# Patient Record
Sex: Female | Born: 1935
Health system: Southern US, Community
[De-identification: ages and names within clinical notes are randomized; demographics above are authoritative.]

## PROBLEM LIST (undated history)

## (undated) DIAGNOSIS — I4891 Unspecified atrial fibrillation: Secondary | ICD-10-CM

## (undated) DIAGNOSIS — I10 Essential (primary) hypertension: Secondary | ICD-10-CM

## (undated) DIAGNOSIS — R079 Chest pain, unspecified: Secondary | ICD-10-CM

## (undated) DIAGNOSIS — I071 Rheumatic tricuspid insufficiency: Secondary | ICD-10-CM

## (undated) DIAGNOSIS — M1990 Unspecified osteoarthritis, unspecified site: Secondary | ICD-10-CM

## (undated) DIAGNOSIS — I493 Ventricular premature depolarization: Secondary | ICD-10-CM

## (undated) DIAGNOSIS — G4733 Obstructive sleep apnea (adult) (pediatric): Secondary | ICD-10-CM

## (undated) DIAGNOSIS — E785 Hyperlipidemia, unspecified: Secondary | ICD-10-CM

## (undated) DIAGNOSIS — Z972 Presence of dental prosthetic device (complete) (partial): Secondary | ICD-10-CM

## (undated) DIAGNOSIS — E119 Type 2 diabetes mellitus without complications: Secondary | ICD-10-CM

## (undated) DIAGNOSIS — I5032 Chronic diastolic (congestive) heart failure: Secondary | ICD-10-CM

## (undated) DIAGNOSIS — I480 Paroxysmal atrial fibrillation: Secondary | ICD-10-CM

## (undated) DIAGNOSIS — I495 Sick sinus syndrome: Secondary | ICD-10-CM

## (undated) DIAGNOSIS — Z8719 Personal history of other diseases of the digestive system: Secondary | ICD-10-CM

## (undated) DIAGNOSIS — I272 Pulmonary hypertension, unspecified: Secondary | ICD-10-CM

## (undated) DIAGNOSIS — Z87440 Personal history of urinary (tract) infections: Secondary | ICD-10-CM

## (undated) DIAGNOSIS — F419 Anxiety disorder, unspecified: Secondary | ICD-10-CM

## (undated) DIAGNOSIS — E079 Disorder of thyroid, unspecified: Secondary | ICD-10-CM

## (undated) DIAGNOSIS — R55 Syncope and collapse: Secondary | ICD-10-CM

## (undated) DIAGNOSIS — R42 Dizziness and giddiness: Secondary | ICD-10-CM

## (undated) DIAGNOSIS — K219 Gastro-esophageal reflux disease without esophagitis: Secondary | ICD-10-CM

## (undated) DIAGNOSIS — H409 Unspecified glaucoma: Secondary | ICD-10-CM

## (undated) DIAGNOSIS — M109 Gout, unspecified: Secondary | ICD-10-CM

## (undated) DIAGNOSIS — I340 Nonrheumatic mitral (valve) insufficiency: Secondary | ICD-10-CM

## (undated) DIAGNOSIS — K635 Polyp of colon: Secondary | ICD-10-CM

## (undated) DIAGNOSIS — N184 Chronic kidney disease, stage 4 (severe): Secondary | ICD-10-CM

## (undated) DIAGNOSIS — I440 Atrioventricular block, first degree: Secondary | ICD-10-CM

## (undated) DIAGNOSIS — D649 Anemia, unspecified: Secondary | ICD-10-CM

## (undated) DIAGNOSIS — J302 Other seasonal allergic rhinitis: Secondary | ICD-10-CM

## (undated) DIAGNOSIS — Z950 Presence of cardiac pacemaker: Secondary | ICD-10-CM

## (undated) DIAGNOSIS — N189 Chronic kidney disease, unspecified: Secondary | ICD-10-CM

## (undated) DIAGNOSIS — I509 Heart failure, unspecified: Secondary | ICD-10-CM

## (undated) DIAGNOSIS — Z9989 Dependence on other enabling machines and devices: Secondary | ICD-10-CM

## (undated) HISTORY — PX: INSERT / REPLACE / REMOVE PACEMAKER: SUR710

## (undated) HISTORY — DX: Ventricular premature depolarization: I49.3

## (undated) HISTORY — DX: Sick sinus syndrome: I49.5

## (undated) HISTORY — DX: Polyp of colon: K63.5

## (undated) HISTORY — DX: Morbid (severe) obesity due to excess calories: E66.01

## (undated) HISTORY — DX: Unspecified glaucoma: H40.9

## (undated) HISTORY — DX: Disorder of thyroid, unspecified: E07.9

## (undated) HISTORY — PX: APPENDECTOMY: SHX54

## (undated) HISTORY — DX: Paroxysmal atrial fibrillation: I48.0

## (undated) HISTORY — DX: Gastro-esophageal reflux disease without esophagitis: K21.9

## (undated) HISTORY — DX: Atrioventricular block, first degree: I44.0

## (undated) HISTORY — DX: Chest pain, unspecified: R07.9

## (undated) HISTORY — DX: Heart failure, unspecified: I50.9

## (undated) HISTORY — DX: Syncope and collapse: R55

## (undated) HISTORY — DX: Hyperlipidemia, unspecified: E78.5

## (undated) HISTORY — DX: Chronic diastolic (congestive) heart failure: I50.32

## (undated) HISTORY — DX: Chronic kidney disease, unspecified: N18.9

## (undated) HISTORY — PX: DILATION AND CURETTAGE OF UTERUS: SHX78

## (undated) HISTORY — DX: Personal history of urinary (tract) infections: Z87.440

## (undated) HISTORY — PX: CARDIAC CATHETERIZATION: SHX172

## (undated) HISTORY — DX: Essential (primary) hypertension: I10

---

## 1968-12-04 HISTORY — PX: VAGINAL HYSTERECTOMY: SUR661

## 1998-03-04 HISTORY — PX: INCISION AND DRAINAGE OF WOUND: SHX1803

## 2004-07-24 ENCOUNTER — Other Ambulatory Visit: Payer: Self-pay

## 2005-01-02 ENCOUNTER — Ambulatory Visit: Payer: Self-pay | Admitting: Unknown Physician Specialty

## 2005-04-11 ENCOUNTER — Ambulatory Visit: Payer: Self-pay | Admitting: Family Medicine

## 2005-10-13 ENCOUNTER — Ambulatory Visit: Payer: Self-pay | Admitting: Unknown Physician Specialty

## 2005-10-20 ENCOUNTER — Ambulatory Visit: Payer: Self-pay | Admitting: Unknown Physician Specialty

## 2005-11-03 ENCOUNTER — Ambulatory Visit: Payer: Self-pay | Admitting: Family Medicine

## 2005-11-16 ENCOUNTER — Ambulatory Visit: Payer: Self-pay | Admitting: Internal Medicine

## 2005-12-04 ENCOUNTER — Ambulatory Visit: Payer: Self-pay | Admitting: Internal Medicine

## 2005-12-18 ENCOUNTER — Ambulatory Visit: Payer: Self-pay | Admitting: Family Medicine

## 2005-12-26 ENCOUNTER — Ambulatory Visit: Payer: Self-pay | Admitting: Family Medicine

## 2006-01-08 ENCOUNTER — Ambulatory Visit: Payer: Self-pay | Admitting: Internal Medicine

## 2006-02-01 ENCOUNTER — Ambulatory Visit: Payer: Self-pay | Admitting: Internal Medicine

## 2006-03-04 ENCOUNTER — Ambulatory Visit: Payer: Self-pay | Admitting: Internal Medicine

## 2006-04-09 ENCOUNTER — Ambulatory Visit: Payer: Self-pay | Admitting: Internal Medicine

## 2006-05-03 ENCOUNTER — Ambulatory Visit: Payer: Self-pay | Admitting: Family Medicine

## 2006-05-21 ENCOUNTER — Ambulatory Visit: Payer: Self-pay | Admitting: Specialist

## 2006-06-04 ENCOUNTER — Ambulatory Visit: Payer: Self-pay | Admitting: Internal Medicine

## 2006-06-11 ENCOUNTER — Ambulatory Visit: Payer: Self-pay | Admitting: Internal Medicine

## 2006-06-15 ENCOUNTER — Ambulatory Visit: Payer: Self-pay | Admitting: Internal Medicine

## 2006-06-26 ENCOUNTER — Ambulatory Visit: Payer: Self-pay | Admitting: Internal Medicine

## 2006-07-04 ENCOUNTER — Ambulatory Visit: Payer: Self-pay | Admitting: Internal Medicine

## 2006-07-05 ENCOUNTER — Other Ambulatory Visit: Payer: Self-pay

## 2006-07-05 ENCOUNTER — Emergency Department: Payer: Self-pay | Admitting: Emergency Medicine

## 2006-08-12 ENCOUNTER — Emergency Department: Payer: Self-pay | Admitting: Emergency Medicine

## 2006-08-23 ENCOUNTER — Ambulatory Visit: Payer: Self-pay | Admitting: Specialist

## 2006-08-29 ENCOUNTER — Ambulatory Visit: Payer: Self-pay | Admitting: Internal Medicine

## 2006-09-03 ENCOUNTER — Ambulatory Visit: Payer: Self-pay | Admitting: Internal Medicine

## 2006-11-13 ENCOUNTER — Ambulatory Visit: Payer: Self-pay

## 2006-12-18 ENCOUNTER — Ambulatory Visit: Payer: Self-pay | Admitting: Unknown Physician Specialty

## 2006-12-20 ENCOUNTER — Inpatient Hospital Stay: Payer: Self-pay | Admitting: *Deleted

## 2006-12-20 ENCOUNTER — Other Ambulatory Visit: Payer: Self-pay

## 2006-12-21 ENCOUNTER — Other Ambulatory Visit: Payer: Self-pay

## 2007-02-14 ENCOUNTER — Encounter (HOSPITAL_BASED_OUTPATIENT_CLINIC_OR_DEPARTMENT_OTHER): Admission: RE | Admit: 2007-02-14 | Discharge: 2007-05-15 | Payer: Self-pay | Admitting: Surgery

## 2007-02-25 ENCOUNTER — Ambulatory Visit: Payer: Self-pay | Admitting: Internal Medicine

## 2007-02-26 ENCOUNTER — Ambulatory Visit: Payer: Self-pay | Admitting: Internal Medicine

## 2007-03-05 ENCOUNTER — Ambulatory Visit: Payer: Self-pay | Admitting: Internal Medicine

## 2007-06-27 ENCOUNTER — Ambulatory Visit: Payer: Self-pay | Admitting: Family Medicine

## 2007-08-05 ENCOUNTER — Ambulatory Visit: Payer: Self-pay | Admitting: Internal Medicine

## 2007-08-26 ENCOUNTER — Ambulatory Visit: Payer: Self-pay | Admitting: Internal Medicine

## 2007-09-04 ENCOUNTER — Ambulatory Visit: Payer: Self-pay | Admitting: Internal Medicine

## 2008-01-07 ENCOUNTER — Emergency Department: Payer: Self-pay | Admitting: Emergency Medicine

## 2008-01-07 ENCOUNTER — Other Ambulatory Visit: Payer: Self-pay

## 2008-02-25 ENCOUNTER — Ambulatory Visit: Payer: Self-pay | Admitting: Internal Medicine

## 2008-05-25 ENCOUNTER — Emergency Department: Payer: Self-pay | Admitting: Emergency Medicine

## 2008-05-25 ENCOUNTER — Other Ambulatory Visit: Payer: Self-pay

## 2008-05-27 ENCOUNTER — Ambulatory Visit: Payer: Self-pay | Admitting: Internal Medicine

## 2008-06-01 ENCOUNTER — Ambulatory Visit: Payer: Self-pay | Admitting: Internal Medicine

## 2008-06-03 ENCOUNTER — Ambulatory Visit: Payer: Self-pay | Admitting: Internal Medicine

## 2008-06-09 ENCOUNTER — Ambulatory Visit: Payer: Self-pay | Admitting: Internal Medicine

## 2008-06-17 ENCOUNTER — Ambulatory Visit: Payer: Self-pay | Admitting: Internal Medicine

## 2008-07-04 ENCOUNTER — Ambulatory Visit: Payer: Self-pay | Admitting: Internal Medicine

## 2008-07-16 ENCOUNTER — Ambulatory Visit: Payer: Self-pay | Admitting: Family Medicine

## 2008-08-04 ENCOUNTER — Ambulatory Visit: Payer: Self-pay | Admitting: Internal Medicine

## 2008-08-05 ENCOUNTER — Ambulatory Visit: Payer: Self-pay | Admitting: Unknown Physician Specialty

## 2008-09-03 ENCOUNTER — Ambulatory Visit: Payer: Self-pay | Admitting: Internal Medicine

## 2008-09-07 ENCOUNTER — Ambulatory Visit: Payer: Self-pay | Admitting: Internal Medicine

## 2008-09-09 ENCOUNTER — Ambulatory Visit: Payer: Self-pay | Admitting: Cardiovascular Disease

## 2008-09-21 ENCOUNTER — Ambulatory Visit: Payer: Self-pay | Admitting: Internal Medicine

## 2008-10-04 ENCOUNTER — Ambulatory Visit: Payer: Self-pay | Admitting: Internal Medicine

## 2008-10-05 ENCOUNTER — Ambulatory Visit: Payer: Self-pay | Admitting: Cardiovascular Disease

## 2008-10-19 ENCOUNTER — Ambulatory Visit: Payer: Self-pay | Admitting: Internal Medicine

## 2008-11-03 ENCOUNTER — Ambulatory Visit: Payer: Self-pay | Admitting: Internal Medicine

## 2008-11-13 ENCOUNTER — Emergency Department: Payer: Self-pay | Admitting: Emergency Medicine

## 2008-11-16 ENCOUNTER — Ambulatory Visit: Payer: Self-pay | Admitting: Internal Medicine

## 2008-11-18 ENCOUNTER — Ambulatory Visit: Payer: Self-pay | Admitting: Internal Medicine

## 2008-12-04 HISTORY — PX: LUNG BIOPSY: SHX232

## 2008-12-07 ENCOUNTER — Ambulatory Visit: Payer: Self-pay | Admitting: Internal Medicine

## 2008-12-07 LAB — CONVERTED CEMR LAB
INR: 2.5 — ABNORMAL HIGH (ref 0.0–1.5)
Prothrombin Time: 29 s — ABNORMAL HIGH (ref 11.6–15.2)

## 2009-01-05 ENCOUNTER — Ambulatory Visit: Payer: Self-pay | Admitting: Internal Medicine

## 2009-01-05 LAB — CONVERTED CEMR LAB
INR: 3.7 — ABNORMAL HIGH (ref 0.0–1.5)
Prothrombin Time: 39.5 s — ABNORMAL HIGH (ref 11.6–15.2)

## 2009-01-25 ENCOUNTER — Ambulatory Visit: Payer: Self-pay | Admitting: Internal Medicine

## 2009-01-29 ENCOUNTER — Encounter: Payer: Self-pay | Admitting: Internal Medicine

## 2009-02-01 ENCOUNTER — Ambulatory Visit: Payer: Self-pay | Admitting: Internal Medicine

## 2009-02-01 LAB — CONVERTED CEMR LAB
INR: 1.9 — ABNORMAL HIGH (ref 0.0–1.5)
Prothrombin Time: 22.9 s — ABNORMAL HIGH (ref 11.6–15.2)

## 2009-02-15 ENCOUNTER — Ambulatory Visit: Payer: Self-pay | Admitting: Internal Medicine

## 2009-02-15 LAB — CONVERTED CEMR LAB
INR: 2.7 — ABNORMAL HIGH (ref 0.0–1.5)
Prothrombin Time: 30.7 s — ABNORMAL HIGH (ref 11.6–15.2)

## 2009-03-15 ENCOUNTER — Ambulatory Visit: Payer: Self-pay | Admitting: Internal Medicine

## 2009-03-15 LAB — CONVERTED CEMR LAB
INR: 2.1 — ABNORMAL HIGH (ref 0.0–1.5)
Prothrombin Time: 25 s — ABNORMAL HIGH (ref 11.6–15.2)

## 2009-04-12 ENCOUNTER — Ambulatory Visit: Payer: Self-pay | Admitting: Cardiovascular Disease

## 2009-04-13 ENCOUNTER — Ambulatory Visit: Payer: Self-pay | Admitting: Cardiovascular Disease

## 2009-04-13 DIAGNOSIS — E1129 Type 2 diabetes mellitus with other diabetic kidney complication: Secondary | ICD-10-CM | POA: Insufficient documentation

## 2009-04-13 DIAGNOSIS — E119 Type 2 diabetes mellitus without complications: Secondary | ICD-10-CM | POA: Insufficient documentation

## 2009-04-13 DIAGNOSIS — K219 Gastro-esophageal reflux disease without esophagitis: Secondary | ICD-10-CM | POA: Insufficient documentation

## 2009-04-13 DIAGNOSIS — I10 Essential (primary) hypertension: Secondary | ICD-10-CM | POA: Insufficient documentation

## 2009-04-13 DIAGNOSIS — I480 Paroxysmal atrial fibrillation: Secondary | ICD-10-CM | POA: Insufficient documentation

## 2009-04-13 DIAGNOSIS — H409 Unspecified glaucoma: Secondary | ICD-10-CM | POA: Insufficient documentation

## 2009-04-13 DIAGNOSIS — I495 Sick sinus syndrome: Secondary | ICD-10-CM | POA: Insufficient documentation

## 2009-04-13 DIAGNOSIS — Z950 Presence of cardiac pacemaker: Secondary | ICD-10-CM | POA: Insufficient documentation

## 2009-04-13 LAB — CONVERTED CEMR LAB
INR: 2.2 — ABNORMAL HIGH (ref 0.0–1.5)
Prothrombin Time: 25.4 s — ABNORMAL HIGH (ref 11.6–15.2)

## 2009-04-19 ENCOUNTER — Ambulatory Visit: Payer: Self-pay | Admitting: Cardiovascular Disease

## 2009-04-28 ENCOUNTER — Telehealth: Payer: Self-pay | Admitting: Internal Medicine

## 2009-05-11 ENCOUNTER — Ambulatory Visit: Payer: Self-pay | Admitting: Cardiology

## 2009-05-12 LAB — CONVERTED CEMR LAB
INR: 2.1 — ABNORMAL HIGH (ref 0.0–1.5)
Prothrombin Time: 25.2 s — ABNORMAL HIGH (ref 11.6–15.2)

## 2009-05-21 ENCOUNTER — Telehealth: Payer: Self-pay | Admitting: Internal Medicine

## 2009-05-27 ENCOUNTER — Telehealth: Payer: Self-pay | Admitting: Internal Medicine

## 2009-05-31 ENCOUNTER — Encounter: Payer: Self-pay | Admitting: Internal Medicine

## 2009-05-31 ENCOUNTER — Ambulatory Visit: Payer: Self-pay | Admitting: Internal Medicine

## 2009-06-08 ENCOUNTER — Ambulatory Visit: Payer: Self-pay | Admitting: Cardiovascular Disease

## 2009-06-10 LAB — CONVERTED CEMR LAB
INR: 2.8 — ABNORMAL HIGH (ref 0.0–1.5)
Prothrombin Time: 31.6 s — ABNORMAL HIGH (ref 11.6–15.2)

## 2009-07-05 ENCOUNTER — Telehealth: Payer: Self-pay | Admitting: Internal Medicine

## 2009-07-06 ENCOUNTER — Ambulatory Visit: Payer: Self-pay | Admitting: Cardiology

## 2009-07-07 LAB — CONVERTED CEMR LAB
INR: 2.8 — ABNORMAL HIGH (ref 0.0–1.5)
Prothrombin Time: 31.8 s — ABNORMAL HIGH (ref 11.6–15.2)

## 2009-07-19 ENCOUNTER — Encounter: Payer: Self-pay | Admitting: *Deleted

## 2009-08-03 ENCOUNTER — Ambulatory Visit: Payer: Self-pay | Admitting: Cardiology

## 2009-08-04 ENCOUNTER — Encounter: Payer: Self-pay | Admitting: Internal Medicine

## 2009-08-04 LAB — CONVERTED CEMR LAB
INR: 2.9
Prothrombin Time: 29.9 s

## 2009-08-06 LAB — CONVERTED CEMR LAB
INR: 2.9 — ABNORMAL HIGH (ref 0.0–1.5)
Prothrombin Time: 29.9 s — ABNORMAL HIGH (ref 11.6–15.2)

## 2009-08-10 ENCOUNTER — Ambulatory Visit: Payer: Self-pay | Admitting: Family Medicine

## 2009-08-23 ENCOUNTER — Ambulatory Visit: Payer: Self-pay | Admitting: Internal Medicine

## 2009-08-23 DIAGNOSIS — R609 Edema, unspecified: Secondary | ICD-10-CM | POA: Insufficient documentation

## 2009-08-28 ENCOUNTER — Emergency Department: Payer: Self-pay | Admitting: Internal Medicine

## 2009-08-30 ENCOUNTER — Emergency Department: Payer: Self-pay | Admitting: Emergency Medicine

## 2009-08-31 ENCOUNTER — Ambulatory Visit: Payer: Self-pay | Admitting: Cardiovascular Disease

## 2009-08-31 LAB — CONVERTED CEMR LAB
INR: 3.3
Prothrombin Time: 33.3 s

## 2009-09-01 LAB — CONVERTED CEMR LAB
INR: 3.3 — ABNORMAL HIGH (ref 0.0–1.5)
Prothrombin Time: 33.3 s — ABNORMAL HIGH (ref 11.6–15.2)

## 2009-09-13 ENCOUNTER — Ambulatory Visit: Payer: Self-pay | Admitting: Internal Medicine

## 2009-09-13 DIAGNOSIS — I4891 Unspecified atrial fibrillation: Secondary | ICD-10-CM | POA: Insufficient documentation

## 2009-09-13 DIAGNOSIS — IMO0002 Reserved for concepts with insufficient information to code with codable children: Secondary | ICD-10-CM | POA: Insufficient documentation

## 2009-09-13 DIAGNOSIS — I493 Ventricular premature depolarization: Secondary | ICD-10-CM | POA: Insufficient documentation

## 2009-09-28 ENCOUNTER — Ambulatory Visit: Payer: Self-pay | Admitting: Cardiology

## 2009-09-28 LAB — CONVERTED CEMR LAB: INR: 2.68

## 2009-09-29 LAB — CONVERTED CEMR LAB
INR: 2.68 — ABNORMAL HIGH (ref <1.50)
Prothrombin Time: 28.3 s — ABNORMAL HIGH (ref 11.6–15.2)

## 2009-11-02 ENCOUNTER — Telehealth: Payer: Self-pay | Admitting: Internal Medicine

## 2009-11-11 ENCOUNTER — Ambulatory Visit: Payer: Self-pay

## 2009-11-12 ENCOUNTER — Encounter: Payer: Self-pay | Admitting: Cardiology

## 2009-11-12 LAB — CONVERTED CEMR LAB
INR: 2.72 — ABNORMAL HIGH (ref <1.50)
INR: 2.72
Prothrombin Time: 28.6 s — ABNORMAL HIGH (ref 11.6–15.2)
Prothrombin Time: 28.6 s

## 2009-12-03 ENCOUNTER — Emergency Department: Payer: Self-pay | Admitting: Emergency Medicine

## 2009-12-09 ENCOUNTER — Ambulatory Visit: Payer: Self-pay | Admitting: Cardiology

## 2009-12-10 ENCOUNTER — Encounter: Payer: Self-pay | Admitting: Cardiology

## 2009-12-10 LAB — CONVERTED CEMR LAB
INR: 2.78 — ABNORMAL HIGH (ref <1.50)
INR: 2.78
Prothrombin Time: 29.1 s — ABNORMAL HIGH (ref 11.6–15.2)
Prothrombin Time: 29.1 s

## 2009-12-31 ENCOUNTER — Telehealth: Payer: Self-pay | Admitting: Internal Medicine

## 2010-01-05 ENCOUNTER — Ambulatory Visit: Payer: Self-pay | Admitting: Cardiovascular Disease

## 2010-01-05 LAB — CONVERTED CEMR LAB
INR: 2.42 — ABNORMAL HIGH (ref <1.50)
Prothrombin Time: 26.1 s — ABNORMAL HIGH (ref 11.6–15.2)

## 2010-01-07 ENCOUNTER — Encounter: Payer: Self-pay | Admitting: Cardiovascular Disease

## 2010-01-07 ENCOUNTER — Telehealth: Payer: Self-pay | Admitting: Internal Medicine

## 2010-01-07 LAB — CONVERTED CEMR LAB
INR: 2.42
Prothrombin Time: 26.1 s

## 2010-01-17 ENCOUNTER — Ambulatory Visit: Payer: Self-pay | Admitting: Internal Medicine

## 2010-01-25 ENCOUNTER — Ambulatory Visit: Payer: Self-pay | Admitting: Internal Medicine

## 2010-01-28 ENCOUNTER — Telehealth: Payer: Self-pay | Admitting: Cardiovascular Disease

## 2010-01-28 ENCOUNTER — Ambulatory Visit: Payer: Self-pay | Admitting: Cardiovascular Disease

## 2010-01-28 DIAGNOSIS — R0602 Shortness of breath: Secondary | ICD-10-CM | POA: Insufficient documentation

## 2010-02-01 ENCOUNTER — Telehealth: Payer: Self-pay | Admitting: Cardiovascular Disease

## 2010-02-02 ENCOUNTER — Telehealth: Payer: Self-pay | Admitting: Internal Medicine

## 2010-02-03 ENCOUNTER — Ambulatory Visit: Payer: Self-pay | Admitting: Cardiovascular Disease

## 2010-02-09 ENCOUNTER — Encounter: Payer: Self-pay | Admitting: Cardiovascular Disease

## 2010-02-09 ENCOUNTER — Ambulatory Visit: Payer: Self-pay | Admitting: Cardiovascular Disease

## 2010-02-10 ENCOUNTER — Ambulatory Visit: Payer: Self-pay | Admitting: Cardiovascular Disease

## 2010-02-11 ENCOUNTER — Encounter: Payer: Self-pay | Admitting: Internal Medicine

## 2010-02-11 LAB — CONVERTED CEMR LAB
INR: 2.79
INR: 2.79 — ABNORMAL HIGH (ref <1.50)
Prothrombin Time: 29.2 s
Prothrombin Time: 29.2 s — ABNORMAL HIGH (ref 11.6–15.2)

## 2010-03-10 ENCOUNTER — Ambulatory Visit: Payer: Self-pay | Admitting: Internal Medicine

## 2010-03-14 ENCOUNTER — Ambulatory Visit: Payer: Self-pay | Admitting: Internal Medicine

## 2010-03-14 LAB — CONVERTED CEMR LAB
INR: 2.6
INR: 2.6 — ABNORMAL HIGH (ref <1.50)
Prothrombin Time: 27.6 s
Prothrombin Time: 27.6 s — ABNORMAL HIGH (ref 11.6–15.2)

## 2010-03-15 ENCOUNTER — Ambulatory Visit: Payer: Self-pay

## 2010-03-15 ENCOUNTER — Encounter: Payer: Self-pay | Admitting: Internal Medicine

## 2010-03-23 ENCOUNTER — Telehealth: Payer: Self-pay | Admitting: Internal Medicine

## 2010-03-28 ENCOUNTER — Ambulatory Visit: Payer: Self-pay | Admitting: Internal Medicine

## 2010-03-28 ENCOUNTER — Ambulatory Visit (HOSPITAL_COMMUNITY): Admission: RE | Admit: 2010-03-28 | Discharge: 2010-03-28 | Payer: Self-pay | Admitting: Internal Medicine

## 2010-03-28 ENCOUNTER — Encounter: Payer: Self-pay | Admitting: Internal Medicine

## 2010-04-11 ENCOUNTER — Telehealth: Payer: Self-pay | Admitting: Internal Medicine

## 2010-04-13 ENCOUNTER — Ambulatory Visit: Payer: Self-pay | Admitting: Cardiovascular Disease

## 2010-04-13 LAB — CONVERTED CEMR LAB: POC INR: 2.8

## 2010-04-14 ENCOUNTER — Encounter: Payer: Self-pay | Admitting: Cardiovascular Disease

## 2010-04-25 ENCOUNTER — Ambulatory Visit: Payer: Self-pay

## 2010-04-28 ENCOUNTER — Ambulatory Visit: Payer: Self-pay | Admitting: Internal Medicine

## 2010-04-28 ENCOUNTER — Telehealth: Payer: Self-pay | Admitting: Internal Medicine

## 2010-05-03 ENCOUNTER — Encounter: Payer: Self-pay | Admitting: Cardiovascular Disease

## 2010-05-13 ENCOUNTER — Ambulatory Visit: Payer: Self-pay | Admitting: Cardiovascular Disease

## 2010-05-13 LAB — CONVERTED CEMR LAB
INR: 4.09 — ABNORMAL HIGH (ref <1.50)
POC INR: 4.09
Prothrombin Time: 39.4 s
Prothrombin Time: 39.4 s — ABNORMAL HIGH (ref 11.6–15.2)

## 2010-05-16 ENCOUNTER — Encounter: Payer: Self-pay | Admitting: Cardiovascular Disease

## 2010-05-26 ENCOUNTER — Ambulatory Visit: Payer: Self-pay | Admitting: Cardiovascular Disease

## 2010-05-27 LAB — CONVERTED CEMR LAB
INR: 2.89 — ABNORMAL HIGH (ref <1.50)
POC INR: 2.89
Prothrombin Time: 30 s
Prothrombin Time: 30 s — ABNORMAL HIGH (ref 11.6–15.2)

## 2010-06-02 ENCOUNTER — Encounter: Payer: Self-pay | Admitting: Cardiovascular Disease

## 2010-06-15 ENCOUNTER — Encounter: Payer: Self-pay | Admitting: Cardiovascular Disease

## 2010-06-15 ENCOUNTER — Telehealth: Payer: Self-pay | Admitting: Cardiovascular Disease

## 2010-07-22 ENCOUNTER — Emergency Department: Payer: Self-pay | Admitting: Emergency Medicine

## 2010-08-29 ENCOUNTER — Encounter: Payer: Self-pay | Admitting: Cardiovascular Disease

## 2010-09-18 ENCOUNTER — Emergency Department: Payer: Self-pay | Admitting: Emergency Medicine

## 2010-09-28 ENCOUNTER — Ambulatory Visit: Payer: Self-pay | Admitting: Family Medicine

## 2010-09-30 ENCOUNTER — Encounter (INDEPENDENT_AMBULATORY_CARE_PROVIDER_SITE_OTHER): Payer: Self-pay | Admitting: *Deleted

## 2010-10-03 ENCOUNTER — Ambulatory Visit: Payer: Self-pay | Admitting: Cardiovascular Disease

## 2010-12-20 ENCOUNTER — Telehealth (INDEPENDENT_AMBULATORY_CARE_PROVIDER_SITE_OTHER): Payer: Self-pay | Admitting: *Deleted

## 2011-01-03 NOTE — Medication Information (Signed)
Summary: CCR/AMD  Anticoagulant Therapy  Managed by: Freddrick March, RN, BSN PCP: Maryland Pink, MD Supervising MD: Rockey Situ Indication 1: Atrial Fibrillation (ICD-427.31) Lab Used: [facility] Site: [place] PT 39.4 INR POC 4.09 INR RANGE 2.0-3.0   Health status changes: yes       Details: Pt states she had stomach virus, diarrhea    Any changes in medication regimen? no     Any missed doses?: no       Is patient compliant with meds? yes       Allergies: No Known Drug Allergies  Anticoagulation Management History:      Her anticoagulation is being managed by telephone today.  Positive risk factors for bleeding include an age of 74 years or older and presence of serious comorbidities.  The bleeding index is 'intermediate risk'.  Positive CHADS2 values include History of HTN and History of Diabetes.  Negative CHADS2 values include Age > 96 years old.  The start date was 09/21/2008.  Her last INR was 2.60.  Prothrombin time is 39.4.  Anticoagulation responsible provider: Aleph Nickson.  INR POC: 4.09.  Exp: 07/2011.    Anticoagulation Management Assessment/Plan:      The patient's current anticoagulation dose is Coumadin 4 mg tabs: 1 by mouth once daily as directed by coumadin clinic.  The target INR is 2 - 3.  The next INR is due 05/26/2010.  Anticoagulation instructions were given to patient.  Results were reviewed/authorized by Freddrick March, RN, BSN.  She was notified by Freddrick March RN.         Prior Anticoagulation Instructions: INR 2.8  Continue on same dosage 4mg  daily.  Recheck in 4 weeks.    Current Anticoagulation Instructions: INR 4.09  Called spoke with pt, advised to hold x 1 dosage tonight, take 2mg  tomorrow, then resume same dosage 4mg  daily.   Recheck in 2 weeks.  Appt made.

## 2011-01-03 NOTE — Medication Information (Signed)
Summary: CCR  Anticoagulant Therapy  Managed by: Freddrick March, RN, BSN PCP: Maryland Pink, MD Supervising MD: Rockey Situ Indication 1: Atrial Fibrillation (ICD-427.31) Lab Used: [facility] Site: [place] INR POC 2.8 INR RANGE 2.0-3.0    Bleeding/hemorrhagic complications: no     Any changes in medication regimen? no     Any missed doses?: no         Allergies: No Known Drug Allergies  Anticoagulation Management History:      The patient is taking warfarin and comes in today for a routine follow up visit.  Positive risk factors for bleeding include an age of 76 years or older and presence of serious comorbidities.  The bleeding index is 'intermediate risk'.  Positive CHADS2 values include History of HTN and History of Diabetes.  Negative CHADS2 values include Age > 55 years old.  The start date was 09/21/2008.  Her last INR was 2.60.  Anticoagulation responsible provider: [place].  INR POC: 2.8.  Cuvette Lot#: [id number].  Exp: 07/2011.    Anticoagulation Management Assessment/Plan:      The patient's current anticoagulation dose is Coumadin 4 mg tabs: 1 by mouth once daily as directed by coumadin clinic.  The target INR is 2 - 3.  The next INR is due 05/11/2010.  Anticoagulation instructions were given to patient.  Results were reviewed/authorized by Freddrick March, RN, BSN.  She was notified by Freddrick March RN.         Prior Anticoagulation Instructions: The patient is to continue with the same dose of coumadin.  This dosage includes: coumadin 4mg  daily  Current Anticoagulation Instructions: INR 2.8  Continue on same dosage 4mg  daily.  Recheck in 4 weeks.

## 2011-01-03 NOTE — Assessment & Plan Note (Signed)
Summary: SOB  Medications Added NEXIUM 40 MG CPDR (ESOMEPRAZOLE MAGNESIUM) 1 tab two times a day      Allergies Added: NKDA  Visit Type:  Follow-up Primary Provider:  Maryland Pink, MD  CC:  sob.  History of Present Illness:   [name] [name] is seen in followup for symptomatic palpitations including atrial fibrillation and PVCs. She is status post pacemaker implantation.  At a recent visit we tried to ameliorate symptoms by increasing resting rate to 75 bpm  this was not successful.  She describes episodes of palpitations mostly at night, and last ing  for a while, notwithstanding the evidence from our interrogations being of durationless than 2 min   at her last visit we try to further address her symptoms by referring her heart rate from 80-70 and increasing her rate response from 5-6.  However, she comes in today complaining of significant worsening of her symptoms. This is manifested by both dyspnea on exertion as well as lightheadedness with bending and or standing. She also has a sensation that we are "testing her heartbeat"  She also complains of fatigue, and we note that she is on high dose metoprolol succinate.  Current Medications (verified): 1)  Metoprolol Succinate 100 Mg Xr24h-Tab (Metoprolol Succinate) .... Take One Tablet By Mouth Twice A Day 2)  Metformin Hcl 500 Mg Xr24h-Tab (Metformin Hcl) .Marland Kitchen.. 1 By Mouth Two Times A Day 3)  Nexium 40 Mg Cpdr (Esomeprazole Magnesium) .Marland Kitchen.. 1 Tab Two Times A Day 4)  Lipitor 20 Mg Tabs (Atorvastatin Calcium) .... Take One Tablet By Mouth Daily. 5)  Xalatan 0.005 % Soln (Latanoprost) .... At Bedtime 6)  Hematinic Plus Vit/minerals  Tabs (B Complex-C-Min-Fe-Fa) .... Once Daily 7)  Diltiazem Hcl Er Beads 240 Mg Xr24h-Cap (Diltiazem Hcl Er Beads) .... Take One Capsule By Mouth Daily 8)  Alprazolam 0.5 Mg Tabs (Alprazolam) .... 1/2 -1 As Needed 9)  Coumadin 4 Mg Tabs (Warfarin Sodium) .Marland Kitchen.. 1 By Mouth Once Daily As Directed By Coumadin Clinic 10)   Benicar Hct 40-12.5 Mg Tabs (Olmesartan Medoxomil-Hctz) .Marland Kitchen.. 1 By Mouth Once Daily  Allergies (verified): No Known Drug Allergies  Past History:  Past Medical History: Last updated: 04/13/2009 SICK SINUS/ TACHY-BRADY SYNDROME (ICD-427.81) ATRIAL FIBRILLATION (ICD-427.31) HYPERTENSION, UNSPECIFIED (ICD-401.9) PACEMAKER (ICD-V45.Marland Kitchen01) GERD (ICD-530.81) GLAUCOMA (ICD-365.9) DIABETES MELLITUS (ICD-250.00)    Past Surgical History: Last updated: 04/13/2009 hysterectomy - 1970 cyst on right leg - 03/1998 pacemaker - 2008  Family History: Last updated: 04/13/2009 Father: cardiac arrest  Social History: Last updated: 04/13/2009 Retired  Widowed  Tobacco Use - Former.  Alcohol Use - no Regular Exercise - yes Drug Use - no  Risk Factors: Exercise: yes (04/13/2009)  Risk Factors: Smoking Status: quit (04/13/2009)  Vital Signs:  Patient profile:   76 year old female Height:      65 inches Weight:      245 pounds BMI:     40.92 BP sitting:   140 / 78  (left arm) Cuff size:   large  Vitals Entered By: Lubertha Basque, CNA (January 25, 2010 11:47 AM)  Physical Exam  General:  The patient was alert and oriented in no acute distress. HEENT Normal.  Neck veins were flat, carotids were brisk.  Lungs were clear.  Heart sounds were regular without murmurs or gallops.  Abdomen was soft with active bowel sounds. There is no clubbing cyanosis or edema. Skin Warm and dry    PPM Specifications Following MD:  Virl Axe, MD  PPM Vendor:  [employer]tronic     PPM Model Number:  [id number]     PPM Serial Number:  [id number] PPM DOI:  12/20/2006      Lead 1    Location: [facility]     DOI: 12/20/2006     Model #: [id number]     Serial #: [id number]     Status: active Lead 2    Location: RV     DOI: 12/20/2006     Model #: [id number]     Serial #: [id number]     Status: active  Magnet Response Rate:  BOL 85 ERI  65  Indications:  Sick sinus syndrome +Coumadin   PPM Follow Up Pacer Dependent:   Yes      Episodes Coumadin:  Yes  Parameters Mode:  DDDR     Lower Rate Limit:  75     Upper Rate Limit:  100 Paced AV Delay:  340     Sensed AV Delay:  310  Impression & Recommendations:  Problem # 1:  SICK SINUS/ TACHY-BRADY SYNDROME (ICD-427.81) he presented with complaints as noted above. Reviewing her heart rate excursions it was very flat. I reprogrammed her heart rate response from 6-8. This was associated with a change in her maximal exertional heart rate going from 80-94. We did both of these walking casts in the office today. The difference in her symptoms was striking both to her as well as to me feeling really quite good in the latter situation. She then bent over and stood up and had no recurrence of her dizziness. She has had very little atrial fibrillation. I wonder further whether her rate control may not be excessive and we'll plan to think about stopping her Cardizem at her next visit. Her updated medication list for this problem includes:    Metoprolol Succinate 100 Mg Xr24h-tab (Metoprolol succinate) .Marland Kitchen... Take one tablet by mouth twice a day    Diltiazem Hcl Er Beads 240 Mg Xr24h-cap (Diltiazem hcl er beads) .Marland Kitchen... Take one capsule by mouth daily    Coumadin 4 Mg Tabs (Warfarin sodium) .Marland Kitchen... 1 by mouth once daily as directed by coumadin clinic  Problem # 2:  PACEMAKER (ICD-V45.Marland Kitchen01) Device parameters and data were reviewed and changes were made as noted above  Problem # 3:  PREMATURE VENTRICULAR CONTRACTIONS (ICD-427.69) Interrogation of her PVC burden from her pacemaker showed no significant change Her updated medication list for this problem includes:    Metoprolol Succinate 100 Mg Xr24h-tab (Metoprolol succinate) .Marland Kitchen... Take one tablet by mouth twice a day    Diltiazem Hcl Er Beads 240 Mg Xr24h-cap (Diltiazem hcl er beads) .Marland Kitchen... Take one capsule by mouth daily    Coumadin 4 Mg Tabs (Warfarin sodium) .Marland Kitchen... 1 by mouth once daily as directed by coumadin clinic  Problem #  4:  ATRIAL FIBRILLATION (ICD-427.31) interrogation of her device demonstrated only insignificant amounts of atrial fibrillation comprising a total of 9 seconds in the last week and 5 minutes in the last 4 month Her updated medication list for this problem includes:    Metoprolol Succinate 100 Mg Xr24h-tab (Metoprolol succinate) .Marland Kitchen... Take one tablet by mouth twice a day    Coumadin 4 Mg Tabs (Warfarin sodium) .Marland Kitchen... 1 by mouth once daily as directed by coumadin clinic

## 2011-01-03 NOTE — Miscellaneous (Signed)
Summary: Orders Update  Clinical Lists Changes  Orders: Added new Referral order of Sleep Disorder Referral (Sleep Disorder) - Signed

## 2011-01-03 NOTE — Progress Notes (Signed)
Summary: RESULTS   Phone Note Call from Patient Call back at Home Phone [phone]   Caller: SELF Call For: [name] [name] Summary of Call: WOULD LIKE ECHO RESULTS Initial call taken by: Zenovia Jarred,  March 23, 2010 2:42 PM  Follow-up for Phone Call        spoke with pt about results. Follow-up by: Philemon Kingdom,  March 24, 2010 8:17 AM

## 2011-01-03 NOTE — Assessment & Plan Note (Signed)
Summary: F1M/AMD  Medications Added METOPROLOL SUCCINATE 100 MG XR24H-TAB (METOPROLOL SUCCINATE) Take 1/2 tablet by mouth twice a day FUROSEMIDE 20 MG TABS (FUROSEMIDE) Take one tablet by mouth daily as needed      Allergies Added: NKDA  Visit Type:  Follow-up Referring Provider:  Caryl Comes Primary Provider:  Maryland Pink, MD  CC:  short of breath still.  History of Present Illness: [name] [name] is seen in followup for ahistory of atrial fibrillation, symptomatic palpitations, PVCs, status post pacemaker implantation for sick sinus syndrome/tachybradycardia syndrome with chronic symptoms of palpitations, shortness of breath with exertion which has been under the care of Dr. Rockey Situ   her cardiac workup in the past has included  cardiac catheterization in 2004, stress testing,   echo done recently shows normal systolic function, mild LVH, diastolic relaxation abnormality, mildly elevated right ventricular systolic pressures, mild MR.  She had a treadmill study showing normal blood pressure response to exercise, chronotropic incompetence with peak heart rate of 100 beats per minute, resting heart rate in the 70s.  She states that she continues to have shortness of breath typically with exertion. She has started walking on the track and going to the [facility] and can do exercise though it has to be at a low pace due to shortness of breath. She denies any significant tachycardia palpitations. no significant lower extremity edema.  Previous  PFTs  demonstrated extrinsic compression/restrictive physiology.    Current Problems (verified): 1)  Dyspnea  (ICD-786.05) 2)  Premature Ventricular Contractions  (ICD-427.69) 3)  Atrial Fibrillation  (ICD-427.31) 4)  Sick Sinus/ Tachy-brady Syndrome  (ICD-427.81) 5)  Edema  (ICD-782.3) 6)  Encounter For Long-term Use of Other Medications  (ICD-V58.69) 7)  Hypertension, Unspecified  (ICD-401.9) 8)  Pacemaker  (ICD-V45.Marland Kitchen01) 9)  Gerd  (ICD-530.81) 10)   Glaucoma  (ICD-365.9) 11)  Diabetes Mellitus  (ICD-250.00)  Current Medications (verified): 1)  Metoprolol Succinate 100 Mg Xr24h-Tab (Metoprolol Succinate) .... Take One Tablet By Mouth Twice A Day 2)  Metformin Hcl 500 Mg Xr24h-Tab (Metformin Hcl) .Marland Kitchen.. 1 By Mouth Two Times A Day 3)  Nexium 40 Mg Cpdr (Esomeprazole Magnesium) .Marland Kitchen.. 1 Tab Two Times A Day 4)  Lipitor 20 Mg Tabs (Atorvastatin Calcium) .... Take One Tablet By Mouth Daily. 5)  Travatan Z 0.004 % Soln (Travoprost) .... As Directed 6)  Hematinic Plus Vit/minerals  Tabs (B Complex-C-Min-Fe-Fa) .... Once Daily 7)  Diltiazem Hcl Er Beads 240 Mg Xr24h-Cap (Diltiazem Hcl Er Beads) .... Take One Capsule By Mouth Daily 8)  Alprazolam 0.5 Mg Tabs (Alprazolam) .... 1/2 -1 As Needed 9)  Coumadin 4 Mg Tabs (Warfarin Sodium) .Marland Kitchen.. 1 By Mouth Once Daily As Directed By Coumadin Clinic 10)  Benicar Hct 40-12.5 Mg Tabs (Olmesartan Medoxomil-Hctz) .Marland Kitchen.. 1 By Mouth Once Daily 11)  Fish Oil 1000 Mg Caps (Omega-3 Fatty Acids) .Marland Kitchen.. 1 By Mouth Once Daily 12)  Januvia 100 Mg Tabs (Sitagliptin Phosphate) .... Take 1 By Mouth Once Daily  Allergies (verified): No Known Drug Allergies  Past History:  Past Medical History: Last updated: 03/14/2010 SICK SINUS/ TACHY-BRADY SYNDROME (ICD-427.81) ATRIAL FIBRILLATION (ICD-427.31) HYPERTENSION, UNSPECIFIED (ICD-401.9) PACEMAKER (ICD-V45.Marland Kitchen01) GERD (ICD-530.81) GLAUCOMA (ICD-365.9) DIABETES MELLITUS (ICD-250.00 obstructive sleep apnea obesity Palpitations  Past Surgical History: Last updated: 04/13/2009 hysterectomy - 1970 cyst on right leg - 03/1998 pacemaker - 2008  Family History: Last updated: 04/13/2009 Father: cardiac arrest  Social History: Last updated: 04/13/2009 Retired  Widowed  Tobacco Use - Former.  Alcohol Use - no Regular Exercise -  yes Drug Use - no  Risk Factors: Exercise: yes (04/13/2009)  Risk Factors: Smoking Status: quit (04/13/2009)  Review of Systems       The  patient complains of dyspnea on exertion.  The patient denies fever, weight loss, weight gain, vision loss, decreased hearing, hoarseness, chest pain, syncope, peripheral edema, prolonged cough, abdominal pain, incontinence, muscle weakness, depression, and enlarged lymph nodes.    Vital Signs:  Patient profile:   76 year old female Height:      65 inches Weight:      246.25 pounds BMI:     41.13 Pulse rate:   78 / minute Pulse rhythm:   regular BP sitting:   120 / 70  (left arm) Cuff size:   large  Vitals Entered By: Philemon Kingdom (Apr 13, 2010 10:14 AM)  Physical Exam  General:  well-appearing woman in no apparent distress, HEENT exam is benign, oropharynx is clear, neck is supple with no JVP or carotid bruits, heart sounds are regular with S1-S2 and no murmurs appreciated, lungs are clear to auscultation with no wheezes or rales, abdominal exam is notable for obesity though otherwise benign, no significant lower extremity edema, neurologic exam is nonfocal, skin is warm and dry, pulses are equal and symmetrical in her upper and lower extremities.    PPM Specifications Following MD:  Virl Axe, MD     PPM Vendor:  [employer]     PPM Model Number:  [id number]     PPM Serial Number:  [id number] PPM DOI:  12/20/2006      Lead 1    Location: [facility]     DOI: 12/20/2006     Model #: [id number]     Serial #: [id number]     Status: active Lead 2    Location: [facility]     DOI: 12/20/2006     Model #: [id number]     Serial #: [id number]     Status: active  Magnet Response Rate:  BOL 85 ERI  65  Indications:  Sick sinus syndrome +Coumadin   PPM Follow Up Pacer Dependent:  Yes      Episodes Coumadin:  Yes  Parameters Mode:  DDDR     Lower Rate Limit:  75     Upper Rate Limit:  100 Paced AV Delay:  340     Sensed AV Delay:  310  Impression & Recommendations:  Problem # 1:  DYSPNEA (ICD-786.05) etiology of her dysrhythmia continues to be challenging.  Recent treadmill suggest chronotropic  incompetence with exertion. Rate adjustment of her pacer was suggested on the report. She states that the reason her metoprolol was increased from 50 mg to 100 mg b.i.d. was for blood pressure. We will decrease her metoprolol back to 50 mg b.i.d. in an effort to allow her some rate response. If she has palpitations or fibrillation, she could take an extra 50 mg p.r.n.  history of a possible at her high doses of medications is causing some of her symptoms. Dr. Caryl Comes to see if he has other ideas for possible medication options. Could she benefit from beta blockers with multi-or changing to sotalol. I will leave the medication choice to his discretion.  We will contact advanced wound care to determine if her CPAP can have a titration trial. She is coming of 13 mm of mercury this has not been reevaluated in several years time.  Given her mild pulmonary hypertension on echo with a right ventricular systolic pressure 37 mm of mercury,  have given her some Lasix to be taken several times per week.  I will have her followup with Dr. Caryl Comes to see if there is some changes that can be made to her pacer for rate responsiveness. Her updated medication list for this problem includes:    Metoprolol Succinate 100 Mg Xr24h-tab (Metoprolol succinate) .Marland Kitchen... Take 1/2 tablet by mouth twice a day    Diltiazem Hcl Er Beads 240 Mg Xr24h-cap (Diltiazem hcl er beads) .Marland Kitchen... Take one capsule by mouth daily    Benicar Hct 40-12.5 Mg Tabs (Olmesartan medoxomil-hctz) .Marland Kitchen... 1 by mouth once daily    Furosemide 20 Mg Tabs (Furosemide) .Marland Kitchen... Take one tablet by mouth daily as needed  Patient Instructions: 1)  Your physician recommends that you schedule a follow-up appointment with Dr Caryl Comes ASAP either in [place] or [place]. 2)  Your physician has recommended you make the following change in your medication: Decrease Metoprolol to 50mg  (1/2 tablet) two times a day, and start taking Lasix 20mg  once daily as  needed. Prescriptions: FUROSEMIDE 20 MG TABS (FUROSEMIDE) Take one tablet by mouth daily as needed  #30 x 6   Entered by:   Freddrick March RN   Authorized by:   Esmond Plants MD   Signed by:   Esmond Plants MD on 04/13/2010   Method used:   Electronically to        [facility] [phone]* (retail)       [address] [place], [place]  [address]       Ph: [phone] or [phone]       Fax: [phone]   RxID:   [id number]

## 2011-01-03 NOTE — Progress Notes (Signed)
Summary: Shortness of breath..   Phone Note Call from Patient   Caller: pt Call For: dr Caryl Comes Summary of Call: Pt called very short of breath, having episodes of heart beating irregular.  Called amber to see if she thought if it was her pacemaker. Amber said to send her to the ER to be checked out, may be fluid overload. Spoke with pt and dr Edilia Bo said to bring pt in and we would check her out.  Joelene Millin :) Initial call taken by: Philemon Kingdom,  January 28, 2010 4:35 PM

## 2011-01-03 NOTE — Progress Notes (Signed)
Summary: SHORTNESS OF BREATH  Phone Note Call from Patient   Caller: NIECE Summary of Call: CALLED AND STATED HER AUNT WAS HAVING SOB.  SHE IS TAKING HER TO THE EMERGENCY ROOM UNLESS SHE IS IMPROVED BY THE TIME THE DAUGHTER GETS THERE.  SHE WANTED THE OFFICE TO BE AWARE OF SAME.  NIECE CELL NUMBER [phone] Initial call taken by: Luana Shu,  Apr 28, 2010 2:17 PM  Follow-up for Phone Call        NIECE CALLED BACK AND PT IS GOING TO [place] OFFICE Follow-up by: Luana Shu,  Apr 28, 2010 2:37 PM

## 2011-01-03 NOTE — Progress Notes (Signed)
Summary: Results   Phone Note Call from Patient Call back at Home Phone [phone]   Caller: Patient Call For: Caryl Comes Summary of Call: Patient would like to get results for CPX test.  Also would like to speak with RN about continuing symptoms of shortness of breath Initial call taken by: Shanda Howells,  Apr 11, 2010 9:08 AM  Follow-up for Phone Call        PT [place] SOB-SHE FEELS THAT THIS IS AN EMERGENCY Follow-up by: Zenovia Jarred,  Apr 11, 2010 9:22 AM  Additional Follow-up for Phone Call Additional follow up Details #1::        Pt was scheduled for CPX at [facility] on 03/28/10.  No results in EMR.  Attempted to call back pt.  LMOM TCB. Freddrick March RN  Apr 11, 2010 3:22 PM   Called spoke with pt.  Pt states this episodic SOB has been going on since Febuary and is no worse.  It is relieved by rest and pt does not feel she needs to go to ED.  Advised pt to go to ED if worsens or is not relieved with rest.  Will forward message to Dr Caryl Comes to review stress test results and advise of results.  Pt very anxious about results.   Additional Follow-up by: Freddrick March RN,  Apr 11, 2010 3:43 PM    Additional Follow-up for Phone Call Additional follow up Details #2::    CPST results back.  Pt is seeing Dr Rockey Situ today in office will review results at [place]. Follow-up by: Freddrick March RN,  Apr 13, 2010 9:00 AM

## 2011-01-03 NOTE — Miscellaneous (Signed)
Summary: Device preload  Clinical Lists Changes  Observations: Added new observation of PPM INDICATN: Sick sinus syndrome +Coumadin (01/29/2009 9:07) Added new observation of MAGNET RTE: BOL 85 ERI  65 (01/29/2009 9:07) Added new observation of PPMLEADSTAT2: active (01/29/2009 9:07) Added new observation of PPMLEADSER2: [id number] (01/29/2009 9:07) Added new observation of PPMLEADMOD2: 5076  (01/29/2009 9:07) Added new observation of PPMLEADDOI2: 12/20/2006  (01/29/2009 9:07) Added new observation of PPMLEADLOC2: RV  (01/29/2009 9:07) Added new observation of PPMLEADSTAT1: active  (01/29/2009 9:07) Added new observation of PPMLEADSER1: [id number]  (01/29/2009 9:07) Added new observation of PPMLEADMOD1: 5076  (01/29/2009 9:07) Added new observation of PPMLEADDOI1: 12/20/2006  (01/29/2009 9:07) Added new observation of PPMLEADLOC1: RA  (01/29/2009 9:07) Added new observation of PPM DOI: 12/20/2006  (01/29/2009 9:07) Added new observation of PPM SERL#: [id number]  (01/29/2009 9:07) Added new observation of PPM MODL#: [id number]  (01/29/2009 9:07) Added new observation of PACEMAKERMFG: [employer]  (01/29/2009 9:07) Added new observation of CARDIO MD: Cristopher Peru, MD  (01/29/2009 9:07)      PPM Specifications Following MD:  Cristopher Peru, MD     PPM Vendor:  [employer]     PPM Model Number:  [id number]     PPM Serial Number:  [id number] PPM DOI:  12/20/2006       Lead 1    Location: RA     DOI: 12/20/2006     Model #: [id number]     Serial #: [id number]     Status: active Lead 2    Location: RV     DOI: 12/20/2006     Model #: [id number]     Serial #: [id number]     Status: active  Magnet Response Rate:  BOL 85 ERI  65  Indications:  Sick sinus syndrome +Coumadin  ICD Specifications Following MD: Cristopher Peru, MD

## 2011-01-03 NOTE — Miscellaneous (Signed)
Summary: dx correction   Clinical Lists Changes  Problems: Changed problem from PACEMAKER (ICD-V45.Marland Kitchen01) to PACEMAKER, PERMANENT (ICD-V45.01) changed the incorrect dx code to correct dx code Valeria Batman  September 30, 2010 12:17 PM

## 2011-01-03 NOTE — Progress Notes (Signed)
Summary: MEDICATION   Phone Note Call from Patient Call back at Home Phone [phone]   Caller: SELF Call For: KLEIN Summary of Call: [place] Initial call taken by: Zenovia Jarred,  November 02, 2009 12:12 PM    Prescriptions: METOPROLOL SUCCINATE 100 MG XR24H-TAB (METOPROLOL SUCCINATE) Take one tablet by mouth twice a day  #62 x 6   Entered by:   Philemon Kingdom   Authorized by:   Nikki Dom, MD, [facility]   Signed by:   Philemon Kingdom on 11/03/2009   Method used:   Electronically to        [facility]. [phone] (retail)       [address] [place], [place]  [address]       Ph: [phone] or [phone]       Fax: [phone]   RxID:   [id number]

## 2011-01-03 NOTE — Assessment & Plan Note (Signed)
Summary: rov   Referring Provider:  Caryl Comes Primary Provider:  Maryland Pink, MD  CC:  chronic SOB; Rapid HR; Chronic fatigue.  History of Present Illness: [name] [name] is seen in followup for ahistory of atrial fibrillation, symptomatic palpitations, PVCs, status post pacemaker implantation for sick sinus syndrome/tachybradycardia syndrome with chronic symptoms of palpitations, shortness of breath with exertion which has been under the care of Dr. Rockey Situ   her cardiac workup in the past has included  cardiac catheterization in 2004, stress testing, echocardiography in 2009 showing normal systolic function, diastolic relaxation abnormality, borderline elevated right ventricular systolic pressures.  Her major complaint continues to be palpitations which antedates the implantation of her pacemaker, but mostly shortness of breath with exertion which includes even just talking for prolonged periods. Ulnar function evaluation has demonstrated extrinsic compression/restrictive physiology. She has modest edema.    Problems Prior to Update: 1)  Dyspnea  (ICD-786.05) 2)  Premature Ventricular Contractions  (ICD-427.69) 3)  Atrial Fibrillation  (ICD-427.31) 4)  Sick Sinus/ Tachy-brady Syndrome  (ICD-427.81) 5)  Edema  (ICD-782.3) 6)  Encounter For Long-term Use of Other Medications  (ICD-V58.69) 7)  Hypertension, Unspecified  (ICD-401.9) 8)  Pacemaker  (ICD-V45.Marland Kitchen01) 9)  Gerd  (ICD-530.81) 10)  Glaucoma  (ICD-365.9) 11)  Diabetes Mellitus  (ICD-250.00)  Current Medications (verified): 1)  Metoprolol Succinate 100 Mg Xr24h-Tab (Metoprolol Succinate) .... Take One Tablet By Mouth Twice A Day 2)  Metformin Hcl 500 Mg Xr24h-Tab (Metformin Hcl) .Marland Kitchen.. 1 By Mouth Two Times A Day 3)  Nexium 40 Mg Cpdr (Esomeprazole Magnesium) .Marland Kitchen.. 1 Tab Two Times A Day 4)  Lipitor 20 Mg Tabs (Atorvastatin Calcium) .... Take One Tablet By Mouth Daily. 5)  Travatan Z 0.004 % Soln (Travoprost) .... As Directed 6)  Hematinic  Plus Vit/minerals  Tabs (B Complex-C-Min-Fe-Fa) .... Once Daily 7)  Diltiazem Hcl Er Beads 240 Mg Xr24h-Cap (Diltiazem Hcl Er Beads) .... Take One Capsule By Mouth Daily 8)  Alprazolam 0.5 Mg Tabs (Alprazolam) .... 1/2 -1 As Needed 9)  Coumadin 4 Mg Tabs (Warfarin Sodium) .Marland Kitchen.. 1 By Mouth Once Daily As Directed By Coumadin Clinic 10)  Benicar Hct 40-12.5 Mg Tabs (Olmesartan Medoxomil-Hctz) .Marland Kitchen.. 1 By Mouth Once Daily 11)  Fish Oil 1000 Mg Caps (Omega-3 Fatty Acids) .Marland Kitchen.. 1 By Mouth Once Daily 12)  Januvia 100 Mg Tabs (Sitagliptin Phosphate) .... Take 1 By Mouth Once Daily  Allergies (verified): No Known Drug Allergies  Past History:  Past Medical History: SICK SINUS/ TACHY-BRADY SYNDROME (ICD-427.81) ATRIAL FIBRILLATION (ICD-427.31) HYPERTENSION, UNSPECIFIED (ICD-401.9) PACEMAKER (ICD-V45.Marland Kitchen01) GERD (ICD-530.81) GLAUCOMA (ICD-365.9) DIABETES MELLITUS (ICD-250.00 obstructive sleep apnea obesity Palpitations  Vital Signs:  Patient profile:   76 year old female Height:      65 inches Weight:      245 pounds BMI:     40.92 Pulse rate:   74 / minute BP sitting:   136 / 64  (left arm)  Vitals Entered By: Eliezer Lofts, EMT-P (March 14, 2010 10:57 AM)  Physical Exam  General:  The patient was alert and oriented in no acute distress. HEENT Normal.  Neck veins were flat, carotids were brisk.  Lungs were clear.  Heart sounds were regular without murmurs or gallops.  Abdomen was soft with active bowel sounds. There is no clubbing cyanosis; trace edema Skin Warm and dry    PPM Specifications Following MD:  Virl Axe, MD     PPM Vendor:  [employer]     PPM Model Number:  [id number]  PPM Serial Number:  [id number] PPM DOI:  12/20/2006      Lead 1    Location: RA     DOI: 12/20/2006     Model #: [id number]     Serial #: [id number]     Status: active Lead 2    Location: RV     DOI: 12/20/2006     Model #: [id number]     Serial #: [id number]     Status: active  Magnet Response Rate:  BOL 85 ERI   65  Indications:  Sick sinus syndrome +Coumadin   PPM Follow Up Pacer Dependent:  Yes      Episodes Coumadin:  Yes  Parameters Mode:  DDDR     Lower Rate Limit:  75     Upper Rate Limit:  100 Paced AV Delay:  340     Sensed AV Delay:  310  Impression & Recommendations:  Problem # 1:  DYSPNEA (ICD-786.05) This remains her major issue.pulmonary function testing was not very elucidating, and so I am going to arrange for cardiopulmonary stress testing to see if we can identify the   limitations of her exercise.  She has not seen pulmonary for consultation and this may be valuable also. We will make a decision regarding this following her CPX.  There may also be a role for further titration of her CPAP.  I do not think that her pacemaker is contributing to her dyspnea. She likely has diastolic dysfunction as suggested by her echo. Her heart rates are not so fast that this should be contributing I don't think.  We will also repeat her echo to reassess her right sided pressures. It may well be that she would benefit from a diagnostic right heart catheterization. I will plan to review these results with Dr.Gollan and Dr. Reine Just to make a decision regarding right heart catheter  Her updated medication list for this problem includes:    Metoprolol Succinate 100 Mg Xr24h-tab (Metoprolol succinate) .Marland Kitchen... Take one tablet by mouth twice a day    Diltiazem Hcl Er Beads 240 Mg Xr24h-cap (Diltiazem hcl er beads) .Marland Kitchen... Take one capsule by mouth daily    Benicar Hct 40-12.5 Mg Tabs (Olmesartan medoxomil-hctz) .Marland Kitchen... 1 by mouth once daily  Orders: CPX Test at [facility] (CPX Test)  Problem # 2:  ATRIAL FIBRILLATION (ICD-427.31) It is not clear to me whether the atrial fibrillation or her PVCs are contributing to her symptoms of palpitations Her updated medication list for this problem includes:    Metoprolol Succinate 100 Mg Xr24h-tab (Metoprolol succinate) .Marland Kitchen... Take one tablet by mouth twice  a day    Coumadin 4 Mg Tabs (Warfarin sodium) .Marland Kitchen... 1 by mouth once daily as directed by coumadin clinic  Problem # 3:  PACEMAKER (ICD-V45.Marland Kitchen01) Device parameters and data were reviewed and no changes were made  Other Orders: Echocardiogram (Echo)  Patient Instructions: 1)  Your physician recommends that you schedule a follow-up appointment in: 6 months 2)  Your physician recommends that you continue on your current medications as directed. Please refer to the Current Medication list given to you today. 3)  Your physician has requested that you have an echocardiogram.  Echocardiography is a painless test that uses sound waves to create images of your heart. It provides your doctor with information about the size and shape of your heart and how well your heart's chambers and valves are working.  This procedure takes approximately one hour. There are no restrictions for  this procedure. 4)  Your physician has recommended that you have a cardiopulmonary stress test (CPX).  CPX testing is a non-invasive measurement of heart and lung function. It replaces a traditional treadmill stress test. This type of test provides a tremendous amount of information that relates not only to your present condition but also for future outcomes.  This test combines measurements of your ventilation, respiratory gas exchange in the lungs, electrocardiogram (EKG), blood pressure and physical response before, during, and following an exercise protocol.

## 2011-01-03 NOTE — Assessment & Plan Note (Signed)
Summary: ROV/AMD      Allergies Added: NKDA  Visit Type:  Follow-up Referring Provider:  Caryl Comes Primary Provider:  Maryland Pink, MD  CC:  SOB - chest tightness - dizzy.  History of Present Illness: Very pleasant 76 year old woman known to me from [facility] who is now being followed by Dr. Caryl Comes at [facility] for a history of atrial fibrillation, symptomatic palpitations, PVCs, status post pacemaker implantation for sick sinus syndrome/tachybradycardia syndrome. She has had symptoms of palpitations, shortness of breath with exertion, flushing for several years leading to significant workup including cardiac catheterization in 2004, stress testing, echocardiography in 2009 showing normal systolic function, diastolic relaxation abnormality, borderline elevated right ventricular systolic pressures.  She presents with similar symptoms stating that she's had worsening shortness of breath over the past month. She was recently seen for pacemaker interrogation and modifications were made. The palpitations that she appreciates was not visualized on pacer rhythm strips. Her heart rate has been well-controlled with exertion despite her symptoms of profound tachycardia palpitations. Her tachycardia at nighttime has improved on diltiazem.  Most of her symptoms today are that she has significant shortness of breath with exertion. This seems to wax and wane as sometimes it is very severe and comes on with minimal exertion, later in the day she notes that she is able to walk long distances without any symptoms. There is significant inconsistencies in her presentation/symptoms. She states that she's been frustrated as this has gone on for several years and blames some of her sensation on her pacemaker.  She does have a smoking history starting at age 77 and stopping smoking at age 67. She denies any significant lower extremity edema or sensation that she has extra fluid in her body.  Sometimes when she is short of breath she gets some left arm discomfort and tingling. She denies any significant cough.  Current Problems (verified): 1)  Premature Ventricular Contractions  (ICD-427.69) 2)  Atrial Fibrillation  (ICD-427.31) 3)  Sick Sinus/ Tachy-brady Syndrome  (ICD-427.81) 4)  Edema  (ICD-782.3) 5)  Encounter For Long-term Use of Other Medications  (ICD-V58.69) 6)  Hypertension, Unspecified  (ICD-401.9) 7)  Pacemaker  (ICD-V45.Marland Kitchen01) 8)  Gerd  (ICD-530.81) 9)  Glaucoma  (ICD-365.9) 10)  Diabetes Mellitus  (ICD-250.00)  Current Medications (verified): 1)  Metoprolol Succinate 100 Mg Xr24h-Tab (Metoprolol Succinate) .... Take One Tablet By Mouth Twice A Day 2)  Metformin Hcl 500 Mg Xr24h-Tab (Metformin Hcl) .Marland Kitchen.. 1 By Mouth Two Times A Day 3)  Nexium 40 Mg Cpdr (Esomeprazole Magnesium) .Marland Kitchen.. 1 Tab Two Times A Day 4)  Lipitor 20 Mg Tabs (Atorvastatin Calcium) .... Take One Tablet By Mouth Daily. 5)  Xalatan 0.005 % Soln (Latanoprost) .... At Bedtime 6)  Hematinic Plus Vit/minerals  Tabs (B Complex-C-Min-Fe-Fa) .... Once Daily 7)  Diltiazem Hcl Er Beads 240 Mg Xr24h-Cap (Diltiazem Hcl Er Beads) .... Take One Capsule By Mouth Daily 8)  Alprazolam 0.5 Mg Tabs (Alprazolam) .... 1/2 -1 As Needed 9)  Coumadin 4 Mg Tabs (Warfarin Sodium) .Marland Kitchen.. 1 By Mouth Once Daily As Directed By Coumadin Clinic 10)  Benicar Hct 40-12.5 Mg Tabs (Olmesartan Medoxomil-Hctz) .Marland Kitchen.. 1 By Mouth Once Daily  Allergies (verified): No Known Drug Allergies  Past History:  Past Medical History: Last updated: 04/13/2009 SICK SINUS/ TACHY-BRADY SYNDROME (ICD-427.81) ATRIAL FIBRILLATION (ICD-427.31) HYPERTENSION, UNSPECIFIED (ICD-401.9) PACEMAKER (ICD-V45.Marland Kitchen01) GERD (ICD-530.81) GLAUCOMA (ICD-365.9) DIABETES MELLITUS (ICD-250.00)    Past Surgical History: Last updated: 04/13/2009 hysterectomy - 1970 cyst on right leg -  03/1998 pacemaker - 2008  Family History: Last updated: 04/13/2009 Father:  cardiac arrest  Social History: Last updated: 04/13/2009 Retired  Widowed  Tobacco Use - Former.  Alcohol Use - no Regular Exercise - yes Drug Use - no  Risk Factors: Exercise: yes (04/13/2009)  Risk Factors: Smoking Status: quit (04/13/2009)  Review of Systems       The patient complains of dyspnea on exertion.  The patient denies anorexia, fever, weight loss, weight gain, vision loss, decreased hearing, hoarseness, chest pain, syncope, peripheral edema, prolonged cough, headaches, hemoptysis, abdominal pain, melena, hematochezia, severe indigestion/heartburn, hematuria, incontinence, muscle weakness, suspicious skin lesions, transient blindness, difficulty walking, depression, unusual weight change, abnormal bleeding, and enlarged lymph nodes.         Arm pain and tingling. Palpitations, tachycardia  Vital Signs:  Patient profile:   76 year old female Height:      65 inches Weight:      247 pounds BMI:     41.25 Pulse rate:   70 / minute BP sitting:   140 / 80  (left arm) Cuff size:   large  Vitals Entered By: Mignon Pine, RMA (January 28, 2010 4:56 PM)  Physical Exam  General:  well appearing, comfortable obese African American woman in no apparent distress, alert and oriented x3, she appeared very comfortable throughout the course of the evaluation. She stated having some mild episodes while she was sitting and talking.  Her HEENT exam is benign, oropharynx is clear, neck is supple with no JVP or carotid bruits, heart sounds are regular with S1-S2 no murmurs appreciated, lungs are clear to auscultation with no wheezes Rales, abdominal exam notable for significant obesity otherwise benign, no significant lower extremity edema, neurologic exam is grossly nonfocal, skin is warm and dry.   EKG  Procedure date:  01/28/2010  Findings:      paced rhythm at 70 beats per minute, a paced V. sensed.  PPM Specifications Following MD:  Virl Axe, MD     PPM Vendor:   [employer]     PPM Model Number:  [id number]     PPM Serial Number:  [id number] PPM DOI:  12/20/2006      Lead 1    Location: RA     DOI: 12/20/2006     Model #: [id number]     Serial #: [id number]     Status: active Lead 2    Location: RV     DOI: 12/20/2006     Model #: [id number]     Serial #: [id number]     Status: active  Magnet Response Rate:  BOL 85 ERI  65  Indications:  Sick sinus syndrome +Coumadin   PPM Follow Up Pacer Dependent:  Yes      Episodes Coumadin:  Yes  Parameters Mode:  DDDR     Lower Rate Limit:  75     Upper Rate Limit:  100 Paced AV Delay:  340     Sensed AV Delay:  310  Impression & Recommendations:  Problem # 1:  DYSPNEA (ICD-786.05) Etiology of her shortness of breath is uncertain. Her heart has been closely studied with catheterizations, stress test and careful monitoring of her rhythm with pacemaker interrogation.  She states that she has not felt well since the pacemaker was put in and I wonder whether  her symptoms may in fact be secondary to the medications. She may have excellent rate control for her tachypalpitations though I wonder if she has significant restrictions  on her rate and chronotropic activity with exertion that might be causing her shortness of breath. I suggested that she could hold her Cardizem over the weekend to see if this helps alleviate her shortness of breath with activity. If she has tachypalpitations at nighttime, she could take an extra half dose of her metoprolol.  I also suggested to her that we could have her participate with cardiac rehabilitation in a monitored setting to see how she performs with exertion, where her blood pressure and heart rate will be monitored closely. This would help [place] determine if she is deconditioned, has significant hypertension with exertion or has any underlying arrhythmia.  Her symptoms of shortness of breath and feeling dizzy "like she is going to black out" come on with exertion, sometimes minimal exertion  and in fact seemed to have been in her office with talking. It is clearly noncardiac and I am concerned about her general deconditioned state. She is obese, and I wonder whether this may be contributing to her symptoms.  Am also concerned that she may have some underlying pulmonary dysfunction and talked to her about whether she may benefit from a bronchodilator challenge or pulmonary function test. She is interested in we will see if we can set this up at [facility]. There is no clear signs of bronchospasm on clinical examination but clearly her shortness of breath is her main concern.  Orders: Full Pulmonary Function Test (PFT)  Problem # 2:  ATRIAL FIBRILLATION (ICD-427.31) Atrial fibrillation seemed to be well controlled based on interrogation of her pacemaker. She has excellent rhythm control on Cardizem and metoprolol. Her updated medication list for this problem includes:    Metoprolol Succinate 100 Mg Xr24h-tab (Metoprolol succinate) .Marland Kitchen... Take one tablet by mouth twice a day    Coumadin 4 Mg Tabs (Warfarin sodium) .Marland Kitchen... 1 by mouth once daily as directed by coumadin clinic  Problem # 3:  HYPERTENSION, UNSPECIFIED (ICD-401.9) blood pressure is relatively well controlled on today's visit. No medication changes were made. Her updated medication list for this problem includes:    Metoprolol Succinate 100 Mg Xr24h-tab (Metoprolol succinate) .Marland Kitchen... Take one tablet by mouth twice a day    Diltiazem Hcl Er Beads 240 Mg Xr24h-cap (Diltiazem hcl er beads) .Marland Kitchen... Take one capsule by mouth daily    Benicar Hct 40-12.5 Mg Tabs (Olmesartan medoxomil-hctz) .Marland Kitchen... 1 by mouth once daily  Patient Instructions: 1)  Your physician has recommended that you have a pulmonary function test.  Pulmonary Function Tests are a group of tests that measure how well air moves in and out of your lungs. 2)  Your physician recommends that you schedule a follow-up appointment in:  3)  Hold your  Diltizem over the weekend. We will call on Monday to see how you are feeling.   Appended Document: ROV/AMD    Clinical Lists Changes  Orders: Added new Test order of Full Pulmonary Function Test (PFT) - Signed

## 2011-01-03 NOTE — Assessment & Plan Note (Signed)
Summary: F6M/AMD  Medications Added BENICAR HCT 40-12.5 MG TABS (OLMESARTAN MEDOXOMIL-HCTZ) 1 by mouth once daily PROPAFENONE HCL 225 MG TABS (PROPAFENONE HCL) Take one tablet by mouth every12 hours      Allergies Added: NKDA  Visit Type:  Device check Primary Provider:  Lovie Macadamia, MD  CC:  no cp; some irregular beat; little sob; more edema in LE. Marland Kitchen  History of Present Illness: [name] is seen in followup for atrial arrhythmias in the setting of a   previously implanted pacemaker.  She has seen [facility] and Dr. Lovena Le in the past .  She had been started on flecainide 50 b.i.d. for paroxysmal atrial fibrillation, which she had previously been tolerating pretty well.  she subsequently developed a  headache which she finally attributed to flecainide discontinued it on her on with subsequent resolution. There is also generalized malaise which accompanied the drug which also improved.  However in the wake of that she has had significant more problems with her palpitations. She would like some medications to try to suppress this. We again reviewed the potential risks ofantiarrhythmic drug therapy and she would like to pursue it nonetheless.  She also notes that she has had for the last 48 hours swelling and tenderness of her left calf. It is somewhat erythematous and it hurts to walk. Notably she does take Coumadin for her atrial arrhythmias.  Current Medications (verified): 1)  Metoprolol Succinate 100 Mg Xr24h-Tab (Metoprolol Succinate) .... Take One Tablet By Mouth Twice A Day 2)  Avandia 4 Mg Tabs (Rosiglitazone Maleate) .... Once Daily 3)  Metformin Hcl 500 Mg Xr24h-Tab (Metformin Hcl) .... 2 Once Daily 4)  Nexium 40 Mg Cpdr (Esomeprazole Magnesium) .... Once Daily 5)  Lipitor 20 Mg Tabs (Atorvastatin Calcium) .... Take One Tablet By Mouth Daily. 6)  Xalatan 0.005 % Soln (Latanoprost) .... At Bedtime 7)  Hematinic Plus Vit/minerals  Tabs (B Complex-C-Min-Fe-Fa) .... Once  Daily 8)  Diltiazem Hcl Er Beads 240 Mg Xr24h-Cap (Diltiazem Hcl Er Beads) .... Take One Capsule By Mouth Daily 9)  Alprazolam 0.5 Mg Tabs (Alprazolam) .... 1/2 -1 As Needed 10)  Coumadin 4 Mg Tabs (Warfarin Sodium) .Marland Kitchen.. 1 By Mouth Once Daily As Directed By Coumadin Clinic 11)  Benicar Hct 40-12.5 Mg Tabs (Olmesartan Medoxomil-Hctz) .Marland Kitchen.. 1 By Mouth Once Daily  Allergies (verified): No Known Drug Allergies  Vital Signs:  Patient profile:   76 year old female Height:      65 inches Weight:      251.25 pounds BMI:     41.96 Pulse rate:   68 / minute Pulse rhythm:   regular BP sitting:   140 / 62  (left arm) Cuff size:   large  Vitals Entered By: Philemon Kingdom (August 23, 2009 12:01 PM)  Physical Exam  General:  The patient was alert and oriented in no acute distress. Neck veins were flat, carotids were brisk.  Lungs were clear.  Heart sounds were regular without murmurs or gallops.  Abdomen was soft with active bowel sounds. There is no clubbing cyanosis or edema. Skin Warm and dry.  there is some erythema at the base of her left calf. It is mildly tender    PPM Specifications Following MD:  Virl Axe, MD     PPM Vendor:  Medtronic     PPM Model Number:  [id number]     PPM Serial Number:  [id number] PPM DOI:  12/20/2006      Lead 1  Location: [facility]     DOI: 12/20/2006     Model #: [id number]     Serial #: [id number]     Status: active Lead 2    Location: RV     DOI: 12/20/2006     Model #: [id number]     Serial #: [id number]     Status: active  Magnet Response Rate:  BOL 85 ERI  65  Indications:  Sick sinus syndrome +Coumadin   PPM Follow Up Remote Check?  No Battery Voltage:  2.97 V     Pacer Dependent:  Yes       PPM Device Measurements Atrium  Amplitude: 1.6 mV, Impedance: 440 ohms, Threshold: 0.5 V at 0.4 msec Right Ventricle  Amplitude: 6.6 mV, Impedance: 512 ohms, Threshold: 1.0 V at 0.4 msec  Episodes MS Episodes:  1     Percent Mode Switch:  <0.1%     Coumadin:   Yes Ventricular High Rate:  0     Atrial Pacing:  94.3%     Ventricular Pacing:  61.6%  Parameters Mode:  DDDR     Lower Rate Limit:  70     Upper Rate Limit:  100 Paced AV Delay:  340     Sensed AV Delay:  310 Tech Comments:  No parameter changes.  Dr. Caryl Comes will see her back in 6 months.  Impression & Recommendations:  Problem # 1:  CELLULITIS, LEG, LEFT (ICD-682.6) I am concerned about the underlying swelling incontinence of the cellulitis. He is to see her primary care physician this evening so not prescribe any medications. We will plan to check a venous Doppler to make sure there is no blood clot there although the likelihood is quite low and she is on therapeutic Coumadin.  Problem # 2:  SICK SINUS/ TACHY-BRADY SYNDROME (ICD-427.81) HEENT he has had symptoms related to both PACs and PVCs which are detected on her pacemaker interrogation. Will begin her on Copaxone on a 225 mg twice daily. The event that this does not work we can consider dronaderone or amiodarone.  I have reviewed with her the potential side effects and risks.  Problem # 3:  PACEMAKER (ICD-V45.Marland Kitchen01) abdomen normal device function  Problem # 4:  ENCOUNTER FOR LONG-TERM USE OF OTHER MEDICATIONS (ICD-V58.69) we will plan to change her antiarrhythmic therapy from flecainide 2 Rythmol for the reasons listed above  Other Orders: Venous Duplex Lower Extremity (Venous Duplex Lower)  Patient Instructions: 1)  Your physician has recommended you make the following change in your medication: start propafenone 225 mg twice a day. 2)  Your physician has requested that you have a lower or upper extremity venous duplex.  This test is an ultrasound of the veins in the legs or arms.  It looks at venous blood flow that carries blood from the heart to the legs or arms.  Allow one hour for a Lower Venous exam.  Allow thirty minutes for an Upper Venous exam. There are no restrictions or special instructions. Prescriptions: PROPAFENONE  HCL 225 MG TABS (PROPAFENONE HCL) Take one tablet by mouth every12 hours  #60 x 6   Entered by:   Gabriel Cirri, RN, BSN   Authorized by:   Nikki Dom, MD, [facility]   Signed by:   Gabriel Cirri, RN, BSN on 08/23/2009   Method used:   Electronically to        [facility]. [phone]* (retail)       [place]  [place], [place]  [address]       Ph: [phone] or [phone]       Fax: [phone]   RxID:   [id number]

## 2011-01-03 NOTE — Progress Notes (Signed)
Summary: RX   Phone Note Refill Request Call back at Home Phone [phone] Message from:  SELF on July 05, 2009 10:29 AM  Refills Requested: Medication #1:  LIPITOR 20 MG TABS Take one tablet by mouth daily. [facility] [address]  Initial call taken by: Zenovia Jarred,  July 05, 2009 10:29 AM  Follow-up for Phone Call        taken care of Follow-up by: Philemon Kingdom,  July 05, 2009 12:07 PM

## 2011-01-03 NOTE — Miscellaneous (Signed)
Summary: pft's  Clinical Lists Changes  Observations: Added new observation of PFT RSLT: Spirometry is within normal linits although early restriction cannot be ruled out.  DLCO was moderate to severely decreased but when corrected for alveolar ventilation it was supernormal. There findings are certainly found in patients with extrathoracic restictive causes such as obesity, cardiomegaly, and pleural effusion, the former of which is seen in this case.   (02/03/2010 8:45)      PFT  Procedure date:  02/03/2010  Findings:      Spirometry is within normal linits although early restriction cannot be ruled out.  DLCO was moderate to severely decreased but when corrected for alveolar ventilation it was supernormal. There findings are certainly found in patients with extrathoracic restictive causes such as obesity, cardiomegaly, and pleural effusion, the former of which is seen in this case.

## 2011-01-03 NOTE — Miscellaneous (Signed)
  Clinical Lists Changes  Medications: Changed medication from METOPROLOL SUCCINATE 100 MG XR24H-TAB (METOPROLOL SUCCINATE) Take 1/2 tablet by mouth twice a day to METOPROLOL SUCCINATE 50 MG XR24H-TAB (METOPROLOL SUCCINATE) Take one tablet by mouth daily - Signed Rx of METOPROLOL SUCCINATE 50 MG XR24H-TAB (METOPROLOL SUCCINATE) Take one tablet by mouth daily;  #60 x 6;  Signed;  Entered by: Cordelia Pen, RN;  Authorized by: Esmond Plants MD;  Method used: Electronically to [facility]. [phone]*, [address] [place], [place], [place]  [address], Ph: [phone] or [phone], Fax: [phone]    Prescriptions: METOPROLOL SUCCINATE 50 MG XR24H-TAB (METOPROLOL SUCCINATE) Take one tablet by mouth daily  #60 x 6   Entered by:   Cordelia Pen, RN   Authorized by:   Esmond Plants MD   Signed by:   Cordelia Pen, RN on 05/26/2010   Method used:   Electronically to        [facility]. [phone]* (retail)       [address] [place], [place]  [address]       Ph: [phone] or [phone]       Fax: [phone]   RxID:   [id number]

## 2011-01-03 NOTE — Assessment & Plan Note (Signed)
Summary: rov  Medications Added METFORMIN HCL 500 MG XR24H-TAB (METFORMIN HCL) 1 by mouth once daily      Allergies Added: NKDA  Primary Provider:  Lovie Macadamia, MD  CC:  still having the irregular beat, thinks heart has been on the slow side, feels like going to pass out, fatique, and no edema except left leg; stopped the med last week due to having the same respose as the flecanide. Marland Kitchen  History of Present Illness: [name] [name] is seen in followup for atrial arrhythmias in the setting of a   previously implanted pacemaker.    .  She had been started on flecainide 50 b.i.d. for paroxysmal atrial fibrillation, which she had previously been tolerating pretty well.  she subsequently developed a  headache which she finally attributed to flecainide discontinued it on her on with subsequent resolution At our last visit we tried to put her on Rythmol; unfortunately she failed to tolerate that as well as it too was associated with a headache.  She continues to complain of frequent almost daily palpitations. She describes them as going on in her neck and going on for hours. Interrogation of her device demonstrates that these are not mostly atrial fibrillation; rather they are premature ventricular contractions    Current Medications (verified): 1)  Metoprolol Succinate 100 Mg Xr24h-Tab (Metoprolol Succinate) .... Take One Tablet By Mouth Twice A Day 2)  Metformin Hcl 500 Mg Xr24h-Tab (Metformin Hcl) .Marland Kitchen.. 1 By Mouth Once Daily 3)  Nexium 40 Mg Cpdr (Esomeprazole Magnesium) .... Once Daily 4)  Lipitor 20 Mg Tabs (Atorvastatin Calcium) .... Take One Tablet By Mouth Daily. 5)  Xalatan 0.005 % Soln (Latanoprost) .... At Bedtime 6)  Hematinic Plus Vit/minerals  Tabs (B Complex-C-Min-Fe-Fa) .... Once Daily 7)  Diltiazem Hcl Er Beads 240 Mg Xr24h-Cap (Diltiazem Hcl Er Beads) .... Take One Capsule By Mouth Daily 8)  Alprazolam 0.5 Mg Tabs (Alprazolam) .... 1/2 -1 As Needed 9)  Coumadin 4 Mg Tabs  (Warfarin Sodium) .Marland Kitchen.. 1 By Mouth Once Daily As Directed By Coumadin Clinic 10)  Benicar Hct 40-12.5 Mg Tabs (Olmesartan Medoxomil-Hctz) .Marland Kitchen.. 1 By Mouth Once Daily  Allergies (verified): No Known Drug Allergies  Vital Signs:  Patient profile:   76 year old female Height:      65 inches Weight:      241.50 pounds BMI:     40.33 Pulse rate:   70 / minute Pulse rhythm:   regular BP sitting:   154 / 86  (left arm) Cuff size:   large  Vitals Entered By: Philemon Kingdom (September 13, 2009 10:56 AM)  Physical Exam  General:  The patient was alert and oriented in no acute distress. Neck veins were flat, carotids were brisk.  Lungs were clear.  Heart sounds were regular without murmurs or gallops.  Abdomen was soft with active bowel sounds. There is no clubbing cyanosis or edema. Skin Warm and dry    PPM Specifications Following MD:  Virl Axe, MD     PPM Vendor:  [employer]onic     PPM Model Number:  [id number]     PPM Serial Number:  [id number] PPM DOI:  12/20/2006      Lead 1    Location: RA     DOI: 12/20/2006     Model #: [id number]     Serial #: [id number]     Status: active Lead 2    Location: RV     DOI: 12/20/2006  Model #: [id number]     Serial #: [id number]     Status: active  Magnet Response Rate:  BOL 85 ERI  65  Indications:  Sick sinus syndrome +Coumadin   PPM Follow Up Remote Check?  No Battery Voltage:  2.97 V     Pacer Dependent:  Yes       PPM Device Measurements Atrium  Amplitude: 1.5 mV, Impedance: 432 ohms,  Right Ventricle  Amplitude: 6.6 mV, Impedance: 520 ohms,   Episodes MS Episodes:  1     Coumadin:  Yes Atrial Pacing:  92.8%     Ventricular Pacing:  47.5%  Parameters Mode:  DDDR     Lower Rate Limit:  75     Upper Rate Limit:  100 Paced AV Delay:  340     Sensed AV Delay:  310 Tech Comments:  one treated mode switch.  10 PVC's/hour. Base rate changed to [place], LPN  October 11, 624THL 11:18 AM   Impression & Recommendations:  Problem # 1:   ATRIAL FIBRILLATION (ICD-427.31) her atrial fibrillation has been relatively well controlled. She has had one episode. The following medications were removed from the medication list:    Propafenone Hcl 225 Mg Tabs (Propafenone hcl) .Marland Kitchen... Take one tablet by mouth every12 hours Her updated medication list for this problem includes:    Metoprolol Succinate 100 Mg Xr24h-tab (Metoprolol succinate) .Marland Kitchen... Take one tablet by mouth twice a day    Coumadin 4 Mg Tabs (Warfarin sodium) .Marland Kitchen... 1 by mouth once daily as directed by coumadin clinic  Orders: EKG w/ Interpretation (93000)  Problem # 2:  PREMATURE VENTRICULAR CONTRACTIONS (ICD-427.69) This appears to be the more likely culprit for her palpitations. I went over this with her and described the pathophysiology. She does not want to try another antiarrhythmic drug at this tg her heart rate on her pacemaker to 75. Unfortunately her current device does not include a sleep mode. The following medications were removed from the medication list:    Propafenone Hcl 225 Mg Tabs (Propafenone hcl) .Marland Kitchen... Take one tablet by mouth every12 hours Her updated medication list for this problem includes:    Metoprolol Succinate 100 Mg Xr24h-tab (Metoprolol succinate) .Marland Kitchen... Take one tablet by mouth twice a day    Diltiazem Hcl Er Beads 240 Mg Xr24h-cap (Diltiazem hcl er beads) .Marland Kitchen... Take one capsule by mouth daily    Coumadin 4 Mg Tabs (Warfarin sodium) .Marland Kitchen... 1 by mouth once daily as directed by coumadin clinic  Problem # 3:  PACEMAKER (ICD-V45.Marland Kitchen01) device interrogated and reprogrammed as above  Patient Instructions: 1)  Your physician recommends that you schedule a follow-up appointment in: 6 months

## 2011-01-03 NOTE — Progress Notes (Signed)
Summary: RX   Phone Note Refill Request Call back at Home Phone [phone] Message from:  Patient on December 31, 2009 10:26 AM  Refills Requested: Medication #1:  DILTIAZEM HCL ER BEADS 240 MG XR24H-CAP Take one capsule by mouth daily DOES NOT HAVE ANY FOR THE WEEKEND[facility] [address]  Initial call taken by: Zenovia Jarred,  December 31, 2009 10:26 AM    Prescriptions: DILTIAZEM HCL ER BEADS 240 MG XR24H-CAP (DILTIAZEM HCL ER BEADS) Take one capsule by mouth daily  #30 x 6   Entered by:   Philemon Kingdom   Authorized by:   Nikki Dom, MD, [facility]   Signed by:   Philemon Kingdom on 12/31/2009   Method used:   Electronically to        [facility]. [phone]* (retail)       [address] [place], [place]  [address]       Ph: [phone] or [phone]       Fax: [phone]   RxID:   [id number]

## 2011-01-03 NOTE — Progress Notes (Signed)
Summary: Coumadin Notification   Phone Note Call from Patient Call back at Home Phone [phone]   Caller: Patient Call For: [facility] Summary of Call: Patient called this morning and wanted to let RN know that she will be having her coumadin checked by her primary care physician Dr. Kary Kos since we are no longer doing the lab draws. Initial call taken by: Shanda Howells,  June 15, 2010 8:33 AM  Follow-up for Phone Call        Called spoke with pt.  Pt states Dr Kary Kos is following and just had INR done and it was 3.8. Nurse called pt and adjusted dosage, next check 06/29/10. Will make pt inactive for coumadin checks.  Follow-up by: Freddrick March RN,  June 15, 2010 9:01 AM     Appended Document: Coumadin Clinic    Anticoagulant Therapy  Managed by: Inactive PCP: Maryland Pink, MD Supervising MD: Rockey Situ Indication 1: Atrial Fibrillation (ICD-427.31) Lab Used: [facility] Site: [place] INR RANGE 2.0-3.0          Comments: Pt now having coumadin monitored by Dr Maryland Pink, pt's primary MD. Freddrick March RN  June 15, 2010 9:02 AM   Allergies: No Known Drug Allergies  Anticoagulation Management History:      Positive risk factors for bleeding include an age of 31 years or older and presence of serious comorbidities.  The bleeding index is 'intermediate risk'.  Positive CHADS2 values include History of HTN and History of Diabetes.  Negative CHADS2 values include Age > 61 years old.  The start date was 09/21/2008.  Her last INR was 2.89.  Anticoagulation responsible provider: [place].  Exp: 07/2011.    Anticoagulation Management Assessment/Plan:      The patient's current anticoagulation dose is Coumadin 4 mg tabs: 1 by mouth once daily as directed by coumadin clinic.  The target INR is 2 - 3.  The next INR is due 06/17/2010.  Anticoagulation instructions were given to patient.  Results were reviewed/authorized by Inactive.         Prior Anticoagulation  Instructions: INR 2.89  Called pt No change in dose. Continue taking 1 tablet daily. Recheck in 3 weeks.

## 2011-01-03 NOTE — Medication Information (Signed)
Summary: ccr   Anticoagulant Therapy  Managed by: Gabriel Cirri, RN, BSN PCP: Lovie Macadamia, MD Supervising MD: Aundra Dubin MD, [place] Indication 1: Atrial Fibrillation (ICD-427.31) Lab Used: [facility] Site: [place]  Dietary changes: no    Health status changes: no    Bleeding/hemorrhagic complications: no    Recent/future hospitalizations: no    Any changes in medication regimen? no    Recent/future dental: no  Any missed doses?: no       Is patient compliant with meds? yes       Allergies: No Known Drug Allergies  Anticoagulation Management History:      Her anticoagulation is being managed by telephone today.  Positive risk factors for bleeding include an age of 22 years or older and presence of serious comorbidities.  The bleeding index is 'intermediate risk'.  Positive CHADS2 values include History of HTN and History of Diabetes.  Negative CHADS2 values include Age > 9 years old.  The start date was 09/21/2008.  Her last INR was 3.3 and today's INR is 2.68.  Anticoagulation responsible provider: Aundra Dubin MD, [place].    Anticoagulation Management Assessment/Plan:      The patient's current anticoagulation dose is Coumadin 4 mg tabs: 1 by mouth once daily as directed by coumadin clinic.  The target INR is 2 - 3.  The next INR is due 10/26/2009.  Anticoagulation instructions were given to patient.  Results were reviewed/authorized by Gabriel Cirri, RN, BSN.  She was notified by Gabriel Cirri, RN, BSN.         Prior Anticoagulation Instructions: none today then resume coumadin 4mg  daily.  Current Anticoagulation Instructions: The patient is to continue with the same dose of coumadin.  This dosage includes: coumadin 4 mg daily

## 2011-01-03 NOTE — Progress Notes (Signed)
Summary: MEDICATION   Phone Note Call from Patient Call back at Home Phone [phone]   Caller: SELF Call For: KLEIN Summary of Call: PAT WOULD [place]. Initial call taken by: Zenovia Jarred,  May 27, 2009 8:15 AM  Follow-up for Phone Call        Pt called this am and said that she has started to have the skips again. She said that she would start on the 100mg  of Flecanide two times a day, she is coming in to see Caryl Comes on Monday. I told her that was fine since that is what they had originally increased her to. Joelene Millin :) Follow-up by: Philemon Kingdom,  May 27, 2009 8:29 AM

## 2011-01-03 NOTE — Progress Notes (Signed)
Summary: SAMPLES  Medications Added WARFARIN SODIUM 4 MG TABS (WARFARIN SODIUM) Use as directed by Anticoagulation Clinic       Phone Note Call from Patient Call back at [phone]   Caller: SELF Call For: KLEIN Summary of Call: WOULD LIKE SAMPLES OF COUMADIN.  PLEASE CALL TO LET HER KNOW.  SHE HAS TAKEN HER LAST PILL.  JUST NEEDS ENOUGH TO LAST UNTIL NEXT FRIDAY. Initial call taken by: Zenovia Jarred,  Apr 28, 2009 10:26 AM  Follow-up for Phone Call        Spoke with neese to let her know that we don't have any samples of Coumadin, and I would call in a new RX to her drug store, and they shouldn't penalize her for the increase. The neese said that she would try to get in touch with her and also gave me her phone number at home. Tried to call pt at home to give the information, not available will try again. Joelene Millin :) Follow-up by: Philemon Kingdom,  Apr 28, 2009 10:41 AM  Additional Follow-up for Phone Call Additional follow up Details #1::        3:28pm- [facility]   Additional Follow-up by: Alvis Lemmings, RN, BSN,  Apr 28, 2009 3:30 PM    Additional Follow-up for Phone Call Additional follow up Details #2::    SPOKE WITH NIECE, AND TOLD HER THE [facility]. NIECE SAID SHE HAD SPOKEN WITH HER AND SHE WAS AWARE. [facility] :) Follow-up by: Philemon Kingdom,  Apr 28, 2009 5:08 PM  New/Updated Medications: WARFARIN SODIUM 4 MG TABS (WARFARIN SODIUM) Use as directed by Anticoagulation Clinic   Prescriptions: WARFARIN SODIUM 4 MG TABS (WARFARIN SODIUM) Use as directed by Anticoagulation Clinic  #35 x 3   Entered by:   Philemon Kingdom   Authorized by:   Nikki Dom, MD, [facility]   Signed by:   Philemon Kingdom on 04/28/2009   Method used:   Electronically to        [facility]. [phone]* (retail)       [address] [place], [place]  [address]       Ph: [phone] or [phone]       Fax: [phone]   RxID:    [id number]

## 2011-01-03 NOTE — Assessment & Plan Note (Signed)
Summary: ROV/PTINR/AMD  Medications Added TRAVATAN Z 0.004 % SOLN (TRAVOPROST) as directed FISH OIL 1000 MG CAPS (OMEGA-3 FATTY ACIDS) 1 by mouth once daily      Allergies Added: NKDA  Visit Type:  rov Referring Provider:  Caryl Comes Primary Provider:  Maryland Pink, MD  CC:  still having the shortness of breath and can't walk very far without giving out. Little tightness in chest. Still has cough.  .  History of Present Illness: 76 year old woman followed by Dr. Caryl Comes at [facility] for a history of atrial fibrillation, symptomatic palpitations, PVCs, status post pacemaker implantation for sick sinus syndrome/tachybradycardia syndrome with chronic symptoms of palpitations, shortness of breath with exertion, flushing for several years leading to significant workup including cardiac catheterization in 2004, stress testing, echocardiography in 2009 showing normal systolic function, diastolic relaxation abnormality, borderline elevated right ventricular systolic pressures.  She does have a smoking history starting at age 46 and stopping smoking at age 35.  Ms. Conkel states that she had her pulmonary function test since her last clinic visit several weeks ago. She has moved her diltiazem to evening dosing and she believes that this has helped her palpitations overnight. She continues to have significant shortness of breath with exertion. We have talked about her weight with her extensively and she believes that this is up extensively over the past year or 2. To this time, she has been much less active than she use to. She used to walk on a race track on a frequent basis but now does not do any  Pulmonary function test shows DLCO was moderate to severely decreased but when corrected for alveolar ventilation was supranormal. Note indicates that this finding can be seen in patients with extrathoracic restrictive causes such as obesity, cardiomyopathy and pleural effusion. Full report is scanned in the  computer.  Patient currently uses CPAP for her sleep apnea with a setting of 13. We ambulated with her in the hallways in the clinic and her oxygen saturation did not decrease. Her heart rate did increase to 100 beats per minute and she was short of breath and felt that she had a "episode".  Current Problems (verified): 1)  Dyspnea  (ICD-786.05) 2)  Premature Ventricular Contractions  (ICD-427.69) 3)  Atrial Fibrillation  (ICD-427.31) 4)  Sick Sinus/ Tachy-brady Syndrome  (ICD-427.81) 5)  Edema  (ICD-782.3) 6)  Encounter For Long-term Use of Other Medications  (ICD-V58.69) 7)  Hypertension, Unspecified  (ICD-401.9) 8)  Pacemaker  (ICD-V45.Marland Kitchen01) 9)  Gerd  (ICD-530.81) 10)  Glaucoma  (ICD-365.9) 11)  Diabetes Mellitus  (ICD-250.00)  Current Medications (verified): 1)  Metoprolol Succinate 100 Mg Xr24h-Tab (Metoprolol Succinate) .... Take One Tablet By Mouth Twice A Day 2)  Metformin Hcl 500 Mg Xr24h-Tab (Metformin Hcl) .Marland Kitchen.. 1 By Mouth Two Times A Day 3)  Nexium 40 Mg Cpdr (Esomeprazole Magnesium) .Marland Kitchen.. 1 Tab Two Times A Day 4)  Lipitor 20 Mg Tabs (Atorvastatin Calcium) .... Take One Tablet By Mouth Daily. 5)  Travatan Z 0.004 % Soln (Travoprost) .... As Directed 6)  Hematinic Plus Vit/minerals  Tabs (B Complex-C-Min-Fe-Fa) .... Once Daily 7)  Diltiazem Hcl Er Beads 240 Mg Xr24h-Cap (Diltiazem Hcl Er Beads) .... Take One Capsule By Mouth Daily 8)  Alprazolam 0.5 Mg Tabs (Alprazolam) .... 1/2 -1 As Needed 9)  Coumadin 4 Mg Tabs (Warfarin Sodium) .Marland Kitchen.. 1 By Mouth Once Daily As Directed By Coumadin Clinic 10)  Benicar Hct 40-12.5 Mg Tabs (Olmesartan Medoxomil-Hctz) .Marland Kitchen.. 1 By Mouth Once Daily 11)  Fish Oil 1000 Mg Caps (Omega-3 Fatty Acids) .Marland Kitchen.. 1 By Mouth Once Daily  Allergies (verified): No Known Drug Allergies  Past History:  Past Medical History: Last updated: 04/13/2009 SICK SINUS/ TACHY-BRADY SYNDROME (ICD-427.81) ATRIAL FIBRILLATION (ICD-427.31) HYPERTENSION, UNSPECIFIED  (ICD-401.9) PACEMAKER (ICD-V45.Marland Kitchen01) GERD (ICD-530.81) GLAUCOMA (ICD-365.9) DIABETES MELLITUS (ICD-250.00)    Past Surgical History: Last updated: 04/13/2009 hysterectomy - 1970 cyst on right leg - 03/1998 pacemaker - 2008  Family History: Last updated: 04/13/2009 Father: cardiac arrest  Social History: Last updated: 04/13/2009 Retired  Widowed  Tobacco Use - Former.  Alcohol Use - no Regular Exercise - yes Drug Use - no  Risk Factors: Exercise: yes (04/13/2009)  Risk Factors: Smoking Status: quit (04/13/2009)  Review of Systems       The patient complains of weight gain and dyspnea on exertion.  The patient denies fever, vision loss, decreased hearing, hoarseness, chest pain, peripheral edema, prolonged cough, abdominal pain, incontinence, muscle weakness, difficulty walking, depression, unusual weight change, and enlarged lymph nodes.         Tachycardia/palpitations, flushing  Vital Signs:  Patient profile:   76 year old female Height:      65 inches Weight:      247.50 pounds BMI:     41.34 O2 Sat:      98 % on Room air Pulse rate:   71 / minute Pulse rhythm:   regular BP sitting:   165 / 82  (left arm) Cuff size:   large  Vitals Entered By: Philemon Kingdom (February 09, 2010 10:36 AM)  O2 Flow:  Room air  Physical Exam  General:  obese  woman in no apparent distress, no significant respiratory distress and appeared comfortable, HEENT exam is benign, neck is supple with no JVP or carotid bruits, heart sounds are regular with normal S1 and S2 with no murmurs appreciated, lungs are clear to auscultation with no wheezes or radials, abdominal exam is benign apart from severe obesity, no significant lower extremity edema, neurologic exam grossly nonfocal, pulses equal and symmetrical in upper and lower extremities, skin is warm and dry.   PPM Specifications Following MD:  Virl Axe, MD     PPM Vendor:  [employer]onic     PPM Model Number:  [id number]     PPM Serial  Number:  [id number] PPM DOI:  12/20/2006      Lead 1    Location: [facility]     DOI: 12/20/2006     Model #: [id number]     Serial #: [id number]     Status: active Lead 2    Location: RV     DOI: 12/20/2006     Model #: [id number]     Serial #: [id number]     Status: active  Magnet Response Rate:  BOL 85 ERI  65  Indications:  Sick sinus syndrome +Coumadin   PPM Follow Up Pacer Dependent:  Yes      Episodes Coumadin:  Yes  Parameters Mode:  DDDR     Lower Rate Limit:  75     Upper Rate Limit:  100 Paced AV Delay:  340     Sensed AV Delay:  310  Impression & Recommendations:  Problem # 1:  DYSPNEA (ICD-786.05) I believe the shortness of breath is due to underlying extra thoracic restrictive causes for her obesity. She is very deconditioned and has not exercised in quite some time with a large increase in her weight. The pulmonary function tests seemed to confirm that  her obesity might be contributing to her shortness of breath.  We have recommended that she start walking and doing some exercise at the [facility] [address]. She states that her insurance will cover membership to the gym. With exertion in the clinic, her oxygen level was maintained and her heart rate increased appropriately.  Her updated medication list for this problem includes:    Metoprolol Succinate 100 Mg Xr24h-tab (Metoprolol succinate) .Marland Kitchen... Take one tablet by mouth twice a day    Diltiazem Hcl Er Beads 240 Mg Xr24h-cap (Diltiazem hcl er beads) .Marland Kitchen... Take one capsule by mouth daily    Benicar Hct 40-12.5 Mg Tabs (Olmesartan medoxomil-hctz) .Marland Kitchen... 1 by mouth once daily  Orders: Home Health Referral ([place])  Problem # 2:  ATRIAL FIBRILLATION (ICD-427.31) I believe her heart rate is well controlled on her current medication regimen and the she will follow up with Dr.Klein for a pacemaker check per routine. We will continue her on the metoprolol and diltiazem at its current dose. Her updated medication list for this problem includes:     Metoprolol Succinate 100 Mg Xr24h-tab (Metoprolol succinate) .Marland Kitchen... Take one tablet by mouth twice a day    Coumadin 4 Mg Tabs (Warfarin sodium) .Marland Kitchen... 1 by mouth once daily as directed by coumadin clinic  Problem # 3:  SICK SINUS/ TACHY-BRADY SYNDROME (ICD-427.81) pacemaker placed and followed by Dr.Klein.  Her updated medication list for this problem includes:    Metoprolol Succinate 100 Mg Xr24h-tab (Metoprolol succinate) .Marland Kitchen... Take one tablet by mouth twice a day    Diltiazem Hcl Er Beads 240 Mg Xr24h-cap (Diltiazem hcl er beads) .Marland Kitchen... Take one capsule by mouth daily    Coumadin 4 Mg Tabs (Warfarin sodium) .Marland Kitchen... 1 by mouth once daily as directed by coumadin clinic  Orders: Home Health Referral ([place])  Problem # 4:  HYPERTENSION, UNSPECIFIED (ICD-401.9) Blood pressure is borderline and I have asked her to monitor this with [place]. If her systolic pressures continued to be mildly elevated, we could increase her Benicar HCT to 80/25 mg daily.  Her updated medication list for this problem includes:    Metoprolol Succinate 100 Mg Xr24h-tab (Metoprolol succinate) .Marland Kitchen... Take one tablet by mouth twice a day    Diltiazem Hcl Er Beads 240 Mg Xr24h-cap (Diltiazem hcl er beads) .Marland Kitchen... Take one capsule by mouth daily    Benicar Hct 40-12.5 Mg Tabs (Olmesartan medoxomil-hctz) .Marland Kitchen... 1 by mouth once daily  Patient Instructions: 1)  Your physician recommends that you schedule a follow-up appointment in:  2)  Your physician recommends that you continue on your current medications as directed. Please refer to the Current Medication list given to you today. 3)  You have been referred to [facility] for PT evaluation.

## 2011-01-03 NOTE — Assessment & Plan Note (Signed)
Summary: [facility]  Medications Added FLECAINIDE ACETATE 50 MG TABS (FLECAINIDE ACETATE) Take one tablet by mouth every 12 hours      Allergies Added: NKDA  Visit Type:  Follow-up  CC:  edema.  Current Medications (verified): 1)  Metoprolol Succinate 100 Mg Xr24h-Tab (Metoprolol Succinate) .... Take One Tablet By Mouth Twice A Day 2)  Atacand Hct 32-12.5 Mg Tabs (Candesartan Cilexetil-Hctz) .Marland Kitchen.. 1 Tab By Mouth Once Daily 3)  Avandia 4 Mg Tabs (Rosiglitazone Maleate) .... Once Daily 4)  Metformin Hcl 500 Mg Xr24h-Tab (Metformin Hcl) .... 2 Once Daily 5)  Nexium 40 Mg Cpdr (Esomeprazole Magnesium) .... Once Daily 6)  Lipitor 20 Mg Tabs (Atorvastatin Calcium) .... Take One Tablet By Mouth Daily. 7)  Xalatan 0.005 % Soln (Latanoprost) .... At Bedtime 8)  Hematinic Plus Vit/minerals  Tabs (B Complex-C-Min-Fe-Fa) .... Once Daily 9)  Diltiazem Hcl Er Beads 240 Mg Xr24h-Cap (Diltiazem Hcl Er Beads) .... Take One Capsule By Mouth Daily 10)  Alprazolam 0.5 Mg Tabs (Alprazolam) .... 1/2 -1 As Needed 11)  Warfarin Sodium 4 Mg Tabs (Warfarin Sodium) .... Use As Directed By Anticoagulation Clinic 12)  Flecainide Acetate 50 Mg Tabs (Flecainide Acetate) .... Take One Tablet By Mouth Every 12 Hours  Allergies (verified): No Known Drug Allergies  Vital Signs:  Patient profile:   76 year old female Height:      65 inches Weight:      252.25 pounds BMI:     42.13 Pulse rate:   72 / minute Pulse rhythm:   regular BP sitting:   132 / 72  (left arm) Cuff size:   large  Vitals Entered By: Philemon Kingdom (May 31, 2009 11:00 AM) CC: edema Is Patient Diabetic? Yes  Comments No chest pains, SOB, or fatique. Tried to increase Flecanide to 100mg  on Thursday and Friday and started having ringing in the ears so she decreased it back down to 50mg  two times a day and the ringing is somewhat better.     PPM Specifications Following MD:  Virl Axe, MD     PPM Vendor:  [employer]     PPM  Model Number:  [id number]     PPM Serial Number:  [id number] PPM DOI:  12/20/2006       PPM Follow Up Remote Check?  No Battery Voltage:  2.99 V       PPM Device Measurements Atrium  Amplitude: 1.2 mV, Impedance: 408 ohms, Threshold: 1.0 V at 0.4 msec Right Ventricle  Amplitude: 6.6 mV, Impedance: 552 ohms, Threshold: 1.0 V at 0.4 msec  Episodes MS Episodes:  0     Percent Mode Switch:  <0.1%     Coumadin:  Yes Ventricular High Rate:  0     Atrial Pacing:  99.1%     Ventricular Pacing:  66.5%  Parameters Mode:  DDDR     Lower Rate Limit:  70     Upper Rate Limit:  100 Paced AV Delay:  340     Sensed AV Delay:  310 Next Cardiology Appt Due:  11/03/2009 Tech Comments:  Total A-fib time 10 seconds  Patient Instructions: 1)  Continue to take Flecainide 50mg  twice daily. 2)  Call us In 2 weeks and let us know  if you still have ringing in your ears or recurrent palpitations. If so we will need to check bloodwork (bmet/magnesium level) and put a heart monitor on you (event recorder).

## 2011-01-03 NOTE — Medication Information (Signed)
Summary: Coumadin Clinic   Anticoagulant Therapy  Managed by: Gabriel Cirri, RN, BSN PCP: Lovie Macadamia, MD Supervising MD: Rockey Situ Indication 1: Atrial Fibrillation (ICD-427.31) Lab Used: [facility] Site:  PT 26.1 INR RANGE 2.0-3.0  Dietary changes: no    Health status changes: no    Bleeding/hemorrhagic complications: no    Recent/future hospitalizations: no    Any changes in medication regimen? no    Recent/future dental: no  Any missed doses?: no       Is patient compliant with meds? yes       Allergies: No Known Drug Allergies  Anticoagulation Management History:      Her anticoagulation is being managed by telephone today.  Positive risk factors for bleeding include an age of 74 years or older and presence of serious comorbidities.  The bleeding index is 'intermediate risk'.  Positive CHADS2 values include History of HTN and History of Diabetes.  Negative CHADS2 values include Age > 54 years old.  The start date was 09/21/2008.  Her last INR was 2.42 and today's INR is 2.42.  Prothrombin time is 26.1.  Anticoagulation responsible provider: [place] Loh[place].    Anticoagulation Management Assessment/Plan:      The patient's current anticoagulation dose is Coumadin 4 mg tabs: 1 by mouth once daily as directed by coumadin clinic.  The target INR is 2 - 3.  The next INR is due 02/04/2010.  Anticoagulation instructions were given to patient.  Results were reviewed/authorized by Gabriel Cirri, RN, BSN.  She was notified by Gabriel Cirri, RN, BSN.         Prior Anticoagulation Instructions: The patient is to continue with the same dose of coumadin.  This dosage includes: coumadin 4mg  daily  Current Anticoagulation Instructions: The patient is to continue with the same dose of coumadin.  This dosage includes: coumadin 4 mg daily

## 2011-01-03 NOTE — Assessment & Plan Note (Signed)
Summary: rov  Medications Added METFORMIN HCL 500 MG XR24H-TAB (METFORMIN HCL) 1 by mouth two times a day      Allergies Added: NKDA  Visit Type:  Follow-up Primary Provider:  Maryland Pink, MD  CC:  sob with bending over; edema all the time especially the left ankle, no cp, and fast heart beat at night .  History of Present Illness: [name] [name] is seen in followup for symptomatic palpitations including atrial fibrillation and PVCs. She is status post pacemaker implantation.  At her last visit we tried to ameliorate symptoms by increasing resting rate to 75 bpm  this was not successful.  She describes episodes of palpitations mostly at night, and last ing  for a while, notwithstanding the evidence from our interrogations being of durationless than 2 min    She also complains of fatigue, and we note that she is on high dose metoprolol succinate.  Current Problems (verified): 1)  Premature Ventricular Contractions  (ICD-427.69) 2)  Atrial Fibrillation  (ICD-427.31) 3)  Sick Sinus/ Tachy-brady Syndrome  (ICD-427.81) 4)  Edema  (ICD-782.3) 5)  Encounter For Long-term Use of Other Medications  (ICD-V58.69) 6)  Hypertension, Unspecified  (ICD-401.9) 7)  Pacemaker  (ICD-V45.Marland Kitchen01) 8)  Gerd  (ICD-530.81) 9)  Glaucoma  (ICD-365.9) 10)  Diabetes Mellitus  (ICD-250.00)  Current Medications (verified): 1)  Metoprolol Succinate 100 Mg Xr24h-Tab (Metoprolol Succinate) .... Take One Tablet By Mouth Twice A Day 2)  Metformin Hcl 500 Mg Xr24h-Tab (Metformin Hcl) .Marland Kitchen.. 1 By Mouth Two Times A Day 3)  Nexium 40 Mg Cpdr (Esomeprazole Magnesium) .... Once Daily 4)  Lipitor 20 Mg Tabs (Atorvastatin Calcium) .... Take One Tablet By Mouth Daily. 5)  Xalatan 0.005 % Soln (Latanoprost) .... At Bedtime 6)  Hematinic Plus Vit/minerals  Tabs (B Complex-C-Min-Fe-Fa) .... Once Daily 7)  Diltiazem Hcl Er Beads 240 Mg Xr24h-Cap (Diltiazem Hcl Er Beads) .... Take One Capsule By Mouth Daily 8)  Alprazolam 0.5 Mg Tabs  (Alprazolam) .... 1/2 -1 As Needed 9)  Coumadin 4 Mg Tabs (Warfarin Sodium) .Marland Kitchen.. 1 By Mouth Once Daily As Directed By Coumadin Clinic 10)  Benicar Hct 40-12.5 Mg Tabs (Olmesartan Medoxomil-Hctz) .Marland Kitchen.. 1 By Mouth Once Daily  Allergies (verified): No Known Drug Allergies  Past History:  Past Medical History: Last updated: 04/13/2009 SICK SINUS/ TACHY-BRADY SYNDROME (ICD-427.81) ATRIAL FIBRILLATION (ICD-427.31) HYPERTENSION, UNSPECIFIED (ICD-401.9) PACEMAKER (ICD-V45.Marland Kitchen01) GERD (ICD-530.81) GLAUCOMA (ICD-365.9) DIABETES MELLITUS (ICD-250.00)    Past Surgical History: Last updated: 04/13/2009 hysterectomy - 1970 cyst on right leg - 03/1998 pacemaker - 2008  Family History: Last updated: 04/13/2009 Father: cardiac arrest  Social History: Last updated: 04/13/2009 Retired  Widowed  Tobacco Use - Former.  Alcohol Use - no Regular Exercise - yes Drug Use - no  Risk Factors: Exercise: yes (04/13/2009)  Risk Factors: Smoking Status: quit (04/13/2009)  Vital Signs:  Patient profile:   76 year old female Height:      65 inches Weight:      243 pounds BMI:     40.58 Pulse rate:   78 / minute Pulse rhythm:   regular BP sitting:   136 / 76  (left arm) Cuff size:   large  Vitals Entered By: Philemon Kingdom (January 17, 2010 11:48 AM)  Physical Exam  General:  The patient was alert and oriented in no acute distress. HEENT Normal.  Neck veins were flat, carotids were brisk.  Lungs were clear.  Heart sounds were regular without murmurs or gallops.  Abdomen was soft  with active bowel sounds. There is no clubbing cyanosis or edema. Skin Warm and dry    PPM Specifications Following MD:  Virl Axe, MD     PPM Vendor:  [employer]     PPM Model Number:  [id number]     PPM Serial Number:  [id number] PPM DOI:  12/20/2006      Lead 1    Location: RA     DOI: 12/20/2006     Model #: [id number]     Serial #: [id number]     Status: active Lead 2    Location: RV     DOI: 12/20/2006      Model #: [id number]     Serial #: [id number]     Status: active  Magnet Response Rate:  BOL 85 ERI  65  Indications:  Sick sinus syndrome +Coumadin   PPM Follow Up Remote Check?  No Battery Voltage:  3.00 V     Pacer Dependent:  Yes       PPM Device Measurements Atrium  Amplitude: 2.3 mV, Impedance: 400 ohms, Threshold: 1.0 V at 0.4 msec Right Ventricle  Amplitude: 6.6 mV, Impedance: 576 ohms, Threshold: 1.0 V at 0.4 msec  Episodes MS Episodes:  1     Percent Mode Switch:  <0.1%     Coumadin:  Yes Ventricular High Rate:  0     Atrial Pacing:  98.5%     Ventricular Pacing:  44%  Parameters Mode:  DDDR     Lower Rate Limit:  75     Upper Rate Limit:  100 Paced AV Delay:  340     Sensed AV Delay:  310 Next Cardiology Appt Due:  07/04/2010 Tech Comments:  No parameter changes.  The patient is c/o waking to fast and skipping heart beats.  She has had one mode switch episode lasting 1:31 minutes.  She has also had 0.3 PVC runs/hour and 5.0 single PVC's /hour.  Atrial sensitivity reprogrammed 1.26mV to correct FFRW.  Rate response blunted but [name] [name] has been ill with intestinal flu and sinus infection since 12/10.  She will return in 6 months to the [place] clinic. Alma Friendly, LPN  February 14, [id number] 12:04 PM   Impression & Recommendations:  Problem # 1:  SICK SINUS/ TACHY-BRADY SYNDROME (ICD-427.81) We walked her in the hall;  she achieved a heart rate of only 80 so i incresed her slope from 5->6  Her updated medication list for this problem includes:    Metoprolol Succinate 100 Mg Xr24h-tab (Metoprolol succinate) .Marland Kitchen... Take one tablet by mouth twice a day    Diltiazem Hcl Er Beads 240 Mg Xr24h-cap (Diltiazem hcl er beads) .Marland Kitchen... Take one capsule by mouth daily    Coumadin 4 Mg Tabs (Warfarin sodium) .Marland Kitchen... 1 by mouth once daily as directed by coumadin clinic  Problem # 2:  ATRIAL FIBRILLATION (ICD-427.31) She has brief episodes of atrial tachycardia which are relativeily slow of of  CL 240-260; would continue current meds Her updated medication list for this problem includes:    Metoprolol Succinate 100 Mg Xr24h-tab (Metoprolol succinate) .Marland Kitchen... Take one tablet by mouth twice a day    Coumadin 4 Mg Tabs (Warfarin sodium) .Marland Kitchen... 1 by mouth once daily as directed by coumadin clinic  Problem # 3:  PREMATURE VENTRICULAR CONTRACTIONS (ICD-427.69) noted.  I also wonder wheether her symptoms are in fact the tachycardia or ventricular pacing which occurs with the mode switch and then comprises 45% of her total beats.  I have reprogrammed her device to DDIR and lengthened the av DELAY  to 350; hopefully this will not encroach upon her ocnduction limits. Her updated medication list for this problem includes:    Metoprolol Succinate 100 Mg Xr24h-tab (Metoprolol succinate) .Marland Kitchen... Take one tablet by mouth twice a day    Diltiazem Hcl Er Beads 240 Mg Xr24h-cap (Diltiazem hcl er beads) .Marland Kitchen... Take one capsule by mouth daily    Coumadin 4 Mg Tabs (Warfarin sodium) .Marland Kitchen... 1 by mouth once daily as directed by coumadin clinic  Problem # 4:  PACEMAKER (ICD-V45.Marland Kitchen01) Device parameters and data were reviewed and the abvove changes were made.

## 2011-01-03 NOTE — Progress Notes (Signed)
Summary: QUESTIONS   Phone Note Call from Patient Call back at Home Phone [phone]   Caller: SELF Call For: [name] [name] Summary of Call: [place] TEST THAT SHE IS HAVING DONE TOMORROW. Initial call taken by: Zenovia Jarred,  February 02, 2010 8:55 AM  Follow-up for Phone Call        pt concerned that she would be going inside a machine for her PFTs.  instructed pt that she would just be putting a mouth piece in her mouth. Follow-up by: Gabriel Cirri, RN, BSN,  February 02, 2010 9:05 AM

## 2011-01-03 NOTE — Progress Notes (Signed)
Summary: f/u on ROV   Phone Note Other Incoming   Summary of Call: called pt to let her know about PFT appt.  Pt states that she did not stop her dilt as instructed by Dr. Rockey Situ because she was afraid that it would cause her to wake up in the middle of the night with a fast heart beat.  Pt wonders if her SOB could be related to a blockage.  Instrcted pt that we would get results of PFT's and determine next step from there.   Initial call taken by: Gabriel Cirri, RN, BSN,  February 01, 2010 9:15 AM  Follow-up for Phone Call        Negative stress test recently. Negative cath years ago. Likely not a blockage. Needs f/u with Erick Alley after PFTs (pacer check)

## 2011-01-03 NOTE — Assessment & Plan Note (Signed)
Summary: F6M/AMD  Medications Added METOPROLOL SUCCINATE 50 MG XR24H-TAB (METOPROLOL SUCCINATE) take one tablet  two times a day * C-PAP at bedtime MOMETASONE FUROATE 0.1 % CREA (MOMETASONE FUROATE) apply to ear as directed two times a day for 10 days as needed MOMETASONE FUROATE 0.1 % SOLN (MOMETASONE FUROATE) place 4 drops into itching ear two times a day for 10 days as needed      Allergies Added: NKDA  Visit Type:  Follow-up Referring Lelar Farewell:  Caryl Comes Primary Analis Distler:  [place] Pink, MD  CC:  Denies chest pain or shortness of breath..  History of Present Illness: [name] [name] is seen in followup for a history of atrial fibrillation, symptomatic palpitations, PVCs, status post pacemaker implantation for sick sinus syndrome/tachybradycardia syndrome with chronic symptoms of palpitations, shortness of breath with exertion, who presents for routine followup.  Earlier in the year, she had significant symptoms of shortness of breath and palpitations. In May, she had adjustments to her pacemaker and since that time has felt back to normal. She denies symptoms of shortness of breath or palpitations. She is active, walking frequently and has no complaints. Today she does have a head cold.   her cardiac workup in the past has included  cardiac catheterization in 2004, stress testing,   echo done recently shows normal systolic function, mild LVH, diastolic relaxation abnormality, mildly elevated right ventricular systolic pressures, mild MR.  She had a treadmill study showing normal blood pressure response to exercise, chronotropic incompetence with peak heart rate of 100 beats per minute, resting heart rate in the 70s.  Previous  PFTs  demonstrated extrinsic compression/restrictive physiology.  EKG shows normal sinus rhythm with rate 63 beats per minute, no significant ST or T wave changes      Current Medications (verified): 1)  Metoprolol Succinate 50 Mg Xr24h-Tab (Metoprolol  Succinate) .... Take One Tablet  Two Times A Day 2)  Metformin Hcl 500 Mg Xr24h-Tab (Metformin Hcl) .Marland Kitchen.. 1 By Mouth Two Times A Day 3)  Nexium 40 Mg Cpdr (Esomeprazole Magnesium) .Marland Kitchen.. 1 Tab Two Times A Day 4)  Lipitor 20 Mg Tabs (Atorvastatin Calcium) .... Take One Tablet By Mouth Daily. 5)  Travatan Z 0.004 % Soln (Travoprost) .... As Directed 6)  Hematinic Plus Vit/minerals  Tabs (B Complex-C-Min-Fe-Fa) .... Once Daily 7)  Diltiazem Hcl Er Beads 240 Mg Xr24h-Cap (Diltiazem Hcl Er Beads) .... Take One Capsule By Mouth Daily 8)  Alprazolam 0.5 Mg Tabs (Alprazolam) .... 1/2 -1 As Needed 9)  Coumadin 4 Mg Tabs (Warfarin Sodium) .Marland Kitchen.. 1 By Mouth Once Daily As Directed By Coumadin Clinic 10)  Benicar Hct 40-12.5 Mg Tabs (Olmesartan Medoxomil-Hctz) .Marland Kitchen.. 1 By Mouth Once Daily 11)  Fish Oil 1000 Mg Caps (Omega-3 Fatty Acids) .Marland Kitchen.. 1 By Mouth Once Daily 12)  Januvia 100 Mg Tabs (Sitagliptin Phosphate) .... Take 1 By Mouth Once Daily 13)  C-Pap .... At Bedtime 14)  Mometasone Furoate 0.1 % Crea (Mometasone Furoate) .... Apply To Ear As Directed Two Times A Day For 10 Days As Needed 15)  Mometasone Furoate 0.1 % Soln (Mometasone Furoate) .... Place 4 Drops Into Itching Ear Two Times A Day For 10 Days As Needed  Allergies (verified): No Known Drug Allergies  Past History:  Past Medical History: Last updated: 03/14/2010 SICK SINUS/ TACHY-BRADY SYNDROME (ICD-427.81) ATRIAL FIBRILLATION (ICD-427.31) HYPERTENSION, UNSPECIFIED (ICD-401.9) PACEMAKER (ICD-V45.Marland Kitchen01) GERD (ICD-530.81) GLAUCOMA (ICD-365.9) DIABETES MELLITUS (ICD-250.00 obstructive sleep apnea obesity Palpitations  Past Surgical History: Last updated: 04/13/2009 hysterectomy - 1970 cyst  on right leg - 03/1998 pacemaker - 2008  Family History: Last updated: 04/13/2009 Father: cardiac arrest  Social History: Last updated: 04/13/2009 Retired  Widowed  Tobacco Use - Former.  Alcohol Use - no Regular Exercise - yes Drug Use -  no  Risk Factors: Exercise: yes (04/13/2009)  Risk Factors: Smoking Status: quit (04/13/2009)  Review of Systems  The patient denies fever, weight loss, weight gain, vision loss, decreased hearing, hoarseness, chest pain, syncope, dyspnea on exertion, peripheral edema, prolonged cough, abdominal pain, incontinence, muscle weakness, depression, and enlarged lymph nodes.         head cold  Vital Signs:  Patient profile:   76 year old female Height:      65 inches Pulse rate:   65 / minute BP sitting:   139 / 82  (left arm) Cuff size:   large  Vitals Entered By: Dolores Lory, CMA (October 03, 2010 11:28 AM)  Physical Exam  General:  well-appearing woman in no apparent distress, HEENT exam is benign, oropharynx is clear, neck is supple with no JVP or carotid bruits, heart sounds are regular with S1-S2 and no murmurs appreciated, lungs are clear to auscultation with no wheezes or rales, abdominal exam is notable for obesity though otherwise benign, no significant lower extremity edema, neurologic exam is nonfocal, skin is warm and dry, pulses are equal and symmetrical in her upper and lower extremities.   PPM Specifications Following MD:  Virl Axe, MD     PPM Vendor:  [employer]     PPM Model Number:  [id number]     PPM Serial Number:  [id number] PPM DOI:  12/20/2006      Lead 1    Location: RA     DOI: 12/20/2006     Model #: [id number]     Serial #: [id number]     Status: active Lead 2    Location: RV     DOI: 12/20/2006     Model #: [id number]     Serial #: [id number]     Status: active  Magnet Response Rate:  BOL 85 ERI  65  Indications:  Sick sinus syndrome +Coumadin   PPM Follow Up Pacer Dependent:  Yes      Episodes Coumadin:  Yes  Parameters Mode:  DDDR     Lower Rate Limit:  60     Upper Rate Limit:  110 Paced AV Delay:  150     Sensed AV Delay:  130 Rate Response Parameters:  Rate response 7 Activity threshold low  Impression & Recommendations:  Problem # 1:  ATRIAL  FIBRILLATION (ICD-427.31) she does have a history of atrial fibrillation. EKG shows a paced rhythm. She feels well after changes made to her pacer in May  Her updated medication list for this problem includes:    Metoprolol Succinate 50 Mg Xr24h-tab (Metoprolol succinate) .Marland Kitchen... Take one tablet  two times a day    Coumadin 4 Mg Tabs (Warfarin sodium) .Marland Kitchen... 1 by mouth once daily as directed by coumadin clinic  Problem # 2:  DYSPNEA (ICD-786.05) Shortness of breath has resolved following changes made to her pacemaker in May  The following medications were removed from the medication list:    Furosemide 20 Mg Tabs (Furosemide) .Marland Kitchen... Take one tablet by mouth daily as needed Her updated medication list for this problem includes:    Metoprolol Succinate 50 Mg Xr24h-tab (Metoprolol succinate) .Marland Kitchen... Take one tablet  two times a day    Diltiazem Hcl Er  Beads 240 Mg Xr24h-cap (Diltiazem hcl er beads) .Marland Kitchen... Take one capsule by mouth daily    Benicar Hct 40-12.5 Mg Tabs (Olmesartan medoxomil-hctz) .Marland Kitchen... 1 by mouth once daily  Problem # 3:  HYPERTENSION, UNSPECIFIED (ICD-401.9) Blood pressure is reasonably well controlled on her current medication regimen. She is able to afford her medications.  The following medications were removed from the medication list:    Furosemide 20 Mg Tabs (Furosemide) .Marland Kitchen... Take one tablet by mouth daily as needed Her updated medication list for this problem includes:    Metoprolol Succinate 50 Mg Xr24h-tab (Metoprolol succinate) .Marland Kitchen... Take one tablet  two times a day    Diltiazem Hcl Er Beads 240 Mg Xr24h-cap (Diltiazem hcl er beads) .Marland Kitchen... Take one capsule by mouth daily    Benicar Hct 40-12.5 Mg Tabs (Olmesartan medoxomil-hctz) .Marland Kitchen... 1 by mouth once daily  Problem # 4:  DIABETES MELLITUS (ICD-250.00) She reports that her diabetes is well controlled with hemoglobin A1c 6.3. We will try to obtain her most recent lipid panel from records from Dr. Kary Kos.  Her updated  medication list for this problem includes:    Metformin Hcl 500 Mg Xr24h-tab (Metformin hcl) .Marland Kitchen... 1 by mouth two times a day    Benicar Hct 40-12.5 Mg Tabs (Olmesartan medoxomil-hctz) .Marland Kitchen... 1 by mouth once daily    Januvia 100 Mg Tabs (Sitagliptin phosphate) .Marland Kitchen... Take 1 by mouth once daily  Patient Instructions: 1)  Your physician recommends that you continue on your current medications as directed. Please refer to the Current Medication list given to you today. 2)  Your physician wants you to follow-up in:  1 year  You will receive a reminder letter in the mail two months in advance. If you don't receive a letter, please call our office to schedule the follow-up appointment.

## 2011-01-03 NOTE — Progress Notes (Signed)
Summary: call back   Phone Note Call from Patient Call back at Home Phone [phone]   Caller: self Call For: Orhan Mayorga Summary of Call: would like a call from [facility] Initial call taken by: Zenovia Jarred,  January 07, 2010 2:19 PM  Follow-up for Phone Call        calling for CCR  Follow-up by: Gabriel Cirri, RN, BSN,  January 07, 2010 2:41 PM

## 2011-01-03 NOTE — Progress Notes (Signed)
   Phone Note Call from Patient   Summary of Call: Pt had sent in a RX for Flecainide 50. I called pt to question her dose. Per CM, he wanted her to increase her Flecainide to 100 two times a day. Ms. Johnston did not ever increase dose. She is going to pick up her RX and take a 1/2 of dose, till she sees Dr Caryl Comes. She says that she dose not want to increase the dose because she feels well on 50mg . She still has some episodes, but don't want to increase. KL:) Initial call taken by: Philemon Kingdom,  May 21, 2009 12:23 PM

## 2011-01-03 NOTE — Procedures (Signed)
Summary: pacer check/medtronic      Allergies Added: NKDA  Current Medications (verified): 1)  Metoprolol Succinate 100 Mg Xr24h-Tab (Metoprolol Succinate) .... Take 1/2 Tablet By Mouth Twice A Day 2)  Metformin Hcl 500 Mg Xr24h-Tab (Metformin Hcl) .Marland Kitchen.. 1 By Mouth Two Times A Day 3)  Nexium 40 Mg Cpdr (Esomeprazole Magnesium) .Marland Kitchen.. 1 Tab Two Times A Day 4)  Lipitor 20 Mg Tabs (Atorvastatin Calcium) .... Take One Tablet By Mouth Daily. 5)  Travatan Z 0.004 % Soln (Travoprost) .... As Directed 6)  Hematinic Plus Vit/minerals  Tabs (B Complex-C-Min-Fe-Fa) .... Once Daily 7)  Diltiazem Hcl Er Beads 240 Mg Xr24h-Cap (Diltiazem Hcl Er Beads) .... Take One Capsule By Mouth Daily 8)  Alprazolam 0.5 Mg Tabs (Alprazolam) .... 1/2 -1 As Needed 9)  Coumadin 4 Mg Tabs (Warfarin Sodium) .Marland Kitchen.. 1 By Mouth Once Daily As Directed By Coumadin Clinic 10)  Benicar Hct 40-12.5 Mg Tabs (Olmesartan Medoxomil-Hctz) .Marland Kitchen.. 1 By Mouth Once Daily 11)  Fish Oil 1000 Mg Caps (Omega-3 Fatty Acids) .Marland Kitchen.. 1 By Mouth Once Daily 12)  Januvia 100 Mg Tabs (Sitagliptin Phosphate) .... Take 1 By Mouth Once Daily 13)  Furosemide 20 Mg Tabs (Furosemide) .... Take One Tablet By Mouth Daily As Needed  Allergies (verified): No Known Drug Allergies  PPM Specifications Following MD:  Virl Axe, MD     PPM Vendor:  [employer]     PPM Model Number:  [id number]     PPM Serial Number:  [id number] PPM DOI:  12/20/2006      Lead 1    Location: RA     DOI: 12/20/2006     Model #: [id number]     Serial #: [id number]     Status: active Lead 2    Location: RV     DOI: 12/20/2006     Model #: [id number]     Serial #: [id number]     Status: active  Magnet Response Rate:  BOL 85 ERI  65  Indications:  Sick sinus syndrome +Coumadin   PPM Follow Up Remote Check?  No Battery Voltage:  3.00 V     Pacer Dependent:  Yes       PPM Device Measurements Atrium  Impedance: 504 ohms,  Right Ventricle  Impedance: 568 ohms,   Episodes MS Episodes:  2      Percent Mode Switch:  <0.1%     Coumadin:  Yes Ventricular High Rate:  0      Parameters Mode:  DDDR     Lower Rate Limit:  60     Upper Rate Limit:  110 Paced AV Delay:  150     Sensed AV Delay:  130 Rate Response Parameters:  Rate response 7 Activity threshold low Tech Comments:  Ms. Tiley was seen 5/23 for c/o SOB with minimal exertion.  Her lower rate was reprogrammed from 70 to 60 and her rate response reprogrammed 9 and low for a blunted histogram.  She called today with the same complaints along with rapid heart rates while walking.  She was indeed short winded just walking from the waitiing room although her heart rate was 90 and her histogram was better.    I changed her upper/sensor rate to 110 and rate response from 9 to 7.  We will try this and see this relieves some of her symptoms. Alma Friendly, LPN  May 26, [id number] PM

## 2011-01-03 NOTE — Medication Information (Signed)
Summary: Coumadin Clinic   Anticoagulant Therapy  Managed by: Gabriel Cirri, RN, BSN PCP: Lovie Macadamia, MD Supervising MD: Aundra Dubin MD, [place] Indication 1: Atrial Fibrillation (ICD-427.31) Lab Used: [facility] Site: [place] [facility] 28.6 INR RANGE 2.0-3.0  Dietary changes: no    Health status changes: no    Bleeding/hemorrhagic complications: no    Recent/future hospitalizations: no    Any changes in medication regimen? no    Recent/future dental: no  Any missed doses?: no       Is patient compliant with meds? yes       Allergies: No Known Drug Allergies  Anticoagulation Management History:      Her anticoagulation is being managed by telephone today.  Positive risk factors for bleeding include an age of 74 years or older and presence of serious comorbidities.  The bleeding index is 'intermediate risk'.  Positive CHADS2 values include History of HTN and History of Diabetes.  Negative CHADS2 values include Age > 74 years old.  The start date was 09/21/2008.  Her last INR was 2.72 and today's INR is 2.72.  Prothrombin time is 28.6.  Anticoagulation responsible provider: Aundra Dubin MD, [place].    Anticoagulation Management Assessment/Plan:      The patient's current anticoagulation dose is Coumadin 4 mg tabs: 1 by mouth once daily as directed by coumadin clinic.  The target INR is 2 - 3.  The next INR is due 12/10/2009.  Anticoagulation instructions were given to patient.  Results were reviewed/authorized by Gabriel Cirri, RN, BSN.  She was notified by Gabriel Cirri, RN, BSN.         Prior Anticoagulation Instructions: The patient is to continue with the same dose of coumadin.  This dosage includes: coumadin 4 mg daily   Current Anticoagulation Instructions: The patient is to continue with the same dose of coumadin.  This dosage includes: coumadin 4 mg daily

## 2011-01-03 NOTE — Medication Information (Signed)
Summary: Coumadin Clinic   Anticoagulant Therapy  Managed by: Gabriel Cirri, RN, BSN PCP: Lovie Macadamia, MD Supervising MD: Aundra Dubin MD, Salimah Martinovich Indication 1: Atrial Fibrillation (ICD-427.31) Lab Used: [facility] Site: [place] [facility] 29.1 INR RANGE 2.0-3.0  Dietary changes: no    Health status changes: no    Bleeding/hemorrhagic complications: no    Recent/future hospitalizations: no    Any changes in medication regimen? no    Recent/future dental: no  Any missed doses?: no       Is patient compliant with meds? yes       Allergies: No Known Drug Allergies  Anticoagulation Management History:      The patient is taking warfarin and comes in today for a routine follow up visit.  Positive risk factors for bleeding include an age of 74 years or older and presence of serious comorbidities.  The bleeding index is 'intermediate risk'.  Positive CHADS2 values include History of HTN and History of Diabetes.  Negative CHADS2 values include Age > 32 years old.  The start date was 09/21/2008.  Her last INR was 2.78 and today's INR is 2.78.  Prothrombin time is 29.1.  Anticoagulation responsible provider: Aundra Dubin MD, Masaji Billups.    Anticoagulation Management Assessment/Plan:      The patient's current anticoagulation dose is Coumadin 4 mg tabs: 1 by mouth once daily as directed by coumadin clinic.  The target INR is 2 - 3.  The next INR is due 01/07/2010.  Anticoagulation instructions were given to patient.  Results were reviewed/authorized by Gabriel Cirri, RN, BSN.  She was notified by Gabriel Cirri, RN, BSN.         Prior Anticoagulation Instructions: The patient is to continue with the same dose of coumadin.  This dosage includes: coumadin 4 mg daily  Current Anticoagulation Instructions: The patient is to continue with the same dose of coumadin.  This dosage includes: coumadin 4mg  daily

## 2011-01-05 NOTE — Progress Notes (Signed)
Summary: Called pt   Phone Note Outgoing Call Call back at [facility] Phone [phone]   Call placed by: Zenovia Jarred,  December 20, 2010 2:46 PM Call placed to: Patient Summary of Call: LMOM TCB to schedule appt w/ Caryl Comes. Initial call taken by: Zenovia Jarred,  December 20, 2010 2:46 PM

## 2011-01-09 ENCOUNTER — Encounter: Payer: Self-pay | Admitting: Cardiovascular Disease

## 2011-01-09 ENCOUNTER — Ambulatory Visit (INDEPENDENT_AMBULATORY_CARE_PROVIDER_SITE_OTHER): Payer: MEDICARE | Admitting: Cardiovascular Disease

## 2011-01-09 DIAGNOSIS — I10 Essential (primary) hypertension: Secondary | ICD-10-CM

## 2011-01-09 DIAGNOSIS — Z950 Presence of cardiac pacemaker: Secondary | ICD-10-CM

## 2011-01-12 ENCOUNTER — Encounter (INDEPENDENT_AMBULATORY_CARE_PROVIDER_SITE_OTHER): Payer: MEDICARE

## 2011-01-12 ENCOUNTER — Encounter: Payer: Self-pay | Admitting: Internal Medicine

## 2011-01-12 DIAGNOSIS — I495 Sick sinus syndrome: Secondary | ICD-10-CM

## 2011-01-19 NOTE — Procedures (Signed)
Summary: 1:30 appt per Paula/PACER/AMD      Allergies Added: NKDA  Current Medications (verified): 1)  Metformin Hcl 500 Mg Xr24h-Tab (Metformin Hcl) .Marland Kitchen.. 1 By Mouth Two Times A Day 2)  Nexium 40 Mg Cpdr (Esomeprazole Magnesium) .Marland Kitchen.. 1 Tab Two Times A Day 3)  Lipitor 20 Mg Tabs (Atorvastatin Calcium) .... Take One Tablet By Mouth Daily. 4)  Travatan Z 0.004 % Soln (Travoprost) .... As Directed 5)  Hematinic Plus Vit/minerals  Tabs (B Complex-C-Min-Fe-Fa) .... Once Daily 6)  Diltiazem Hcl Er Beads 240 Mg Xr24h-Cap (Diltiazem Hcl Er Beads) .... Take One Capsule By Mouth Daily 7)  Alprazolam 0.5 Mg Tabs (Alprazolam) .... 1/2 -1 As Needed 8)  Coumadin 4 Mg Tabs (Warfarin Sodium) .Marland Kitchen.. 1 By Mouth Once Daily As Directed By Coumadin Clinic 9)  Benicar Hct 40-12.5 Mg Tabs (Olmesartan Medoxomil-Hctz) .Marland Kitchen.. 1 By Mouth Once Daily 10)  Fish Oil 1000 Mg Caps (Omega-3 Fatty Acids) .Marland Kitchen.. 1 By Mouth Once Daily 11)  Januvia 100 Mg Tabs (Sitagliptin Phosphate) .... Take 1 By Mouth Once Daily 12)  C-Pap .... At Bedtime 13)  Mometasone Furoate 0.1 % Crea (Mometasone Furoate) .... Apply To Ear As Directed Two Times A Day For 10 Days As Needed 14)  Mometasone Furoate 0.1 % Soln (Mometasone Furoate) .... Place 4 Drops Into Itching Ear Two Times A Day For 10 Days As Needed 15)  Uloric 40 Mg Tabs (Febuxostat) .Marland Kitchen.. 1 Tablet As Needed 16)  Nasonex 50 Mcg/act Susp (Mometasone Furoate) .... 2 Sprays in Both Nostrils Once Daily As Needed 17)  Bystolic 20 Mg Tabs (Nebivolol Hcl)  Allergies (verified): No Known Drug Allergies   PPM Specifications Following MD:  Virl Axe, MD     PPM Vendor:  [employer]     PPM Model Number:  [id number]     PPM Serial Number:  [id number] PPM DOI:  12/20/2006      Lead 1    Location: RA     DOI: 12/20/2006     Model #: [id number]     Serial #: [id number]     Status: active Lead 2    Location: RV     DOI: 12/20/2006     Model #: [id number]     Serial #: [id number]     Status: active  Magnet  Response Rate:  BOL 85 ERI  65  Indications:  Sick sinus syndrome +Coumadin   PPM Follow Up Remote Check?  No Battery Voltage:  2.97 V     Pacer Dependent:  Yes       PPM Device Measurements Atrium  Amplitude: 1.8 mV, Impedance: 544 ohms, Threshold: 1.0 V at 0.4 msec Right Ventricle  Amplitude: 6.0 mV, Impedance: 568 ohms, Threshold: 1.0 V at 0.4 msec  Episodes MS Episodes:  4     Coumadin:  Yes Ventricular High Rate:  2     Atrial Pacing:  91.6%     Ventricular Pacing:  0.8%  Parameters Mode:  DDDR     Lower Rate Limit:  60     Upper Rate Limit:  110 Paced AV Delay:  150     Sensed AV Delay:  130 Rate Response Parameters:  Rate response 7 Activity threshold low Tech Comments:  No parameter changes.  Mode switch total time 3 minutes.  ROV 6 months with Dr. Caryl Comes in [place]. Alma Friendly, LPN  February  9, [id number] 1:26 PM

## 2011-01-19 NOTE — Assessment & Plan Note (Signed)
Summary: ? medication per pt /sab  Medications Added ULORIC 40 MG TABS (FEBUXOSTAT) 1 tablet as needed NASONEX 50 MCG/ACT SUSP (MOMETASONE FUROATE) 2 sprays in both nostrils once daily as needed BYSTOLIC 20 MG TABS (NEBIVOLOL HCL)       Allergies Added: NKDA  Visit Type:  Follow-up Referring Provider:  Caryl Comes Primary Provider:  [place] Pink, MD  CC:  c/o irregular heart beats and doesn't happen all the time. denies chest pain and SOB.Marland Kitchen  History of Present Illness: [name] [name] is seen in followup for a history of atrial fibrillation, symptomatic palpitations, PVCs, status post pacemaker implantation for sick sinus syndrome/tachybradycardia syndrome with chronic symptoms of palpitations, shortness of breath with exertion, who presents for routine followup.  Earlier in the year, she had significant symptoms of shortness of breath and palpitations. In May, she had adjustments to her pacemaker and since that time has felt back to normal.   she presents today with numerous complaints. She has a pounding in her ears typically when she lies down at night time. It is there in the morning, sometimes she describes it as a whistle. Blood pressure at home has typically been well controlled in the 130s over 70s. She has severe nightmares sometimes on metoprolol. She would like to change the medication. She had TMJ symptoms. She also has sharp pains in her mid cervical spine.  her cardiac workup in the past has included  cardiac catheterization in 2004, stress testing,   echo done recently shows normal systolic function, mild LVH, diastolic relaxation abnormality, mildly elevated right ventricular systolic pressures, mild MR.  She had a treadmill study showing normal blood pressure response to exercise, chronotropic incompetence with peak heart rate of 100 beats per minute, resting heart rate in the 70s.  Previous  PFTs  demonstrated extrinsic compression/restrictive physiology.  EKG shows Paced  rhythm,  rate 64 beats per minute, no significant ST or T wave changes      Current Medications (verified): 1)  Metoprolol Succinate 50 Mg Xr24h-Tab (Metoprolol Succinate) .... Take One Tablet  Two Times A Day 2)  Metformin Hcl 500 Mg Xr24h-Tab (Metformin Hcl) .Marland Kitchen.. 1 By Mouth Two Times A Day 3)  Nexium 40 Mg Cpdr (Esomeprazole Magnesium) .Marland Kitchen.. 1 Tab Two Times A Day 4)  Lipitor 20 Mg Tabs (Atorvastatin Calcium) .... Take One Tablet By Mouth Daily. 5)  Travatan Z 0.004 % Soln (Travoprost) .... As Directed 6)  Hematinic Plus Vit/minerals  Tabs (B Complex-C-Min-Fe-Fa) .... Once Daily 7)  Diltiazem Hcl Er Beads 240 Mg Xr24h-Cap (Diltiazem Hcl Er Beads) .... Take One Capsule By Mouth Daily 8)  Alprazolam 0.5 Mg Tabs (Alprazolam) .... 1/2 -1 As Needed 9)  Coumadin 4 Mg Tabs (Warfarin Sodium) .Marland Kitchen.. 1 By Mouth Once Daily As Directed By [facility] 10)  Benicar Hct 40-12.5 Mg Tabs (Olmesartan Medoxomil-Hctz) .Marland Kitchen.. 1 By Mouth Once Daily 11)  Fish Oil 1000 Mg Caps (Omega-3 Fatty Acids) .Marland Kitchen.. 1 By Mouth Once Daily 12)  Januvia 100 Mg Tabs (Sitagliptin Phosphate) .... Take 1 By Mouth Once Daily 13)  C-Pap .... At Bedtime 14)  Mometasone Furoate 0.1 % Crea (Mometasone Furoate) .... Apply To Ear As Directed Two Times A Day For 10 Days As Needed 15)  Mometasone Furoate 0.1 % Soln (Mometasone Furoate) .... Place 4 Drops Into Itching Ear Two Times A Day For 10 Days As Needed 16)  Uloric 40 Mg Tabs (Febuxostat) .Marland Kitchen.. 1 Tablet As Needed 17)  Nasonex 50 Mcg/act Susp (Mometasone Furoate) .Marland KitchenMarland KitchenMarland Kitchen  2 Sprays in Both Nostrils Once Daily As Needed  Allergies (verified): No Known Drug Allergies  Past History:  Past Medical History: Last updated: 03/14/2010 SICK SINUS/ TACHY-BRADY SYNDROME (ICD-427.81) ATRIAL FIBRILLATION (ICD-427.31) HYPERTENSION, UNSPECIFIED (ICD-401.9) PACEMAKER (ICD-V45.Marland Kitchen01) GERD (ICD-530.81) GLAUCOMA (ICD-365.9) DIABETES MELLITUS (ICD-250.00 obstructive sleep  apnea obesity Palpitations  Past Surgical History: Last updated: 04/13/2009 hysterectomy - 1970 cyst on right leg - 03/1998 pacemaker - 2008  Family History: Last updated: 04/13/2009 Father: cardiac arrest  Social History: Last updated: 04/13/2009 Retired  Widowed  Tobacco Use - Former.  Alcohol Use - no Regular Exercise - yes Drug Use - no  Risk Factors: Exercise: yes (04/13/2009)  Risk Factors: Smoking Status: quit (04/13/2009)  Review of Systems  The patient denies fever, weight loss, weight gain, vision loss, decreased hearing, hoarseness, chest pain, syncope, dyspnea on exertion, peripheral edema, prolonged cough, abdominal pain, incontinence, muscle weakness, depression, and enlarged lymph nodes.         pounding in her ears, TMJ symptoms, sharp pain in her back, nightmares and insomnia  Vital Signs:  Patient profile:   76 year old female Height:      65 inches Weight:      298.75 pounds BMI:     49.89 Pulse rate:   64 / minute BP sitting:   144 / 85  (left arm) Cuff size:   large  Vitals Entered By: Rodman Comp CMA (January 09, 2011 3:31 PM)  Physical Exam  General:  well-appearing woman in no apparent distress, morbidly obese, HEENT exam is benign, oropharynx is clear, neck is supple with no JVP or carotid bruits, heart sounds are regular with S1-S2 and no murmurs appreciated, lungs are clear to auscultation with no wheezes or rales, abdominal exam is notable for obesity though otherwise benign, no significant lower extremity edema, neurologic exam is nonfocal, skin is warm and dry, pulses are equal and symmetrical in her upper and lower extremities. Neurologic:  Alert and oriented x 3. Skin:  Intact without lesions or rashes. Psych:  Normal affect.   PPM Specifications Following MD:  Virl Axe, MD     PPM Vendor:  Medtronic     PPM Model Number:  [id number]     PPM Serial Number:  [id number] PPM DOI:  12/20/2006      Lead 1    Location: [facility]     DOI:  12/20/2006     Model #: [id number]     Serial #: [id number]     Status: active Lead 2    Location: [facility]     DOI: 12/20/2006     Model #: [id number]     Serial #: [id number]     Status: active  Magnet Response Rate:  BOL 85 ERI  65  Indications:  Sick sinus syndrome +Coumadin   PPM Follow Up Pacer Dependent:  Yes      Episodes Coumadin:  Yes  Parameters Mode:  DDDR     Lower Rate Limit:  60     Upper Rate Limit:  110 Paced AV Delay:  150     Sensed AV Delay:  130 Rate Response Parameters:  Rate response 7 Activity threshold low  Impression & Recommendations:  Problem # 1:  PREMATURE VENTRICULAR CONTRACTIONS (ICD-427.69) she does continue to have palpitations which I suspect are ectopy and benign in nature. We have suggested she have her pacemaker checked as it has been one year since this was done.  The following medications were removed from the medication  list:    Metoprolol Succinate 50 Mg Xr24h-tab (Metoprolol succinate) .Marland Kitchen... Take one tablet  two times a day Her updated medication list for this problem includes:    Diltiazem Hcl Er Beads 240 Mg Xr24h-cap (Diltiazem hcl er beads) .Marland Kitchen... Take one capsule by mouth daily    Coumadin 4 Mg Tabs (Warfarin sodium) .Marland Kitchen... 1 by mouth once daily as directed by coumadin clinic    Bystolic 20 Mg Tabs (Nebivolol hcl)  Problem # 2:  SICK SINUS/ TACHY-BRADY SYNDROME (ICD-427.81) she has been doing better with her pacemaker settings from last year.  She does not have "nightmares "on metoprolol but I suspect she has chronic insomnia. She would like to change to metoprolol. Her blood pressure is also borderline elevated. I have suggested we try bystolic which has a low side effect profile and may also help her blood pressure. She will try 20 mg daily. I've asked her to contact our office with her blood pressure numbers.  The following medications were removed from the medication list:    Metoprolol Succinate 50 Mg Xr24h-tab (Metoprolol succinate) .Marland Kitchen... Take  one tablet  two times a day Her updated medication list for this problem includes:    Diltiazem Hcl Er Beads 240 Mg Xr24h-cap (Diltiazem hcl er beads) .Marland Kitchen... Take one capsule by mouth daily    Coumadin 4 Mg Tabs (Warfarin sodium) .Marland Kitchen... 1 by mouth once daily as directed by coumadin clinic    Bystolic 20 Mg Tabs (Nebivolol hcl)  Problem # 3:  HYPERTENSION, UNSPECIFIED (ICD-401.9) blood pressure is borderline elevated. We will change her beta blocker as above. Uncertain if her blood pressure is causing the pounding in her ears.   The following medications were removed from the medication list:    Metoprolol Succinate 50 Mg Xr24h-tab (Metoprolol succinate) .Marland Kitchen... Take one tablet  two times a day Her updated medication list for this problem includes:    Diltiazem Hcl Er Beads 240 Mg Xr24h-cap (Diltiazem hcl er beads) .Marland Kitchen... Take one capsule by mouth daily    Benicar Hct 40-12.5 Mg Tabs (Olmesartan medoxomil-hctz) .Marland Kitchen... 1 by mouth once daily    Bystolic 20 Mg Tabs (Nebivolol hcl)  Other Orders: EKG w/ Interpretation (93000)  Patient Instructions: 1)  Your physician recommends that you schedule a follow-up appointment in: 6 months 2)  Your physician has recommended you make the following change in your medication: HOLD Metoprolol. START Bystolic and notify office to call in script. 3)  You will be set up with an appt for pacemaker check with [facility]. Prescriptions: BYSTOLIC 20 MG TABS (NEBIVOLOL HCL)   #28 x 0   Entered by:   Darlyne Russian RN   Authorized by:   Esmond Plants MD   Signed by:   Darlyne Russian RN on 01/09/2011   Method used:   Samples Given   RxID:   [id number]

## 2011-03-07 ENCOUNTER — Other Ambulatory Visit: Payer: Self-pay | Admitting: Internal Medicine

## 2011-04-18 NOTE — Progress Notes (Signed)
[facility] OFFICE NOTE   NAME:[name], [name]                           MRN:          [id number]  DATE:04/19/2009                            DOB:          27-Jan-1935    [name] [name] is here today for unscheduled visit complaining of dizziness  and lightheadedness.  She is a very pleasant elderly woman with a  history of paroxysmal AFib and symptomatic bradycardia, who underwent  permanent pacemaker insertion in the past.  The patient has been on  increasing doses of flecainide initially 50 twice a day and now 100  twice daily.  The patient notes that she has had continued palpitations,  dizziness, and lightheadedness.  She had no frank syncope.   MEDICATIONS:  1. Avandia 4 mg daily.  2. Metformin 500 two tablets daily.  3. Nexium 40 a day.  4. Metoprolol ER 100 twice a day.  5. Lipitor 20 a day.  6. Coumadin as directed.  7. Flecainide 100 twice daily.  8. Diovan/HCTZ 320/12.5 daily.  9. Cardizem 240 a day.   PHYSICAL EXAMINATION:  GENERAL:  She is a pleasant woman in no acute  distress.  VITAL SIGNS:  Blood pressure 150/78, the pulse 70 and regular, and the  respirations were 18.  NECK:  No jugular venous distention.  LUNGS:  Clear bilaterally to auscultation.  No wheezes, rales, or  rhonchi are present.  There is no increased work of breathing.  CARDIOVASCULAR:  Regular rate and rhythm.  Normal S1 and S2.  ABDOMEN:  Soft and nontender.  EXTREMITIES:  No cyanosis, clubbing, or edema.  The pacemaker incision  was healed nicely.   The EKG demonstrates sinus rhythm with atrial pacing and ventricular  sensing.  Interrogation of her pacemaker was carried out today  demonstrates that she has had no sustained AFib.  She does have  occasional PACs and no VT were noted.   IMPRESSION:  1. Symptomatic bradycardia.  2. Status post pacemaker insertion.  3. Symptoms of fatigue and malaise, palpitations of  unclear etiology.   DISCUSSION:  Overall, [name] [name] is stable, but her symptoms are unclear  as to what their etiology is that she has recently been increased on her  flecainide.  I was concerned that perhaps she was having had VT,  possibly SVT, atrial flutter, or fibrillation.  However, there is no  evidence of any of these with interrogation of her cardiac pacemaker.  For now, I have recommended that she continue on 100 twice a day of  flecainide.  We will have her continue her  other current medical therapy.  Currently, there is no obvious  arrhythmia to explain her symptoms.     Champ Mungo. Lovena Le, MD  Electronically Signed    GWT/MedQ  DD: 04/19/2009  DT: 04/20/2009  Job #: [id number]   cc:   [place] Pink

## 2011-04-18 NOTE — Assessment & Plan Note (Signed)
[facility] OFFICE NOTE   NAME:[name] [name], [name] [name]                           MRN:          [id number]  DATE:04/13/2009                            DOB:          25-Apr-1935    PRIMARY CARE PHYSICIAN:  Dr. [place] Pink.   HISTORY OF PRESENT ILLNESS:  [name] [name] is a pleasant 76 year old African  American female with a past medical history significant for  hypertension, diabetes mellitus, atrial fibrillation with rapid  ventricular response, sick sinus syndrome, status post placement of a  pacemaker in January 2008, glaucoma, and gastroesophageal reflux disease  who initially established care in our cardiology office October 2009.  Her main cardiac problem is her sick sinus syndrome with recurrent  atrial fibrillation.  She was seen by Dr. Cristopher Peru in October 2009  and was started on flecainide 50 mg twice daily.  She has also been  managed on a beta-blocker and a calcium channel blocker for her  recurrent atrial fibrillation.  Her pacemaker was interrogated in  October 2009 and showed she was in atrial fibrillation 1% at the time.  She is primarily AV paced.  She has since seen Dr. Jolyn Nap in March  2010 at which time her beta-blocker dose was decreased.  She comes in  today as an add-on to see me with complaints of recurrent palpitations.  She tells me that she has over the last 2 weeks been noticing increasing  episodes of fluttering in her chest that is occasionally associated with  dizziness.  This weekend, she did have some hoarseness on Saturday and  Sunday associated with episodes of palpitations.  She denies any near  syncopal or syncopal events.  She does continue to have some mild  swelling in both her lower extremities that resolves at night when lying  in bed.  She denies any exertional dyspnea or chest pain.  She has been  known in the past with abnormal left ventricular function with an  echocardiogram  in June 2009 showing normal left ventricular systolic  function.  She did have a heart catheterization in 2004 that showed  normal coronary arteries.  She had a stress test in June 2009 that  showed no evidence of myocardial ischemia.  She tells me she has been  taking all of her medications as prescribed.   PAST MEDICAL HISTORY:  1. Sick sinus syndrome with placement of a permanent pacemaker in      January 2008 with most recent interrogation in October 2009 by Dr.      Cristopher Peru.  2. Paroxysmal atrial fibrillation.  3. Hypertension.  4. Diabetes mellitus.  5. Gastroesophageal reflux disease.  6. Glaucoma.   CURRENT MEDICATIONS:  1. Avandia 4 mg once daily.  2. Metformin XR 500 mg 2 tablets once daily.  3. Nexium 40 mg once daily.  4. Toprol-XL 100 mg twice daily.  5. Lipitor 20 mg once daily.  6. Coumadin as directed.  7. Fish oil 1200 mg once daily.  8. Flecainide 50 mg twice daily.  9. Cardizem CD 240  mg once daily.  10.Diovan/hydrochlorothiazide 320/12.5 mg once daily.  11.CPAP at night.   REVIEW OF SYSTEMS:  As stated in the history of present illness and is  otherwise negative.   PHYSICAL EXAMINATION:  VITALS:  Blood pressure 160/80, pulse 73 and  regular, respirations 12 and unlabored, weight 248 pounds.  GENERAL:  She is a pleasant, elderly African American female, in no  acute distress.  She is alert and oriented x3.  SKIN:  Warm and dry.  HEENT:  Normal.  NECK:  No JVD.  No carotid bruits.  No thyromegaly.  No lymphadenopathy.  LUNGS:  Clear to auscultation bilaterally without wheezes, rhonchi, or  crackles noted.  MUSCULOSKELETAL:  Muscle strength is 5/5 in all extremities.  CARDIOVASCULAR:  Regular rate and rhythm without murmurs, gallops, or  rubs noted.  ABDOMEN:  Soft, nontender, nondistended.  Bowel sounds are present.  EXTREMITIES:  There is trace bilateral lower extremity edema.  Pulses  are 2+ in all extremities.   DIAGNOSTIC STUDIES:  A 12-lead  EKG obtained in our office today shows an  AV-paced rhythm.  The QRS duration is 86 milliseconds.  The corrected QT  interval is 445 milliseconds.   ASSESSMENT AND PLAN:  This is a pleasant 76 year old African American  female with a history of sick sinus syndrome, status post placement of  permanent pacemaker who has been managed on flecainide therapy, beta-  blocker therapy, and calcium channel blocker therapy in an attempt to  suppress her episodes of palpitations secondary to atrial fibrillation.  She has over the last several weeks had recurrent episodes of  palpitations which have been bothersome to her.  As stated above, she  has had no chest pain, shortness of breath, near-syncope or syncope.  She does feel mildly dizzy when she has these episodes.  I have  discussed this with Dr. Jolyn Nap.  I will increase the flecainide to  100 mg twice daily.  Her blood pressure is slightly elevated today, but  I will not make any other changes at this time.  We will have the  patient come in for a 12-lead EKG in 1-week.  We will then plan followup  with Dr. Jolyn Nap in 4-6 weeks here in this office.  The patient is  aware that she should call us if she has any change in her clinical  status.     [name], MD  Electronically Signed    CM/MedQ  DD: 04/13/2009  DT: 04/14/2009  Job #: [id number]   cc:   [place] Pink

## 2011-04-18 NOTE — Progress Notes (Signed)
[facility] OFFICE NOTE   NAME:[name], [name]                           MRN:          [id number]  DATE:02/01/2009                            DOB:          1934/12/20    [name] [name] is seen at her own request today because of concerns about  medications and the previously implanted pacemaker.  She has seen  [facility] and Dr. Lovena Le in the past and has a history of tachybrady  syndrome and had a pacemaker implanted some time ago, though although I  do not know by home.  At that time, she was followed by [facility].  She is now followed by [place].  She had been started on flecainide 50 b.i.d.  for paroxysmal atrial fibrillation, and she is tolerating this quite  well.  She notes that she recently, following the VATS procedure earlier  in the month, which diagnosed only chronic scar tissue, had the onset of  diarrhea and vomiting.  In the acute context of this over the last  couple of days, she had felt episodes of tachy palpitations at night.   Her other issue was whether her medications could be reduced.  She does  note that her Cardizem was switched to diltiazem was a generic.   OTHER MEDICATIONS:  1. Atacand HCT 32/12.5.  2. Avandia 4.  3. Metformin 1000.  4. Nexium 40.  5. Metoprolol 100 b.i.d.  6. Lipitor 20.  7. Xalatan for her glaucoma.  8. Coumadin.  9. Flecainide as noted.   On examination, her blood pressure today was quite good at 121/69, her  pulse was 73, her weight was 239 which is down 12 pounds in the last 3  months.  Her lungs were clear.  Heart sounds were regular.  The abdomen  was protuberant.  The extremities had no significant edema.   Interrogation of her Medtronic pacemaker demonstrated a P-wave of 1.9  with impedance of 360, a threshold of 1 volt in both the chambers, the R-  wave was 6.6 with impedance of 528.  The battery voltage 2.99 and  interestingly, there were no episodes  of atrial fibrillation.   IMPRESSION:  1. Tachybrady syndrome.  2. Diabetes.  3. Hypertension.  4. Coumadin for the above.  5. Status post pacemaker.  6. Intercurrent problems with,      a.     Video-assisted thoracoscopic surgery for chronic scar.      b.     Probable gastrointestinal bug.   [name] [name] is doing really pretty well at this point.  Her blood  pressures have been reasonably well controlled with last couple of  visits, and so I have taken the liberty of decreasing metoprolol from  100 mg twice daily to 50 mg twice daily.  We will continue on her  current medications.  She asked about her sucralfate and her Nexium.  I  have suggested she follow up with Dr. Chrystine Oiler about that.   We will see her again in 6 months' time.     Deboraha Sprang, MD, [facility]  Electronically Signed    SCK/MedQ  DD: 02/01/2009  DT: 02/02/2009  Job #: [id number]   cc:   [place] Pink

## 2011-04-18 NOTE — Progress Notes (Signed)
[facility] OFFICE NOTE   NAME:[name] [name], [name] [name]                           MRN:          [id number]  DATE:09/21/2008                            DOB:          January 14, 1935    [name] [name] is referred today for ongoing pacemaker followup as well as  symptomatic palpitations and documented atrial ectopy.  The patient is a  very pleasant 76 year old woman who has a longstanding history of  paroxysmal AFib and tachy-brady syndrome.  She underwent permanent  pacemaker insertion performed by Dr. Tami Ribas back in January 2008.  The  patient has been treated with beta-blockers and calcium channel  blockers, as well as Coumadin.  Unfortunately, she continues to have  symptomatic palpitations, although for the most part her AFib has been  well controlled.  Histograms demonstrated she is in atrial fibrillation  less than 1% of the time.  Her main complaint today those that of  ongoing symptomatic palpitations.  These are fairly disabling to her and  they are associated with fatigue and malaise.  She is particularly  bothered by them.   PAST MEDICAL HISTORY:  Notable for diabetes, hypertension, and  dyslipidemia.  She has no evidence of coronary disease by stress  perfusion imaging as well as no evidence of vascular disease.  The  patient's past medical history is also notable for glaucoma and  gastroesophageal reflux disease.  She has a history of hysterectomy in  the past and a removal of cyst in her right leg.  She had a pacemaker as  previously noted.   MEDICATIONS:  1. Fish oil.  2. Hematinic vitamin daily.  3. Cardizem 240 a day.  4. Coumadin as directed.  5. Lipitor 20 a day.  6. Metoprolol ER 100 twice a day.  7. Nexium 40 a day.  8. Metformin XR 500 two tablets daily.  9. Avandia 4 a day.  10.Atacand 32/12.5 daily.   SOCIAL HISTORY:  The patient denies tobacco or ethanol use.  She is a  widow.  She has two  children.  She is retired.   FAMILY HISTORY:  Notable for her mother with cancer and her father died  in his 10s of heart disease.  She has one son with diabetes.   REVIEW OF SYSTEMS:  Negative except as noted in the HPI.   PHYSICAL EXAMINATION:  GENERAL:  She is a pleasant very well-appearing,  but moderately obese woman in no acute distress.  VITAL SIGNS:  Blood pressure today was 149/81, the pulse 74 and regular,  the respirations were 18, weight was 252 pounds. HEENT:  Normocephalic  and atraumatic.  Pupils are equal and round.  Oropharynx moist.  Sclerae  anicteric.  NECK:  No jugular venous distention.  There are no thyromegaly.  Trachea  is midline.  The carotids are 2+ and symmetric.  LUNGS:  Clear bilaterally to auscultation.  No wheezes, rales, or  rhonchi are present.  There was no increased work of breathing.  There  are no wheezes or rhonchi present.  CARDIOVASCULAR:  Regular rate and rhythm.  Normal S1 and S2.  There  are  no murmurs, rubs, or gallops appreciated.  PMI was not enlarged nor was  it laterally displaced.  ABDOMEN:  Obese, nontender, nondistended.  There was no organomegaly.  The bowel sounds are present.  There are no rebound or guarding.  EXTREMITIES:  No cyanosis, clubbing, or edema.  The pulses are 2+ and  symmetric.  NEUROLOGIC:  Alert and oriented x3.  Cranial nerves II through XII  intact.  Strength was 5/5 and symmetric.  SKIN:  Normal.   Interrogation of her pacemaker demonstrates a Medtronic EnRhythm.  P and  R waves were 2 and 6 respectively.  The impedance 400 in the A, 696 in  the V, threshold of volt 0.4 both in the right atrium and right  ventricle.  The battery voltage was 2.99 volts.  She was in atrial  fibrillation less than 1% of the time.  She was 97% A pacing.  Today we  turned her atrial sensitivity down to 0.9 mV.   IMPRESSION:  1. Symptomatic palpitations.  2. Paroxysmal atrial fibrillation.  3. Hypertension.  4.  Dyslipidemia.   DISCUSSION:  I have discussed the treatment options with [name] [name] in  detail.  As she is quite symptomatic from her atrial fibrillation, I  have recommended a trial of flecainide therapy for the patient.  This  will be initiated and up titrated as needed.  I will see her back in the  office in 1 month.  Initial dose of flecainide will be 50 twice a day.  We will plan on a regular exercise test to rule out arrhythmia once we  settle on a final dose of flecainide based on her symptoms.     Champ Mungo. Lovena Le, MD  Electronically Signed    GWT/MedQ  DD: 09/21/2008  DT: 09/22/2008  Job #: [id number]   cc:   [place] Pink

## 2011-04-18 NOTE — Letter (Signed)
May 31, 2009    Maryland Pink  [address]  [place], [place] [address]   RE:  [name], [name]  MRN:  [id number]  /  DOB:  09-05-1935   Dear Dr. Kary Kos:   [name] [name] is seen in followup for her pacemaker.  She continues to have  complaints of palpitations.  Attempts were made to control her symptoms  with flecainide which had initially significant improvement.  She was on  50 mg twice daily.  Because of recurrent palpitations, I wanted my  partner to increase the flecainide to 100 twice daily.  She did not do  this until just last week as her symptoms going through their typical  waxing and waning pattern initially improved.  However, last week, they  worsened again.  She increased the flecainide to 100 mg twice daily and  this was associated with significant increase in her baseline tinnitus.  She has gone back down on her drug without full resolution of the  increasing tinnitus.   We had a lengthy discussion also regarding whether the pacemaker is a  good thing or a bad thing for her.  (See below).  She thinks that it is  the problem.   Review of her medications include:  1. Metoprolol 100 twice daily.  2. Atacand 30-12.5.  3. Metformin.  4. Lipitor.  5. Diltiazem 240.  6. Warfarin.  7. Flecainide 50.   PHYSICAL EXAMINATION:  GENERAL:  She is in no acute distress.  VITAL SIGNS:  Her blood pressure is 132/72, pulse is 72, weight of 252.  LUNGS:  Were clear.  HEART:  Sounds were regular.  ABDOMEN:  Soft.  EXTREMITIES:  No edema.  She was in no acute distress.   Interrogation of her pacemaker demonstrated that she had only 5 episodes  of atrial fibrillation all of which lasted less than a minute since  having been seen in May.  Her PVC frequency is down from 3 per hour to  1.5 per hour.  She is 99% atrial paced at her lower rate limit of 70.   She is device dependent in the atrium to rate of 30.   IMPRESSION:  1. Sinus node dysfunction.  2. Paroxysmal atrial  fibrillation.  3. Palpitations question related to the above or premature ventricular      contractions.  4. Status post pacer for sinus node dysfunction.  5. Flecainide therapy for the above.  6. Tinnitus, question related to flecainide therapy for the above.  7. Coumadin therapy.  8. Morbid obesity.   Dr. Kary Kos, [name] [name] has a number of palpitation complaints.  It is  not clear to me whether these represent PVCs or atrial fibrillation.  I  suspect it is the former.  We will plan to have her first decrease her  flecainide back down at 50 and see how her tinnitus does.  In the event  that she continues to have problematic tinnitus, I would recommend, I  would try propafenone as an alternative.   If and when her symptoms recur with the palpitations, I would suggest at  that point that we obtain a BMET and a magnesium to see if there is any  electrolyte disturbance that might be contributing.  We also need to  know how her baseline TSH is.  I would also utilize an event recorder  (not MCOT) to try to clarify the mechanism of her palpitations, so as  know how best to direct therapy.   Thanks for  allowing [place] to participate in her care.    Sincerely,      Deboraha Sprang, MD, [facility]  Electronically Signed   SCK/MedQ  DD: 05/31/2009  DT: 06/01/2009  Job #: [id number]

## 2011-04-18 NOTE — Progress Notes (Signed)
[facility] OFFICE NOTE   NAME:[name], [name]                           MRN:          [id number]  DATE:10/19/2008                            DOB:          1935/06/30    [name] [name] returns today for followup.  She is a very pleasant woman with  a history of symptomatic tachy-brady syndrome and paroxysmal AFib who is  status post pacemaker insertion back in January 2008.  When I saw her  several months ago, the patient complains of significant palpitations  and fatigue.  She has recently been started on flecainide 50 mg twice a  day and notes her palpitations are improved and she overall has more  energy and feels better.  She does still have dyspnea on exertion which  has been chronic.   CURRENT MEDICINES:  1. Atacand 32/12.5 a day.  2. Avandia 4 a day.  3. Metformin XR 500 two tablets daily.  4. Nexium 40 a day.  5. Metoprolol ER 100 mg twice daily.  6. Lipitor 20 a day.  7. Coumadin as directed.  8. Xalatan eye drops.  9. Cardizem LA 240 a day.  10.Flecainide 50 twice a day.   PHYSICAL EXAMINATION:  GENERAL:  She is a pleasant, obese woman in no  distress.  VITAL SIGNS:  Blood pressure was 134/84, the pulse 69 and regular,  respirations were 18.  Weight was 252 pounds.  NECK:  No jugular venous distention.  LUNGS:  Clear bilaterally to auscultation.  No wheezes, rales, or  rhonchi are present.  CARDIOVASCULAR:  Regular rate and rhythm.  Normal S1 and S2.  ABDOMEN:  Obese, nontender, nondistended.  EXTREMITIES:  No edema.   Interrogation of her pacemaker today demonstrates a Medtronic EnRhythm.  P-waves were 1.5, the R-waves were 8.  The impedance 424 in the A, 608  in the V.  The threshold testing was normal.  Battery voltage was 2.99  volts.  She was 99% A-paced, 72% V-paced despite maximizing her AV  delay.   IMPRESSION:  1. Symptomatic bradycardia status post pacemaker.  2. Paroxysmal  atrial fibrillation with palpitations.  3. Flecainide therapy secondary to paroxysmal atrial fibrillation with      palpitations.  4. Hypertension.  5. Obesity.  6. Diabetes.   DISCUSSION:  [name] [name] today is improved on flecainide.  She is,  however, not particularly active and I note that she is pacing basically  all the time.  For this reason and because she is not able walk very  well, I have recommended not having her proceed with a treadmill test,  but I have actually recommended slow regular walking as much as she can.  She agrees to this and she will try to lose some weight for [place].  I will  see her back in several months.     Champ Mungo. Lovena Le, MD  Electronically Signed    GWT/MedQ  DD: 10/19/2008  DT: 10/20/2008  Job #: [id number]   cc:   [place] Pink, MD

## 2011-04-18 NOTE — Assessment & Plan Note (Signed)
[facility] OFFICE NOTE   NAME:[name], [name]                           MRN:          [id number]  DATE:09/09/2008                            DOB:          [birth year]    PRIMARY CARE PHYSICIAN:  Dr. [place] Pink.   HISTORY OF PRESENT ILLNESS:  [name] is a pleasant 76 year old African  American female with a past medical history significant for  hypertension, diabetes mellitus, atrial fibrillation with rapid  ventricular response, sick sinus syndrome status post placement of  pacemaker in January 2008, glaucoma, and gastroesophageal reflux disease  who has referred herself to our office today to establish cardiology  care.  The patient has been followed by [facility] for the  last 2 years and has been seen by Dr. Rockey Situ.  She wishes to switch her  care to our office today and presents for her initial visit.  She tells  me that she had had problems with atrial fibrillation and a rapid  ventricular response in the past.  The episodes of tachycardia with  palpitations were severely limiting her lifestyle.  She had an AV pacer  placed in January 2008.  Since that time, she has had periods of being  asymptomatic.  However, it seems that an awareness of palpitations has  continued to bother her over the last year.  She was most recently seen  in the office of Dr. Rockey Situ on August 25, 2008.  He has attempted to  control her palpitations by increasing her Toprol.  She tells me today  that the changes in medications have made no difference in her awareness  of her palpitations.  She continues to have multiple episodes daily,  where she feels her heart racing and skipping beats.  There is an  associated fullness in her neck and throat.  This occurs occasionally  when she is around the house and also when she is performing exertional  activities.  This tends to make her feel weak and slightly dizzy.  She  seems frustrated today that her palpitations continue to occur on  multiple medications and even after her pacemaker placement.  She denies  any exertional chest pain or shortness of breath.  She also denies any  nausea, vomiting, diaphoresis, orthopnea, PND, or change in her baseline  trace lower extremity edema.   After reviewing the records from Dr. Donivan Scull office, it appears that  she had her pacemaker evaluated on August 25, 2008.  She has a  Medtronic Alpine, model [id number], serial [id number].  She is A paced, V  sensed 73% of the time.  A paced , V paced 21% of the time.  A sensed V  sensed 4.6% of the time.  Her underlying rhythm V-paced.  Her voltage is  2.99.  Atrial impedance is 480 ohms with RV impedance of 560 ohms.  She  is sensing the P-waves at 3.0 and R-wave at 6.5.  Thresholds in the  atrium are 0.5 ohms at 0.4 milliseconds, thresholds in the RV is 1.0  ohms at 0.4 milliseconds.  There were 24 atrial heart rate episodes, all  less than 1 minute long.  No changes were made.   PAST MEDICAL HISTORY:  1. Sick sinus syndrome with placement of a permanent pacemaker in      January 2008.  2. Hypertension.  3. Diabetes mellitus.  4. History of atrial fibrillation with rapid ventricular response.  5. Gastroesophageal reflux disease.  6. Glaucoma.   PAST SURGICAL HISTORY:  1. Hysterectomy.  2. Permanent pacemaker placement.  3. Removal of a cyst on the right leg.   ALLERGIES:  No known drug allergies.   CURRENT MEDICATIONS:  1. Atacand 32/12.5 mg once daily.  2. Avandia 4 mg once daily.  3. Metformin XR 500 mg 2 tablets once daily.  4. Nexium 40 mg once daily.  5. Toprol-XL 100 mg twice daily.  6. Lipitor 20 mg once daily.  7. Xalatan eye drops at night.  8. Coumadin as directed.  9. Cardizem LA 240 mg once daily.  10.Hematinic vitamin once daily.  11.Fish oil 1200 mg once daily.   SOCIAL HISTORY:  The patient denies use of tobacco, alcohol, or illicit   drugs.  She is widowed and has 2 children.  She retired in September  2002.   FAMILY HISTORY:  The patient's mother died from pancreatic cancer, and  her father died in his 36s from when she was told was a cardiac arrest.  However, he had no known heart problems.  She has 2 sons.  One of her  son has diabetes mellitus and the other son has hypertension.   REVIEW OF SYSTEMS:  As stated in the history of present illness, and is  otherwise negative.   PHYSICAL EXAMINATION:  GENERAL:  She is a very pleasant obese middle-  aged [place] American female in no acute distress.  VITAL SIGNS:  Blood pressure 146/86, pulse 71 and regular, respirations  12 and nonlabored.  NECK:  No JVD.  No carotid bruits.  No thyromegaly.  No lymphadenopathy.  SKIN:  Warm and dry.  HEENT:  Oropharynx clear.  Mucous membranes are moist.  LUNGS:  Clear to auscultation bilaterally without wheezes, rhonchi, or  crackles noted.  CARDIOVASCULAR:  Regular rate and rhythm without murmurs, gallops, or  rubs noted.  ABDOMEN:  Soft, nontender, and obese.  Bowel sounds are present.  EXTREMITIES:  There is trace bilateral lower extremity edema.  Pulses  are 2+ in all extremities.  NEURO:  Nonfocal.  Cranial nerves II through XII are grossly intact.   DIAGNOSTIC STUDIES:  1. A 12-lead electrocardiogram obtained in our office today shows an      electronic atrial pacemaker with a ventricular rate of 71 beats per      minute.  There are no ventricular-pacing spikes.  This suggest an      atrial pacemaker with ventricular sensing.  2. Left heart catheterization in November 2004, with normal coronary      arteries and normal systolic function of the left ventricle.  3. Stress Myoview in June 2009, without any EKG evidence of ischemia      or perfusion evidence of ischemia.  4. Echocardiogram in June 2009, with ejection fraction of 55% and mild      pulmonary hypertension but no significant valvular abnormalities.  5. Normal  arterial lower extremity Doppler studies in 2008.   ASSESSMENT/PLAN:  This is a pleasant 76 year old African American female  with a history of atrial fibrillation with rapid ventricular response on  chronic Coumadin therapy as  well as sick sinus syndrome with placement  of a permanent pacemaker who presents today to establish cardiology care  with [facility].  The patient continues to complain of daily  palpitations, which are significantly limiting her lifestyle.  She has  been seen in the past by Dr. Rockey Situ of [facility].  Attempts have been made to adjust her beta blockade and her calcium  channel blockers to control her palpitations.  She has had no relief in  her palpitations and feel that they actually may be more frequent and  worsened over the last several months.  I have reviewed her medications  today and feel they are all appropriate.  I would like to continue her  medications as they are currently written including her Coumadin.  I  would like to have her establish care with one of our  electrophysiologists in the practice.  I think that she may need to have  a full evaluation of her pacemaker here in our office.  We can review  what her underlying rhythm is as well as pick up on any arrhythmias.  I  have arranged for her to establish care with Dr. Crissie Sickles here in our  office in [place] on September 21, 2008, at 10:30 a.m..  The patient  understands that she will establish care with Dr. Lovena Le and will follow  with him here in our office.  I told her that I would love to follow her  as a patient, however, her cardiac problems are primarily due to her  atrial fibrillation, and her sick sinus syndrome so I feel it would be  most beneficial for her to follow with Dr. Lovena Le.  I have alerted her  to call our office, if she has any problems prior to her appointment  with Dr. Lovena Le.     [name], MD  Electronically Signed    CM/MedQ   DD: 09/09/2008  DT: 09/10/2008  Job #: [id number]   cc:   [place] Pink

## 2011-04-21 NOTE — Assessment & Plan Note (Signed)
[facility]   NAME:  [name] [name], [name] [name]                  ACCOUNT NO.:  [id number]   MEDICAL RECORD NO.:  [id number]      DATE OF BIRTH:  [birth year]   PHYSICIAN:  Ricard Dillon, M.D. VISIT DATE:  02/15/2007                                   OFFICE VISIT   HISTORY:  [name] [name] is a 76 year old woman who was referred here for a  consideration of a refractory and recurrent wound on the right leg.  The  patient tells me that in January she had recently been placed on  Coumadin after having a pacemaker implant.  She tells me that very  shortly thereafter she developed a raised, tender swelling on the medial  aspect of her right leg.  This soon developed surrounding erythema and  pain.  In reviewing her doctor's notes it appears that her INR was 5 at  that time.  Her Coumadin was held and her dose reduced.  She was also  started on Keflex, advised to keep the leg elevated.  With this  treatment, she apparently did very well with improvement in the area,  less pain and discomfort.  She tells me further that she had a boil  develop in the exact same area in 1999.  This apparently required  debridement and compression wraps.  This area ultimately healed,  although she did have some scarring.  She assures me that the skin in  her leg prior to the Coumadin and subsequent infection looked normal  with only some scarring from the previous surgery in 1999.   PAST MEDICAL HISTORY:  1. Type 2 diabetes mellitus.  2. Hypertension.  3. Hyperlipidemia.  4. Gastroesophageal reflux disease.  5. Glaucoma.  6. History of gout.   CURRENT MEDICATIONS:  Include:  1. Coumadin.  2. Bayer aspirin.  3. Cardizem LA 180 q.d.Marland Kitchen  4. Nexium 40 q.d..  5. Atacand/hydrochlorothiazide 32/12.5 q.d..  6. Toprol XL 100 q.d.Marland Kitchen  7. Lipitor 20 q.d..  8. Avandia 4 q.d..  9. Glucophage XR 500 q.d.Marland Kitchen  10.Pentasa 250 two tablets 3 times a day.  11.Xalatan opthalmic drops.   PHYSICAL EXAMINATION:  GENERAL:   Her temperature was 97.8, pulse 73,  respirations 18, blood pressure 143/63.  RESPIRATORY:  Clear air entry bilaterally.  CARDIAC:  Heart sounds are normal.  There was no murmurs, no increase in  jugular venous pressure and no signs of congestive heart failure.  EXTREMITIES:  Her peripheral pulses are palpable and feel normal  actually on the right leg, perhaps slightly reduced on the left.  I note  that she had Doppler's done at her cardiologist's office.  I do not have  these results.  SKIN:  On the medial aspect of the right anterior leg there is a  discolored area measuring 6.5 x 4 cm.  There is no open wound here.  There is no tenderness.  The area underneath feels slightly firm.  Underneath this area is a scar, presumably from the surgery she  describes in 1999.  There was no reason to suspect that this is a  primary diabetic skin related condition.  The patient assures me that  prior to January her skin looked normal in this area, therefore, I do  not think  there is a more ominous skin condition here.   IMPRESSION:  1. Recurrent wound on her right anterior leg, which is largely heeled      and I think secondary to a hematoma related to a high INR and then      subsequent cellulitis.  All of this appears to have resolved with      therapy already initiated by her physicians.  She wears the      equivalent of TED stockings, I think this should continue to      protect the area.  I have advised her that if the discoloration      expands that she may need to seek further medical advise, although      I think this is highly unlikely.  I think discoloration simply      represents a resolving hematoma.  The patient seems to feel the      whole area is getting better.   I did not think she needed to follow here with [place] as there was no open  wound.  She probably has mild venous stasis; however, I think with her  own stockings this is under adequate control.  There was no evidence she   had significant arterial insufficiency.           ______________________________  Ricard Dillon, M.D.     MGR/MEDQ  D:  02/15/2007  T:  02/16/2007  Job:  [id number]   cc:   Jonell Cluck, ENT-C

## 2011-05-25 ENCOUNTER — Encounter: Payer: Self-pay | Admitting: Cardiovascular Disease

## 2011-07-10 ENCOUNTER — Encounter: Payer: Self-pay | Admitting: Cardiovascular Disease

## 2011-07-10 ENCOUNTER — Ambulatory Visit (INDEPENDENT_AMBULATORY_CARE_PROVIDER_SITE_OTHER): Payer: Medicare Other | Admitting: Cardiovascular Disease

## 2011-07-10 DIAGNOSIS — R0602 Shortness of breath: Secondary | ICD-10-CM

## 2011-07-10 DIAGNOSIS — I4891 Unspecified atrial fibrillation: Secondary | ICD-10-CM

## 2011-07-10 DIAGNOSIS — I10 Essential (primary) hypertension: Secondary | ICD-10-CM

## 2011-07-10 DIAGNOSIS — I4949 Other premature depolarization: Secondary | ICD-10-CM

## 2011-07-10 DIAGNOSIS — I495 Sick sinus syndrome: Secondary | ICD-10-CM

## 2011-07-10 DIAGNOSIS — E119 Type 2 diabetes mellitus without complications: Secondary | ICD-10-CM

## 2011-07-10 DIAGNOSIS — R609 Edema, unspecified: Secondary | ICD-10-CM

## 2011-07-10 NOTE — Assessment & Plan Note (Signed)
Rare episodes of palpitations at nighttime when she turns over otherwise no tachycardia palpitations. No medication changes made

## 2011-07-10 NOTE — Assessment & Plan Note (Signed)
Blood pressure is well controlled on today's visit. No changes made to the medications. 

## 2011-07-10 NOTE — Progress Notes (Signed)
Patient ID: [name] [name], female    DOB: [birth year], 76 y.o.   MRN: [id number]  HPI Comments: [name] [name] is seen in followup for a history of atrial fibrillation, symptomatic palpitations, PVCs, status post pacemaker implantation for sick sinus syndrome/tachybradycardia syndrome with chronic symptoms of palpitations, shortness of breath with exertion, who presents for routine followup.   Earlier in the year, she had significant symptoms of shortness of breath and palpitations. In May, she had adjustments to her pacemaker and since that time has felt back to normal.  Recently, she reports that she has hot flashes, occasionally has a racing heart rate when she is in bed. She is able to exercise without any significant symptoms though she does have a warm up when she goes to the [facility]. When she walks in the mall, she does get lightheadedness with exertion. Her weight has been climbing despite her exercise.   her cardiac workup in the past has included  cardiac catheterization in 2004, stress testing,     echo  showed normal systolic function, mild LVH, diastolic relaxation abnormality, mildly elevated right ventricular systolic pressures, mild MR.   She had a treadmill study showing normal blood pressure response to exercise, chronotropic incompetence with peak heart rate of 100 beats per minute, resting heart rate in the 70s.   Previous  PFTs  demonstrated extrinsic compression/restrictive physiology.   EKG shows Paced rhythm,  rate 61 beats per minute, Paced rhythm, no significant ST or T wave changes       Outpatient Encounter Prescriptions as of 07/10/2011  Medication Sig Dispense Refill  . ALPRAZolam (XANAX) 0.5 MG tablet Take 0.5 mg by mouth. 1/2-1 prn       . B Complex-C-Min-Fe-FA (HEMATINIC PLUS VIT/MINERALS PO) Take by mouth daily.        Marland Kitchen diltiazem (CARDIZEM CD) 240 MG 24 hr capsule TAKE ONE CAPSULE BY MOUTH DAILY  30 capsule  6  . esomeprazole (NEXIUM) 40 MG capsule Take 40 mg by  mouth 2 (two) times daily.        . furosemide (LASIX) 20 MG tablet Take 20 mg by mouth daily. As needed        . LIPITOR 20 MG tablet TAKE ONE TABLET BY MOUTH DAILY.  30 tablet  5  . metFORMIN (GLUMETZA) 500 MG (MOD) 24 hr tablet Take 500 mg by mouth 2 (two) times daily.        . mometasone (ELOCON) 0.1 % cream Apply topically daily. Apply to ear as directed two times a day for 10 days as needed         . mometasone (ELOCON) 0.1 % lotion Apply topically daily. Place 3 drops into itching ear two times a day for 10 days as needed       . mometasone (NASONEX) 50 MCG/ACT nasal spray Place 2 sprays into the nose daily as needed.        . Nebivolol HCl (BYSTOLIC) 20 MG TABS Take by mouth daily.        . NON FORMULARY C-PAP (at bedtime)       . olmesartan-hydrochlorothiazide (BENICAR HCT) 40-12.5 MG per tablet Take 1 tablet by mouth daily.        . Omega-3 Fatty Acids (FISH OIL) 1000 MG CAPS Take by mouth daily.        . sitaGLIPtan (JANUVIA) 100 MG tablet Take 100 mg by mouth daily.        . travoprost, benzalkonium, (TRAVATAN) 0.004 % ophthalmic  solution 1 drop as directed.        . warfarin (COUMADIN) 4 MG tablet Take 4 mg by mouth daily. As directed by coumadin clinic       . febuxostat (ULORIC) 40 MG tablet Take 80 mg by mouth daily.           Review of Systems  Constitutional: Negative.   HENT: Negative.   Eyes: Negative.   Respiratory: Positive for shortness of breath.   Cardiovascular: Positive for palpitations.  Gastrointestinal: Negative.   Musculoskeletal: Negative.   Skin: Negative.   Neurological: Positive for dizziness.  Hematological: Negative.   Psychiatric/Behavioral: Negative.   All other systems reviewed and are negative.    BP 146/85  Pulse 61  Wt 256 lb (116.121 kg)  Physical Exam  Nursing note and vitals reviewed. Constitutional: She is oriented to person, place, and time. She appears well-developed and well-nourished.       obese  HENT:  Head:  Normocephalic.  Nose: Nose normal.  Mouth/Throat: Oropharynx is clear and moist.  Eyes: Conjunctivae are normal. Pupils are equal, round, and reactive to light.  Neck: Normal range of motion. Neck supple. No JVD present.  Cardiovascular: Normal rate, regular rhythm, S1 normal, S2 normal, normal heart sounds and intact distal pulses.  Exam reveals no gallop and no friction rub.   No murmur heard. Pulmonary/Chest: Effort normal and breath sounds normal. No respiratory distress. She has no wheezes. She has no rales. She exhibits no tenderness.  Abdominal: Soft. Bowel sounds are normal. She exhibits no distension. There is no tenderness.  Musculoskeletal: Normal range of motion. She exhibits no edema and no tenderness.  Lymphadenopathy:    She has no cervical adenopathy.  Neurological: She is alert and oriented to person, place, and time. Coordination normal.  Skin: Skin is warm and dry. No rash noted. No erythema.  Psychiatric: She has a normal mood and affect. Her behavior is normal. Judgment and thought content normal.         Assessment and Plan

## 2011-07-10 NOTE — Assessment & Plan Note (Signed)
Currently with no significant edema. No changes made to her medications

## 2011-07-10 NOTE — Patient Instructions (Signed)
You are doing well. No medication changes were made. Please call us if you have new issues that need to be addressed before your next appt.  We will call you for a follow up Appt. In 6 months  

## 2011-07-10 NOTE — Assessment & Plan Note (Signed)
For the most part, she appears to be doing well on her current medication regimen. She does have symptoms of dizziness, occasional shortness of breath and fatigue. These are very nonspecific.

## 2011-07-10 NOTE — Assessment & Plan Note (Signed)
We have encouraged continued exercise, careful diet management in an effort to lose weight. 

## 2011-07-10 NOTE — Assessment & Plan Note (Signed)
She has periodic shortness of breath particularly when she walks in the mall. She is able to exercise with no symptoms of shortness of breath. Uncertain if she has chronotropic incompetence secondary to underlying sick sinus syndrome and being on rate controlling agents. No changes made at this time as she is asymptomatic with exercise. Unable to exclude the heat or weight gain as a cause of her symptoms.

## 2011-07-19 ENCOUNTER — Encounter: Payer: Medicare Other | Admitting: Internal Medicine

## 2011-07-24 ENCOUNTER — Emergency Department: Payer: Self-pay | Admitting: *Deleted

## 2011-08-01 ENCOUNTER — Encounter: Payer: Self-pay | Admitting: Internal Medicine

## 2011-08-02 ENCOUNTER — Encounter: Payer: Self-pay | Admitting: Internal Medicine

## 2011-08-02 ENCOUNTER — Ambulatory Visit (INDEPENDENT_AMBULATORY_CARE_PROVIDER_SITE_OTHER): Payer: Medicare Other | Admitting: Internal Medicine

## 2011-08-02 DIAGNOSIS — I10 Essential (primary) hypertension: Secondary | ICD-10-CM

## 2011-08-02 DIAGNOSIS — Z950 Presence of cardiac pacemaker: Secondary | ICD-10-CM

## 2011-08-02 DIAGNOSIS — I4949 Other premature depolarization: Secondary | ICD-10-CM

## 2011-08-02 DIAGNOSIS — R609 Edema, unspecified: Secondary | ICD-10-CM

## 2011-08-02 DIAGNOSIS — I495 Sick sinus syndrome: Secondary | ICD-10-CM

## 2011-08-02 LAB — PACEMAKER DEVICE OBSERVATION
AL AMPLITUDE: 1.4136 mv
AL IMPEDENCE PM: 536 Ohm
AL THRESHOLD: 0.5 V
ATRIAL PACING PM: 93.92
BAMS-0001: 135 {beats}/min
BATTERY VOLTAGE: 2.99 V
RV LEAD AMPLITUDE: 7.1635 mv
RV LEAD IMPEDENCE PM: 664 Ohm
RV LEAD THRESHOLD: 1 V
VENTRICULAR PACING PM: 1.1

## 2011-08-02 NOTE — Patient Instructions (Signed)
Follow up with Nevin Bloodgood in 6 months.

## 2011-08-02 NOTE — Assessment & Plan Note (Signed)
Stable with 93% atrial pacxing

## 2011-08-02 NOTE — Assessment & Plan Note (Signed)
Re cently in ER for poorly controlled BP  Have suggested that she can take increased diltiazem if this recurs

## 2011-08-02 NOTE — Assessment & Plan Note (Signed)
Treated with diuretics and  Compression stockings.  The other question is whether her CCB is contributing-- will follow as relatively stable

## 2011-08-02 NOTE — Progress Notes (Signed)
HPI  [name] [name] is a 76 y.o. female  Seen in followup for pacemaker implantation for sinus node dysfunction chronotropic incompetence. SHe also has a history of atrial fibrillation and PVCs.At her last office visits, multiple reprogrammings were done which have been associ w marked improvement  Her good days outweigh her bad ones  The patient denies  chest pain edema or palpitations.  There has been no syncope or presyncope.    Past Medical History  Diagnosis Date  . Sinoatrial node dysfunction   . Atrial fibrillation   . Unspecified essential hypertension   . Cardiac pacemaker in situ   . Esophageal reflux   . Unspecified glaucoma   . Type II or unspecified type diabetes mellitus without mention of complication, not stated as uncontrolled   . Unspecified glaucoma   . Type II or unspecified type diabetes mellitus without mention of complication, not stated as uncontrolled   . Obstructive sleep apnea   . Obese   . Palpitations     Past Surgical History  Procedure Date  . Hysterectomy (other) 1970  . Cyst on right leg 03/1998  . Pacemaker insertion 2008    Current Outpatient Prescriptions  Medication Sig Dispense Refill  . ALPRAZolam (XANAX) 0.5 MG tablet Take 0.5 mg by mouth. 1/2-1 prn       . B Complex-C-Min-Fe-FA (HEMATINIC PLUS VIT/MINERALS PO) Take by mouth daily.        Marland Kitchen diltiazem (CARDIZEM CD) 240 MG 24 hr capsule TAKE ONE CAPSULE BY MOUTH DAILY  30 capsule  6  . esomeprazole (NEXIUM) 40 MG capsule Take 40 mg by mouth 2 (two) times daily.        . febuxostat (ULORIC) 40 MG tablet Take 40 mg by mouth daily.       . furosemide (LASIX) 20 MG tablet Take 20 mg by mouth daily. As needed        . LIPITOR 20 MG tablet TAKE ONE TABLET BY MOUTH DAILY.  30 tablet  5  . metFORMIN (GLUMETZA) 500 MG (MOD) 24 hr tablet Take 500 mg by mouth 2 (two) times daily.        . mometasone (ELOCON) 0.1 % cream Apply topically daily. Apply to ear as directed two times a day for 10 days as  needed         . mometasone (ELOCON) 0.1 % lotion Apply topically daily. Place 3 drops into itching ear two times a day for 10 days as needed       . mometasone (NASONEX) 50 MCG/ACT nasal spray Place 2 sprays into the nose daily as needed.        . Nebivolol HCl (BYSTOLIC) 20 MG TABS Take by mouth daily.        . NON FORMULARY C-PAP (at bedtime)       . olmesartan-hydrochlorothiazide (BENICAR HCT) 40-25 MG per tablet Take 1 tablet by mouth daily.        . Omega-3 Fatty Acids (FISH OIL) 1000 MG CAPS Take by mouth daily.        . sitaGLIPtan (JANUVIA) 100 MG tablet Take 100 mg by mouth daily.        . travoprost, benzalkonium, (TRAVATAN) 0.004 % ophthalmic solution 1 drop as directed.        . warfarin (COUMADIN) 4 MG tablet Take 4 mg by mouth daily. As directed by coumadin clinic         No Known Allergies  Review of Systems negative except from  HPI and PMH  Physical Exam Well developed and well nourished in no acute distress HENT normal E scleral and icterus clear Neck Supple JVP flat; carotids brisk and full Clear to ausculation Regular rate and rhythm, no murmurs gallops or rub Soft with active bowel sounds No clubbing cyanosis and edema Alert and oriented, grossly normal motor and sensory function Skin Warm and Dry   Assessment and  Plan

## 2011-08-02 NOTE — Assessment & Plan Note (Signed)
PVC freq markedly diminished now at about 10/hr

## 2011-08-02 NOTE — Assessment & Plan Note (Signed)
The patient's device was interrogated.  The information was reviewed. No changes were made in the programming.    

## 2011-08-30 ENCOUNTER — Other Ambulatory Visit: Payer: Self-pay | Admitting: Internal Medicine

## 2011-09-27 ENCOUNTER — Telehealth: Payer: Self-pay

## 2011-09-27 MED ORDER — NEBIVOLOL HCL 20 MG PO TABS: 20.0000 mg | ORAL_TABLET | Freq: Every day | ORAL | Status: DC

## 2011-09-27 NOTE — Telephone Encounter (Signed)
Refill sent for bystolic 20 mg take one tablet daily.

## 2011-10-24 ENCOUNTER — Ambulatory Visit: Payer: Self-pay | Admitting: Family Medicine

## 2011-10-31 ENCOUNTER — Telehealth: Payer: Self-pay | Admitting: *Deleted

## 2011-10-31 NOTE — Telephone Encounter (Signed)
Pt was at gym recently on bike (not pushing herself at all) and saw HR went down to 58 instead of increasing. She denies any symptoms, no dizziness, light-headedness, SOB, etc. Pt just wanted to make sure this was ok. I told pt sometimes monitors at gym may not be very accurate, and with her meds may be more difficult to incr HR with cardio. This was new for her, normally HR in 70s. BP normally [id number] systolic. Pt does have h/o a fib, sick sinus syndrome/tachy-brady, PPM with last check normal 07/2011, sees Dr. Caryl Comes. Pt says Dr. Kary Kos recently incr her cardizem to 360mg  (from 240) for high BP, and she also takes Bystolic 20mg . Pt had c/o SOB and palps back in 07/2011 and she reports these have not occurred in past few months. Overall reports doing well. She had scheduled f/u with TG for 12/6 because of above "episode," but I told pt we can cancel appt as long as she is asymptomatic. Pt ok with this, she will monitor BP/HR and if continues to decr or develops symptoms, she will call office. Otherwise, she will f/u in 01/2012 with Dr. Caryl Comes for pacer check.

## 2011-11-09 ENCOUNTER — Ambulatory Visit: Payer: Medicare Other | Admitting: Cardiovascular Disease

## 2012-01-02 ENCOUNTER — Encounter: Payer: Self-pay | Admitting: Cardiovascular Disease

## 2012-01-08 ENCOUNTER — Encounter: Payer: Self-pay | Admitting: Cardiovascular Disease

## 2012-01-08 ENCOUNTER — Ambulatory Visit (INDEPENDENT_AMBULATORY_CARE_PROVIDER_SITE_OTHER): Payer: Medicare Other | Admitting: Cardiovascular Disease

## 2012-01-08 VITALS — BP 140/77 | HR 96 | Resp 16 | Ht 65.0 in | Wt 252.0 lb

## 2012-01-08 DIAGNOSIS — E119 Type 2 diabetes mellitus without complications: Secondary | ICD-10-CM

## 2012-01-08 DIAGNOSIS — I4949 Other premature depolarization: Secondary | ICD-10-CM

## 2012-01-08 DIAGNOSIS — I10 Essential (primary) hypertension: Secondary | ICD-10-CM

## 2012-01-08 DIAGNOSIS — R42 Dizziness and giddiness: Secondary | ICD-10-CM

## 2012-01-08 DIAGNOSIS — R0989 Other specified symptoms and signs involving the circulatory and respiratory systems: Secondary | ICD-10-CM

## 2012-01-08 DIAGNOSIS — G4733 Obstructive sleep apnea (adult) (pediatric): Secondary | ICD-10-CM

## 2012-01-08 DIAGNOSIS — I4891 Unspecified atrial fibrillation: Secondary | ICD-10-CM

## 2012-01-08 NOTE — Assessment & Plan Note (Signed)
Blood pressure is well controlled on today's visit. No changes made to the medications. 

## 2012-01-08 NOTE — Assessment & Plan Note (Signed)
Etiology of her lightheaded spells is uncertain. She has these when using her arms in particular, and when performing activity. When she fluffs her pillows, claps her hands, She has symptoms. Uncertain if this is secondary to pacer placement on the right. We have suggested we look into her rate response with activity.

## 2012-01-08 NOTE — Patient Instructions (Addendum)
You are doing well. No medication changes were made. We will order a carotid ultrasound for the pulsating sound you are hearing  Please call us if you have new issues that need to be addressed before your next appt.  Your physician wants you to follow-up in: 6 months.  You will receive a reminder letter in the mail two months in advance. If you don't receive a letter, please call our office to schedule the follow-up appointment.

## 2012-01-08 NOTE — Assessment & Plan Note (Signed)
We have encouraged continued exercise, careful diet management in an effort to lose weight. 

## 2012-01-08 NOTE — Progress Notes (Signed)
Patient ID: [name] [name], female    DOB: [birth year], 77 y.o.   MRN: [id number]  HPI Comments: [name] [name] is a 77 yo woman with a history of atrial fibrillation, symptomatic palpitations/PVCs, status post pacemaker implantation for sick sinus syndrome/tachybradycardia syndrome with chronic symptoms of palpitations, shortness of breath with exertion, who presents for routine followup.   Earlier in the year, she had significant symptoms of shortness of breath and palpitations. In May 2012, she had adjustments to her pacemaker and since that time has felt back to normal.   she has had hot flashes, and a racing heart rate when she is in bed. She has not been exercising since Thanksgiving.  She has a list of issues that are bothering her including a pulsation in her ears predominantly in the nighttime as well as the day. Some lightheadedness with certain activities such as standing and clapping in church, sloughing the pillows. She has symptoms with impact, particularly on her right arm. Symptoms typically come on with exertion. She has to take Xanax at night to sleep. She uses her CPAP and would like [place] to renew her CPAP machine so she can have a new one.   her cardiac workup in the past has included  cardiac catheterization in 2004, stress testing,   echo  showed normal systolic function, mild LVH, diastolic relaxation abnormality, mildly elevated right ventricular systolic pressures, mild MR. She had a treadmill study showing normal blood pressure response to exercise, chronotropic incompetence with peak heart rate of 100 beats per minute, resting heart rate in the 70s. Previous  PFTs  demonstrated extrinsic compression/restrictive physiology.   EKG shows Paced rhythm,  rate 62 beats per minute, Paced rhythm, no significant ST or T wave changes       Outpatient Encounter Prescriptions as of 01/08/2012  Medication Sig Dispense Refill  . ALPRAZolam (XANAX) 0.5 MG tablet Take 0.5 mg by mouth. 1/2-1 prn        . B Complex-C-Min-Fe-FA (HEMATINIC PLUS VIT/MINERALS PO) Take by mouth daily.        Marland Kitchen diltiazem (CARDIZEM CD) 360 MG 24 hr capsule Take 360 mg by mouth daily.        Marland Kitchen esomeprazole (NEXIUM) 40 MG capsule Take 40 mg by mouth 2 (two) times daily.        . febuxostat (ULORIC) 40 MG tablet Take 40 mg by mouth daily.       Marland Kitchen LIPITOR 20 MG tablet TAKE ONE TABLET BY MOUTH DAILY.  30 tablet  5  . metFORMIN (GLUMETZA) 500 MG (MOD) 24 hr tablet Take 500 mg by mouth 2 (two) times daily.        . mometasone (ELOCON) 0.1 % cream Apply topically daily. Apply to ear as directed two times a day for 10 days as needed         . Nebivolol HCl (BYSTOLIC) 20 MG TABS Take 1 tablet (20 mg total) by mouth daily.  30 tablet  6  . NON FORMULARY C-PAP (at bedtime)       . olmesartan (BENICAR) 40 MG tablet Take 40 mg by mouth daily.      . Omega-3 Fatty Acids (FISH OIL) 1000 MG CAPS Take by mouth daily.        . sitaGLIPtan (JANUVIA) 100 MG tablet Take 100 mg by mouth daily.        . travoprost, benzalkonium, (TRAVATAN) 0.004 % ophthalmic solution 1 drop as directed.        Marland Kitchen  warfarin (COUMADIN) 3 MG tablet Take 3 mg by mouth as directed.      . warfarin (COUMADIN) 4 MG tablet Take 4 mg by mouth daily. As directed by coumadin clinic          Review of Systems  Constitutional: Negative.   HENT: Negative.   Eyes: Negative.   Gastrointestinal: Negative.   Musculoskeletal: Negative.   Skin: Negative.   Neurological: Positive for dizziness and light-headedness.  Hematological: Negative.   Psychiatric/Behavioral: Negative.   All other systems reviewed and are negative.    BP 140/77  Pulse 96  Resp 16  Ht 5\' 5"  (1.651 m)  Wt 252 lb (114.306 kg)  BMI 41.93 kg/m2  Physical Exam  Nursing note and vitals reviewed. Constitutional: She is oriented to person, place, and time. She appears well-developed and well-nourished.       obese  HENT:  Head: Normocephalic.  Nose: Nose normal.  Mouth/Throat:  Oropharynx is clear and moist.  Eyes: Conjunctivae are normal. Pupils are equal, round, and reactive to light.  Neck: Normal range of motion. Neck supple. No JVD present.  Cardiovascular: Normal rate, regular rhythm, S1 normal, S2 normal, normal heart sounds and intact distal pulses.  Exam reveals no gallop and no friction rub.   No murmur heard. Pulmonary/Chest: Effort normal and breath sounds normal. No respiratory distress. She has no wheezes. She has no rales. She exhibits no tenderness.  Abdominal: Soft. Bowel sounds are normal. She exhibits no distension. There is no tenderness.  Musculoskeletal: Normal range of motion. She exhibits no edema and no tenderness.  Lymphadenopathy:    She has no cervical adenopathy.  Neurological: She is alert and oriented to person, place, and time. Coordination normal.  Skin: Skin is warm and dry. No rash noted. No erythema.  Psychiatric: She has a normal mood and affect. Her behavior is normal. Judgment and thought content normal.         Assessment and Plan

## 2012-01-08 NOTE — Assessment & Plan Note (Signed)
No complaints of palpitations on today's visit.  Pacemaker managed by Dr. Caryl Comes.

## 2012-01-08 NOTE — Assessment & Plan Note (Signed)
She wears CPAP. Uncertain who has been managing this. She is asking today for [place] to Get her a new machine.

## 2012-01-23 ENCOUNTER — Encounter (INDEPENDENT_AMBULATORY_CARE_PROVIDER_SITE_OTHER): Payer: Medicare Other

## 2012-01-23 DIAGNOSIS — I4949 Other premature depolarization: Secondary | ICD-10-CM

## 2012-01-23 DIAGNOSIS — R0989 Other specified symptoms and signs involving the circulatory and respiratory systems: Secondary | ICD-10-CM

## 2012-01-29 NOTE — Progress Notes (Signed)
N/A.  LMTC. 

## 2012-01-30 ENCOUNTER — Telehealth: Payer: Self-pay | Admitting: Cardiovascular Disease

## 2012-01-30 NOTE — Telephone Encounter (Signed)
Patient aware of Carotic doppler results. She verbalized understanding.

## 2012-01-30 NOTE — Telephone Encounter (Signed)
Fu call Patient calling back about test results

## 2012-02-09 ENCOUNTER — Ambulatory Visit (INDEPENDENT_AMBULATORY_CARE_PROVIDER_SITE_OTHER): Payer: Medicare Other | Admitting: *Deleted

## 2012-02-09 ENCOUNTER — Encounter: Payer: Self-pay | Admitting: Internal Medicine

## 2012-02-09 DIAGNOSIS — I495 Sick sinus syndrome: Secondary | ICD-10-CM

## 2012-02-09 LAB — PACEMAKER DEVICE OBSERVATION
AL AMPLITUDE: 1.2369 mv
AL IMPEDENCE PM: 536 Ohm
AL THRESHOLD: 0.5 V
ATRIAL PACING PM: 92.75
BAMS-0001: 135 {beats}/min
BATTERY VOLTAGE: 2.97 V
RV LEAD AMPLITUDE: 5.9696 mv
RV LEAD IMPEDENCE PM: 584 Ohm
RV LEAD THRESHOLD: 1 V
VENTRICULAR PACING PM: 4.05

## 2012-02-09 NOTE — Progress Notes (Signed)
PPM check 

## 2012-02-29 ENCOUNTER — Other Ambulatory Visit: Payer: Self-pay | Admitting: Internal Medicine

## 2012-05-04 ENCOUNTER — Other Ambulatory Visit: Payer: Self-pay | Admitting: Cardiovascular Disease

## 2012-05-06 ENCOUNTER — Other Ambulatory Visit: Payer: Self-pay | Admitting: *Deleted

## 2012-05-06 MED ORDER — NEBIVOLOL HCL 20 MG PO TABS: 20.0000 mg | ORAL_TABLET | Freq: Every day | ORAL | Status: DC

## 2012-05-06 NOTE — Telephone Encounter (Signed)
Refilled Bystolic

## 2012-05-13 ENCOUNTER — Ambulatory Visit: Payer: Medicare Other | Admitting: Cardiovascular Disease

## 2012-05-30 ENCOUNTER — Encounter: Payer: Self-pay | Admitting: Cardiovascular Disease

## 2012-05-30 ENCOUNTER — Ambulatory Visit (INDEPENDENT_AMBULATORY_CARE_PROVIDER_SITE_OTHER): Payer: Medicare Other | Admitting: Cardiovascular Disease

## 2012-05-30 VITALS — BP 135/70 | HR 68 | Ht 65.0 in | Wt 254.5 lb

## 2012-05-30 DIAGNOSIS — E119 Type 2 diabetes mellitus without complications: Secondary | ICD-10-CM

## 2012-05-30 DIAGNOSIS — I4891 Unspecified atrial fibrillation: Secondary | ICD-10-CM

## 2012-05-30 DIAGNOSIS — R609 Edema, unspecified: Secondary | ICD-10-CM

## 2012-05-30 DIAGNOSIS — R0602 Shortness of breath: Secondary | ICD-10-CM

## 2012-05-30 DIAGNOSIS — I10 Essential (primary) hypertension: Secondary | ICD-10-CM

## 2012-05-30 DIAGNOSIS — M549 Dorsalgia, unspecified: Secondary | ICD-10-CM

## 2012-05-30 NOTE — Assessment & Plan Note (Signed)
History of pacemaker placement, previous stenting changes in 2012, has been doing well since then.

## 2012-05-30 NOTE — Assessment & Plan Note (Signed)
She reports that her be his issue is her back and hip pain. She has followup with orthopedics for cortisone shot

## 2012-05-30 NOTE — Patient Instructions (Addendum)
You are doing well. No medication changes were made.  Please call us if you have new issues that need to be addressed before your next appt.  Your physician wants you to follow-up in: 6 months.  You will receive a reminder letter in the mail two months in advance. If you don't receive a letter, please call our office to schedule the follow-up appointment.   

## 2012-05-30 NOTE — Assessment & Plan Note (Signed)
Blood pressure is well controlled on today's visit. No changes made to the medications. 

## 2012-05-30 NOTE — Assessment & Plan Note (Signed)
We have encouraged continued exercise, careful diet management in an effort to lose weight. 

## 2012-05-30 NOTE — Assessment & Plan Note (Signed)
Edema appears to be relatively stable. No changes made to her medications

## 2012-05-30 NOTE — Progress Notes (Signed)
Patient ID: [name] [name], female    DOB: [birth year], 77 y.o.   MRN: [id number]  HPI Comments: [name] [name] is a 77 yo woman with a history of atrial fibrillation, symptomatic palpitations/PVCs, status post pacemaker implantation for sick sinus syndrome/tachybradycardia syndrome with chronic symptoms of palpitations, shortness of breath with exertion, who presents for routine followup.   Earlier in the year, she had significant symptoms of shortness of breath and palpitations. In May 2012, she had adjustments to her pacemaker and since that time has felt back to normal.   she has had hot flashes, and a racing heart rate when she is in bed.  She reports that currently she feels well. She does have chronic back and hip pain. She did receive a cortisone shot on the left with no significant improvement. He caused her sugars to go high. She has followup again with orthopedics for a repeat cortisone shot. She has chronic pain in her legs. She's not exercising at all secondary to hip and back pain. She uses her CPAP   her cardiac workup in the past has included  cardiac catheterization in 2004, stress testing,   echo  showed normal systolic function, mild LVH, diastolic relaxation abnormality, mildly elevated right ventricular systolic pressures, mild MR. She had a treadmill study showing normal blood pressure response to exercise, chronotropic incompetence with peak heart rate of 100 beats per minute, resting heart rate in the 70s. Previous  PFTs  demonstrated extrinsic compression/restrictive physiology.   EKG shows Paced rhythm,  rate 67 beats per minute, no significant ST or T wave changes      Outpatient Encounter Prescriptions as of 05/30/2012  Medication Sig Dispense Refill  . ALPRAZolam (XANAX) 0.5 MG tablet Take 0.5 mg by mouth. 1/2-1 prn       . atorvastatin (LIPITOR) 20 MG tablet TAKE ONE TABLET BY MOUTH DAILY.  30 tablet  5  . B Complex-C-Min-Fe-FA (HEMATINIC PLUS VIT/MINERALS PO) Take by  mouth daily.        Marland Kitchen diltiazem (CARDIZEM CD) 360 MG 24 hr capsule Take 360 mg by mouth daily.        Marland Kitchen esomeprazole (NEXIUM) 40 MG capsule Take 40 mg by mouth 2 (two) times daily.        . metFORMIN (GLUMETZA) 500 MG (MOD) 24 hr tablet Take 500 mg by mouth 2 (two) times daily.        . mometasone (ELOCON) 0.1 % cream Apply topically daily. Apply to ear as directed two times a day for 10 days as needed         . Nebivolol HCl (BYSTOLIC) 20 MG TABS Take 1 tablet (20 mg total) by mouth daily.  30 tablet  6  . NON FORMULARY C-PAP (at bedtime)       . olmesartan (BENICAR) 40 MG tablet Take 40 mg by mouth daily.      . Omega-3 Fatty Acids (FISH OIL) 1000 MG CAPS Take by mouth daily.        . sitaGLIPtan (JANUVIA) 100 MG tablet Take 100 mg by mouth daily.        . travoprost, benzalkonium, (TRAVATAN) 0.004 % ophthalmic solution 1 drop as directed.        . warfarin (COUMADIN) 3 MG tablet 3 mg. Takes on Tues, Thurs, Sat and Sun. Takes 3 and 1/2 tablet on Mon, Wed and Friday.       Review of Systems  Constitutional: Negative.   HENT: Negative.   Eyes:  Negative.   Gastrointestinal: Negative.   Musculoskeletal: Positive for myalgias, back pain, arthralgias and gait problem.  Skin: Negative.   Hematological: Negative.   Psychiatric/Behavioral: Negative.   All other systems reviewed and are negative.    BP 135/70  Pulse 68  Ht 5\' 5"  (1.651 m)  Wt 254 lb 8 oz (115.44 kg)  BMI 42.35 kg/m2  Physical Exam  Nursing note and vitals reviewed. Constitutional: She is oriented to person, place, and time. She appears well-developed and well-nourished.       obese  HENT:  Head: Normocephalic.  Nose: Nose normal.  Mouth/Throat: Oropharynx is clear and moist.  Eyes: Conjunctivae are normal. Pupils are equal, round, and reactive to light.  Neck: Normal range of motion. Neck supple. No JVD present.  Cardiovascular: Normal rate, regular rhythm, S1 normal, S2 normal, normal heart sounds and intact  distal pulses.  Exam reveals no gallop and no friction rub.   No murmur heard. Pulmonary/Chest: Effort normal and breath sounds normal. No respiratory distress. She has no wheezes. She has no rales. She exhibits no tenderness.  Abdominal: Soft. Bowel sounds are normal. She exhibits no distension. There is no tenderness.  Musculoskeletal: Normal range of motion. She exhibits no edema and no tenderness.  Lymphadenopathy:    She has no cervical adenopathy.  Neurological: She is alert and oriented to person, place, and time. Coordination normal.  Skin: Skin is warm and dry. No rash noted. No erythema.  Psychiatric: She has a normal mood and affect. Her behavior is normal. Judgment and thought content normal.         Assessment and Plan

## 2012-07-08 ENCOUNTER — Ambulatory Visit: Payer: Medicare Other | Admitting: Cardiovascular Disease

## 2012-08-01 ENCOUNTER — Observation Stay: Payer: Self-pay | Admitting: Internal Medicine

## 2012-08-01 LAB — TROPONIN I
Troponin-I: <0.02 ng/mL
Troponin-I: <0.02 ng/mL
Troponin-I: <0.02 ng/mL

## 2012-08-01 LAB — URINALYSIS, COMPLETE
Bilirubin,UR: NEGATIVE
Blood: NEGATIVE
Glucose,UR: 150 mg/dL (ref 0–75)
Ketone: NEGATIVE
Leukocyte Esterase: NEGATIVE
Nitrite: NEGATIVE
Ph: 6 (ref 4.5–8.0)
Protein: NEGATIVE
RBC,UR: 1 /HPF (ref 0–5)
Specific Gravity: 1.005 (ref 1.003–1.030)
Squamous Epithelial: 1
WBC UR: 1 /HPF (ref 0–5)

## 2012-08-01 LAB — CBC
HCT: 34.1 % — ABNORMAL LOW (ref 35.0–47.0)
HGB: 11.2 g/dL — ABNORMAL LOW (ref 12.0–16.0)
MCH: 24.1 pg — ABNORMAL LOW (ref 26.0–34.0)
MCHC: 32.7 g/dL (ref 32.0–36.0)
MCV: 74 fL — ABNORMAL LOW (ref 80–100)
Platelet: 213 10*3/uL (ref 150–440)
RBC: 4.64 10*6/uL (ref 3.80–5.20)
RDW: 16.3 % — ABNORMAL HIGH (ref 11.5–14.5)
WBC: 10 10*3/uL (ref 3.6–11.0)

## 2012-08-01 LAB — COMPREHENSIVE METABOLIC PANEL
Albumin: 3.3 g/dL — ABNORMAL LOW (ref 3.4–5.0)
Alkaline Phosphatase: 132 U/L (ref 50–136)
Anion Gap: 9 (ref 7–16)
BUN: 13 mg/dL (ref 7–18)
Bilirubin,Total: 0.3 mg/dL (ref 0.2–1.0)
Calcium, Total: 9.1 mg/dL (ref 8.5–10.1)
Chloride: 105 mmol/L (ref 98–107)
Co2: 28 mmol/L (ref 21–32)
Creatinine: 1.37 mg/dL — ABNORMAL HIGH (ref 0.60–1.30)
EGFR (African American): 44 — ABNORMAL LOW
EGFR (Non-African Amer.): 38 — ABNORMAL LOW
Glucose: 248 mg/dL — ABNORMAL HIGH (ref 65–99)
Osmolality: 292 (ref 275–301)
Potassium: 3.8 mmol/L (ref 3.5–5.1)
SGOT(AST): 29 U/L (ref 15–37)
SGPT (ALT): 31 U/L (ref 12–78)
Sodium: 142 mmol/L (ref 136–145)
Total Protein: 7.2 g/dL (ref 6.4–8.2)

## 2012-08-01 LAB — PROTIME-INR
INR: 1.8
Prothrombin Time: 20.9 secs — ABNORMAL HIGH (ref 11.5–14.7)

## 2012-08-01 LAB — CK-MB
CK-MB: 1.3 ng/mL (ref 0.5–3.6)
CK-MB: 1.2 ng/mL (ref 0.5–3.6)

## 2012-08-02 LAB — PROTIME-INR
INR: 1.9
Prothrombin Time: 22.1 secs — ABNORMAL HIGH (ref 11.5–14.7)

## 2012-08-14 ENCOUNTER — Telehealth: Payer: Self-pay | Admitting: Cardiovascular Disease

## 2012-08-14 NOTE — Telephone Encounter (Signed)
Pt calling states that she fell at home on 8/28 and was in hospital. Pt was told in the hospital that it was heart related. Pt was at [facility] for 2 days and was not seen by cardiology. Pt has a device. Has appt for device check on 9/20 here. Pt still feels some dizziness and wants to know if she needs to see gollan

## 2012-08-14 NOTE — Telephone Encounter (Signed)
Pt says she was admitted to [facility] 8/28 for syncope/fall. She was then d/c to rehab and was d/c home yesterday. She has a PPM and is scheduled for PPM check/OV with Dr. Caryl Comes 9/20. She is c/o dizziness and feeling "swimmy headed". She says BP=130/70, 145/70 and med list was confirmed.  She is taking meds as prescribed at d/c. She does not have home Pacemaker box to send transmission.  Dr. Caryl Comes has a cancellation tomm here in [place] office. I have advised pt to come in tomm to be seen/have pacemaker checked to further assess symptoms.  Understanding verb.

## 2012-08-15 ENCOUNTER — Ambulatory Visit (INDEPENDENT_AMBULATORY_CARE_PROVIDER_SITE_OTHER): Payer: Medicare Other | Admitting: Internal Medicine

## 2012-08-15 ENCOUNTER — Encounter: Payer: Self-pay | Admitting: Internal Medicine

## 2012-08-15 VITALS — BP 142/62 | HR 63 | Ht 65.0 in | Wt 256.5 lb

## 2012-08-15 DIAGNOSIS — I4891 Unspecified atrial fibrillation: Secondary | ICD-10-CM

## 2012-08-15 DIAGNOSIS — I495 Sick sinus syndrome: Secondary | ICD-10-CM

## 2012-08-15 DIAGNOSIS — R55 Syncope and collapse: Secondary | ICD-10-CM

## 2012-08-15 LAB — PACEMAKER DEVICE OBSERVATION
AL AMPLITUDE: 1.5903 mv
AL IMPEDENCE PM: 512 Ohm
AL THRESHOLD: 0.5 v
ATRIAL PACING PM: 96.25
BAMS-0001: 135 {beats}/min
BATTERY VOLTAGE: 2.96 v
RV LEAD AMPLITUDE: 6.5666 mv
RV LEAD IMPEDENCE PM: 616 Ohm
RV LEAD THRESHOLD: 1 v
VENTRICULAR PACING PM: 14.01

## 2012-08-15 NOTE — Patient Instructions (Addendum)
Your physician wants you to follow-up in: 1 year with Dr. Caryl Comes and 6 months with device check. You will receive a reminder letter in the mail two months in advance. If you don't receive a letter, please call our office to schedule the follow-up appointment.

## 2012-08-15 NOTE — Progress Notes (Signed)
Patient Care Team: [place] Pink, MD as PCP - General (Family Medicine)   HPI  [name] [name] is a 77 y.o. female Seen in followup for pacemaker implantation for sinus node dysfunction and chronotropic incompetence. She also has a history of atrial fibrillation and PVCs. She had episode of syncope that prompted a hospitalization at [facility]. Unfortunately we were not called although on interrogation of the device nothing was seen.  The episode occurred in her bedroom. She was standing at the side of the bed folding towels. The next thing she remembers she awakened in the bathroom which was about 8-10 feet to her right on a direct line. She was between the commode and the shower. The towel was next to her. Echo at the hospital was negative. Telemetry was unrevealing. Orthostatics were not available record.     Past Medical History  Diagnosis Date  . Sinoatrial node dysfunction   . Atrial fibrillation   . Unspecified essential hypertension   . Cardiac pacemaker in situ   . Esophageal reflux   . Unspecified glaucoma   . Type II or unspecified type diabetes mellitus without mention of complication, not stated as uncontrolled   . Unspecified glaucoma   . Type II or unspecified type diabetes mellitus without mention of complication, not stated as uncontrolled   . Obstructive sleep apnea   . Obese   . Palpitations   . History of UTI   . Syncope and collapse   . History of syncope     Past Surgical History  Procedure Date  . Hysterectomy (other) 1970  . Cyst on right leg 03/1998  . Pacemaker insertion 2008    Current Outpatient Prescriptions  Medication Sig Dispense Refill  . ALPRAZolam (XANAX) 0.5 MG tablet Take 0.5 mg by mouth. 1/2-1 prn       . atorvastatin (LIPITOR) 20 MG tablet TAKE ONE TABLET BY MOUTH DAILY.  30 tablet  5  . B Complex-C-Min-Fe-FA (HEMATINIC PLUS VIT/MINERALS PO) Take by mouth daily.        . brinzolamide (AZOPT) 1 % ophthalmic suspension Place 1 drop into both  eyes 3 (three) times daily.      Marland Kitchen diltiazem (CARDIZEM CD) 360 MG 24 hr capsule Take 360 mg by mouth daily.        Marland Kitchen esomeprazole (NEXIUM) 40 MG capsule Take 40 mg by mouth 2 (two) times daily.        . furosemide (LASIX) 20 MG tablet Take 20 mg by mouth as needed.      Marland Kitchen glipiZIDE (GLUCOTROL) 10 MG tablet Take 10 mg by mouth 2 (two) times daily before a meal.      . mometasone (ELOCON) 0.1 % cream Apply topically daily. Apply to ear as directed two times a day for 10 days as needed         . Nebivolol HCl (BYSTOLIC) 20 MG TABS Take 1 tablet (20 mg total) by mouth daily.  30 tablet  6  . NON FORMULARY c-pap      . NON FORMULARY Stool softner 100 mg twice daily.      Marland Kitchen olmesartan (BENICAR) 40 MG tablet Take 40 mg by mouth daily.      . Omega-3 Fatty Acids (FISH OIL TRIPLE STRENGTH) 1400 MG CAPS Take by mouth daily.      . pioglitazone (ACTOS) 15 MG tablet Take 15 mg by mouth daily.      . travoprost, benzalkonium, (TRAVATAN) 0.004 % ophthalmic solution 1 drop as directed.        Marland Kitchen  warfarin (COUMADIN) 3 MG tablet 3 mg. Takes on Tues, Thurs, Sat and Sun. Takes 3 and 1/2 tablet on Mon, Wed and Friday.        No Known Allergies  Review of Systems negative except from HPI and PMH  Physical Exam BP 142/62  Pulse 63  Ht 5\' 5"  (1.651 m)  Wt 256 lb 8 oz (116.348 kg)  BMI 42.68 kg/m2 Well developed and well nourished in no acute distress HENT normal E scleral and icterus clear Neck Supple JVP flat; carotids brisk and full Clear to ausculation  Regular rate and rhythm, no murmurs gallops or rub Soft with active bowel sounds No clubbing cyanosis1+[place] some tenderness Alert and oriented, grossly normal motor and sensory function Skin Warm and Dry    Assessment and  Plan

## 2012-08-15 NOTE — Assessment & Plan Note (Signed)
Infrequent atrial fibrillation it is appropriate that she continue on Coumadin

## 2012-08-15 NOTE — Assessment & Plan Note (Signed)
She is atrially paced about 85% of the time

## 2012-08-15 NOTE — Assessment & Plan Note (Addendum)
The patient had a syncopal episode wherein she moved from the bedroom threw a closet into the bathroom with towel in hand.  Interrogation of her device not demonstrating an atrial high rate or a ventricular high rate episode making a rhythmic cause exceedingly unlikely. An echo failing to demonstrate obstructive disease in her heart makes that also unlikely. Hence we are left with a vasomotor episode almost certainly, or a fall. She has a history lightheadedness and presyncope associated with rapid turning of her head. she denies orthostatic lightheadedness. She has tripped on one occasion.  She is unaware of any prodromal symptoms prior to falling on the floor. However, to have needed from the bed room to the bathroom more than 10-15 feet would require a number of seconds. The fact that towel was in hand makes me think that she was heading that way although I guess she could have stumbled all the way in there holding it. I would've expected and the latter situation however for her to drop towel in route. In any case I told her to try to be aware of her normal symptoms and if she has any to lie down or down   We discussed the syncopal episode for about 30 minutes

## 2012-08-26 ENCOUNTER — Encounter: Payer: Self-pay | Admitting: Cardiovascular Disease

## 2012-08-26 ENCOUNTER — Ambulatory Visit (INDEPENDENT_AMBULATORY_CARE_PROVIDER_SITE_OTHER): Payer: Medicare Other | Admitting: Cardiovascular Disease

## 2012-08-26 VITALS — BP 134/83 | HR 64 | Ht 65.0 in | Wt 260.0 lb

## 2012-08-26 DIAGNOSIS — I4891 Unspecified atrial fibrillation: Secondary | ICD-10-CM

## 2012-08-26 DIAGNOSIS — I10 Essential (primary) hypertension: Secondary | ICD-10-CM

## 2012-08-26 DIAGNOSIS — R55 Syncope and collapse: Secondary | ICD-10-CM

## 2012-08-26 DIAGNOSIS — R609 Edema, unspecified: Secondary | ICD-10-CM

## 2012-08-26 DIAGNOSIS — E119 Type 2 diabetes mellitus without complications: Secondary | ICD-10-CM

## 2012-08-26 DIAGNOSIS — G4733 Obstructive sleep apnea (adult) (pediatric): Secondary | ICD-10-CM

## 2012-08-26 MED ORDER — DILTIAZEM HCL ER COATED BEADS 240 MG PO CP24: 240.0000 mg | ORAL_CAPSULE | Freq: Every day | ORAL | Status: DC

## 2012-08-26 NOTE — Progress Notes (Signed)
Patient ID: [name] [name], female    DOB: 1935/05/17, 77 y.o.   MRN: [id number]  HPI Comments: [name] [name] is a 77 yo woman with a history of atrial fibrillation, symptomatic palpitations/PVCs, status post pacemaker implantation for sick sinus syndrome/tachybradycardia syndrome with chronic symptoms of palpitations, shortness of breath with exertion, who presents for routine followup.   Earlier in the year, she had significant symptoms of shortness of breath and palpitations. In May 2012, she had adjustments to her pacemaker and since that time has felt back to normal.   she has had hot flashes, and a racing heart rate when she is in bed.    chronic back and hip pain. She did receive a cortisone shot on the left with no significant improvement. She uses her CPAP  She does report having a syncope episode. She was in her bedroom folding clothes and the next thing she knew she found herself on the floor in the bathroom. She spent 2 days in the hospital, then at least 10 days in rehabilitation for significant left arm bruising and she was on warfarin. Pacemaker was interrogated after the fact showing 85% atrial paced rhythm, no other arrhythmia. Etiology concerning for vasovagal episode, though exact cause is uncertain.  She continues to have episodes of feeling hot and dizzy, particularly when she is about walking. Usually they resolve without intervention but she is quite symptomatic. She does report having some edema, mild.  her cardiac workup in the past has included  cardiac catheterization in 2004, stress testing,   echo  showed normal systolic function, mild LVH, diastolic relaxation abnormality, mildly elevated right ventricular systolic pressures, mild MR. She had a treadmill study showing normal blood pressure response to exercise, chronotropic incompetence with peak heart rate of 100 beats per minute, resting heart rate in the 70s. Previous  PFTs  demonstrated extrinsic compression/restrictive  physiology.   EKG shows sinus rhythm  rate 64 beats per minute, no significant ST or T wave changes      Outpatient Encounter Prescriptions as of 08/26/2012  Medication Sig Dispense Refill  . ALPRAZolam (XANAX) 0.5 MG tablet Take 0.5 mg by mouth. 1/2-1 prn       . atorvastatin (LIPITOR) 20 MG tablet TAKE ONE TABLET BY MOUTH DAILY.  30 tablet  5  . B Complex-C-Min-Fe-FA (HEMATINIC PLUS VIT/MINERALS PO) Take by mouth daily.        . brinzolamide (AZOPT) 1 % ophthalmic suspension Place 1 drop into both eyes 3 (three) times daily.      Marland Kitchen esomeprazole (NEXIUM) 40 MG capsule Take 40 mg by mouth 2 (two) times daily.        Marland Kitchen glipiZIDE (GLUCOTROL) 10 MG tablet Take 10 mg by mouth 2 (two) times daily before a meal.      . mometasone (ELOCON) 0.1 % cream Apply topically daily. Apply to ear as directed two times a day for 10 days as needed         . Nebivolol HCl (BYSTOLIC) 20 MG TABS Take 1 tablet (20 mg total) by mouth daily.  30 tablet  6  . NON FORMULARY c-pap      . NON FORMULARY Stool softner 100 mg twice daily.      Marland Kitchen olmesartan (BENICAR) 40 MG tablet Take 40 mg by mouth daily.      . Omega-3 Fatty Acids (FISH OIL TRIPLE STRENGTH) 1400 MG CAPS Take by mouth daily.      . pioglitazone (ACTOS) 15 MG tablet  Take 15 mg by mouth daily.      . travoprost, benzalkonium, (TRAVATAN) 0.004 % ophthalmic solution 1 drop as directed.        . warfarin (COUMADIN) 3 MG tablet 3 mg. Takes on Tues, Thurs, Sat and Sun. Takes 3 and 1/2 tablet on Mon, Wed and Friday.      .  diltiazem (CARDIZEM CD) 360 MG 24 hr capsule Take 360 mg by mouth daily.        Marland Kitchen DISCONTD: furosemide (LASIX) 20 MG tablet Take 20 mg by mouth as needed.        Review of Systems  Constitutional: Negative.   HENT: Negative.   Eyes: Negative.   Gastrointestinal: Negative.   Musculoskeletal: Positive for myalgias, back pain, arthralgias and gait problem.  Skin: Negative.   Neurological: Positive for syncope.  Hematological: Negative.    Psychiatric/Behavioral: Negative.   All other systems reviewed and are negative.    BP 134/83  Pulse 64  Ht 5\' 5"  (1.651 m)  Wt 260 lb (117.935 kg)  BMI 43.27 kg/m2  Physical Exam  Nursing note and vitals reviewed. Constitutional: She is oriented to person, place, and time. She appears well-developed and well-nourished.       obese  HENT:  Head: Normocephalic.  Nose: Nose normal.  Mouth/Throat: Oropharynx is clear and moist.  Eyes: Conjunctivae normal are normal. Pupils are equal, round, and reactive to light.  Neck: Normal range of motion. Neck supple. No JVD present.  Cardiovascular: Normal rate, regular rhythm, S1 normal, S2 normal, normal heart sounds and intact distal pulses.  Exam reveals no gallop and no friction rub.   No murmur heard. Pulmonary/Chest: Effort normal and breath sounds normal. No respiratory distress. She has no wheezes. She has no rales. She exhibits no tenderness.  Abdominal: Soft. Bowel sounds are normal. She exhibits no distension. There is no tenderness.  Musculoskeletal: Normal range of motion. She exhibits no edema and no tenderness.  Lymphadenopathy:    She has no cervical adenopathy.  Neurological: She is alert and oriented to person, place, and time. Coordination normal.  Skin: Skin is warm and dry. No rash noted. No erythema.  Psychiatric: She has a normal mood and affect. Her behavior is normal. Judgment and thought content normal.         Assessment and Plan

## 2012-08-26 NOTE — Assessment & Plan Note (Signed)
We have encouraged continued exercise, careful diet management in an effort to lose weight. 

## 2012-08-26 NOTE — Assessment & Plan Note (Signed)
We'll decrease the diltiazem to see if we can improve her symptoms of dizziness given recent syncope.

## 2012-08-26 NOTE — Assessment & Plan Note (Signed)
Etiology of her syncope is uncertain. No arrhythmia seen on pacemaker check. She does continue to have episodes of dizziness and hot flashes. We have suggested she decrease her diltiazem to 240 mg daily in case she is having a drop in her blood pressure.

## 2012-08-26 NOTE — Assessment & Plan Note (Signed)
She is using her CPAP.

## 2012-08-26 NOTE — Patient Instructions (Addendum)
You are doing well. Please try the lower dose cardizem 240 mg once a day instead of the 360 mg  Please call us if you have new issues that need to be addressed before your next appt.  Your physician wants you to follow-up in: 6 months.  You will receive a reminder letter in the mail two months in advance. If you don't receive a letter, please call our office to schedule the follow-up appointment.

## 2012-08-26 NOTE — Assessment & Plan Note (Signed)
Edema likely secondary to venous insufficiency, exacerbated by calcium channel blocker.

## 2012-11-06 ENCOUNTER — Ambulatory Visit: Payer: Self-pay | Admitting: Family Medicine

## 2012-11-15 ENCOUNTER — Other Ambulatory Visit: Payer: Self-pay | Admitting: Internal Medicine

## 2012-11-22 ENCOUNTER — Emergency Department: Payer: Self-pay | Admitting: Emergency Medicine

## 2012-11-22 LAB — CBC WITH DIFFERENTIAL/PLATELET
Basophil #: 0 10*3/uL (ref 0.0–0.1)
Basophil %: 0.3 %
Eosinophil #: 0.2 10*3/uL (ref 0.0–0.7)
Eosinophil %: 2.2 %
HCT: 34.2 % — ABNORMAL LOW (ref 35.0–47.0)
HGB: 10.9 g/dL — ABNORMAL LOW (ref 12.0–16.0)
Lymphocyte #: 2.1 10*3/uL (ref 1.0–3.6)
Lymphocyte %: 26.5 %
MCH: 23.7 pg — ABNORMAL LOW (ref 26.0–34.0)
MCHC: 31.7 g/dL — ABNORMAL LOW (ref 32.0–36.0)
MCV: 75 fL — ABNORMAL LOW (ref 80–100)
Monocyte #: 0.5 x10 3/mm (ref 0.2–0.9)
Monocyte %: 6.8 %
Neutrophil #: 5.1 10*3/uL (ref 1.4–6.5)
Neutrophil %: 64.2 %
Platelet: 185 10*3/uL (ref 150–440)
RBC: 4.58 10*6/uL (ref 3.80–5.20)
RDW: 16.2 % — ABNORMAL HIGH (ref 11.5–14.5)
WBC: 7.9 10*3/uL (ref 3.6–11.0)

## 2012-11-22 LAB — COMPREHENSIVE METABOLIC PANEL
Albumin: 3.5 g/dL (ref 3.4–5.0)
Alkaline Phosphatase: 117 U/L (ref 50–136)
Anion Gap: 6 — ABNORMAL LOW (ref 7–16)
BUN: 15 mg/dL (ref 7–18)
Bilirubin,Total: 0.6 mg/dL (ref 0.2–1.0)
Calcium, Total: 9.3 mg/dL (ref 8.5–10.1)
Chloride: 108 mmol/L — ABNORMAL HIGH (ref 98–107)
Co2: 27 mmol/L (ref 21–32)
Creatinine: 1.29 mg/dL (ref 0.60–1.30)
EGFR (African American): 47 — ABNORMAL LOW
EGFR (Non-African Amer.): 41 — ABNORMAL LOW
Glucose: 173 mg/dL — ABNORMAL HIGH (ref 65–99)
Osmolality: 286 (ref 275–301)
Potassium: 3.8 mmol/L (ref 3.5–5.1)
SGOT(AST): 22 U/L (ref 15–37)
SGPT (ALT): 22 U/L (ref 12–78)
Sodium: 141 mmol/L (ref 136–145)
Total Protein: 7.1 g/dL (ref 6.4–8.2)

## 2012-11-22 LAB — TROPONIN I: Troponin-I: <0.02 ng/mL

## 2013-01-06 ENCOUNTER — Other Ambulatory Visit: Payer: Self-pay | Admitting: Cardiovascular Disease

## 2013-01-06 NOTE — Telephone Encounter (Signed)
Refilled Bystolic

## 2013-01-23 LAB — PROTIME-INR: INR: 1.8 — AB (ref 0.9–1.1)

## 2013-03-11 ENCOUNTER — Ambulatory Visit (INDEPENDENT_AMBULATORY_CARE_PROVIDER_SITE_OTHER): Payer: Medicare Other | Admitting: Cardiovascular Disease

## 2013-03-11 ENCOUNTER — Ambulatory Visit (INDEPENDENT_AMBULATORY_CARE_PROVIDER_SITE_OTHER): Payer: Medicare Other | Admitting: *Deleted

## 2013-03-11 ENCOUNTER — Encounter: Payer: Self-pay | Admitting: Cardiovascular Disease

## 2013-03-11 ENCOUNTER — Other Ambulatory Visit: Payer: Self-pay

## 2013-03-11 VITALS — BP 142/82 | HR 66 | Ht 66.0 in | Wt 263.5 lb

## 2013-03-11 DIAGNOSIS — I495 Sick sinus syndrome: Secondary | ICD-10-CM

## 2013-03-11 DIAGNOSIS — R55 Syncope and collapse: Secondary | ICD-10-CM

## 2013-03-11 DIAGNOSIS — I10 Essential (primary) hypertension: Secondary | ICD-10-CM

## 2013-03-11 DIAGNOSIS — I4891 Unspecified atrial fibrillation: Secondary | ICD-10-CM

## 2013-03-11 DIAGNOSIS — E119 Type 2 diabetes mellitus without complications: Secondary | ICD-10-CM

## 2013-03-11 DIAGNOSIS — Z950 Presence of cardiac pacemaker: Secondary | ICD-10-CM

## 2013-03-11 LAB — PACEMAKER DEVICE OBSERVATION
AL AMPLITUDE: 1.5903 mv
AL IMPEDENCE PM: 424 Ohm
AL THRESHOLD: 0.5 V
ATRIAL PACING PM: 98.54
BAMS-0001: 135 {beats}/min
BATTERY VOLTAGE: 2.95 V
RV LEAD AMPLITUDE: 7.1635 mv
RV LEAD IMPEDENCE PM: 448 Ohm
RV LEAD THRESHOLD: 1 V
VENTRICULAR PACING PM: 1.41

## 2013-03-11 NOTE — Assessment & Plan Note (Signed)
Predominantly atrial paced rhythm

## 2013-03-11 NOTE — Progress Notes (Signed)
Patient ID: [name] [name], female    DOB: 01-15-1935, 78 y.o.   MRN: [id number]  HPI Comments: Mrs. Vlahakis is a 78 yo woman with a history of atrial fibrillation, symptomatic palpitations/PVCs, status post pacemaker implantation for sick sinus syndrome/tachybradycardia syndrome with chronic symptoms of palpitations, shortness of breath with exertion, who presents for routine followup.   Previous hx of shortness of breath and palpitations.  May 2012, she had adjustments to her pacemaker and since that time has felt back to normal.   she has had long hx of hot flashes, and a racing heart rate when she is in bed.   chronic back and hip pain. She did receive a cortisone shot on the left with no significant improvement. She uses her CPAP    syncope episode  last August 2013 .  in her bedroom folding clothes and the next thing she knew she found herself on the floor in the bathroom. She spent 2 days in the hospital, then at least 10 days in rehabilitation for significant left arm bruising and she was on warfarin. Pacemaker was interrogated after the fact showing 85% atrial paced rhythm, no other arrhythmia. Etiology concerning for vasovagal episode, though exact cause is uncertain.  Her biggest complaint today is buzzing in her ear. She does report this is a chronic issue going back many years. Prior workup with ear nose throat. She does have prior exposure to loud noises, worked in a [employer].   cardiac catheterization in 2004, stress testing,   echo  showed normal systolic function, mild LVH, diastolic relaxation abnormality, mildly elevated right ventricular systolic pressures, mild MR.  treadmill study showing normal blood pressure response to exercise, chronotropic incompetence with peak heart rate of 100 beats per minute, resting heart rate in the 70s. Previous  PFTs  demonstrated extrinsic compression/restrictive physiology.   Total cholesterol 155, LDL 84, hemoglobin A1c 6.5 EKG shows atrial  paced rhythm rate 66 beats per minute, no significant ST or T wave changes      Outpatient Encounter Prescriptions as of 03/11/2013  Medication Sig Dispense Refill  . ALPRAZolam (XANAX) 0.5 MG tablet Take 0.5 mg by mouth as needed. 1/2-1 prn      . atorvastatin (LIPITOR) 20 MG tablet TAKE ONE TABLET BY MOUTH DAILY.  30 tablet  6  . B Complex-C-Min-Fe-FA (HEMATINIC PLUS VIT/MINERALS PO) Take by mouth daily.        . brinzolamide (AZOPT) 1 % ophthalmic suspension Place 1 drop into both eyes 2 (two) times daily.       Marland Kitchen BYSTOLIC 20 MG TABS TAKE 1 TABLET EVERY DAY  30 tablet  6  . diltiazem (CARDIZEM CD) 240 MG 24 hr capsule Take 1 capsule (240 mg total) by mouth daily.  30 capsule  6  . esomeprazole (NEXIUM) 40 MG capsule Take 40 mg by mouth 2 (two) times daily.        Marland Kitchen glipiZIDE (GLUCOTROL) 10 MG tablet Take 10 mg by mouth 2 (two) times daily before a meal.      . metFORMIN (GLUCOPHAGE) 500 MG tablet Take 500 mg by mouth 2 (two) times daily with a meal.      . mometasone (ELOCON) 0.1 % cream Apply topically daily. Apply to ear as directed two times a day for 10 days as needed         . NON FORMULARY c-pap      . olmesartan (BENICAR) 40 MG tablet Take 40 mg by mouth daily.      Marland Kitchen  Omega-3 Fatty Acids (FISH OIL TRIPLE STRENGTH) 1400 MG CAPS Take by mouth daily.      . travoprost, benzalkonium, (TRAVATAN) 0.004 % ophthalmic solution 1 drop as directed.        . warfarin (COUMADIN) 3 MG tablet 3 mg. Takes on Tues, Thurs, Sat and Sun. Takes 3 and 1/2 tablet on Mon, Wed and Friday.      . [DISCONTINUED] NON FORMULARY Stool softner 100 mg twice daily.      . [DISCONTINUED] pioglitazone (ACTOS) 15 MG tablet Take 15 mg by mouth daily.       No facility-administered encounter medications on file as of 03/11/2013.     Review of Systems  Constitutional: Negative.   HENT: Negative.   Eyes: Negative.   Gastrointestinal: Negative.   Musculoskeletal: Positive for myalgias, back pain, arthralgias and  gait problem.  Skin: Negative.   Psychiatric/Behavioral: Negative.   All other systems reviewed and are negative.    BP 142/82  Pulse 66  Ht 5\' 6"  (1.676 m)  Wt 263 lb 8 oz (119.523 kg)  BMI 42.55 kg/m2  Physical Exam  Nursing note and vitals reviewed. Constitutional: She is oriented to person, place, and time. She appears well-developed and well-nourished.  obese  HENT:  Head: Normocephalic.  Nose: Nose normal.  Mouth/Throat: Oropharynx is clear and moist.  Eyes: Conjunctivae are normal. Pupils are equal, round, and reactive to light.  Neck: Normal range of motion. Neck supple. No JVD present.  Cardiovascular: Normal rate, regular rhythm, S1 normal, S2 normal, normal heart sounds and intact distal pulses.  Exam reveals no gallop and no friction rub.   No murmur heard. Pulmonary/Chest: Effort normal and breath sounds normal. No respiratory distress. She has no wheezes. She has no rales. She exhibits no tenderness.  Abdominal: Soft. Bowel sounds are normal. She exhibits no distension. There is no tenderness.  Musculoskeletal: Normal range of motion. She exhibits no edema and no tenderness.  Lymphadenopathy:    She has no cervical adenopathy.  Neurological: She is alert and oriented to person, place, and time. Coordination normal.  Skin: Skin is warm and dry. No rash noted. No erythema.  Psychiatric: She has a normal mood and affect. Her behavior is normal. Judgment and thought content normal.    Assessment and Plan

## 2013-03-11 NOTE — Patient Instructions (Addendum)
You are doing well. No medication changes were made.  Please call us if you have new issues that need to be addressed before your next appt.  Your physician wants you to follow-up in: 6 months.  You will receive a reminder letter in the mail two months in advance. If you don't receive a letter, please call our office to schedule the follow-up appointment.   

## 2013-03-11 NOTE — Assessment & Plan Note (Signed)
We have encouraged continued exercise, careful diet management in an effort to lose weight. 

## 2013-03-11 NOTE — Progress Notes (Signed)
PPM check 

## 2013-03-11 NOTE — Assessment & Plan Note (Signed)
Blood pressure is well controlled on today's visit. No changes made to the medications. 

## 2013-03-11 NOTE — Assessment & Plan Note (Signed)
No further episodes of syncope or near syncope.

## 2013-03-26 ENCOUNTER — Ambulatory Visit: Payer: Self-pay | Admitting: Family Medicine

## 2013-04-04 ENCOUNTER — Encounter: Payer: Self-pay | Admitting: Internal Medicine

## 2013-04-15 ENCOUNTER — Other Ambulatory Visit: Payer: Self-pay

## 2013-04-15 MED ORDER — DILTIAZEM HCL ER COATED BEADS 240 MG PO CP24: 240.0000 mg | ORAL_CAPSULE | Freq: Every day | ORAL | Status: DC

## 2013-04-15 NOTE — Telephone Encounter (Signed)
Refilled Diltiazem sent to [facility] pharmacy.

## 2013-04-15 NOTE — Telephone Encounter (Signed)
Pt states she is completely out of this medication

## 2013-04-22 ENCOUNTER — Telehealth: Payer: Self-pay

## 2013-04-22 NOTE — Telephone Encounter (Signed)
Wanted to let Dr. Rockey Situ know pt has waited til July to get the CPAP machine

## 2013-04-23 NOTE — Telephone Encounter (Signed)
fyi

## 2013-04-24 NOTE — Telephone Encounter (Signed)
Don't know what this means Does this means she has to wait until July to get her CPAP machine? Is anything we can do to expedite this?

## 2013-04-25 NOTE — Telephone Encounter (Signed)
I spoke with Vegas Coffin with [facility] who says she thinks this was pt's preference but she is going to check back into it and call me back for sure

## 2013-04-25 NOTE — Telephone Encounter (Signed)
Myiah Petkus called back from [facility] and says pt just needs "replacement CPAP", already has system. This was just an FYI Wants [place] to know they offered replacement CPAP to pt for 5/7 but she wanted to wait until July

## 2013-05-06 ENCOUNTER — Ambulatory Visit: Payer: Medicare Other | Admitting: Internal Medicine

## 2013-07-01 ENCOUNTER — Other Ambulatory Visit: Payer: Self-pay | Admitting: Internal Medicine

## 2013-07-01 DIAGNOSIS — E7800 Pure hypercholesterolemia, unspecified: Secondary | ICD-10-CM

## 2013-07-01 NOTE — Telephone Encounter (Signed)
[place] pt, who do I route this to?

## 2013-07-03 ENCOUNTER — Other Ambulatory Visit: Payer: Self-pay | Admitting: Internal Medicine

## 2013-07-15 ENCOUNTER — Ambulatory Visit: Payer: Medicare Other | Admitting: Cardiovascular Disease

## 2013-07-16 ENCOUNTER — Telehealth: Payer: Self-pay | Admitting: *Deleted

## 2013-07-16 ENCOUNTER — Encounter: Payer: Self-pay | Admitting: Physician Assistant

## 2013-07-16 ENCOUNTER — Ambulatory Visit (INDEPENDENT_AMBULATORY_CARE_PROVIDER_SITE_OTHER): Payer: Medicare Other | Admitting: Physician Assistant

## 2013-07-16 VITALS — BP 172/92 | HR 63 | Ht 66.0 in | Wt 259.5 lb

## 2013-07-16 DIAGNOSIS — I10 Essential (primary) hypertension: Secondary | ICD-10-CM

## 2013-07-16 DIAGNOSIS — R42 Dizziness and giddiness: Secondary | ICD-10-CM

## 2013-07-16 DIAGNOSIS — R002 Palpitations: Secondary | ICD-10-CM

## 2013-07-16 DIAGNOSIS — R0789 Other chest pain: Secondary | ICD-10-CM

## 2013-07-16 DIAGNOSIS — I4949 Other premature depolarization: Secondary | ICD-10-CM

## 2013-07-16 DIAGNOSIS — I4891 Unspecified atrial fibrillation: Secondary | ICD-10-CM

## 2013-07-16 DIAGNOSIS — G4733 Obstructive sleep apnea (adult) (pediatric): Secondary | ICD-10-CM

## 2013-07-16 MED ORDER — NEBIVOLOL HCL 20 MG PO TABS: 40.0000 mg | ORAL_TABLET | Freq: Every day | ORAL | Status: DC

## 2013-07-16 NOTE — Assessment & Plan Note (Addendum)
She notes recent elevation in her BP (SBP 170s) over the past week. Denies increased stress, caffeine, EtOH or tobacco. Takes medications consistently. She does note interrupted sleep with apneic episodes described as choking in the middle of the night. She is compliant with CPAP. She has worn it for 8 years. Settings may need to be titrated. Uncontrolled OSA could certainly contribute to her HTN. Will ask Dr. Brigitte Pulse to assist with this. She had trouble affording a new CPAP machine, and has not received it. Wonder if her old machine can be adjusted. In the meantime, will increase Bystolic to 40mg  daily. This may also improve episodes of tachy-palps with increased beta blockade. She has been advised to keep a daily BP log. Will see what her numbers look like on follow-up. Recent BP elevations may be outliers in otherwise well-controlled blood pressure, but may very well need more aggressive management with underlying DM2, vascular calcification and advancing age. Could consider switching amlodipine for diltiazem and nebivolol for a more rate-controlling beta blocker in the near future given African American ethnicity. Vasodilators (hydralazine, clonidine) may be another option.

## 2013-07-16 NOTE — Assessment & Plan Note (Signed)
She reports intermittent episodes of lightheadedness after standing or walking for a period of time reliably during prolonged exposure to heat (hot sun, hot shower, etc). Prodrome of weakness, diaphoresis, flushing and nausea noted. No chest pain, SOB or palpitations surrounding the event. No positional aggravation. Suspect vasovagal/vasodepressor etiology with exposure to heat. Prodrome sounds autonomic-mediated. She may have underlying reduced venous return as well given age, obesity and DM2. Have recommended avoiding stimuli (heat), staying well-hydrated and wearing compression stockings. Will reassess this strategy on follow-up with Dr. Rockey Situ next month.

## 2013-07-16 NOTE — Assessment & Plan Note (Signed)
A-paced on EKG today. Will interrogate device later this month. Continue Coumadin.

## 2013-07-16 NOTE — Assessment & Plan Note (Signed)
See hypertension section and HPI. Patient waking in the night with "choking" episodes preventing continuous, restful sleep. Adherent with CPAP use. Will request Dr. Raul Del assistance for sleep study with CPAP titration in an effort to evaluate and manage sleep apnea and sequelae.

## 2013-07-16 NOTE — Assessment & Plan Note (Signed)
The patient notes tachy-palpitations lasting for "a few minutes" once/week without associated symptoms. These episodes occur suddenly and unprovoked. She has a history of PVCs and palpitations. Unclear that these episodes represents paroxysms of a-fib with RVR. EKG today indicates appropriate A-pacing. No ectopy appreciated. Have increased Bystolic which may provide added beta blockade and improvement. Advised also to take an additional Cardizem PRN for tachy-palpitations in the evenings. Will plan to interrogate device later this month to evaluate for a-fib episodes/burden.

## 2013-07-16 NOTE — Progress Notes (Addendum)
Patient ID: [name] [name], female   DOB: May 05, 1935, 78 y.o.   MRN: [id number]   Patient ID: [name] [name], female    DOB: Jul 17, 1935, 78 y.o.   MRN: [id number]  HPI Comments: [name] [name] is a 78 yo [place] American woman with a history of paroxysmal atrial fibrillation (on chronic Coumadin), symptomatic palpitations/PVCs, sick sinus syndrome/tachy-brady syndrome s/p Medtronic dual-chamber PPM, shortness of breath with exertion, who presents for lightheadedness, elevated BP and palpitations.   Previous hx of shortness of breath and palpitations.  May 2012, she had adjustments to her pacemaker and since that time has felt back to normal.   she has had long hx of hot flashes, and a racing heart rate when she is in bed.   Syncope episode last August 2013 .  in her bedroom folding clothes and the next thing she knew she found herself on the floor in the bathroom. She spent 2 days in the hospital, then at least 10 days in rehabilitation for significant left arm bruising and she was on warfarin. Pacemaker was interrogated after the fact showing 85% atrial paced rhythm, no other arrhythmia. Etiology concerning for vasovagal episode, though exact cause is uncertain.  cardiac catheterization in 2004, stress testing,   echo  showed normal systolic function, mild LVH, diastolic relaxation abnormality, mildly elevated right ventricular systolic pressures, mild MR. treadmill study showing normal blood pressure response to exercise, chronotropic incompetence with peak heart rate of 100 beats per minute, resting heart rate in the 70s. Previous  PFTs  demonstrated extrinsic compression/restrictive physiology.  She reports experiencing intermittent lightheadedness for the past month. Each episode occurs while standing or walking, and not after sudden standing from a seated/laying position. She had one episode while standing in the sun after church walking to the car. She felt lightheadedness and caught herself while  walking to the car. She can reliably reproduce symptoms of lightheadedness and weakness when standing in a hot shower. This abates when cool water is turned on. Each episode improves with rest. She notes prodromal weakness, diaphoresis and nausea. Denies chest pain, palpitations, shortness of breath during these times. No frank syncope. She reports staying well-hydrated.   She reports experiencing increased BP recently (SBP 170s). Logged BP recordings over the past 1-2 weeks range from SBP 130-142/72-86, two episodes 161/76, 174/75.  She continues to wear her CPAP machine. She received this 8 years ago. She now wakes up intermittently throughout the night "choking." She does not get restful sleep. She has had difficulty affording a new CPAP, but continues to use her old one. No tobacco or EtOH abuse. Reports normal thyroid numbers which are checked by Dr. Brigitte Pulse. She continues to take her antihypertensives religiously.   She also notes infrequent episodes of tachy-palpitations (once/week) usually occurring at night. The last episode occurred unprovoked ~ 1 week ago around 11:30 PM lasting for "a few minutes." She had taken Cardizem that morning. She denies increase caffeine use or stress. She felt "a bit depressed" that day and took a Xanax after the palpitations with improvement. No associated symptoms.   She is still complaining of a persistent buzzing in her ear. She does report this is a chronic issue going back many years. Prior workup with ear nose throat. She does have prior exposure to loud noises, worked in a [employer].  Thyroid, cholesterol, blood work, glucose followed by PCP Total cholesterol 155, LDL 84, hemoglobin A1c 6.5 EKG shows atrial paced rhythm rate 66 beats per minute, no significant  ST or T wave changes      Outpatient Encounter Prescriptions as of 07/16/2013  Medication Sig Dispense Refill  . ALPRAZolam (XANAX) 0.5 MG tablet Take 0.5 mg by mouth as needed. 1/2-1 prn      .  atorvastatin (LIPITOR) 20 MG tablet TAKE ONE TABLET BY MOUTH DAILY.  30 tablet  6  . B Complex-C-Min-Fe-FA (HEMATINIC PLUS VIT/MINERALS PO) Take by mouth daily.        . brinzolamide (AZOPT) 1 % ophthalmic suspension Place 1 drop into both eyes 2 (two) times daily.       Marland Kitchen BYSTOLIC 20 MG TABS TAKE 1 TABLET EVERY DAY  30 tablet  6  . diltiazem (CARDIZEM CD) 240 MG 24 hr capsule Take 1 capsule (240 mg total) by mouth daily.  30 capsule  3  . esomeprazole (NEXIUM) 40 MG capsule Take 40 mg by mouth 2 (two) times daily.        Marland Kitchen glipiZIDE (GLUCOTROL) 10 MG tablet Take 10 mg by mouth 2 (two) times daily before a meal.      . metFORMIN (GLUCOPHAGE) 500 MG tablet Take 500 mg by mouth 2 (two) times daily with a meal.      . mometasone (ELOCON) 0.1 % cream Apply topically daily. Apply to ear as directed two times a day for 10 days as needed         . NON FORMULARY c-pap      . olmesartan (BENICAR) 40 MG tablet Take 40 mg by mouth daily.      . Omega-3 Fatty Acids (FISH OIL TRIPLE STRENGTH) 1400 MG CAPS Take by mouth daily.      . travoprost, benzalkonium, (TRAVATAN) 0.004 % ophthalmic solution 1 drop as directed.        . warfarin (COUMADIN) 3 MG tablet 3 mg. Takes on Tues, Thurs, Sat and Sun. Takes 3 and 1/2 tablet on Mon, Wed and Friday.       No facility-administered encounter medications on file as of 07/16/2013.     Review of Systems  Constitutional: Negative.   HENT: Negative.   Eyes: Negative.   Respiratory: Positive for choking.   Cardiovascular: Positive for palpitations.  Gastrointestinal: Negative.   Musculoskeletal: Positive for myalgias, back pain, arthralgias and gait problem.  Skin: Negative.   Neurological: Positive for light-headedness.  Psychiatric/Behavioral: Negative.   All other systems reviewed and are negative.    There were no vitals taken for this visit.  Physical Exam  Nursing note and vitals reviewed. Constitutional: She is oriented to person, place, and time.  She appears well-developed and well-nourished. No distress.  obese  HENT:  Head: Normocephalic and atraumatic.  Nose: Nose normal.  Mouth/Throat: Oropharynx is clear and moist.  Eyes: Conjunctivae are normal. Pupils are equal, round, and reactive to light.  Neck: Normal range of motion. Neck supple. No JVD present.  Cardiovascular: Normal rate, regular rhythm, S1 normal, S2 normal, normal heart sounds and intact distal pulses.  Exam reveals no gallop and no friction rub.   No murmur heard. Pulmonary/Chest: Effort normal and breath sounds normal. No respiratory distress. She has no wheezes. She has no rales. She exhibits no tenderness.  Abdominal: Soft. Bowel sounds are normal. She exhibits no distension. There is no tenderness.  Musculoskeletal: Normal range of motion. She exhibits no edema and no tenderness.  Lymphadenopathy:    She has no cervical adenopathy.  Neurological: She is alert and oriented to person, place, and time. Coordination normal.  Skin:  Skin is warm and dry. No rash noted. No erythema.  Psychiatric: She has a normal mood and affect. Her behavior is normal. Judgment and thought content normal.    Assessment and Plan

## 2013-07-16 NOTE — Telephone Encounter (Signed)
Date:  Time:  BP: 06/26/13 N/A  140/80 06/30/13 N/A  130/72 07/08/13  N/A  142/86 07/12/13  5:30PM 162/76 07/13/13 1:15 PM 136/78

## 2013-07-16 NOTE — Patient Instructions (Addendum)
For lightheadedness, please stay well hydrated and avoid excessive exposure to the heat (i.e. Hot days, hot showers, etc). Please invest in and wear compression stockings. We will help to arrange this.   Regarding blood pressure, please check your blood pressure twice a day, morning and night, and keep a log. We will increase your Bystolic to 40mg  daily. Goal should be < 140/90. We will kindly ask Dr. Brigitte Pulse to help [place] arrange a repeat sleep study with CPAP adjusting if needed to control your sleep apnea more appropriately. Hopefully this will improve the buzzing in your ears.   For atrial fibrillation, these episodes seem fairly rare. Increasing Bystolic may help with this. You may take an additional tablet of Cardizem as needed, especially in the evenings after the morning dose wears off, if you have repeat episodes of heart fluttering. We will plan to interrogate your pacemaker in the near future.   Please keep follow-up appointments with Dr. Rockey Situ next month and Dr. Caryl Comes in October.

## 2013-07-30 ENCOUNTER — Other Ambulatory Visit: Payer: Self-pay | Admitting: Cardiovascular Disease

## 2013-08-08 ENCOUNTER — Encounter: Payer: Self-pay | Admitting: Cardiovascular Disease

## 2013-08-08 ENCOUNTER — Ambulatory Visit (INDEPENDENT_AMBULATORY_CARE_PROVIDER_SITE_OTHER): Payer: Medicare Other | Admitting: Cardiovascular Disease

## 2013-08-08 VITALS — BP 128/84 | HR 69 | Ht 66.0 in | Wt 260.0 lb

## 2013-08-08 DIAGNOSIS — I4891 Unspecified atrial fibrillation: Secondary | ICD-10-CM

## 2013-08-08 DIAGNOSIS — Z950 Presence of cardiac pacemaker: Secondary | ICD-10-CM

## 2013-08-08 DIAGNOSIS — E119 Type 2 diabetes mellitus without complications: Secondary | ICD-10-CM

## 2013-08-08 DIAGNOSIS — I10 Essential (primary) hypertension: Secondary | ICD-10-CM

## 2013-08-08 NOTE — Progress Notes (Signed)
Patient ID: [name] [name], female    DOB: May 16, 1935, 78 y.o.   MRN: [id number]  HPI Comments: [name] [name] is a 78 yo woman with a history of atrial fibrillation, symptomatic palpitations/PVCs, status post pacemaker implantation for sick sinus syndrome/tachybradycardia syndrome with chronic symptoms of palpitations, shortness of breath with exertion, who presents for routine followup.   Previous hx of shortness of breath and palpitations.  May 2012, she had adjustments to her pacemaker and since that time has felt back to normal.   she has had long hx of hot flashes, and a racing heart rate when she is in bed. Recently this has been well-controlled on high-dose bystolic.   chronic back and hip pain. She did receive a cortisone shot on the left with no significant improvement. She uses her CPAP  She reports that her sugars have been well-controlled, blood pressure well controlled on current medications   syncope episode August 2013 .  in her bedroom folding clothes and the next thing she knew she found herself on the floor in the bathroom. She spent 2 days in the hospital, then at least 10 days in rehabilitation for significant left arm bruising and she was on warfarin. Pacemaker was interrogated after the fact showing 85% atrial paced rhythm, no other arrhythmia. Etiology concerning for vasovagal episode, though exact cause is uncertain.  She has chronic buzzing in her ear. She does report this is a chronic issue going back many years. Prior workup with ear nose throat. She does have prior exposure to loud noises, worked in a [employer].   cardiac catheterization in 2004, stress testing,   echo  showed normal systolic function, mild LVH, diastolic relaxation abnormality, mildly elevated right ventricular systolic pressures, mild MR.  treadmill study showing normal blood pressure response to exercise, chronotropic incompetence with peak heart rate of 100 beats per minute, resting heart rate in the  70s. Previous  PFTs  demonstrated extrinsic compression/restrictive physiology.   Total cholesterol 155, LDL 84, hemoglobin A1c 6.5 EKG shows atrial paced rhythm rate 69 beats per minute, no significant ST or T wave changes      Outpatient Encounter Prescriptions as of 08/08/2013  Medication Sig Dispense Refill  . ALPRAZolam (XANAX) 0.5 MG tablet Take 0.5 mg by mouth as needed. 1/2-1 prn      . atorvastatin (LIPITOR) 20 MG tablet TAKE ONE TABLET BY MOUTH DAILY.  30 tablet  6  . B Complex-C-Min-Fe-FA (HEMATINIC PLUS VIT/MINERALS PO) Take by mouth daily.        Marland Kitchen diltiazem (CARDIZEM CD) 240 MG 24 hr capsule Take 1 capsule (240 mg total) by mouth daily.  30 capsule  3  . esomeprazole (NEXIUM) 40 MG capsule Take 40 mg by mouth 2 (two) times daily.        Marland Kitchen glipiZIDE (GLUCOTROL) 10 MG tablet Take 10 mg by mouth 2 (two) times daily before a meal.      . metFORMIN (GLUCOPHAGE) 500 MG tablet Take 500 mg by mouth 2 (two) times daily with a meal.      . mometasone (ELOCON) 0.1 % cream Apply topically daily. Apply to ear as directed two times a day for 10 days as needed         . Multiple Vitamins-Minerals (CENTRUM SILVER PO) Take by mouth daily.      . Nebivolol HCl (BYSTOLIC PO) Take by mouth. Take 40 mg one tablet daily      . NON FORMULARY c-pap      .  olmesartan (BENICAR) 40 MG tablet Take 40 mg by mouth daily.      . Omega-3 Fatty Acids (FISH OIL TRIPLE STRENGTH) 1400 MG CAPS Take by mouth daily.      . travoprost, benzalkonium, (TRAVATAN) 0.004 % ophthalmic solution 1 drop as directed.        . warfarin (COUMADIN) 3 MG tablet 3 mg. Takes on Tues, Thurs, Sat and Sun. Takes 3 and 1/2 tablet on Mon, Wed and Friday.        Review of Systems  Constitutional: Negative.   HENT: Negative.   Eyes: Negative.   Respiratory: Negative.   Cardiovascular: Negative.   Gastrointestinal: Negative.   Musculoskeletal: Positive for myalgias, back pain, arthralgias and gait problem.  Skin: Negative.    Neurological: Negative.   Psychiatric/Behavioral: Negative.   All other systems reviewed and are negative.    BP 128/84  Pulse 69  Ht 5\' 6"  (1.676 m)  Wt 260 lb (117.935 kg)  BMI 41.99 kg/m2  Physical Exam  Nursing note and vitals reviewed. Constitutional: She is oriented to person, place, and time. She appears well-developed and well-nourished.  obese  HENT:  Head: Normocephalic.  Nose: Nose normal.  Mouth/Throat: Oropharynx is clear and moist.  Eyes: Conjunctivae are normal. Pupils are equal, round, and reactive to light.  Neck: Normal range of motion. Neck supple. No JVD present.  Cardiovascular: Normal rate, regular rhythm, S1 normal, S2 normal, normal heart sounds and intact distal pulses.  Exam reveals no gallop and no friction rub.   No murmur heard. Pulmonary/Chest: Effort normal and breath sounds normal. No respiratory distress. She has no wheezes. She has no rales. She exhibits no tenderness.  Abdominal: Soft. Bowel sounds are normal. She exhibits no distension. There is no tenderness.  Musculoskeletal: Normal range of motion. She exhibits no edema and no tenderness.  Lymphadenopathy:    She has no cervical adenopathy.  Neurological: She is alert and oriented to person, place, and time. Coordination normal.  Skin: Skin is warm and dry. No rash noted. No erythema.  Psychiatric: She has a normal mood and affect. Her behavior is normal. Judgment and thought content normal.    Assessment and Plan

## 2013-08-08 NOTE — Assessment & Plan Note (Signed)
Blood pressure is well controlled on today's visit. No changes made to the medications. 

## 2013-08-08 NOTE — Assessment & Plan Note (Signed)
Currently on warfarin. No complaints of tachycardia or palpitations. Seems to be doing better on high-dose bystolic.

## 2013-08-08 NOTE — Assessment & Plan Note (Signed)
Followed by Dr. Caryl Comes. Currently feeling well

## 2013-08-08 NOTE — Assessment & Plan Note (Signed)
We have encouraged continued exercise, careful diet management in an effort to lose weight. 

## 2013-08-08 NOTE — Patient Instructions (Addendum)
You are doing well. No medication changes were made.  Please call us if you have new issues that need to be addressed before your next appt.  Your physician wants you to follow-up in: 6 months.  You will receive a reminder letter in the mail two months in advance. If you don't receive a letter, please call our office to schedule the follow-up appointment.   

## 2013-08-12 ENCOUNTER — Ambulatory Visit: Payer: Self-pay | Admitting: Family Medicine

## 2013-08-18 ENCOUNTER — Other Ambulatory Visit: Payer: Self-pay

## 2013-08-19 MED ORDER — DILTIAZEM HCL ER COATED BEADS 240 MG PO CP24: 240.0000 mg | ORAL_CAPSULE | Freq: Every day | ORAL | Status: DC

## 2013-09-04 ENCOUNTER — Ambulatory Visit (INDEPENDENT_AMBULATORY_CARE_PROVIDER_SITE_OTHER): Payer: Medicare Other | Admitting: Internal Medicine

## 2013-09-04 VITALS — BP 132/72 | HR 66 | Ht 66.0 in | Wt 257.4 lb

## 2013-09-04 DIAGNOSIS — I495 Sick sinus syndrome: Secondary | ICD-10-CM

## 2013-09-04 DIAGNOSIS — I4891 Unspecified atrial fibrillation: Secondary | ICD-10-CM

## 2013-09-04 DIAGNOSIS — R55 Syncope and collapse: Secondary | ICD-10-CM

## 2013-09-04 DIAGNOSIS — Z950 Presence of cardiac pacemaker: Secondary | ICD-10-CM

## 2013-09-04 LAB — PACEMAKER DEVICE OBSERVATION
AL AMPLITUDE: 1.767 mv
AL IMPEDENCE PM: 392 Ohm
AL THRESHOLD: 0.5 V
ATRIAL PACING PM: 96.84
BAMS-0001: 135 {beats}/min
BATTERY VOLTAGE: 2.92 V
RV LEAD AMPLITUDE: 6.5666 mv
RV LEAD IMPEDENCE PM: 424 Ohm
RV LEAD THRESHOLD: 0.5 V
VENTRICULAR PACING PM: 4.28

## 2013-09-04 NOTE — Assessment & Plan Note (Signed)
The patient's device was interrogated.  The information was reviewed. No changes were made in the programming.    

## 2013-09-04 NOTE — Assessment & Plan Note (Signed)
Infrequent recurrent atrial fibrillation on warfarin

## 2013-09-04 NOTE — Assessment & Plan Note (Signed)
Stable with nearly 100% atrial pacing

## 2013-09-04 NOTE — Patient Instructions (Addendum)
Remote monitoring is used to monitor your Pacemaker of ICD from home. This monitoring reduces the number of office visits required to check your device to one time per year. It allows [place] to keep an eye on the functioning of your device to ensure it is working properly. You are scheduled for a device check from home on 12/08/2013. You may send your transmission at any time that day. If you have a wireless device, the transmission will be sent automatically. After your physician reviews your transmission, you will receive a postcard with your next transmission date.  Your physician wants you to follow-up in: 1 year with Dr. Caryl Comes. You will receive a reminder letter in the mail two months in advance. If you don't receive a letter, please call our office to schedule the follow-up appointment.  Your physician recommends that you continue on your current medications as directed. Please refer to the Current Medication list given to you today.

## 2013-09-04 NOTE — Assessment & Plan Note (Signed)
No recurrent syncope

## 2013-09-04 NOTE — Progress Notes (Signed)
Patient Care Team: Keith Rake as PCP - General (Family Medicine)   HPI  [name] [name] is a 78 y.o. female Seen in followup for pacemaker implantation for sinus node dysfunction and chronotropic incompetence. She also has a history of atrial fibrillation and PVCs. She has hx of syncope   cardiac catheterization in 2004, stress testing,  echo showed normal systolic function, mild LVH, diastolic relaxation abnormality, mildly elevated right ventricular systolic pressures, mild MR.  She has been feeling better since her blood pressures been under good control of  Past Medical History  Diagnosis Date  . Sinoatrial node dysfunction   . Atrial fibrillation   . Unspecified essential hypertension   . Cardiac pacemaker in situ   . Esophageal reflux   . Unspecified glaucoma(365.9)   . Type II or unspecified type diabetes mellitus without mention of complication, not stated as uncontrolled   . Unspecified glaucoma(365.9)   . Type II or unspecified type diabetes mellitus without mention of complication, not stated as uncontrolled   . Obstructive sleep apnea   . Obese   . Palpitations   . History of UTI   . Syncope and collapse   . History of syncope   . Hyperlipidemia     Past Surgical History  Procedure Laterality Date  . Hysterectomy (other)  1970  . Cyst on right leg  03/1998  . Pacemaker insertion  2008    Current Outpatient Prescriptions  Medication Sig Dispense Refill  . ALPRAZolam (XANAX) 0.5 MG tablet Take 0.5 mg by mouth as needed. 1/2-1 prn      . atorvastatin (LIPITOR) 20 MG tablet TAKE ONE TABLET BY MOUTH DAILY.  30 tablet  6  . B Complex-C-Min-Fe-FA (HEMATINIC PLUS VIT/MINERALS PO) Take by mouth daily.        Marland Kitchen diltiazem (CARDIZEM CD) 240 MG 24 hr capsule Take 1 capsule (240 mg total) by mouth daily.  30 capsule  6  . esomeprazole (NEXIUM) 40 MG capsule Take 40 mg by mouth 2 (two) times daily.        Marland Kitchen glipiZIDE (GLUCOTROL) 10 MG tablet Take 10 mg by mouth 2 (two) times  daily before a meal.      . metFORMIN (GLUCOPHAGE) 500 MG tablet Take 500 mg by mouth 2 (two) times daily with a meal.      . mometasone (ELOCON) 0.1 % cream Apply topically daily. Apply to ear as directed two times a day for 10 days as needed         . Multiple Vitamins-Minerals (CENTRUM SILVER PO) Take by mouth daily.      . Nebivolol HCl (BYSTOLIC PO) Take by mouth. Take 40 mg one tablet daily      . NON FORMULARY c-pap      . olmesartan (BENICAR) 40 MG tablet Take 40 mg by mouth daily.      . Omega-3 Fatty Acids (FISH OIL TRIPLE STRENGTH) 1400 MG CAPS Take by mouth daily.      . travoprost, benzalkonium, (TRAVATAN) 0.004 % ophthalmic solution 1 drop as directed.        . warfarin (COUMADIN) 3 MG tablet 3 mg. Takes on Tues, Thurs, Sat and Sun. Takes 3 and 1/2 tablet on Mon, Wed and Friday.       No current facility-administered medications for this visit.    No Known Allergies  Review of Systems negative except from HPI and PMH  Physical Exam Well developed and nourished in no acute distress HENT normal Neck  supple with JVP-flat Clear Device pocket well healed; without hematoma or erythema.  There is no tethering  Regular rate and rhythm, +s4s Abd-soft with active BS No Clubbing cyanosis edema Skin-warm and dry A & Oriented  Grossly normal sensory and motor function     Assessment and  Plan

## 2013-09-10 ENCOUNTER — Telehealth: Payer: Self-pay | Admitting: *Deleted

## 2013-09-10 NOTE — Telephone Encounter (Signed)
Patient needs 40mg 

## 2013-09-10 NOTE — Telephone Encounter (Signed)
LMTCB to confirm which medication would like to have refilled/pharmacy.

## 2013-09-11 ENCOUNTER — Telehealth: Payer: Self-pay | Admitting: *Deleted

## 2013-09-11 MED ORDER — NEBIVOLOL HCL 20 MG PO TABS: 20.0000 mg | ORAL_TABLET | Freq: Two times a day (BID) | ORAL | Status: DC

## 2013-09-11 NOTE — Telephone Encounter (Signed)
Refilled Bystolic 20 mg sent to [phone].

## 2013-09-11 NOTE — Telephone Encounter (Signed)
Please make sure she is taking 40mg  PO daily instead of 20mg  PO twice daily. Thanks.

## 2013-09-12 NOTE — Telephone Encounter (Signed)
Refill sent to [facility] for [place].

## 2013-09-23 ENCOUNTER — Inpatient Hospital Stay: Payer: Self-pay | Admitting: Internal Medicine

## 2013-09-23 LAB — CBC
HCT: 34.3 % — ABNORMAL LOW (ref 35.0–47.0)
HGB: 11.4 g/dL — ABNORMAL LOW (ref 12.0–16.0)
MCH: 23.4 pg — ABNORMAL LOW (ref 26.0–34.0)
MCHC: 33.2 g/dL (ref 32.0–36.0)
MCV: 71 fL — ABNORMAL LOW (ref 80–100)
Platelet: 240 10*3/uL (ref 150–440)
RBC: 4.87 10*6/uL (ref 3.80–5.20)
RDW: 16.7 % — ABNORMAL HIGH (ref 11.5–14.5)
WBC: 8.8 10*3/uL (ref 3.6–11.0)

## 2013-09-23 LAB — BASIC METABOLIC PANEL
Anion Gap: 6 — ABNORMAL LOW (ref 7–16)
BUN: 13 mg/dL (ref 7–18)
Calcium, Total: 9.7 mg/dL (ref 8.5–10.1)
Chloride: 105 mmol/L (ref 98–107)
Co2: 27 mmol/L (ref 21–32)
Creatinine: 1.15 mg/dL (ref 0.60–1.30)
EGFR (African American): 54 — ABNORMAL LOW
EGFR (Non-African Amer.): 47 — ABNORMAL LOW
Glucose: 232 mg/dL — ABNORMAL HIGH (ref 65–99)
Osmolality: 283 (ref 275–301)
Potassium: 4 mmol/L (ref 3.5–5.1)
Sodium: 138 mmol/L (ref 136–145)

## 2013-09-23 LAB — PROTIME-INR
INR: 2.2
Prothrombin Time: 23.9 secs — ABNORMAL HIGH (ref 11.5–14.7)

## 2013-09-23 LAB — TROPONIN I: Troponin-I: <0.02 ng/mL

## 2013-09-23 LAB — PRO B NATRIURETIC PEPTIDE: B-Type Natriuretic Peptide: 2012 pg/mL — ABNORMAL HIGH (ref 0–450)

## 2013-09-23 LAB — HEMOGLOBIN A1C: Hemoglobin A1C: 7.7 % — ABNORMAL HIGH (ref 4.2–6.3)

## 2013-09-24 DIAGNOSIS — I10 Essential (primary) hypertension: Secondary | ICD-10-CM

## 2013-09-24 DIAGNOSIS — I5033 Acute on chronic diastolic (congestive) heart failure: Secondary | ICD-10-CM

## 2013-09-24 DIAGNOSIS — I059 Rheumatic mitral valve disease, unspecified: Secondary | ICD-10-CM

## 2013-09-24 DIAGNOSIS — I4891 Unspecified atrial fibrillation: Secondary | ICD-10-CM

## 2013-09-24 LAB — CBC WITH DIFFERENTIAL/PLATELET
Basophil #: 0 10*3/uL (ref 0.0–0.1)
Basophil %: 0.4 %
Eosinophil #: 0.2 10*3/uL (ref 0.0–0.7)
Eosinophil %: 2.3 %
HCT: 34 % — ABNORMAL LOW (ref 35.0–47.0)
HGB: 11.7 g/dL — ABNORMAL LOW (ref 12.0–16.0)
Lymphocyte #: 2.2 10*3/uL (ref 1.0–3.6)
Lymphocyte %: 23.6 %
MCH: 24.1 pg — ABNORMAL LOW (ref 26.0–34.0)
MCHC: 34.5 g/dL (ref 32.0–36.0)
MCV: 70 fL — ABNORMAL LOW (ref 80–100)
Monocyte #: 0.7 x10 3/mm (ref 0.2–0.9)
Monocyte %: 7.6 %
Neutrophil #: 6.2 10*3/uL (ref 1.4–6.5)
Neutrophil %: 66.1 %
Platelet: 229 10*3/uL (ref 150–440)
RBC: 4.87 10*6/uL (ref 3.80–5.20)
RDW: 16.5 % — ABNORMAL HIGH (ref 11.5–14.5)
WBC: 9.4 10*3/uL (ref 3.6–11.0)

## 2013-09-24 LAB — BASIC METABOLIC PANEL
Anion Gap: 6 — ABNORMAL LOW (ref 7–16)
BUN: 13 mg/dL (ref 7–18)
Calcium, Total: 9.2 mg/dL (ref 8.5–10.1)
Chloride: 105 mmol/L (ref 98–107)
Co2: 29 mmol/L (ref 21–32)
Creatinine: 1.29 mg/dL (ref 0.60–1.30)
EGFR (African American): 47 — ABNORMAL LOW
EGFR (Non-African Amer.): 40 — ABNORMAL LOW
Glucose: 174 mg/dL — ABNORMAL HIGH (ref 65–99)
Osmolality: 284 (ref 275–301)
Potassium: 3.4 mmol/L — ABNORMAL LOW (ref 3.5–5.1)
Sodium: 140 mmol/L (ref 136–145)

## 2013-09-25 DIAGNOSIS — I5033 Acute on chronic diastolic (congestive) heart failure: Secondary | ICD-10-CM

## 2013-09-25 DIAGNOSIS — I4891 Unspecified atrial fibrillation: Secondary | ICD-10-CM

## 2013-09-25 DIAGNOSIS — I495 Sick sinus syndrome: Secondary | ICD-10-CM

## 2013-09-25 DIAGNOSIS — I10 Essential (primary) hypertension: Secondary | ICD-10-CM

## 2013-09-25 LAB — BASIC METABOLIC PANEL
Anion Gap: 7 (ref 7–16)
BUN: 24 mg/dL — ABNORMAL HIGH (ref 7–18)
Calcium, Total: 9.1 mg/dL (ref 8.5–10.1)
Chloride: 104 mmol/L (ref 98–107)
Co2: 28 mmol/L (ref 21–32)
Creatinine: 1.56 mg/dL — ABNORMAL HIGH (ref 0.60–1.30)
EGFR (African American): 37 — ABNORMAL LOW
EGFR (Non-African Amer.): 32 — ABNORMAL LOW
Glucose: 187 mg/dL — ABNORMAL HIGH (ref 65–99)
Osmolality: 287 (ref 275–301)
Potassium: 3.3 mmol/L — ABNORMAL LOW (ref 3.5–5.1)
Sodium: 139 mmol/L (ref 136–145)

## 2013-09-25 LAB — PROTIME-INR
INR: 2.2
Prothrombin Time: 23.7 secs — ABNORMAL HIGH (ref 11.5–14.7)

## 2013-09-25 LAB — TROPONIN I: Troponin-I: <0.02 ng/mL

## 2013-09-26 DIAGNOSIS — I4891 Unspecified atrial fibrillation: Secondary | ICD-10-CM

## 2013-09-26 DIAGNOSIS — I5033 Acute on chronic diastolic (congestive) heart failure: Secondary | ICD-10-CM

## 2013-09-26 DIAGNOSIS — I10 Essential (primary) hypertension: Secondary | ICD-10-CM

## 2013-10-15 ENCOUNTER — Telehealth: Payer: Self-pay

## 2013-10-15 NOTE — Telephone Encounter (Signed)
Patient contacted regarding discharge from [facility] on 09/26/13.  Patient understands to follow up with provider Dr. Rockey Situ on 10/17/13 at 9:45 at [facility]. Patient understands discharge instructions? yes Patient understands medications and regiment? yes Patient understands to bring all medications to this visit? yes

## 2013-10-16 ENCOUNTER — Telehealth: Payer: Self-pay | Admitting: Cardiovascular Disease

## 2013-10-16 NOTE — Telephone Encounter (Signed)
I spoke with [facility], RN). She had called in to report the patient has gained from 245 lbs up to 249 lbs since around 09/27/13. Her urine output is down. She complains of fullness in her abdomen and SOB with activity. I called and spoke with the patient and she confirmed this. She states she can lay flat and wear her C-PAP, but that she cannot sit in her small recliner and recline back without feeling like something is pressing up against her chest. She is not SOB over the phone. She already has an appointment with Dr. Rockey Situ tomorrow at 9:45am. I advised her to bring her current medication list and daily weights with her to the appointment. She is agreeable.

## 2013-10-17 ENCOUNTER — Ambulatory Visit (INDEPENDENT_AMBULATORY_CARE_PROVIDER_SITE_OTHER): Payer: Medicare Other | Admitting: Cardiovascular Disease

## 2013-10-17 ENCOUNTER — Encounter: Payer: Self-pay | Admitting: Cardiovascular Disease

## 2013-10-17 VITALS — BP 112/68 | HR 65 | Ht 66.0 in | Wt 254.5 lb

## 2013-10-17 DIAGNOSIS — I509 Heart failure, unspecified: Secondary | ICD-10-CM

## 2013-10-17 DIAGNOSIS — I10 Essential (primary) hypertension: Secondary | ICD-10-CM

## 2013-10-17 DIAGNOSIS — G4733 Obstructive sleep apnea (adult) (pediatric): Secondary | ICD-10-CM

## 2013-10-17 DIAGNOSIS — I5033 Acute on chronic diastolic (congestive) heart failure: Secondary | ICD-10-CM

## 2013-10-17 DIAGNOSIS — Z950 Presence of cardiac pacemaker: Secondary | ICD-10-CM

## 2013-10-17 DIAGNOSIS — R609 Edema, unspecified: Secondary | ICD-10-CM

## 2013-10-17 DIAGNOSIS — I4891 Unspecified atrial fibrillation: Secondary | ICD-10-CM

## 2013-10-17 DIAGNOSIS — R5381 Other malaise: Secondary | ICD-10-CM

## 2013-10-17 DIAGNOSIS — R0602 Shortness of breath: Secondary | ICD-10-CM

## 2013-10-17 DIAGNOSIS — R5383 Other fatigue: Secondary | ICD-10-CM

## 2013-10-17 DIAGNOSIS — E119 Type 2 diabetes mellitus without complications: Secondary | ICD-10-CM

## 2013-10-17 NOTE — Patient Instructions (Signed)
You are dehydrated today Hold the lasix this weekend Drink more a day Only take lasix as needed for shortness of breath, fluid retention, been drinking too much No more than lasix every other day  Please call us if you have new issues that need to be addressed before your next appt.  Your physician wants you to follow-up in: 3 months.  You will receive a reminder letter in the mail two months in advance. If you don't receive a letter, please call our office to schedule the follow-up appointment.

## 2013-10-17 NOTE — Assessment & Plan Note (Signed)
She has been very strict with her fluid intake, taking Lasix daily at the time of discharge. She is getting dehydrated, creatinine is climbing, mouth is dry, blood pressure is low, she has general malaise. We have suggested she hold her Lasix particularly through the weekend, drink or fluids, only takes Lasix no more than every other day, and perhaps take for only symptoms of shortness of breath

## 2013-10-17 NOTE — Assessment & Plan Note (Signed)
Low blood pressure today likely from dehydration. We'll continue her current medications

## 2013-10-17 NOTE — Assessment & Plan Note (Signed)
Followed by Dr. Klein 

## 2013-10-17 NOTE — Assessment & Plan Note (Signed)
No significant edema on today's visit. Some mild component of venous insufficiency

## 2013-10-17 NOTE — Assessment & Plan Note (Signed)
We have encouraged continued exercise, careful diet management in an effort to lose weight. 

## 2013-10-17 NOTE — Progress Notes (Signed)
Patient ID: [name] [name], female    DOB: 07-18-1935, 78 y.o.   MRN: [id number]  HPI Comments: [name] [name] is a 78 yo woman with a history of atrial fibrillation, symptomatic palpitations/PVCs, status post pacemaker implantation for sick sinus syndrome/tachybradycardia syndrome with chronic symptoms of palpitations, shortness of breath with exertion, who presents for routine followup.  She had a recent hospital admission on 09/24/2013 for acute on chronic diastolic CHF. She was drinking a significant amount of fluids. She was treated with IV diuretics with improvement of her symptoms. Was recommended that she go home with Lasix every other day. She had a stress test while in the hospital showing no ischemia. Echocardiogram dated 09/24/2013 showing ejection fraction 50-55%,, mild to moderate tricuspid regurg, mild to moderate MR, normal RV function, normal right ventricular systolic pressure  In  presentation today, she reports that she has been very strict with her fluid intake. She has been taking Lasix daily.Her biggest complaint is fatigue  Creatinine has climbed from 1.13 up to 1.6, BUN up from 14-19. She has general malaise the past several days, low blood pressure   Previous hx of shortness of breath and palpitations.  May 2012, she had adjustments to her pacemaker and since that time has felt back to normal.   she has had long hx of hot flashes, and a racing heart rate when she is in bed. Recently this has been well-controlled on high-dose bystolic.   chronic back and hip pain. She did receive a cortisone shot on the left with no significant improvement. She uses her CPAP  She reports that her sugars have been well-controlled, blood pressure well controlled on current medications   syncope episode August 2013 .  in her bedroom folding clothes and the next thing she knew she found herself on the floor in the bathroom. She spent 2 days in the hospital, then at least 10 days in rehabilitation for  significant left arm bruising and she was on warfarin. Pacemaker was interrogated after the fact showing 85% atrial paced rhythm, no other arrhythmia. Etiology concerning for vasovagal episode, though exact cause is uncertain.  She has chronic buzzing in her ear. She does report this is a chronic issue going back many years. Prior workup with ear nose throat. She does have prior exposure to loud noises, worked in a [employer].   cardiac catheterization in 2004, stress testing,   echo  showed normal systolic function, mild LVH, diastolic relaxation abnormality, mildly elevated right ventricular systolic pressures, mild MR.  treadmill study showing normal blood pressure response to exercise, chronotropic incompetence with peak heart rate of 100 beats per minute, resting heart rate in the 70s. Previous  PFTs  demonstrated extrinsic compression/restrictive physiology.   Total cholesterol 155, LDL 84, hemoglobin A1c 6.5 EKG shows paced rhythm rate 65 beats per minute, no significant ST or T wave changes      Outpatient Encounter Prescriptions as of 10/17/2013  Medication Sig  . ALPRAZolam (XANAX) 0.5 MG tablet Take 0.5 mg by mouth as needed. 1/2-1 prn  . atorvastatin (LIPITOR) 20 MG tablet TAKE ONE TABLET BY MOUTH DAILY.  Marland Kitchen diltiazem (CARDIZEM CD) 240 MG 24 hr capsule Take 1 capsule (240 mg total) by mouth daily.  Marland Kitchen esomeprazole (NEXIUM) 40 MG capsule Take 40 mg by mouth 2 (two) times daily.    . furosemide (LASIX) 20 MG tablet Take 20 mg by mouth 2 (two) times daily.   Marland Kitchen glipiZIDE (GLUCOTROL) 10 MG tablet Take 10  mg by mouth 2 (two) times daily before a meal.  . metFORMIN (GLUCOPHAGE) 500 MG tablet Take 500 mg by mouth 2 (two) times daily with a meal.  . mometasone (ELOCON) 0.1 % cream Apply topically daily. Apply to ear as directed two times a day for 10 days as needed     . Multiple Vitamins-Minerals (CENTRUM SILVER PO) Take by mouth daily.  . Nebivolol HCl (BYSTOLIC) 20 MG TABS Take 1  tablet (20 mg total) by mouth 2 (two) times daily.  . NON FORMULARY c-pap  . olmesartan (BENICAR) 40 MG tablet Take 40 mg by mouth daily.  . Omega-3 Fatty Acids (FISH OIL) 1200 MG CAPS Take 1,200 mg by mouth daily.  Marland Kitchen SIMBRINZA 1-0.2 % SUSP Place 2 drops into both eyes 2 (two) times daily.   . travoprost, benzalkonium, (TRAVATAN) 0.004 % ophthalmic solution 1 drop as directed.    . warfarin (COUMADIN) 3 MG tablet 3 mg. Takes on Tues, Thurs, Sat and Sun. Takes 3 and 1/2 tablet on Mon, Wed and Friday.  . [DISCONTINUED] Omega-3 Fatty Acids (FISH OIL TRIPLE STRENGTH) 1400 MG CAPS Take by mouth daily.     Review of Systems  Constitutional: Positive for fatigue.  HENT: Negative.   Eyes: Negative.   Respiratory: Negative.   Cardiovascular: Negative.   Gastrointestinal: Negative.   Musculoskeletal: Positive for arthralgias, back pain, gait problem and myalgias.  Skin: Negative.   Neurological: Negative.   Psychiatric/Behavioral: Negative.   All other systems reviewed and are negative.    BP 112/68  Pulse 65  Ht 5\' 6"  (1.676 m)  Wt 254 lb 8 oz (115.44 kg)  BMI 41.10 kg/m2  Physical Exam  Nursing note and vitals reviewed. Constitutional: She is oriented to person, place, and time. She appears well-developed and well-nourished.  obese  HENT:  Head: Normocephalic.  Nose: Nose normal.  Mouth/Throat: Oropharynx is clear and moist.  Eyes: Conjunctivae are normal. Pupils are equal, round, and reactive to light.  Neck: Normal range of motion. Neck supple. No JVD present.  Cardiovascular: Normal rate, regular rhythm, S1 normal, S2 normal, normal heart sounds and intact distal pulses.  Exam reveals no gallop and no friction rub.   No murmur heard. Pulmonary/Chest: Effort normal and breath sounds normal. No respiratory distress. She has no wheezes. She has no rales. She exhibits no tenderness.  Abdominal: Soft. Bowel sounds are normal. She exhibits no distension. There is no tenderness.   Musculoskeletal: Normal range of motion. She exhibits no edema and no tenderness.  Lymphadenopathy:    She has no cervical adenopathy.  Neurological: She is alert and oriented to person, place, and time. Coordination normal.  Skin: Skin is warm and dry. No rash noted. No erythema.  Psychiatric: She has a normal mood and affect. Her behavior is normal. Judgment and thought content normal.    Assessment and Plan

## 2013-10-17 NOTE — Assessment & Plan Note (Signed)
On warfarin. Relatively asymptomatic. Paced rhythm, unable to tell if she has chronic underlying atrial fibrillation

## 2013-10-17 NOTE — Assessment & Plan Note (Signed)
Recent sleep study. She currently wears CPAP. Unable to afford a new machine

## 2013-10-18 ENCOUNTER — Emergency Department: Payer: Self-pay | Admitting: Emergency Medicine

## 2013-10-18 ENCOUNTER — Telehealth: Payer: Self-pay | Admitting: Physician Assistant

## 2013-10-18 LAB — CBC
HCT: 33.3 % — ABNORMAL LOW (ref 35.0–47.0)
HGB: 11 g/dL — ABNORMAL LOW (ref 12.0–16.0)
MCH: 23.8 pg — ABNORMAL LOW (ref 26.0–34.0)
MCHC: 33.1 g/dL (ref 32.0–36.0)
MCV: 72 fL — ABNORMAL LOW (ref 80–100)
Platelet: 224 10*3/uL (ref 150–440)
RBC: 4.63 10*6/uL (ref 3.80–5.20)
RDW: 17.4 % — ABNORMAL HIGH (ref 11.5–14.5)
WBC: 8.7 10*3/uL (ref 3.6–11.0)

## 2013-10-18 LAB — BASIC METABOLIC PANEL
Anion Gap: 6 — ABNORMAL LOW (ref 7–16)
BUN: 22 mg/dL — ABNORMAL HIGH (ref 7–18)
Calcium, Total: 9 mg/dL (ref 8.5–10.1)
Chloride: 109 mmol/L — ABNORMAL HIGH (ref 98–107)
Co2: 27 mmol/L (ref 21–32)
Creatinine: 1.88 mg/dL — ABNORMAL HIGH (ref 0.60–1.30)
EGFR (African American): 30 — ABNORMAL LOW
EGFR (Non-African Amer.): 26 — ABNORMAL LOW
Glucose: 127 mg/dL — ABNORMAL HIGH (ref 65–99)
Osmolality: 288 (ref 275–301)
Potassium: 4 mmol/L (ref 3.5–5.1)
Sodium: 142 mmol/L (ref 136–145)

## 2013-10-18 LAB — PRO B NATRIURETIC PEPTIDE: B-Type Natriuretic Peptide: 5719 pg/mL — ABNORMAL HIGH (ref 0–450)

## 2013-10-18 LAB — TROPONIN I: Troponin-I: <0.02 ng/mL

## 2013-10-18 LAB — PROTIME-INR
INR: 1.8
Prothrombin Time: 20.9 secs — ABNORMAL HIGH (ref 11.5–14.7)

## 2013-10-18 NOTE — Telephone Encounter (Signed)
Patient with a hx of diastolic CHF and AFib.  She was just admitted to the hospital with a/c diastolic CHF.  She was seen in f/u yesterday.  She was felt to be dehydrated and her diuretics were held and she was told to push her fluids. She calls in feeling like her fluid is worse today. She is more short of breath today and describes Class III symptoms.  Now she feels like her abdomen is swollen and feels swollen up in her throat.   No chest pain. No syncope. She has some mild edema. Weight is up 1/2 lb today. But, she feels like she is back in CHF. Given recent changes in volume status and worsening symptoms today, I have suggested she go to the ED at [facility] for evaluation. She agrees to this plan. Signed, Richardson Dopp, PA-C   10/18/2013 3:08 PM

## 2013-10-20 NOTE — Telephone Encounter (Signed)
Can we set up a followup in the office to discuss her fluid status

## 2013-10-21 ENCOUNTER — Ambulatory Visit (INDEPENDENT_AMBULATORY_CARE_PROVIDER_SITE_OTHER): Payer: Medicare Other | Admitting: Cardiovascular Disease

## 2013-10-21 ENCOUNTER — Encounter: Payer: Self-pay | Admitting: Cardiovascular Disease

## 2013-10-21 VITALS — BP 112/84 | HR 65 | Ht 66.0 in | Wt 253.8 lb

## 2013-10-21 DIAGNOSIS — Z950 Presence of cardiac pacemaker: Secondary | ICD-10-CM

## 2013-10-21 DIAGNOSIS — I509 Heart failure, unspecified: Secondary | ICD-10-CM

## 2013-10-21 DIAGNOSIS — I10 Essential (primary) hypertension: Secondary | ICD-10-CM

## 2013-10-21 DIAGNOSIS — R0602 Shortness of breath: Secondary | ICD-10-CM

## 2013-10-21 DIAGNOSIS — I5033 Acute on chronic diastolic (congestive) heart failure: Secondary | ICD-10-CM

## 2013-10-21 NOTE — Assessment & Plan Note (Signed)
Blood pressure is well controlled on today's visit. No changes made to the medications. 

## 2013-10-21 NOTE — Patient Instructions (Signed)
  No medication changes were made.  There has been a change on your pacer concerning for a rhythm change This might explain your shortness of breath. We will contact Dr. Caryl Comes to evaluate your pacer  Please use lasix sparingly  Please call us if you have new issues that need to be addressed before your next appt.  Your physician wants you to follow-up in: 1 month.

## 2013-10-21 NOTE — Assessment & Plan Note (Signed)
Pacer at EOL, suspect now VVI, causing increased SOB, fatigue. She has been taking more lasix for SOB causing frenal failure. She will see Dr. Caryl Comes on Friday, suggested she decrease her lasix use, use sparingly

## 2013-10-21 NOTE — Progress Notes (Signed)
Patient ID: [name], female    DOB: Apr 12, 1935, 78 y.o.   MRN: [id number]  HPI Comments: [name] [name] is a 78 yo woman with a history of atrial fibrillation, symptomatic palpitations/PVCs, status post pacemaker implantation for sick sinus syndrome/tachybradycardia syndrome with chronic symptoms of palpitations, shortness of breath with exertion, who presents for routine followup.  She had a recent hospital admission on 09/24/2013 for acute on chronic diastolic CHF. She was drinking a significant amount of fluids. She was treated with IV diuretics with improvement of her symptoms. Was recommended that she go home with Lasix every other day. She had a stress test while in the hospital showing no ischemia. Echocardiogram dated 09/24/2013 showing ejection fraction 50-55%,, mild to moderate tricuspid regurg, mild to moderate MR, normal RV function, normal right ventricular systolic pressure  In  presentation today, Her biggest complaint is fatigue . She has been taking increasing lasix for worsening SOB.Creatinien has been climbing.  Creatinine baseline usually 1.1, more recently has climbed from 1.13 up to 1.6, now 1.88. BUN up from 14-19, now 22 She went to the ER again on 10/18/2013.   Previous hx of shortness of breath and palpitations.  May 2012, she had adjustments to her pacemaker and since that time has felt back to normal.   she has had long hx of hot flashes, and a racing heart rate when she is in bed. Recently this has been well-controlled on high-dose bystolic.   chronic back and hip pain. She did receive a cortisone shot on the left with no significant improvement. She uses her CPAP  She reports that her sugars have been well-controlled, blood pressure well controlled on current medications   syncope episode August 2013 .  in her bedroom folding clothes and the next thing she knew she found herself on the floor in the bathroom. She spent 2 days in the hospital, then at least 10 days in  rehabilitation for significant left arm bruising and she was on warfarin. Pacemaker was interrogated after the fact showing 85% atrial paced rhythm, no other arrhythmia. Etiology concerning for vasovagal episode, though exact cause is uncertain.  She has chronic buzzing in her ear. She does report this is a chronic issue going back many years. Prior workup with ear nose throat. She does have prior exposure to loud noises, worked in a [employer].   cardiac catheterization in 2004, stress testing,   echo  showed normal systolic function, mild LVH, diastolic relaxation abnormality, mildly elevated right ventricular systolic pressures, mild MR.  treadmill study showing normal blood pressure response to exercise, chronotropic incompetence with peak heart rate of 100 beats per minute, resting heart rate in the 70s. Previous  PFTs  demonstrated extrinsic compression/restrictive physiology.   Total cholesterol 155, LDL 84, hemoglobin A1c 6.5 EKG shows V paced rhythm rate 65 beats per minute, no significant ST or T wave changes V paced rhythm is new compared to 08/2013      Outpatient Encounter Prescriptions as of 10/21/2013  Medication Sig  . ALPRAZolam (XANAX) 0.5 MG tablet Take 0.5 mg by mouth as needed. 1/2-1 prn  . atorvastatin (LIPITOR) 20 MG tablet TAKE ONE TABLET BY MOUTH DAILY.  Marland Kitchen diltiazem (CARDIZEM CD) 240 MG 24 hr capsule Take 1 capsule (240 mg total) by mouth daily.  Marland Kitchen esomeprazole (NEXIUM) 40 MG capsule Take 40 mg by mouth 2 (two) times daily.    . furosemide (LASIX) 20 MG tablet Take 20 mg by mouth daily.   Marland Kitchen  glipiZIDE (GLUCOTROL) 10 MG tablet Take 10 mg by mouth 2 (two) times daily before a meal.  . metFORMIN (GLUCOPHAGE) 500 MG tablet Take 500 mg by mouth 2 (two) times daily with a meal.  . mometasone (ELOCON) 0.1 % cream Apply topically daily. Apply to ear as directed two times a day for 10 days as needed     . Multiple Vitamins-Minerals (CENTRUM SILVER PO) Take by mouth daily.   . Nebivolol HCl (BYSTOLIC) 20 MG TABS Take 1 tablet (20 mg total) by mouth 2 (two) times daily.  . NON FORMULARY c-pap  . olmesartan (BENICAR) 40 MG tablet Take 40 mg by mouth daily.  . Omega-3 Fatty Acids (FISH OIL) 1200 MG CAPS Take 1,200 mg by mouth daily.  Marland Kitchen SIMBRINZA 1-0.2 % SUSP Place 2 drops into both eyes 2 (two) times daily.   . travoprost, benzalkonium, (TRAVATAN) 0.004 % ophthalmic solution 1 drop as directed.    . warfarin (COUMADIN) 3 MG tablet 3 mg. Takes on Tues, Thurs, Sat and Sun. Takes 3 and 1/2 tablet on Mon, Wed and Friday.     Review of Systems  Constitutional: Positive for fatigue.  HENT: Negative.   Eyes: Negative.   Respiratory: Negative.   Cardiovascular: Negative.   Gastrointestinal: Negative.   Musculoskeletal: Positive for arthralgias, back pain, gait problem and myalgias.  Skin: Negative.   Neurological: Negative.   Psychiatric/Behavioral: Negative.   All other systems reviewed and are negative.    BP 112/84  Pulse 65  Ht 5\' 6"  (1.676 m)  Wt 253 lb 12 oz (115.1 kg)  BMI 40.98 kg/m2  Physical Exam  Nursing note and vitals reviewed. Constitutional: She is oriented to person, place, and time. She appears well-developed and well-nourished.  obese  HENT:  Head: Normocephalic.  Nose: Nose normal.  Mouth/Throat: Oropharynx is clear and moist.  Eyes: Conjunctivae are normal. Pupils are equal, round, and reactive to light.  Neck: Normal range of motion. Neck supple. No JVD present.  Cardiovascular: Normal rate, regular rhythm, S1 normal, S2 normal, normal heart sounds and intact distal pulses.  Exam reveals no gallop and no friction rub.   No murmur heard. Pulmonary/Chest: Effort normal and breath sounds normal. No respiratory distress. She has no wheezes. She has no rales. She exhibits no tenderness.  Abdominal: Soft. Bowel sounds are normal. She exhibits no distension. There is no tenderness.  Musculoskeletal: Normal range of motion. She exhibits  no edema and no tenderness.  Lymphadenopathy:    She has no cervical adenopathy.  Neurological: She is alert and oriented to person, place, and time. Coordination normal.  Skin: Skin is warm and dry. No rash noted. No erythema.  Psychiatric: She has a normal mood and affect. Her behavior is normal. Judgment and thought content normal.    Assessment and Plan

## 2013-10-22 ENCOUNTER — Telehealth: Payer: Self-pay

## 2013-10-22 NOTE — Telephone Encounter (Signed)
Spoke w/ [name] [name].  Explained to [name] [name] that she needs to take it easy until her appt w/ Dr. Caryl Comes to have her pacer reprogrammed. She reports a 1.6 lb weight gain since yesterday, but reports that she feels fine.  Offered [name] [name] an appt w/ Dr. Caryl Comes in [place] office today, but she states that she would rather wait and see him in [place]. [name] [name] to continue to monitor wt and call if symptoms worsen.

## 2013-10-22 NOTE — Telephone Encounter (Signed)
Pt called and states she is no better her weight this morning is 250. Please call

## 2013-10-23 ENCOUNTER — Telehealth: Payer: Self-pay | Admitting: Internal Medicine

## 2013-10-23 NOTE — Telephone Encounter (Signed)
Spoke with patient's niece. Sen by Dr.Gollan on 11/18 and he advised that she see Dr.Klein on 11/21. Feels like she can't wait to see Dr.Klein on Tuesday, wants to bring her in this PM because of SOB and choking sensations. Advised will discuss with Dr.Klein and call her back.

## 2013-10-23 NOTE — Telephone Encounter (Signed)
New Problem:  Pt's niece is calling requesting the pt be seen today. Niece states the pt is having tightness in her chest and pt is having a lil SOB. Please advise

## 2013-10-23 NOTE — Telephone Encounter (Signed)
Chart reviewed by Dr.Klein and he has advised that he will see her tomorrow in [place] at [place] (her niece) is aware.

## 2013-10-24 ENCOUNTER — Encounter: Payer: Medicare Other | Admitting: Cardiology

## 2013-10-24 ENCOUNTER — Encounter: Payer: Self-pay | Admitting: *Deleted

## 2013-10-24 ENCOUNTER — Encounter: Payer: Self-pay | Admitting: Internal Medicine

## 2013-10-24 ENCOUNTER — Ambulatory Visit (INDEPENDENT_AMBULATORY_CARE_PROVIDER_SITE_OTHER): Payer: Medicare Other | Admitting: Internal Medicine

## 2013-10-24 VITALS — BP 123/85 | HR 67 | Ht 66.0 in | Wt 254.5 lb

## 2013-10-24 DIAGNOSIS — I5033 Acute on chronic diastolic (congestive) heart failure: Secondary | ICD-10-CM

## 2013-10-24 DIAGNOSIS — R609 Edema, unspecified: Secondary | ICD-10-CM

## 2013-10-24 DIAGNOSIS — I4891 Unspecified atrial fibrillation: Secondary | ICD-10-CM

## 2013-10-24 DIAGNOSIS — I509 Heart failure, unspecified: Secondary | ICD-10-CM

## 2013-10-24 DIAGNOSIS — R0602 Shortness of breath: Secondary | ICD-10-CM

## 2013-10-24 DIAGNOSIS — Z950 Presence of cardiac pacemaker: Secondary | ICD-10-CM

## 2013-10-24 DIAGNOSIS — I5022 Chronic systolic (congestive) heart failure: Secondary | ICD-10-CM

## 2013-10-24 DIAGNOSIS — R55 Syncope and collapse: Secondary | ICD-10-CM

## 2013-10-24 LAB — MDC_IDC_ENUM_SESS_TYPE_INCLINIC
Battery Voltage: 2.81 V
Lead Channel Impedance Value: 432 Ohm
Lead Channel Setting Pacing Amplitude: 2 V
Lead Channel Setting Pacing Amplitude: 2.5 V
Lead Channel Setting Pacing Pulse Width: 0.4 ms
Lead Channel Setting Sensing Sensitivity: 2.1 mV

## 2013-10-24 NOTE — Assessment & Plan Note (Signed)
They should improve with diuresis as well as restoration of AV synchrony

## 2013-10-24 NOTE — Patient Instructions (Addendum)
Your physician recommends that you continue on your current medications as directed. Please refer to the Current Medication list given to you today.  You need a pacemaker generator change - scheduled for 12/01/13 at 9:30 am - be at hospital at 7:30 am Your physician recommends that you return for lab work on: 11/24/13

## 2013-10-24 NOTE — Assessment & Plan Note (Signed)
Device was reprogrammed today to restore AV sequential pacing. She underwent device generator replacement

## 2013-10-24 NOTE — Assessment & Plan Note (Signed)
Intermittent episodes of atrial fibrillation on anticoagulation

## 2013-10-24 NOTE — Assessment & Plan Note (Signed)
No recurrent syncope

## 2013-10-24 NOTE — Assessment & Plan Note (Signed)
The patient has  had acute on chronic diastolic heart failure likely associated and secondary to reversion to VVI non-r rate responsive   Pacing. Her device was reprogrammed to be back to the A-D. Mode  she'll need to undergo device generator replacement. We have reviewed the benefits and risks of generator replacement.  These include but are not limited to lead fracture and infection.  The patient understands, agrees and is willing to proceed.

## 2013-10-24 NOTE — Progress Notes (Signed)
Patient Care Team: Keith Rake as PCP - General (Family Medicine)   HPI  [name] [name] is a 78 y.o. female  Seen in followup for pacemaker implantation for sinus node dysfunction and chronotropic incompetence. She also has a history of atrial fibrillation and PVCs. She has hx of syncope  cardiac catheterization in 2004, stress testing,  echo showed normal systolic function, mild LVH, diastolic relaxation abnormality, mildly elevated right ventricular systolic pressures, mild MR.  Her device has  reached ERI  This was associated with reversion to the VI in mid-late October and ended up  triggering of hospitalization for heart failure; theer was also a subsequent visit for CHF to ER  Past Medical History  Diagnosis Date  . Sinoatrial node dysfunction   . Atrial fibrillation   . Unspecified essential hypertension   . Cardiac pacemaker in situ   . Esophageal reflux   . Unspecified glaucoma(365.9)   . Type II or unspecified type diabetes mellitus without mention of complication, not stated as uncontrolled   . Unspecified glaucoma(365.9)   . Type II or unspecified type diabetes mellitus without mention of complication, not stated as uncontrolled   . Obstructive sleep apnea   . Obese   . Palpitations   . History of UTI   . Syncope and collapse   . History of syncope   . Hyperlipidemia   . CHF (congestive heart failure)     Past Surgical History  Procedure Laterality Date  . Hysterectomy (other)  1970  . Cyst on right leg  03/1998  . Pacemaker insertion  2008    Current Outpatient Prescriptions  Medication Sig Dispense Refill  . ALPRAZolam (XANAX) 0.5 MG tablet Take 0.5 mg by mouth as needed. 1/2-1 prn      . atorvastatin (LIPITOR) 20 MG tablet TAKE ONE TABLET BY MOUTH DAILY.  30 tablet  6  . diltiazem (CARDIZEM CD) 240 MG 24 hr capsule Take 1 capsule (240 mg total) by mouth daily.  30 capsule  6  . esomeprazole (NEXIUM) 40 MG capsule Take 40 mg by mouth 2 (two) times  daily.        . furosemide (LASIX) 20 MG tablet Take 20 mg by mouth daily.       Marland Kitchen glipiZIDE (GLUCOTROL) 10 MG tablet Take 10 mg by mouth 2 (two) times daily before a meal.      . metFORMIN (GLUCOPHAGE) 500 MG tablet Take 500 mg by mouth 2 (two) times daily with a meal.      . mometasone (ELOCON) 0.1 % cream Apply topically daily. Apply to ear as directed two times a day for 10 days as needed         . Multiple Vitamins-Minerals (CENTRUM SILVER PO) Take by mouth daily.      . Nebivolol HCl (BYSTOLIC) 20 MG TABS Take 1 tablet (20 mg total) by mouth 2 (two) times daily.  180 tablet  3  . NON FORMULARY c-pap      . olmesartan (BENICAR) 40 MG tablet Take 40 mg by mouth daily.      . Omega-3 Fatty Acids (FISH OIL) 1200 MG CAPS Take 1,200 mg by mouth daily.      Marland Kitchen SIMBRINZA 1-0.2 % SUSP Place 2 drops into both eyes 2 (two) times daily.       . travoprost, benzalkonium, (TRAVATAN) 0.004 % ophthalmic solution 1 drop as directed.        . warfarin (COUMADIN) 3 MG tablet  3 mg. Takes on Tues, Thurs, Sat and Sun. Takes 3 and 1/2 tablet on Mon, Wed and Friday.       No current facility-administered medications for this visit.    No Known Allergies  Review of Systems negative except from HPI and PMH  Physical Exam BP 123/85  Pulse 67  Ht 5\' 6"  (1.676 m)  Wt 254 lb 8 oz (115.44 kg)  BMI 41.10 kg/m2 Well developed and well nourished in no acute distress HENT normal E scleral and icterus clear Neck Supple JVP 8-10carotids brisk and full Clear to ausculation  Regular rate and rhythm, no murmurs gallops or rub Soft with active bowel sounds No clubbing cyanosis 1+ Edema Alert and oriented, grossly normal motor and sensory function Skin Warm and Dry    Assessment and  Plan

## 2013-10-27 ENCOUNTER — Other Ambulatory Visit: Payer: Self-pay | Admitting: *Deleted

## 2013-10-27 ENCOUNTER — Encounter: Payer: Self-pay | Admitting: *Deleted

## 2013-10-27 DIAGNOSIS — E7800 Pure hypercholesterolemia, unspecified: Secondary | ICD-10-CM

## 2013-10-27 MED ORDER — DILTIAZEM HCL ER COATED BEADS 240 MG PO CP24: 240.0000 mg | ORAL_CAPSULE | Freq: Every day | ORAL | Status: DC

## 2013-10-27 MED ORDER — ATORVASTATIN CALCIUM 20 MG PO TABS: ORAL_TABLET | ORAL | Status: DC

## 2013-10-27 NOTE — Telephone Encounter (Signed)
Requested Prescriptions   Signed Prescriptions Disp Refills  . atorvastatin (LIPITOR) 20 MG tablet 30 tablet 6    Sig: TAKE ONE TABLET BY MOUTH DAILY.    Authorizing Provider: Minna Merritts    Ordering User: Britt Bottom diltiazem (CARDIZEM CD) 240 MG 24 hr capsule 30 capsule 6    Sig: Take 1 capsule (240 mg total) by mouth daily.    Authorizing Provider: Minna Merritts    Ordering User: Britt Bottom

## 2013-10-28 ENCOUNTER — Encounter: Payer: Medicare Other | Admitting: Internal Medicine

## 2013-11-07 ENCOUNTER — Encounter: Payer: Self-pay | Admitting: Cardiovascular Disease

## 2013-11-07 ENCOUNTER — Ambulatory Visit: Payer: Self-pay | Admitting: Family Medicine

## 2013-11-21 ENCOUNTER — Ambulatory Visit: Payer: Self-pay | Admitting: Family Medicine

## 2013-11-24 ENCOUNTER — Encounter: Payer: Self-pay | Admitting: Cardiovascular Disease

## 2013-11-24 ENCOUNTER — Ambulatory Visit (INDEPENDENT_AMBULATORY_CARE_PROVIDER_SITE_OTHER): Payer: Medicare Other | Admitting: Cardiovascular Disease

## 2013-11-24 ENCOUNTER — Ambulatory Visit (INDEPENDENT_AMBULATORY_CARE_PROVIDER_SITE_OTHER): Payer: Medicare Other

## 2013-11-24 VITALS — BP 124/78 | HR 62 | Ht 66.0 in | Wt 249.2 lb

## 2013-11-24 DIAGNOSIS — I4891 Unspecified atrial fibrillation: Secondary | ICD-10-CM

## 2013-11-24 DIAGNOSIS — Z950 Presence of cardiac pacemaker: Secondary | ICD-10-CM

## 2013-11-24 DIAGNOSIS — I5022 Chronic systolic (congestive) heart failure: Secondary | ICD-10-CM

## 2013-11-24 DIAGNOSIS — I5033 Acute on chronic diastolic (congestive) heart failure: Secondary | ICD-10-CM

## 2013-11-24 DIAGNOSIS — R609 Edema, unspecified: Secondary | ICD-10-CM

## 2013-11-24 DIAGNOSIS — I509 Heart failure, unspecified: Secondary | ICD-10-CM

## 2013-11-24 DIAGNOSIS — I10 Essential (primary) hypertension: Secondary | ICD-10-CM

## 2013-11-24 DIAGNOSIS — R042 Hemoptysis: Secondary | ICD-10-CM

## 2013-11-24 NOTE — Progress Notes (Signed)
Patient ID: [name] [name], female    DOB: 05/29/1935, 78 y.o.   MRN: [id number]  HPI Comments: [name] [name] is a 78 yo woman with a history of atrial fibrillation, symptomatic palpitations/PVCs, status post pacemaker implantation for sick sinus syndrome/tachybradycardia syndrome with chronic symptoms of palpitations, shortness of breath with exertion, who presents for routine followup. History of chronic fatigue  On her last clinic visit, her pacer was in her life and she had converted to VVI. This was reprogrammed by Nevin Bloodgood and she felt better. Scheduled for pacer change out on 12/01/2013. In general she is feeling well. She is spitting up some blood, some heavy periods of coughing.   hospital admission on 09/24/2013 for acute on chronic diastolic CHF. She was drinking a significant amount of fluids. She was treated with IV diuretics with improvement of her symptoms. Was recommended that she go home with Lasix every other day. She had a stress test while in the hospital showing no ischemia. Echocardiogram dated 09/24/2013 showing ejection fraction 50-55%,, mild to moderate tricuspid regurg, mild to moderate MR, normal RV function, normal right ventricular systolic pressure   ER again on 10/18/2013.   Previous hx of shortness of breath and palpitations.  May 2012, she had adjustments to her pacemaker and since that time has felt back to normal.   she has had long hx of hot flashes, and a racing heart rate when she is in bed. Recently this has been well-controlled on high-dose bystolic.   chronic back and hip pain. She did receive a cortisone shot on the left with no significant improvement. She uses her CPAP  She reports that her sugars have been well-controlled, blood pressure well controlled on current medications   syncope episode August 2013 .  in her bedroom folding clothes and the next thing she knew she found herself on the floor in the bathroom. She spent 2 days in the hospital, then at least  10 days in rehabilitation for significant left arm bruising and she was on warfarin. Pacemaker was interrogated after the fact showing 85% atrial paced rhythm, no other arrhythmia. Etiology concerning for vasovagal episode, though exact cause is uncertain.  She has chronic buzzing in her ear. She does report this is a chronic issue going back many years. Prior workup with ear nose throat. She does have prior exposure to loud noises, worked in a [employer].   cardiac catheterization in 2004, stress testing,   echo  showed normal systolic function, mild LVH, diastolic relaxation abnormality, mildly elevated right ventricular systolic pressures, mild MR.  treadmill study showing normal blood pressure response to exercise, chronotropic incompetence with peak heart rate of 100 beats per minute, resting heart rate in the 70s. Previous  PFTs  demonstrated extrinsic compression/restrictive physiology.   Total cholesterol 155, LDL 84, hemoglobin A1c 6.5 EKG shows  paced rhythm rate 62 beats per minute      Outpatient Encounter Prescriptions as of 11/24/2013  Medication Sig  . ACCU-CHEK AVIVA PLUS test strip 1 each as needed.   . ALPRAZolam (XANAX) 0.5 MG tablet Take 0.5 mg by mouth daily as needed.   Marland Kitchen atorvastatin (LIPITOR) 20 MG tablet Take 20 mg by mouth daily.  . Blood Glucose Monitoring Suppl (ACCU-CHEK AVIVA PLUS) W/DEVICE KIT daily.   Marland Kitchen diltiazem (CARDIZEM CD) 240 MG 24 hr capsule Take 1 capsule (240 mg total) by mouth daily.  Marland Kitchen esomeprazole (NEXIUM) 40 MG capsule Take 40 mg by mouth daily.   . furosemide (LASIX)  20 MG tablet Take 20 mg by mouth daily as needed for fluid.  Marland Kitchen glipiZIDE (GLUCOTROL) 10 MG tablet Take 10 mg by mouth 2 (two) times daily before a meal.  . metFORMIN (GLUCOPHAGE-XR) 500 MG 24 hr tablet 500 mg 2 (two) times daily.   Marland Kitchen MICROLET LANCETS MISC as needed.   . mometasone (ELOCON) 0.1 % cream Apply topically daily. Apply to ear as directed two times a day for 10 days as  needed     . Multiple Vitamins-Minerals (CENTRUM SILVER PO) Take by mouth daily.  . Nebivolol HCl (BYSTOLIC) 20 MG TABS Take 1 tablet (20 mg total) by mouth 2 (two) times daily.  Marland Kitchen olmesartan (BENICAR) 40 MG tablet Take 40 mg by mouth daily.  . Omega-3 Fatty Acids (FISH OIL) 1200 MG CAPS Take 1,200 mg by mouth daily.  Marland Kitchen SIMBRINZA 1-0.2 % SUSP Place 1 drop into both eyes 2 (two) times daily.   . travoprost, benzalkonium, (TRAVATAN) 0.004 % ophthalmic solution Place 1 drop into both eyes at bedtime.   Marland Kitchen warfarin (COUMADIN) 3 MG tablet Take 3-4.5 mg by mouth daily at 6 PM. Takes 3mg  on Tues, Thurs, Sat and Sun. Takes 4.5mg  on Mon, Wed and Friday.     Review of Systems  HENT: Negative.   Eyes: Negative.   Respiratory: Negative.   Cardiovascular: Negative.   Gastrointestinal: Negative.   Endocrine: Negative.   Musculoskeletal: Positive for arthralgias, back pain, gait problem and myalgias.  Skin: Negative.   Allergic/Immunologic: Negative.   Neurological: Negative.   Hematological: Negative.   Psychiatric/Behavioral: Negative.   All other systems reviewed and are negative.    BP 124/78  Pulse 62  Ht 5\' 6"  (1.676 m)  Wt 249 lb 4 oz (113.059 kg)  BMI 40.25 kg/m2  Physical Exam  Nursing note and vitals reviewed. Constitutional: She is oriented to person, place, and time. She appears well-developed and well-nourished.  obese  HENT:  Head: Normocephalic.  Nose: Nose normal.  Mouth/Throat: Oropharynx is clear and moist.  Eyes: Conjunctivae are normal. Pupils are equal, round, and reactive to light.  Neck: Normal range of motion. Neck supple. No JVD present.  Cardiovascular: Normal rate, regular rhythm, S1 normal, S2 normal, normal heart sounds and intact distal pulses.  Exam reveals no gallop and no friction rub.   No murmur heard. Pulmonary/Chest: Effort normal and breath sounds normal. No respiratory distress. She has no wheezes. She has no rales. She exhibits no tenderness.   Abdominal: Soft. Bowel sounds are normal. She exhibits no distension. There is no tenderness.  Musculoskeletal: Normal range of motion. She exhibits no edema and no tenderness.  Lymphadenopathy:    She has no cervical adenopathy.  Neurological: She is alert and oriented to person, place, and time. Coordination normal.  Skin: Skin is warm and dry. No rash noted. No erythema.  Psychiatric: She has a normal mood and affect. Her behavior is normal. Judgment and thought content normal.    Assessment and Plan

## 2013-11-24 NOTE — Patient Instructions (Signed)
You are doing well. No medication changes were made.  We will do lab work today  Please call us if you have new issues that need to be addressed before your next appt.  Your physician wants you to follow-up in: 6 months.  You will receive a reminder letter in the mail two months in advance. If you don't receive a letter, please call our office to schedule the follow-up appointment.

## 2013-11-24 NOTE — Assessment & Plan Note (Signed)
Blood pressure is well controlled on today's visit. No changes made to the medications. 

## 2013-11-24 NOTE — Assessment & Plan Note (Signed)
Device change out scheduled 12/01/2013. She will stop warfarin several days prior to the procedure

## 2013-11-24 NOTE — Assessment & Plan Note (Signed)
Appears relatively euvolemic on today's visit. No further medication changes.

## 2013-11-24 NOTE — Addendum Note (Signed)
Addended by: Minna Merritts on: 11/24/2013 01:37 PM   Modules accepted: Level of Service

## 2013-11-24 NOTE — Assessment & Plan Note (Signed)
No significant edema on today's visit

## 2013-11-24 NOTE — Assessment & Plan Note (Signed)
Etiology of her hemoptysis is not clear. Suggested we monitor her for now. She will holding warfarin for several days prior to pacemaker  change out 12/01/2013. If she continues to have hemoptysis and only January, when she restarts her warfarin, we have suggested she call our office. Chest imaging such as CT scan of the chest could be performed.

## 2013-11-25 LAB — CBC WITH DIFFERENTIAL/PLATELET
Basophils Absolute: 0 10*3/uL (ref 0.0–0.2)
Basos: 0 %
Eos: 2 %
Eosinophils Absolute: 0.2 10*3/uL (ref 0.0–0.4)
HCT: 33.1 % — ABNORMAL LOW (ref 34.0–46.6)
Hemoglobin: 10.4 g/dL — ABNORMAL LOW (ref 11.1–15.9)
Immature Grans (Abs): 0 10*3/uL (ref 0.0–0.1)
Immature Granulocytes: 0 %
Lymphocytes Absolute: 2 10*3/uL (ref 0.7–3.1)
Lymphs: 26 %
MCH: 23.1 pg — ABNORMAL LOW (ref 26.6–33.0)
MCHC: 31.4 g/dL — ABNORMAL LOW (ref 31.5–35.7)
MCV: 74 fL — ABNORMAL LOW (ref 79–97)
Monocytes Absolute: 0.4 10*3/uL (ref 0.1–0.9)
Monocytes: 5 %
Neutrophils Absolute: 5.2 10*3/uL (ref 1.4–7.0)
Neutrophils Relative %: 67 %
RBC: 4.5 x10E6/uL (ref 3.77–5.28)
RDW: 17.4 % — ABNORMAL HIGH (ref 12.3–15.4)
WBC: 7.8 10*3/uL (ref 3.4–10.8)

## 2013-11-25 LAB — BASIC METABOLIC PANEL
BUN/Creatinine Ratio: 11 (ref 11–26)
BUN: 12 mg/dL (ref 8–27)
CO2: 20 mmol/L (ref 18–29)
Calcium: 9.7 mg/dL (ref 8.6–10.2)
Chloride: 105 mmol/L (ref 97–108)
Creatinine, Ser: 1.09 mg/dL — ABNORMAL HIGH (ref 0.57–1.00)
GFR calc Af Amer: 57 mL/min/{1.73_m2} — ABNORMAL LOW (ref >59)
GFR calc non Af Amer: 49 mL/min/{1.73_m2} — ABNORMAL LOW (ref >59)
Glucose: 123 mg/dL — ABNORMAL HIGH (ref 65–99)
Potassium: 4.5 mmol/L (ref 3.5–5.2)
Sodium: 142 mmol/L (ref 134–144)

## 2013-11-25 LAB — PROTIME-INR
INR: 3.1 — ABNORMAL HIGH (ref 0.8–1.2)
Prothrombin Time: 32.7 s — ABNORMAL HIGH (ref 9.1–12.0)

## 2013-11-30 MED ORDER — SODIUM CHLORIDE 0.9 % IV SOLN: INTRAVENOUS | Status: DC

## 2013-11-30 MED ORDER — CEFAZOLIN SODIUM 10 G IJ SOLR
3.0000 g | INTRAMUSCULAR | Status: DC
  Filled 2013-11-30: qty 3000

## 2013-11-30 MED ORDER — SODIUM CHLORIDE 0.9 % IR SOLN
80.0000 mg | Status: DC
  Filled 2013-11-30: qty 2

## 2013-11-30 MED ORDER — CHLORHEXIDINE GLUCONATE 4 % EX LIQD
60.0000 mL | Freq: Once | CUTANEOUS | Status: DC
  Filled 2013-11-30: qty 60

## 2013-12-01 ENCOUNTER — Ambulatory Visit (HOSPITAL_COMMUNITY)
Admission: RE | Admit: 2013-12-01 | Discharge: 2013-12-02 | Disposition: A | Discharge status: Home / Self Care | Payer: Medicare Other | Source: Ambulatory Visit | Attending: Internal Medicine | Admitting: Internal Medicine

## 2013-12-01 ENCOUNTER — Encounter (HOSPITAL_COMMUNITY): Payer: Self-pay | Admitting: General Practice

## 2013-12-01 ENCOUNTER — Encounter (HOSPITAL_COMMUNITY)
Admission: RE | Disposition: A | Discharge status: Home / Self Care | Payer: Self-pay | Source: Ambulatory Visit | Attending: Internal Medicine

## 2013-12-01 DIAGNOSIS — Z45018 Encounter for adjustment and management of other part of cardiac pacemaker: Secondary | ICD-10-CM | POA: Insufficient documentation

## 2013-12-01 DIAGNOSIS — E785 Hyperlipidemia, unspecified: Secondary | ICD-10-CM | POA: Insufficient documentation

## 2013-12-01 DIAGNOSIS — I499 Cardiac arrhythmia, unspecified: Secondary | ICD-10-CM | POA: Diagnosis present

## 2013-12-01 DIAGNOSIS — Z7901 Long term (current) use of anticoagulants: Secondary | ICD-10-CM | POA: Insufficient documentation

## 2013-12-01 DIAGNOSIS — R609 Edema, unspecified: Secondary | ICD-10-CM

## 2013-12-01 DIAGNOSIS — E669 Obesity, unspecified: Secondary | ICD-10-CM | POA: Insufficient documentation

## 2013-12-01 DIAGNOSIS — I5022 Chronic systolic (congestive) heart failure: Secondary | ICD-10-CM

## 2013-12-01 DIAGNOSIS — Z950 Presence of cardiac pacemaker: Secondary | ICD-10-CM

## 2013-12-01 DIAGNOSIS — I5033 Acute on chronic diastolic (congestive) heart failure: Secondary | ICD-10-CM

## 2013-12-01 DIAGNOSIS — I495 Sick sinus syndrome: Secondary | ICD-10-CM | POA: Insufficient documentation

## 2013-12-01 DIAGNOSIS — I509 Heart failure, unspecified: Secondary | ICD-10-CM | POA: Insufficient documentation

## 2013-12-01 DIAGNOSIS — R002 Palpitations: Secondary | ICD-10-CM | POA: Insufficient documentation

## 2013-12-01 DIAGNOSIS — R0602 Shortness of breath: Secondary | ICD-10-CM

## 2013-12-01 DIAGNOSIS — K219 Gastro-esophageal reflux disease without esophagitis: Secondary | ICD-10-CM | POA: Insufficient documentation

## 2013-12-01 DIAGNOSIS — E119 Type 2 diabetes mellitus without complications: Secondary | ICD-10-CM | POA: Insufficient documentation

## 2013-12-01 DIAGNOSIS — I4891 Unspecified atrial fibrillation: Secondary | ICD-10-CM

## 2013-12-01 DIAGNOSIS — I10 Essential (primary) hypertension: Secondary | ICD-10-CM | POA: Insufficient documentation

## 2013-12-01 DIAGNOSIS — G4733 Obstructive sleep apnea (adult) (pediatric): Secondary | ICD-10-CM | POA: Insufficient documentation

## 2013-12-01 DIAGNOSIS — R55 Syncope and collapse: Secondary | ICD-10-CM

## 2013-12-01 HISTORY — DX: Unspecified osteoarthritis, unspecified site: M19.90

## 2013-12-01 HISTORY — DX: Anxiety disorder, unspecified: F41.9

## 2013-12-01 HISTORY — DX: Personal history of other diseases of the digestive system: Z87.19

## 2013-12-01 HISTORY — PX: PERMANENT PACEMAKER GENERATOR CHANGE: SHX6022

## 2013-12-01 HISTORY — DX: Type 2 diabetes mellitus without complications: E11.9

## 2013-12-01 HISTORY — DX: Obstructive sleep apnea (adult) (pediatric): G47.33

## 2013-12-01 HISTORY — DX: Presence of cardiac pacemaker: Z95.0

## 2013-12-01 HISTORY — DX: Anemia, unspecified: D64.9

## 2013-12-01 HISTORY — DX: Dependence on other enabling machines and devices: Z99.89

## 2013-12-01 LAB — GLUCOSE, CAPILLARY
Glucose-Capillary: 68 mg/dL — ABNORMAL LOW (ref 70–99)
Glucose-Capillary: 69 mg/dL — ABNORMAL LOW (ref 70–99)
Glucose-Capillary: 80 mg/dL (ref 70–99)
Glucose-Capillary: 106 mg/dL — ABNORMAL HIGH (ref 70–99)
Glucose-Capillary: 87 mg/dL (ref 70–99)
Glucose-Capillary: 145 mg/dL — ABNORMAL HIGH (ref 70–99)

## 2013-12-01 LAB — SURGICAL PCR SCREEN
MRSA, PCR: POSITIVE — AB
Staphylococcus aureus: POSITIVE — AB

## 2013-12-01 LAB — PLATELET COUNT: Platelets: 195 10*3/uL (ref 150–400)

## 2013-12-01 LAB — PROTIME-INR
INR: 2.48 — ABNORMAL HIGH (ref 0.00–1.49)
Prothrombin Time: 26 seconds — ABNORMAL HIGH (ref 11.6–15.2)

## 2013-12-01 SURGERY — PERMANENT PACEMAKER GENERATOR CHANGE
Anesthesia: LOCAL

## 2013-12-01 MED ORDER — ATORVASTATIN CALCIUM 20 MG PO TABS
20.0000 mg | ORAL_TABLET | Freq: Every day | ORAL | Status: DC
  Administered 2013-12-01: 18:00:00 20 mg via ORAL
  Filled 2013-12-01 (×2): qty 1

## 2013-12-01 MED ORDER — DILTIAZEM HCL ER COATED BEADS 240 MG PO CP24
240.0000 mg | ORAL_CAPSULE | Freq: Every day | ORAL | Status: DC
  Administered 2013-12-01 – 2013-12-02 (×2): 240 mg via ORAL
  Filled 2013-12-01 (×2): qty 1

## 2013-12-01 MED ORDER — TRAVOPROST (BAK FREE) 0.004 % OP SOLN
1.0000 [drp] | Freq: Every day | OPHTHALMIC | Status: DC
  Administered 2013-12-01: 1 [drp] via OPHTHALMIC
  Filled 2013-12-01: qty 2.5

## 2013-12-01 MED ORDER — WARFARIN SODIUM 3 MG PO TABS
3.0000 mg | ORAL_TABLET | ORAL | Status: DC
  Filled 2013-12-01: qty 1

## 2013-12-01 MED ORDER — DEXTROSE 50 % IV SOLN
INTRAVENOUS | Status: AC
  Filled 2013-12-01: qty 50

## 2013-12-01 MED ORDER — MUPIROCIN 2 % EX OINT
TOPICAL_OINTMENT | CUTANEOUS | Status: AC
  Filled 2013-12-01: qty 22

## 2013-12-01 MED ORDER — WARFARIN SODIUM 3 MG PO TABS: 3.0000 mg | ORAL_TABLET | Freq: Every day | ORAL | Status: DC

## 2013-12-01 MED ORDER — METFORMIN HCL ER 500 MG PO TB24
500.0000 mg | ORAL_TABLET | Freq: Two times a day (BID) | ORAL | Status: DC
  Administered 2013-12-02: 09:00:00 500 mg via ORAL
  Filled 2013-12-01 (×4): qty 1

## 2013-12-01 MED ORDER — DEXTROSE 50 % IV SOLN: 25.0000 mL | Freq: Once | INTRAVENOUS | Status: DC | PRN

## 2013-12-01 MED ORDER — ALPRAZOLAM 0.5 MG PO TABS
0.5000 mg | ORAL_TABLET | Freq: Every day | ORAL | Status: DC | PRN
  Administered 2013-12-01: 21:00:00 0.5 mg via ORAL
  Filled 2013-12-01: qty 1

## 2013-12-01 MED ORDER — WARFARIN SODIUM 4 MG PO TABS
4.5000 mg | ORAL_TABLET | ORAL | Status: DC
  Administered 2013-12-01: 4.5 mg via ORAL
  Filled 2013-12-01: qty 1

## 2013-12-01 MED ORDER — BRINZOLAMIDE 1 % OP SUSP
1.0000 [drp] | Freq: Two times a day (BID) | OPHTHALMIC | Status: DC
  Administered 2013-12-01 – 2013-12-02 (×2): 1 [drp] via OPHTHALMIC
  Filled 2013-12-01: qty 10

## 2013-12-01 MED ORDER — FUROSEMIDE 20 MG PO TABS
20.0000 mg | ORAL_TABLET | Freq: Every day | ORAL | Status: DC | PRN
  Filled 2013-12-01: qty 1

## 2013-12-01 MED ORDER — HYDRALAZINE HCL 20 MG/ML IJ SOLN: 5.0000 mg | Freq: Once | INTRAMUSCULAR | Status: AC | PRN

## 2013-12-01 MED ORDER — PANTOPRAZOLE SODIUM 40 MG PO TBEC
40.0000 mg | DELAYED_RELEASE_TABLET | Freq: Every day | ORAL | Status: DC
  Administered 2013-12-01 – 2013-12-02 (×2): 40 mg via ORAL
  Filled 2013-12-01 (×2): qty 1

## 2013-12-01 MED ORDER — DEXTROSE-NACL 5-0.45 % IV SOLN
INTRAVENOUS | Status: AC
  Administered 2013-12-01: 12:00:00 via INTRAVENOUS

## 2013-12-01 MED ORDER — GLIPIZIDE 10 MG PO TABS
10.0000 mg | ORAL_TABLET | Freq: Two times a day (BID) | ORAL | Status: DC
  Administered 2013-12-02: 10 mg via ORAL
  Filled 2013-12-01 (×4): qty 1

## 2013-12-01 MED ORDER — HYDRALAZINE HCL 20 MG/ML IJ SOLN: 5.0000 mg | INTRAMUSCULAR | Status: DC | PRN

## 2013-12-01 MED ORDER — MIDAZOLAM HCL 5 MG/5ML IJ SOLN
INTRAMUSCULAR | Status: AC
  Filled 2013-12-01: qty 5

## 2013-12-01 MED ORDER — IRBESARTAN 75 MG PO TABS
75.0000 mg | ORAL_TABLET | Freq: Every day | ORAL | Status: DC
  Administered 2013-12-01 – 2013-12-02 (×2): 75 mg via ORAL
  Filled 2013-12-01 (×2): qty 1

## 2013-12-01 MED ORDER — FENTANYL CITRATE 0.05 MG/ML IJ SOLN
INTRAMUSCULAR | Status: AC
  Filled 2013-12-01: qty 2

## 2013-12-01 MED ORDER — LIDOCAINE HCL (PF) 1 % IJ SOLN
INTRAMUSCULAR | Status: AC
  Filled 2013-12-01: qty 30

## 2013-12-01 MED ORDER — MUPIROCIN 2 % EX OINT
TOPICAL_OINTMENT | Freq: Two times a day (BID) | CUTANEOUS | Status: DC
  Administered 2013-12-02: 11:00:00 via NASAL
  Filled 2013-12-01 (×3): qty 22

## 2013-12-01 MED ORDER — ACETAMINOPHEN 325 MG PO TABS
325.0000 mg | ORAL_TABLET | ORAL | Status: DC | PRN
  Administered 2013-12-01: 14:00:00 650 mg via ORAL
  Filled 2013-12-01: qty 2

## 2013-12-01 MED ORDER — NEBIVOLOL HCL 10 MG PO TABS
20.0000 mg | ORAL_TABLET | Freq: Two times a day (BID) | ORAL | Status: DC
  Administered 2013-12-01 – 2013-12-02 (×3): 20 mg via ORAL
  Filled 2013-12-01 (×4): qty 2

## 2013-12-01 MED ORDER — BRIMONIDINE TARTRATE 0.2 % OP SOLN
1.0000 [drp] | Freq: Two times a day (BID) | OPHTHALMIC | Status: DC
  Administered 2013-12-01 – 2013-12-02 (×2): 1 [drp] via OPHTHALMIC
  Filled 2013-12-01: qty 5

## 2013-12-01 MED ORDER — GLUCOSE 40 % PO GEL
ORAL | Status: AC
  Administered 2013-12-01: 37.5 g
  Filled 2013-12-01: qty 1

## 2013-12-01 MED ORDER — GLUCOSE 40 % PO GEL
1.0000 | ORAL | Status: DC | PRN
  Filled 2013-12-01: qty 1

## 2013-12-01 MED ORDER — ONDANSETRON HCL 4 MG/2ML IJ SOLN: 4.0000 mg | Freq: Four times a day (QID) | INTRAMUSCULAR | Status: DC | PRN

## 2013-12-01 MED ORDER — WARFARIN - PHYSICIAN DOSING INPATIENT: Freq: Every day | Status: DC

## 2013-12-01 MED ORDER — NEBIVOLOL HCL 20 MG PO TABS: 20.0000 mg | ORAL_TABLET | Freq: Two times a day (BID) | ORAL | Status: DC

## 2013-12-01 NOTE — Progress Notes (Signed)
Orthopedic Tech Progress Note Patient Details:  [name] [name] 09-14-1935 [id number]  Ortho Devices Type of Ortho Device: Arm sling Ortho Device/Splint Location: rue Ortho Device/Splint Interventions: Application   Hildred Priest 12/01/2013, 1:26 PM

## 2013-12-01 NOTE — H&P (Signed)
       Patient Care Team: Keith Rake as PCP - General (Family Medicine)   HPI  [name] [name] is a 78 y.o. female Seen  for pacemaker generator replacement  Initially implanted for sinus node dysfunction and chronotropic incompetence. She also has a history of atrial fibrillation and PVCs. She has hx of syncope  Functionally stable without chest pain, but modest DOE   I   Past Medical History  Diagnosis Date  . Sinoatrial node dysfunction   . Atrial fibrillation   . Unspecified essential hypertension   . Cardiac pacemaker in situ   . Esophageal reflux   . Unspecified glaucoma(365.9)   . Type II or unspecified type diabetes mellitus without mention of complication, not stated as uncontrolled   . Unspecified glaucoma(365.9)   . Type II or unspecified type diabetes mellitus without mention of complication, not stated as uncontrolled   . Obstructive sleep apnea   . Obese   . Palpitations   . History of UTI   . Syncope and collapse   . History of syncope   . Hyperlipidemia   . CHF (congestive heart failure)     Past Surgical History  Procedure Laterality Date  . Hysterectomy (other)  1970  . Cyst on right leg  03/1998  . Pacemaker insertion  2008    Current Facility-Administered Medications  Medication Dose Route Frequency Provider Last Rate Last Dose  . 0.9 %  sodium chloride infusion   Intravenous Continuous Deboraha Sprang, MD      . ceFAZolin (ANCEF) 3 g in dextrose 5 % 50 mL IVPB  3 g Intravenous On Call Deboraha Sprang, MD      . chlorhexidine (HIBICLENS) 4 % liquid 4 application  60 mL Topical Once Deboraha Sprang, MD      . gentamicin (GARAMYCIN) 80 mg in sodium chloride irrigation 0.9 % 500 mL irrigation  80 mg Irrigation On Call Deboraha Sprang, MD        No Known Allergies  Review of Systems negative except from HPI and PMH  Physical Exam There were no vitals taken for this visit. Well developed and well nourished in no acute distress HENT normal E  scleral and icterus clear Neck Supple JVP flat; carotids brisk and full Clear to ausculation  Device pocket well healed; without hematoma or erythema.  There is no tethering but there is tenderness Regular rate and rhythm, no murmurs gallops or rub Soft with active bowel sounds No clubbing cyanosis none Edema Alert and oriented, grossly normal motor and sensory function Skin Warm and Dry    Assessment and  Plan  Sinus node dysfunction  Prev PM now at [facility]  HTN well controlled  HFpEF-euvolemic  AFib on coumadin    For pacemaker generator replacment today The benefits and risks were reviewed including but not limited to death,  perforation, infection, lead dislodgement and device malfunction.  The patient understands agrees and is willing to proceed.

## 2013-12-01 NOTE — CV Procedure (Signed)
Preoperative diagnosis  Sinus node dysfunction  Previous pacer now at [facility] Postoperative diagnosis same/   Procedure: Generator replacement     Following informed consent the patient was brought to the electrophysiology laboratory in place of the fluoroscopic table in the supine position after routine prep and drape lidocaine was infiltrated in the region of the previous incision and carried down to later the device pocket using sharp dissection and electrocautery. The pocket was opened the device was freed up and was explanted.  Interrogation of the previously implanted ventricular lead Medtronic [id number]  demonstrated an R wave of 9.7  millivolts., and impedance of 470 ohms, and a pacing threshold of 0.5 volts at 0.5 msec.    The previously implanted atrial lead Medtronic [id number] demonstrated a P-wave amplitude of 1.5  milllivolts  and impedance of  410ohms, and a pacing threshold of 0.4 volts at  @ 0.5 milliseconds.  The leads were inspected. The leads were then attached to a Medtronic ADApta L  pulse generator, serial number [id number].    The pocket was irrigated with antibiotic containing saline solution hemostasis was assured and the leads and the device were placed in the pocket. The wound was then closed in 3 layers in normal fashion.  The patient tolerated the procedure without apparent complication.  Virl Axe

## 2013-12-01 NOTE — Progress Notes (Signed)
Hypoglycemic Event  CBG: 69 at 1000  Treatment: 1 tube instant glucose  Symptoms: None  Follow-up CBG: Time:1015 CBG Result:80  Possible Reasons for Event: Inadequate meal intake  Comments/MD notified:will continue to monitor patient    [name]  Remember to initiate Hypoglycemia Order Set & complete

## 2013-12-02 ENCOUNTER — Encounter (HOSPITAL_COMMUNITY): Payer: Self-pay | Admitting: *Deleted

## 2013-12-02 ENCOUNTER — Other Ambulatory Visit: Payer: Self-pay

## 2013-12-02 DIAGNOSIS — R0602 Shortness of breath: Secondary | ICD-10-CM

## 2013-12-02 LAB — PROTIME-INR
INR: 2.72 — ABNORMAL HIGH (ref 0.00–1.49)
Prothrombin Time: 27.9 seconds — ABNORMAL HIGH (ref 11.6–15.2)

## 2013-12-02 LAB — GLUCOSE, CAPILLARY: Glucose-Capillary: 132 mg/dL — ABNORMAL HIGH (ref 70–99)

## 2013-12-02 MED ORDER — YOU HAVE A PACEMAKER BOOK
Freq: Once | Status: AC
  Administered 2013-12-02: 07:00:00
  Filled 2013-12-02: qty 1

## 2013-12-02 NOTE — Discharge Summary (Signed)
ELECTROPHYSIOLOGY DISCHARGE SUMMARY    Patient ID: [name] [name],  MRN: [id number], DOB/AGE: August 12, 1935 78 y.o.  Admit date: 12/01/2013 Discharge date: 12/02/2013  Primary Care Physician: Keith Rake, MD  Primary EP: Caryl Comes, MD  Primary Discharge Diagnosis:  1. PPM battery at [facility], now s/p PPM generator change 2. Sinus node dysfunction   Secondary Discharge Diagnoses:  1. PAF 2. DM 3. OSA 4. Dyslipidemia 5. HTN  Procedures This Admission:  1. PPM generator change Device - Medtronic Adapta L pulse generator, serial number [id number]  History and Hospital Course:  Ms. [name] is a 78 year old woman with sinus node dysfunction s/p PPM implant. Recently her PPM battery was found to be at [facility]. She presented yesterday and underwent PPM generator change. She tolerated this procedure well without any immediate complication. She remains hemodynamically stable and afebrile. Her implant site is intact without significant bleeding or hematoma. She has been given discharge instructions including wound care and activity restrictions. She will follow-up in 10 days for wound check. There were no changes made to her medications. She has been seen, examined and deemed stable for discharge today by Dr. Jolyn Nap.  Physical Exam:   Vitals: Blood pressure 184/58, pulse 62, temperature 98.4 F (36.9 C), temperature source Oral, resp. rate 20, height 5\' 6"  (1.676 m), weight 242 lb 8.1 oz (110 kg), SpO2 99.00%.  General: Well developed, well appearing 78 year old female in no acute distress. Neck: Supple. JVD not elevated. Lungs: Clear bilaterally to auscultation without wheezes, rales, or rhonchi. Breathing is unlabored. Heart: RRR S1 S2 without murmurs, rubs, or gallops.  Abdomen: Soft, non-distended. Extremities: No clubbing or cyanosis. No edema. Distal pedal pulses are 2+ and equal bilaterally. Neuro: Alert and oriented X 3. Moves all extremities spontaneously. No focal deficits. Skin: Left upper  chest / implant site intact without bleeding or hematoma.  Labs:    Component Value Date/Time   WBC 7.8 11/24/2013 1057   RBC 4.50 11/24/2013 1057   HGB 10.4* 11/24/2013 1057   HCT 33.1* 11/24/2013 1057   PLT 195 12/01/2013 0852   MCV 74* 11/24/2013 1057   MCH 23.1* 11/24/2013 1057   MCHC 31.4* 11/24/2013 1057   RDW 17.4* 11/24/2013 1057   LYMPHSABS 2.0 11/24/2013 1057   EOSABS 0.2 11/24/2013 1057   BASOSABS 0.0 11/24/2013 1057      Component Value Date/Time   NA 142 11/24/2013 1057   K 4.5 11/24/2013 1057   CL 105 11/24/2013 1057   CO2 20 11/24/2013 1057   GLUCOSE 123* 11/24/2013 1057   BUN 12 11/24/2013 1057   CREATININE 1.09* 11/24/2013 1057   CALCIUM 9.7 11/24/2013 1057   GFRNONAA 49* 11/24/2013 1057   GFRAA 57* 11/24/2013 1057    Recent Labs  12/02/13 0605  INR 2.72*    Disposition:  The patient is being discharged in stable condition.  Follow-up:     Follow-up Information   Follow up with [facility] On 12/10/2013. (At 2:30 PM for wound check)    Specialty:  Cardiology   Contact information:   [address] [place] [address] [phone]     Discharge Medications:    Medication List         ACCU-CHEK AVIVA PLUS test strip  Generic drug:  glucose blood  1 each as needed.     ACCU-CHEK AVIVA PLUS W/DEVICE Kit  daily.     ALPRAZolam 0.5 MG tablet  Commonly known as:  XANAX  Take 0.5 mg by mouth daily as needed.     atorvastatin 20 MG tablet  Commonly known as:  LIPITOR  Take 20 mg by mouth daily.     CENTRUM SILVER PO  Take by mouth daily.     diltiazem 240 MG 24 hr capsule  Commonly known as:  CARDIZEM CD  Take 1 capsule (240 mg total) by mouth daily.     Fish Oil 1200 MG Caps  Take 1,200 mg by mouth daily.     furosemide 20 MG tablet  Commonly known as:  LASIX  Take 20 mg by mouth daily as needed for fluid.     glipiZIDE 10 MG tablet  Commonly known as:  GLUCOTROL  Take 10 mg by mouth 2  (two) times daily before a meal.     metFORMIN 500 MG 24 hr tablet  Commonly known as:  GLUCOPHAGE-XR  500 mg 2 (two) times daily.     MICROLET LANCETS Misc  as needed.     mometasone 0.1 % cream  Commonly known as:  ELOCON  - Apply topically daily. Apply to ear as directed two times a day for 10 days as needed  -   -      Nebivolol HCl 20 MG Tabs  Commonly known as:  BYSTOLIC  Take 1 tablet (20 mg total) by mouth 2 (two) times daily.     NEXIUM 40 MG capsule  Generic drug:  esomeprazole  Take 40 mg by mouth daily.     olmesartan 40 MG tablet  Commonly known as:  BENICAR  Take 40 mg by mouth daily.     SIMBRINZA 1-0.2 % Susp  Generic drug:  Brinzolamide-Brimonidine  Place 1 drop into both eyes 2 (two) times daily.     TRAVATAN 0.004 % ophthalmic solution  Generic drug:  travoprost (benzalkonium)  Place 1 drop into both eyes at bedtime.     warfarin 3 MG tablet  Commonly known as:  COUMADIN  Take 3-4.5 mg by mouth daily at 6 PM. Takes 3mg  on Tues, Thurs, Sat and Sun. Takes 4.5mg  on Mon, Wed and Friday.       Duration of Discharge Encounter: Greater than 30 minutes including physician time.  Signed, Ileene Hutchinson, PA-C 12/02/2013, 8:35 AM  Agree with discharge. Blood pressures at home on the current medication regime   120-140. We'll defer making any changes at this time Follow up in [facility]

## 2013-12-09 ENCOUNTER — Ambulatory Visit (INDEPENDENT_AMBULATORY_CARE_PROVIDER_SITE_OTHER): Payer: Medicare Other | Admitting: *Deleted

## 2013-12-09 DIAGNOSIS — Z950 Presence of cardiac pacemaker: Secondary | ICD-10-CM

## 2013-12-09 DIAGNOSIS — I495 Sick sinus syndrome: Secondary | ICD-10-CM

## 2013-12-09 LAB — MDC_IDC_ENUM_SESS_TYPE_INCLINIC
Battery Impedance: 100 Ohm
Battery Remaining Longevity: 147 mo
Battery Voltage: 2.8 V
Brady Statistic AP VP Percent: 2 %
Brady Statistic AP VS Percent: 90 %
Brady Statistic AS VP Percent: 0 %
Brady Statistic AS VS Percent: 8 %
Date Time Interrogation Session: 20150106162420
Lead Channel Impedance Value: 465 Ohm
Lead Channel Impedance Value: 593 Ohm
Lead Channel Pacing Threshold Amplitude: 0.5 V
Lead Channel Pacing Threshold Amplitude: 0.75 V
Lead Channel Pacing Threshold Pulse Width: 0.4 ms
Lead Channel Pacing Threshold Pulse Width: 0.4 ms
Lead Channel Sensing Intrinsic Amplitude: 11.2 mV
Lead Channel Sensing Intrinsic Amplitude: 2 mV
Lead Channel Setting Pacing Amplitude: 2 V
Lead Channel Setting Pacing Amplitude: 2.5 V
Lead Channel Setting Pacing Pulse Width: 0.4 ms
Lead Channel Setting Sensing Sensitivity: 2.8 mV

## 2013-12-09 NOTE — Progress Notes (Signed)
Pt seen in device clinic for follow up of recently replaced pacemaker.  Wound well healed.  No redness, swelling, or edema.  No steri-strips---surgical glue present. Mild bleeding present + coumadin. Okay per Dr. Lovena Le.  Device interrogated and found to be functioning normally.  No changes made today. See PaceArt for full details.  Pt denies chest pain, shortness of breath, palpitations, or dizziness.  [facility] 03/12/14 & ROV with Dr. Caryl Comes 11/2014.   [name] [name] 12/09/2013 10:27 AM

## 2013-12-10 ENCOUNTER — Ambulatory Visit: Payer: Medicare Other

## 2013-12-23 ENCOUNTER — Ambulatory Visit (INDEPENDENT_AMBULATORY_CARE_PROVIDER_SITE_OTHER): Payer: Medicare Other | Admitting: *Deleted

## 2013-12-23 DIAGNOSIS — I4891 Unspecified atrial fibrillation: Secondary | ICD-10-CM

## 2013-12-23 DIAGNOSIS — I495 Sick sinus syndrome: Secondary | ICD-10-CM

## 2013-12-25 LAB — MDC_IDC_ENUM_SESS_TYPE_INCLINIC
Battery Impedance: 100 Ohm
Battery Remaining Longevity: 12
Battery Voltage: 2.8 V
Brady Statistic AP VP Percent: 6 %
Brady Statistic AP VS Percent: 89 %
Brady Statistic AS VP Percent: 1 %
Brady Statistic AS VS Percent: 5 %
Date Time Interrogation Session: 20150120214724
Lead Channel Impedance Value: 465 Ohm
Lead Channel Impedance Value: 539 Ohm
Lead Channel Pacing Threshold Amplitude: 0.5 V
Lead Channel Pacing Threshold Amplitude: 0.5 V
Lead Channel Pacing Threshold Pulse Width: 0.4 ms
Lead Channel Pacing Threshold Pulse Width: 0.4 ms
Lead Channel Sensing Intrinsic Amplitude: 8 mV
Lead Channel Setting Pacing Amplitude: 2 V
Lead Channel Setting Pacing Amplitude: 2.5 V
Lead Channel Setting Pacing Pulse Width: 0.4 ms
Lead Channel Setting Sensing Sensitivity: 4 mV

## 2013-12-25 NOTE — Progress Notes (Signed)
Pacemaker check in clinic for palps. Normal device function. Thresholds, sensing, impedances consistent with previous measurements. Device programmed to maximize longevity. 16 mode switches (all <30 sec, no EGMs). No high ventricular rates noted. Device programmed at appropriate safety margins. Histogram distribution appropriate for patient activity level, but patient's activity threshold was decreased to "medium/high" due to a feeling of rapid HRs when the patient's doing activities. Patient's lower rate was also increased to 75bpm to suppress PACs that she might be feeling when she feels her heart racing while at rest. Device programmed to optimize intrinsic conduction. Estimated longevity 12 years. Patient follow up with the device clinic on 01-05-2014.

## 2014-01-05 ENCOUNTER — Encounter: Payer: Self-pay | Admitting: Internal Medicine

## 2014-01-20 ENCOUNTER — Ambulatory Visit: Payer: Medicare Other | Admitting: Cardiovascular Disease

## 2014-02-04 ENCOUNTER — Ambulatory Visit: Payer: Self-pay | Admitting: Specialist

## 2014-02-09 ENCOUNTER — Telehealth: Payer: Self-pay

## 2014-02-09 ENCOUNTER — Telehealth: Payer: Self-pay | Admitting: Internal Medicine

## 2014-02-09 NOTE — Telephone Encounter (Signed)
New Message:  Pt states she feels like her pacemaker is beating too fast and wants to know if she needs to come in to get it reset. Pt would like a call back from the nurse.Marland KitchenMarland Kitchen

## 2014-02-09 NOTE — Telephone Encounter (Signed)
Would like ejection fraction. Please call.

## 2014-02-09 NOTE — Telephone Encounter (Signed)
Requested info given to [facility].

## 2014-02-09 NOTE — Telephone Encounter (Signed)
Pt states when waking up in morning, her heart is racing. States she can hear heart beat in her ears throughout day. Also states she has same sensations when walking but not [facility] Pt puts "sweet oil" & cotton balls in her ears to attempt to suppress sounds. I explained to pt that her device cannot slow down or stop fast rates but can see/record them; her device maintains her heart rate from going too slow. I also explained to pt that the heart sounds she hears internally cannot cease regardless of pacer programming.   After trouble shooting a transmission, we realized pt's home monitor serial number does NOT match enrolled number in [facility]. I will call [employer] to clarify the accurate serial number then return call to pt.

## 2014-02-10 NOTE — Telephone Encounter (Signed)
LMOVM updating pt that her [facility] enrollment has been updated.

## 2014-02-11 NOTE — Telephone Encounter (Signed)
Pt sent extra transmission. Pt's PACs have increased from < 0.1% to 2.6% of time. All other diagnostics consistent and normal.  Pt still wants to see Dr. Caryl Comes to discuss her heart racing in the morning.  ROV w/ Dr. Caryl Comes in [facility] 03/05/14 @ 10:45.

## 2014-03-05 ENCOUNTER — Ambulatory Visit (INDEPENDENT_AMBULATORY_CARE_PROVIDER_SITE_OTHER): Payer: Medicare Other | Admitting: Internal Medicine

## 2014-03-05 ENCOUNTER — Encounter: Payer: Self-pay | Admitting: Internal Medicine

## 2014-03-05 ENCOUNTER — Other Ambulatory Visit: Payer: Self-pay | Admitting: Cardiovascular Disease

## 2014-03-05 VITALS — BP 111/77 | HR 78 | Ht 64.0 in | Wt 247.8 lb

## 2014-03-05 DIAGNOSIS — I440 Atrioventricular block, first degree: Secondary | ICD-10-CM

## 2014-03-05 DIAGNOSIS — I5032 Chronic diastolic (congestive) heart failure: Secondary | ICD-10-CM

## 2014-03-05 DIAGNOSIS — I498 Other specified cardiac arrhythmias: Secondary | ICD-10-CM

## 2014-03-05 DIAGNOSIS — R000 Tachycardia, unspecified: Secondary | ICD-10-CM

## 2014-03-05 LAB — MDC_IDC_ENUM_SESS_TYPE_INCLINIC
Battery Impedance: 100 Ohm
Battery Remaining Longevity: 126 mo
Battery Voltage: 2.8 V
Brady Statistic AP VP Percent: 53 %
Brady Statistic AP VS Percent: 43 %
Brady Statistic AS VP Percent: 4 %
Brady Statistic AS VS Percent: 1 %
Date Time Interrogation Session: 20150402113047
Lead Channel Impedance Value: 465 Ohm
Lead Channel Impedance Value: 542 Ohm
Lead Channel Pacing Threshold Amplitude: 0.75 V
Lead Channel Pacing Threshold Amplitude: 0.75 V
Lead Channel Pacing Threshold Pulse Width: 0.4 ms
Lead Channel Pacing Threshold Pulse Width: 0.4 ms
Lead Channel Sensing Intrinsic Amplitude: 2 mV
Lead Channel Sensing Intrinsic Amplitude: 8 mV
Lead Channel Setting Pacing Amplitude: 2 V
Lead Channel Setting Pacing Amplitude: 2.5 V
Lead Channel Setting Pacing Pulse Width: 0.4 ms
Lead Channel Setting Sensing Sensitivity: 4 mV

## 2014-03-05 NOTE — Progress Notes (Signed)
Patient Care Team: Keith Rake as PCP - General (Family Medicine)   HPI  [name] [name] is a 79 y.o. female Seen in followup for pacemaker implanted for sinus node dysfunction and paroxysmal fibrillation. She underwent device generator replacement 12/14. She has a history of syncope thought to be vasovagal  Has a remote history of catheterization stress testing which as best as I can discern was unilluminating  She is feeling considerably better since device generator was replaced  Sh denies exercise intolerance or peripheral edema  Past Medical History  Diagnosis Date  . Sinoatrial node dysfunction     s/p PPM implantation  . Atrial fibrillation   . Unspecified essential hypertension   . Esophageal reflux   . Unspecified glaucoma   . Obese   . History of UTI     "don't get them often" (12/01/2013  . Syncope and collapse   . Hyperlipidemia   . Family history of anesthesia complication     "when they put sister asleep she stopped breathing; her oxygen level drops" (12/01/2013)  . OSA on CPAP   . Type II diabetes mellitus   . Anemia   . H/O hiatal hernia   . Arthritis     "shoulders" (12/01/2013)  . Anxiety   . CHF (congestive heart failure) dx'd 10/2013    Past Surgical History  Procedure Laterality Date  . Vaginal hysterectomy  1970  . Incision and drainage of wound Right 03/1998    "leg; it was like a boil" (12/01/2013)  . Insert / replace / remove pacemaker  2008; 12/01/2013    MDT ADDRL1 pacemaker - gen change by Dr Caryl Comes 12/01/2013  . Dilation and curettage of uterus      Current Outpatient Prescriptions  Medication Sig Dispense Refill  . ACCU-CHEK AVIVA PLUS test strip 1 each as needed.       . ALPRAZolam (XANAX) 0.5 MG tablet Take 0.5 mg by mouth daily as needed.       Marland Kitchen atorvastatin (LIPITOR) 20 MG tablet Take 20 mg by mouth daily.      . Blood Glucose Monitoring Suppl (ACCU-CHEK AVIVA PLUS) W/DEVICE KIT daily.       Marland Kitchen diltiazem (CARDIZEM CD) 240 MG  24 hr capsule Take 1 capsule (240 mg total) by mouth daily.  30 capsule  6  . diltiazem (CARDIZEM CD) 240 MG 24 hr capsule TAKE 1 CAPSULE (240 MG TOTAL) BY MOUTH DAILY.  30 capsule  6  . esomeprazole (NEXIUM) 40 MG capsule Take 40 mg by mouth daily.       . furosemide (LASIX) 20 MG tablet Take 20 mg by mouth daily as needed for fluid.      Marland Kitchen glipiZIDE (GLUCOTROL) 10 MG tablet Take 10 mg by mouth 2 (two) times daily before a meal.      . metFORMIN (GLUCOPHAGE-XR) 500 MG 24 hr tablet 500 mg 2 (two) times daily.       Marland Kitchen MICROLET LANCETS MISC as needed.       . mometasone (ELOCON) 0.1 % cream Apply topically daily. Apply to ear as directed two times a day for 10 days as needed         . Multiple Vitamins-Minerals (CENTRUM SILVER PO) Take by mouth daily.      . Nebivolol HCl (BYSTOLIC) 20 MG TABS Take 1 tablet (20 mg total) by mouth 2 (two) times daily.  180 tablet  3  . olmesartan (BENICAR) 40 MG tablet Take  40 mg by mouth daily.      . Omega-3 Fatty Acids (FISH OIL) 1200 MG CAPS Take 1,200 mg by mouth daily.      Marland Kitchen SIMBRINZA 1-0.2 % SUSP Place 1 drop into both eyes 2 (two) times daily.       . travoprost, benzalkonium, (TRAVATAN) 0.004 % ophthalmic solution Place 1 drop into both eyes at bedtime.       Marland Kitchen warfarin (COUMADIN) 3 MG tablet Take 3-4.5 mg by mouth daily at 6 PM. Takes 54m on Tues, Thurs, Sat and Sun. Takes 4.555mon Mon, Wed and Friday.       No current facility-administered medications for this visit.    No Known Allergies  Review of Systems negative except from HPI and PMH  Physical Exam Ht '5\' 4"'  (1.626 m)  Wt 247 lb 12 oz (112.379 kg)  BMI 42.51 kg/m2 Well developed and morbidly obese in no acute distress HENT normal E scleral and icterus clear Neck Supple JVP flat; carotids brisk and full Clear to ausculation Device pocket well healed; without hematoma or erythema.  There is no tethering  regular rate and rhythm, no murmurs gallops or rub Soft with active bowel  sounds No clubbing cyanosis  Edema Alert and oriented, grossly normal motor and sensory function Skin Warm and Dry  ECG demonstrates atrial pacing with a PR interval of 450 ms  Assessment and  Plan  Sinus node dysfunction  First degree AV block  Atrial fibrillation-paroxysmal  HFpEF-chronic  Syncope-neurally mediated  Pacemaker-Medtronic The patient's device was interrogated.  The information was reviewed. No changes were made in the programming.    The patient is doing well following device generator replacement.  I am quite surprised that she does as well with a degree of first degree AV block. If she were to develop symptoms of exercise intolerance that with worsening I would consider shorten her AV delay.  She is euvolemic. We'll continue her current medications.

## 2014-03-05 NOTE — Patient Instructions (Signed)
Remote monitoring is used to monitor your Pacemaker of ICD from home. This monitoring reduces the number of office visits required to check your device to one time per year. It allows [place] to keep an eye on the functioning of your device to ensure it is working properly. You are scheduled for a device check from home on 06/08/14. You may send your transmission at any time that day. If you have a wireless device, the transmission will be sent automatically. After your physician reviews your transmission, you will receive a postcard with your next transmission date.  Your physician wants you to follow-up in: in December with Dr. Caryl Comes. You will receive a reminder letter in the mail two months in advance. If you don't receive a letter, please call our office to schedule the follow-up appointment.

## 2014-03-30 ENCOUNTER — Ambulatory Visit: Payer: Self-pay | Admitting: Family Medicine

## 2014-04-08 ENCOUNTER — Ambulatory Visit (INDEPENDENT_AMBULATORY_CARE_PROVIDER_SITE_OTHER): Payer: Medicare Other

## 2014-04-08 ENCOUNTER — Encounter: Payer: Self-pay | Admitting: Podiatry

## 2014-04-08 ENCOUNTER — Ambulatory Visit: Payer: Self-pay | Admitting: Podiatry

## 2014-04-08 ENCOUNTER — Ambulatory Visit (INDEPENDENT_AMBULATORY_CARE_PROVIDER_SITE_OTHER): Payer: Medicare Other | Admitting: Podiatry

## 2014-04-08 VITALS — BP 150/87 | HR 82 | Resp 16 | Ht 64.0 in | Wt 249.0 lb

## 2014-04-08 DIAGNOSIS — M778 Other enthesopathies, not elsewhere classified: Secondary | ICD-10-CM

## 2014-04-08 DIAGNOSIS — M7750 Other enthesopathy of unspecified foot: Secondary | ICD-10-CM

## 2014-04-08 DIAGNOSIS — M109 Gout, unspecified: Secondary | ICD-10-CM

## 2014-04-08 DIAGNOSIS — M779 Enthesopathy, unspecified: Secondary | ICD-10-CM

## 2014-04-08 NOTE — Progress Notes (Signed)
Flare up of the left great toe . It started four weeks ago.   I have reviewed her past medical history medications allergies surgeries social history and review of systems. Pulses remain strongly palpable bilateral. She has erythema and edema to the medial border of the first metatarsophalangeal joint of the left foot. She has some tenderness on range of motion of the first metatarsophalangeal joint left. Radiographic evaluation does not demonstrate any osseous abnormalities in this area.  Assessment: Gouty capsulitis first metatarsophalangeal joint left.  Plan: Injection about the first metatarsophalangeal joint today with Kenalog and local anesthetic we'll followup with her in the near future.

## 2014-04-22 ENCOUNTER — Ambulatory Visit: Payer: Medicare Other | Admitting: Podiatry

## 2014-05-10 ENCOUNTER — Other Ambulatory Visit: Payer: Self-pay | Admitting: Physician Assistant

## 2014-06-08 ENCOUNTER — Ambulatory Visit (INDEPENDENT_AMBULATORY_CARE_PROVIDER_SITE_OTHER): Payer: Medicare Other | Admitting: *Deleted

## 2014-06-08 ENCOUNTER — Encounter: Payer: Self-pay | Admitting: Internal Medicine

## 2014-06-08 ENCOUNTER — Telehealth: Payer: Self-pay | Admitting: Cardiology

## 2014-06-08 DIAGNOSIS — Z950 Presence of cardiac pacemaker: Secondary | ICD-10-CM

## 2014-06-08 DIAGNOSIS — I495 Sick sinus syndrome: Secondary | ICD-10-CM

## 2014-06-08 LAB — MDC_IDC_ENUM_SESS_TYPE_REMOTE
Battery Impedance: 100 Ohm
Battery Remaining Longevity: 126 mo
Battery Voltage: 2.79 V
Brady Statistic AP VP Percent: 59 %
Brady Statistic AP VS Percent: 31 %
Brady Statistic AS VP Percent: 8 %
Brady Statistic AS VS Percent: 2 %
Date Time Interrogation Session: 20150706140050
Lead Channel Impedance Value: 464 Ohm
Lead Channel Impedance Value: 555 Ohm
Lead Channel Pacing Threshold Amplitude: 0.5 V
Lead Channel Pacing Threshold Amplitude: 0.75 V
Lead Channel Pacing Threshold Pulse Width: 0.4 ms
Lead Channel Pacing Threshold Pulse Width: 0.4 ms
Lead Channel Sensing Intrinsic Amplitude: 8 mV
Lead Channel Setting Pacing Amplitude: 2 V
Lead Channel Setting Pacing Amplitude: 2.5 V
Lead Channel Setting Pacing Pulse Width: 0.4 ms
Lead Channel Setting Sensing Sensitivity: 2.8 mV

## 2014-06-08 NOTE — Progress Notes (Signed)
Remote pacemaker transmission.   

## 2014-06-08 NOTE — Telephone Encounter (Signed)
Spoke with pt and reminded pt of remote transmission that is due today. Pt verbalized understanding.   

## 2014-07-08 ENCOUNTER — Encounter: Payer: Self-pay | Admitting: Cardiology

## 2014-07-13 ENCOUNTER — Telehealth: Payer: Self-pay | Admitting: *Deleted

## 2014-07-13 NOTE — Telephone Encounter (Signed)
Been there before.  I have corns and calluses.  My feets are real sore and I'm Diabetic.

## 2014-07-13 NOTE — Telephone Encounter (Signed)
Spoke with pt and pt stated she made an appt to be seen.

## 2014-07-23 ENCOUNTER — Encounter: Payer: Self-pay | Admitting: Cardiovascular Disease

## 2014-07-23 ENCOUNTER — Ambulatory Visit (INDEPENDENT_AMBULATORY_CARE_PROVIDER_SITE_OTHER): Payer: Medicare Other | Admitting: Cardiovascular Disease

## 2014-07-23 VITALS — BP 138/82 | HR 81 | Ht 66.0 in | Wt 248.2 lb

## 2014-07-23 DIAGNOSIS — I4891 Unspecified atrial fibrillation: Secondary | ICD-10-CM

## 2014-07-23 DIAGNOSIS — R5381 Other malaise: Secondary | ICD-10-CM

## 2014-07-23 DIAGNOSIS — Z950 Presence of cardiac pacemaker: Secondary | ICD-10-CM

## 2014-07-23 DIAGNOSIS — R002 Palpitations: Secondary | ICD-10-CM

## 2014-07-23 DIAGNOSIS — R000 Tachycardia, unspecified: Secondary | ICD-10-CM

## 2014-07-23 DIAGNOSIS — I480 Paroxysmal atrial fibrillation: Secondary | ICD-10-CM

## 2014-07-23 DIAGNOSIS — I509 Heart failure, unspecified: Secondary | ICD-10-CM

## 2014-07-23 DIAGNOSIS — I5033 Acute on chronic diastolic (congestive) heart failure: Secondary | ICD-10-CM

## 2014-07-23 DIAGNOSIS — E119 Type 2 diabetes mellitus without complications: Secondary | ICD-10-CM

## 2014-07-23 DIAGNOSIS — I10 Essential (primary) hypertension: Secondary | ICD-10-CM

## 2014-07-23 DIAGNOSIS — R5383 Other fatigue: Secondary | ICD-10-CM

## 2014-07-23 DIAGNOSIS — R42 Dizziness and giddiness: Secondary | ICD-10-CM

## 2014-07-23 NOTE — Assessment & Plan Note (Signed)
Blood pressure is well controlled on today's visit. No changes made to the medications. 

## 2014-07-23 NOTE — Patient Instructions (Signed)
You are doing well. No medication changes were made.  Please call if you would like to see Dr. Caryl Comes earlier for heart arrhythmia  Please call us if you have new issues that need to be addressed before your next appt.  Your physician wants you to follow-up in: 6 months.  You will receive a reminder letter in the mail two months in advance. If you don't receive a letter, please call our office to schedule the follow-up appointment.

## 2014-07-23 NOTE — Assessment & Plan Note (Signed)
She appears relatively euvolemic on today's visit. No changes made to her medications

## 2014-07-23 NOTE — Progress Notes (Signed)
Patient ID: [name] [name], female    DOB: 02-25-1935, 79 y.o.   MRN: [id number]  HPI Comments: [name] [name] is a 79 yo woman with a history of atrial fibrillation, symptomatic palpitations/PVCs, status post pacemaker implantation for sick sinus syndrome/tachybradycardia syndrome with chronic symptoms of palpitations, shortness of breath with exertion, who presents for routine followup. History of chronic fatigue  On her last clinic visit, her pacer was in her life and she had converted to VVI. This was reprogrammed  she felt better. Scheduled for pacer change out on 12/01/2013. Since her generator change in December 2014 she has been feeling well though recently reports having episodes where he feels pounding in her head, possibly in her chest. She has a high-pitched wine in her ear sometimes, sometimes has a tachycardia. Reports that she has seen ear nose throat in the past and they did not find anything wrong Evaluation of her device did show frequent APCs, 1.5% of the time was in atrial fibrillation. She does report having muscle cramps under breasts at times  EKG today shows ventricular paced rhythm Recent lab work June 2013 showing total cholesterol 155, LDL 82, HDL 56  Previous hospital admission on 09/24/2013 for acute on chronic diastolic CHF. She was drinking a significant amount of fluids. She was treated with IV diuretics with improvement of her symptoms. Was recommended that she go home with Lasix every other day. She had a stress test while in the hospital showing no ischemia. Echocardiogram dated 09/24/2013 showing ejection fraction 50-55%,, mild to moderate tricuspid regurg, mild to moderate MR, normal RV function, normal right ventricular systolic pressure   ER again on 10/18/2013.   Previous hx of shortness of breath and palpitations.  May 2012, she had adjustments to her pacemaker and since that time has felt back to normal.   she has had long hx of hot flashes, and a racing heart  rate when she is in bed. Recently this has been well-controlled on high-dose bystolic.   chronic back and hip pain. She did receive a cortisone shot on the left with no significant improvement. She uses her CPAP  She reports that her sugars have been well-controlled, blood pressure well controlled on current medications   syncope episode August 2013 .  in her bedroom folding clothes and the next thing she knew she found herself on the floor in the bathroom. She spent 2 days in the hospital, then at least 10 days in rehabilitation for significant left arm bruising and she was on warfarin. Pacemaker was interrogated after the fact showing 85% atrial paced rhythm, no other arrhythmia. Etiology concerning for vasovagal episode, though exact cause is uncertain.  She has chronic buzzing in her ear. She does report this is a chronic issue going back many years. Prior workup with ear nose throat. She does have prior exposure to loud noises, worked in a [employer].   cardiac catheterization in 2004, stress testing,   echo  showed normal systolic function, mild LVH, diastolic relaxation abnormality, mildly elevated right ventricular systolic pressures, mild MR.  treadmill study showing normal blood pressure response to exercise, chronotropic incompetence with peak heart rate of 100 beats per minute, resting heart rate in the 70s. Previous  PFTs  demonstrated extrinsic compression/restrictive physiology.   Total cholesterol 155, LDL 84, hemoglobin A1c 6.5 EKG shows ventricular  paced rhythm rate 81 beats per minute, this is different from her EKG in April 2015 which was a narrow complex rhythm  Outpatient Encounter Prescriptions as of 07/23/2014  Medication Sig  . ACCU-CHEK AVIVA PLUS test strip 1 each as needed.   . ALPRAZolam (XANAX) 0.5 MG tablet Take 0.5 mg by mouth daily as needed.   Marland Kitchen atorvastatin (LIPITOR) 20 MG tablet Take 20 mg by mouth daily.  . Blood Glucose Monitoring Suppl (ACCU-CHEK  AVIVA PLUS) W/DEVICE KIT daily.   Marland Kitchen BYSTOLIC 10 MG tablet TAKE 2 TABLETS TWICE A DAY  . diltiazem (CARDIZEM CD) 240 MG 24 hr capsule Take 1 capsule (240 mg total) by mouth daily.  Marland Kitchen esomeprazole (NEXIUM) 40 MG capsule Take 40 mg by mouth daily.   . furosemide (LASIX) 20 MG tablet Take 20 mg by mouth daily as needed for fluid.  Marland Kitchen glipiZIDE (GLUCOTROL) 10 MG tablet Take 10 mg by mouth 2 (two) times daily before a meal.  . metFORMIN (GLUCOPHAGE-XR) 500 MG 24 hr tablet 500 mg 2 (two) times daily.   Marland Kitchen MICROLET LANCETS MISC as needed.   . mometasone (ELOCON) 0.1 % cream Apply topically daily. Apply to ear as directed two times a day for 10 days as needed     . Multiple Vitamins-Minerals (CENTRUM SILVER PO) Take by mouth daily.  . Nebivolol HCl (BYSTOLIC) 20 MG TABS Take 1 tablet (20 mg total) by mouth 2 (two) times daily.  Marland Kitchen olmesartan (BENICAR) 40 MG tablet Take 40 mg by mouth daily.  . Omega-3 Fatty Acids (FISH OIL) 1200 MG CAPS Take 1,200 mg by mouth daily.  Marland Kitchen SIMBRINZA 1-0.2 % SUSP Place 1 drop into both eyes 2 (two) times daily.   . travoprost, benzalkonium, (TRAVATAN) 0.004 % ophthalmic solution Place 1 drop into both eyes at bedtime.   Marland Kitchen warfarin (COUMADIN) 3 MG tablet Take 3-4.5 mg by mouth daily at 6 PM. Takes 54m on Tues, Thurs, Sat and Sun. Takes 4.553mon Mon, Wed and Friday.    Review of Systems  Constitutional: Negative.   HENT: Negative.   Eyes: Negative.   Respiratory: Negative.   Cardiovascular: Negative.   Gastrointestinal: Negative.   Endocrine: Negative.   Musculoskeletal: Positive for arthralgias, back pain, gait problem and myalgias.  Skin: Negative.   Allergic/Immunologic: Negative.   Neurological: Positive for dizziness.       Pounding, pulsating feeling in her head at times  Hematological: Negative.   Psychiatric/Behavioral: Negative.   All other systems reviewed and are negative.   BP 138/82  Pulse 81  Ht '5\' 6"'  (1.676 m)  Wt 248 lb 4 oz (112.605 kg)  BMI  40.09 kg/m2  Physical Exam  Nursing note and vitals reviewed. Constitutional: She is oriented to person, place, and time. She appears well-developed and well-nourished.  obese  HENT:  Head: Normocephalic.  Nose: Nose normal.  Mouth/Throat: Oropharynx is clear and moist.  Eyes: Conjunctivae are normal. Pupils are equal, round, and reactive to light.  Neck: Normal range of motion. Neck supple. No JVD present.  Cardiovascular: Normal rate, regular rhythm, S1 normal, S2 normal, normal heart sounds and intact distal pulses.  Exam reveals no gallop and no friction rub.   No murmur heard. Pulmonary/Chest: Effort normal and breath sounds normal. No respiratory distress. She has no wheezes. She has no rales. She exhibits no tenderness.  Abdominal: Soft. Bowel sounds are normal. She exhibits no distension. There is no tenderness.  Musculoskeletal: Normal range of motion. She exhibits no edema and no tenderness.  Lymphadenopathy:    She has no cervical adenopathy.  Neurological: She is alert and oriented to  person, place, and time. Coordination normal.  Skin: Skin is warm and dry. No rash noted. No erythema.  Psychiatric: She has a normal mood and affect. Her behavior is normal. Judgment and thought content normal.    Assessment and Plan

## 2014-07-23 NOTE — Assessment & Plan Note (Signed)
We have encouraged continued exercise, careful diet management in an effort to lose weight. 

## 2014-07-23 NOTE — Assessment & Plan Note (Signed)
Notes indicating she is in atrial fibrillation 1.5% of the time. Uncertain if she might benefit from flecainide twice a day for other antiarrhythmic. Will defer to EP

## 2014-07-23 NOTE — Assessment & Plan Note (Signed)
She does appreciate palpitations in her chest and pulsating in her head. Suspect this could be either from atrial fibrillation episodes or ventricularly paced rhythm.

## 2014-07-23 NOTE — Assessment & Plan Note (Signed)
Rhythm now appears to be a ventricular paced rhythm at 81 beats per minute which is different from April 2015 which was narrow complex AV paced. Difficult to determine if she is in atrial fibrillation. We will set her up with followup in EP clinic. Case discussed with Nevin Bloodgood.

## 2014-07-28 ENCOUNTER — Ambulatory Visit (INDEPENDENT_AMBULATORY_CARE_PROVIDER_SITE_OTHER): Payer: Medicare Other | Admitting: Internal Medicine

## 2014-07-28 ENCOUNTER — Encounter: Payer: Self-pay | Admitting: Internal Medicine

## 2014-07-28 VITALS — BP 143/85 | HR 84 | Ht 66.0 in | Wt 249.0 lb

## 2014-07-28 DIAGNOSIS — I480 Paroxysmal atrial fibrillation: Secondary | ICD-10-CM

## 2014-07-28 DIAGNOSIS — R002 Palpitations: Secondary | ICD-10-CM

## 2014-07-28 DIAGNOSIS — I498 Other specified cardiac arrhythmias: Secondary | ICD-10-CM

## 2014-07-28 DIAGNOSIS — I440 Atrioventricular block, first degree: Secondary | ICD-10-CM

## 2014-07-28 DIAGNOSIS — I4891 Unspecified atrial fibrillation: Secondary | ICD-10-CM

## 2014-07-28 LAB — MDC_IDC_ENUM_SESS_TYPE_INCLINIC
Battery Impedance: 109 Ohm
Battery Remaining Longevity: 122 mo
Battery Voltage: 2.79 V
Brady Statistic AP VP Percent: 59 %
Brady Statistic AP VS Percent: 32 %
Brady Statistic AS VP Percent: 7 %
Brady Statistic AS VS Percent: 1 %
Date Time Interrogation Session: 20150825085726
Lead Channel Impedance Value: 465 Ohm
Lead Channel Impedance Value: 546 Ohm
Lead Channel Pacing Threshold Amplitude: 0.5 V
Lead Channel Pacing Threshold Amplitude: 0.75 V
Lead Channel Pacing Threshold Pulse Width: 0.4 ms
Lead Channel Pacing Threshold Pulse Width: 0.4 ms
Lead Channel Sensing Intrinsic Amplitude: 2 mV
Lead Channel Sensing Intrinsic Amplitude: 8 mV
Lead Channel Setting Pacing Amplitude: 2 V
Lead Channel Setting Pacing Amplitude: 2.5 V
Lead Channel Setting Pacing Pulse Width: 0.4 ms
Lead Channel Setting Sensing Sensitivity: 4 mV

## 2014-07-28 MED ORDER — HYDROCHLOROTHIAZIDE 25 MG PO TABS: 25.0000 mg | ORAL_TABLET | Freq: Every day | ORAL | Status: DC

## 2014-07-28 NOTE — Progress Notes (Signed)
Patient Care Team: Orland Jarred, MD as PCP - General (Family Medicine)   HPI  KAHLANI GRABER is a 79 y.o. female Seen in followup for pacemaker implanted for sinus node dysfunction and paroxysmal fibrillation. She underwent device generator replacement 12/14. She has a history of syncope thought to be vasovagal  Has a remote history of catheterization stress testing which as best as I can discern was unilluminating  She is feeling considerably better since device generator was replaced    She notes some peripheral edema left greater than right. She takes her diuretics on an as-needed basis.  She is awakened at night repeatedly, once every week or so, with palpitations which are quite disruptive and after which she is unable to return to sleep. They typically persists about 5-10 minutes after she awakens.  Past Medical History  Diagnosis Date  . Sinoatrial node dysfunction     s/p PPM implantation  . Atrial fibrillation   . Unspecified essential hypertension   . Esophageal reflux   . Unspecified glaucoma   . Obese   . History of UTI     "don't get them often" (12/01/2013  . Syncope and collapse   . Hyperlipidemia   . Family history of anesthesia complication     "when they put sister asleep she stopped breathing; her oxygen level drops" (12/01/2013)  . OSA on CPAP   . Type II diabetes mellitus   . Anemia   . H/O hiatal hernia   . Arthritis     "shoulders" (12/01/2013)  . Anxiety   . Chronic diastolic heart failure dx'd 10/2013  . 1St degree AV block     PR 400 msec with Apacing  . Cardiac pacemaker - Medtronic 04/13/2009    Qualifier: Diagnosis of  By: Valeria Batman      Past Surgical History  Procedure Laterality Date  . Vaginal hysterectomy  1970  . Incision and drainage of wound Right 03/1998    "leg; it was like a boil" (12/01/2013)  . Insert / replace / remove pacemaker  2008; 12/01/2013    MDT ADDRL1 pacemaker - gen change by Dr Caryl Comes 12/01/2013  .  Dilation and curettage of uterus      Current Outpatient Prescriptions  Medication Sig Dispense Refill  . ACCU-CHEK AVIVA PLUS test strip 1 each as needed.       . ALPRAZolam (XANAX) 0.5 MG tablet Take 0.5 mg by mouth daily as needed.       Marland Kitchen atorvastatin (LIPITOR) 20 MG tablet Take 20 mg by mouth daily.      . Blood Glucose Monitoring Suppl (ACCU-CHEK AVIVA PLUS) W/DEVICE KIT daily.       Marland Kitchen esomeprazole (NEXIUM) 40 MG capsule Take 40 mg by mouth daily.       . furosemide (LASIX) 20 MG tablet Take 20 mg by mouth daily as needed for fluid.      Marland Kitchen glipiZIDE (GLUCOTROL) 10 MG tablet Take 10 mg by mouth 2 (two) times daily before a meal.      . metFORMIN (GLUCOPHAGE-XR) 500 MG 24 hr tablet 500 mg 2 (two) times daily.       Marland Kitchen MICROLET LANCETS MISC as needed.       . mometasone (ELOCON) 0.1 % cream Apply topically daily. Apply to ear as directed two times a day for 10 days as needed         . Multiple Vitamins-Minerals (CENTRUM SILVER PO) Take by mouth daily.      Marland Kitchen  Nebivolol HCl (BYSTOLIC) 20 MG TABS Take 1 tablet (20 mg total) by mouth 2 (two) times daily.  180 tablet  3  . olmesartan (BENICAR) 40 MG tablet Take 40 mg by mouth daily.      . Omega-3 Fatty Acids (FISH OIL) 1200 MG CAPS Take 1,200 mg by mouth daily.      Marland Kitchen SIMBRINZA 1-0.2 % SUSP Place 1 drop into both eyes 2 (two) times daily.       . travoprost, benzalkonium, (TRAVATAN) 0.004 % ophthalmic solution Place 1 drop into both eyes at bedtime.       Marland Kitchen warfarin (COUMADIN) 3 MG tablet Take 3-4.5 mg by mouth daily at 6 PM. Takes 65m on Tues, Thurs, Sat and Sun. Takes 4.57mon Mon, Wed and Friday.      . hydrochlorothiazide (HYDRODIURIL) 25 MG tablet Take 1 tablet (25 mg total) by mouth daily.  90 tablet  3   No current facility-administered medications for this visit.    No Known Allergies  Review of Systems negative except from HPI and PMH  Physical Exam BP 143/85  Pulse 84  Ht '5\' 6"'  (1.676 m)  Wt 249 lb (112.946 kg)  BMI  40.21 kg/m2 Well developed and morbidly obese in no acute distress HENT normal E scleral and icterus clear Neck Supple JVP flat; carotids brisk and full Clear to ausculation Device pocket well healed; without hematoma or erythema.  There is no tethering  regular rate and rhythm, no murmurs gallops or rub Soft with active bowel sounds No clubbing cyanosis  Left 1+ right traceEdema Alert and oriented, grossly normal motor and sensory function Skin Warm and Dry  ECG demonstrates atrial pacing with a PR interval of 450 ms  Assessment and  Plan  Sinus node dysfunction  First degree AV block  Atrial fibrillation-paroxysmal  hypertension  HFpEF-chronic  Nocturnal palpitations  Syncope-neurally mediated  Pacemaker-Medtronic The patient's device was interrogated.  The information was reviewed. The device was reprogrammed to the AV delay 150/130--200/180. She is now about 40% ventricularly paced.  We also decreased resting rate from 75--70.   She is mildly edematous. This may be related to her diltiazem. We will discontinue it. We'll add hydrochlorothiazide in its place. We'll check a metabolic profile in about 4 weeks' time. Notably renal function and potassium levels were normal essentially 8 months ago.  We'll also utilize a 30 day event recorder to try to elucidate the mechanism of her nocturnal palpitations. Whether they represent PVCs or ventricular pacing. She does not have frequent atrial fibrillation to explain  this.

## 2014-07-28 NOTE — Patient Instructions (Signed)
Your physician has recommended you make the following change in your medication:  Stop Diltiazem  Start HCTZ 25 mg once daily   Your physician recommends that you return for lab work in:  BMP in 6 weeks. Same day as your follow up appointment   Your physician recommends that you schedule a follow-up appointment in:  6 weeks with Dr. Caryl Comes   Your physician has recommended that you wear a 30 day event monitor. Event monitors are medical devices that record the heart's electrical activity. Doctors most often [place] these monitors to diagnose arrhythmias. Arrhythmias are problems with the speed or rhythm of the heartbeat. The monitor is a small, portable device. You can wear one while you do your normal daily activities. This is usually used to diagnose what is causing palpitations/syncope (passing out).

## 2014-07-29 ENCOUNTER — Ambulatory Visit (INDEPENDENT_AMBULATORY_CARE_PROVIDER_SITE_OTHER): Payer: Medicare Other | Admitting: Podiatry

## 2014-07-29 DIAGNOSIS — B351 Tinea unguium: Secondary | ICD-10-CM

## 2014-07-29 DIAGNOSIS — M79609 Pain in unspecified limb: Secondary | ICD-10-CM

## 2014-07-29 DIAGNOSIS — Q828 Other specified congenital malformations of skin: Secondary | ICD-10-CM

## 2014-07-29 DIAGNOSIS — E119 Type 2 diabetes mellitus without complications: Secondary | ICD-10-CM

## 2014-07-29 NOTE — Progress Notes (Signed)
She presents today with a chief complaint of painful elongated toenails with painful corns and calluses.  Objective: Pulses are palpable bilateral. Nails are thick yellow dystrophic onychomycotic reactive hyperkeratosis particularly overlying the fifth PIPJ is bilateral.  Assessment: Pain in limb secondary to onychomycosis 1 through 5 bilateral. Corns and calluses plantar aspect of the bilateral foot and fifth toes.  Plan: Debridement of reactive hyperkeratosis bilateral debridement of nails 1 through 5 bilateral covered service secondary to pain.

## 2014-08-03 ENCOUNTER — Observation Stay: Payer: Self-pay | Admitting: Internal Medicine

## 2014-08-03 ENCOUNTER — Telehealth: Payer: Self-pay | Admitting: Physician Assistant

## 2014-08-03 LAB — COMPREHENSIVE METABOLIC PANEL
Albumin: 3.4 g/dL (ref 3.4–5.0)
Alkaline Phosphatase: 88 U/L
Anion Gap: 9 (ref 7–16)
BUN: 18 mg/dL (ref 7–18)
Bilirubin,Total: 0.5 mg/dL (ref 0.2–1.0)
Calcium, Total: 9.5 mg/dL (ref 8.5–10.1)
Chloride: 102 mmol/L (ref 98–107)
Co2: 27 mmol/L (ref 21–32)
Creatinine: 1.67 mg/dL — ABNORMAL HIGH (ref 0.60–1.30)
EGFR (African American): 34 — ABNORMAL LOW
EGFR (Non-African Amer.): 29 — ABNORMAL LOW
Glucose: 199 mg/dL — ABNORMAL HIGH (ref 65–99)
Osmolality: 283 (ref 275–301)
Potassium: 3.1 mmol/L — ABNORMAL LOW (ref 3.5–5.1)
SGOT(AST): 22 U/L (ref 15–37)
SGPT (ALT): 22 U/L
Sodium: 138 mmol/L (ref 136–145)
Total Protein: 7.3 g/dL (ref 6.4–8.2)

## 2014-08-03 LAB — CK
CK, Total: 102 U/L
CK, Total: 96 U/L

## 2014-08-03 LAB — URINALYSIS, COMPLETE
Bilirubin,UR: NEGATIVE
Blood: NEGATIVE
Glucose,UR: NEGATIVE mg/dL (ref 0–75)
Ketone: NEGATIVE
Leukocyte Esterase: NEGATIVE
Nitrite: NEGATIVE
Ph: 6 (ref 4.5–8.0)
Protein: NEGATIVE
RBC,UR: 1 /HPF (ref 0–5)
Specific Gravity: 1.004 (ref 1.003–1.030)
Squamous Epithelial: NONE SEEN
WBC UR: 2 /HPF (ref 0–5)

## 2014-08-03 LAB — CK TOTAL AND CKMB (NOT AT ARMC)
CK, Total: 97 U/L
CK-MB: 0.6 ng/mL (ref 0.5–3.6)

## 2014-08-03 LAB — TROPONIN I
Troponin-I: <0.02 ng/mL
Troponin-I: <0.02 ng/mL
Troponin-I: <0.02 ng/mL

## 2014-08-03 LAB — CBC
HCT: 34.5 % — ABNORMAL LOW (ref 35.0–47.0)
HGB: 10.9 g/dL — ABNORMAL LOW (ref 12.0–16.0)
MCH: 22.2 pg — ABNORMAL LOW (ref 26.0–34.0)
MCHC: 31.6 g/dL — ABNORMAL LOW (ref 32.0–36.0)
MCV: 70 fL — ABNORMAL LOW (ref 80–100)
Platelet: 230 10*3/uL (ref 150–440)
RBC: 4.9 10*6/uL (ref 3.80–5.20)
RDW: 16.6 % — ABNORMAL HIGH (ref 11.5–14.5)
WBC: 8.5 10*3/uL (ref 3.6–11.0)

## 2014-08-03 LAB — PRO B NATRIURETIC PEPTIDE: B-Type Natriuretic Peptide: 673 pg/mL — ABNORMAL HIGH (ref 0–450)

## 2014-08-03 LAB — PROTIME-INR
INR: 1.7
Prothrombin Time: 19.4 secs — ABNORMAL HIGH (ref 11.5–14.7)

## 2014-08-03 LAB — LIPASE, BLOOD: Lipase: 130 U/L (ref 73–393)

## 2014-08-03 NOTE — Telephone Encounter (Signed)
[name] [name] is in [facility] ER, with headaches and irregular heart rate, the niece feels they are not being told anything, she is concerned. Her mother and sister are on their way.  Advised her that medications to control her heart rate are standard and the ER staff will use them. Advised her that cardiology is available (mostly by phone) after hours and if they need [place], they will call. Advised her that to have family there to talk with caregivers and express their concerns is the best way to get the questions answered. She appreciated the information.

## 2014-08-04 ENCOUNTER — Telehealth: Payer: Self-pay

## 2014-08-04 ENCOUNTER — Other Ambulatory Visit: Payer: Self-pay | Admitting: Nurse Practitioner

## 2014-08-04 ENCOUNTER — Encounter: Payer: Self-pay | Admitting: Nurse Practitioner

## 2014-08-04 DIAGNOSIS — R5383 Other fatigue: Secondary | ICD-10-CM

## 2014-08-04 DIAGNOSIS — I4891 Unspecified atrial fibrillation: Secondary | ICD-10-CM

## 2014-08-04 DIAGNOSIS — R079 Chest pain, unspecified: Secondary | ICD-10-CM

## 2014-08-04 DIAGNOSIS — R5381 Other malaise: Secondary | ICD-10-CM

## 2014-08-04 DIAGNOSIS — N183 Chronic kidney disease, stage 3 unspecified: Secondary | ICD-10-CM

## 2014-08-04 LAB — LIPID PANEL
Cholesterol: 148 mg/dL (ref 0–200)
HDL Cholesterol: 47 mg/dL (ref 40–60)
Ldl Cholesterol, Calc: 85 mg/dL (ref 0–100)
Triglycerides: 80 mg/dL (ref 0–200)
VLDL Cholesterol, Calc: 16 mg/dL (ref 5–40)

## 2014-08-04 LAB — PROTIME-INR
INR: 1.6
Prothrombin Time: 18.9 secs — ABNORMAL HIGH (ref 11.5–14.7)

## 2014-08-04 LAB — POTASSIUM: Potassium: 3.9 mmol/L (ref 3.5–5.1)

## 2014-08-04 NOTE — Telephone Encounter (Signed)
Attempted to contact pt regarding discharge from [facility] on 08/04/14. No answer, no machine.

## 2014-08-05 ENCOUNTER — Telehealth: Payer: Self-pay | Admitting: *Deleted

## 2014-08-05 LAB — PROTIME-INR
INR: 1.9
Prothrombin Time: 21.1 secs — ABNORMAL HIGH (ref 11.5–14.7)

## 2014-08-05 NOTE — Telephone Encounter (Signed)
lmtcb concerning [employer] monitor. Pt has been contacted several times by [employer] to verify address for shipping. Pt has not contacted or spoken with [employer] to receive monitor. [employer] will not ship until verified address.

## 2014-08-11 ENCOUNTER — Encounter: Payer: Medicare Other | Admitting: Nurse Practitioner

## 2014-08-14 ENCOUNTER — Ambulatory Visit: Payer: Medicare Other | Admitting: Cardiovascular Disease

## 2014-09-08 ENCOUNTER — Ambulatory Visit (INDEPENDENT_AMBULATORY_CARE_PROVIDER_SITE_OTHER): Payer: Medicare Other | Admitting: Internal Medicine

## 2014-09-08 ENCOUNTER — Other Ambulatory Visit: Payer: Medicare Other

## 2014-09-08 ENCOUNTER — Encounter: Payer: Self-pay | Admitting: Internal Medicine

## 2014-09-08 VITALS — BP 152/78 | HR 86 | Ht 65.0 in | Wt 243.0 lb

## 2014-09-08 DIAGNOSIS — R55 Syncope and collapse: Secondary | ICD-10-CM

## 2014-09-08 DIAGNOSIS — I480 Paroxysmal atrial fibrillation: Secondary | ICD-10-CM

## 2014-09-08 DIAGNOSIS — I4891 Unspecified atrial fibrillation: Secondary | ICD-10-CM

## 2014-09-08 DIAGNOSIS — Z45018 Encounter for adjustment and management of other part of cardiac pacemaker: Secondary | ICD-10-CM

## 2014-09-08 DIAGNOSIS — I5030 Unspecified diastolic (congestive) heart failure: Secondary | ICD-10-CM

## 2014-09-08 DIAGNOSIS — I10 Essential (primary) hypertension: Secondary | ICD-10-CM

## 2014-09-08 DIAGNOSIS — J018 Other acute sinusitis: Secondary | ICD-10-CM

## 2014-09-08 DIAGNOSIS — I509 Heart failure, unspecified: Secondary | ICD-10-CM

## 2014-09-08 LAB — MDC_IDC_ENUM_SESS_TYPE_INCLINIC
Battery Impedance: 100 Ohm
Battery Remaining Longevity: 126 mo
Battery Voltage: 2.79 V
Brady Statistic AP VP Percent: 67 %
Brady Statistic AP VS Percent: 26 %
Brady Statistic AS VP Percent: 5 %
Brady Statistic AS VS Percent: 3 %
Date Time Interrogation Session: 20151006093208
Lead Channel Impedance Value: 459 Ohm
Lead Channel Impedance Value: 572 Ohm
Lead Channel Pacing Threshold Amplitude: 0.5 V
Lead Channel Pacing Threshold Amplitude: 0.75 V
Lead Channel Pacing Threshold Pulse Width: 0.4 ms
Lead Channel Pacing Threshold Pulse Width: 0.4 ms
Lead Channel Sensing Intrinsic Amplitude: 2 mV
Lead Channel Sensing Intrinsic Amplitude: 8 mV
Lead Channel Setting Pacing Amplitude: 2 V
Lead Channel Setting Pacing Amplitude: 2.5 V
Lead Channel Setting Pacing Pulse Width: 0.4 ms
Lead Channel Setting Sensing Sensitivity: 2.8 mV

## 2014-09-08 NOTE — Patient Instructions (Signed)
Remote monitoring is used to monitor your Pacemaker of ICD from home. This monitoring reduces the number of office visits required to check your device to one time per year. It allows [place] to keep an eye on the functioning of your device to ensure it is working properly. You are scheduled for a device check from home on 12/09/14. You may send your transmission at any time that day. If you have a wireless device, the transmission will be sent automatically. After your physician reviews your transmission, you will receive a postcard with your next transmission date.  Your physician wants you to follow-up in: August 2016 with Dr. Caryl Comes. You will receive a reminder letter in the mail two months in advance. If you don't receive a letter, please call our office to schedule the follow-up appointment.

## 2014-09-08 NOTE — Progress Notes (Addendum)
Patient Care Team: Orland Jarred, MD as PCP - General (Family Medicine)   HPI  [name] [name] is a 79 y.o. female Seen in followup for pacemaker implanted for sinus node dysfunction and paroxysmal fibrillation. She underwent device generator replacement 12/14. She has a history of syncope thought to be vasovagal  When seen in august 2015 c/o nocturnal  palpitations which are quite disruptive and after which she is unable to return to sleep. They typically persists about 5-10 minutes after she awakens   She was recently admitted with fatigue. Her K was 3.1 as I had added HCTZ to what I understood was prn furosemide but apparently was taking more frequently  NO evidence of HF or ischemia at that time  Amlodipine was added;  She continues with chronic edema  Not worsened   Has a remote history of catheterization stress testing which as best as I can discern was unilluminating       Past Medical History  Diagnosis Date  . SSS (sick sinus syndrome)     a. 2008 s/p MDT PPM;  b. 11/2013 Gen change- MDT ADDRL1 Adapta DC PPM, ser # [id number] H.  Marland Kitchen PAF (paroxysmal atrial fibrillation)   . Unspecified essential hypertension   . Esophageal reflux   . Unspecified glaucoma   . Morbid obesity   . History of UTI   . Syncope and collapse     a. Felt to be vasovagal.  . Hyperlipidemia   . OSA on CPAP   . Type II diabetes mellitus   . Anemia   . H/O hiatal hernia   . Arthritis     a. shoulders.  . Anxiety   . Chronic diastolic heart failure     a. 09/2013 Echo: EF 50-55%, mild LVH, mild to mod MR/TR.  . 1St degree AV block     a. PR 400 msec with Apacing  . Symptomatic PVCs   . Chest pain     a. 2004 Cath:  reportedly nl;  b. 09/2013 Neg MV.    Past Surgical History  Procedure Laterality Date  . Vaginal hysterectomy  1970  . Incision and drainage of wound Right 03/1998    "leg; it was like a boil" (12/01/2013)  . Insert / replace / remove pacemaker  2008; 12/01/2013    MDT  ADDRL1 pacemaker - gen change by Dr Caryl Comes 12/01/2013  . Dilation and curettage of uterus      Current Outpatient Prescriptions  Medication Sig Dispense Refill  . amLODipine (NORVASC) 5 MG tablet Take 5 mg by mouth daily.      Marland Kitchen ACCU-CHEK AVIVA PLUS test strip 1 each as needed.       . ALPRAZolam (XANAX) 0.5 MG tablet Take 0.5 mg by mouth daily as needed.       Marland Kitchen atorvastatin (LIPITOR) 20 MG tablet Take 20 mg by mouth daily.      . Blood Glucose Monitoring Suppl (ACCU-CHEK AVIVA PLUS) W/DEVICE KIT daily.       Marland Kitchen esomeprazole (NEXIUM) 40 MG capsule Take 40 mg by mouth daily.       . furosemide (LASIX) 20 MG tablet Take 20 mg by mouth daily as needed for fluid.      Marland Kitchen glipiZIDE (GLUCOTROL) 10 MG tablet Take 10 mg by mouth 2 (two) times daily before a meal.      . metFORMIN (GLUCOPHAGE-XR) 500 MG 24 hr tablet 500 mg 2 (two) times daily.       Marland Kitchen  MICROLET LANCETS MISC as needed.       . mometasone (ELOCON) 0.1 % cream Apply topically daily. Apply to ear as directed two times a day for 10 days as needed         . Multiple Vitamins-Minerals (CENTRUM SILVER PO) Take by mouth daily.      . Nebivolol HCl (BYSTOLIC) 20 MG TABS Take 1 tablet (20 mg total) by mouth 2 (two) times daily.  180 tablet  3  . olmesartan (BENICAR) 40 MG tablet Take 40 mg by mouth daily.      . Omega-3 Fatty Acids (FISH OIL) 1200 MG CAPS Take 1,200 mg by mouth daily.      Marland Kitchen SIMBRINZA 1-0.2 % SUSP Place 1 drop into both eyes 2 (two) times daily.       . travoprost, benzalkonium, (TRAVATAN) 0.004 % ophthalmic solution Place 1 drop into both eyes at bedtime.       Marland Kitchen warfarin (COUMADIN) 3 MG tablet Take 3-4.5 mg by mouth daily at 6 PM. Takes 92m on Tues, Thurs, Sat and Sun. Takes 4.540mon Mon, Wed and Friday.       No current facility-administered medications for this visit.    No Known Allergies  Review of Systems negative except from HPI and PMH  Physical Exam BP 152/78  Ht '5\' 5"'  (1.651 m)  Wt 243 lb (110.224 kg)  BMI  40.44 kg/m2 Well developed and morbidly obese in no acute distress HENT normal E scleral and icterus clear Neck Supple JVP 7-8  carotids brisk and full Clear to ausculation Device pocket well healed; without hematoma or erythema.  There is no tethering  regular rate and rhythm, no murmurs gallops or rub Soft with active bowel sounds No clubbing cyanosis  Left 1+ right traceEdema Alert and oriented, grossly normal motor and sensory function Skin Warm and Dry  ECG demonstrates atrial pacing with a PR interval of 450 ms TWI V2-V6  Assessment and  Plan  Sinus node dysfunction  First degree AV block  Atrial fibrillation-paroxysmal  hypertension  HFpEF-chronic  Nocturnal palpitations  Syncope-neurally mediated  Sinusitis  Pacemaker-[employer]tronic The patient's device was interrogated.  The information was reviewed. The device was reprogrammed to the AV delay 150/130--200/180. She is now about 40% ventricularly paced.  We also decreased resting rate from 75--70.   She is mildly edematous. This may be related to her amlodipine  Given the results of my previous attempt to make this better which failed miserably, will leave well enough alone  If her palps worsen we will  utilize a 30 day event recorder to try to elucidate the mechanism of her nocturnal palpitations.  BP is elevated  She will follow with her PCP  Have asked her to follwoup with PCP with malodorous nasal discharge   hosptinal records reviewed

## 2014-09-15 DIAGNOSIS — K5090 Crohn's disease, unspecified, without complications: Secondary | ICD-10-CM | POA: Insufficient documentation

## 2014-09-17 ENCOUNTER — Telehealth: Payer: Self-pay

## 2014-09-17 NOTE — Telephone Encounter (Signed)
Pt sched for colonoscopy/EGD on 10/08/14 @ [place]. Per Christell Faith, PA: "No change in anticoagulation is recommended for low risk procedures.  Recommend INR <2.5, this is not checked by [place].  Would pursue this prior to performing procedure." Pt has been cleared, pending INR check.  Faxed to [facility] at [phone].

## 2014-09-18 ENCOUNTER — Emergency Department: Payer: Self-pay | Admitting: Emergency Medicine

## 2014-11-05 ENCOUNTER — Ambulatory Visit (INDEPENDENT_AMBULATORY_CARE_PROVIDER_SITE_OTHER): Payer: Medicare Other | Admitting: Cardiovascular Disease

## 2014-11-05 ENCOUNTER — Encounter: Payer: Self-pay | Admitting: Cardiovascular Disease

## 2014-11-05 VITALS — BP 122/62 | HR 81 | Ht 65.0 in | Wt 246.0 lb

## 2014-11-05 DIAGNOSIS — R609 Edema, unspecified: Secondary | ICD-10-CM

## 2014-11-05 DIAGNOSIS — I499 Cardiac arrhythmia, unspecified: Secondary | ICD-10-CM

## 2014-11-05 DIAGNOSIS — Z950 Presence of cardiac pacemaker: Secondary | ICD-10-CM

## 2014-11-05 DIAGNOSIS — I480 Paroxysmal atrial fibrillation: Secondary | ICD-10-CM

## 2014-11-05 DIAGNOSIS — I10 Essential (primary) hypertension: Secondary | ICD-10-CM

## 2014-11-05 DIAGNOSIS — I5032 Chronic diastolic (congestive) heart failure: Secondary | ICD-10-CM

## 2014-11-05 MED ORDER — NEBIVOLOL HCL 20 MG PO TABS: 20.0000 mg | ORAL_TABLET | Freq: Two times a day (BID) | ORAL | Status: DC

## 2014-11-05 MED ORDER — NEBIVOLOL HCL 20 MG PO TABS: 40.0000 mg | ORAL_TABLET | Freq: Every day | ORAL | Status: DC

## 2014-11-05 NOTE — Assessment & Plan Note (Signed)
Blood pressure is well controlled on today's visit. No changes made to the medications. We will send in a new prescription for bystolic 40 mg daily (20 mg twice a day)

## 2014-11-05 NOTE — Assessment & Plan Note (Signed)
She appears relatively euvolemic on today's visit. Taking Lasix when necessary. Weight is stable. No significant symptoms of shortness of breath

## 2014-11-05 NOTE — Assessment & Plan Note (Signed)
She continues to have palpitations at nighttime. We have offered a 30 day monitor. She is declined at this time as symptoms are somewhat better Recommend that she call us if symptoms get worse

## 2014-11-05 NOTE — Patient Instructions (Addendum)
You are doing well. No medication changes were made.  Please call if the heart rhythm starts acting up, we would order a 30 day monitor  Please call us if you have new issues that need to be addressed before your next appt.  Your physician wants you to follow-up in: 6 months.  You will receive a reminder letter in the mail two months in advance. If you don't receive a letter, please call our office to schedule the follow-up appointment.

## 2014-11-05 NOTE — Assessment & Plan Note (Addendum)
Recent notes from Dr. Caryl Comes were reviewed. Recent changes to her pacemaker  Made. Overall she feels somewhat better Still with palpitations at nighttime when she turns over, describes it as a pounding in her chest for a 5 or 10 minute.

## 2014-11-05 NOTE — Assessment & Plan Note (Signed)
Minimal leg edema. Compression hose in place. We'll continue amlodipine

## 2014-11-05 NOTE — Progress Notes (Signed)
Patient ID: [name] [name], female    DOB: 07/09/1935, 79 y.o.   MRN: [id number]  HPI Comments: [name] [name] is a 79 yo woman with a history of atrial fibrillation, symptomatic palpitations/PVCs, status post pacemaker implantation for sick sinus syndrome/tachybradycardia syndrome with chronic symptoms of palpitations, shortness of breath with exertion, who presents for routine followup of her atrial fibrillation and shortness of breath History of chronic fatigue  In follow-up today, she reports that she is doing relatively well. Recent Pacer changes 10/15, feels better No significant leg swelling, still on amlodipine 5 mg daily In bed at night, when she is turning over, she has palpitations, rate high 80s, 5 to 10 min, then slows down Palpitations are little better recently Takes bystolic 20 BID Wears compressions on a regular basis  Previously on HCTZ, had abd cramps, ABD pains (now stopped) Finger fungus, on lamacil daily  EKG today shows ventricular paced rhythm, rate 81 bpm  Recent lab work June 2013 showing total cholesterol 155, LDL 82, HDL 56  Other past medical history Previous hospital admission on 09/24/2013 for acute on chronic diastolic CHF. She was drinking a significant amount of fluids. She was treated with IV diuretics with improvement of her symptoms. Was recommended that she go home with Lasix every other day. She had a stress test while in the hospital showing no ischemia. Echocardiogram dated 09/24/2013 showing ejection fraction 50-55%,, mild to moderate tricuspid regurg, mild to moderate MR, normal RV function, normal right ventricular systolic pressure   ER again on 10/18/2013.   Previous hx of shortness of breath and palpitations.  May 2012, she had adjustments to her pacemaker and since that time has felt back to normal.   she has had long hx of hot flashes, and a racing heart rate when she is in bed. Recently this has been well-controlled on high-dose bystolic.   chronic back and hip pain. She did receive a cortisone shot on the left with no significant improvement. She uses her CPAP  She reports that her sugars have been well-controlled, blood pressure well controlled on current medications   syncope episode August 2013 .  in her bedroom folding clothes and the next thing she knew she found herself on the floor in the bathroom. She spent 2 days in the hospital, then at least 10 days in rehabilitation for significant left arm bruising and she was on warfarin. Pacemaker was interrogated after the fact showing 85% atrial paced rhythm, no other arrhythmia. Etiology concerning for vasovagal episode, though exact cause is uncertain.  She has chronic buzzing in her ear. She does report this is a chronic issue going back many years. Prior workup with ear nose throat. She does have prior exposure to loud noises, worked in a [employer].   cardiac catheterization in 2004, stress testing,   echo  showed normal systolic function, mild LVH, diastolic relaxation abnormality, mildly elevated right ventricular systolic pressures, mild MR.  treadmill study showing normal blood pressure response to exercise, chronotropic incompetence with peak heart rate of 100 beats per minute, resting heart rate in the 70s. Previous  PFTs  demonstrated extrinsic compression/restrictive physiology.  Outpatient Encounter Prescriptions as of 11/05/2014  Medication Sig  . ACCU-CHEK AVIVA PLUS test strip 1 each as needed.   . ALPRAZolam (XANAX) 0.5 MG tablet Take 0.5 mg by mouth daily as needed.   Marland Kitchen amLODipine (NORVASC) 5 MG tablet Take 5 mg by mouth daily.  Marland Kitchen atorvastatin (LIPITOR) 20 MG tablet Take 20  mg by mouth daily.  . Blood Glucose Monitoring Suppl (ACCU-CHEK AVIVA PLUS) W/DEVICE KIT daily.   Marland Kitchen esomeprazole (NEXIUM) 40 MG capsule Take 40 mg by mouth daily.   . furosemide (LASIX) 20 MG tablet Take 20 mg by mouth daily as needed for fluid.  Marland Kitchen glipiZIDE (GLUCOTROL) 10 MG tablet Take 10  mg by mouth 2 (two) times daily before a meal.  . metFORMIN (GLUCOPHAGE-XR) 500 MG 24 hr tablet 500 mg 2 (two) times daily.   Marland Kitchen MICROLET LANCETS MISC as needed.   . mometasone (ELOCON) 0.1 % cream Apply topically daily. Apply to ear as directed two times a day for 10 days as needed     . Multiple Vitamins-Minerals (CENTRUM SILVER PO) Take by mouth daily.  . Nebivolol HCl (BYSTOLIC) 20 MG TABS Take 1 tablet (20 mg total) by mouth 2 (two) times daily.  Marland Kitchen olmesartan (BENICAR) 40 MG tablet Take 40 mg by mouth daily.  . Omega-3 Fatty Acids (FISH OIL) 1200 MG CAPS Take 1,200 mg by mouth daily.  Marland Kitchen SIMBRINZA 1-0.2 % SUSP Place 1 drop into both eyes 2 (two) times daily.   Marland Kitchen terbinafine (LAMISIL) 250 MG tablet Take 250 mg by mouth daily.  . travoprost, benzalkonium, (TRAVATAN) 0.004 % ophthalmic solution Place 1 drop into both eyes at bedtime.   Marland Kitchen warfarin (COUMADIN) 3 MG tablet Take 3-4.5 mg by mouth daily at 6 PM. Takes 7m on Tues, Thurs, Sat and Sun. Takes 4.514mon Mon, Wed and Friday.  . [DISCONTINUED] Nebivolol HCl (BYSTOLIC) 20 MG TABS Take 1 tablet (20 mg total) by mouth 2 (two) times daily.   Social history  reports that she quit smoking about 25 years ago. Her smoking use included Cigarettes. She has a 17 pack-year smoking history. She has never used smokeless tobacco. She reports that she does not drink alcohol or use illicit drugs.  Review of Systems  Constitutional: Negative.   Eyes: Negative.   Respiratory: Negative.   Cardiovascular: Positive for palpitations.  Gastrointestinal: Negative.   Musculoskeletal: Positive for myalgias, back pain, arthralgias and gait problem.  Skin: Negative.   Neurological: Positive for dizziness.  Hematological: Negative.   Psychiatric/Behavioral: Negative.   All other systems reviewed and are negative.   BP 122/62 mmHg  Pulse 81  Ht '5\' 5"'  (1.651 m)  Wt 246 lb (111.585 kg)  BMI 40.94 kg/m2  Physical Exam  Constitutional: She is oriented to  person, place, and time. She appears well-developed and well-nourished.  HENT:  Head: Normocephalic.  Nose: Nose normal.  Mouth/Throat: Oropharynx is clear and moist.  Eyes: Conjunctivae are normal. Pupils are equal, round, and reactive to light.  Neck: Normal range of motion. Neck supple. No JVD present.  Cardiovascular: Normal rate, regular rhythm, S1 normal, S2 normal, normal heart sounds and intact distal pulses.  Exam reveals no gallop and no friction rub.   No murmur heard. Pulmonary/Chest: Effort normal and breath sounds normal. No respiratory distress. She has no wheezes. She has no rales. She exhibits no tenderness.  Abdominal: Soft. Bowel sounds are normal. She exhibits no distension. There is no tenderness.  Musculoskeletal: Normal range of motion. She exhibits no edema or tenderness.  Lymphadenopathy:    She has no cervical adenopathy.  Neurological: She is alert and oriented to person, place, and time. Coordination normal.  Skin: Skin is warm and dry. No rash noted. No erythema.  Psychiatric: She has a normal mood and affect. Her behavior is normal. Judgment and thought content  normal.    Assessment and Plan  Nursing note and vitals reviewed.

## 2014-11-11 ENCOUNTER — Other Ambulatory Visit: Payer: Self-pay | Admitting: Cardiovascular Disease

## 2014-11-12 ENCOUNTER — Encounter (HOSPITAL_COMMUNITY): Payer: Self-pay | Admitting: Internal Medicine

## 2014-11-25 ENCOUNTER — Telehealth: Payer: Self-pay

## 2014-11-25 NOTE — Telephone Encounter (Signed)
Pt called and states that she has " the gout" in her toe and it is painful, she is requesting an appointment for an injection

## 2014-12-08 DIAGNOSIS — I4891 Unspecified atrial fibrillation: Secondary | ICD-10-CM | POA: Diagnosis not present

## 2014-12-08 DIAGNOSIS — N183 Chronic kidney disease, stage 3 (moderate): Secondary | ICD-10-CM | POA: Diagnosis not present

## 2014-12-08 DIAGNOSIS — M109 Gout, unspecified: Secondary | ICD-10-CM | POA: Diagnosis not present

## 2014-12-08 DIAGNOSIS — E1322 Other specified diabetes mellitus with diabetic chronic kidney disease: Secondary | ICD-10-CM | POA: Diagnosis not present

## 2014-12-08 DIAGNOSIS — E1169 Type 2 diabetes mellitus with other specified complication: Secondary | ICD-10-CM | POA: Diagnosis not present

## 2014-12-08 DIAGNOSIS — Z5181 Encounter for therapeutic drug level monitoring: Secondary | ICD-10-CM | POA: Diagnosis not present

## 2014-12-08 DIAGNOSIS — E785 Hyperlipidemia, unspecified: Secondary | ICD-10-CM | POA: Diagnosis not present

## 2014-12-09 ENCOUNTER — Ambulatory Visit (INDEPENDENT_AMBULATORY_CARE_PROVIDER_SITE_OTHER): Payer: Medicare Other | Admitting: *Deleted

## 2014-12-09 DIAGNOSIS — I495 Sick sinus syndrome: Secondary | ICD-10-CM

## 2014-12-09 NOTE — Progress Notes (Signed)
Remote pacemaker transmission.   

## 2014-12-10 DIAGNOSIS — L309 Dermatitis, unspecified: Secondary | ICD-10-CM | POA: Diagnosis not present

## 2014-12-10 DIAGNOSIS — B351 Tinea unguium: Secondary | ICD-10-CM | POA: Diagnosis not present

## 2014-12-11 LAB — MDC_IDC_ENUM_SESS_TYPE_REMOTE
Battery Impedance: 110 Ohm
Battery Remaining Longevity: 126 mo
Battery Voltage: 2.79 V
Brady Statistic AP VP Percent: 48 %
Brady Statistic AP VS Percent: 43 %
Brady Statistic AS VP Percent: 6 %
Brady Statistic AS VS Percent: 3 %
Date Time Interrogation Session: 20160106122041
Lead Channel Impedance Value: 439 Ohm
Lead Channel Impedance Value: 567 Ohm
Lead Channel Pacing Threshold Amplitude: 0.5 V
Lead Channel Pacing Threshold Amplitude: 0.75 V
Lead Channel Pacing Threshold Pulse Width: 0.4 ms
Lead Channel Pacing Threshold Pulse Width: 0.4 ms
Lead Channel Sensing Intrinsic Amplitude: 8 mV
Lead Channel Setting Pacing Amplitude: 2 V
Lead Channel Setting Pacing Amplitude: 2.5 V
Lead Channel Setting Pacing Pulse Width: 0.4 ms
Lead Channel Setting Sensing Sensitivity: 2.8 mV

## 2014-12-14 ENCOUNTER — Ambulatory Visit: Payer: Self-pay | Admitting: Family Medicine

## 2014-12-14 DIAGNOSIS — Z1231 Encounter for screening mammogram for malignant neoplasm of breast: Secondary | ICD-10-CM | POA: Diagnosis not present

## 2014-12-14 DIAGNOSIS — Z5181 Encounter for therapeutic drug level monitoring: Secondary | ICD-10-CM | POA: Diagnosis not present

## 2014-12-18 DIAGNOSIS — Z5181 Encounter for therapeutic drug level monitoring: Secondary | ICD-10-CM | POA: Diagnosis not present

## 2014-12-21 DIAGNOSIS — H4011X2 Primary open-angle glaucoma, moderate stage: Secondary | ICD-10-CM | POA: Diagnosis not present

## 2014-12-23 DIAGNOSIS — K5090 Crohn's disease, unspecified, without complications: Secondary | ICD-10-CM | POA: Diagnosis not present

## 2014-12-23 DIAGNOSIS — Z86010 Personal history of colonic polyps: Secondary | ICD-10-CM | POA: Diagnosis not present

## 2014-12-23 DIAGNOSIS — K219 Gastro-esophageal reflux disease without esophagitis: Secondary | ICD-10-CM | POA: Diagnosis not present

## 2014-12-31 ENCOUNTER — Telehealth: Payer: Self-pay

## 2014-12-31 NOTE — Telephone Encounter (Signed)
Received cardiac clearance request from [facility] for pt to proceed w/ colonoscopy on 01/22/15. Per Christell Faith, PA, pt has been cleared to proceed. "Pt possibly in and out of afib (has been having freq palps) V pacing last ov. Bridge w/ Lovenox 40 mg BID starting 3 days prior to procedure (discontinue 24 hr prior to procedure).  Last dose prior to procedure will be evening dose night before procedure.  May restart coumadin at current dose & Lovenox 24 hrs post procedure if hemostasis is obtained.  Continue lovenox 40 mg BID until INR 2-3, then stop Lovenox.  She will need coumadin clinic follow up and Lovenox Rx." Faxed to [facility] at [phone].

## 2015-01-05 DIAGNOSIS — Z1211 Encounter for screening for malignant neoplasm of colon: Secondary | ICD-10-CM | POA: Diagnosis not present

## 2015-01-08 DIAGNOSIS — K921 Melena: Secondary | ICD-10-CM | POA: Diagnosis not present

## 2015-01-08 DIAGNOSIS — Z79899 Other long term (current) drug therapy: Secondary | ICD-10-CM | POA: Diagnosis not present

## 2015-01-12 NOTE — Telephone Encounter (Signed)
This encounter was created in error - please disregard.

## 2015-01-22 ENCOUNTER — Ambulatory Visit (INDEPENDENT_AMBULATORY_CARE_PROVIDER_SITE_OTHER): Payer: Medicare Other | Admitting: Cardiovascular Disease

## 2015-01-22 ENCOUNTER — Encounter: Payer: Self-pay | Admitting: Cardiovascular Disease

## 2015-01-22 VITALS — BP 130/80 | HR 74 | Ht 65.0 in | Wt 245.8 lb

## 2015-01-22 DIAGNOSIS — G4733 Obstructive sleep apnea (adult) (pediatric): Secondary | ICD-10-CM

## 2015-01-22 DIAGNOSIS — R002 Palpitations: Secondary | ICD-10-CM | POA: Diagnosis not present

## 2015-01-22 DIAGNOSIS — Z01810 Encounter for preprocedural cardiovascular examination: Secondary | ICD-10-CM | POA: Diagnosis not present

## 2015-01-22 DIAGNOSIS — I5032 Chronic diastolic (congestive) heart failure: Secondary | ICD-10-CM | POA: Diagnosis not present

## 2015-01-22 DIAGNOSIS — I10 Essential (primary) hypertension: Secondary | ICD-10-CM

## 2015-01-22 DIAGNOSIS — Z9989 Dependence on other enabling machines and devices: Secondary | ICD-10-CM

## 2015-01-22 NOTE — Assessment & Plan Note (Signed)
Unclear for palpitations at nighttime is secondary to her sleep apnea. We will schedule an appointment with board-certified sleep specialist. She has a CPAP from 2008 and is concerned her settings and fitting may not be appropriate. Appointment has been made

## 2015-01-22 NOTE — Assessment & Plan Note (Signed)
She appears relatively euvolemic on today's visit. Taking Lasix when necessary. Weight is stable. No significant symptoms of shortness of breath

## 2015-01-22 NOTE — Progress Notes (Signed)
Patient ID: [name] [name], female    DOB: 1935-11-26, 80 y.o.   MRN: [id number]  HPI Comments: [name] [name] is a 80 yo woman with a history of atrial fibrillation, obesity, symptomatic palpitations/PVCs, status post pacemaker implantation for sick sinus syndrome/tachybradycardia syndrome with chronic symptoms of palpitations, shortness of breath with exertion, who presents for routine followup of her atrial fibrillation and shortness of breath History of chronic fatigue  In follow-up today, she reports that she is doing well. She continues to wake up in the middle of the night with short runs of palpitations, typically that resolve after several deep breaths. She wonders if it could be from her obstructive sleep apnea.  She has an old  CPAP machine from 2008. She is inquiring about an upgrade. Unclear for settings have changed. Sleep study last in 2014  No regular exercise. She is scheduled for colonoscopy with Dr. Allen Norris. She is nervous about coming off her Coumadin. Nervous about heart failure  EKG on today's visit shows normal sinus rhythm with rate 74 bpm, diffuse T-wave abnormality  Other past medical history Recent Pacer changes 10/15, felt better Takes bystolic 20 BID Wears compressions on a regular basis  Previously on HCTZ, had abd cramps, ABD pains (now stopped) Finger fungus, on lamacil daily  June 2013 total cholesterol 155, LDL 82, HDL 56  Other past medical history Previous hospital admission on 09/24/2013 for acute on chronic diastolic CHF. She was drinking a significant amount of fluids. She was treated with IV diuretics with improvement of her symptoms. Was recommended that she go home with Lasix every other day. She had a stress test while in the hospital showing no ischemia. Echocardiogram dated 09/24/2013 showing ejection fraction 50-55%,, mild to moderate tricuspid regurg, mild to moderate MR, normal RV function, normal right ventricular systolic pressure   ER again on  10/18/2013.   Previous hx of shortness of breath and palpitations.  May 2012, she had adjustments to her pacemaker and since that time has felt back to normal.   she has had long hx of hot flashes, and a racing heart rate when she is in bed. Recently this has been well-controlled on high-dose bystolic.   chronic back and hip pain. She did receive a cortisone shot on the left with no significant improvement. She reports that her sugars have been well-controlled, blood pressure well controlled on current medications   syncope episode August 2013 .  in her bedroom folding clothes and the next thing she knew she found herself on the floor in the bathroom. She spent 2 days in the hospital, then at least 10 days in rehabilitation for significant left arm bruising and she was on warfarin. Pacemaker was interrogated after the fact showing 85% atrial paced rhythm, no other arrhythmia. Etiology concerning for vasovagal episode, though exact cause is uncertain.  She has chronic buzzing in her ear. She does report this is a chronic issue going back many years. Prior workup with ear nose throat. She does have prior exposure to loud noises, worked in a [employer].   cardiac catheterization in 2004, stress testing,   echo  showed normal systolic function, mild LVH, diastolic relaxation abnormality, mildly elevated right ventricular systolic pressures, mild MR.  treadmill study showing normal blood pressure response to exercise, chronotropic incompetence with peak heart rate of 100 beats per minute, resting heart rate in the 70s. Previous  PFTs  demonstrated extrinsic compression/restrictive physiology.  No Known Allergies  Outpatient Encounter Prescriptions as of  01/22/2015  Medication Sig  . ACCU-CHEK AVIVA PLUS test strip 1 each as needed.   . ALPRAZolam (XANAX) 0.5 MG tablet Take 0.5 mg by mouth daily as needed.   Marland Kitchen amLODipine (NORVASC) 5 MG tablet Take 5 mg by mouth daily.  Marland Kitchen atorvastatin (LIPITOR) 20 MG  tablet Take 20 mg by mouth daily.  . Blood Glucose Monitoring Suppl (ACCU-CHEK AVIVA PLUS) W/DEVICE KIT daily.   Marland Kitchen esomeprazole (NEXIUM) 40 MG capsule Take 40 mg by mouth daily.   . furosemide (LASIX) 20 MG tablet Take 20 mg by mouth daily as needed for fluid.  Marland Kitchen glipiZIDE (GLUCOTROL) 10 MG tablet Take 10 mg by mouth 2 (two) times daily before a meal.  . metFORMIN (GLUCOPHAGE-XR) 500 MG 24 hr tablet 500 mg 2 (two) times daily.   Marland Kitchen MICROLET LANCETS MISC as needed.   . mometasone (ELOCON) 0.1 % cream Apply topically daily. Apply to ear as directed two times a day for 10 days as needed     . Multiple Vitamins-Minerals (CENTRUM SILVER PO) Take by mouth daily.  . Nebivolol HCl (BYSTOLIC) 20 MG TABS Take 2 tablets (40 mg total) by mouth daily.  Marland Kitchen olmesartan (BENICAR) 40 MG tablet Take 40 mg by mouth daily.  . Omega-3 Fatty Acids (FISH OIL) 1200 MG CAPS Take 1,200 mg by mouth daily.  Marland Kitchen SIMBRINZA 1-0.2 % SUSP Place 1 drop into both eyes 2 (two) times daily.   . travoprost, benzalkonium, (TRAVATAN) 0.004 % ophthalmic solution Place 1 drop into both eyes at bedtime.   Marland Kitchen warfarin (COUMADIN) 3 MG tablet Take 3-4.5 mg by mouth daily at 6 PM. Takes 76m on Tues, Thurs, Sat and Sun. Takes 4.525mon Mon, Wed and Friday.  . [DISCONTINUED] atorvastatin (LIPITOR) 20 MG tablet TAKE ONE TABLET BY MOUTH DAILY. (Patient not taking: Reported on 01/22/2015)  . [DISCONTINUED] terbinafine (LAMISIL) 250 MG tablet Take 250 mg by mouth daily.    Past Medical History  Diagnosis Date  . SSS (sick sinus syndrome)     a. 2008 s/p MDT PPM;  b. 11/2013 Gen change- MDT ADDRL1 Adapta DC PPM, ser # [id number].  . Marland KitchenAF (paroxysmal atrial fibrillation)   . Unspecified essential hypertension   . Esophageal reflux   . Unspecified glaucoma   . Morbid obesity   . History of UTI   . Syncope and collapse     a. Felt to be vasovagal.  . Hyperlipidemia   . OSA on CPAP   . Type II diabetes mellitus   . Anemia   . H/O hiatal hernia    . Arthritis     a. shoulders.  . Anxiety   . Chronic diastolic heart failure     a. 09/2013 Echo: EF 50-55%, mild LVH, mild to mod MR/TR.  . 1St degree AV block     a. PR 400 msec with Apacing  . Symptomatic PVCs   . Chest pain     a. 2004 Cath:  reportedly nl;  b. 09/2013 Neg MV.    Past Surgical History  Procedure Laterality Date  . Vaginal hysterectomy  1970  . Incision and drainage of wound Right 03/1998    "leg; it was like a boil" (12/01/2013)  . Insert / replace / remove pacemaker  2008; 12/01/2013    MDT ADDRL1 pacemaker - gen change by Dr KlCaryl Comes2/29/2014  . Dilation and curettage of uterus    . Permanent pacemaker generator change N/A 12/01/2013    Procedure: PERMANENT PACEMAKER GENERATOR  CHANGE;  Surgeon: Deboraha Sprang, MD;  Location: [facility] CATH LAB;  Service: Cardiovascular;  Laterality: N/A;    Social History  reports that she quit smoking about 25 years ago. Her smoking use included Cigarettes. She has a 17 pack-year smoking history. She has never used smokeless tobacco. She reports that she does not drink alcohol or use illicit drugs.  Family History Family history is unknown by patient.  Review of Systems  Constitutional: Negative.   Eyes: Negative.   Respiratory: Negative.   Cardiovascular: Positive for palpitations.  Gastrointestinal: Negative.   Musculoskeletal: Positive for myalgias, back pain, arthralgias and gait problem.  Skin: Negative.   Neurological: Negative.   Hematological: Negative.   Psychiatric/Behavioral: Negative.   All other systems reviewed and are negative.   BP 130/80 mmHg  Pulse 74  Ht '5\' 5"'  (1.651 m)  Wt 245 lb 12 oz (111.471 kg)  BMI 40.89 kg/m2  Physical Exam  Constitutional: She is oriented to person, place, and time. She appears well-developed and well-nourished.  HENT:  Head: Normocephalic.  Nose: Nose normal.  Mouth/Throat: Oropharynx is clear and moist.  Eyes: Conjunctivae are normal. Pupils are equal, round, and  reactive to light.  Neck: Normal range of motion. Neck supple. No JVD present.  Cardiovascular: Normal rate, regular rhythm, S1 normal, S2 normal, normal heart sounds and intact distal pulses.  Exam reveals no gallop and no friction rub.   No murmur heard. Pulmonary/Chest: Effort normal and breath sounds normal. No respiratory distress. She has no wheezes. She has no rales. She exhibits no tenderness.  Abdominal: Soft. Bowel sounds are normal. She exhibits no distension. There is no tenderness.  Musculoskeletal: Normal range of motion. She exhibits no edema or tenderness.  Lymphadenopathy:    She has no cervical adenopathy.  Neurological: She is alert and oriented to person, place, and time. Coordination normal.  Skin: Skin is warm and dry. No rash noted. No erythema.  Psychiatric: She has a normal mood and affect. Her behavior is normal. Judgment and thought content normal.    Assessment and Plan  Nursing note and vitals reviewed.

## 2015-01-22 NOTE — Patient Instructions (Signed)
You are doing well. No medication changes were made.  Please call us if you have new issues that need to be addressed before your next appt.  Your physician wants you to follow-up in: 6 months.  You will receive a reminder letter in the mail two months in advance. If you don't receive a letter, please call our office to schedule the follow-up appointment.   

## 2015-01-22 NOTE — Assessment & Plan Note (Signed)
Blood pressure is well controlled on today's visit. No changes made to the medications. 

## 2015-01-22 NOTE — Assessment & Plan Note (Signed)
Appointment made with pulmonary for further evaluation. Recent sleep study from 2014 will be scanned in

## 2015-01-22 NOTE — Assessment & Plan Note (Signed)
We have encouraged continued exercise, careful diet management in an effort to lose weight. 

## 2015-01-26 DIAGNOSIS — I10 Essential (primary) hypertension: Secondary | ICD-10-CM | POA: Diagnosis not present

## 2015-01-26 DIAGNOSIS — R002 Palpitations: Secondary | ICD-10-CM | POA: Diagnosis not present

## 2015-01-26 DIAGNOSIS — Z7901 Long term (current) use of anticoagulants: Secondary | ICD-10-CM | POA: Diagnosis not present

## 2015-01-27 ENCOUNTER — Telehealth: Payer: Self-pay

## 2015-01-27 NOTE — Telephone Encounter (Signed)
She had ov on 01/22/15.  Is she clear to proceed?

## 2015-01-27 NOTE — Telephone Encounter (Signed)
[facility] requesting cardiac clearance for colonoscopy.

## 2015-01-28 NOTE — Telephone Encounter (Signed)
Faxed Dr. Donivan Scull note, ov note, and Ryan's clearance to Dr. Rolin Barry office at [phone].

## 2015-01-28 NOTE — Telephone Encounter (Signed)
Okay to proceed with colonoscopy.

## 2015-01-29 DIAGNOSIS — Z7901 Long term (current) use of anticoagulants: Secondary | ICD-10-CM | POA: Diagnosis not present

## 2015-02-02 ENCOUNTER — Telehealth: Payer: Self-pay

## 2015-02-02 NOTE — Telephone Encounter (Signed)
Nurse from [facility] states that they did receive office notes, but has a question regarding Lovenox bridge and Coumadin.

## 2015-02-02 NOTE — Telephone Encounter (Signed)
Left message for Herb Grays to call back.

## 2015-02-04 NOTE — Telephone Encounter (Signed)
Spoke w/ Dr. Rockey Situ who reviewed pt's chart and recommends that pt not bridge w/ Lovenox, but hold coumadin x 4 days prior to colonoscopy. Spoke w/ [employer] @ [facility] and advised her of this.  She verbalizes understanding, is agreeable and will call back w/ any further questions or concerns.

## 2015-02-10 ENCOUNTER — Encounter: Payer: Self-pay | Admitting: *Deleted

## 2015-02-18 ENCOUNTER — Encounter: Payer: Self-pay | Admitting: Internal Medicine

## 2015-03-02 ENCOUNTER — Ambulatory Visit: Payer: Self-pay | Admitting: Gastroenterology

## 2015-03-02 DIAGNOSIS — K640 First degree hemorrhoids: Secondary | ICD-10-CM | POA: Diagnosis not present

## 2015-03-02 DIAGNOSIS — D123 Benign neoplasm of transverse colon: Secondary | ICD-10-CM | POA: Diagnosis not present

## 2015-03-02 DIAGNOSIS — F329 Major depressive disorder, single episode, unspecified: Secondary | ICD-10-CM | POA: Diagnosis not present

## 2015-03-02 DIAGNOSIS — K5090 Crohn's disease, unspecified, without complications: Secondary | ICD-10-CM | POA: Diagnosis not present

## 2015-03-02 DIAGNOSIS — E118 Type 2 diabetes mellitus with unspecified complications: Secondary | ICD-10-CM | POA: Diagnosis not present

## 2015-03-02 DIAGNOSIS — G4730 Sleep apnea, unspecified: Secondary | ICD-10-CM | POA: Diagnosis not present

## 2015-03-02 DIAGNOSIS — K921 Melena: Secondary | ICD-10-CM | POA: Diagnosis not present

## 2015-03-02 DIAGNOSIS — N289 Disorder of kidney and ureter, unspecified: Secondary | ICD-10-CM | POA: Diagnosis not present

## 2015-03-02 DIAGNOSIS — F419 Anxiety disorder, unspecified: Secondary | ICD-10-CM | POA: Diagnosis not present

## 2015-03-02 DIAGNOSIS — K5730 Diverticulosis of large intestine without perforation or abscess without bleeding: Secondary | ICD-10-CM | POA: Diagnosis not present

## 2015-03-02 DIAGNOSIS — E669 Obesity, unspecified: Secondary | ICD-10-CM | POA: Diagnosis not present

## 2015-03-02 DIAGNOSIS — R002 Palpitations: Secondary | ICD-10-CM | POA: Diagnosis not present

## 2015-03-02 DIAGNOSIS — K219 Gastro-esophageal reflux disease without esophagitis: Secondary | ICD-10-CM | POA: Diagnosis not present

## 2015-03-02 DIAGNOSIS — I509 Heart failure, unspecified: Secondary | ICD-10-CM | POA: Diagnosis not present

## 2015-03-02 DIAGNOSIS — Z1211 Encounter for screening for malignant neoplasm of colon: Secondary | ICD-10-CM | POA: Diagnosis not present

## 2015-03-02 DIAGNOSIS — I10 Essential (primary) hypertension: Secondary | ICD-10-CM | POA: Diagnosis not present

## 2015-03-08 DIAGNOSIS — E785 Hyperlipidemia, unspecified: Secondary | ICD-10-CM | POA: Diagnosis not present

## 2015-03-08 DIAGNOSIS — E1129 Type 2 diabetes mellitus with other diabetic kidney complication: Secondary | ICD-10-CM | POA: Diagnosis not present

## 2015-03-08 DIAGNOSIS — I4891 Unspecified atrial fibrillation: Secondary | ICD-10-CM | POA: Diagnosis not present

## 2015-03-08 DIAGNOSIS — Z7901 Long term (current) use of anticoagulants: Secondary | ICD-10-CM | POA: Diagnosis not present

## 2015-03-08 DIAGNOSIS — I10 Essential (primary) hypertension: Secondary | ICD-10-CM | POA: Diagnosis not present

## 2015-03-15 DIAGNOSIS — Z5181 Encounter for therapeutic drug level monitoring: Secondary | ICD-10-CM | POA: Diagnosis not present

## 2015-03-16 ENCOUNTER — Institutional Professional Consult (permissible substitution): Payer: Medicare Other | Admitting: Pulmonary Disease

## 2015-03-17 ENCOUNTER — Telehealth: Payer: Self-pay | Admitting: *Deleted

## 2015-03-17 NOTE — Telephone Encounter (Signed)
Spoke w/ pt.  She reports that she has been weak & tired for the past couple of days "off & on, not continuous". Pt does not have BP or HR readings to report, as she states that her monitor is not the most accurate.  Pt weighs daily and her wt has been stable b/t 138-140.  139.8 this am. Reports that she takes lasix every other day, as she has a little swelling in her left ankle. Pt has not changed her diet, continues to follow low Na+ diet.  Reports that she has been feeling "down" and like she is going to pass out.  Pt states that her HR was low when she previously felt like this.  Asked pt to check her BP & HR while I hold, but she states "I'm sitting on the toilet right now". Asked her to call back later w/ some readings.  She is agreeable and will call back before the end of the day.

## 2015-03-17 NOTE — Telephone Encounter (Signed)
Left message on pt's vm w/ Ryan's recommendation.  Asked her to call back w/ any questions or concerns.

## 2015-03-17 NOTE — Telephone Encounter (Signed)
Pt c/o Shortness Of Breath: STAT if SOB developed within the last 24 hours or pt is noticeably SOB on the phone  1. Are you currently SOB (can you hear that pt is SOB on the phone)? Yes, fatiqued and feels like she is going to pass out  2. How long have you been experiencing SOB? Past 2 days with coughing at night and has a cpap  3. Are you SOB when sitting or when up moving around? both  4. Are you currently experiencing any other symptoms? See above

## 2015-03-17 NOTE — Telephone Encounter (Signed)
Pt calling back with BP reading: 149/51, hr 81

## 2015-03-17 NOTE — Telephone Encounter (Signed)
1. Does patient feel like she is coming down with anything? 2. Has she gotten her CPAP from 2008 updated? 3. Her weight, diet, fluid consumption, BP, and pulse all look to be fairly stable. 4. Sounds like she may need to be seen by PCP initially to get their input. 5. If she continues to feel this way and checks her HR and sees that it is low she could hold her BB for a short duration as well.

## 2015-03-23 DIAGNOSIS — Z5181 Encounter for therapeutic drug level monitoring: Secondary | ICD-10-CM | POA: Diagnosis not present

## 2015-03-23 NOTE — Consult Note (Signed)
PATIENT NAME:  [name] [name], [name] [name] MR#:  [id number] DATE OF BIRTH:  [birth year]  DATE OF CONSULTATION:  08/01/2012  CONSULTING PHYSICIAN:  Timoteo Gaul, MD  REASON FOR CONSULTATION: Left shoulder pain, right knee pain, and right small finger injury.   HISTORY: [name] [name] is a 80 year old female who was admitted to the Medical Service after presenting to the [facility] status post a fall at home. The patient states today that she injured her left shoulder during the fall and landed on her knee and has pain over the anterior right knee as well as sustained an injury to the right finger which is now splinted from the Emergency Department. The majority of her discomfort is in the left shoulder.   PAST MEDICAL HISTORY: The patient's past medical history is reviewed from her admission History and Physical.   PHYSICAL EXAMINATION:  LEFT SHOULDER: The patient's skin is intact. She has medial ecchymosis from the mid humerus extending to the elbow. The patient was able to flex and extend her elbow without significant pain. She had no ligamentous instability. The patient can abduct and forward elevate approximately 60 degrees and could abduct and forward elevate against mild resistance with discomfort but no significant weakness. She had intact sensation in all five digits and a palpable radial pulse. The patient had external rotation to approximately 45 degrees and could internally rotate to her body. She had intact motor function throughout all muscle groups of the left upper extremity including the deltoid and rotator cuff.   RIGHT KNEE: Examination of the right knee shows a small superficial abrasion with some ecchymosis and mild swelling. She is able to perform a straight leg raise. She can actively flex the knee to approximately 60 degrees without significant pain. She had tenderness over the quadriceps and patellar tendon.   The patient had intact motor and sensory function in  the right lower extremity and palpable pedal pulses.   RIGHT HAND: The patient is splinted. Her skin is intact. The splint is keeping the DIP and PIP joints in full extension.  RADIOLOGICAL DATA: X-ray films of the left shoulder, right knee, and right hand were reviewed from the Emergency Department. These films demonstrate no evidence of fracture, dislocation or other osseous abnormalities in any of the three joints.   ASSESSMENT:  1. Left shoulder contusion versus sprain.  2. Right knee contusion.  3. Right small finger soft tissue mallet.   PLAN: [name] [name] was able to bear weight on her right lower extremity today with physical therapy. Her ability to ambulate with a walker today was more limited from pain in the left shoulder. She may require use of a platform walker in order to participate with physical therapy comfortably. The patient has significant ecchymosis of the left shoulder, but this is due to her use of Coumadin. I did not see any evidence of significant soft tissue damage on exam, and there is no evidence of fracture on her x-rays. The patient may weight-bear on the arm as tolerated. She also may weight-bear as tolerated on the right lower extremity with a walker. I anticipate that she will require a rehab stay.   Finally, in regards to her right small finger, she has a soft tissue mallet and should continue to wear the splint until she follows up with me in two weeks.   ____________________________ Timoteo Gaul, MD klk:cbb D: 08/02/2012 15:59:10 ET T: 08/02/2012 16:10:55 ET JOB#: [id number]  cc: Timoteo Gaul, MD, <Dictator> [facility]  Sallyanne Kuster MD ELECTRONICALLY SIGNED 08/14/2012 17:21

## 2015-03-23 NOTE — Consult Note (Signed)
Brief Consult Note: Diagnosis: Contusion of left elbow, right knee and small finger mallet finger.   Patient was seen by consultant.   Consult note dictated.   Recommend further assessment or treatment.   Comments: I saw and examined the patient last night.   Exam and radiographs show no evidence of fracture.  She may continue working with physical therapy.  Patient may need a platform walker if her left shoulder is limiting her ability to use a walker.  Continue continuous use of right small finger splint.  May be discharged from orthopaedic standpoint if cleared by PT.  Follow up in the office in 2 weeks.  Electronic Signatures: Thornton Park (MD)  (Signed [phone] 09:22)  Authored: Brief Consult Note   Last Updated: 30-Aug-13 09:22 by Thornton Park (MD)

## 2015-03-23 NOTE — H&P (Signed)
PATIENT NAME:  [name] [name], [name] [name] MR#:  [id number] DATE OF BIRTH:  06-07-1935  DATE OF ADMISSION:  08/01/2012  PRIMARY CARE PHYSICIAN: Dr. Jarome Lamas   CHIEF COMPLAINT: Presyncope/fall.   HISTORY OF PRESENT ILLNESS: This is a 80 year old female who presents to the Emergency Room due to a presyncopal episode and a fall earlier today. Patient said that she was getting some clothes out of the washer and then went up to her bedroom and was attempting to go to the bathroom and then turned around and stumbled and fell into the bathroom. She did not complain of losing any consciousness. She does remember stumbling but doesn't  remember how she ended up in the bathroom. She denied any dizziness, any blurry vision, any headache, any nausea, vomiting, chest pain, shortness of breath, any associated symptoms prior to having the fall today. Patient denies ever having a fall like this before. When she came to the ER she was noted to have significant bruising on her left shoulder, also on her right knee. Hospitalist services were contacted for further treatment and evaluation.   REVIEW OF SYSTEMS: CONSTITUTIONAL: No documented fever. No weight gain. No weight loss. EYES: No blurred or double vision. ENT: No tinnitus. No postnasal drip. No redness of the oropharynx. RESPIRATORY: No cough, no wheeze, no hemoptysis, no dyspnea. CARDIOVASCULAR: No chest pain, no orthopnea, no palpitations. Positive presyncope. GASTROINTESTINAL: No nausea, no vomiting, no diarrhea, no abdominal pain, no melena, no hematochezia. GENITOURINARY: No dysuria, no hematuria. ENDOCRINE: No polyuria or nocturia. No heat or cold intolerance. HEME: No anemia, no bruising, no bleeding. INTEGUMENTARY: No rashes. No lesions. MUSCULOSKELETAL: No arthritis, no swelling, no gout. NEUROLOGIC: No numbness, no tingling, no ataxia, no seizure-type activity. PSYCH: No anxiety, no insomnia, no ADD.   PAST MEDICAL HISTORY:  1. Diabetes.  2. Hypertension.   3. Hyperlipidemia.  4. History of chronic atrial fibrillation. 5. Gastroesophageal reflux disease.  6. Glaucoma.   ALLERGIES: No known drug allergies.   SOCIAL HISTORY: No smoking. No alcohol abuse. No illicit drug abuse. Lives at home by herself.   FAMILY HISTORY: Patient's mother died from pancreatic cancer. Father died from complications of heart disease.   CURRENT MEDICATIONS:  1. Xanax 0.5 mg b.i.d. as needed.  2. Atorvastatin 20 mg daily.  3. Vitamin B complex daily.  4. Diltiazem 360 mg daily.  5. Nexium 40 mg b.i.d.  6. Mometasone cream to be applied as needed.  7. Nebivolol 20 mg daily. 8. CPAP at bedtime.  9. Benicar 40 mg daily.  10. Omega-3 fatty acids 1000 mg 1 tab daily.  11. Travatan eyedrops at bedtime.  12. Warfarin 3 mg on Tuesday, Thursday, Saturday, Sunday; 3.5 mg on Monday, Wednesday, Friday. 13. Glipizide extended release 10 mg with breakfast.  14. Azopt 1% one drop to both eyes b.i.d.   PHYSICAL EXAMINATION ON ADMISSION:  VITAL SIGNS: Patient's vital signs are noted to be: Temperature 98.6, pulse 59, respirations 14, blood pressure 143/64, saturations 97% on room air.   GENERAL: She is a pleasant appearing female, no apparent distress.   HEENT: Atraumatic, normocephalic. Extraocular muscles are intact. Pupils equal, reactive to light. Sclerae anicteric. No conjunctival injection. No pharyngeal erythema.   NECK: Supple. There is no jugular venous distention, no bruits, no lymphadenopathy, no thyromegaly.   HEART: Regular rate and rhythm. No murmurs, no rubs, no clicks.   LUNGS: Clear to auscultation bilaterally. No rales, no rhonchi, no wheezes.   ABDOMEN: Soft, flat, nontender, nondistended. Has good bowel  sounds. No hepatosplenomegaly appreciated.   EXTREMITIES: No evidence of any cyanosis, clubbing, or peripheral edema. Has +2 pedal and radial pulses bilaterally.   NEUROLOGIC: Patient is alert, awake, oriented x3 with no focal motor or sensory  deficits appreciated bilaterally.   SKIN: Moist, warm. She does have from a small right knee bruise, also a significant hematoma with a bruise on the left shoulder under the axilla. She also has mild bruising on her right hand including her fifth digit.   LYMPHATIC: There is no cervical or axillary lymphadenopathy.   LABORATORY, DIAGNOSTIC, AND RADIOLOGICAL DATA: Serum glucose 248, BUN 13, creatinine 1.3, sodium 142, potassium 3.8, chloride 105, bicarbonate 28. LFTs are within normal limits. Troponin less than 0.02. White cell count 10, hemoglobin 11.2, hematocrit 34.1, platelet count 214. INR 1.8.   Patient did have a CT of the head done without contrast showing no acute intracranial process.   ASSESSMENT AND PLAN: This is a 80 year old female history of diabetes, hypertension, chronic atrial fibrillation, hyperlipidemia, glaucoma, gastroesophageal reflux disease presents to the hospital with a presyncopal episode and a fall.  1. Presyncope/fall. Patient apparently does remember stumbling into the bathroom but does not recall how she got there although she did not have any true loss of consciousness. There was no incontinence. There was no seizure-type activity. I presume this is likely a mechanical fall. CT head is negative. She has a nonfocal neurological exam. Observe her overnight on telemetry, watch for any arrhythmias. Check serial cardiac markers. I will go ahead and get a carotid duplex. Will also get a physical therapy evaluation in the morning as she has significant bruising on her right knee and her left shoulder and has poor mobility now. She may benefit from possible home health services as she lives by herself.  2. Diabetes. No evidence of any hypoglycemia. Continue with her glipizide and a carb-controlled diet. 3. Hypertension. Continue Cardizem and nebivolol. When she is able to bear weight on her right extremity would like to check her orthostatics to rule out if that is the cause of  her presyncope.  4. Atrial fibrillation. She is currently in sinus rhythm and rate controlled. Continue Cardizem. INR is subtherapeutic but will continue with Coumadin at the dose stated.   5. Glaucoma. Continue Travatan and Azopt eyedrops.  6. Hyperlipidemia. Continue atorvastatin.  7. Gastroesophageal reflux disease. Continue with Nexium.  8. CODE STATUS: Patient is a FULL CODE.   TIME SPENT: 45 minutes.  ____________________________ Belia Heman. Verdell Carmine, MD vjs:cms D: 08/01/2012 01:30:44 ET T: 08/01/2012 07:59:51 ET JOB#: [id number]  cc: Belia Heman. Verdell Carmine, MD, <Dictator> Irven Easterly. Kary Kos, MD Henreitta Leber MD ELECTRONICALLY SIGNED 08/05/2012 20:21

## 2015-03-23 NOTE — Discharge Summary (Signed)
PATIENT NAME:  [name] [name], [name] [name] MR#:  [id number] DATE OF BIRTH:  06/27/1935  DATE OF ADMISSION:  08/01/2012 DATE OF DISCHARGE:  08/02/2012  DISCHARGE DIAGNOSES:  1. Vasovagal syncope.  2. Fall causing left shoulder and right knee pain.  3. Hypertension.  4. Diabetes mellitus, type 2.  5. Hyperlipidemia.  6. Chronic atrial fibrillation.  7. Sleep apnea.   DISCHARGE MEDICATIONS:  1. Nexium 40 mg p.o. b.i.d.  2. Xanax 0.5 mg t.i.d. p.r.n. 3. Lipitor 20 mg daily.  4. Fish oil 1200 mg once a day. 5. Cardizem CD 360 mg, 1 capsule daily.  6. Nebivolol 20 mg daily.  7. Benicar 40 mg daily.  8. Travatan eye drops into affected eye once a day.  9. Coumadin 3 mg on Tuesday, Thursday, Saturday and Sunday and 1/2 tablet on Monday, Wednesday, Friday.  10. Azopt eye drops in the affected eye t.i.d.  11. Glipizide 10 mg p.o. b.i.d.  12. Cipro 500 mg p.o. b.i.d. for 5 days.   DIET: Low sodium, ADA, low fat diet.   DISCHARGE INSTRUCTIONS:  1. The patient is going to [facility].  2. The patient has CPAP at night that she can continue.  3. For chronic atrial fibrillation, she is on Coumadin.    CONSULTATIONS:  1. Orthopedic consult.  2. Physical Therapy consult.   LABORATORY, DIAGNOSTIC AND RADIOLOGICAL DATA: CT of the head showed no acute intracranial abnormality. Shoulder x-ray on the left side showed the patient has supraspinatus calcified density, probable tendinitis, and also ho fracture. Right knee x-ray shows no acute fracture, some vascular calcifications present. Urinalysis showed hazy urine with leukocyte esterase negative, WBC 1, bacteria trace. CBC: WBC 10, hemoglobin 11.2, hematocrit 34.1, platelets 213.0. Electrolytes: Sodium 142, potassium 3.8, chloride 105, bicarbonate 28, BUN 13, creatinine 1.37, glucose 248. Hand x-ray on the right side showed distal interphalangeal joints of the right fifth finger are held in flexion. EKG: Pacemaker with 61 beats per minute. Troponins  have been negative. Carotid ultrasound showed no evidence of hemodynamically significant stenosis. Troponin is negative. INR is pending today but INR yesterday was 1.8.   HOSPITAL COURSE: The patient is a 80 year old female admitted for syncope. Look at the History and Physical for full details. The patient had no prior symptoms before the fall and was admitted for syncope and observed overnight. CT head was negative. Carotid ultrasound was negative. Cardiac markers were negative. No seizure activity. The patient might have vasovagal syncope. She has significant bruising in the right knee and also left shoulder, seen by Dr. Mack Guise, who recommended conservative treatment. The patient has getting some cold packs and warm packs to the shoulder and the right knee. The patient's x-rays have no fractures. She has continued physical therapy. Physical Therapy recommended short-term rehab. She has a bed at [facility]. The patient can use a platform walker if her left shoulder is limiting her ability to use a walker. The patient was advised to continue the finger splint for the right side small finger because she has a small finger mallet finger. The patient can follow up with Dr. Mack Guise in two weeks. Continue physical therapy.   The patient has diabetes mellitus. Her sugars are a little bit high at 181. She had recently seen Dr. Gabriel Carina, and she is on glipizide 10 b.i.d.  She has an appointment to see her again, and she can have further adjustment of medications when she sees her. Right now, I am not adjusting any.   The  patient has hypertension, chronic atrial fibrillation.  I restarted her Coumadin. The patient's heart rate is stable and blood pressure is also stable. The patient can continue Coumadin and also Cardizem and nebivolol. Recheck the INR at [facility] and adjust the Coumadin accordingly.   For hyperlipidemia, she is on statins, continue that. She has a history of sleep apnea.  She can continue CPAP. The patient has a mild amount of bacteria in the urine, but leukocyte esterase is negative, WBC is only 1, so she can have a repeat follow-up for urinalysis if she develops symptoms.   TIME SPENT ON DISCHARGE PREPARATION: More than 30 minutes.  ____________________________ Epifanio Lesches, MD sk:cbb D: 08/02/2012 10:55:08 ET T: 08/02/2012 14:02:54 ET JOB#: [id number]  cc: Epifanio Lesches, MD, <Dictator> Epifanio Lesches MD ELECTRONICALLY SIGNED 08/15/2012 18:19

## 2015-03-25 DIAGNOSIS — I10 Essential (primary) hypertension: Secondary | ICD-10-CM | POA: Diagnosis not present

## 2015-03-25 DIAGNOSIS — D631 Anemia in chronic kidney disease: Secondary | ICD-10-CM | POA: Diagnosis not present

## 2015-03-25 DIAGNOSIS — R600 Localized edema: Secondary | ICD-10-CM | POA: Diagnosis not present

## 2015-03-25 DIAGNOSIS — N183 Chronic kidney disease, stage 3 (moderate): Secondary | ICD-10-CM | POA: Diagnosis not present

## 2015-03-26 NOTE — Consult Note (Signed)
General Aspect 80 yo woman with a history of atrial fibrillation, symptomatic palpitations/PVCs, status post pacemaker implantation for sick sinus syndrome/tachybradycardia syndrome with chronic symptoms of palpitations, shortness of breath with exertion, who presents for SOB. Cardiology was consulted for acute on chronic diastolic CHF.   She reports having acute onset of SOB starting yesterday AM. She went to her PMD and he "did not like the way my heart sounded". He called for EMTs to take her to the hospital. She reports drinking a significant amount of fluid daily. "I love my water". Reports having minimal leg edema. SOB now with exertion. Uncertain how long it has been coming on, worse yesterday AM.     Previous hx of shortness of breath and palpitations.  May 2012, she had adjustments to her pacemaker and since that time has felt back to normal.   she has had long hx of hot flashes, and a racing heart rate when she is in bed. Recently this has been well-controlled on high-dose bystolic.    chronic back and hip pain. She did receive a cortisone shot on the left with no significant improvement. She uses her CPAP . She reports that her sugars have been well-controlled, blood pressure well controlled on current medications   syncope episode August 2013 .  in her bedroom folding clothes and the next thing she knew she found herself on the floor in the bathroom. She spent 2 days in the hospital, then at least 10 days in rehabilitation for significant left arm bruising and she was on warfarin. Pacemaker was interrogated after the fact showing 85% atrial paced rhythm, no other arrhythmia. Etiology concerning for vasovagal episode, though exact cause is uncertain.  She has chronic buzzing in her ear. She does report this is a chronic issue going back many years. Prior workup with ear nose throat. She does have prior exposure to loud noises, worked in a [employer].   Present Illness .  cardiac  catheterization in 2004, stress testing,    echo  in 2011 showed normal systolic function, mild LVH, diastolic relaxation abnormality, mildly elevated right ventricular systolic pressures, mild MR.  treadmill study showing normal blood pressure response to exercise, chronotropic incompetence with peak heart rate of 100 beats per minute, resting heart rate in the 70s. Previous  PFTs  demonstrated extrinsic compression/restrictive physiology.    Total cholesterol 155, LDL 84, hemoglobin A1c 6.5  SOCIAL HISTORY: Lives at home by herself. Her niece and sisters check on her. No smoking, alcohol or drug use. Uses cane for walking most of the days.   FAMILY HISTORY: Significant for heart disease in father, and mom died from pancreatic cancer.   Physical Exam:  GEN well developed, well nourished, no acute distress, obese   HEENT hearing intact to voice, moist oral mucosa   NECK supple   RESP normal resp effort  clear BS  scant crackle at left base   CARD Irregular rate and rhythm  No murmur   ABD denies tenderness  soft   EXTR negative edema   NEURO motor/sensory function intact   PSYCH alert, A+O to time, place, person, good insight   Review of Systems:  Subjective/Chief Complaint SOB, especially with exertion   General: Fatigue  Weakness   Skin: No Complaints   ENT: No Complaints   Eyes: No Complaints   Neck: No Complaints   Respiratory: No Complaints   Cardiovascular: Dyspnea   Gastrointestinal: No Complaints   Genitourinary: No Complaints   Vascular:  No Complaints   Musculoskeletal: No Complaints   Neurologic: No Complaints   Hematologic: No Complaints   Endocrine: No Complaints   Psychiatric: No Complaints   Review of Systems: All other systems were reviewed and found to be negative   Medications/Allergies Reviewed Medications/Allergies reviewed     anemia:    atrial fib:    Sleep Apnea:    Anxiety:    GERD - Esophageal Reflux:    Glaucoma:     HTN:    Diabetes Mellitus, Type II (NIDD):    Pacemaker:    Hysterectomy - Partial:        Admit Diagnosis:   CONGESTIVE HEART FAILURE: Onset Date: 24-Sep-2013, Status: Active, Description: CONGESTIVE HEART FAILURE  Home Medications: Medication Instructions Status  glipiZIDE 10 mg oral tablet 1 tab(s) orally 2 times a day (before meals) Active  warfarin 3 mg oral tablet 1 tab(s) orally Take one tab on Tuesday, Thursday, Saturday, and Sunday. Take 1.5 tab on Monday, Wednesday, and friday Active  Lipitor tablet 20 mg 1 tab(s) orally once a day (at bedtime) Active  nebivolol 20 mg oral tablet 2 tab(s) orally once a day Active  alprazolam tablet 0.5 mg 0.5-1 tab(s) orally , As Needed Active  Cardizem 240 mg/24 hours oral capsule, extended release 1 cap(s) orally once a day Active  metFORMIN extended release 500 mg oral tablet, extended release 1 tab(s) orally 2 times a day Active  olmesartan 40 mg oral tablet 1 tab(s) orally once a day Active  mometasone topical 0.1% topical cream Apply topically to affected area 2 times a day, As Needed Active  brinzolamide ophthalmic 1% ophthalmic suspension 1 drop(s) to each affected eye 2 times a day Active  travoprost ophthalmic 0.004% ophthalmic solution 1 drop(s) to each affected eye once a day (in the evening) Active  Nasonex 50 mcg/inh nasal spray 1 spray(s) nasal once a day Active  multivitamin 1 tab(s) orally once a day Active  Nexium 40 mg oral delayed release capsule 1 cap(s) orally once a day Active   Lab Results:  Routine Chem:  22-Oct-14 05:56   Glucose, Serum  174  BUN 13  Creatinine (comp) 1.29  Sodium, Serum 140  Potassium, Serum  3.4  Chloride, Serum 105  CO2, Serum 29  Calcium (Total), Serum 9.2  Anion Gap  6  Osmolality (calc) 284  eGFR (African American)  47  eGFR (Non-African American)  40 (eGFR values <6m/min/1.73 m2 may be an indication of chronic kidney disease (CKD). Calculated eGFR is useful in patients  with stable renal function. The eGFR calculation will not be reliable in acutely ill patients when serum creatinine is changing rapidly. It is not useful in  patients on dialysis. The eGFR calculation may not be applicable to patients at the low and high extremes of body sizes, pregnant women, and vegetarians.)  Routine Coag:  21-Oct-14 11:10   INR 2.2 (INR reference interval applies to patients on anticoagulant therapy. A single INR therapeutic range for coumarins is not optimal for all indications; however, the suggested range for most indications is 2.0 - 3.0. Exceptions to the INR Reference Range may include: Prosthetic heart valves, acute myocardial infarction, prevention of myocardial infarction, and combinations of aspirin and anticoagulant. The need for a higher or lower target INR must be assessed individually. Reference: The Pharmacology and Management of the Vitamin K  antagonists: the seventh ACCP Conference on Antithrombotic and Thrombolytic Therapy. CVXBLT.9030Sept:126 (3suppl): [id number] A HCT value >55% may artifactually increase the  PT.  In one study,  the increase was an average of 25%. Reference:  "Effect on Routine and Special Coagulation Testing Values of Citrate Anticoagulant Adjustment in Patients with High HCT Values." American Journal of Clinical Pathology 2006;126:400-405.)  Routine Hem:  22-Oct-14 05:56   WBC (CBC) 9.4  RBC (CBC) 4.87  Hemoglobin (CBC)  11.7  Hematocrit (CBC)  34.0  Platelet Count (CBC) 229  MCV  70  MCH  24.1  MCHC 34.5  RDW  16.5  Neutrophil % 66.1  Lymphocyte % 23.6  Monocyte % 7.6  Eosinophil % 2.3  Basophil % 0.4  Neutrophil # 6.2  Lymphocyte # 2.2  Monocyte # 0.7  Eosinophil # 0.2  Basophil # 0.0 (Result(s) reported on 24 Sep 2013 at 06:43AM.)   EKG:  Interpretation EKG shows atrial paced rhythm rate 69 beats per minute, no significant ST or T wave changes   Radiology Results: XRay:    21-Oct-14 12:00, Chest PA and  Lateral  Chest PA and Lateral   REASON FOR EXAM:    Chest Pain  COMMENTS:       PROCEDURE: DXR - DXR CHEST PA (OR AP) AND LATERAL  - Sep 23 2013 12:00PM     RESULT: Pacemaker device is present in the right chest. There is minimal   right lung base atelectasis. The heart is at the upper limits of normal   in size. The lungs are otherwise clear. There is no edema, infiltrate,   effusion, mass or pneumothorax.    IMPRESSION:   1. Minimal right lung base atelectasis versus fibrosis.    Dictation Site: 2    Verified By: Sundra Aland, M.D., MD  [place]:    21-Oct-14 17:59, [place] Color Flow Doppler Low Extrem Bilat (Legs)  [place] Color Flow Doppler Low Extrem Bilat (Legs)   REASON FOR EXAM:    leg pain, left and family history of blood clots  COMMENTS:       PROCEDURE: [place]  - US DOPPLER LOW EXTR BILATERAL  - Sep 23 2013  5:59PM     RESULT: Comparison: None    Findings: Multiple longitudinal and transverse gray-scale as well as   color and spectral Doppler images of  bilateral lower extremity veins   were obtained from the common femoral veins through the popliteal veins.    The right common femoral, greater saphenous, femoral, popliteal veins,   and venous trifurcation are patent, demonstrating normal color-flow and   compressibility. No intraluminal thrombus is identified.There is normal   respiratory variation and augmentation demonstrated at all vein levels.   The left common femoral, greater saphenous, femoral, popliteal veins, and   venous trifurcation are patent, demonstrating normal color-flow and   compressibility. No intraluminal thrombus is identified.There is normal   respiratory variation and augmentation demonstrated at all vein levels.    IMPRESSION:      1. No evidence of DVT in the right lower extremity.  2. No evidence of DVT in the left lower extremity.    Dictation Site: 1        Verified By: Jennette Banker, M.D., MD    No Known Allergies:   Vital  Signs/Nurse's Notes: **Vital Signs.:   22-Oct-14 04:18  Vital Signs Type Routine  Temperature Temperature (F) 98  Temperature Source AdultAxillary  Pulse Pulse 65  Respirations Respirations 20  Systolic BP Systolic BP 916  Diastolic BP (mmHg) Diastolic BP (mmHg) 76  Mean BP 89  Pulse Ox % Pulse Ox %  97  Pulse Ox Activity Level  At rest  Oxygen Delivery Non-invasive ventilation (CPAP/BIPAP)    Impression 80 yo woman with a history of atrial fibrillation, symptomatic palpitations/PVCs, status post pacemaker implantation for sick sinus syndrome/tachybradycardia syndrome with chronic symptoms of palpitations, shortness of breath with exertion, who presents for SOB. Cardiology was consulted for acute on chronic diastolic CHF.   1) Acute on chronic diastolic CHF Drinking signiifcant amount of fluid. She does not watch her weight, or diet.  Not on lasix at home She is in chronic atrial fib, some paced rhythm which will predispose her to diastolic CHF BNP elevated, 2 K --Feels a little better on IV lasix still has not walked. Would recommend continue lasix BID IV --ambulate late today and tomorrow. When SOB improved, would send home on lasix QOD  2) Atrial fib rate controlled on warfarin  3) Sick sinus syndrome pacer in place  4) HTN: Would continue on outpat meds,monitor BP  5) Diabetes: per medical service.   Electronic Signatures: Ida Rogue (MD)  (Signed 22-Oct-14 10:15)  Authored: General Aspect/Present Illness, History and Physical Exam, Review of System, Past Medical History, Health Issues, Home Medications, Labs, EKG , Radiology, Allergies, Vital Signs/Nurse's Notes, Impression/Plan   Last Updated: 22-Oct-14 10:15 by Ida Rogue (MD)

## 2015-03-26 NOTE — H&P (Signed)
PATIENT NAME:  [name] [name], HEINISCH MR#:  [id number] DATE OF BIRTH:  Feb 21, 1935  DATE OF ADMISSION:  09/23/2013  ADMITTING PHYSICIAN: Gladstone Lighter, MD  PRIMARY CARE PHYSICIAN: Trish Fountain, MD  PRIMARY CARDIOLOGIST: Minna Merritts, MD  CHIEF COMPLAINT: Difficulty breathing.  HISTORY OF PRESENT ILLNESS: [name] is a 80 year old African American female with past medical history significant for atrial fibrillation and sick sinus syndrome status post pacemaker on Coumadin, hypertension, diabetes, glaucoma, presented to the hospital secondary to worsening shortness of breath over the last couple of days and also hemoptysis noticed last week. The patient states that she has not noticed any change in her weight, but since yesterday, she felt she has been having extremely worse dyspnea on exertion. At baseline, she lives at home by herself and functional and uses a cane, but could not do anything over the last couple of days due to worsening dyspnea and which was worse this morning. She has obstructive sleep apnea and uses her CPAP as directed every night. Says she could not sleep all night well. When she woke up this morning, her breathing was worse. Also noticed leg edema worsening more than 1 week now. Also she says she had noticed coughing up blood last week. It was bright red blood with some clots. There were 2 episodes about a week ago and then none since then. She went to see her PCP with these symptoms today and he directed her to the Emergency Room for possible admission.   PAST MEDICAL HISTORY: 1.  Chronic atrial fibrillation and sick sinus syndrome, status post pacemaker.  2.  Hypertension.  3.  Non-insulin-dependent diabetes mellitus.  4.  Hyperlipidemia.  5.  Glaucoma.  6.  Gastroesophageal reflux disease.  7.  Obstructive sleep apnea.  8.  Chronic anxiety.  9.  Chronic kidney disease, stage III.   PAST SURGICAL HISTORY: 1.  Pacemaker placement.  2.  Hysterectomy.  3.  Lung  nodule biopsy, which turned out to be benign.   ALLERGIES TO MEDICATIONS: No known drug allergies.   CURRENT HOME MEDICATIONS:  1.  Coumadin 3 mg p.o. every day on Tuesday, Thursday, Saturday and Sunday.  2.  Coumadin 4.5 mg on Monday, Wednesday, Friday.  3.  Xanax 0.25 mg daily as needed for anxiety.  4.  Glipizide 10 mg p.o. b.i.d.  5.  Metformin 500 mg p.o. b.i.d.  6.  Atorvastatin 20 mg p.o. daily.  7.  Olmesartan 40 mg p.o. daily.  8.  Nebivolol 20 mg 2 tablets daily.  9.  Diltiazem 240 mg p.o. daily.  10.  Mometasone 0.1% cream twice a day to the extremities.  11.  Brinzolamide 1% suspension both eyes twice a day.  12.  Nexium 40 mg p.o. daily.  13.  Travatan 0.004% solution, 1 drop both eyes at bedtime.  14.  Nasonex nasal spray daily.  15.  Multivitamin 1 tablet p.o. daily.   SOCIAL HISTORY: Lives at home by herself. Her niece and sisters check on her. No smoking, alcohol or drug use. Uses cane for walking most of the days.   FAMILY HISTORY: Significant for heart disease in father, and mom died from pancreatic cancer.   REVIEW OF SYSTEMS:    CONSTITUTIONAL: No fever, fatigue or weakness.  EYES: No blurred vision, double vision, inflammation. Positive for glaucoma.  EARS, NOSE, THROAT: No tinnitus, ear pain, hearing loss, epistaxis or discharge.  RESPIRATORY: No cough, hemoptysis, or COPD.  CARDIOVASCULAR: Positive for chest pain. Positive for  orthopnea and palpitations. Positive for arrhythmia and dyspnea on exertion.  GASTROINTESTINAL: No nausea, vomiting, diarrhea, abdominal pain, hematemesis or melena.  GENITOURINARY: No dysuria, hematuria, renal calculus, frequency or incontinence.  ENDOCRINE: No polyuria, nocturia, thyroid problems, heat or cold intolerance.  HEMATOLOGIC: No anemia, easy bruising or bleeding.  SKIN: No acne, rash or lesions.  MUSCULOSKELETAL: No neck, back, shoulder pain, arthritis or gout.  NEUROLOGICAL: No numbness, weakness, CVA, TIA or seizures.   PSYCHOLOGICAL: No anxiety, insomnia, depression.   PHYSICAL EXAMINATION: VITAL SIGNS: Temperature 98 degrees Fahrenheit, pulse 83, respirations 18, blood pressure 155/73, pulse oximetry 96% on room air at rest and dropped down to 84% on minimal exertion.  GENERAL: Heavily built, well-nourished female lying in bed, not in any acute distress.  HEENT: Normocephalic, atraumatic. Pupils equal, round, reacting to light. Anicteric sclerae. Extraocular movements intact. Oropharynx is clear without erythema, mass or exudates. Nasopharynx without any lesions or drainage.  NECK: Supple. No thyromegaly, JVD or carotid bruits. No lymphadenopathy. Full range of motion without any pain noted.  LUNGS: Moving air bilaterally. Fine bibasilar crackles. No wheeze or rhonchi. No use of accessory muscles for breathing.  CARDIOVASCULAR: S1, S2, irregular rhythm and normal rate. No murmurs, rubs or gallops.  ABDOMEN: Obese, soft, nontender, nondistended. No hepatosplenomegaly. Normal bowel sounds.  EXTREMITIES: 2+ pedal edema. No clubbing or cyanosis. Feeble dorsalis pedis pulses palpable bilaterally.  SKIN: No acne, rash or lesions.  LYMPHATICS: No cervical or inguinal lymphadenopathy.  NEUROLOGIC: Cranial nerves II through XII remain intact. Strength is 5/5 all 4 extremities. Deep tendon reflexes 2+ symmetric bilaterally and sensation is intact.  PSYCHOLOGICAL: The patient is awake, alert, oriented x 3.   LABORATORY, DIAGNOSTIC AND RADIOLOGICAL DATA: WBC 8.8, hemoglobin 11.4, hematocrit 34.3, platelet count 240. Sodium 138, potassium 4.0, chloride 105, bicarbonate 27, BUN 13, creatinine 1.15, glucose 232 and calcium 9.7. Troponin less than 0.02. INR is 2.2. BNP is elevated at 2012. Chest x-ray revealing right lung with atelectasis versus fibrosis. CT of the chest for PE revealing no evidence of acute PE. There is enlargement of cardiac members with low-grade interstitial edema. Subsegmental atelectasis is present.  Goiter and small amount of soft tissue density in the posterior aspect of trachea at the level of carina. Doppler lower extremities showing no evidence of DVT. EKG showing paced rhythm, atrial fibrillation and PACs, heart rate of 74.   ASSESSMENT AND PLAN: A 80 year old with past medical history of atrial fibrillation, status post pacemaker for sick sinus syndrome, currently on Coumadin, obstructive sleep apnea on CPAP, high blood pressure and diabetes, comes for hemoptysis and worsening dyspnea on exertion. Chest x-ray with mild interstitial edema.  1.  Acute congestive heart failure, possible diastolic dysfunction: No previous echo done, so will order an echo. Admit to telemetry. Lasix IV daily and cardiology consult for further management . Also, the patient has exertional dyspnea, which is not explained by the minimal congestive heart failure changes noted on her chest x-ray, and this could be anginal equivalent, so follow up her echo and check with cardiology. Continue her other cardiac medications.  2.  Atrial fibrillation and sick sinus syndrome, status post pacemaker: Continue Coumadin. INR is therapeutic. Continue nebivolol and Cardizem.  3.  Diabetes mellitus: Check HbA1c. Continue sliding scale insulin, glipizide and metformin.  4.  Glaucoma: Continue her eye drops.  5.  Obstructive sleep apnea: The patient is on CPAP. 6.  Gastrointestinal and deep venous thrombosis prophylaxis: On Nexium and on Coumadin.   CODE STATUS: Full  code.   TIME SPENT ON ADMISSION: 50 minutes.   ____________________________ Gladstone Lighter, MD [id number] D: 09/23/2013 19:16:27 ET T: 09/23/2013 20:02:12 ET JOB#: [id number]  cc: Gladstone Lighter, MD, <Dictator> [facility] Myrla Halsted, MD Minna Merritts, MD Gladstone Lighter MD ELECTRONICALLY SIGNED 09/24/2013 14:24

## 2015-03-26 NOTE — Discharge Summary (Signed)
PATIENT NAME:  [name] [name], [name] [name] MR#:  [id number] DATE OF BIRTH:  22-Nov-1935  DATE OF ADMISSION:  09/23/2013 DATE OF DISCHARGE:  09/26/2013.  ADMITTING PHYSICIAN:  Gladstone Lighter, MD.   DISCHARGING PHYSICIAN:  Gladstone Lighter, M.D.   PRIMARY CARE PHYSICIAN:  Keith Rake, MD.   PRIMARY CARDIOLOGIST:  Ida Rogue, MD.  [place]:  Cardiology consultation by Dr. Rockey Situ and Dr. Fletcher Anon.   DISCHARGE DIAGNOSES:  1.  Acute diastolic CHF exacerbation, EF is greater than 55%.  2.  Hypertension.  3.  Non-insulin-dependent diabetes mellitus.  4.  Obesity.  5.  Obstructive sleep apnea.  6.  Hiatal hernia. 7.  Atrial fibrillation.  8.  Sick sinus syndrome status post pacemaker.  9.  Glaucoma.   DISCHARGE HOME MEDICATIONS:  1.  Glipizide 10 mg p.o. b.i.d.  2.  Nexium 40 mg p.o. daily.  3.  Lipitor 20 mg p.o. at bedtime.  4.  Nebivolol 40 mg p.o. daily.  5.  Xanax 0.25 to 0.5 mg daily.  6.  Cardizem 240 mg p.o. daily.  7.  Metformin 500 mg p.o. b.i.d.  8.  Omlesartan 40 mg p.o. daily.  9.  Mometasone topical cream apply twice a day as needed.  10.  Brinzolamide ophthalmic 1% suspension one drop each eye twice a day.  11.  Travoprost 0.00 ophthalmic solution, one drop each eye once a day in the evening.  12.  Nasonex 50 mcg inhalation spray once a day.  13.  Multivitamin 1 tablet p.o. daily.  14.  Warfarin 325 mg on Sunday, Tuesday, Thursday and Saturday.  15.  Warfarin 4.5 mg on Monday, Wednesday, Friday.  16.  Lasix 20 mg p.o. daily.   DISCHARGE DIET:  Low-sodium, ADA, 1800 calorie diet.   DISCHARGE ACTIVITY:  As tolerated.   FOLLOWUP INSTRUCTIONS:  1.  PCP followup in 3 weeks.  2.  Followup with Dr. Rockey Situ in 2 weeks.  3.  Home Health, Physical Therapy and nursing.   LABS AND IMAGING STUDIES PRIOR TO DISCHARGE:  WBC 9.4, hemoglobin 9.7, hematocrit 34.0, platelet count 229.   Sodium 139, potassium 3.3, chloride 104, bicarb 28, BUN 24, creatinine 1.6, glucose  187 and calcium of 9.1. INR was 2.2. Troponins were negative. Myocardial Lexiscan showing no significant wall motion abnormality. Perfusion study with no significant ischemia. EF is calculated to be 62%. Left ventricular global function was normal. No EKG changes concerning for ischemia noted.   Echo Doppler showing normal LV systolic function, EF [id number] to 55%, mild left ventricular hypertrophy and moderate mitral valve regurgitation is present. Ultrasound Dopplers of bilateral lower extremities showing no evidence of any DVT. CT chest showing no evidence of acute PE or acute thoracic aortic pathology, enlargement of cardiac chambers with no evidence of CHF, patchy areas of increased density in the lower lobe, possible subsegmental atelectasis is present. Chest x-ray showing minimal left lung base atelectasis versus fibrosis.   BRIEF HOSPITAL COURSE:  Ms. Equihua is a 80 year old, very obese, African American female with past medical history significant for atrial fibrillation and sick sinus syndrome status post pacemaker, obesity, obstructive sleep apnea, hypertension and diabetes, presents from home secondary to worsening dyspnea on exertion and also pedal edema at presentation.  1.  Acute diastolic CHF exacerbation. Echo showed mild left ventricular hypertrophy, EF was 50% to 55%. The patient was diuresed with Lasix and her creatinine bumped up to 1.5 from normal. Also of note, the patient did receive contrast for CT chest when she  came in, on top of it she was diuresed. Lasix was cut down to 20 mg p.o. daily, and she will follow up with cardiologist as an outpatient. Her symptoms have improved significantly; however, she was still complaining of dyspnea after eating her meals. Since she has not had a cardiac catheterization or other work-up for ischemic heart disease, a Lexiscan was ordered and it came back completely normal. One thought is that since the patient had a history of hiatal hernia, that could cause  just postprandial dyspnea. Other than that, her symptoms improved. Since she came she has been ambulating with minimal exertional dyspnea. She has not been regarding any oxygen, and she will be discharged with Home Health.  2.  Diabetes mellitus. The patient is on metformin and glipizide and was advised to follow her sugars at home.  3.  Hypertension and atrial fibrillation. The patient on Nebivolol and Cardizem, rate well controlled. She is status post pacemaker. She also on Coumadin for anticoagulation and her INR is therapeutic.  4.  All her other home medications were continued without further changes. CHF education packet was given to the patient. Her overall course has been otherwise uneventful.   DISCHARGE CONDITION:  Stable.   DISCHARGE DISPOSITION:  Home with Home Health.   TIME SPENT ON DISCHARGE:  40 minutes.   ____________________________ Gladstone Lighter, MD [id number] D: 09/26/2013 15:21:03 ET T: 09/26/2013 16:58:32 ET JOB#: [id number]  cc: Gladstone Lighter, MD, <Dictator> [facility] Myrla Halsted, MD Minna Merritts, MD Gladstone Lighter MD ELECTRONICALLY SIGNED 10/14/2013 13:47

## 2015-03-27 NOTE — Discharge Summary (Signed)
PATIENT NAME:  [name] [name], [name] [name] MR#:  [id number] DATE OF BIRTH:  05-08-1935  DATE OF ADMISSION:  08/03/2014 DATE OF DISCHARGE:  08/05/2014  ADDENDUM  HOSPITAL COURSE: Light headedness is acute on chronic which is multifactorial probably from recently started hydrochlorothiazide, which was discontinued. The patient was not orthostatic. For dehydration, IV fluids were provided. The patient also reported that she has chronic history of vertigo from inner ear problems. The patient was seen by ear, nose and throat physician as an outpatient in the past. They have recommended her to take meclizine on an as needed basis.   Hypokalemia. Repleted her potassium to keep her potassium as close to 4 to 5.   Insulin requiring diabetes mellitus regarding the patient was started with sliding scale insulin and the patient is to continue her home dose medications after discharge. Chronic atrial fibrillation, rate controlled. The patient is to continue her Coumadin. INR is at 1.9 today. Home health is arranged per laboratory work and will repeat PT-INR is recommended on September 3 and the results need to be faxed over to the primary care physician.   Physical therapy has evaluated the patient regarding her lightheadedness and they have recommended home physical therapy. Case management is consulted and home PT is arranged. Overall, her condition is improved. The plan was to discharge the patient home yesterday. She was complaining of abdominal pain. Abdominal x-ray has revealed a nonspecific gas pattern. The patient was given simethicone regarding her distention. The patient started feeling better today. Denies any constipation. Her last normal bowel movement was yesterday. Denies any diarrhea. Walking and ambulating in the hallway without any difficulty. Decision is made to discharge the patient home in stable condition.   MEDICATIONS AT THE TIME OF DISCHARGE:  Nexium 40 mg p.o. once daily, Olmesartan  40 mg p.o. once  daily, valium  5 mg 1 tablet p.o. at bedtime,  as needed for anxiety and nervousness, atorvastatin 20 mg 1 tablet p.o. at bedtime, Centrum silver therapeutic  multivitamins with minerals p.o. once daily, fish oil 1200 mg p.o. once a day, glipizide 10 mg p.o. once daily, Nebivolol 20 mg 1 tablet p.o. 2 times a day, Travatan 0.004% ophthalmic solution one drop each eye once a day at bedtime, Coumadin 3 mg 1 tablet p.o. once a day on Tuesday, Thursday, and Saturday and  Coumadin 1/2 tablet p.o. once a day on Sunday, Monday, Wednesday and Friday, metformin extended release 500 mg p.o. b.i.d. to start from tomorrow. Simbrinza ophthalmic suspension one drop each eye 2 times a day, meclizine 25 mg p.o. 2 times a day as needed for lightheadedness.   DIET: Low-fat, low-cholesterol, carbohydrate controlled diet. Follow up with primary care physician in a week, Dr. Nehemiah Massed in 2 weeks, ear, nose and throat physician in 1 to 2 weeks. The patient is to continue home physical therapy regarding her chronic lightheadedness. The patient has to hold her hydrochlorothiazide in the meantime until she is seen by her primary care physician.   LABORATORIES AND ADMITTING STUDIES: Troponin is less than 0.02. INR is at 1.9 today. Prothrombin time is at 21.1. The patient's BUN is 18, creatinine 1.67. Her baseline seemed to be at 1.55, which is close to her baseline. Glucose 199. Sodium is normal, potassium 3.9, chloride 102, CO2 27, anion gap is 9.  LDL is at 85, lipase 130. Abdominal x-ray erect and flat film, mild gaseous distention of the distal small bowel without obstruction. There is an area present of a focal ileus.  There are no significant air fluid levels. Cardiomegaly without failure. Arthrosclerosis. Otherwise, nonspecific bowel gas pattern without evidence for obstruction or free air.  Plan of care was discussed in detail with the patient. The patient is to be discharged with home health and home PT.     ____________________________ Nicholes Mango, MD [id number] D: 08/05/2014 14:34:35 ET T: 08/05/2014 22:40:53 ET JOB#: [id number]  cc: Nicholes Mango, MD, <Dictator> Muhammad A. Fletcher Anon, MD Unknown physician Corey Skains, MD  Nicholes Mango MD ELECTRONICALLY SIGNED 08/15/2014 15:35 Nicholes Mango MD ELECTRONICALLY SIGNED 08/15/2014 16:23

## 2015-03-27 NOTE — Consult Note (Signed)
General Aspect PCP:  [name] Brome, MD  Cardiologist:  Johnny Bridge, MD EP:  Olin Pia, MD ____________  80 y/o female with a h/o symptomatic pvc's, PAF, and SSS s/p PPM, who presented to the ED yesterday with a 1 day h/o abdominal pain, fatigue, and sharp chest pain. _____________  Past Medical History  ??? SSS (sick sinus syndrome)    a. 2008 s/p MDT PPM;  b. 11/2013 Gen change- MDT ADDRL1 Adapta DC PPM, ser # [id number] H. ??? PAF (paroxysmal atrial fibrillation)                               a. On chronic coumadin. ??? Unspecified essential hypertension  ??? Esophageal reflux  ??? Unspecified glaucoma  ??? Morbid obesity  ??? History of UTI  ??? Syncope and collapse    a. Felt to be vasovagal. ??? Hyperlipidemia  ??? OSA on CPAP  ??? Type II diabetes mellitus  ??? Anemia  ??? H/O hiatal hernia  ??? Arthritis    a. shoulders. ??? Anxiety  ??? Chronic diastolic heart failure    a. 09/2013 Echo: EF 50-55%, mild LVH, mild to mod MR/TR. ??? 1St degree AV block    a. PR 400 msec with Apacing ??? Symptomatic PVCs  ??? Chest pain    a. 2004 Cath:  reportedly nl;  b. 09/2013 Neg MV. ??? CKD III  Past Surgical History  ??? Vaginal hysterectomy  1970 ??? Incision and drainage of wound Right 03/1998   "leg; it was like a boil" (12/01/2013) ??? Insert / replace / remove pacemaker  2008; 12/01/2013   MDT ADDRL1 pacemaker - gen change by Dr Caryl Comes 12/01/2013 ??? Dilation and curettage of uterus _____________   Present Illness 80 y/o female with the above problem list.  She has a h/p PAF as well as SSS and is s/p PPM in 2008 with generator change in 11/2013.  She has not been found to have any recurrence of afib recently.  She also has chronic, symptomatic pvc's/palpitations and some degree of chronic orthostatic lightheadedness.  She was seen in clinic by Dr. Caryl Comes last week and was noted to have ankle edema.  It was felt that her diltiazem may be contributing and this was  discontinued.  HCTZ was added in its place.  She was already on lasix 20 daily prn (though she now says that she was taking it daily).  Arrangements were also made for a 30 day event monitor in an effort to try and tie her Ss of palpitations to an arrhythmia.  She says that since the change in her meds, she has noted significant reduction in lower extremity edema.  She denies any increase in her degree of orthostatic lightheadedness or palpitations.  On Sunday, she felt fatigued all day long and also noted mild diffuse lower abdominal discomfort along with intermittent, sharp, "pin-prick" like, fleeting chest pain w/o associated Ss, lasting a few seconds and resolving spontaneously.  She says that she was able to eat and drink normally throughout the day on Sunday and did not have any n, v, or d.  Yesterday, she awoke fatigued with persistent lower abd discomfort and intermittent, fleeting, sharp, chest pain.  She made an appt to see her PCP for 2 pm.  Upon seeing him, there was concern re: her ECG and she was referred to the ED.  There, ECG showed predominantly AV pacing with occas V pacing (baselin 1st deg  AVB).  Troponins were nl.  Creat was mildly elevated @ 1.67 (was 1.09 in 11/2013).  She was admitted for further eval.  No events on tele overnight. She continues to have mild, diffuse, lower abd discomfort but has not had any further chest pain.   Physical Exam:  GEN pleasant, nad.   HEENT pink conjunctivae, moist oral mucosa   NECK supple  obese, no bruits, difficult to assess jvp.   RESP normal resp effort  clear BS   CARD Regular rate and rhythm  No murmur   ABD soft  normal BS  diffuse, mild, bilat lower quadrant tenderness.   EXTR negative cyanosis/clubbing, trace bilat lee with skin changes of chronic venous stasis.  lower legs are tender bilat.   SKIN normal to palpation   NEURO cranial nerves intact, motor/sensory function intact   PSYCH alert, A+O to time, place, person    Review of Systems:  General: Fatigue   Skin: No Complaints   ENT: No Complaints   Eyes: No Complaints   Neck: No Complaints   Respiratory: No Complaints   Cardiovascular: sharp/fleeting chest pain as above.  occasional orthostatic lightheadedness.   Gastrointestinal: lower abd pain as above.  no n/v/d.   Genitourinary: No Complaints   Vascular: No Complaints   Musculoskeletal: No Complaints   Neurologic: No Complaints   Hematologic: No Complaints   Endocrine: No Complaints   Psychiatric: No Complaints   Review of Systems: All other systems were reviewed and found to be negative   Medications/Allergies Reviewed Medications/Allergies reviewed   Family & Social History:  Family and Social History:  Family History mother died of cancer @ age 35.  Father died of cardiac arrest @ 55.   Social History negative ETOH, negative Illicit drugs, previously smoked about 1/2 ppd x 15-20 yrs, quitting in 1990.   Place of Living Home  Pt lives in [place] by herself.  She does not routinely exercise.   Home Medications: Medication Instructions Status  metFORMIN extended release 500 mg oral tablet, extended release 1 tab(s) orally 2 times a day Active  olmesartan 40 mg oral tablet 1 tab(s) orally once a day Active  mometasone topical 0.1% topical cream Apply topically to affected area on ear 2 times a day, As Needed for irritation Active  Nexium 40 mg oral delayed release capsule 1 cap(s) orally once a day Active  ALPRAZolam 0.5 mg oral tablet 1 tab(s) orally once a day (at bedtime), As Needed - for Anxiety, Nervousness Active  atorvastatin 20 mg oral tablet 1 tab(s) orally once a day (at bedtime) Active  Centrum Silver Therapeutic Multiple Vitamins with Minerals oral tablet 1 tab(s) orally once a day Active  hydrochlorothiazide 25 mg oral tablet 1 tab(s) orally once a day Active  Fish Oil 1200 mg oral capsule 1 cap(s) orally once a day Active  furosemide 20 mg oral tablet  1 tab(s) orally once a day, As Needed for swelling Active  glipiZIDE 10 mg oral tablet 1 tab(s) orally once a day Active  nebivolol 20 mg oral tablet 1 tab(s) orally 2 times a day Active  Travatan 0.004% ophthalmic solution 1 drop(s) to each eye once a day (at bedtime) Active  Simbrinza 0.2%-1% ophthalmic suspension 1 drop(s) to each eye 2 times a day Active  warfarin 3 mg oral tablet 1 tab(s) orally once a day (in the evening) on Tuesday, Thursday, and Saturday Active  warfarin 3 mg oral tablet 1.5 tab(s) orally once a day (in the evening)  on Sunday, Monday, Wednesday, and Friday Active   Lab Results:  Hepatic:  31-Aug-15 15:25   Bilirubin, Total 0.5  Alkaline Phosphatase 88 (46-116 NOTE: New Reference Range 06/23/14)  SGPT (ALT) 22 (14-63 NOTE: New Reference Range 06/23/14)  SGOT (AST) 22  Total Protein, Serum 7.3  Albumin, Serum 3.4  Routine Chem:  31-Aug-15 15:25   Potassium, Serum  3.1  Lipase 130 (Result(s) reported on 03 Aug 2014 at 04:45PM.)  B-Type Natriuretic Peptide [facility])  673 (Result(s) reported on 03 Aug 2014 at 04:36PM.)  Glucose, Serum  199  BUN 18  Creatinine (comp)  1.67  Sodium, Serum 138  Chloride, Serum 102  CO2, Serum 27  Calcium (Total), Serum 9.5  Osmolality (calc) 283  eGFR (African American)  34  eGFR (Non-African American)  29 (eGFR values <55m/min/1.73 m2 may be an indication of chronic kidney disease (CKD). Calculated eGFR is useful in patients with stable renal function. The eGFR calculation will not be reliable in acutely ill patients when serum creatinine is changing rapidly. It is not useful in  patients on dialysis. The eGFR calculation may not be applicable to patients at the low and high extremes of body sizes, pregnant women, and vegetarians.)  Anion Gap 9  01-Sep-15 04:41   Cholesterol, Serum 148  Triglycerides, Serum 80  HDL (INHOUSE) 47  VLDL Cholesterol Calculated 16  LDL Cholesterol Calculated 85 (Result(s) reported on 04 Aug 2014 at 05:22AM.)  Cardiac:  31-Aug-15 15:25   Troponin I < 0.02 (0.00-0.05 0.05 ng/mL or less: NEGATIVE  Repeat testing in 3-6 hrs  if clinically indicated. >0.05 ng/mL: POTENTIAL  MYOCARDIAL INJURY. Repeat  testing in 3-6 hrs if  clinically indicated. NOTE: An increase or decrease  of 30% or more on serial  testing suggests a  clinically important change)  CK, Total 97 (26-192 NOTE: NEW REFERENCE RANGE  01/05/2014)  CPK-MB, Serum 0.6 (Result(s) reported on 03 Aug 2014 at 04:36PM.)    18:52   Troponin I < 0.02 (0.00-0.05 0.05 ng/mL or less: NEGATIVE  Repeat testing in 3-6 hrs  if clinically indicated. >0.05 ng/mL: POTENTIAL  MYOCARDIAL INJURY. Repeat  testing in 3-6 hrs if  clinically indicated. NOTE: An increase or decrease  of 30% or more on serial  testing suggests a  clinically important change)  CK, Total 102 (26-192 NOTE: NEW REFERENCE RANGE  01/05/2014)    23:09   Troponin I < 0.02 (0.00-0.05 0.05 ng/mL or less: NEGATIVE  Repeat testing in 3-6 hrs  if clinically indicated. >0.05 ng/mL: POTENTIAL  MYOCARDIAL INJURY. Repeat  testing in 3-6 hrs if  clinically indicated. NOTE: An increase or decrease  of 30% or more on serial  testing suggests a  clinically important change)  CK, Total 96 (26-192 NOTE: NEW REFERENCE RANGE  01/05/2014)  Routine UA:  31-Aug-15 16:22   Color (UA) Straw  Clarity (UA) Clear  Glucose (UA) Negative  Bilirubin (UA) Negative  Ketones (UA) Negative  Specific Gravity (UA) 1.004  Blood (UA) Negative  pH (UA) 6.0  Protein (UA) Negative  Nitrite (UA) Negative  Leukocyte Esterase (UA) Negative (Result(s) reported on 03 Aug 2014 at 05:14PM.)  RBC (UA) 1 /HPF  WBC (UA) 2 /HPF  Bacteria (UA) TRACE  Epithelial Cells (UA) NONE SEEN  Result(s) reported on 03 Aug 2014 at 05:14PM.  Routine Coag:  31-Aug-15 15:25   Prothrombin  19.4  INR 1.7 (INR reference interval applies to patients on anticoagulant therapy. A single INR  therapeutic range for  coumarins is not optimal for all indications; however, the suggested range for most indications is 2.0 - 3.0. Exceptions to the INR Reference Range may include: Prosthetic heart valves, acute myocardial infarction, prevention of myocardial infarction, and combinations of aspirin and anticoagulant. The need for a higher or lower target INR must be assessed individually. Reference: The Pharmacology and Management of the Vitamin K  antagonists: the seventh ACCP Conference on Antithrombotic and Thrombolytic Therapy. VZDGL.8756 Sept:126 (3suppl): [id number]. A HCT value >55% may artifactually increase the PT.  In one study,  the increase was an average of 25%. Reference:  "Effect on Routine and Special Coagulation Testing Values of Citrate Anticoagulant Adjustment in Patients with High HCT Values." American Journal of Clinical Pathology 2006;126:400-405.)  Routine Hem:  31-Aug-15 15:25   WBC (CBC) 8.5  RBC (CBC) 4.90  Hemoglobin (CBC)  10.9  Hematocrit (CBC)  34.5  Platelet Count (CBC) 230 (Result(s) reported on 03 Aug 2014 at 04:09PM.)  MCV  70  MCH  22.2  MCHC  31.6  RDW  16.6   EKG:  EKG Interp. by me   Interpretation predominantly AV paced with 3 beats of V pacing in setting of native p wave and 1st deg AVB. 79 bpm. no acute changes.   Radiology Results: XRay:    31-Aug-15 15:54, Chest PA and Lateral  Chest PA and Lateral   REASON FOR EXAM:    Altered Mental Status  COMMENTS:   May transport without cardiac monitor    PROCEDURE: DXR - DXR CHEST PA (OR AP) AND LATERAL  - Aug 03 2014  3:54PM     CLINICAL DATA:  Week dizzy for 4 days history of atrial fibrillation    EXAM:  CHEST  2 VIEW    COMPARISON:  03/30/2014    FINDINGS:  Moderate cardiac enlargement. Vascular pattern normal. Cardiac pacer  stable. No consolidation or effusion. 2 cm calcified right lower  lobe lesion is stable when compared to multiple prior studies.     IMPRESSION:  No  acute findings      Electronically Signed    By: Skipper Cliche M.D.    On: 08/03/2014 16:01         Verified By: Rachael Fee, M.D.,    No Known Allergies:   Vital Signs/Nurse's Notes: **Vital Signs.:   01-Sep-15 05:13  Vital Signs Type Routine  Temperature Temperature (F) 97.4  Celsius 36.3  Temperature Source oral  Pulse Pulse 80  Respirations Respirations 18  Systolic BP Systolic BP 433  Diastolic BP (mmHg) Diastolic BP (mmHg) 81  Mean BP 100  Pulse Ox % Pulse Ox % 95  Pulse Ox Activity Level  At rest  Oxygen Delivery Room Air/ 21 %    08:46  Vital Signs Type Recheck  Pulse Pulse 71  Systolic BP Systolic BP 295  Diastolic BP (mmHg) Diastolic BP (mmHg) 67  Mean BP 80  Systolic BP Systolic BP 188  Diastolic BP (mmHg) Diastolic BP (mmHg) 67  Systolic BP Systolic BP 416  Diastolic BP (mmHg) Diastolic BP (mmHg) 80  Pulse Pulse Sitting 77  Systolic BP Systolic BP 606  Diastolic BP (mmHg) Diastolic BP (mmHg) 71  Pulse Standing Pulse Standing 71    Impression 1.  Fatigue:  Etiology unclear.  Mild renal insuff and hypokalemia noted on admission in setting of recent addition of HCTZ on top of daily lasix.  Mild dehydration may be playing a role.  She also c/o lower abdominal discomfort w/o n,  v, diarrhea.  No urinary Ss.  No clear cardiac etiology as she is paced on tele and hemodynamically stable.  2.  Atypical Chest Pain:  In setting of fatigue and abd discomfort, she also noted intermittent, fleeting, sharp, midsternal chest pain, lasting a few secs @ a time, w/o associated Ss, and resolving spontaneously.  This started on Sunday and continued into yesterday.  No c/p this morning.  Very atypical Ss w/o objective evidence of ischemia.  Trops nl.  Neg MV last October.  Nl EF by echo last October as well.  No plans for further ischemic eval at this time.  3.  Acute lower abdominal pain:  This has been persistent since Sunday morning.  She continues to have mild,  diffuse tenderness this morning.  Etiology unclear.  Defer decisions re: imaging to IM.  4.  PAF:  In sinus.  Cont BB/coumadin.  5.  CKD III:  Creat was nl in 11/2013.  Suspect mild dehydration.  Agree with holding/discontinuing HCTZ since she is taking lasix @ home already.  6.  Chronic lightheadedness:  Not orthostatic by VS here.  Ss appear to be intermittent.  She has apparently been told that she has vertigo by ENT in the past.  PT seeing.  7.  SSS:  Paced on tele.  Nl device fxn by recent interrogation on 8/26.  8.  Hypokalemia:  Supplemented in ED.  9.  HTN:  Stable on bb/arb.   Plan Attending Note:  The patient was seen and examined.  changes were made to the above note where needed.  her symptoms of CP are very atypical and are not at all suggestive of cardiac ischemia.  Pacer appears to be working well.  would continue to collect troponin levels.  Lungs are clear - no evidence of CHF I'm more concerned about her hx of abdominal pain and fatigue than a cardiac issue.    No further recs.   Mertie Moores, MD.   Electronic Signatures: Rogelia Mire (NP)  (Signed 01-Sep-15 11:05)  Authored: General Aspect/Present Illness, History and Physical Exam, Review of System, Family & Social History, Home Medications, Labs, EKG , Radiology, Allergies, Vital Signs/Nurse's Notes, Impression/Plan Nahser, Dreama Saa (MD)  (Signed 01-Sep-15 13:27)  Authored: Impression/Plan  Co-Signer: General Aspect/Present Illness, Home Medications, Allergies   Last Updated: 01-Sep-15 13:27 by Orbie Hurst (MD)

## 2015-03-27 NOTE — Discharge Summary (Signed)
PATIENT NAME:  [name] [name], [name] [name] MR#:  [id number] DATE OF BIRTH:  [birth year]  DATE OF ADMISSION:  08/03/2014  DATE OF DISCHARGE:  08/05/2014   CHIEF COMPLAINT: At the time of admission: Chest pain and lightheadedness.   ADMITTING DIAGNOSES:  1.  Chest pain.  2.  Lightheadedness. 3.  Hypokalemia.   DISCHARGE DIAGNOSES:  1.  Chest pain, atypical, resolved.  2.  Lightheadedness.  3.  Hypokalemia .   SECONDARY DISCHARGE DIAGNOSES:  1.  Non-insulin-requiring diabetes mellitus. 2.  Chronic atrial fibrillation on Coumadin.   CONSULTATIONS: Cardiology, Dr. Tyrell Antonio group.   PROCEDURES: None.   BRIEF HISTORY AND HOSPITAL COURSE:  The patient is a 80 year old African American female with a history of atrial fibrillation, sick sinus syndrome, came into the ED with a chief complaint of chest pain and dizziness. Please review history and physical for details. The patient is admitted to the hospital to rule out acute MI, and to further evaluate her lightheadedness.   Cardiac enzymes were trended and acute MI is ruled out. Chest pain was assumed to be atypical. Cardiology has recommended to continue medical management and they did not have any other recommendations.     ____________________________ Nicholes Mango, MD ag:DT D: 08/05/2014 14:27:08 ET T: 08/05/2014 17:49:58 ET JOB#: [id number]  cc: Nicholes Mango, MD, <Dictator> Nicholes Mango MD ELECTRONICALLY SIGNED 08/15/2014 15:35 Nicholes Mango MD ELECTRONICALLY SIGNED 08/15/2014 16:23

## 2015-03-27 NOTE — H&P (Signed)
PATIENT NAME:  [name] [name], [name] [name] MR#:  [id number] DATE OF BIRTH:  [birth year]  DATE OF ADMISSION:  08/03/2014  REFERRING PHYSICIAN: Debbrah Alar, MD  PRIMARY CARE PHYSICIAN: Rochel Brome, MD   CARDIOLOGIST: Minna Merritts, MD  CHIEF COMPLAINT: Chest pain and dizziness.  HISTORY OF PRESENT ILLNESS: A 80 year old African American female with history of atrial fibrillation, sick sinus syndrome, permanent pacemaker placement, type 2 diabetes, uncomplicated, hyperlipidemia, presenting with chest pain and dizziness. She was recently started on hydrochlorothiazide about 1 week ago and now describes a 4-day duration of lightheadedness on standing with associated dyspnea on exertion. Denies any fevers, chills, cough. She noted having intermittent chest pain today, retrosternal in location, described only as "pain" 6 to 7/10 in intensity. No radiation. No worsening or relieving factors. Presented to her PCP's office,  thought to have an abnormal EKG, thus sent to the hospital for further workup and evaluation.   REVIEW OF SYSTEMS:  CONSTITUTIONAL: Denies fever or chills. Positive for fatigue, weakness.  EYES: Denies blurry vision, double vision, eye pain.  EARS, NOSE, THROAT: Denies tinnitus, ear pain, hearing loss.  RESPIRATORY: Denies cough, wheeze, shortness of breath, other than dyspnea on exertion as described above.  CARDIOVASCULAR: Positive for chest pain as described above. Denies palpitations, edema.  GASTROINTESTINAL: Denies nausea, vomiting, diarrhea, abdominal pain.  GENITOURINARY: Denies dysuria, hematuria.  ENDOCRINE: Denies nocturia or thyroid problems.  HEMATOLOGIC AND LYMPHATIC: Denies easy bruising, bleeding.  SKIN: Denies rash or lesion.  MUSCULOSKELETAL: Denies pain in neck, back, shoulder, knees, hips or arthritic symptoms.  NEUROLOGIC: Denies paralysis, paresthesias.  PSYCHIATRIC: Denies anxiety or depressive symptoms. Otherwise, full review of systems performed by me is  negative.   PAST MEDICAL HISTORY: Atrial fibrillation, sick sinus syndrome, status post permanent pacemaker insertion, type 2 diabetes non-insulin-requiring uncomplicated, hyperlipidemia, GERD, obstructive sleep apnea on CPAP therapy, chronic kidney disease stage 3.   SOCIAL HISTORY: Denies alcohol, tobacco or drug usage.   FAMILY HISTORY: Positive for coronary artery disease.   ALLERGIES: No known drug allergies.   HOME MEDICATIONS: Include olmesartan 40 mg p.o. daily. Warfarin 3 mg p.o. Tuesday, Thursday, Saturday and 3 mg 1.5 tablets on Sunday, Monday, Wednesday. Glipizide 10 mg p.o. daily, metformin 500 mg p.o. b.i.d., atorvastatin 20 mg p.o. at bedtime, alprazolam 0.5 mg p.o. at bedtime as needed for anxiety, nebivolol 20 mg p.o. b.i.d., mometasone topical 0.1% cream to area on ear b.i.d. as needed for irritation, Lasix 20 mg p.o. daily p.r.n. as needed for edema, hydrochlorothiazide 25 mg p.o. daily, fish oil 1200 mg p.o. daily, Simbrinza 0.2/1% ophthalmic suspension 1 drop to each eye b.i.d., Travatan 0.004% ophthalmic solution 1 drop at bedtime, Nexium 40 mg p.o. daily, Centrum Silver multivitamin 1 tablet p.o. daily.   PHYSICAL EXAMINATION:  VITAL SIGNS: Temperature 97.9, heart rate 72, respirations 22, blood pressure 153/72, saturating 98% in room air. Weight 111.6 kg, BMI 43.6.  GENERAL: Well-nourished, well-developed, African American female, currently in no acute distress.  HEAD: Normocephalic, atraumatic.  EYES: Pupils equal, round, reactive to light. Extraocular muscles intact. No scleral icterus.  MOUTH: Dry mucosal membrane. Dentition intact. No abscess noted.  EARS, NOSE, THROAT: Clear without exudates. No external lesions.  NECK: Supple. No thyromegaly. No nodules. No JVD.  PULMONARY: Clear to auscultation bilaterally without wheezes rales, rhonchi. No use of accessory muscles. Good respiratory effort. CHEST: Nontender to palpation.  CARDIOVASCULAR: S1, S2, regular rate and  rhythm. No murmurs, rubs or gallops. No edema. Pedal pulses 2+ bilaterally.  GASTROINTESTINAL: Soft, nontender, nondistended. No masses. Positive bowel sounds. No hepatosplenomegaly.  MUSCULOSKELETAL: No swelling, clubbing, edema. Range of motion full in all extremities.  NEUROLOGIC: Cranial nerves II-XII intact. No gross focal or neurological deficits. Sensation intact. Reflexes intact. SKIN: No ulcerations, rashes, cyanosis. Skin warm, dry. Turgor intact.  PSYCHIATRIC: Mood and affect within normal limits. Awake and oriented x 3. Insight and judgment intact.  IMAGING: EKG performed reveals a sensed V-paced rhythm. Chest x-ray performed reveals no acute cardiopulmonary findings.   LABORATORY DATA: Sodium 138, potassium 3.1, chloride 102, bicarbonate 27, BUN 18, creatinine 1.6, glucose 199. LFTs within normal limits. Troponin less than 0.02. WBC 8.5, hemoglobin 10.9, platelets of 230,000. Urinalysis negative for evidence of infection.   ASSESSMENT AND PLAN: A 80 year old African American female with history of atrial fibrillation, sick sinus syndrome, status post permanent pacemaker presenting with chest pain as well as dizziness.  1.  Chest pain. Admit to telemetry under observational status. Initiate aspirin and statin therapy. Trend cardiac enzymes x 3.  2.  Lightheadedness. Appears given her description it does sound orthostatic in nature. Check orthostatic vital signs, hold diuretics. Get a physical therapy evaluation.  3.  Hypokalemia. Replace to goal of 4 to 5.  4.  Type 2 diabetes non-insulin-requiring, uncomplicated. Hold p.o. agents. Add insulin sliding scale, q. 6 hour Accu-Cheks.  5.  Atrial fibrillation. She is status post permanent pacemaker insertion. Continue warfarin for anticoagulation. 6.  Venous thromboembolism prophylaxis, on warfarin.  CODE STATUS: The patient is a full code.   TIME SPENT: Forty-five minutes.    ____________________________ Aaron Mose. Zion Lint,  MD dkh:TT D: 08/03/2014 22:53:05 ET T: 08/03/2014 23:26:41 ET JOB#: [id number]  cc: Aaron Mose. Versie Soave, MD, <Dictator> Haeley [place] Woodfin Ganja MD ELECTRONICALLY SIGNED 08/04/2014 1:46

## 2015-03-29 LAB — SURGICAL PATHOLOGY

## 2015-03-30 DIAGNOSIS — E1129 Type 2 diabetes mellitus with other diabetic kidney complication: Secondary | ICD-10-CM | POA: Diagnosis not present

## 2015-04-07 DIAGNOSIS — Z7901 Long term (current) use of anticoagulants: Secondary | ICD-10-CM | POA: Diagnosis not present

## 2015-04-07 DIAGNOSIS — Z5181 Encounter for therapeutic drug level monitoring: Secondary | ICD-10-CM | POA: Diagnosis not present

## 2015-04-08 DIAGNOSIS — E11329 Type 2 diabetes mellitus with mild nonproliferative diabetic retinopathy without macular edema: Secondary | ICD-10-CM | POA: Diagnosis not present

## 2015-04-13 DIAGNOSIS — E119 Type 2 diabetes mellitus without complications: Secondary | ICD-10-CM | POA: Diagnosis not present

## 2015-04-13 LAB — HEMOGLOBIN A1C: Hgb A1c MFr Bld: 8.1 % — AB (ref 4.0–6.0)

## 2015-04-14 ENCOUNTER — Encounter: Payer: Self-pay | Admitting: Pulmonary Disease

## 2015-04-14 ENCOUNTER — Ambulatory Visit (INDEPENDENT_AMBULATORY_CARE_PROVIDER_SITE_OTHER): Payer: Medicare Other | Admitting: Pulmonary Disease

## 2015-04-14 VITALS — BP 140/68 | HR 88 | Temp 98.5°F | Ht 65.0 in | Wt 242.2 lb

## 2015-04-14 DIAGNOSIS — G4733 Obstructive sleep apnea (adult) (pediatric): Secondary | ICD-10-CM | POA: Diagnosis not present

## 2015-04-14 DIAGNOSIS — G2581 Restless legs syndrome: Secondary | ICD-10-CM

## 2015-04-14 MED ORDER — ROPINIROLE HCL 0.5 MG PO TABS: ORAL_TABLET | ORAL | Status: DC

## 2015-04-14 NOTE — Assessment & Plan Note (Signed)
The patient gives a classic history for RLS, and perhaps this is responsible for her restless sleep and disruption during the night. I would like to give her a trial of Requip to see if she sees a difference.

## 2015-04-14 NOTE — Progress Notes (Signed)
Subjective:    Patient ID: [name] [name], female    DOB: March 06, 1935, 80 y.o.   MRN: [id number]  HPI The patient is a 80 year old female who I've been asked to see for management of obstructive sleep apnea. She was diagnosed years ago with sleep apnea, and currently is on C Pap with a device that she has used for the last 8 years. She had a follow-up study in 2014 that showed an AHI of 7 events per hour. The patient has done very well with her C Pap over the years, but most recently has been having frequent awakenings and restless sleep. She does not feel rested in the mornings upon arising. She denies any significant inappropriate daytime sleepiness issues, however her Epworth score is 15. She has been keeping up with her mask changes and supplies, and feels the pressure is adequate. Part of her issue is getting back to sleep once she awakens during the night. She describes classic RLS symptoms in the evenings while sitting in a chair, and will often rub her legs down with alcohol or some other kind of lotion. The only thing that makes her legs feel better his actual movement. She is unsure if she kicks during the night. The patient's weight is actually down 20 pounds over the last 2 years.   Sleep Questionnaire What time do you typically go to bed?( Between what hours) 11:30p 11:30p at 1606 on 04/14/15 by Inge Rise, CMA How long does it take you to fall asleep? 20-30 min if she takes her xanax 20-30 min if she takes her xanax at 1606 on 04/14/15 by Inge Rise, CMA How many times during the night do you wake up? 2 2 at 1606 on 04/14/15 by Inge Rise, CMA What time do you get out of bed to start your day? 0530 0530 at 1606 on 04/14/15 by Inge Rise, CMA Do you drive or operate heavy machinery in your occupation? No No at 1606 on 04/14/15 by Inge Rise, CMA How much has your weight changed (up or down) over the past two years? (In pounds) 20 lb (9.072 kg) 20 lb (9.072 kg) at  1606 on 04/14/15 by Inge Rise, CMA Have you ever had a sleep study before? Yes Yes at 1606 on 04/14/15 by Inge Rise, CMA If yes, location of study? [facility] at 1606 on 04/14/15 by Inge Rise, CMA If yes, date of study? 2008 and 2014 2008 and 2014 at [place] on 04/14/15 by Inge Rise, CMA Do you currently use CPAP? Yes Yes at 1606 on 04/14/15 by Inge Rise, CMA If so, what pressure? 13 13 at 1606 on 04/14/15 by Inge Rise, CMA Do you wear oxygen at any time? No No at 1606 on 04/14/15 by Inge Rise, CMA   Review of Systems  Constitutional: Positive for unexpected weight change. Negative for fever.  HENT: Negative for congestion, dental problem, ear pain, nosebleeds, postnasal drip, rhinorrhea, sinus pressure, sneezing, sore throat and trouble swallowing.   Eyes: Negative for redness and itching.  Respiratory: Positive for cough and shortness of breath. Negative for chest tightness and wheezing.   Cardiovascular: Positive for leg swelling. Negative for palpitations.  Gastrointestinal: Positive for abdominal pain. Negative for nausea and vomiting.  Genitourinary: Negative for dysuria.  Musculoskeletal: Positive for arthralgias. Negative for joint swelling.  Skin: Negative for rash.  Neurological: Negative for headaches.  Hematological: Does not bruise/bleed easily.  Psychiatric/Behavioral: Negative  for dysphoric mood. The patient is nervous/anxious.        Objective:   Physical Exam Constitutional:  Obese female, no acute distress  HENT:  Nares patent without discharge  Oropharynx without exudate, palate and uvula are elongated, large beefy tongue  Eyes:  Perrla, eomi, no scleral icterus  Neck:  No JVD, no TMG  Cardiovascular:  Normal rate, regular rhythm, no rubs or gallops.  No murmurs        Intact distal pulses  Pulmonary :  Normal breath sounds, no stridor or respiratory distress   No rales, rhonchi, or wheezing  Abdominal:   Soft, nondistended, bowel sounds present.  No tenderness noted.   Musculoskeletal:  mild lower extremity edema noted.  Lymph Nodes:  No cervical lymphadenopathy noted  Skin:  No cyanosis noted  Neurologic:  Alert, appropriate, moves all 4 extremities without obvious deficit.         Assessment & Plan:

## 2015-04-14 NOTE — Patient Instructions (Signed)
Will order you a new cpap machine, and will set on the auto setting. Keep working on weight loss Try requip 0.5mg  after dinner each night for your leg symptoms.  If helps, but not completely, can increase to 2 after dinner.  followup with me again in 4 weeks to check on progress (before June 10).

## 2015-04-14 NOTE — Assessment & Plan Note (Signed)
The patient has a history of sleep apnea dating back to 2014, and has done very well with her C Pap device. Most recently, she has had restless sleep with frequent awakenings, and is unsure why this is. She uses a full face mask, and has kept up with her cushion changes and supplies, and her weight is actually gone down 20 pounds over the last 2 years. Her C Pap device is 78 years old, and she is concerned that it is not working properly. At this point, I would like to get her a new C Pap device, and treat her with the auto setting. I have encouraged her to continue working on weight loss.

## 2015-04-16 ENCOUNTER — Telehealth: Payer: Self-pay | Admitting: Pulmonary Disease

## 2015-04-16 NOTE — Telephone Encounter (Signed)
Spoke with pt. States that Requip is making her sick to her stomach. She has not vomited but she has come close. Currently taking just 1 tablet after dinner. Would like KC's recommendations on what to do.  [place]- please advise. Thanks.

## 2015-04-16 NOTE — Telephone Encounter (Signed)
Spoke with patient, states that she has been taking Requip on a full stomach and is still feeling nauseated. Pt states that she wakes up in the AM with little nausea - feels left over from the Requip side effect. Pt aware to stop this medication for now. Aware that we will call her back if any alternatives offered.  Will send to Dr Gwenette Greet as Juluis Rainier.

## 2015-04-16 NOTE — Telephone Encounter (Signed)
Make sure she is taking on a full stomach.  If she is, would just d/c the medication.  Most of the drugs in the same family also can produce nausea.

## 2015-04-16 NOTE — Telephone Encounter (Signed)
Would like for her to stay off meds for now.  If nausea resolves we could try a different med in a few weeks.  Let us know when completely better.

## 2015-04-19 NOTE — Telephone Encounter (Signed)
Pt is aware of KC's recommendation. Nothing further was needed at this time.

## 2015-04-19 NOTE — Telephone Encounter (Signed)
lmtcb x1 for pt. 

## 2015-04-26 DIAGNOSIS — E1165 Type 2 diabetes mellitus with hyperglycemia: Secondary | ICD-10-CM | POA: Diagnosis not present

## 2015-04-30 DIAGNOSIS — I10 Essential (primary) hypertension: Secondary | ICD-10-CM | POA: Diagnosis not present

## 2015-04-30 DIAGNOSIS — H409 Unspecified glaucoma: Secondary | ICD-10-CM | POA: Diagnosis not present

## 2015-04-30 DIAGNOSIS — H2513 Age-related nuclear cataract, bilateral: Secondary | ICD-10-CM | POA: Diagnosis not present

## 2015-04-30 DIAGNOSIS — H02839 Dermatochalasis of unspecified eye, unspecified eyelid: Secondary | ICD-10-CM | POA: Diagnosis not present

## 2015-05-12 ENCOUNTER — Encounter: Payer: Self-pay | Admitting: Pulmonary Disease

## 2015-05-12 ENCOUNTER — Ambulatory Visit (INDEPENDENT_AMBULATORY_CARE_PROVIDER_SITE_OTHER): Payer: Medicare Other | Admitting: Pulmonary Disease

## 2015-05-12 VITALS — BP 124/72 | HR 82 | Temp 97.3°F | Ht 65.0 in | Wt 244.4 lb

## 2015-05-12 DIAGNOSIS — G2581 Restless legs syndrome: Secondary | ICD-10-CM | POA: Diagnosis not present

## 2015-05-12 DIAGNOSIS — G4733 Obstructive sleep apnea (adult) (pediatric): Secondary | ICD-10-CM

## 2015-05-12 NOTE — Assessment & Plan Note (Signed)
The patient has a classic history for RLS, but had difficulty with the dopamine agonist because of nausea. She does not feel that it is that much of an issue, and we'll therefore hold off on further treatment and see how she responds. She is to let us know if her symptoms worsen, and she wishes to try a different medication.

## 2015-05-12 NOTE — Assessment & Plan Note (Signed)
The patient has just received her new C Pap device, and more it last night for the first time. She is very happy with this, and felt she slept all night and awoke refreshed this morning. I asked her to continue on her device, and to work aggressively on weight loss.

## 2015-05-12 NOTE — Patient Instructions (Signed)
Stay on your cpap, and let us know if there are any issues. Ok to stay off medication for restless legs, but let us know if they are giving you problems, and we can try something different. Work on weight loss. followup with Dr. Halford Chessman in 72mos

## 2015-05-12 NOTE — Progress Notes (Signed)
   Subjective:    Patient ID: [name] [name], female    DOB: 03/15/1935, 80 y.o.   MRN: [id number]  HPI The patient comes in today for follow-up of her obstructive sleep apnea. At the last visit, she had a very aged C Pap machine that needed to be replaced. She just received her new device yesterday, and last night had very good sleep without issues. She feels that her mask is very comfortable, and had no issues with the pressure. The patient also has a history of the restless leg syndrome, and was tried on a dopamine agonist. However, she was not able to tolerate this because of GI issues.   Review of Systems  Constitutional: Negative for fever and unexpected weight change.  HENT: Negative for congestion, dental problem, ear pain, nosebleeds, postnasal drip, rhinorrhea, sinus pressure, sneezing, sore throat and trouble swallowing.   Eyes: Negative for redness and itching.  Respiratory: Negative for cough, chest tightness, shortness of breath and wheezing.   Cardiovascular: Negative for palpitations and leg swelling.  Gastrointestinal: Negative for nausea and vomiting.  Genitourinary: Negative for dysuria.  Musculoskeletal: Negative for joint swelling.  Skin: Negative for rash.  Neurological: Negative for headaches.  Hematological: Does not bruise/bleed easily.  Psychiatric/Behavioral: Negative for dysphoric mood. The patient is not nervous/anxious.        Objective:   Physical Exam Obese female in no acute distress Nose without purulence or discharge noted Neck without lymphadenopathy or thyromegaly No skin breakdown or pressure necrosis from the C Pap mask Lower extremities with 1+ edema, no cyanosis Alert and oriented, moves all 4 extremities.       Assessment & Plan:

## 2015-05-13 ENCOUNTER — Ambulatory Visit: Payer: Medicare Other | Admitting: *Deleted

## 2015-05-14 ENCOUNTER — Other Ambulatory Visit: Payer: Self-pay

## 2015-05-14 DIAGNOSIS — E1165 Type 2 diabetes mellitus with hyperglycemia: Secondary | ICD-10-CM

## 2015-05-14 DIAGNOSIS — IMO0002 Reserved for concepts with insufficient information to code with codable children: Secondary | ICD-10-CM

## 2015-05-14 DIAGNOSIS — I10 Essential (primary) hypertension: Secondary | ICD-10-CM

## 2015-05-14 DIAGNOSIS — K219 Gastro-esophageal reflux disease without esophagitis: Secondary | ICD-10-CM

## 2015-05-14 MED ORDER — GLUCOSE BLOOD VI STRP: ORAL_STRIP | Status: DC

## 2015-05-14 MED ORDER — METFORMIN HCL ER 500 MG PO TB24: 500.0000 mg | ORAL_TABLET | Freq: Two times a day (BID) | ORAL | Status: DC

## 2015-05-14 MED ORDER — ESOMEPRAZOLE MAGNESIUM 40 MG PO CPDR: 40.0000 mg | DELAYED_RELEASE_CAPSULE | Freq: Every day | ORAL | Status: DC

## 2015-05-14 MED ORDER — AMLODIPINE BESYLATE 5 MG PO TABS: 5.0000 mg | ORAL_TABLET | Freq: Every day | ORAL | Status: DC

## 2015-05-14 NOTE — Telephone Encounter (Signed)
This was a faxed rx request for patients medication.

## 2015-05-18 ENCOUNTER — Encounter: Payer: Self-pay | Admitting: Family Medicine

## 2015-05-18 ENCOUNTER — Ambulatory Visit (INDEPENDENT_AMBULATORY_CARE_PROVIDER_SITE_OTHER): Payer: Medicare Other | Admitting: Family Medicine

## 2015-05-18 VITALS — BP 162/80 | HR 90 | Resp 16 | Ht 65.0 in | Wt 243.0 lb

## 2015-05-18 DIAGNOSIS — Z7901 Long term (current) use of anticoagulants: Secondary | ICD-10-CM | POA: Diagnosis not present

## 2015-05-18 DIAGNOSIS — R9431 Abnormal electrocardiogram [ECG] [EKG]: Secondary | ICD-10-CM

## 2015-05-18 DIAGNOSIS — I10 Essential (primary) hypertension: Secondary | ICD-10-CM | POA: Diagnosis not present

## 2015-05-18 DIAGNOSIS — I480 Paroxysmal atrial fibrillation: Secondary | ICD-10-CM | POA: Diagnosis not present

## 2015-05-18 MED ORDER — OLMESARTAN MEDOXOMIL 40 MG PO TABS: 40.0000 mg | ORAL_TABLET | Freq: Every day | ORAL | Status: DC

## 2015-05-18 MED ORDER — NEBIVOLOL HCL 20 MG PO TABS: 20.0000 mg | ORAL_TABLET | Freq: Two times a day (BID) | ORAL | Status: DC

## 2015-05-18 MED ORDER — AMLODIPINE BESYLATE 5 MG PO TABS: 7.5000 mg | ORAL_TABLET | Freq: Every day | ORAL | Status: DC

## 2015-05-18 NOTE — Progress Notes (Signed)
Name: [name] [name]   MRN: [id number]    DOB: 09/12/1935   Date:05/18/2015       Progress Note  Subjective  Chief Complaint  Chief Complaint  Patient presents with  . Hypertension    Patient reports BP elevated for 4 days    Hypertension This is a chronic problem. The problem has been gradually worsening since onset. The problem is uncontrolled (Reports that her blood pressure has been elevated for the last 3-4 days). Associated symptoms include malaise/fatigue and palpitations (usually have palpitations in AM if she wakes up early morning. This is improved since she started using the CPAP machine.). Pertinent negatives include no anxiety, blurred vision, chest pain, headaches, neck pain or orthopnea. Past treatments include angiotensin blockers, beta blockers and calcium channel blockers. Hypertensive end-organ damage includes kidney disease and heart failure. There is no history of angina, CAD/MI or CVA.    Past Medical History  Diagnosis Date  . SSS (sick sinus syndrome)     a. 2008 s/p MDT PPM;  b. 11/2013 Gen change- MDT ADDRL1 Adapta DC PPM, ser # [id number] H.  Marland Kitchen PAF (paroxysmal atrial fibrillation)   . Unspecified essential hypertension   . Esophageal reflux   . Unspecified glaucoma   . Morbid obesity   . History of UTI   . Syncope and collapse     a. Felt to be vasovagal.  . Hyperlipidemia   . OSA on CPAP   . Type II diabetes mellitus   . Anemia   . H/O hiatal hernia   . Arthritis     a. shoulders.  . Anxiety   . Chronic diastolic heart failure     a. 09/2013 Echo: EF 50-55%, mild LVH, mild to mod MR/TR.  . 1St degree AV block     a. PR 400 msec with Apacing  . Symptomatic PVCs   . Chest pain     a. 2004 Cath:  reportedly nl;  b. 09/2013 Neg MV.    Past Surgical History  Procedure Laterality Date  . Vaginal hysterectomy  1970  . Incision and drainage of wound Right 03/1998    "leg; it was like a boil" (12/01/2013)  . Insert / replace / remove pacemaker  2008;  12/01/2013    MDT ADDRL1 pacemaker - gen change by Dr Caryl Comes 12/01/2013  . Dilation and curettage of uterus    . Permanent pacemaker generator change N/A 12/01/2013    Procedure: PERMANENT PACEMAKER GENERATOR CHANGE;  Surgeon: Deboraha Sprang, MD;  Location: [facility] CATH LAB;  Service: Cardiovascular;  Laterality: N/A;  . Lung biopsy  2010    [facility]    Family History  Problem Relation Age of Onset  . Liver cancer Mother   . Pancreatic cancer Mother   . Heart disease Father   . Rheum arthritis Father   . Allergies Father     History   Social History  . Marital Status: Widowed    Spouse Name: N/A  . Number of Children: Y  . Years of Education: N/A   Occupational History  . retired    Social History Main Topics  . Smoking status: Former Smoker -- 0.50 packs/day for 34 years    Types: Cigarettes    Quit date: 08/15/1989  . Smokeless tobacco: Never Used  . Alcohol Use: No  . Drug Use: No  . Sexual Activity: No   Other Topics Concern  . Not on file   Social History Narrative   Retired. Widowed. Regularly  exercises.      Current outpatient prescriptions:  .  ALPRAZolam (XANAX) 0.5 MG tablet, Take 0.5 mg by mouth daily as needed. , Disp: , Rfl:  .  amLODipine (NORVASC) 5 MG tablet, Take 1.5 tablets (7.5 mg total) by mouth daily. 1.5 tabs po q day., Disp: 90 tablet, Rfl: 0 .  atorvastatin (LIPITOR) 20 MG tablet, Take 20 mg by mouth daily., Disp: , Rfl:  .  Blood Glucose Monitoring Suppl (ACCU-CHEK AVIVA PLUS) W/DEVICE KIT, daily. , Disp: , Rfl:  .  esomeprazole (NEXIUM) 40 MG capsule, Take 1 capsule (40 mg total) by mouth daily., Disp: 90 capsule, Rfl: 0 .  furosemide (LASIX) 20 MG tablet, Take 20 mg by mouth daily as needed for fluid., Disp: , Rfl:  .  glipiZIDE (GLUCOTROL) 10 MG tablet, Take 10 mg by mouth 2 (two) times daily before a meal., Disp: , Rfl:  .  glucose blood (ACCU-CHEK AVIVA PLUS) test strip, Test Twice daily, Disp: 200 each, Rfl: 0 .  metFORMIN (GLUCOPHAGE-XR) 500  MG 24 hr tablet, Take 1 tablet (500 mg total) by mouth 2 (two) times daily., Disp: 180 tablet, Rfl: 0 .  MICROLET LANCETS MISC, as needed. , Disp: , Rfl:  .  mometasone (ELOCON) 0.1 % cream, Apply topically daily. Apply to ear as directed two times a day for 10 days as needed   , Disp: , Rfl:  .  Multiple Vitamins-Minerals (CENTRUM SILVER PO), Take by mouth daily., Disp: , Rfl:  .  Nebivolol HCl (BYSTOLIC) 20 MG TABS, Take 1 tablet (20 mg total) by mouth 2 (two) times daily., Disp: 180 tablet, Rfl: 3 .  olmesartan (BENICAR) 40 MG tablet, Take 1 tablet (40 mg total) by mouth daily., Disp: 90 tablet, Rfl: 0 .  Omega-3 Fatty Acids (FISH OIL) 1200 MG CAPS, Take 1,200 mg by mouth daily., Disp: , Rfl:  .  SIMBRINZA 1-0.2 % SUSP, Place 1 drop into both eyes 2 (two) times daily. , Disp: , Rfl:  .  travoprost, benzalkonium, (TRAVATAN) 0.004 % ophthalmic solution, Place 1 drop into both eyes at bedtime. , Disp: , Rfl:  .  warfarin (COUMADIN) 3 MG tablet, Take 3-4.5 mg by mouth daily at 6 PM. Takes 44m on Tues, Thurs, Sat and Sun. Takes 4.572mon Mon, Wed and Friday., Disp: , Rfl:   No Known Allergies   Review of Systems  Constitutional: Positive for malaise/fatigue.  Eyes: Negative for blurred vision.  Cardiovascular: Positive for palpitations (usually have palpitations in AM if she wakes up early morning. This is improved since she started using the CPAP machine.). Negative for chest pain and orthopnea.  Musculoskeletal: Negative for neck pain.  Neurological: Negative for headaches.      Objective  Filed Vitals:   05/18/15 0957 05/18/15 1030  BP: 150/70 162/80  Pulse: 90   Resp: 16   Height: '5\' 5"'  (1.651 m)   Weight: 243 lb (110.224 kg)   SpO2: 94%     Physical Exam  Constitutional: She is oriented to person, place, and time and well-developed, well-nourished, and in no distress.  HENT:  Head: Normocephalic and atraumatic.  Cardiovascular: Normal rate and regular rhythm.    Pulmonary/Chest: Effort normal and breath sounds normal.  Abdominal: Soft. Bowel sounds are normal.  Musculoskeletal:       Right ankle: She exhibits normal range of motion and no swelling.       Left ankle: She exhibits normal range of motion and no swelling.  Neurological:  She is alert and oriented to person, place, and time.  Skin: Skin is warm and dry.  Nursing note and vitals reviewed.         Assessment & Plan 1. Essential hypertension Blood pressure elevated on manual repeat. We will increase amlodipine to 7.5 mg daily. Patient will continue checking her blood pressure and follow-up in 4 weeks. - amLODipine (NORVASC) 5 MG tablet; Take 1.5 tablets (7.5 mg total) by mouth daily. 1.5 tabs po q day.  Dispense: 90 tablet; Refill: 0 - Comprehensive Metabolic Panel (CMET) - EKG 12-Lead - Nebivolol HCl (BYSTOLIC) 20 MG TABS; Take 1 tablet (20 mg total) by mouth 2 (two) times daily.  Dispense: 180 tablet; Refill: 3 - olmesartan (BENICAR) 40 MG tablet; Take 1 tablet (40 mg total) by mouth daily.  Dispense: 90 tablet; Refill: 0  2. Paroxysmal atrial fibrillation EKG shows on demand atrial pacing and some ST-T changes. Patient reports no concerning symptoms (chest pain, palpitations, dizziness etc.) at present. She was asked to schedule an appointment with Dr. Candis Musa for follow-up. - EKG 12-Lead  3. Anticoagulation monitoring, INR range 2-3 Patient on chronic anticoagulation due to paroxysmal A. fib, currently in sinus rhythm. - INR/PT  There are no diagnoses linked to this encounter.  Rambo Sarafian [facility] 05/18/2015 11:09 AM

## 2015-05-19 LAB — COMPREHENSIVE METABOLIC PANEL
ALT: 20 IU/L (ref 0–32)
AST: 20 IU/L (ref 0–40)
Albumin/Globulin Ratio: 1.4 (ref 1.1–2.5)
Albumin: 3.9 g/dL (ref 3.5–4.8)
Alkaline Phosphatase: 101 IU/L (ref 39–117)
BUN/Creatinine Ratio: 11 (ref 11–26)
BUN: 15 mg/dL (ref 8–27)
Bilirubin Total: 0.4 mg/dL (ref 0.0–1.2)
CO2: 22 mmol/L (ref 18–29)
Calcium: 9.4 mg/dL (ref 8.7–10.3)
Chloride: 102 mmol/L (ref 97–108)
Creatinine, Ser: 1.35 mg/dL — ABNORMAL HIGH (ref 0.57–1.00)
GFR calc Af Amer: 44 mL/min/{1.73_m2} — ABNORMAL LOW (ref >59)
GFR calc non Af Amer: 38 mL/min/{1.73_m2} — ABNORMAL LOW (ref >59)
Globulin, Total: 2.8 g/dL (ref 1.5–4.5)
Glucose: 224 mg/dL — ABNORMAL HIGH (ref 65–99)
Potassium: 4.3 mmol/L (ref 3.5–5.2)
Sodium: 140 mmol/L (ref 134–144)
Total Protein: 6.7 g/dL (ref 6.0–8.5)

## 2015-05-19 LAB — PROTIME-INR
INR: 2.8 — ABNORMAL HIGH (ref 0.8–1.2)
Prothrombin Time: 28.9 s — ABNORMAL HIGH (ref 9.1–12.0)

## 2015-05-20 ENCOUNTER — Telehealth: Payer: Self-pay

## 2015-05-20 DIAGNOSIS — I480 Paroxysmal atrial fibrillation: Secondary | ICD-10-CM

## 2015-05-20 DIAGNOSIS — R002 Palpitations: Secondary | ICD-10-CM

## 2015-05-20 NOTE — Telephone Encounter (Signed)
Pt states she is having a rapid heartbeat, and is fatigue a lot. States she is having a "flutter" in her right ear. States her PCP did an EKG yesterday, and told her"he didn't like the way it came out". States her rapid heartbeat has been going on for 2 weeks, about the same time she has received her new CPAP machine. Please call.

## 2015-05-20 NOTE — Telephone Encounter (Signed)
Spoke w/pt. °

## 2015-05-20 NOTE — Telephone Encounter (Signed)
Spoke w/ pt.  She reports a fluttering sensation in her ear that she believes is due to elevated BP. Reports readings 160s/70-80s recently. Pt previously c/o palpitations in the early am that would wake her, but resolved after several deep breaths. Pt recently obtained a new CPAP machine and had no episodes for the first 2-3 days, but has recently started up again.  Reports palpitations wake her every am b/t 3-5 am. Advised her that Dr. Rockey Situ had previously discussed ordering a 30 day monitor if her sx increased.  She states that she is ready to proceed w/ monitor.   Advised her that I am placing order w/ [facility] and they will contact her to today to verify information.  She is appreciative and will call back w/ any further questions or concerns.

## 2015-05-25 ENCOUNTER — Encounter (INDEPENDENT_AMBULATORY_CARE_PROVIDER_SITE_OTHER): Payer: Medicare Other

## 2015-05-25 ENCOUNTER — Telehealth: Payer: Self-pay | Admitting: Family Medicine

## 2015-05-25 DIAGNOSIS — R002 Palpitations: Secondary | ICD-10-CM | POA: Diagnosis not present

## 2015-05-25 DIAGNOSIS — F419 Anxiety disorder, unspecified: Secondary | ICD-10-CM

## 2015-05-25 DIAGNOSIS — I480 Paroxysmal atrial fibrillation: Secondary | ICD-10-CM | POA: Diagnosis not present

## 2015-05-25 MED ORDER — ALPRAZOLAM 0.5 MG PO TABS: 0.5000 mg | ORAL_TABLET | Freq: Every day | ORAL | Status: DC | PRN

## 2015-05-25 NOTE — Telephone Encounter (Signed)
Prescription printed and ready for pickup. ?

## 2015-05-25 NOTE — Telephone Encounter (Signed)
Requesting refill request for Alprazolam. She was last seen on 04-26-15 and have returning appointment scheduled for 06-30-15. Please send to [facility]. Please call when complete.

## 2015-05-25 NOTE — Telephone Encounter (Signed)
Pt informed to pick up prescription

## 2015-05-26 ENCOUNTER — Telehealth: Payer: Self-pay | Admitting: Family Medicine

## 2015-05-26 NOTE — Telephone Encounter (Signed)
PT IS VERY UPSET FOR SHE AND THE PHARM HAS BEEN TRYING TO GET HER REFILL ON HER ALPRAZOLAM AND SHE WAS TOLD YESTERDAY THAT IT WAS AT THE PHARM BUT SHE WENT AND IT IS NOT THERE. SHE IS OUT AND IS VERY UPSET. PLEASE ADVISE AS WHAT TO DO.

## 2015-05-26 NOTE — Telephone Encounter (Signed)
Script put at front desk yesterday due to it being a controlled substance.

## 2015-05-27 ENCOUNTER — Other Ambulatory Visit: Payer: Self-pay

## 2015-05-27 DIAGNOSIS — F419 Anxiety disorder, unspecified: Secondary | ICD-10-CM

## 2015-05-27 MED ORDER — ALPRAZOLAM 0.5 MG PO TABS: 0.5000 mg | ORAL_TABLET | Freq: Every day | ORAL | Status: DC | PRN

## 2015-05-27 NOTE — Telephone Encounter (Signed)
Pharmacy sent Rx request. Patient thought this had already been called in but, pharmacy does not have Rx.

## 2015-06-08 ENCOUNTER — Telehealth: Payer: Self-pay | Admitting: Family Medicine

## 2015-06-08 NOTE — Telephone Encounter (Signed)
Pt is requesting status on her glucose test strips and lipitor mediation. Please send to [facility]. Please call patient once the prescription has been called in to her pharmacy. [phone]

## 2015-06-09 ENCOUNTER — Other Ambulatory Visit: Payer: Self-pay | Admitting: Family Medicine

## 2015-06-09 ENCOUNTER — Encounter: Payer: Self-pay | Admitting: Internal Medicine

## 2015-06-09 DIAGNOSIS — IMO0002 Reserved for concepts with insufficient information to code with codable children: Secondary | ICD-10-CM

## 2015-06-09 DIAGNOSIS — E1165 Type 2 diabetes mellitus with hyperglycemia: Secondary | ICD-10-CM

## 2015-06-09 DIAGNOSIS — E7800 Pure hypercholesterolemia, unspecified: Secondary | ICD-10-CM

## 2015-06-09 MED ORDER — GLUCOSE BLOOD VI STRP: ORAL_STRIP | Status: DC

## 2015-06-09 MED ORDER — ATORVASTATIN CALCIUM 20 MG PO TABS: 20.0000 mg | ORAL_TABLET | Freq: Every day | ORAL | Status: DC

## 2015-06-22 ENCOUNTER — Telehealth: Payer: Self-pay | Admitting: Family Medicine

## 2015-06-22 DIAGNOSIS — I10 Essential (primary) hypertension: Secondary | ICD-10-CM

## 2015-06-22 MED ORDER — OLMESARTAN MEDOXOMIL 40 MG PO TABS: 40.0000 mg | ORAL_TABLET | Freq: Every day | ORAL | Status: DC

## 2015-06-22 NOTE — Telephone Encounter (Signed)
[facility] from [facility] is requesting refill on Benicar 40mg .

## 2015-06-22 NOTE — Telephone Encounter (Signed)
Spoke with [facility] they are stating that this prescription for Benicar 40mg  never came over and are requesting a resend.

## 2015-06-24 ENCOUNTER — Encounter: Payer: Self-pay | Admitting: Internal Medicine

## 2015-06-28 ENCOUNTER — Other Ambulatory Visit: Payer: Self-pay | Admitting: Family Medicine

## 2015-06-28 MED ORDER — GLIPIZIDE 10 MG PO TABS: 10.0000 mg | ORAL_TABLET | Freq: Two times a day (BID) | ORAL | Status: DC

## 2015-06-28 NOTE — Telephone Encounter (Signed)
Medication has been refilled and sent to [facility] °

## 2015-06-30 ENCOUNTER — Ambulatory Visit (INDEPENDENT_AMBULATORY_CARE_PROVIDER_SITE_OTHER): Payer: Medicare Other | Admitting: Family Medicine

## 2015-06-30 ENCOUNTER — Encounter: Payer: Self-pay | Admitting: Family Medicine

## 2015-06-30 VITALS — BP 170/80 | HR 89 | Temp 97.8°F | Resp 18 | Ht 65.0 in | Wt 243.0 lb

## 2015-06-30 DIAGNOSIS — Z7901 Long term (current) use of anticoagulants: Secondary | ICD-10-CM

## 2015-06-30 DIAGNOSIS — I10 Essential (primary) hypertension: Secondary | ICD-10-CM | POA: Diagnosis not present

## 2015-06-30 LAB — POCT INR: INR: 3.1

## 2015-06-30 MED ORDER — AMLODIPINE BESYLATE 10 MG PO TABS: 10.0000 mg | ORAL_TABLET | Freq: Every day | ORAL | Status: DC

## 2015-06-30 NOTE — Progress Notes (Signed)
Name: [name] [name]   MRN: [id number]    DOB: 1935/11/21   Date:06/30/2015       Progress Note  Subjective  Chief Complaint  Chief Complaint  Patient presents with  . Follow-up    3 mo f/u  . Diabetes  . Medication Refill    Hypertension This is a chronic problem. The problem is uncontrolled. Associated symptoms include palpitations (being followed by Cardiology and had a heart monitor for 4 weeks) and peripheral edema (left ankle swelling). Pertinent negatives include no chest pain, headaches or shortness of breath. Past treatments include beta blockers, calcium channel blockers and angiotensin blockers. Hypertensive end-organ damage includes kidney disease. There is no history of angina or CVA.      Past Medical History  Diagnosis Date  . SSS (sick sinus syndrome)     a. 2008 s/p MDT PPM;  b. 11/2013 Gen change- MDT ADDRL1 Adapta DC PPM, ser # [id number] H.  Marland Kitchen PAF (paroxysmal atrial fibrillation)   . Unspecified essential hypertension   . Esophageal reflux   . Unspecified glaucoma   . Morbid obesity   . History of UTI   . Syncope and collapse     a. Felt to be vasovagal.  . Hyperlipidemia   . OSA on CPAP   . Type II diabetes mellitus   . Anemia   . H/O hiatal hernia   . Arthritis     a. shoulders.  . Anxiety   . Chronic diastolic heart failure     a. 09/2013 Echo: EF 50-55%, mild LVH, mild to mod MR/TR.  . 1St degree AV block     a. PR 400 msec with Apacing  . Symptomatic PVCs   . Chest pain     a. 2004 Cath:  reportedly nl;  b. 09/2013 Neg MV.    Past Surgical History  Procedure Laterality Date  . Vaginal hysterectomy  1970  . Incision and drainage of wound Right 03/1998    "leg; it was like a boil" (12/01/2013)  . Insert / replace / remove pacemaker  2008; 12/01/2013    MDT ADDRL1 pacemaker - gen change by Dr Caryl Comes 12/01/2013  . Dilation and curettage of uterus    . Permanent pacemaker generator change N/A 12/01/2013    Procedure: PERMANENT PACEMAKER  GENERATOR CHANGE;  Surgeon: Deboraha Sprang, MD;  Location: [facility] CATH LAB;  Service: Cardiovascular;  Laterality: N/A;  . Lung biopsy  2010    [facility]    Family History  Problem Relation Age of Onset  . Liver cancer Mother   . Pancreatic cancer Mother   . Heart disease Father   . Rheum arthritis Father   . Allergies Father     History   Social History  . Marital Status: Widowed    Spouse Name: N/A  . Number of Children: Y  . Years of Education: N/A   Occupational History  . retired    Social History Main Topics  . Smoking status: Former Smoker -- 0.50 packs/day for 34 years    Types: Cigarettes    Quit date: 08/15/1989  . Smokeless tobacco: Never Used  . Alcohol Use: No  . Drug Use: No  . Sexual Activity: No   Other Topics Concern  . Not on file   Social History Narrative   Retired. Widowed. Regularly exercises.      Current outpatient prescriptions:  .  ALPRAZolam (XANAX) 0.5 MG tablet, Take 1 tablet (0.5 mg total) by mouth daily as needed.,  Disp: 90 tablet, Rfl: 0 .  amLODipine (NORVASC) 5 MG tablet, Take 1.5 tablets (7.5 mg total) by mouth daily. 1.5 tabs po q day., Disp: 90 tablet, Rfl: 0 .  atorvastatin (LIPITOR) 20 MG tablet, Take 1 tablet (20 mg total) by mouth daily., Disp: 90 tablet, Rfl: 0 .  Blood Glucose Monitoring Suppl (ACCU-CHEK AVIVA PLUS) W/DEVICE KIT, daily. , Disp: , Rfl:  .  esomeprazole (NEXIUM) 40 MG capsule, Take 1 capsule (40 mg total) by mouth daily., Disp: 90 capsule, Rfl: 0 .  furosemide (LASIX) 20 MG tablet, Take 20 mg by mouth daily as needed for fluid., Disp: , Rfl:  .  glipiZIDE (GLUCOTROL) 10 MG tablet, Take 1 tablet (10 mg total) by mouth 2 (two) times daily before a meal., Disp: 135 tablet, Rfl: 0 .  glucose blood (ACCU-CHEK AVIVA PLUS) test strip, Test Twice daily, Disp: 200 each, Rfl: 0 .  metFORMIN (GLUCOPHAGE-XR) 500 MG 24 hr tablet, Take 1 tablet (500 mg total) by mouth 2 (two) times daily., Disp: 180 tablet, Rfl: 0 .  MICROLET  LANCETS MISC, as needed. , Disp: , Rfl:  .  mometasone (ELOCON) 0.1 % cream, Apply topically daily. Apply to ear as directed two times a day for 10 days as needed   , Disp: , Rfl:  .  Multiple Vitamins-Minerals (CENTRUM SILVER PO), Take by mouth daily., Disp: , Rfl:  .  Nebivolol HCl (BYSTOLIC) 20 MG TABS, Take 1 tablet (20 mg total) by mouth 2 (two) times daily., Disp: 180 tablet, Rfl: 3 .  olmesartan (BENICAR) 40 MG tablet, Take 1 tablet (40 mg total) by mouth daily., Disp: 90 tablet, Rfl: 0 .  Omega-3 Fatty Acids (FISH OIL) 1200 MG CAPS, Take 1,200 mg by mouth daily., Disp: , Rfl:  .  SIMBRINZA 1-0.2 % SUSP, Place 1 drop into both eyes 2 (two) times daily. , Disp: , Rfl:  .  TRAVATAN Z 0.004 % SOLN ophthalmic solution, , Disp: , Rfl:  .  travoprost, benzalkonium, (TRAVATAN) 0.004 % ophthalmic solution, Place 1 drop into both eyes at bedtime. , Disp: , Rfl:  .  warfarin (COUMADIN) 3 MG tablet, Take 3-4.5 mg by mouth daily at 6 PM. Takes 20m on Tues, Thurs, Sat and Sun. Takes 4.532mon Mon, Wed and Friday., Disp: , Rfl:   No Known Allergies   Review of Systems  Respiratory: Negative for shortness of breath.   Cardiovascular: Positive for palpitations (being followed by Cardiology and had a heart monitor for 4 weeks). Negative for chest pain.  Neurological: Negative for headaches.      Objective  Filed Vitals:   06/30/15 0912  BP: 162/75  Pulse: 89  Temp: 97.8 F (36.6 C)  TempSrc: Oral  Resp: 18  Height: '5\' 5"'  (1.651 m)  Weight: 243 lb (110.224 kg)  SpO2: 90%    Physical Exam  Constitutional: She is oriented to person, place, and time and well-developed, well-nourished, and in no distress.  HENT:  Head: Normocephalic and atraumatic.  Cardiovascular: Normal rate and regular rhythm.   Pulmonary/Chest: Effort normal and breath sounds normal.  Neurological: She is alert and oriented to person, place, and time.  Skin: Skin is warm and dry.  Psychiatric: Affect normal.   Nursing note and vitals reviewed.      Recent Results (from the past 2160 hour(s))  Hemoglobin A1c     Status: Abnormal   Collection Time: 04/13/15 12:00 AM  Result Value Ref Range   Hgb A1c MFr  Bld 8.1 (A) 4.0 - 6.0 %  Comprehensive Metabolic Panel (CMET)     Status: Abnormal   Collection Time: 05/18/15 11:28 AM  Result Value Ref Range   Glucose 224 (H) 65 - 99 mg/dL   BUN 15 8 - 27 mg/dL   Creatinine, Ser 1.35 (H) 0.57 - 1.00 mg/dL   GFR calc non Af Amer 38 (L) >59 mL/min/1.73   GFR calc Af Amer 44 (L) >59 mL/min/1.73   BUN/Creatinine Ratio 11 11 - 26   Sodium 140 134 - 144 mmol/L   Potassium 4.3 3.5 - 5.2 mmol/L   Chloride 102 97 - 108 mmol/L   CO2 22 18 - 29 mmol/L   Calcium 9.4 8.7 - 10.3 mg/dL   Total Protein 6.7 6.0 - 8.5 g/dL   Albumin 3.9 3.5 - 4.8 g/dL   Globulin, Total 2.8 1.5 - 4.5 g/dL   Albumin/Globulin Ratio 1.4 1.1 - 2.5   Bilirubin Total 0.4 0.0 - 1.2 mg/dL   Alkaline Phosphatase 101 39 - 117 IU/L   AST 20 0 - 40 IU/L   ALT 20 0 - 32 IU/L  INR/PT     Status: Abnormal   Collection Time: 05/18/15 11:28 AM  Result Value Ref Range   INR 2.8 (H) 0.8 - 1.2    Comment: Reference interval is for non-anticoagulated patients. Suggested INR therapeutic range for Vitamin K antagonist therapy:    Standard Dose (moderate intensity                   therapeutic range):       2.0 - 3.0    Higher intensity therapeutic range       2.5 - 3.5    Prothrombin Time 28.9 (H) 9.1 - 12.0 sec     Assessment & Plan 1. Essential hypertension Increase amlodipine to 10 mg daily for poorly controlled blood pressure. Patient is to keep a blood pressure log and follow-up in one month. - amLODipine (NORVASC) 10 MG tablet; Take 1 tablet (10 mg total) by mouth daily.  Dispense: 90 tablet; Refill: 0  2. Anticoagulation monitoring, INR range 2-3 Currently on Coumadin therapy for anticoagulation due to history of A. fib. Recheck PT/INR today - INR/PT  Kyasia Steuck Asad A.  [facility] 06/30/2015 9:38 AM

## 2015-07-02 ENCOUNTER — Ambulatory Visit (INDEPENDENT_AMBULATORY_CARE_PROVIDER_SITE_OTHER): Payer: Medicare Other

## 2015-07-02 DIAGNOSIS — Z7901 Long term (current) use of anticoagulants: Secondary | ICD-10-CM | POA: Diagnosis not present

## 2015-07-02 DIAGNOSIS — I480 Paroxysmal atrial fibrillation: Secondary | ICD-10-CM | POA: Diagnosis not present

## 2015-07-02 LAB — POCT INR: INR: 2.3

## 2015-07-05 ENCOUNTER — Encounter: Payer: Self-pay | Admitting: Internal Medicine

## 2015-07-05 ENCOUNTER — Telehealth: Payer: Self-pay | Admitting: Family Medicine

## 2015-07-05 DIAGNOSIS — I10 Essential (primary) hypertension: Secondary | ICD-10-CM

## 2015-07-05 MED ORDER — OLMESARTAN MEDOXOMIL 40 MG PO TABS: 40.0000 mg | ORAL_TABLET | Freq: Every day | ORAL | Status: DC

## 2015-07-05 MED ORDER — WARFARIN SODIUM 3 MG PO TABS: 3.0000 mg | ORAL_TABLET | Freq: Every day | ORAL | Status: DC

## 2015-07-05 MED ORDER — BRINZOLAMIDE-BRIMONIDINE 1-0.2 % OP SUSP: 1.0000 [drp] | Freq: Two times a day (BID) | OPHTHALMIC | Status: DC

## 2015-07-05 MED ORDER — MICROLET LANCETS MISC: Status: DC

## 2015-07-05 NOTE — Telephone Encounter (Signed)
Requesting a refill on Benicar. She only have 1 pill left.

## 2015-07-05 NOTE — Telephone Encounter (Signed)
Medication has been refilled and sent to [facility] [address] 

## 2015-07-20 ENCOUNTER — Encounter: Payer: Self-pay | Admitting: Internal Medicine

## 2015-07-20 ENCOUNTER — Ambulatory Visit (INDEPENDENT_AMBULATORY_CARE_PROVIDER_SITE_OTHER): Payer: Medicare Other | Admitting: Internal Medicine

## 2015-07-20 VITALS — BP 127/75 | HR 80 | Ht 64.0 in | Wt 242.2 lb

## 2015-07-20 DIAGNOSIS — I495 Sick sinus syndrome: Secondary | ICD-10-CM | POA: Diagnosis not present

## 2015-07-20 DIAGNOSIS — I480 Paroxysmal atrial fibrillation: Secondary | ICD-10-CM | POA: Diagnosis not present

## 2015-07-20 DIAGNOSIS — I440 Atrioventricular block, first degree: Secondary | ICD-10-CM

## 2015-07-20 DIAGNOSIS — Z950 Presence of cardiac pacemaker: Secondary | ICD-10-CM

## 2015-07-20 LAB — CUP PACEART INCLINIC DEVICE CHECK
Battery Impedance: 133 Ohm
Battery Voltage: 2.79 V
Brady Statistic AP VP Percent: 41.6 %
Brady Statistic AP VS Percent: 51.7 %
Brady Statistic AS VP Percent: 4.5 %
Brady Statistic AS VS Percent: 2.2 %
Date Time Interrogation Session: 20160816145424
Lead Channel Impedance Value: 459 Ohm
Lead Channel Impedance Value: 595 Ohm
Lead Channel Pacing Threshold Amplitude: 0.5 V
Lead Channel Pacing Threshold Amplitude: 0.75 V
Lead Channel Pacing Threshold Pulse Width: 0.4 ms
Lead Channel Pacing Threshold Pulse Width: 0.4 ms
Lead Channel Sensing Intrinsic Amplitude: 11.2 mV
Lead Channel Setting Pacing Amplitude: 2 V
Lead Channel Setting Pacing Amplitude: 2.5 V
Lead Channel Setting Pacing Pulse Width: 0.4 ms
Lead Channel Setting Sensing Sensitivity: 2.8 mV

## 2015-07-20 NOTE — Patient Instructions (Signed)
Medication Instructions: - no changes  Labwork: - none   Procedures/Testing: - none  Follow-Up: - Remote monitoring is used to monitor your Pacemaker of ICD from home. This monitoring reduces the number of office visits required to check your device to one time per year. It allows [place] to keep an eye on the functioning of your device to ensure it is working properly. You are scheduled for a device check from home on 10/19/15. You may send your transmission at any time that day. If you have a wireless device, the transmission will be sent automatically. After your physician reviews your transmission, you will receive a postcard with your next transmission date.  - Your physician wants you to follow-up in: 1 year with Dr. Caryl Comes. You will receive a reminder letter in the mail two months in advance. If you don't receive a letter, please call our office to schedule the follow-up appointment.  Any Additional Special Instructions Will Be Listed Below (If Applicable). - none

## 2015-07-20 NOTE — Progress Notes (Signed)
Patient Care Team: Roselee Nova, MD as PCP - General (Family Medicine)   HPI  [name] [name] is a 80 y.o. female Seen in followup for pacemaker implanted for sinus node dysfunction and paroxysmal fibrillation. She underwent device generator replacement 12/14. She has a history of syncope thought to be vasovagal  When seen in august 2015 c/o nocturnal  palpitations which are quite disruptive and after which she is unable to return to sleep. They typically persists about 5-10 minutes after she awakens   She was  Complaining a few months ago of palpitations awakening her at night and feeling in her ear. She is given a 30 day event recorder. This was reviewed. See below.   NO evidence of HF or ischemia at that time  Amlodipine was added;  She continues with chronic edema  Not worsened   Has a remote history of catheterization stress testing which as best as I can discern was unilluminating       Past Medical History  Diagnosis Date  . SSS (sick sinus syndrome)     a. 2008 s/p MDT PPM;  b. 11/2013 Gen change- MDT ADDRL1 Adapta DC PPM, ser # [id number] H.  Marland Kitchen PAF (paroxysmal atrial fibrillation)   . Unspecified essential hypertension   . Esophageal reflux   . Unspecified glaucoma   . Morbid obesity   . History of UTI   . Syncope and collapse     a. Felt to be vasovagal.  . Hyperlipidemia   . OSA on CPAP   . Type II diabetes mellitus   . Anemia   . H/O hiatal hernia   . Arthritis     a. shoulders.  . Anxiety   . Chronic diastolic heart failure     a. 09/2013 Echo: EF 50-55%, mild LVH, mild to mod MR/TR.  . 1St degree AV block     a. PR 400 msec with Apacing  . Symptomatic PVCs   . Chest pain     a. 2004 Cath:  reportedly nl;  b. 09/2013 Neg MV.    Past Surgical History  Procedure Laterality Date  . Vaginal hysterectomy  1970  . Incision and drainage of wound Right 03/1998    "leg; it was like a boil" (12/01/2013)  . Insert / replace / remove pacemaker  2008;  12/01/2013    MDT ADDRL1 pacemaker - gen change by Dr Caryl Comes 12/01/2013  . Dilation and curettage of uterus    . Permanent pacemaker generator change N/A 12/01/2013    Procedure: PERMANENT PACEMAKER GENERATOR CHANGE;  Surgeon: Deboraha Sprang, MD;  Location: [facility] CATH LAB;  Service: Cardiovascular;  Laterality: N/A;  . Lung biopsy  2010    [facility]    Current Outpatient Prescriptions  Medication Sig Dispense Refill  . ALPRAZolam (XANAX) 0.5 MG tablet Take 1 tablet (0.5 mg total) by mouth daily as needed. 90 tablet 0  . amLODipine (NORVASC) 10 MG tablet Take 1 tablet (10 mg total) by mouth daily. 90 tablet 0  . atorvastatin (LIPITOR) 20 MG tablet Take 1 tablet (20 mg total) by mouth daily. 90 tablet 0  . Blood Glucose Monitoring Suppl (ACCU-CHEK AVIVA PLUS) W/DEVICE KIT daily.     . Brinzolamide-Brimonidine (SIMBRINZA) 1-0.2 % SUSP Place 1 drop into both eyes 2 (two) times daily. 1 Bottle 1  . esomeprazole (NEXIUM) 40 MG capsule Take 1 capsule (40 mg total) by mouth daily. 90 capsule 0  . furosemide (LASIX) 20 MG  tablet Take 20 mg by mouth daily as needed for fluid.    Marland Kitchen glipiZIDE (GLUCOTROL) 10 MG tablet Take 1 tablet (10 mg total) by mouth 2 (two) times daily before a meal. 135 tablet 0  . glucose blood (ACCU-CHEK AVIVA PLUS) test strip Test Twice daily 200 each 0  . metFORMIN (GLUCOPHAGE-XR) 500 MG 24 hr tablet Take 1 tablet (500 mg total) by mouth 2 (two) times daily. 180 tablet 0  . MICROLET LANCETS MISC Use as directed to check BG QD 60 each 2  . mometasone (ELOCON) 0.1 % cream Apply topically daily. Apply to ear as directed two times a day for 10 days as needed       . Multiple Vitamins-Minerals (CENTRUM SILVER PO) Take by mouth daily.    . Nebivolol HCl (BYSTOLIC) 20 MG TABS Take 1 tablet (20 mg total) by mouth 2 (two) times daily. 180 tablet 3  . olmesartan (BENICAR) 40 MG tablet Take 1 tablet (40 mg total) by mouth daily. 90 tablet 0  . Omega-3 Fatty Acids (FISH OIL) 1200 MG CAPS Take  1,200 mg by mouth daily.    . TRAVATAN Z 0.004 % SOLN ophthalmic solution     . travoprost, benzalkonium, (TRAVATAN) 0.004 % ophthalmic solution Place 1 drop into both eyes at bedtime.     Marland Kitchen warfarin (COUMADIN) 3 MG tablet Take 1-1.5 tablets (3-4.5 mg total) by mouth daily at 6 PM. Takes 106m on Tues, Thurs, Sat and Sun. Takes 4.563mon Mon, Wed and Friday. 100 tablet 3   No current facility-administered medications for this visit.    No Known Allergies  Review of Systems negative except from HPI and PMH  Physical Exam BP 127/75 mmHg  Pulse 80  Ht _0  (1.626 m)  Wt 242 lb 4 oz (109.884 kg)  BMI 41.56 kg/m2 Well developed and morbidly obese in no acute distress HENT normal E scleral and icterus clear Neck Supple JVP 7-8  carotids brisk and full Clear to ausculation Device pocket well healed; without hematoma or erythema.  There is no tethering  regular rate and rhythm, no murmurs gallops or rub Soft with active bowel sounds No clubbing cyanosis  Left 1+ right traceEdema Alert and oriented, grossly normal motor and sensory function Skin Warm and Dry   Event recorder demonstrated intermittent wide complex beats some irregularity suggests atrial fibrillation as well as PACs.  ECG demonstrates AV pacing at 80  Assessment and  Plan  Sinus node dysfunction  First degree AV block  Atrial fibrillation-paroxysmal  hypertension  HFpEF-chronic  Nocturnal palpitations  Syncope-neurally mediated  Sinusitis  Pacemaker-Medtronic The patient's device was interrogated.  The information was reviewed. The device was reprogrammed to the AV delay 150/130--200/180. She is now about 40% ventricularly paced.  We also decreased resting rate from 75--70.  Nocturnal palpitations are as noted above. PVCs as well as PACs and some ventricular pacing.  We will decrease her resting rate from 75--70 and now on to 60. We'll also decrease her ADL response factor by 1.  She has intermittent  atrial fibrillation;  she remains on warfarin Blood pressure is well-controlled  No intercurrent syncope.  She appears to be  euvolemic

## 2015-07-21 ENCOUNTER — Ambulatory Visit (INDEPENDENT_AMBULATORY_CARE_PROVIDER_SITE_OTHER): Payer: Medicare Other | Admitting: Family Medicine

## 2015-07-21 ENCOUNTER — Encounter: Payer: Self-pay | Admitting: Family Medicine

## 2015-07-21 VITALS — HR 70 | Temp 98.0°F | Resp 18 | Ht 65.0 in | Wt 243.7 lb

## 2015-07-21 DIAGNOSIS — M10071 Idiopathic gout, right ankle and foot: Secondary | ICD-10-CM | POA: Diagnosis not present

## 2015-07-21 DIAGNOSIS — M109 Gout, unspecified: Secondary | ICD-10-CM

## 2015-07-21 MED ORDER — COLCHICINE 0.6 MG PO TABS: ORAL_TABLET | ORAL | Status: DC

## 2015-07-21 NOTE — Progress Notes (Signed)
Name: [name] [name]   MRN: [id number]    DOB: [birth year]   Date:07/21/2015       Progress Note  Subjective  Chief Complaint  Chief Complaint  Patient presents with  . Acute Visit    Possible Gout    HPI  Gout Attack Started two days ago, sudden onset pain and swelling in her right lateral side of foot. Pt. Has hx of gout and last gout attack was in December 2015. Her diet has not changed.  Past Medical History  Diagnosis Date  . SSS (sick sinus syndrome)     a. 2008 s/p MDT PPM;  b. 11/2013 Gen change- MDT ADDRL1 Adapta DC PPM, ser # [id number] H.  Marland Kitchen PAF (paroxysmal atrial fibrillation)   . Unspecified essential hypertension   . Esophageal reflux   . Unspecified glaucoma   . Morbid obesity   . History of UTI   . Syncope and collapse     a. Felt to be vasovagal.  . Hyperlipidemia   . OSA on CPAP   . Type II diabetes mellitus   . Anemia   . H/O hiatal hernia   . Arthritis     a. shoulders.  . Anxiety   . Chronic diastolic heart failure     a. 09/2013 Echo: EF 50-55%, mild LVH, mild to mod MR/TR.  . 1St degree AV block     a. PR 400 msec with Apacing  . Symptomatic PVCs   . Chest pain     a. 2004 Cath:  reportedly nl;  b. 09/2013 Neg MV.    Past Surgical History  Procedure Laterality Date  . Vaginal hysterectomy  1970  . Incision and drainage of wound Right 03/1998    "leg; it was like a boil" (12/01/2013)  . Insert / replace / remove pacemaker  2008; 12/01/2013    MDT ADDRL1 pacemaker - gen change by Dr Caryl Comes 12/01/2013  . Dilation and curettage of uterus    . Permanent pacemaker generator change N/A 12/01/2013    Procedure: PERMANENT PACEMAKER GENERATOR CHANGE;  Surgeon: Deboraha Sprang, MD;  Location: [facility] CATH LAB;  Service: Cardiovascular;  Laterality: N/A;  . Lung biopsy  2010    [facility]    Family History  Problem Relation Age of Onset  . Liver cancer Mother   . Pancreatic cancer Mother   . Heart disease Father   . Rheum arthritis Father   . Allergies Father      Social History   Social History  . Marital Status: Widowed    Spouse Name: N/A  . Number of Children: Y  . Years of Education: N/A   Occupational History  . retired    Social History Main Topics  . Smoking status: Former Smoker -- 0.50 packs/day for 34 years    Types: Cigarettes    Quit date: 08/15/1989  . Smokeless tobacco: Never Used  . Alcohol Use: No  . Drug Use: No  . Sexual Activity: No   Other Topics Concern  . Not on file   Social History Narrative   Retired. Widowed. Regularly exercises.      Current outpatient prescriptions:  .  ALPRAZolam (XANAX) 0.5 MG tablet, Take 1 tablet (0.5 mg total) by mouth daily as needed., Disp: 90 tablet, Rfl: 0 .  amLODipine (NORVASC) 10 MG tablet, Take 1 tablet (10 mg total) by mouth daily., Disp: 90 tablet, Rfl: 0 .  atorvastatin (LIPITOR) 20 MG tablet, Take 1 tablet (20 mg total) by  mouth daily., Disp: 90 tablet, Rfl: 0 .  Blood Glucose Monitoring Suppl (ACCU-CHEK AVIVA PLUS) W/DEVICE KIT, daily. , Disp: , Rfl:  .  Brinzolamide-Brimonidine (SIMBRINZA) 1-0.2 % SUSP, Place 1 drop into both eyes 2 (two) times daily., Disp: 1 Bottle, Rfl: 1 .  esomeprazole (NEXIUM) 40 MG capsule, Take 1 capsule (40 mg total) by mouth daily., Disp: 90 capsule, Rfl: 0 .  furosemide (LASIX) 20 MG tablet, Take 20 mg by mouth daily as needed for fluid., Disp: , Rfl:  .  glipiZIDE (GLUCOTROL) 10 MG tablet, Take 1 tablet (10 mg total) by mouth 2 (two) times daily before a meal., Disp: 135 tablet, Rfl: 0 .  glucose blood (ACCU-CHEK AVIVA PLUS) test strip, Test Twice daily, Disp: 200 each, Rfl: 0 .  metFORMIN (GLUCOPHAGE-XR) 500 MG 24 hr tablet, Take 1 tablet (500 mg total) by mouth 2 (two) times daily., Disp: 180 tablet, Rfl: 0 .  MICROLET LANCETS MISC, Use as directed to check BG QD, Disp: 60 each, Rfl: 2 .  mometasone (ELOCON) 0.1 % cream, Apply topically daily. Apply to ear as directed two times a day for 10 days as needed   , Disp: , Rfl:  .  Multiple  Vitamins-Minerals (CENTRUM SILVER PO), Take by mouth daily., Disp: , Rfl:  .  Nebivolol HCl (BYSTOLIC) 20 MG TABS, Take 1 tablet (20 mg total) by mouth 2 (two) times daily., Disp: 180 tablet, Rfl: 3 .  olmesartan (BENICAR) 40 MG tablet, Take 1 tablet (40 mg total) by mouth daily., Disp: 90 tablet, Rfl: 0 .  Omega-3 Fatty Acids (FISH OIL) 1200 MG CAPS, Take 1,200 mg by mouth daily., Disp: , Rfl:  .  TRAVATAN Z 0.004 % SOLN ophthalmic solution, , Disp: , Rfl:  .  travoprost, benzalkonium, (TRAVATAN) 0.004 % ophthalmic solution, Place 1 drop into both eyes at bedtime. , Disp: , Rfl:  .  warfarin (COUMADIN) 3 MG tablet, Take 1-1.5 tablets (3-4.5 mg total) by mouth daily at 6 PM. Takes 3m on Tues, Thurs, Sat and Sun. Takes 4.566mon Mon, Wed and Friday., Disp: 100 tablet, Rfl: 3  No Known Allergies   Review of Systems  Constitutional: Negative for fever and chills.  Musculoskeletal: Positive for joint pain. Negative for falls.  Skin: Negative for rash.      Objective  Filed Vitals:   07/21/15 0957  Pulse: 70  Temp: 98 F (36.7 C)  TempSrc: Oral  Resp: 18  Height: 5' 5" (1.651 m)  Weight: 243 lb 11.2 oz (110.542 kg)  SpO2: 98%    Physical Exam  Musculoskeletal:       Right foot: There is tenderness, bony tenderness and swelling. There is no deformity.  Nursing note and vitals reviewed.   Assessment & Plan  1. Acute gout of right foot, unspecified cause Symptoms consistent with a gout attack. We will obtain uric acid levels. Patient will be started on Colchicine and was advised to RTC if symptoms do not resolve within 48 hours. - Uric acid - colchicine 0.6 MG tablet; 1.2 mg po X 1, then 0.6 mg po1 hr later x 1.  Dispense: 3 tablet; Refill: 1   Phuong Moffatt Asad A. [facility] 07/21/2015 10:09 AM

## 2015-07-22 LAB — URIC ACID: Uric Acid: 7.3 mg/dL — ABNORMAL HIGH (ref 2.5–7.1)

## 2015-07-27 ENCOUNTER — Encounter: Payer: Self-pay | Admitting: Internal Medicine

## 2015-07-28 ENCOUNTER — Telehealth: Payer: Self-pay | Admitting: Family Medicine

## 2015-07-28 NOTE — Telephone Encounter (Signed)
Pt is requesting a return call. States that you called her the other day but she is just getting around to returning your call

## 2015-07-30 NOTE — Telephone Encounter (Signed)
Spoke with patient and she states that she is still experiencing a little pain in the foot also in the next toe now but she wants to wait and see if it gets any better before she starts any more medication.

## 2015-08-02 ENCOUNTER — Ambulatory Visit (INDEPENDENT_AMBULATORY_CARE_PROVIDER_SITE_OTHER): Payer: Medicare Other

## 2015-08-02 DIAGNOSIS — Z7901 Long term (current) use of anticoagulants: Secondary | ICD-10-CM | POA: Diagnosis not present

## 2015-08-02 DIAGNOSIS — I10 Essential (primary) hypertension: Secondary | ICD-10-CM

## 2015-08-02 LAB — POCT INR: INR: 2.4

## 2015-08-02 NOTE — Patient Instructions (Signed)
Patient is to return in 1 month

## 2015-08-04 ENCOUNTER — Telehealth: Payer: Self-pay | Admitting: Family Medicine

## 2015-08-04 DIAGNOSIS — K219 Gastro-esophageal reflux disease without esophagitis: Secondary | ICD-10-CM

## 2015-08-04 MED ORDER — ESOMEPRAZOLE MAGNESIUM 40 MG PO CPDR: 40.0000 mg | DELAYED_RELEASE_CAPSULE | Freq: Every day | ORAL | Status: DC

## 2015-08-04 NOTE — Telephone Encounter (Signed)
Medication has been refilled and sent to [facility] [address]

## 2015-08-10 ENCOUNTER — Ambulatory Visit (INDEPENDENT_AMBULATORY_CARE_PROVIDER_SITE_OTHER): Payer: Medicare Other | Admitting: Pulmonary Disease

## 2015-08-10 ENCOUNTER — Encounter: Payer: Self-pay | Admitting: Pulmonary Disease

## 2015-08-10 VITALS — BP 102/78 | HR 73 | Temp 97.8°F | Ht 65.0 in | Wt 245.8 lb

## 2015-08-10 DIAGNOSIS — Z6841 Body Mass Index (BMI) 40.0 and over, adult: Secondary | ICD-10-CM

## 2015-08-10 DIAGNOSIS — G4733 Obstructive sleep apnea (adult) (pediatric): Secondary | ICD-10-CM

## 2015-08-10 DIAGNOSIS — Z23 Encounter for immunization: Secondary | ICD-10-CM

## 2015-08-10 NOTE — Patient Instructions (Signed)
Follow up in 1 year.

## 2015-08-10 NOTE — Progress Notes (Signed)
Chief Complaint  Patient presents with  . Sleep Apnea    former [facility] patient; Wears CPAP every night; renew supplies; flu shot     History of Present Illness: [name] [name] is a 80 y.o. female with OSA and RLS.  She was previously followed by Dr. Gwenette Greet.  She had requip stopped at last visit.  Requip made her nauseous.  She has noticed that if she wears socks in bed, then she does not get leg symptoms.  She is doing well with CPAP.  She got a new machine last year.  She has full face mask.  She can't sleep w/o using CPAP.  TESTS: PSG 10/24/13 >> AHI 7 Auto CPAP 05/10/15 to 06/08/15 >> used on 29 of 30 nights with average 6 hrs and 41 min.  Average AHI is 1.7 with median CPAP 8 cm H2O and 95 th percentile CPAP 12 cm H20.   PMhx >> Paroxymal atrial fibrillation, SSS s/p PM, HTN, Diastolic CHF, HLD, DM, HH, GERD, Glaucoma  Past surgical hx, Medications, Allergies, Family hx, Social hx all reviewed.   Physical Exam: BP 102/78 mmHg  Pulse 73  Temp(Src) 97.8 F (36.6 C) (Oral)  Ht 5\' 5"  (1.651 m)  Wt 245 lb 12.8 oz (111.494 kg)  BMI 40.90 kg/m2  SpO2 98%  General - No distress ENT - No sinus tenderness, no oral exudate, no LAN, MP 3 Cardiac - s1s2 regular, no murmur Chest - No wheeze/rales/dullness Back - No focal tenderness Abd - Soft, non-tender Ext - No edema Neuro - Normal strength Skin - No rashes Psych - normal mood, and behavior   Assessment/Plan:  Obstructive sleep apnea. She is compliant with CPAP and reports benefit. Plan: - continue auto CPAP  Obesity. Plan: - discussed importance of weight loss  Restless leg syndrome. Not much of an issue at present. Plan: - monitor clinically of medications   Chesley Mires, MD [facility] Pager:  [phone]

## 2015-08-13 ENCOUNTER — Encounter: Payer: Self-pay | Admitting: Family Medicine

## 2015-08-13 ENCOUNTER — Ambulatory Visit (INDEPENDENT_AMBULATORY_CARE_PROVIDER_SITE_OTHER): Payer: Medicare Other | Admitting: Family Medicine

## 2015-08-13 ENCOUNTER — Telehealth: Payer: Self-pay | Admitting: Family Medicine

## 2015-08-13 ENCOUNTER — Ambulatory Visit: Payer: Medicare Other | Admitting: Cardiovascular Disease

## 2015-08-13 VITALS — BP 122/82 | HR 78 | Temp 98.5°F | Resp 16 | Ht 65.0 in | Wt 242.5 lb

## 2015-08-13 DIAGNOSIS — M1000 Idiopathic gout, unspecified site: Secondary | ICD-10-CM | POA: Diagnosis not present

## 2015-08-13 DIAGNOSIS — M10071 Idiopathic gout, right ankle and foot: Secondary | ICD-10-CM

## 2015-08-13 DIAGNOSIS — IMO0002 Reserved for concepts with insufficient information to code with codable children: Secondary | ICD-10-CM

## 2015-08-13 DIAGNOSIS — E1165 Type 2 diabetes mellitus with hyperglycemia: Secondary | ICD-10-CM

## 2015-08-13 DIAGNOSIS — M109 Gout, unspecified: Secondary | ICD-10-CM

## 2015-08-13 MED ORDER — COLCHICINE 0.6 MG PO TABS: 0.6000 mg | ORAL_TABLET | Freq: Two times a day (BID) | ORAL | Status: DC

## 2015-08-13 MED ORDER — METFORMIN HCL ER 500 MG PO TB24: 500.0000 mg | ORAL_TABLET | Freq: Two times a day (BID) | ORAL | Status: DC

## 2015-08-13 NOTE — Patient Instructions (Signed)

## 2015-08-13 NOTE — Progress Notes (Signed)
Name: [name] [name]   MRN: [id number]    DOB: February 06, 1935   Date:08/13/2015       Progress Note  Subjective  Chief Complaint  Chief Complaint  Patient presents with  . Gout    flare up in rt foot and lft big toe off and on for 1 month    HPI  Gout  Patient had a flareup of gout over the last 1 month. It began in her right lateral malleolar area and spread throughout the right ankle. She's also had similar episode in the left great toe. She's been on colchicine but with modest improvement only. Her last episode before this episode approximately 9 months ago. She is not currently on uricosuric or uric acid lowering medication. This been no recent fever chills myalgias or other significant joint discomfort weight loss night sweats. There's been no fever or chills. She admits to episodes often precipitated by consuming shrimp.       Past Medical History  Diagnosis Date  . SSS (sick sinus syndrome)     a. 2008 s/p MDT PPM;  b. 11/2013 Gen change- MDT ADDRL1 Adapta DC PPM, ser # [id number] H.  Marland Kitchen PAF (paroxysmal atrial fibrillation)   . Unspecified essential hypertension   . Esophageal reflux   . Unspecified glaucoma   . Morbid obesity   . History of UTI   . Syncope and collapse     a. Felt to be vasovagal.  . Hyperlipidemia   . OSA on CPAP   . Type II diabetes mellitus   . Anemia   . H/O hiatal hernia   . Arthritis     a. shoulders.  . Anxiety   . Chronic diastolic heart failure     a. 09/2013 Echo: EF 50-55%, mild LVH, mild to mod MR/TR.  . 1St degree AV block     a. PR 400 msec with Apacing  . Symptomatic PVCs   . Chest pain     a. 2004 Cath:  reportedly nl;  b. 09/2013 Neg MV.    Social History  Substance Use Topics  . Smoking status: Former Smoker -- 0.50 packs/day for 34 years    Types: Cigarettes    Quit date: 08/15/1989  . Smokeless tobacco: Never Used  . Alcohol Use: No     Current outpatient prescriptions:  .  ALPRAZolam (XANAX) 0.5 MG tablet, Take 1  tablet (0.5 mg total) by mouth daily as needed., Disp: 90 tablet, Rfl: 0 .  amLODipine (NORVASC) 10 MG tablet, Take 1 tablet (10 mg total) by mouth daily., Disp: 90 tablet, Rfl: 0 .  atorvastatin (LIPITOR) 20 MG tablet, Take 1 tablet (20 mg total) by mouth daily., Disp: 90 tablet, Rfl: 0 .  Blood Glucose Monitoring Suppl (ACCU-CHEK AVIVA PLUS) W/DEVICE KIT, daily. , Disp: , Rfl:  .  Brinzolamide-Brimonidine (SIMBRINZA) 1-0.2 % SUSP, Place 1 drop into both eyes 2 (two) times daily., Disp: 1 Bottle, Rfl: 1 .  colchicine 0.6 MG tablet, 1.2 mg po X 1, then 0.6 mg po1 hr later x 1., Disp: 3 tablet, Rfl: 1 .  esomeprazole (NEXIUM) 40 MG capsule, Take 1 capsule (40 mg total) by mouth daily., Disp: 90 capsule, Rfl: 0 .  furosemide (LASIX) 20 MG tablet, Take 20 mg by mouth daily as needed for fluid., Disp: , Rfl:  .  glipiZIDE (GLUCOTROL) 10 MG tablet, Take 1 tablet (10 mg total) by mouth 2 (two) times daily before a meal., Disp: 135 tablet, Rfl: 0 .  glucose blood (ACCU-CHEK AVIVA PLUS) test strip, Test Twice daily, Disp: 200 each, Rfl: 0 .  metFORMIN (GLUCOPHAGE-XR) 500 MG 24 hr tablet, Take 1 tablet (500 mg total) by mouth 2 (two) times daily., Disp: 180 tablet, Rfl: 0 .  MICROLET LANCETS MISC, Use as directed to check BG QD, Disp: 60 each, Rfl: 2 .  mometasone (ELOCON) 0.1 % cream, Apply topically daily. Apply to ear as directed two times a day for 10 days as needed   , Disp: , Rfl:  .  Multiple Vitamins-Minerals (CENTRUM SILVER PO), Take by mouth daily., Disp: , Rfl:  .  Nebivolol HCl (BYSTOLIC) 20 MG TABS, Take 1 tablet (20 mg total) by mouth 2 (two) times daily., Disp: 180 tablet, Rfl: 3 .  olmesartan (BENICAR) 40 MG tablet, Take 1 tablet (40 mg total) by mouth daily., Disp: 90 tablet, Rfl: 0 .  Omega-3 Fatty Acids (FISH OIL) 1200 MG CAPS, Take 1,200 mg by mouth daily., Disp: , Rfl:  .  TRAVATAN Z 0.004 % SOLN ophthalmic solution, , Disp: , Rfl:  .  travoprost, benzalkonium, (TRAVATAN) 0.004 %  ophthalmic solution, Place 1 drop into both eyes at bedtime. , Disp: , Rfl:  .  warfarin (COUMADIN) 3 MG tablet, Take 1-1.5 tablets (3-4.5 mg total) by mouth daily at 6 PM. Takes 39m on Tues, Thurs, Sat and Sun. Takes 4.537mon Mon, Wed and Friday., Disp: 100 tablet, Rfl: 3  No Known Allergies  Review of Systems  Unable to perform ROS Constitutional: Negative for fever and chills.  Musculoskeletal: Positive for joint pain (some mild warmth and synovial swelling around the right medial and lateral malleolus to less extent the left first MTP joint no significant warmth. There is tenderness to palpation.).     Objective  Filed Vitals:   08/13/15 0912  BP: 122/82  Pulse: 78  Temp: 98.5 F (36.9 C)  Resp: 16  Height: '5\' 5"'  (1.651 m)  Weight: 242 lb 8 oz (109.997 kg)  SpO2: 95%     Physical Exam  Constitutional:  Obesity no acute distress  HENT:  Head: Normocephalic.  Musculoskeletal: She exhibits tenderness (there is synovitis and tenderness about the right medial lateral malleolus with minimal erythema and warmth. There is also some mild synovitis and erythema of the left great toe. This is at the MTP joint.).  Skin: Skin is warm and dry.      Assessment & Plan  1. Acute idiopathic gout, unspecified site  - colchicine 0.6 MG tablet; Take 1 tablet (0.6 mg total) by mouth 2 (two) times daily. 1.2 mg po X 1, then 0.6 mg po1 hr later x 1.  Dispense: 14 tablet; Refill: 2 - Uric acid - Sed Rate (ESR)  2. Acute gout of right foot, unspecified cause Continue to consider adding uricosuric agent on return visit with Dr. ShManuella Ghazin one month - colchicine 0.6 MG tablet; Take 1 tablet (0.6 mg total) by mouth 2 (two) times daily. 1.2 mg po X 1, then 0.6 mg po1 hr later x 1.  Dispense: 14 tablet; Refill: 2 - Uric acid - Sed Rate (ESR)

## 2015-08-13 NOTE — Telephone Encounter (Signed)
Medication has been refilled and sent to pharmacy  

## 2015-08-14 LAB — SEDIMENTATION RATE: Sed Rate: 27 mm/hr (ref 0–40)

## 2015-08-14 LAB — URIC ACID: Uric Acid: 8 mg/dL — ABNORMAL HIGH (ref 2.5–7.1)

## 2015-09-02 ENCOUNTER — Ambulatory Visit: Payer: Medicare Other

## 2015-09-06 ENCOUNTER — Encounter: Payer: Self-pay | Admitting: Podiatry

## 2015-09-06 ENCOUNTER — Ambulatory Visit (INDEPENDENT_AMBULATORY_CARE_PROVIDER_SITE_OTHER): Payer: Medicare Other | Admitting: Podiatry

## 2015-09-06 DIAGNOSIS — E119 Type 2 diabetes mellitus without complications: Secondary | ICD-10-CM

## 2015-09-06 DIAGNOSIS — B351 Tinea unguium: Secondary | ICD-10-CM

## 2015-09-06 DIAGNOSIS — Q828 Other specified congenital malformations of skin: Secondary | ICD-10-CM

## 2015-09-06 DIAGNOSIS — M79676 Pain in unspecified toe(s): Secondary | ICD-10-CM

## 2015-09-06 NOTE — Progress Notes (Signed)
She presents today with chief complaint of elongated toenails and calluses bilateral. She states they bother me when I walk in a like to have them trimmed.  Objective: Vital signs are stable she is alert and oriented 3. Pulses are strongly palpable. Her toenails are thick yellow dystrophic with mycotic and painful palpation. Porokeratosis are also noted plantar aspect of the bilateral foot particularly the forefoot bilateral.  Assessment: Pain and limp secondary to onychomycosis and porokeratosis bilateral.  Plan: Debridement of nails and porokeratosis secondary to pain. Debridement of nails secondary to onychomycosis. Follow up with her as needed.

## 2015-09-09 ENCOUNTER — Ambulatory Visit: Payer: Medicare Other | Admitting: Cardiovascular Disease

## 2015-09-09 DIAGNOSIS — R600 Localized edema: Secondary | ICD-10-CM | POA: Diagnosis not present

## 2015-09-09 DIAGNOSIS — I10 Essential (primary) hypertension: Secondary | ICD-10-CM | POA: Diagnosis not present

## 2015-09-09 DIAGNOSIS — N183 Chronic kidney disease, stage 3 (moderate): Secondary | ICD-10-CM | POA: Diagnosis not present

## 2015-09-09 DIAGNOSIS — G4733 Obstructive sleep apnea (adult) (pediatric): Secondary | ICD-10-CM | POA: Diagnosis not present

## 2015-09-09 DIAGNOSIS — D631 Anemia in chronic kidney disease: Secondary | ICD-10-CM | POA: Diagnosis not present

## 2015-09-10 ENCOUNTER — Telehealth: Payer: Self-pay | Admitting: Family Medicine

## 2015-09-10 DIAGNOSIS — D631 Anemia in chronic kidney disease: Secondary | ICD-10-CM | POA: Diagnosis not present

## 2015-09-10 DIAGNOSIS — I10 Essential (primary) hypertension: Secondary | ICD-10-CM | POA: Diagnosis not present

## 2015-09-10 DIAGNOSIS — R600 Localized edema: Secondary | ICD-10-CM | POA: Diagnosis not present

## 2015-09-10 DIAGNOSIS — N183 Chronic kidney disease, stage 3 (moderate): Secondary | ICD-10-CM | POA: Diagnosis not present

## 2015-09-10 MED ORDER — ATORVASTATIN CALCIUM 20 MG PO TABS: 20.0000 mg | ORAL_TABLET | Freq: Every day | ORAL | Status: DC

## 2015-09-10 NOTE — Telephone Encounter (Signed)
Medication has been refilled and sent to [facility] °

## 2015-09-14 ENCOUNTER — Encounter: Payer: Self-pay | Admitting: Family Medicine

## 2015-09-14 ENCOUNTER — Ambulatory Visit (INDEPENDENT_AMBULATORY_CARE_PROVIDER_SITE_OTHER): Payer: Medicare Other | Admitting: Family Medicine

## 2015-09-14 VITALS — BP 120/76 | HR 71 | Temp 97.2°F | Resp 18 | Ht 65.0 in | Wt 246.3 lb

## 2015-09-14 DIAGNOSIS — J302 Other seasonal allergic rhinitis: Secondary | ICD-10-CM | POA: Diagnosis not present

## 2015-09-14 DIAGNOSIS — Z7901 Long term (current) use of anticoagulants: Secondary | ICD-10-CM | POA: Diagnosis not present

## 2015-09-14 DIAGNOSIS — E119 Type 2 diabetes mellitus without complications: Secondary | ICD-10-CM

## 2015-09-14 DIAGNOSIS — I10 Essential (primary) hypertension: Secondary | ICD-10-CM | POA: Diagnosis not present

## 2015-09-14 DIAGNOSIS — J309 Allergic rhinitis, unspecified: Secondary | ICD-10-CM | POA: Insufficient documentation

## 2015-09-14 DIAGNOSIS — E1169 Type 2 diabetes mellitus with other specified complication: Secondary | ICD-10-CM | POA: Diagnosis not present

## 2015-09-14 DIAGNOSIS — E785 Hyperlipidemia, unspecified: Secondary | ICD-10-CM

## 2015-09-14 DIAGNOSIS — I480 Paroxysmal atrial fibrillation: Secondary | ICD-10-CM | POA: Diagnosis not present

## 2015-09-14 LAB — GLUCOSE, POCT (MANUAL RESULT ENTRY): POC Glucose: 173 mg/dl — AB (ref 70–99)

## 2015-09-14 LAB — POCT GLYCOSYLATED HEMOGLOBIN (HGB A1C): Hemoglobin A1C: 7.6

## 2015-09-14 LAB — POCT INR: INR: 2.5

## 2015-09-14 MED ORDER — FLUTICASONE PROPIONATE 50 MCG/ACT NA SUSP: 2.0000 | Freq: Every day | NASAL | Status: DC

## 2015-09-14 NOTE — Progress Notes (Signed)
Name: [name] [name]   MRN: [id number]    DOB: 1935-09-04   Date:09/14/2015       Progress Note  Subjective  Chief Complaint  Chief Complaint  Patient presents with  . Follow-up    1 mo Gout  . Gout  . Diabetes    Diabetes She presents for her follow-up diabetic visit. She has type 2 diabetes mellitus. Her disease course has been stable. Pertinent negatives for hypoglycemia include no headaches. Associated symptoms include fatigue and polyuria (pt. on diuretic therapy.). Pertinent negatives for diabetes include no blurred vision, no chest pain and no polydipsia. Symptoms are stable. Pertinent negatives for diabetic complications include no CVA. Current diabetic treatment includes oral agent (dual therapy). She is following a diabetic diet. Her breakfast blood glucose range is generally 110-130 mg/dl. An ACE inhibitor/angiotensin II receptor blocker is being taken. Eye exam is current.  Sinus Problem This is a new problem. The current episode started in the past 7 days. There has been no fever (Temp of 99-100F). Associated symptoms include chills, coughing, a hoarse voice and sneezing. Pertinent negatives include no congestion, ear pain, headaches, shortness of breath, sinus pressure or sore throat. Past treatments include oral decongestants (Has tried Tussin for Diabetics and ES Tylenol). The treatment provided mild relief.  Hypertension This is a chronic problem. The problem is controlled. Associated symptoms include malaise/fatigue. Pertinent negatives include no blurred vision, chest pain, headaches, palpitations or shortness of breath. Past treatments include beta blockers, calcium channel blockers and angiotensin blockers. Hypertensive end-organ damage includes kidney disease. There is no history of CAD/MI or CVA. Identifiable causes of hypertension include chronic renal disease.  Hyperlipidemia This is a chronic problem. The problem is controlled. Exacerbating diseases include chronic renal  disease and diabetes. Pertinent negatives include no chest pain, leg pain, myalgias or shortness of breath. Current antihyperlipidemic treatment includes statins.  Anticoagulation Management Pt. Is on long-term anti-coagulation. She has history of paroxysmal atrial fibrillation (managed by Cardiology). She is on coumadin 3 mg M, W, F and 4.5 mg T, Th, and Sa. No episodes of bleeding. INR today is therapuetic at 2.5 Past Medical History  Diagnosis Date  . SSS (sick sinus syndrome) ([place])     a. 2008 s/p MDT PPM;  b. 11/2013 Gen change- MDT ADDRL1 Adapta DC PPM, ser # [id number] H.  Marland Kitchen PAF (paroxysmal atrial fibrillation) ([place])   . Unspecified essential hypertension   . Esophageal reflux   . Unspecified glaucoma   . Morbid obesity ([place])   . History of UTI   . Syncope and collapse     a. Felt to be vasovagal.  . Hyperlipidemia   . OSA on CPAP   . Type II diabetes mellitus ([place])   . Anemia   . H/O hiatal hernia   . Arthritis     a. shoulders.  . Anxiety   . Chronic diastolic heart failure ([place])     a. 09/2013 Echo: EF 50-55%, mild LVH, mild to mod MR/TR.  . 1St degree AV block     a. PR 400 msec with Apacing  . Symptomatic PVCs   . Chest pain     a. 2004 Cath:  reportedly nl;  b. 09/2013 Neg MV.    Past Surgical History  Procedure Laterality Date  . Vaginal hysterectomy  1970  . Incision and drainage of wound Right 03/1998    "leg; it was like a boil" (12/01/2013)  . Insert / replace / remove pacemaker  2008; 12/01/2013  MDT ADDRL1 pacemaker - gen change by Dr Caryl Comes 12/01/2013  . Dilation and curettage of uterus    . Permanent pacemaker generator change N/A 12/01/2013    Procedure: PERMANENT PACEMAKER GENERATOR CHANGE;  Surgeon: Deboraha Sprang, MD;  Location: [facility] CATH LAB;  Service: Cardiovascular;  Laterality: N/A;  . Lung biopsy  2010    [facility]    Family History  Problem Relation Age of Onset  . Liver cancer Mother   . Pancreatic cancer Mother   . Heart disease Father   .  Rheum arthritis Father   . Allergies Father     Social History   Social History  . Marital Status: Widowed    Spouse Name: N/A  . Number of Children: Y  . Years of Education: N/A   Occupational History  . retired    Social History Main Topics  . Smoking status: Former Smoker -- 0.50 packs/day for 34 years    Types: Cigarettes    Quit date: 08/15/1989  . Smokeless tobacco: Never Used  . Alcohol Use: No  . Drug Use: No  . Sexual Activity: No   Other Topics Concern  . Not on file   Social History Narrative   Retired. Widowed. Regularly exercises.      Current outpatient prescriptions:  .  ALPRAZolam (XANAX) 0.5 MG tablet, Take 1 tablet (0.5 mg total) by mouth daily as needed., Disp: 90 tablet, Rfl: 0 .  amLODipine (NORVASC) 10 MG tablet, Take 1 tablet (10 mg total) by mouth daily., Disp: 90 tablet, Rfl: 0 .  atorvastatin (LIPITOR) 20 MG tablet, Take 1 tablet (20 mg total) by mouth daily., Disp: 90 tablet, Rfl: 0 .  Blood Glucose Monitoring Suppl (ACCU-CHEK AVIVA PLUS) W/DEVICE KIT, daily. , Disp: , Rfl:  .  Brinzolamide-Brimonidine (SIMBRINZA) 1-0.2 % SUSP, Place 1 drop into both eyes 2 (two) times daily., Disp: 1 Bottle, Rfl: 1 .  colchicine 0.6 MG tablet, Take 1 tablet (0.6 mg total) by mouth 2 (two) times daily. 1.2 mg po X 1, then 0.6 mg po1 hr later x 1., Disp: 14 tablet, Rfl: 2 .  esomeprazole (NEXIUM) 40 MG capsule, Take 1 capsule (40 mg total) by mouth daily., Disp: 90 capsule, Rfl: 0 .  furosemide (LASIX) 20 MG tablet, Take 20 mg by mouth daily as needed for fluid., Disp: , Rfl:  .  glipiZIDE (GLUCOTROL) 10 MG tablet, Take 1 tablet (10 mg total) by mouth 2 (two) times daily before a meal., Disp: 135 tablet, Rfl: 0 .  glucose blood (ACCU-CHEK AVIVA PLUS) test strip, Test Twice daily, Disp: 200 each, Rfl: 0 .  metFORMIN (GLUCOPHAGE-XR) 500 MG 24 hr tablet, Take 1 tablet (500 mg total) by mouth 2 (two) times daily., Disp: 180 tablet, Rfl: 0 .  MICROLET LANCETS MISC, Use  as directed to check BG QD, Disp: 60 each, Rfl: 2 .  mometasone (ELOCON) 0.1 % cream, Apply topically daily. Apply to ear as directed two times a day for 10 days as needed   , Disp: , Rfl:  .  Multiple Vitamins-Minerals (CENTRUM SILVER PO), Take by mouth daily., Disp: , Rfl:  .  Nebivolol HCl (BYSTOLIC) 20 MG TABS, Take 1 tablet (20 mg total) by mouth 2 (two) times daily., Disp: 180 tablet, Rfl: 3 .  olmesartan (BENICAR) 40 MG tablet, Take 1 tablet (40 mg total) by mouth daily., Disp: 90 tablet, Rfl: 0 .  Omega-3 Fatty Acids (FISH OIL) 1200 MG CAPS, Take 1,200 mg by mouth  daily., Disp: , Rfl:  .  TRAVATAN Z 0.004 % SOLN ophthalmic solution, , Disp: , Rfl:  .  travoprost, benzalkonium, (TRAVATAN) 0.004 % ophthalmic solution, Place 1 drop into both eyes at bedtime. , Disp: , Rfl:  .  warfarin (COUMADIN) 3 MG tablet, Take 1-1.5 tablets (3-4.5 mg total) by mouth daily at 6 PM. Takes 48m on Tues, Thurs, Sat and Sun. Takes 4.552mon Mon, Wed and Friday., Disp: 100 tablet, Rfl: 3  Allergies  Allergen Reactions  . Levofloxacin In D5w Nausea Only and Nausea And Vomiting   Review of Systems  Constitutional: Positive for chills, malaise/fatigue and fatigue.  HENT: Positive for hoarse voice and sneezing. Negative for congestion, ear pain, nosebleeds, sinus pressure and sore throat.   Eyes: Negative for blurred vision.  Respiratory: Positive for cough. Negative for hemoptysis and shortness of breath.   Cardiovascular: Negative for chest pain and palpitations.  Gastrointestinal: Negative for blood in stool.  Genitourinary: Negative for hematuria.  Musculoskeletal: Negative for myalgias.  Neurological: Negative for headaches.  Endo/Heme/Allergies: Negative for polydipsia.   Objective  Filed Vitals:   09/14/15 1003  BP: 120/76  Pulse: 71  Temp: 97.2 F (36.2 C)  TempSrc: Oral  Resp: 18  Height: '5\' 5"'  (1.651 m)  Weight: 246 lb 4.8 oz (111.721 kg)  SpO2: 96%    Physical Exam  Constitutional:  She is well-developed, well-nourished, and in no distress.  HENT:  Head: Normocephalic and atraumatic.  Nose: No sinus tenderness. Right sinus exhibits no maxillary sinus tenderness and no frontal sinus tenderness. Left sinus exhibits no maxillary sinus tenderness and no frontal sinus tenderness.  Mouth/Throat: No posterior oropharyngeal edema or posterior oropharyngeal erythema.  Mucosal swelling right nostril, mild turbinate hypertrophy.  Cardiovascular: Normal rate and regular rhythm.   Pulmonary/Chest: Effort normal and breath sounds normal.  Abdominal: Soft. Bowel sounds are normal. There is no tenderness.  Musculoskeletal:       Right ankle: She exhibits swelling. Tenderness.       Left ankle: She exhibits swelling. Tenderness.  Nursing note and vitals reviewed.  Assessment & Plan  1. Other seasonal allergic rhinitis Recommended Flonase to be used for relief of nasal congestion and inflammation. - fluticasone (FLONASE) 50 MCG/ACT nasal spray; Place 2 sprays into both nostrils daily.  Dispense: 16 g; Refill: 0  2. Essential hypertension Blood pressure is stable and controlled on present antihypertensive therapy.  3. Controlled type 2 diabetes mellitus without complication, without long-term current use of insulin (HCC) A1c of 7.6%, which is improved from 8.1% from July 2016. Continue on present therapy and recheck in 3-4 months. - POCT HgB A1C  4. Anticoagulation monitoring, INR range 2-3 INR of 2.5 which is considered at goal. Continue Coumadin according to present schedule and recheck in one month. - POCT INR  5. Dyslipidemia associated with type 2 diabetes mellitus (HCC)  - Comprehensive Metabolic Panel (CMET) - Lipid Profile  6. Paroxysmal atrial fibrillation (HCPortageFollowed by cardiology. Currently in sinus rhythm. Continue beta blocker.   Dolores Ewing Asad A. [facility] 09/14/2015 10:33 AM

## 2015-09-16 ENCOUNTER — Telehealth: Payer: Self-pay | Admitting: Family Medicine

## 2015-09-16 DIAGNOSIS — E785 Hyperlipidemia, unspecified: Secondary | ICD-10-CM | POA: Diagnosis not present

## 2015-09-16 DIAGNOSIS — E1169 Type 2 diabetes mellitus with other specified complication: Secondary | ICD-10-CM | POA: Diagnosis not present

## 2015-09-16 NOTE — Telephone Encounter (Signed)
Pt was just here this week and states she would like something called into [facility] states her symptoms are runny nose,cough,runny eyes.

## 2015-09-16 NOTE — Telephone Encounter (Signed)
Called patient to discuss her symptoms and no one answered after multiple rings. Please have patient contact our office.

## 2015-09-17 ENCOUNTER — Encounter: Payer: Self-pay | Admitting: Family Medicine

## 2015-09-17 ENCOUNTER — Ambulatory Visit (INDEPENDENT_AMBULATORY_CARE_PROVIDER_SITE_OTHER): Payer: Medicare Other | Admitting: Family Medicine

## 2015-09-17 VITALS — BP 120/70 | HR 71 | Temp 97.8°F | Resp 19 | Ht 65.0 in | Wt 243.5 lb

## 2015-09-17 DIAGNOSIS — J0100 Acute maxillary sinusitis, unspecified: Secondary | ICD-10-CM | POA: Diagnosis not present

## 2015-09-17 LAB — COMPREHENSIVE METABOLIC PANEL
ALT: 17 IU/L (ref 0–32)
AST: 21 IU/L (ref 0–40)
Albumin/Globulin Ratio: 1.7 (ref 1.1–2.5)
Albumin: 4.2 g/dL (ref 3.5–4.8)
Alkaline Phosphatase: 94 IU/L (ref 39–117)
BUN/Creatinine Ratio: 12 (ref 11–26)
BUN: 13 mg/dL (ref 8–27)
Bilirubin Total: 0.4 mg/dL (ref 0.0–1.2)
CO2: 25 mmol/L (ref 18–29)
Calcium: 9.4 mg/dL (ref 8.7–10.3)
Chloride: 105 mmol/L (ref 97–108)
Creatinine, Ser: 1.13 mg/dL — ABNORMAL HIGH (ref 0.57–1.00)
GFR calc Af Amer: 54 mL/min/{1.73_m2} — ABNORMAL LOW (ref >59)
GFR calc non Af Amer: 47 mL/min/{1.73_m2} — ABNORMAL LOW (ref >59)
Globulin, Total: 2.5 g/dL (ref 1.5–4.5)
Glucose: 152 mg/dL — ABNORMAL HIGH (ref 65–99)
Potassium: 4 mmol/L (ref 3.5–5.2)
Sodium: 145 mmol/L — ABNORMAL HIGH (ref 134–144)
Total Protein: 6.7 g/dL (ref 6.0–8.5)

## 2015-09-17 LAB — LIPID PANEL
Chol/HDL Ratio: 3 ratio units (ref 0.0–4.4)
Cholesterol, Total: 146 mg/dL (ref 100–199)
HDL: 49 mg/dL (ref >39)
LDL Calculated: 78 mg/dL (ref 0–99)
Triglycerides: 97 mg/dL (ref 0–149)
VLDL Cholesterol Cal: 19 mg/dL (ref 5–40)

## 2015-09-17 MED ORDER — AZITHROMYCIN 250 MG PO TABS: ORAL_TABLET | ORAL | Status: DC

## 2015-09-17 NOTE — Progress Notes (Signed)
Name: [name] [name]   MRN: [id number]    DOB: September 01, 1935   Date:09/17/2015       Progress Note  Subjective  Chief Complaint  Chief Complaint  Patient presents with  . Acute Visit    Sinus  . Diabetes  . Hyperlipidemia  . Gout   Sinusitis This is a new problem. There has been no fever. The pain is moderate. Associated symptoms include congestion, coughing, headaches and sinus pressure. Pertinent negatives include no chills, shortness of breath or sore throat. Past treatments include nothing.   patient reports nasal congestion, frequent throat clearing, headache, and pressure over the sinuses. Past Medical History  Diagnosis Date  . SSS (sick sinus syndrome) ([place])     a. 2008 s/p MDT PPM;  b. 11/2013 Gen change- MDT ADDRL1 Adapta DC PPM, ser # [id number] H.  Marland Kitchen PAF (paroxysmal atrial fibrillation) ([place])   . Unspecified essential hypertension   . Esophageal reflux   . Unspecified glaucoma   . Morbid obesity ([place])   . History of UTI   . Syncope and collapse     a. Felt to be vasovagal.  . Hyperlipidemia   . OSA on CPAP   . Type II diabetes mellitus ([place])   . Anemia   . H/O hiatal hernia   . Arthritis     a. shoulders.  . Anxiety   . Chronic diastolic heart failure ([place])     a. 09/2013 Echo: EF 50-55%, mild LVH, mild to mod MR/TR.  . 1St degree AV block     a. PR 400 msec with Apacing  . Symptomatic PVCs   . Chest pain     a. 2004 Cath:  reportedly nl;  b. 09/2013 Neg MV.    Past Surgical History  Procedure Laterality Date  . Vaginal hysterectomy  1970  . Incision and drainage of wound Right 03/1998    "leg; it was like a boil" (12/01/2013)  . Insert / replace / remove pacemaker  2008; 12/01/2013    MDT ADDRL1 pacemaker - gen change by Dr Caryl Comes 12/01/2013  . Dilation and curettage of uterus    . Permanent pacemaker generator change N/A 12/01/2013    Procedure: PERMANENT PACEMAKER GENERATOR CHANGE;  Surgeon: Deboraha Sprang, MD;  Location: [facility] CATH LAB;  Service:  Cardiovascular;  Laterality: N/A;  . Lung biopsy  2010    [facility]    Family History  Problem Relation Age of Onset  . Liver cancer Mother   . Pancreatic cancer Mother   . Heart disease Father   . Rheum arthritis Father   . Allergies Father     Social History   Social History  . Marital Status: Widowed    Spouse Name: N/A  . Number of Children: Y  . Years of Education: N/A   Occupational History  . retired    Social History Main Topics  . Smoking status: Former Smoker -- 0.50 packs/day for 34 years    Types: Cigarettes    Quit date: 08/15/1989  . Smokeless tobacco: Never Used  . Alcohol Use: No  . Drug Use: No  . Sexual Activity: No   Other Topics Concern  . Not on file   Social History Narrative   Retired. Widowed. Regularly exercises.      Current outpatient prescriptions:  .  ALPRAZolam (XANAX) 0.5 MG tablet, Take 1 tablet (0.5 mg total) by mouth daily as needed., Disp: 90 tablet, Rfl: 0 .  amLODipine (NORVASC) 10 MG tablet, Take  1 tablet (10 mg total) by mouth daily., Disp: 90 tablet, Rfl: 0 .  atorvastatin (LIPITOR) 20 MG tablet, Take 1 tablet (20 mg total) by mouth daily., Disp: 90 tablet, Rfl: 0 .  Blood Glucose Monitoring Suppl (ACCU-CHEK AVIVA PLUS) W/DEVICE KIT, daily. , Disp: , Rfl:  .  Brinzolamide-Brimonidine (SIMBRINZA) 1-0.2 % SUSP, Place 1 drop into both eyes 2 (two) times daily., Disp: 1 Bottle, Rfl: 1 .  colchicine 0.6 MG tablet, Take 1 tablet (0.6 mg total) by mouth 2 (two) times daily. 1.2 mg po X 1, then 0.6 mg po1 hr later x 1., Disp: 14 tablet, Rfl: 2 .  esomeprazole (NEXIUM) 40 MG capsule, Take 1 capsule (40 mg total) by mouth daily., Disp: 90 capsule, Rfl: 0 .  fluticasone (FLONASE) 50 MCG/ACT nasal spray, Place 2 sprays into both nostrils daily., Disp: 16 g, Rfl: 0 .  furosemide (LASIX) 20 MG tablet, Take 20 mg by mouth daily as needed for fluid., Disp: , Rfl:  .  glipiZIDE (GLUCOTROL) 10 MG tablet, Take 1 tablet (10 mg total) by mouth 2 (two)  times daily before a meal., Disp: 135 tablet, Rfl: 0 .  glucose blood (ACCU-CHEK AVIVA PLUS) test strip, Test Twice daily, Disp: 200 each, Rfl: 0 .  metFORMIN (GLUCOPHAGE-XR) 500 MG 24 hr tablet, Take 1 tablet (500 mg total) by mouth 2 (two) times daily., Disp: 180 tablet, Rfl: 0 .  MICROLET LANCETS MISC, Use as directed to check BG QD, Disp: 60 each, Rfl: 2 .  mometasone (ELOCON) 0.1 % cream, Apply topically daily. Apply to ear as directed two times a day for 10 days as needed   , Disp: , Rfl:  .  Multiple Vitamins-Minerals (CENTRUM SILVER PO), Take by mouth daily., Disp: , Rfl:  .  Nebivolol HCl (BYSTOLIC) 20 MG TABS, Take 1 tablet (20 mg total) by mouth 2 (two) times daily., Disp: 180 tablet, Rfl: 3 .  olmesartan (BENICAR) 40 MG tablet, Take 1 tablet (40 mg total) by mouth daily., Disp: 90 tablet, Rfl: 0 .  Omega-3 Fatty Acids (FISH OIL) 1200 MG CAPS, Take 1,200 mg by mouth daily., Disp: , Rfl:  .  TRAVATAN Z 0.004 % SOLN ophthalmic solution, , Disp: , Rfl:  .  travoprost, benzalkonium, (TRAVATAN) 0.004 % ophthalmic solution, Place 1 drop into both eyes at bedtime. , Disp: , Rfl:  .  warfarin (COUMADIN) 3 MG tablet, Take 1-1.5 tablets (3-4.5 mg total) by mouth daily at 6 PM. Takes 42m on Tues, Thurs, Sat and Sun. Takes 4.521mon Mon, Wed and Friday., Disp: 100 tablet, Rfl: 3  Allergies  Allergen Reactions  . Levofloxacin In D5w Nausea Only and Nausea And Vomiting   Review of Systems  Constitutional: Negative for fever and chills.  HENT: Positive for congestion and sinus pressure. Negative for sore throat.   Respiratory: Positive for cough. Negative for shortness of breath.   Neurological: Positive for headaches.    Objective  Filed Vitals:   09/17/15 0927  BP: 120/70  Pulse: 71  Temp: 97.8 F (36.6 C)  TempSrc: Oral  Resp: 19  Height: '5\' 5"'  (1.651 m)  Weight: 243 lb 8 oz (110.451 kg)  SpO2: 95%    Physical Exam  Constitutional: She is well-developed, well-nourished, and in  no distress. Vital signs are normal.  HENT:  Head: Normocephalic.  Nose: No mucosal edema or sinus tenderness. Right sinus exhibits maxillary sinus tenderness. Right sinus exhibits no frontal sinus tenderness. Left sinus exhibits maxillary sinus  tenderness. Left sinus exhibits no frontal sinus tenderness.  Mouth/Throat: No posterior oropharyngeal erythema.  Cardiovascular: Normal heart sounds.   Pulmonary/Chest: Breath sounds normal.  Nursing note and vitals reviewed.  Assessment & Plan  1. Acute non-recurrent maxillary sinusitis Symptoms and exam consistent with acute maxillary sinusitis. Prescription for Z-Pak provided. Encourage fluid intake. - azithromycin (ZITHROMAX) 250 MG tablet; 2 tabs po x day 1, then 1 tab po q day x 4 days  Dispense: 6 tablet; Refill: 0 .  Arlando Leisinger Asad A. [facility] 09/17/2015 9:56 AM

## 2015-09-17 NOTE — Telephone Encounter (Signed)
Patient was seen in clinic on today 09/17/2015

## 2015-09-22 ENCOUNTER — Telehealth: Payer: Self-pay | Admitting: Family Medicine

## 2015-09-22 MED ORDER — GLIPIZIDE 10 MG PO TABS: 10.0000 mg | ORAL_TABLET | Freq: Two times a day (BID) | ORAL | Status: DC

## 2015-09-22 NOTE — Telephone Encounter (Signed)
Glipizide 10 MG tablet has been refilled and sent to [facility] [address] [address] per pharmacy request

## 2015-09-25 ENCOUNTER — Other Ambulatory Visit: Payer: Self-pay | Admitting: Family Medicine

## 2015-10-01 ENCOUNTER — Telehealth: Payer: Self-pay | Admitting: Family Medicine

## 2015-10-01 DIAGNOSIS — I10 Essential (primary) hypertension: Secondary | ICD-10-CM

## 2015-10-01 MED ORDER — OLMESARTAN MEDOXOMIL 40 MG PO TABS: 40.0000 mg | ORAL_TABLET | Freq: Every day | ORAL | Status: DC

## 2015-10-01 NOTE — Telephone Encounter (Signed)
Medication has been sent to Dr. Manuella Ghazi to sign off on refills

## 2015-10-01 NOTE — Telephone Encounter (Signed)
Pt needs refill on Benicar to be sent to [place].

## 2015-10-10 DIAGNOSIS — G4733 Obstructive sleep apnea (adult) (pediatric): Secondary | ICD-10-CM | POA: Diagnosis not present

## 2015-10-11 ENCOUNTER — Encounter: Payer: Self-pay | Admitting: Family Medicine

## 2015-10-11 ENCOUNTER — Ambulatory Visit (INDEPENDENT_AMBULATORY_CARE_PROVIDER_SITE_OTHER): Payer: Medicare Other | Admitting: Family Medicine

## 2015-10-11 VITALS — BP 120/71 | HR 78 | Temp 98.7°F | Resp 18 | Ht 65.0 in | Wt 247.3 lb

## 2015-10-11 DIAGNOSIS — H6002 Abscess of left external ear: Secondary | ICD-10-CM

## 2015-10-11 DIAGNOSIS — H60392 Other infective otitis externa, left ear: Secondary | ICD-10-CM

## 2015-10-11 MED ORDER — CEPHALEXIN 500 MG PO CAPS: 500.0000 mg | ORAL_CAPSULE | Freq: Three times a day (TID) | ORAL | Status: DC

## 2015-10-11 NOTE — Progress Notes (Signed)
Name: [name] [name]   MRN: [id number]    DOB: [birth year]   Date:10/11/2015       Progress Note  Subjective  Chief Complaint  Chief Complaint  Patient presents with  . Acute Visit    check sore on left ear  . Diabetes  . Hyperlipidemia  . Gout    HPI  Pt. Reports a 'sore' on her left ear, present since September 2016. She has been applying Neosporin ointment to the lesion. Reports tenderness around the lesion, no drainage.  Past Medical History  Diagnosis Date  . SSS (sick sinus syndrome) ([place])     a. 2008 s/p MDT PPM;  b. 11/2013 Gen change- MDT ADDRL1 Adapta DC PPM, ser # [id number] H.  Marland Kitchen PAF (paroxysmal atrial fibrillation) ([place])   . Unspecified essential hypertension   . Esophageal reflux   . Unspecified glaucoma   . Morbid obesity ([place])   . History of UTI   . Syncope and collapse     a. Felt to be vasovagal.  . Hyperlipidemia   . OSA on CPAP   . Type II diabetes mellitus ([place])   . Anemia   . H/O hiatal hernia   . Arthritis     a. shoulders.  . Anxiety   . Chronic diastolic heart failure ([place])     a. 09/2013 Echo: EF 50-55%, mild LVH, mild to mod MR/TR.  . 1St degree AV block     a. PR 400 msec with Apacing  . Symptomatic PVCs   . Chest pain     a. 2004 Cath:  reportedly nl;  b. 09/2013 Neg MV.    Past Surgical History  Procedure Laterality Date  . Vaginal hysterectomy  1970  . Incision and drainage of wound Right 03/1998    "leg; it was like a boil" (12/01/2013)  . Insert / replace / remove pacemaker  2008; 12/01/2013    MDT ADDRL1 pacemaker - gen change by Dr Caryl Comes 12/01/2013  . Dilation and curettage of uterus    . Permanent pacemaker generator change N/A 12/01/2013    Procedure: PERMANENT PACEMAKER GENERATOR CHANGE;  Surgeon: Deboraha Sprang, MD;  Location: [facility] CATH LAB;  Service: Cardiovascular;  Laterality: N/A;  . Lung biopsy  2010    [facility]    Family History  Problem Relation Age of Onset  . Liver cancer Mother   . Pancreatic cancer Mother   . Heart  disease Father   . Rheum arthritis Father   . Allergies Father     Social History   Social History  . Marital Status: Widowed    Spouse Name: N/A  . Number of Children: Y  . Years of Education: N/A   Occupational History  . retired    Social History Main Topics  . Smoking status: Former Smoker -- 0.50 packs/day for 34 years    Types: Cigarettes    Quit date: 08/15/1989  . Smokeless tobacco: Never Used  . Alcohol Use: No  . Drug Use: No  . Sexual Activity: No   Other Topics Concern  . Not on file   Social History Narrative   Retired. Widowed. Regularly exercises.      Current outpatient prescriptions:  .  ALPRAZolam (XANAX) 0.5 MG tablet, Take 1 tablet (0.5 mg total) by mouth daily as needed., Disp: 90 tablet, Rfl: 0 .  amLODipine (NORVASC) 10 MG tablet, Take 1 tablet (10 mg total) by mouth daily., Disp: 90 tablet, Rfl: 0 .  atorvastatin (LIPITOR) 20  MG tablet, Take 1 tablet (20 mg total) by mouth daily., Disp: 90 tablet, Rfl: 0 .  azithromycin (ZITHROMAX) 250 MG tablet, 2 tabs po x day 1, then 1 tab po q day x 4 days, Disp: 6 tablet, Rfl: 0 .  Blood Glucose Monitoring Suppl (ACCU-CHEK AVIVA PLUS) W/DEVICE KIT, daily. , Disp: , Rfl:  .  Brinzolamide-Brimonidine (SIMBRINZA) 1-0.2 % SUSP, Place 1 drop into both eyes 2 (two) times daily., Disp: 1 Bottle, Rfl: 1 .  colchicine 0.6 MG tablet, Take 1 tablet (0.6 mg total) by mouth 2 (two) times daily. 1.2 mg po X 1, then 0.6 mg po1 hr later x 1., Disp: 14 tablet, Rfl: 2 .  esomeprazole (NEXIUM) 40 MG capsule, Take 1 capsule (40 mg total) by mouth daily., Disp: 90 capsule, Rfl: 0 .  fluticasone (FLONASE) 50 MCG/ACT nasal spray, Place 2 sprays into both nostrils daily., Disp: 16 g, Rfl: 0 .  furosemide (LASIX) 20 MG tablet, Take 20 mg by mouth daily as needed for fluid., Disp: , Rfl:  .  glipiZIDE (GLUCOTROL) 10 MG tablet, Take 1 tablet (10 mg total) by mouth 2 (two) times daily before a meal., Disp: 135 tablet, Rfl: 0 .  glucose  blood (ACCU-CHEK AVIVA PLUS) test strip, Test Twice daily, Disp: 200 each, Rfl: 0 .  metFORMIN (GLUCOPHAGE-XR) 500 MG 24 hr tablet, Take 1 tablet (500 mg total) by mouth 2 (two) times daily., Disp: 180 tablet, Rfl: 0 .  MICROLET LANCETS MISC, Use as directed to check BG QD, Disp: 60 each, Rfl: 2 .  mometasone (ELOCON) 0.1 % cream, Apply topically daily. Apply to ear as directed two times a day for 10 days as needed   , Disp: , Rfl:  .  Multiple Vitamins-Minerals (CENTRUM SILVER PO), Take by mouth daily., Disp: , Rfl:  .  Nebivolol HCl (BYSTOLIC) 20 MG TABS, Take 1 tablet (20 mg total) by mouth 2 (two) times daily., Disp: 180 tablet, Rfl: 3 .  olmesartan (BENICAR) 40 MG tablet, Take 1 tablet (40 mg total) by mouth daily., Disp: 90 tablet, Rfl: 0 .  Omega-3 Fatty Acids (FISH OIL) 1200 MG CAPS, Take 1,200 mg by mouth daily., Disp: , Rfl:  .  TRAVATAN Z 0.004 % SOLN ophthalmic solution, , Disp: , Rfl:  .  travoprost, benzalkonium, (TRAVATAN) 0.004 % ophthalmic solution, Place 1 drop into both eyes at bedtime. , Disp: , Rfl:  .  warfarin (COUMADIN) 3 MG tablet, Take 1-1.5 tablets (3-4.5 mg total) by mouth daily at 6 PM. Takes 93m on Tues, Thurs, Sat and Sun. Takes 4.549mon Mon, Wed and Friday., Disp: 100 tablet, Rfl: 3  Allergies  Allergen Reactions  . Levofloxacin In D5w Nausea Only and Nausea And Vomiting     Review of Systems  Constitutional: Negative for fever, chills, weight loss and malaise/fatigue.  Skin: Positive for itching and rash.    Objective  Filed Vitals:   10/11/15 1438  BP: 120/71  Pulse: 78  Temp: 98.7 F (37.1 C)  TempSrc: Oral  Resp: 18  Height: _0  (1.651 m)  Weight: 247 lb 4.8 oz (112.175 kg)  SpO2: 93%    Physical Exam  Constitutional: She is well-developed, well-nourished, and in no distress.  Skin: Skin is warm.  Macular, erythematous, blister-like lesion over the left ear, no pus or drainage visible.  Nursing note and vitals reviewed.   Assessment &  Plan  1. Infection of skin of left ear lobe  Appears consistent with  cellulitis of the left ear. We will start on Keflex 500 mg 3 times daily. RTC if no clinical improvement within 2-3 days. - cephALEXin (KEFLEX) 500 MG capsule; Take 1 capsule (500 mg total) by mouth 3 (three) times daily.  Dispense: 30 capsule; Refill: 0   Kylea Berrong Asad A. [facility] 10/11/2015 3:01 PM

## 2015-10-14 ENCOUNTER — Ambulatory Visit (INDEPENDENT_AMBULATORY_CARE_PROVIDER_SITE_OTHER): Payer: Medicare Other | Admitting: Family Medicine

## 2015-10-14 ENCOUNTER — Encounter: Payer: Self-pay | Admitting: Family Medicine

## 2015-10-14 VITALS — BP 128/76 | HR 78 | Temp 98.7°F | Resp 12 | Wt 247.7 lb

## 2015-10-14 DIAGNOSIS — H60392 Other infective otitis externa, left ear: Secondary | ICD-10-CM

## 2015-10-14 DIAGNOSIS — H6002 Abscess of left external ear: Secondary | ICD-10-CM

## 2015-10-14 MED ORDER — CLINDAMYCIN HCL 300 MG PO CAPS: 300.0000 mg | ORAL_CAPSULE | Freq: Four times a day (QID) | ORAL | Status: DC

## 2015-10-14 NOTE — Progress Notes (Signed)
Name: [name] [name]   MRN: [id number]    DOB: 1935/09/06   Date:10/14/2015       Progress Note  Subjective  Chief Complaint  Chief Complaint  Patient presents with  . Medication Reaction    patient stated that since taking the Keflex her reflux has increased.    HPI  Pt. Is here for concerns about possible side effects experienced after she was started on Keflex for possible infection over the left ear. She reports the antibiotic 'flared up my acid reflux.' She states she has burnign sensation in her throat, reflux, and nausea soon after starting the antibiotic and has since discontinued therapy.the rash and swelling over the left ear appears to be improving but not resolved. she presents for consideration of alternative antibiotic therapy.  Past Medical History  Diagnosis Date  . SSS (sick sinus syndrome) ([place])     a. 2008 s/p MDT PPM;  b. 11/2013 Gen change- MDT ADDRL1 Adapta DC PPM, ser # [id number] H.  Marland Kitchen PAF (paroxysmal atrial fibrillation) ([place])   . Unspecified essential hypertension   . Esophageal reflux   . Unspecified glaucoma   . Morbid obesity ([place])   . History of UTI   . Syncope and collapse     a. Felt to be vasovagal.  . Hyperlipidemia   . OSA on CPAP   . Type II diabetes mellitus ([place])   . Anemia   . H/O hiatal hernia   . Arthritis     a. shoulders.  . Anxiety   . Chronic diastolic heart failure ([place])     a. 09/2013 Echo: EF 50-55%, mild LVH, mild to mod MR/TR.  . 1St degree AV block     a. PR 400 msec with Apacing  . Symptomatic PVCs   . Chest pain     a. 2004 Cath:  reportedly nl;  b. 09/2013 Neg MV.    Past Surgical History  Procedure Laterality Date  . Vaginal hysterectomy  1970  . Incision and drainage of wound Right 03/1998    "leg; it was like a boil" (12/01/2013)  . Insert / replace / remove pacemaker  2008; 12/01/2013    MDT ADDRL1 pacemaker - gen change by Dr Caryl Comes 12/01/2013  . Dilation and curettage of uterus    . Permanent pacemaker  generator change N/A 12/01/2013    Procedure: PERMANENT PACEMAKER GENERATOR CHANGE;  Surgeon: Deboraha Sprang, MD;  Location: [facility] CATH LAB;  Service: Cardiovascular;  Laterality: N/A;  . Lung biopsy  2010    [facility]    Family History  Problem Relation Age of Onset  . Liver cancer Mother   . Pancreatic cancer Mother   . Heart disease Father   . Rheum arthritis Father   . Allergies Father     Social History   Social History  . Marital Status: Widowed    Spouse Name: N/A  . Number of Children: Y  . Years of Education: N/A   Occupational History  . retired    Social History Main Topics  . Smoking status: Former Smoker -- 0.50 packs/day for 34 years    Types: Cigarettes    Quit date: 08/15/1989  . Smokeless tobacco: Never Used  . Alcohol Use: No  . Drug Use: No  . Sexual Activity: No   Other Topics Concern  . Not on file   Social History Narrative   Retired. Widowed. Regularly exercises.      Current outpatient prescriptions:  .  ALPRAZolam Duanne Moron)  0.5 MG tablet, Take 1 tablet (0.5 mg total) by mouth daily as needed., Disp: 90 tablet, Rfl: 0 .  amLODipine (NORVASC) 10 MG tablet, Take 1 tablet (10 mg total) by mouth daily., Disp: 90 tablet, Rfl: 0 .  atorvastatin (LIPITOR) 20 MG tablet, Take 1 tablet (20 mg total) by mouth daily., Disp: 90 tablet, Rfl: 0 .  Blood Glucose Monitoring Suppl (ACCU-CHEK AVIVA PLUS) W/DEVICE KIT, daily. , Disp: , Rfl:  .  Brinzolamide-Brimonidine (SIMBRINZA) 1-0.2 % SUSP, Place 1 drop into both eyes 2 (two) times daily., Disp: 1 Bottle, Rfl: 1 .  esomeprazole (NEXIUM) 40 MG capsule, Take 1 capsule (40 mg total) by mouth daily., Disp: 90 capsule, Rfl: 0 .  fluticasone (FLONASE) 50 MCG/ACT nasal spray, Place 2 sprays into both nostrils daily., Disp: 16 g, Rfl: 0 .  furosemide (LASIX) 20 MG tablet, Take 20 mg by mouth daily as needed for fluid., Disp: , Rfl:  .  glipiZIDE (GLUCOTROL) 10 MG tablet, Take 1 tablet (10 mg total) by mouth 2 (two) times  daily before a meal., Disp: 135 tablet, Rfl: 0 .  glucose blood (ACCU-CHEK AVIVA PLUS) test strip, Test Twice daily, Disp: 200 each, Rfl: 0 .  metFORMIN (GLUCOPHAGE-XR) 500 MG 24 hr tablet, Take 1 tablet (500 mg total) by mouth 2 (two) times daily., Disp: 180 tablet, Rfl: 0 .  MICROLET LANCETS MISC, Use as directed to check BG QD, Disp: 60 each, Rfl: 2 .  mometasone (ELOCON) 0.1 % cream, Apply topically daily. Apply to ear as directed two times a day for 10 days as needed   , Disp: , Rfl:  .  Multiple Vitamins-Minerals (CENTRUM SILVER PO), Take by mouth daily., Disp: , Rfl:  .  Nebivolol HCl (BYSTOLIC) 20 MG TABS, Take 1 tablet (20 mg total) by mouth 2 (two) times daily., Disp: 180 tablet, Rfl: 3 .  olmesartan (BENICAR) 40 MG tablet, Take 1 tablet (40 mg total) by mouth daily., Disp: 90 tablet, Rfl: 0 .  Omega-3 Fatty Acids (FISH OIL) 1200 MG CAPS, Take 1,200 mg by mouth daily., Disp: , Rfl:  .  TRAVATAN Z 0.004 % SOLN ophthalmic solution, , Disp: , Rfl:  .  travoprost, benzalkonium, (TRAVATAN) 0.004 % ophthalmic solution, Place 1 drop into both eyes at bedtime. , Disp: , Rfl:  .  warfarin (COUMADIN) 3 MG tablet, Take 1-1.5 tablets (3-4.5 mg total) by mouth daily at 6 PM. Takes 37m on Tues, Thurs, Sat and Sun. Takes 4.568mon Mon, Wed and Friday., Disp: 100 tablet, Rfl: 3 .  cephALEXin (KEFLEX) 500 MG capsule, Take 1 capsule (500 mg total) by mouth 3 (three) times daily. (Patient not taking: Reported on 10/14/2015), Disp: 30 capsule, Rfl: 0 .  colchicine 0.6 MG tablet, Take 1 tablet (0.6 mg total) by mouth 2 (two) times daily. 1.2 mg po X 1, then 0.6 mg po1 hr later x 1. (Patient not taking: Reported on 10/14/2015), Disp: 14 tablet, Rfl: 2  Allergies  Allergen Reactions  . Levofloxacin In D5w Nausea Only and Nausea And Vomiting   Review of Systems  Constitutional: Negative for fever and chills.  Gastrointestinal: Positive for heartburn and nausea. Negative for vomiting.  Skin: Positive for rash.       Objective  Filed Vitals:   10/14/15 1501  BP: 128/76  Pulse: 78  Temp: 98.7 F (37.1 C)  TempSrc: Oral  Resp: 12  Weight: 247 lb 11.2 oz (112.356 kg)  SpO2: 97%    Physical Exam  Constitutional: She is well-developed, well-nourished, and in no distress.  HENT:  Head: Normocephalic and atraumatic.  Left ear lesion improving, erythema and tenderness decreased, no discharge.  Cardiovascular: Normal rate, regular rhythm and normal heart sounds.   No murmur heard. Pulmonary/Chest: Effort normal and breath sounds normal. She has no wheezes.  Nursing note and vitals reviewed.  Assessment & Plan  1. Infection of skin of left ear lobe  We will start on clindamycin 300 mg 4 times daily. A Manuella Ghazi and advised to follow-up if she experiences any antibiotic-associated adverse effects.  - clindamycin (CLEOCIN) 300 MG capsule; Take 1 capsule (300 mg total) by mouth 4 (four) times daily.  Dispense: 28 capsule; Refill: 0   Matthias Bogus Asad A. [facility] 10/14/2015 3:12 PM

## 2015-10-19 ENCOUNTER — Ambulatory Visit (INDEPENDENT_AMBULATORY_CARE_PROVIDER_SITE_OTHER): Payer: Medicare Other | Admitting: *Deleted

## 2015-10-19 DIAGNOSIS — I495 Sick sinus syndrome: Secondary | ICD-10-CM

## 2015-10-19 NOTE — Progress Notes (Signed)
Remote pacemaker transmission.   

## 2015-10-25 ENCOUNTER — Telehealth: Payer: Self-pay | Admitting: Family Medicine

## 2015-10-25 ENCOUNTER — Other Ambulatory Visit: Payer: Self-pay | Admitting: Family Medicine

## 2015-10-25 NOTE — Telephone Encounter (Signed)
Patient has been sick since Friday. Her symptoms are: cough, sneezing, runny nose, eyes running water. She is taking tylenol, flonase and allerga and nothing seems to work. Would like z-pack send [facility] [address]

## 2015-10-25 NOTE — Telephone Encounter (Signed)
Routed to Dr. Manuella Ghazi for prescription request

## 2015-10-25 NOTE — Telephone Encounter (Signed)
Spoke with patient and she will schedule an appointment for 10/26/2015 for assessment of symptoms.

## 2015-10-26 ENCOUNTER — Ambulatory Visit (INDEPENDENT_AMBULATORY_CARE_PROVIDER_SITE_OTHER): Payer: Medicare Other | Admitting: Family Medicine

## 2015-10-26 ENCOUNTER — Ambulatory Visit
Admission: RE | Admit: 2015-10-26 | Discharge: 2015-10-26 | Disposition: A | Discharge status: Home / Self Care | Payer: Medicare Other | Source: Ambulatory Visit | Attending: Family Medicine | Admitting: Family Medicine

## 2015-10-26 ENCOUNTER — Encounter: Payer: Self-pay | Admitting: Family Medicine

## 2015-10-26 VITALS — BP 134/68 | HR 79 | Temp 98.7°F | Resp 16 | Ht 65.0 in | Wt 246.1 lb

## 2015-10-26 DIAGNOSIS — R05 Cough: Secondary | ICD-10-CM | POA: Diagnosis not present

## 2015-10-26 DIAGNOSIS — I517 Cardiomegaly: Secondary | ICD-10-CM | POA: Insufficient documentation

## 2015-10-26 DIAGNOSIS — M8588 Other specified disorders of bone density and structure, other site: Secondary | ICD-10-CM | POA: Insufficient documentation

## 2015-10-26 DIAGNOSIS — Z7901 Long term (current) use of anticoagulants: Secondary | ICD-10-CM | POA: Diagnosis not present

## 2015-10-26 DIAGNOSIS — R058 Other specified cough: Secondary | ICD-10-CM

## 2015-10-26 MED ORDER — AZITHROMYCIN 250 MG PO TABS: ORAL_TABLET | ORAL | Status: DC

## 2015-10-26 MED ORDER — BENZONATATE 200 MG PO CAPS: 200.0000 mg | ORAL_CAPSULE | Freq: Three times a day (TID) | ORAL | Status: DC | PRN

## 2015-10-26 NOTE — Progress Notes (Signed)
Name: [name] [name]   MRN: [id number]    DOB: 1935-11-08   Date:10/26/2015       Progress Note  Subjective  Chief Complaint  Chief Complaint  Patient presents with  . URI    Onset- Friday evening, sneezing, coughing-yellow mucus, constant runny nose, runny eyes, chest discomfort from excessive coughing. Has tried taking Tylenol, Allegra, nasal spray with no relief.    URI  This is a new problem. Episode onset: 4 days ago. The problem has been unchanged. There has been no fever. Associated symptoms include congestion, coughing, ear pain, sneezing and wheezing. Pertinent negatives include no sinus pain or sore throat. She has tried antihistamine for the symptoms.    Past Medical History  Diagnosis Date  . SSS (sick sinus syndrome) ([place])     a. 2008 s/p MDT PPM;  b. 11/2013 Gen change- MDT ADDRL1 Adapta DC PPM, ser # [id number] H.  Marland Kitchen PAF (paroxysmal atrial fibrillation) ([place])   . Unspecified essential hypertension   . Esophageal reflux   . Unspecified glaucoma   . Morbid obesity ([place])   . History of UTI   . Syncope and collapse     a. Felt to be vasovagal.  . Hyperlipidemia   . OSA on CPAP   . Type II diabetes mellitus ([place])   . Anemia   . H/O hiatal hernia   . Arthritis     a. shoulders.  . Anxiety   . Chronic diastolic heart failure ([place])     a. 09/2013 Echo: EF 50-55%, mild LVH, mild to mod MR/TR.  . 1St degree AV block     a. PR 400 msec with Apacing  . Symptomatic PVCs   . Chest pain     a. 2004 Cath:  reportedly nl;  b. 09/2013 Neg MV.    Past Surgical History  Procedure Laterality Date  . Vaginal hysterectomy  1970  . Incision and drainage of wound Right 03/1998    "leg; it was like a boil" (12/01/2013)  . Insert / replace / remove pacemaker  2008; 12/01/2013    MDT ADDRL1 pacemaker - gen change by Dr Caryl Comes 12/01/2013  . Dilation and curettage of uterus    . Permanent pacemaker generator change N/A 12/01/2013    Procedure: PERMANENT PACEMAKER GENERATOR CHANGE;   Surgeon: Deboraha Sprang, MD;  Location: [facility] CATH LAB;  Service: Cardiovascular;  Laterality: N/A;  . Lung biopsy  2010    [facility]    Family History  Problem Relation Age of Onset  . Liver cancer Mother   . Pancreatic cancer Mother   . Heart disease Father   . Rheum arthritis Father   . Allergies Father     Social History   Social History  . Marital Status: Widowed    Spouse Name: N/A  . Number of Children: Y  . Years of Education: N/A   Occupational History  . retired    Social History Main Topics  . Smoking status: Former Smoker -- 0.50 packs/day for 34 years    Types: Cigarettes    Quit date: 08/15/1989  . Smokeless tobacco: Never Used  . Alcohol Use: No  . Drug Use: No  . Sexual Activity: No   Other Topics Concern  . Not on file   Social History Narrative   Retired. Widowed. Regularly exercises.      Current outpatient prescriptions:  .  ALPRAZolam (XANAX) 0.5 MG tablet, Take 1 tablet (0.5 mg total) by mouth daily as needed.,  Disp: 90 tablet, Rfl: 0 .  amLODipine (NORVASC) 10 MG tablet, Take 1 tablet (10 mg total) by mouth daily., Disp: 90 tablet, Rfl: 0 .  atorvastatin (LIPITOR) 20 MG tablet, Take 1 tablet (20 mg total) by mouth daily., Disp: 90 tablet, Rfl: 0 .  Blood Glucose Monitoring Suppl (ACCU-CHEK AVIVA PLUS) W/DEVICE KIT, daily. , Disp: , Rfl:  .  Brinzolamide-Brimonidine (SIMBRINZA) 1-0.2 % SUSP, Place 1 drop into both eyes 2 (two) times daily., Disp: 1 Bottle, Rfl: 1 .  clindamycin (CLEOCIN) 300 MG capsule, Take 1 capsule (300 mg total) by mouth 4 (four) times daily., Disp: 28 capsule, Rfl: 0 .  colchicine 0.6 MG tablet, Take 1 tablet (0.6 mg total) by mouth 2 (two) times daily. 1.2 mg po X 1, then 0.6 mg po1 hr later x 1., Disp: 14 tablet, Rfl: 2 .  esomeprazole (NEXIUM) 40 MG capsule, Take 1 capsule (40 mg total) by mouth daily., Disp: 90 capsule, Rfl: 0 .  fluticasone (FLONASE) 50 MCG/ACT nasal spray, Place 2 sprays into both nostrils daily., Disp: 16 g,  Rfl: 0 .  furosemide (LASIX) 20 MG tablet, Take 20 mg by mouth daily as needed for fluid., Disp: , Rfl:  .  glipiZIDE (GLUCOTROL) 10 MG tablet, Take 1 tablet (10 mg total) by mouth 2 (two) times daily before a meal., Disp: 135 tablet, Rfl: 0 .  glucose blood (ACCU-CHEK AVIVA PLUS) test strip, Test Twice daily, Disp: 200 each, Rfl: 0 .  metFORMIN (GLUCOPHAGE-XR) 500 MG 24 hr tablet, Take 1 tablet (500 mg total) by mouth 2 (two) times daily., Disp: 180 tablet, Rfl: 0 .  MICROLET LANCETS MISC, Use as directed to check BG QD, Disp: 60 each, Rfl: 2 .  mometasone (ELOCON) 0.1 % cream, Apply topically daily. Apply to ear as directed two times a day for 10 days as needed   , Disp: , Rfl:  .  Multiple Vitamins-Minerals (CENTRUM SILVER PO), Take by mouth daily., Disp: , Rfl:  .  Nebivolol HCl (BYSTOLIC) 20 MG TABS, Take 1 tablet (20 mg total) by mouth 2 (two) times daily., Disp: 180 tablet, Rfl: 3 .  olmesartan (BENICAR) 40 MG tablet, Take 1 tablet (40 mg total) by mouth daily., Disp: 90 tablet, Rfl: 0 .  Omega-3 Fatty Acids (FISH OIL) 1200 MG CAPS, Take 1,200 mg by mouth daily., Disp: , Rfl:  .  TRAVATAN Z 0.004 % SOLN ophthalmic solution, , Disp: , Rfl:  .  travoprost, benzalkonium, (TRAVATAN) 0.004 % ophthalmic solution, Place 1 drop into both eyes at bedtime. , Disp: , Rfl:  .  warfarin (COUMADIN) 3 MG tablet, TAKE 1-1.5 TABLETS DAILY AT 6 PM. TAKE 1 TAB ON TUES, THURS, SAT, SUN. TAKE 4.5 MG ON MON, WED, FRI, Disp: 100 tablet, Rfl: 3  Allergies  Allergen Reactions  . Levofloxacin In D5w Nausea Only and Nausea And Vomiting    Review of Systems  Constitutional: Positive for chills. Negative for fever.  HENT: Positive for congestion, ear pain and sneezing. Negative for sore throat.   Respiratory: Positive for cough, sputum production and wheezing. Negative for shortness of breath.    Objective  Filed Vitals:   10/26/15 1141  BP: 134/68  Pulse: 79  Temp: 98.7 F (37.1 C)  TempSrc: Oral  Resp:  16  Height: '5\' 5"'  (1.651 m)  Weight: 246 lb 1.6 oz (111.63 kg)  SpO2: 95%    Physical Exam  Constitutional: She is oriented to person, place, and time and well-developed,  well-nourished, and in no distress.  HENT:  Head: Normocephalic and atraumatic.  Right Ear: Tympanic membrane and ear canal normal.  Left Ear: Tympanic membrane and ear canal normal.  Nose: Right sinus exhibits no maxillary sinus tenderness and no frontal sinus tenderness. Left sinus exhibits no maxillary sinus tenderness and no frontal sinus tenderness.  Mouth/Throat: Mucous membranes are normal. Posterior oropharyngeal erythema present. No oropharyngeal exudate.  Cardiovascular: Normal rate, regular rhythm and normal heart sounds.   Pulmonary/Chest: Effort normal and breath sounds normal. No respiratory distress. She has no wheezes. She has no rales.  Neurological: She is alert and oriented to person, place, and time.  Nursing note and vitals reviewed.    Assessment & Plan  1. Productive cough Active cough with yellowish mucus not responsive to conservative OTC therapy. We'll start on Tessalon and Z-Pak. Obtain CXR to rule out pneumonia. - azithromycin (ZITHROMAX Z-PAK) 250 MG tablet; 2 tabs po x day 1, then 1 tab po q day x 4 days  Dispense: 6 each; Refill: 0 - benzonatate (TESSALON) 200 MG capsule; Take 1 capsule (200 mg total) by mouth 3 (three) times daily as needed for cough.  Dispense: 21 capsule; Refill: 0 - DG Chest 2 View; Future  2. Anticoagulation monitoring, INR range 2-3 INR machine in the clinic is out of order. Patient has an outstanding balance at [employer] [facility] and she wishes to defer checking INR till next month. No episodes of bleeding. Continue on present Coumadin therapy. - POCT INR   Shanora Christensen Asad A. [facility] 10/26/2015 12:25 PM

## 2015-10-27 LAB — CUP PACEART REMOTE DEVICE CHECK
Battery Impedance: 110 Ohm
Battery Remaining Longevity: 140 mo
Battery Voltage: 2.79 V
Brady Statistic AP VP Percent: 5 %
Brady Statistic AP VS Percent: 92 %
Brady Statistic AS VP Percent: 0 %
Brady Statistic AS VS Percent: 2 %
Date Time Interrogation Session: 20161115130919
Implantable Lead Implant Date: 20080117
Implantable Lead Implant Date: 20080117
Implantable Lead Location: 753859
Implantable Lead Location: 753860
Implantable Lead Model: 5076
Implantable Lead Model: 5076
Lead Channel Impedance Value: 427 Ohm
Lead Channel Impedance Value: 601 Ohm
Lead Channel Pacing Threshold Amplitude: 0.5 V
Lead Channel Pacing Threshold Amplitude: 0.75 V
Lead Channel Pacing Threshold Pulse Width: 0.4 ms
Lead Channel Pacing Threshold Pulse Width: 0.4 ms
Lead Channel Sensing Intrinsic Amplitude: 8 mV
Lead Channel Setting Pacing Amplitude: 2 V
Lead Channel Setting Pacing Amplitude: 2.5 V
Lead Channel Setting Pacing Pulse Width: 0.4 ms
Lead Channel Setting Sensing Sensitivity: 2.8 mV

## 2015-11-08 ENCOUNTER — Other Ambulatory Visit: Payer: Self-pay | Admitting: Family Medicine

## 2015-11-08 DIAGNOSIS — F419 Anxiety disorder, unspecified: Secondary | ICD-10-CM

## 2015-11-08 NOTE — Telephone Encounter (Signed)
Patient is completely out of alprazolam. Please send to [facility] [address]. She stated that the pharmacy was suppose to have sent a request on last Friday.

## 2015-11-09 DIAGNOSIS — G4733 Obstructive sleep apnea (adult) (pediatric): Secondary | ICD-10-CM | POA: Diagnosis not present

## 2015-11-10 ENCOUNTER — Other Ambulatory Visit: Payer: Self-pay

## 2015-11-10 DIAGNOSIS — F419 Anxiety disorder, unspecified: Secondary | ICD-10-CM

## 2015-11-10 MED ORDER — ALPRAZOLAM 0.5 MG PO TABS: 0.5000 mg | ORAL_TABLET | Freq: Every day | ORAL | Status: DC | PRN

## 2015-11-10 NOTE — Telephone Encounter (Signed)
Prescription has been reprinted per Dr. Manuella Ghazi due to another provider sign original script by accident so this script has been reprinted

## 2015-11-11 ENCOUNTER — Ambulatory Visit (INDEPENDENT_AMBULATORY_CARE_PROVIDER_SITE_OTHER): Payer: Medicare Other

## 2015-11-11 ENCOUNTER — Ambulatory Visit: Payer: Medicare Other

## 2015-11-11 ENCOUNTER — Other Ambulatory Visit: Payer: Self-pay

## 2015-11-11 DIAGNOSIS — Z7901 Long term (current) use of anticoagulants: Secondary | ICD-10-CM | POA: Diagnosis not present

## 2015-11-11 DIAGNOSIS — IMO0001 Reserved for inherently not codable concepts without codable children: Secondary | ICD-10-CM

## 2015-11-11 DIAGNOSIS — E1165 Type 2 diabetes mellitus with hyperglycemia: Secondary | ICD-10-CM

## 2015-11-11 DIAGNOSIS — K219 Gastro-esophageal reflux disease without esophagitis: Secondary | ICD-10-CM

## 2015-11-11 LAB — POCT INR: INR: 2.6

## 2015-11-11 MED ORDER — ESOMEPRAZOLE MAGNESIUM 40 MG PO CPDR: 40.0000 mg | DELAYED_RELEASE_CAPSULE | Freq: Every day | ORAL | Status: DC

## 2015-11-11 MED ORDER — METFORMIN HCL ER 500 MG PO TB24: 500.0000 mg | ORAL_TABLET | Freq: Two times a day (BID) | ORAL | Status: DC

## 2015-11-11 NOTE — Telephone Encounter (Signed)
Medication has been refilled and sent to [facility] [address] 

## 2015-11-20 DIAGNOSIS — G4733 Obstructive sleep apnea (adult) (pediatric): Secondary | ICD-10-CM | POA: Diagnosis not present

## 2015-12-07 DIAGNOSIS — H401132 Primary open-angle glaucoma, bilateral, moderate stage: Secondary | ICD-10-CM | POA: Diagnosis not present

## 2015-12-08 ENCOUNTER — Encounter: Payer: Self-pay | Admitting: Podiatry

## 2015-12-08 ENCOUNTER — Ambulatory Visit (INDEPENDENT_AMBULATORY_CARE_PROVIDER_SITE_OTHER): Payer: Medicare Other | Admitting: Podiatry

## 2015-12-08 DIAGNOSIS — Q828 Other specified congenital malformations of skin: Secondary | ICD-10-CM | POA: Diagnosis not present

## 2015-12-08 DIAGNOSIS — M79676 Pain in unspecified toe(s): Secondary | ICD-10-CM | POA: Diagnosis not present

## 2015-12-08 DIAGNOSIS — B351 Tinea unguium: Secondary | ICD-10-CM | POA: Diagnosis not present

## 2015-12-08 DIAGNOSIS — E119 Type 2 diabetes mellitus without complications: Secondary | ICD-10-CM

## 2015-12-08 NOTE — Progress Notes (Signed)
She presents today with a chief complaint of painful elongated toenails bilaterally. She is also complaining of painful calluses.  Objective: Vital signs are stable she is alert and oriented 3 pulses are palpable. No erythema edema saline as drainage or odor no open wounds. Thick yellow dystrophic onychomycotic nails which are painful on palpation as well as debridement. She also has reactive hyperkeratotic porokeratotic lesions plantarly bilateral.  Assessment: Pain in limb segment onychomycosis and porokeratosis bilateral.  Plan: Debridement of toenails and calluses bilateral secondary to pain.

## 2015-12-10 ENCOUNTER — Ambulatory Visit (INDEPENDENT_AMBULATORY_CARE_PROVIDER_SITE_OTHER): Payer: Medicare Other | Admitting: Cardiovascular Disease

## 2015-12-10 ENCOUNTER — Encounter: Payer: Self-pay | Admitting: Cardiovascular Disease

## 2015-12-10 ENCOUNTER — Other Ambulatory Visit: Payer: Self-pay | Admitting: Family Medicine

## 2015-12-10 VITALS — BP 124/70 | HR 79 | Ht 65.0 in | Wt 249.2 lb

## 2015-12-10 DIAGNOSIS — G4733 Obstructive sleep apnea (adult) (pediatric): Secondary | ICD-10-CM | POA: Diagnosis not present

## 2015-12-10 DIAGNOSIS — I5032 Chronic diastolic (congestive) heart failure: Secondary | ICD-10-CM

## 2015-12-10 DIAGNOSIS — R000 Tachycardia, unspecified: Secondary | ICD-10-CM | POA: Diagnosis not present

## 2015-12-10 DIAGNOSIS — Z950 Presence of cardiac pacemaker: Secondary | ICD-10-CM

## 2015-12-10 DIAGNOSIS — I493 Ventricular premature depolarization: Secondary | ICD-10-CM

## 2015-12-10 DIAGNOSIS — I10 Essential (primary) hypertension: Secondary | ICD-10-CM

## 2015-12-10 DIAGNOSIS — R0602 Shortness of breath: Secondary | ICD-10-CM | POA: Diagnosis not present

## 2015-12-10 DIAGNOSIS — I495 Sick sinus syndrome: Secondary | ICD-10-CM

## 2015-12-10 DIAGNOSIS — I480 Paroxysmal atrial fibrillation: Secondary | ICD-10-CM

## 2015-12-10 DIAGNOSIS — E785 Hyperlipidemia, unspecified: Secondary | ICD-10-CM

## 2015-12-10 MED ORDER — DILTIAZEM HCL 30 MG PO TABS: 30.0000 mg | ORAL_TABLET | Freq: Four times a day (QID) | ORAL | Status: DC | PRN

## 2015-12-10 NOTE — Progress Notes (Signed)
Patient ID: [name] [name], female    DOB: July 22, 1935, 81 y.o.   MRN: [id number]  HPI Comments: [name] [name] is a 81 yo woman with a history of atrial fibrillation, obesity, symptomatic palpitations/PVCs, status post pacemaker implantation for sick sinus syndrome/tachybradycardia syndrome with chronic symptoms of palpitations, shortness of breath with exertion, who presents for routine followup of her atrial fibrillation and shortness of breath History of chronic fatigue  In follow-up today, she reports that he was having nocturnal tachycardia at typically presented at 2 or 3 in the morning Described as a rapid palpitation. She would have to get out of bed, sit in a recliner until symptoms resolved. She now has a new CPAP machine, feels that this has helped her symptoms Having less tachycardia Also change made to her pacemaker, lower rate decreased to 60  Prior 30 day monitor reviewed with her in detail, unable to exclude periods of atrial fibrillation  No regular exercise. Legs feel weaker She is scheduled for colonoscopy with Dr. Allen Norris. She is nervous about coming off her Coumadin. Nervous about heart failure  EKG on today's visit shows normal sinus rhythm with AV paced rhythm  Other past medical history Recent Pacer changes 10/15, felt better Takes bystolic 20 BID Wears compressions on a regular basis  Previously on HCTZ, had abd cramps, ABD pains (now stopped) Finger fungus, on lamacil daily  June 2013 total cholesterol 155, LDL 82, HDL 56  Other past medical history Previous hospital admission on 09/24/2013 for acute on chronic diastolic CHF. She was drinking a significant amount of fluids. She was treated with IV diuretics with improvement of her symptoms. Was recommended that she go home with Lasix every other day. She had a stress test while in the hospital showing no ischemia. Echocardiogram dated 09/24/2013 showing ejection fraction 50-55%,, mild to moderate tricuspid regurg,  mild to moderate MR, normal RV function, normal right ventricular systolic pressure   ER again on 10/18/2013.   Previous hx of shortness of breath and palpitations.  May 2012, she had adjustments to her pacemaker and since that time has felt back to normal.   she has had long hx of hot flashes, and a racing heart rate when she is in bed. Recently this has been well-controlled on high-dose bystolic.   chronic back and hip pain. She did receive a cortisone shot on the left with no significant improvement. She reports that her sugars have been well-controlled, blood pressure well controlled on current medications   syncope episode August 2013 .  in her bedroom folding clothes and the next thing she knew she found herself on the floor in the bathroom. She spent 2 days in the hospital, then at least 10 days in rehabilitation for significant left arm bruising and she was on warfarin. Pacemaker was interrogated after the fact showing 85% atrial paced rhythm, no other arrhythmia. Etiology concerning for vasovagal episode, though exact cause is uncertain.  She has chronic buzzing in her ear. She does report this is a chronic issue going back many years. Prior workup with ear nose throat. She does have prior exposure to loud noises, worked in a [employer].   cardiac catheterization in 2004, stress testing,   echo  showed normal systolic function, mild LVH, diastolic relaxation abnormality, mildly elevated right ventricular systolic pressures, mild MR.  treadmill study showing normal blood pressure response to exercise, chronotropic incompetence with peak heart rate of 100 beats per minute, resting heart rate in the 70s. Previous  PFTs  demonstrated extrinsic compression/restrictive physiology.  Allergies  Allergen Reactions  . Levofloxacin In D5w Nausea Only and Nausea And Vomiting    Outpatient Encounter Prescriptions as of 12/10/2015  Medication Sig  . ALPRAZolam (XANAX) 0.5 MG tablet Take 1 tablet  (0.5 mg total) by mouth daily as needed.  . amLODipine (NORVASC) 10 MG tablet Take 1 tablet (10 mg total) by mouth daily.  . atorvastatin (LIPITOR) 20 MG tablet Take 1 tablet (20 mg total) by mouth daily.  . Blood Glucose Monitoring Suppl (ACCU-CHEK AVIVA PLUS) W/DEVICE KIT daily.   . Brinzolamide-Brimonidine (SIMBRINZA) 1-0.2 % SUSP Place 1 drop into both eyes 2 (two) times daily.  . esomeprazole (NEXIUM) 40 MG capsule Take 1 capsule (40 mg total) by mouth daily.  . fluticasone (FLONASE) 50 MCG/ACT nasal spray Place 2 sprays into both nostrils daily.  . furosemide (LASIX) 20 MG tablet Take 20 mg by mouth daily.  . glipiZIDE (GLUCOTROL) 10 MG tablet Take 1 tablet (10 mg total) by mouth 2 (two) times daily before a meal.  . glucose blood (ACCU-CHEK AVIVA PLUS) test strip Test Twice daily  . metFORMIN (GLUCOPHAGE-XR) 500 MG 24 hr tablet Take 1 tablet (500 mg total) by mouth 2 (two) times daily.  . MICROLET LANCETS MISC Use as directed to check BG QD  . mometasone (ELOCON) 0.1 % cream Apply topically daily. Apply to ear as directed two times a day for 10 days as needed     . Multiple Vitamins-Minerals (CENTRUM SILVER PO) Take by mouth daily.  . Nebivolol HCl (BYSTOLIC) 20 MG TABS Take 1 tablet (20 mg total) by mouth 2 (two) times daily.  . olmesartan (BENICAR) 40 MG tablet Take 1 tablet (40 mg total) by mouth daily.  . Omega-3 Fatty Acids (FISH OIL) 1200 MG CAPS Take 1,200 mg by mouth daily.  . TRAVATAN Z 0.004 % SOLN ophthalmic solution   . travoprost, benzalkonium, (TRAVATAN) 0.004 % ophthalmic solution Place 1 drop into both eyes at bedtime.   . warfarin (COUMADIN) 3 MG tablet TAKE 1-1.5 TABLETS DAILY AT 6 PM. TAKE 1 TAB ON TUES, THURS, SAT, SUN. TAKE 4.5 MG ON MON, WED, FRI  . diltiazem (CARDIZEM) 30 MG tablet Take 1 tablet (30 mg total) by mouth 4 (four) times daily as needed.  . [DISCONTINUED] benzonatate (TESSALON) 200 MG capsule Take 1 capsule (200 mg total) by mouth 3 (three) times daily  as needed for cough. (Patient not taking: Reported on 12/10/2015)  . [DISCONTINUED] colchicine 0.6 MG tablet Take 1 tablet (0.6 mg total) by mouth 2 (two) times daily. 1.2 mg po X 1, then 0.6 mg po1 hr later x 1. (Patient not taking: Reported on 12/10/2015)  . [DISCONTINUED] furosemide (LASIX) 20 MG tablet Take 20 mg by mouth daily as needed for fluid. Reported on 12/10/2015   No facility-administered encounter medications on file as of 12/10/2015.    Past Medical History  Diagnosis Date  . SSS (sick sinus syndrome) (HCC)     a. 2008 s/p MDT PPM;  b. 11/2013 Gen change- MDT ADDRL1 Adapta DC PPM, ser # [id number].  . PAF (paroxysmal atrial fibrillation) (HCC)   . Unspecified essential hypertension   . Esophageal reflux   . Unspecified glaucoma   . Morbid obesity (HCC)   . History of UTI   . Syncope and collapse     a. Felt to be vasovagal.  . Hyperlipidemia   . OSA on CPAP   . Type II diabetes mellitus (HCC)   .   Anemia   . H/O hiatal hernia   . Arthritis     a. shoulders.  . Anxiety   . Chronic diastolic heart failure (HCC)     a. 09/2013 Echo: EF 50-55%, mild LVH, mild to mod MR/TR.  . 1St degree AV block     a. PR 400 msec with Apacing  . Symptomatic PVCs   . Chest pain     a. 2004 Cath:  reportedly nl;  b. 09/2013 Neg MV.    Past Surgical History  Procedure Laterality Date  . Vaginal hysterectomy  1970  . Incision and drainage of wound Right 03/1998    "leg; it was like a boil" (12/01/2013)  . Insert / replace / remove pacemaker  2008; 12/01/2013    MDT ADDRL1 pacemaker - gen change by Dr Klein 12/01/2013  . Dilation and curettage of uterus    . Permanent pacemaker generator change N/A 12/01/2013    Procedure: PERMANENT PACEMAKER GENERATOR CHANGE;  Surgeon: Steven C Klein, MD;  Location: [facility] CATH LAB;  Service: Cardiovascular;  Laterality: N/A;  . Lung biopsy  2010    [facility]    Social History  reports that she quit smoking about 26 years ago. Her smoking use included  Cigarettes. She has a 17 pack-year smoking history. She has never used smokeless tobacco. She reports that she does not drink alcohol or use illicit drugs.  Family History family history includes Allergies in her father; Heart disease in her father; Liver cancer in her mother; Pancreatic cancer in her mother; Rheum arthritis in her father.  Review of Systems  Constitutional: Negative.   Eyes: Negative.   Respiratory: Negative.   Cardiovascular: Positive for palpitations.  Gastrointestinal: Negative.   Musculoskeletal: Positive for myalgias, back pain, arthralgias and gait problem.  Skin: Negative.   Neurological: Negative.   Hematological: Negative.   Psychiatric/Behavioral: Negative.   All other systems reviewed and are negative.   BP 124/70 mmHg  Pulse 79  Ht 5' 5" (1.651 m)  Wt 249 lb 4 oz (113.059 kg)  BMI 41.48 kg/m2  Physical Exam  Constitutional: She is oriented to person, place, and time. She appears well-developed and well-nourished.  Obese  HENT:  Head: Normocephalic.  Nose: Nose normal.  Mouth/Throat: Oropharynx is clear and moist.  Eyes: Conjunctivae are normal. Pupils are equal, round, and reactive to light.  Neck: Normal range of motion. Neck supple. No JVD present.  Cardiovascular: Normal rate, regular rhythm, S1 normal, S2 normal, normal heart sounds and intact distal pulses.  Exam reveals no gallop and no friction rub.   No murmur heard. Pulmonary/Chest: Effort normal and breath sounds normal. No respiratory distress. She has no wheezes. She has no rales. She exhibits no tenderness.  Abdominal: Soft. Bowel sounds are normal. She exhibits no distension. There is no tenderness.  Musculoskeletal: Normal range of motion. She exhibits no edema or tenderness.  Lymphadenopathy:    She has no cervical adenopathy.  Neurological: She is alert and oriented to person, place, and time. Coordination normal.  Skin: Skin is warm and dry. No rash noted. No erythema.   Psychiatric: She has a normal mood and affect. Her behavior is normal. Judgment and thought content normal.    Assessment and Plan  Nursing note and vitals reviewed.       

## 2015-12-10 NOTE — Telephone Encounter (Signed)
Amlodipine 10 mg has been refilled and sent to [facility] [address]

## 2015-12-10 NOTE — Assessment & Plan Note (Signed)
Encouraged her to stay on her Lipitor Cholesterol is at goal on the current lipid regimen. No changes to the medications were made.

## 2015-12-10 NOTE — Assessment & Plan Note (Signed)
Tolerating CPAP 

## 2015-12-10 NOTE — Assessment & Plan Note (Signed)
Currently on anticoagulation Unable to exclude paroxysmal atrial fibrillation on recent 30 day monitor.

## 2015-12-10 NOTE — Assessment & Plan Note (Signed)
She appears relatively euvolemic on today's visit. No medication changes made

## 2015-12-10 NOTE — Assessment & Plan Note (Signed)
30 day monitor did appreciate some PVCs

## 2015-12-10 NOTE — Assessment & Plan Note (Signed)
Followed by Dr. Caryl Comes Recommended that she take diltiazem 30 mg pills if she has symptomatic tachycardia overnight Unable to exclude short runs of atrial fibrillation on her 30 day monitor These did not seem to be picked up on pacemaker download on  Review she does not want medication on a daily basis for atrial fibrillation

## 2015-12-10 NOTE — Assessment & Plan Note (Signed)
Followed by Dr. Klein 

## 2015-12-10 NOTE — Assessment & Plan Note (Signed)
Blood pressure is well controlled on today's visit. No changes made to the medications. 

## 2015-12-10 NOTE — Patient Instructions (Signed)
You are doing well.  For tachycardia at night, Please take a diltiazem pill 1 to 2 pills as needed  Please call us if you have new issues that need to be addressed before your next appt.  Your physician wants you to follow-up in: 6 months.  You will receive a reminder letter in the mail two months in advance. If you don't receive a letter, please call our office to schedule the follow-up appointment.

## 2015-12-16 ENCOUNTER — Ambulatory Visit (INDEPENDENT_AMBULATORY_CARE_PROVIDER_SITE_OTHER): Payer: Medicare Other | Admitting: Family Medicine

## 2015-12-16 ENCOUNTER — Encounter: Payer: Self-pay | Admitting: Family Medicine

## 2015-12-16 VITALS — BP 122/73 | HR 78 | Temp 98.6°F | Resp 19 | Ht 65.0 in | Wt 248.2 lb

## 2015-12-16 DIAGNOSIS — M10471 Other secondary gout, right ankle and foot: Secondary | ICD-10-CM

## 2015-12-16 DIAGNOSIS — N183 Chronic kidney disease, stage 3 (moderate): Secondary | ICD-10-CM | POA: Diagnosis not present

## 2015-12-16 DIAGNOSIS — E1165 Type 2 diabetes mellitus with hyperglycemia: Secondary | ICD-10-CM | POA: Diagnosis not present

## 2015-12-16 DIAGNOSIS — I10 Essential (primary) hypertension: Secondary | ICD-10-CM | POA: Diagnosis not present

## 2015-12-16 DIAGNOSIS — E1122 Type 2 diabetes mellitus with diabetic chronic kidney disease: Secondary | ICD-10-CM | POA: Diagnosis not present

## 2015-12-16 DIAGNOSIS — M1A4710 Other secondary chronic gout, right ankle and foot, without tophus (tophi): Secondary | ICD-10-CM

## 2015-12-16 DIAGNOSIS — IMO0002 Reserved for concepts with insufficient information to code with codable children: Secondary | ICD-10-CM

## 2015-12-16 DIAGNOSIS — Z8739 Personal history of other diseases of the musculoskeletal system and connective tissue: Secondary | ICD-10-CM | POA: Insufficient documentation

## 2015-12-16 DIAGNOSIS — E785 Hyperlipidemia, unspecified: Secondary | ICD-10-CM

## 2015-12-16 DIAGNOSIS — M1A0710 Idiopathic chronic gout, right ankle and foot, without tophus (tophi): Secondary | ICD-10-CM | POA: Insufficient documentation

## 2015-12-16 LAB — POCT GLYCOSYLATED HEMOGLOBIN (HGB A1C): Hemoglobin A1C: 7.8

## 2015-12-16 MED ORDER — GLUCOSE BLOOD VI STRP: ORAL_STRIP | Status: DC

## 2015-12-16 MED ORDER — OLMESARTAN MEDOXOMIL 40 MG PO TABS: 40.0000 mg | ORAL_TABLET | Freq: Every day | ORAL | Status: DC

## 2015-12-16 MED ORDER — AMLODIPINE BESYLATE 10 MG PO TABS: 10.0000 mg | ORAL_TABLET | Freq: Every day | ORAL | Status: DC

## 2015-12-16 MED ORDER — COLCHICINE 0.6 MG PO TABS: 0.6000 mg | ORAL_TABLET | Freq: Every day | ORAL | Status: DC

## 2015-12-16 MED ORDER — ATORVASTATIN CALCIUM 20 MG PO TABS: 20.0000 mg | ORAL_TABLET | Freq: Every day | ORAL | Status: DC

## 2015-12-16 NOTE — Progress Notes (Signed)
Name: [name] [name]   MRN: [id number]    DOB: [birth year]   Date:12/16/2015       Progress Note  Subjective  Chief Complaint  Chief Complaint  Patient presents with  . Follow-up    3 mo  . Hyperlipidemia  . Hypertension  . Medication Refill    travoprost eye drops, / amlodipine 10 mg / colchicine 0.27m / benicar 40 mg / atorvastatin 20 mg    Hyperlipidemia This is a chronic problem. The problem is controlled. Recent lipid tests were reviewed and are normal. Exacerbating diseases include obesity. Pertinent negatives include no chest pain, leg pain, myalgias or shortness of breath. Current antihyperlipidemic treatment includes statins.  Hypertension This is a chronic problem. The problem is controlled. Associated symptoms include palpitations (episodes of rapid heart beat, recently started on Diltiazem by Cardiology.). Pertinent negatives include no blurred vision, chest pain, headaches or shortness of breath. Past treatments include calcium channel blockers and angiotensin blockers. The current treatment provides significant improvement. Hypertensive end-organ damage includes kidney disease. There is no history of CAD/MI or CVA.  Diabetes She presents for her follow-up diabetic visit. She has type 2 diabetes mellitus. Her disease course has been stable. Pertinent negatives for hypoglycemia include no headaches. Pertinent negatives for diabetes include no blurred vision, no chest pain, no fatigue, no polydipsia and no polyuria. Symptoms are stable. Pertinent negatives for diabetic complications include no CVA. Current diabetic treatment includes oral agent (dual therapy). She is following a diabetic diet. She monitors blood glucose at home 1-2 x per day. Her breakfast blood glucose range is generally 110-130 mg/dl. An ACE inhibitor/angiotensin II receptor blocker is being taken.   Gout Attack Started with soreness and swelling in right foot, similar to prior attacks. Started taking Colchicine 0.6  mg twow tablets followed by another tablet an hour later, which resulted in resolution of symptoms.   Past Medical History  Diagnosis Date  . SSS (sick sinus syndrome) ([facility]     a. 2008 s/p MDT PPM;  b. 11/2013 Gen change- MDT ADDRL1 Adapta DC PPM, ser # [id number].  .Marland KitchenPAF (paroxysmal atrial fibrillation) ([facility]   . Unspecified essential hypertension   . Esophageal reflux   . Unspecified glaucoma   . Morbid obesity ([facility]   . History of UTI   . Syncope and collapse     a. Felt to be vasovagal.  . Hyperlipidemia   . OSA on CPAP   . Type II diabetes mellitus ([facility]   . Anemia   . H/O hiatal hernia   . Arthritis     a. shoulders.  . Anxiety   . Chronic diastolic heart failure ([facility]     a. 09/2013 Echo: EF 50-55%, mild LVH, mild to mod MR/TR.  . 1St degree AV block     a. PR 400 msec with Apacing  . Symptomatic PVCs   . Chest pain     a. 2004 Cath:  reportedly nl;  b. 09/2013 Neg MV.    Past Surgical History  Procedure Laterality Date  . Vaginal hysterectomy  1970  . Incision and drainage of wound Right 03/1998    "leg; it was like a boil" (12/01/2013)  . Insert / replace / remove pacemaker  2008; 12/01/2013    MDT ADDRL1 pacemaker - gen change by Dr KCaryl Comes12/29/2014  . Dilation and curettage of uterus    . Permanent pacemaker generator change N/A 12/01/2013    Procedure: PERMANENT PACEMAKER GENERATOR CHANGE;  Surgeon: SRevonda Standard  Caryl Comes, MD;  Location: [facility] CATH LAB;  Service: Cardiovascular;  Laterality: N/A;  . Lung biopsy  2010    [facility]    Family History  Problem Relation Age of Onset  . Liver cancer Mother   . Pancreatic cancer Mother   . Heart disease Father   . Rheum arthritis Father   . Allergies Father     Social History   Social History  . Marital Status: Widowed    Spouse Name: N/A  . Number of Children: Y  . Years of Education: N/A   Occupational History  . retired    Social History Main Topics  . Smoking status: Former Smoker -- 0.50 packs/day for 34  years    Types: Cigarettes    Quit date: 08/15/1989  . Smokeless tobacco: Never Used  . Alcohol Use: No  . Drug Use: No  . Sexual Activity: No   Other Topics Concern  . Not on file   Social History Narrative   Retired. Widowed. Regularly exercises.      Current outpatient prescriptions:  .  ALPRAZolam (XANAX) 0.5 MG tablet, Take 1 tablet (0.5 mg total) by mouth daily as needed., Disp: 90 tablet, Rfl: 0 .  amLODipine (NORVASC) 10 MG tablet, Take 1 tablet (10 mg total) by mouth daily., Disp: 90 tablet, Rfl: 0 .  atorvastatin (LIPITOR) 20 MG tablet, Take 1 tablet (20 mg total) by mouth daily., Disp: 90 tablet, Rfl: 0 .  Blood Glucose Monitoring Suppl (ACCU-CHEK AVIVA PLUS) W/DEVICE KIT, daily. , Disp: , Rfl:  .  Brinzolamide-Brimonidine (SIMBRINZA) 1-0.2 % SUSP, Place 1 drop into both eyes 2 (two) times daily., Disp: 1 Bottle, Rfl: 1 .  diltiazem (CARDIZEM) 30 MG tablet, Take 1 tablet (30 mg total) by mouth 4 (four) times daily as needed., Disp: 90 tablet, Rfl: 3 .  esomeprazole (NEXIUM) 40 MG capsule, Take 1 capsule (40 mg total) by mouth daily., Disp: 90 capsule, Rfl: 0 .  fluticasone (FLONASE) 50 MCG/ACT nasal spray, Place 2 sprays into both nostrils daily., Disp: 16 g, Rfl: 0 .  furosemide (LASIX) 20 MG tablet, Take 20 mg by mouth daily., Disp: , Rfl:  .  glipiZIDE (GLUCOTROL) 10 MG tablet, Take 1 tablet (10 mg total) by mouth 2 (two) times daily before a meal., Disp: 135 tablet, Rfl: 0 .  glucose blood (ACCU-CHEK AVIVA PLUS) test strip, Test Twice daily, Disp: 200 each, Rfl: 0 .  metFORMIN (GLUCOPHAGE-XR) 500 MG 24 hr tablet, Take 1 tablet (500 mg total) by mouth 2 (two) times daily., Disp: 180 tablet, Rfl: 0 .  MICROLET LANCETS MISC, Use as directed to check BG QD, Disp: 60 each, Rfl: 2 .  mometasone (ELOCON) 0.1 % cream, Apply topically daily. Apply to ear as directed two times a day for 10 days as needed   , Disp: , Rfl:  .  Multiple Vitamins-Minerals (CENTRUM SILVER PO), Take by  mouth daily., Disp: , Rfl:  .  Nebivolol HCl (BYSTOLIC) 20 MG TABS, Take 1 tablet (20 mg total) by mouth 2 (two) times daily., Disp: 180 tablet, Rfl: 3 .  olmesartan (BENICAR) 40 MG tablet, Take 1 tablet (40 mg total) by mouth daily., Disp: 90 tablet, Rfl: 0 .  Omega-3 Fatty Acids (FISH OIL) 1200 MG CAPS, Take 1,200 mg by mouth daily., Disp: , Rfl:  .  TRAVATAN Z 0.004 % SOLN ophthalmic solution, , Disp: , Rfl:  .  travoprost, benzalkonium, (TRAVATAN) 0.004 % ophthalmic solution, Place 1 drop into both  eyes at bedtime. , Disp: , Rfl:  .  warfarin (COUMADIN) 3 MG tablet, TAKE 1-1.5 TABLETS DAILY AT 6 PM. TAKE 1 TAB ON TUES, THURS, SAT, SUN. TAKE 4.5 MG ON MON, WED, FRI, Disp: 100 tablet, Rfl: 3  Allergies  Allergen Reactions  . Levofloxacin In D5w Nausea Only and Nausea And Vomiting    Review of Systems  Constitutional: Negative for fatigue.  Eyes: Negative for blurred vision.  Respiratory: Negative for shortness of breath.   Cardiovascular: Positive for palpitations (episodes of rapid heart beat, recently started on Diltiazem by Cardiology.). Negative for chest pain.  Musculoskeletal: Negative for myalgias.  Neurological: Negative for headaches.  Endo/Heme/Allergies: Negative for polydipsia.    Objective  Filed Vitals:   12/16/15 0936  BP: 122/73  Pulse: 78  Temp: 98.6 F (37 C)  TempSrc: Oral  Resp: 19  Height: '5\' 5"'  (1.651 m)  Weight: 248 lb 3.2 oz (112.583 kg)  SpO2: 96%    Physical Exam  Constitutional: She is oriented to person, place, and time and well-developed, well-nourished, and in no distress.  HENT:  Head: Normocephalic and atraumatic.  Cardiovascular: Normal rate, regular rhythm and normal heart sounds.   Pulmonary/Chest: Effort normal and breath sounds normal.  Abdominal: Soft. Bowel sounds are normal.  Musculoskeletal: She exhibits edema (1+ pitting edema LLE, ).  Neurological: She is alert and oriented to person, place, and time.  Nursing note and vitals  reviewed.    Assessment & Plan  1. Hyperlipidemia A tgoal on Tx. - Lipid Profile - Comprehensive Metabolic Panel (CMET) - atorvastatin (LIPITOR) 20 MG tablet; Take 1 tablet (20 mg total) by mouth daily.  Dispense: 90 tablet; Refill: 0  2. Essential hypertension BP at goal on Tx. - olmesartan (BENICAR) 40 MG tablet; Take 1 tablet (40 mg total) by mouth daily.  Dispense: 90 tablet; Refill: 0 - amLODipine (NORVASC) 10 MG tablet; Take 1 tablet (10 mg total) by mouth daily.  Dispense: 90 tablet; Refill: 0  3. Uncontrolled type 2 diabetes mellitus with stage 3 chronic kidney disease, without long-term current use of insulin (HCC) Recheck A1c and consider medication adjustment. - POCT HgB A1C - POCT Glucose (CBG) - glucose blood (ACCU-CHEK AVIVA PLUS) test strip; Test Twice daily  Dispense: 200 each; Refill: 0 - Urine Microalbumin w/creat. ratio  4. Other secondary chronic gout of right foot Resolved. Will refill Colchicine for use in case of another attack. - colchicine 0.6 MG tablet; Take 1 tablet (0.6 mg total) by mouth daily. Take 2 tabs po X 1, followed by 1 tab 1 hr later.  Dispense: 10 tablet; Refill: 0   Mairyn Lenahan [facility] 12/16/2015 10:01 AM

## 2015-12-17 LAB — COMPREHENSIVE METABOLIC PANEL
ALT: 23 IU/L (ref 0–32)
AST: 24 IU/L (ref 0–40)
Albumin/Globulin Ratio: 1.4 (ref 1.1–2.5)
Albumin: 4.2 g/dL (ref 3.5–4.8)
Alkaline Phosphatase: 101 IU/L (ref 39–117)
BUN/Creatinine Ratio: 12 (ref 11–26)
BUN: 14 mg/dL (ref 8–27)
Bilirubin Total: 0.4 mg/dL (ref 0.0–1.2)
CO2: 23 mmol/L (ref 18–29)
Calcium: 9.8 mg/dL (ref 8.7–10.3)
Chloride: 103 mmol/L (ref 96–106)
Creatinine, Ser: 1.18 mg/dL — ABNORMAL HIGH (ref 0.57–1.00)
GFR calc Af Amer: 51 mL/min/{1.73_m2} — ABNORMAL LOW (ref >59)
GFR calc non Af Amer: 44 mL/min/{1.73_m2} — ABNORMAL LOW (ref >59)
Globulin, Total: 3.1 g/dL (ref 1.5–4.5)
Glucose: 152 mg/dL — ABNORMAL HIGH (ref 65–99)
Potassium: 4.3 mmol/L (ref 3.5–5.2)
Sodium: 144 mmol/L (ref 134–144)
Total Protein: 7.3 g/dL (ref 6.0–8.5)

## 2015-12-17 LAB — LIPID PANEL
Chol/HDL Ratio: 2.5 ratio (ref 0.0–4.4)
Cholesterol, Total: 153 mg/dL (ref 100–199)
HDL: 61 mg/dL
LDL Calculated: 72 mg/dL (ref 0–99)
Triglycerides: 100 mg/dL (ref 0–149)
VLDL Cholesterol Cal: 20 mg/dL (ref 5–40)

## 2015-12-17 LAB — MICROALBUMIN / CREATININE URINE RATIO
Creatinine, Urine: 32.9 mg/dL
MICROALB/CREAT RATIO: 27.7 mg/g creat (ref 0.0–30.0)
Microalbumin, Urine: 9.1 ug/mL

## 2015-12-31 ENCOUNTER — Telehealth: Payer: Self-pay

## 2015-12-31 MED ORDER — GLIPIZIDE 10 MG PO TABS: 10.0000 mg | ORAL_TABLET | Freq: Two times a day (BID) | ORAL | Status: DC

## 2015-12-31 NOTE — Telephone Encounter (Signed)
Medication has been refilled and sent to [facility] [address] 

## 2016-01-10 DIAGNOSIS — G4733 Obstructive sleep apnea (adult) (pediatric): Secondary | ICD-10-CM | POA: Diagnosis not present

## 2016-01-12 ENCOUNTER — Ambulatory Visit (INDEPENDENT_AMBULATORY_CARE_PROVIDER_SITE_OTHER): Payer: Medicare Other

## 2016-01-12 DIAGNOSIS — Z7901 Long term (current) use of anticoagulants: Secondary | ICD-10-CM

## 2016-01-12 LAB — POCT INR
INR: 2.5
INR: 2.5

## 2016-01-13 DIAGNOSIS — I495 Sick sinus syndrome: Secondary | ICD-10-CM | POA: Diagnosis not present

## 2016-01-13 DIAGNOSIS — I480 Paroxysmal atrial fibrillation: Secondary | ICD-10-CM | POA: Diagnosis not present

## 2016-01-13 DIAGNOSIS — I38 Endocarditis, valve unspecified: Secondary | ICD-10-CM | POA: Diagnosis not present

## 2016-01-13 DIAGNOSIS — I10 Essential (primary) hypertension: Secondary | ICD-10-CM | POA: Diagnosis not present

## 2016-01-13 DIAGNOSIS — I5022 Chronic systolic (congestive) heart failure: Secondary | ICD-10-CM | POA: Diagnosis not present

## 2016-01-27 ENCOUNTER — Telehealth: Payer: Self-pay | Admitting: Family Medicine

## 2016-01-27 DIAGNOSIS — R600 Localized edema: Secondary | ICD-10-CM | POA: Diagnosis not present

## 2016-01-27 DIAGNOSIS — D631 Anemia in chronic kidney disease: Secondary | ICD-10-CM | POA: Diagnosis not present

## 2016-01-27 DIAGNOSIS — N183 Chronic kidney disease, stage 3 (moderate): Secondary | ICD-10-CM | POA: Diagnosis not present

## 2016-01-27 DIAGNOSIS — I10 Essential (primary) hypertension: Secondary | ICD-10-CM | POA: Diagnosis not present

## 2016-01-27 MED ORDER — FUROSEMIDE 20 MG PO TABS: 20.0000 mg | ORAL_TABLET | Freq: Every day | ORAL | Status: DC

## 2016-01-27 NOTE — Telephone Encounter (Signed)
Pt needs refill on Lac[facility] to be sent to [facility]. Pt states she will be out tomorrow.

## 2016-01-27 NOTE — Telephone Encounter (Signed)
Medication has been refilled and sent to [facility] [address] 

## 2016-01-28 ENCOUNTER — Other Ambulatory Visit: Payer: Self-pay | Admitting: Cardiovascular Disease

## 2016-02-03 DIAGNOSIS — I495 Sick sinus syndrome: Secondary | ICD-10-CM | POA: Diagnosis not present

## 2016-02-03 DIAGNOSIS — I498 Other specified cardiac arrhythmias: Secondary | ICD-10-CM | POA: Diagnosis not present

## 2016-02-03 DIAGNOSIS — I480 Paroxysmal atrial fibrillation: Secondary | ICD-10-CM | POA: Diagnosis not present

## 2016-02-03 DIAGNOSIS — N183 Chronic kidney disease, stage 3 unspecified: Secondary | ICD-10-CM | POA: Insufficient documentation

## 2016-02-03 DIAGNOSIS — I440 Atrioventricular block, first degree: Secondary | ICD-10-CM | POA: Diagnosis not present

## 2016-02-03 DIAGNOSIS — N184 Chronic kidney disease, stage 4 (severe): Secondary | ICD-10-CM | POA: Insufficient documentation

## 2016-02-03 DIAGNOSIS — I5022 Chronic systolic (congestive) heart failure: Secondary | ICD-10-CM | POA: Diagnosis not present

## 2016-02-03 DIAGNOSIS — N185 Chronic kidney disease, stage 5: Secondary | ICD-10-CM | POA: Insufficient documentation

## 2016-02-16 ENCOUNTER — Telehealth: Payer: Self-pay

## 2016-02-16 DIAGNOSIS — IMO0001 Reserved for inherently not codable concepts without codable children: Secondary | ICD-10-CM

## 2016-02-16 DIAGNOSIS — K219 Gastro-esophageal reflux disease without esophagitis: Secondary | ICD-10-CM

## 2016-02-16 DIAGNOSIS — E1165 Type 2 diabetes mellitus with hyperglycemia: Principal | ICD-10-CM

## 2016-02-16 MED ORDER — METFORMIN HCL ER 500 MG PO TB24: 500.0000 mg | ORAL_TABLET | Freq: Two times a day (BID) | ORAL | Status: DC

## 2016-02-16 MED ORDER — ESOMEPRAZOLE MAGNESIUM 40 MG PO CPDR: 40.0000 mg | DELAYED_RELEASE_CAPSULE | Freq: Every day | ORAL | Status: DC

## 2016-02-16 NOTE — Telephone Encounter (Signed)
Medication has been refilled and sent to [facility] [address] 

## 2016-03-08 ENCOUNTER — Ambulatory Visit: Payer: Medicare Other | Admitting: Podiatry

## 2016-03-12 ENCOUNTER — Other Ambulatory Visit: Payer: Self-pay | Admitting: Family Medicine

## 2016-03-15 ENCOUNTER — Other Ambulatory Visit: Payer: Self-pay | Admitting: Family Medicine

## 2016-03-15 ENCOUNTER — Ambulatory Visit (INDEPENDENT_AMBULATORY_CARE_PROVIDER_SITE_OTHER): Payer: Medicare Other | Admitting: Family Medicine

## 2016-03-15 ENCOUNTER — Encounter: Payer: Self-pay | Admitting: Family Medicine

## 2016-03-15 VITALS — BP 118/72 | HR 77 | Temp 97.6°F | Resp 16 | Ht 65.0 in | Wt 244.3 lb

## 2016-03-15 DIAGNOSIS — I10 Essential (primary) hypertension: Secondary | ICD-10-CM

## 2016-03-15 DIAGNOSIS — E785 Hyperlipidemia, unspecified: Secondary | ICD-10-CM

## 2016-03-15 DIAGNOSIS — M10071 Idiopathic gout, right ankle and foot: Secondary | ICD-10-CM

## 2016-03-15 DIAGNOSIS — IMO0002 Reserved for concepts with insufficient information to code with codable children: Secondary | ICD-10-CM

## 2016-03-15 DIAGNOSIS — N183 Chronic kidney disease, stage 3 (moderate): Secondary | ICD-10-CM | POA: Diagnosis not present

## 2016-03-15 DIAGNOSIS — E1122 Type 2 diabetes mellitus with diabetic chronic kidney disease: Secondary | ICD-10-CM

## 2016-03-15 DIAGNOSIS — Z7901 Long term (current) use of anticoagulants: Secondary | ICD-10-CM | POA: Diagnosis not present

## 2016-03-15 DIAGNOSIS — E1165 Type 2 diabetes mellitus with hyperglycemia: Secondary | ICD-10-CM

## 2016-03-15 DIAGNOSIS — M109 Gout, unspecified: Secondary | ICD-10-CM

## 2016-03-15 LAB — POCT INR
INR: 2.7
Protime: 33 seconds

## 2016-03-15 LAB — POCT GLYCOSYLATED HEMOGLOBIN (HGB A1C): Hemoglobin A1C: 8.2

## 2016-03-15 MED ORDER — LINAGLIPTIN 5 MG PO TABS: 5.0000 mg | ORAL_TABLET | Freq: Every day | ORAL | Status: DC

## 2016-03-15 NOTE — Progress Notes (Signed)
Name: [name] [name]   MRN: [id number]    DOB: October 24, 1935   Date:03/15/2016       Progress Note  Subjective  Chief Complaint  Chief Complaint  Patient presents with  . Hypertension    essential. patient presents with no problems regarding her BP.   Marland Kitchen Hyperlipidemia    patient is here for her 40-monthf/u.  . Diabetes    patient checks her BS regularly. highest 240 (on 02/12/16) & the lowest 112.  . Gout    patient stated that she had a flare up late monday night and restarted her meds early Tuesday morning.    Hypertension This is a chronic problem. The problem is controlled. Pertinent negatives include no chest pain, headaches, palpitations (improved since started on Coreg and discontinued Bystolic. Now seeing Dr. FUbaldo Glassing or shortness of breath. Past treatments include beta blockers, angiotensin blockers and calcium channel blockers. Hypertensive end-organ damage includes kidney disease.  Hyperlipidemia This is a chronic problem. The problem is controlled. Pertinent negatives include no chest pain or shortness of breath. Current antihyperlipidemic treatment includes statins.  Diabetes She presents for her follow-up diabetic visit. She has type 2 diabetes mellitus. Her disease course has been worsening. Pertinent negatives for hypoglycemia include no headaches. Pertinent negatives for diabetes include no chest pain. Symptoms are worsening. Risk factors for coronary artery disease include diabetes mellitus, dyslipidemia and obesity. Current diabetic treatment includes oral agent (dual therapy). Her weight is stable. Her breakfast blood glucose range is generally 130-140 mg/dl.   Gout: Had a gout attack in right foot 2 days ago, started taking Colchicine (0.611mtwo tablets, followed by 1 tablet after an hour). Swelling has improved, pain is better.   Past Medical History  Diagnosis Date  . SSS (sick sinus syndrome) ([facility]    a. 2008 s/p MDT PPM;  b. 11/2013 Gen change- MDT ADDRL1 Adapta DC PPM,  ser # [id number].  . Marland KitchenAF (paroxysmal atrial fibrillation) ([facility]  . Unspecified essential hypertension   . Esophageal reflux   . Unspecified glaucoma   . Morbid obesity ([facility]  . History of UTI   . Syncope and collapse     a. Felt to be vasovagal.  . Hyperlipidemia   . OSA on CPAP   . Type II diabetes mellitus ([facility]  . Anemia   . H/O hiatal hernia   . Arthritis     a. shoulders.  . Anxiety   . Chronic diastolic heart failure ([facility]    a. 09/2013 Echo: EF 50-55%, mild LVH, mild to mod MR/TR.  . 1St degree AV block     a. PR 400 msec with Apacing  . Symptomatic PVCs   . Chest pain     a. 2004 Cath:  reportedly nl;  b. 09/2013 Neg MV.    Past Surgical History  Procedure Laterality Date  . Vaginal hysterectomy  1970  . Incision and drainage of wound Right 03/1998    "leg; it was like a boil" (12/01/2013)  . Insert / replace / remove pacemaker  2008; 12/01/2013    MDT ADDRL1 pacemaker - gen change by Dr KlCaryl Comes2/29/2014  . Dilation and curettage of uterus    . Permanent pacemaker generator change N/A 12/01/2013    Procedure: PERMANENT PACEMAKER GENERATOR CHANGE;  Surgeon: StDeboraha SprangMD;  [facility];  Service: Cardiovascular;  Laterality: N/A;  . Lung biopsy  2010    [facility]    Family History  Problem Relation Age of Onset  . Liver cancer Mother   . Pancreatic cancer Mother   . Heart disease Father   . Rheum arthritis Father   . Allergies Father     Social History   Social History  . Marital Status: Widowed    Spouse Name: N/A  . Number of Children: Y  . Years of Education: N/A   Occupational History  . retired    Social History Main Topics  . Smoking status: Former Smoker -- 0.50 packs/day for 34 years    Types: Cigarettes    Quit date: 08/15/1989  . Smokeless tobacco: Never Used  . Alcohol Use: No  . Drug Use: No  . Sexual Activity: No   Other Topics Concern  . Not on file   Social History Narrative   Retired. Widowed. Regularly  exercises.      Current outpatient prescriptions:  .  triamcinolone (KENALOG) 0.025 % cream, , Disp: , Rfl:  .  ACCU-CHEK AVIVA PLUS test strip, TEST TWICE DAILY, Disp: 180 each, Rfl: 3 .  ALPRAZolam (XANAX) 0.5 MG tablet, Take 1 tablet (0.5 mg total) by mouth daily as needed., Disp: 90 tablet, Rfl: 0 .  amLODipine (NORVASC) 10 MG tablet, TAKE 1 TABLET (10 MG TOTAL) BY MOUTH DAILY., Disp: 90 tablet, Rfl: 0 .  atorvastatin (LIPITOR) 20 MG tablet, TAKE 1 TABLET (20 MG TOTAL) BY MOUTH DAILY., Disp: 90 tablet, Rfl: 0 .  Blood Glucose Monitoring Suppl (ACCU-CHEK AVIVA PLUS) W/DEVICE KIT, daily. , Disp: , Rfl:  .  Brinzolamide-Brimonidine (SIMBRINZA) 1-0.2 % SUSP, Place 1 drop into both eyes 2 (two) times daily., Disp: 1 Bottle, Rfl: 1 .  carvedilol (COREG) 6.25 MG tablet, TAKE 1 TABLET (6.25 MG TOTAL) BY MOUTH 2 (TWO) TIMES DAILY WITH MEALS., Disp: , Rfl: 11 .  colchicine 0.6 MG tablet, Take 1 tablet (0.6 mg total) by mouth daily. Take 2 tabs po X 1, followed by 1 tab 1 hr later., Disp: 10 tablet, Rfl: 0 .  esomeprazole (NEXIUM) 40 MG capsule, Take 1 capsule (40 mg total) by mouth daily., Disp: 90 capsule, Rfl: 0 .  fluticasone (FLONASE) 50 MCG/ACT nasal spray, Place 2 sprays into both nostrils daily., Disp: 16 g, Rfl: 0 .  furosemide (LASIX) 20 MG tablet, Take 1 tablet (20 mg total) by mouth daily., Disp: 90 tablet, Rfl: 0 .  glipiZIDE (GLUCOTROL) 10 MG tablet, Take 1 tablet (10 mg total) by mouth 2 (two) times daily before a meal., Disp: 135 tablet, Rfl: 0 .  metFORMIN (GLUCOPHAGE-XR) 500 MG 24 hr tablet, Take 1 tablet (500 mg total) by mouth 2 (two) times daily., Disp: 180 tablet, Rfl: 0 .  MICROLET LANCETS MISC, Use as directed to check BG QD, Disp: 60 each, Rfl: 2 .  mometasone (ELOCON) 0.1 % cream, Apply topically daily. Apply to ear as directed two times a day for 10 days as needed   , Disp: , Rfl:  .  Multiple Vitamins-Minerals (CENTRUM SILVER PO), Take by mouth daily., Disp: , Rfl:  .   olmesartan (BENICAR) 40 MG tablet, TAKE 1 TABLET (40 MG TOTAL) BY MOUTH DAILY., Disp: 90 tablet, Rfl: 0 .  Omega-3 Fatty Acids (FISH OIL) 1200 MG CAPS, Take 1,200 mg by mouth daily., Disp: , Rfl:  .  TRAVATAN Z 0.004 % SOLN ophthalmic solution, , Disp: , Rfl:  .  travoprost, benzalkonium, (TRAVATAN) 0.004 % ophthalmic solution, Place 1 drop into both eyes at bedtime. , Disp: , Rfl:  .  warfarin (COUMADIN) 3 MG tablet, TAKE 1-1.5 TABLETS DAILY AT 6 PM. TAKE 1 TAB ON TUES, THURS, SAT, SUN. TAKE 4.5 MG ON MON, WED, FRI, Disp: 100 tablet, Rfl: 3  Allergies  Allergen Reactions  . Diltiazem Other (See Comments)    Patient stated that she had bad dreams with this medication  . Metoprolol Other (See Comments)    Patient stated that she had suicidal thoughts while taking this medication.  . Levofloxacin In D5w Nausea Only and Nausea And Vomiting     Review of Systems  Respiratory: Negative for shortness of breath.   Cardiovascular: Negative for chest pain and palpitations (improved since started on Coreg and discontinued Bystolic. Now seeing Dr. Ubaldo Glassing).  Neurological: Negative for headaches.    Objective  Filed Vitals:   03/15/16 1044  BP: 118/72  Pulse: 77  Temp: 97.6 F (36.4 C)  TempSrc: Oral  Resp: 16  Height: _0  (1.651 m)  Weight: 244 lb 4.8 oz (110.814 kg)  SpO2: 98%    Physical Exam  Constitutional: She is oriented to person, place, and time and well-developed, well-nourished, and in no distress.  HENT:  Head: Normocephalic and atraumatic.  Cardiovascular: Normal rate and regular rhythm.   Pulmonary/Chest: Effort normal and breath sounds normal.  Abdominal: Soft. Bowel sounds are normal.  Musculoskeletal:       Right foot: There is tenderness and swelling.       Feet:  Tenderness to palpation over the 5th toe right foot, minimal erythema, normal ROM.  Neurological: She is alert and oriented to person, place, and time.  Nursing note and vitals  reviewed.    Assessment & Plan  1. Uncontrolled type 2 diabetes mellitus with stage 3 chronic kidney disease, without long-term current use of insulin (HCC) A1c is 8.2%, diabetes is poorly controlled. We'll start on Tradjenta 5 mg daily continue on metformin and glipizide. - POCT glycosylated hemoglobin (Hb A1C) - Comprehensive Metabolic Panel (CMET) - linagliptin (TRADJENTA) 5 MG TABS tablet; Take 1 tablet (5 mg total) by mouth daily.  Dispense: 30 tablet; Refill: 2  2. Anticoagulation monitoring, INR range 2-3 INR is at goal. - POCT INR  3. Acute gout of right foot, unspecified cause Obtain uric acid levels. Symptoms improving, may benefit from allopurinol - Uric acid  4. Essential hypertension Blood pressure is better controlled, now on carvedilol started by cardiology. Continue on amlodipine and Benicar.  5. Hyperlipidemia  - Lipid Profile - Comprehensive Metabolic Panel (CMET)   Corrie Brannen [facility] 03/15/2016 11:34 AM

## 2016-03-16 LAB — LIPID PANEL
Chol/HDL Ratio: 2.9 ratio units (ref 0.0–4.4)
Cholesterol, Total: 144 mg/dL (ref 100–199)
HDL: 49 mg/dL (ref >39)
LDL Calculated: 67 mg/dL (ref 0–99)
Triglycerides: 138 mg/dL (ref 0–149)
VLDL Cholesterol Cal: 28 mg/dL (ref 5–40)

## 2016-03-16 LAB — COMPREHENSIVE METABOLIC PANEL
ALT: 30 IU/L (ref 0–32)
AST: 31 IU/L (ref 0–40)
Albumin/Globulin Ratio: 1.5 (ref 1.2–2.2)
Albumin: 4.2 g/dL (ref 3.5–4.8)
Alkaline Phosphatase: 99 IU/L (ref 39–117)
BUN/Creatinine Ratio: 13 (ref 12–28)
BUN: 15 mg/dL (ref 8–27)
Bilirubin Total: 0.4 mg/dL (ref 0.0–1.2)
CO2: 24 mmol/L (ref 18–29)
Calcium: 9.6 mg/dL (ref 8.7–10.3)
Chloride: 101 mmol/L (ref 96–106)
Creatinine, Ser: 1.18 mg/dL — ABNORMAL HIGH (ref 0.57–1.00)
GFR calc Af Amer: 51 mL/min/{1.73_m2} — ABNORMAL LOW (ref >59)
GFR calc non Af Amer: 44 mL/min/{1.73_m2} — ABNORMAL LOW (ref >59)
Globulin, Total: 2.8 g/dL (ref 1.5–4.5)
Glucose: 194 mg/dL — ABNORMAL HIGH (ref 65–99)
Potassium: 4.6 mmol/L (ref 3.5–5.2)
Sodium: 142 mmol/L (ref 134–144)
Total Protein: 7 g/dL (ref 6.0–8.5)

## 2016-03-16 LAB — URIC ACID: Uric Acid: 8.4 mg/dL — ABNORMAL HIGH (ref 2.5–7.1)

## 2016-03-20 ENCOUNTER — Other Ambulatory Visit: Payer: Self-pay | Admitting: Family Medicine

## 2016-03-23 ENCOUNTER — Telehealth: Payer: Self-pay

## 2016-03-23 ENCOUNTER — Encounter: Payer: Self-pay | Admitting: Family Medicine

## 2016-03-23 ENCOUNTER — Ambulatory Visit
Admission: RE | Admit: 2016-03-23 | Discharge: 2016-03-23 | Disposition: A | Discharge status: Home / Self Care | Payer: Medicare Other | Source: Ambulatory Visit | Attending: Family Medicine | Admitting: Family Medicine

## 2016-03-23 ENCOUNTER — Ambulatory Visit (INDEPENDENT_AMBULATORY_CARE_PROVIDER_SITE_OTHER): Payer: Medicare Other | Admitting: Family Medicine

## 2016-03-23 VITALS — BP 120/71 | HR 73 | Temp 98.6°F | Resp 16 | Ht 65.0 in | Wt 246.0 lb

## 2016-03-23 DIAGNOSIS — M25551 Pain in right hip: Secondary | ICD-10-CM | POA: Diagnosis not present

## 2016-03-23 DIAGNOSIS — L918 Other hypertrophic disorders of the skin: Secondary | ICD-10-CM

## 2016-03-23 DIAGNOSIS — J0100 Acute maxillary sinusitis, unspecified: Secondary | ICD-10-CM

## 2016-03-23 DIAGNOSIS — M160 Bilateral primary osteoarthritis of hip: Secondary | ICD-10-CM | POA: Insufficient documentation

## 2016-03-23 MED ORDER — MOMETASONE FUROATE 0.1 % EX CREA: TOPICAL_CREAM | Freq: Every day | CUTANEOUS | Status: DC

## 2016-03-23 MED ORDER — GLIPIZIDE 10 MG PO TABS: 10.0000 mg | ORAL_TABLET | Freq: Two times a day (BID) | ORAL | Status: DC

## 2016-03-23 MED ORDER — AZITHROMYCIN 250 MG PO TABS: ORAL_TABLET | ORAL | Status: DC

## 2016-03-23 NOTE — Progress Notes (Signed)
Name: [name] [name]   MRN: [id number]    DOB: September 24, 1935   Date:03/23/2016       Progress Note  Subjective  Chief Complaint  Chief Complaint  Patient presents with  . Follow-up    groin area    Sinusitis This is a new problem. The current episode started in the past 7 days (2 days ago). Associated symptoms include congestion, coughing and a sore throat. Pertinent negatives include no chills, ear pain, sinus pressure or sneezing. Past treatments include saline sprays (Has tried salt-water gargles.).  Hip Pain  The pain is present in the right thigh. The quality of the pain is described as aching. The pain is at a severity of 9/10. The pain is severe. The pain has been fluctuating since onset. The symptoms are aggravated by movement and weight bearing. She has tried non-weight bearing for the symptoms.   Patient also have a 'scab' on her navel that she wants me to look at. Its been there for over a month, its not painful, but sometimes gets red around the area. She has tried Elocon cream which gives her temporary relief.      Past Medical History  Diagnosis Date  . SSS (sick sinus syndrome) ([place])     a. 2008 s/p MDT PPM;  b. 11/2013 Gen change- MDT ADDRL1 Adapta DC PPM, ser # [id number] H.  Marland Kitchen PAF (paroxysmal atrial fibrillation) ([place])   . Unspecified essential hypertension   . Esophageal reflux   . Unspecified glaucoma   . Morbid obesity ([place])   . History of UTI   . Syncope and collapse     a. Felt to be vasovagal.  . Hyperlipidemia   . OSA on CPAP   . Type II diabetes mellitus ([place])   . Anemia   . H/O hiatal hernia   . Arthritis     a. shoulders.  . Anxiety   . Chronic diastolic heart failure ([place])     a. 09/2013 Echo: EF 50-55%, mild LVH, mild to mod MR/TR.  . 1St degree AV block     a. PR 400 msec with Apacing  . Symptomatic PVCs   . Chest pain     a. 2004 Cath:  reportedly nl;  b. 09/2013 Neg MV.    Past Surgical History  Procedure Laterality Date  . Vaginal  hysterectomy  1970  . Incision and drainage of wound Right 03/1998    "leg; it was like a boil" (12/01/2013)  . Insert / replace / remove pacemaker  2008; 12/01/2013    MDT ADDRL1 pacemaker - gen change by Dr Caryl Comes 12/01/2013  . Dilation and curettage of uterus    . Permanent pacemaker generator change N/A 12/01/2013    Procedure: PERMANENT PACEMAKER GENERATOR CHANGE;  Surgeon: Deboraha Sprang, MD;  [facility]: [facility] LAB;  Service: Cardiovascular;  Laterality: N/A;  . Lung biopsy  2010    [facility]    Family History  Problem Relation Age of Onset  . Liver cancer Mother   . Pancreatic cancer Mother   . Heart disease Father   . Rheum arthritis Father   . Allergies Father     Social History   Social History  . Marital Status: Widowed    Spouse Name: N/A  . Number of Children: Y  . Years of Education: N/A   Occupational History  . retired    Social History Main Topics  . Smoking status: Former Smoker -- 0.50 packs/day for 34 years  Types: Cigarettes    Quit date: 08/15/1989  . Smokeless tobacco: Never Used  . Alcohol Use: No  . Drug Use: No  . Sexual Activity: No   Other Topics Concern  . Not on file   Social History Narrative   Retired. Widowed. Regularly exercises.      Current outpatient prescriptions:  .  ACCU-CHEK AVIVA PLUS test strip, TEST TWICE DAILY, Disp: 180 each, Rfl: 3 .  ALPRAZolam (XANAX) 0.5 MG tablet, Take 1 tablet (0.5 mg total) by mouth daily as needed., Disp: 90 tablet, Rfl: 0 .  amLODipine (NORVASC) 10 MG tablet, TAKE 1 TABLET (10 MG TOTAL) BY MOUTH DAILY., Disp: 90 tablet, Rfl: 0 .  atorvastatin (LIPITOR) 20 MG tablet, TAKE 1 TABLET (20 MG TOTAL) BY MOUTH DAILY., Disp: 90 tablet, Rfl: 0 .  Blood Glucose Monitoring Suppl (ACCU-CHEK AVIVA PLUS) W/DEVICE KIT, daily. , Disp: , Rfl:  .  Brinzolamide-Brimonidine (SIMBRINZA) 1-0.2 % SUSP, Place 1 drop into both eyes 2 (two) times daily., Disp: 1 Bottle, Rfl: 1 .  carvedilol (COREG) 6.25 MG tablet, TAKE 1  TABLET (6.25 MG TOTAL) BY MOUTH 2 (TWO) TIMES DAILY WITH MEALS., Disp: , Rfl: 11 .  colchicine 0.6 MG tablet, Take 1 tablet (0.6 mg total) by mouth daily. Take 2 tabs po X 1, followed by 1 tab 1 hr later., Disp: 10 tablet, Rfl: 0 .  esomeprazole (NEXIUM) 40 MG capsule, Take 1 capsule (40 mg total) by mouth daily., Disp: 90 capsule, Rfl: 0 .  fluticasone (FLONASE) 50 MCG/ACT nasal spray, Place 2 sprays into both nostrils daily., Disp: 16 g, Rfl: 0 .  furosemide (LASIX) 20 MG tablet, Take 1 tablet (20 mg total) by mouth daily., Disp: 90 tablet, Rfl: 0 .  linagliptin (TRADJENTA) 5 MG TABS tablet, Take 1 tablet (5 mg total) by mouth daily., Disp: 30 tablet, Rfl: 2 .  metFORMIN (GLUCOPHAGE-XR) 500 MG 24 hr tablet, Take 1 tablet (500 mg total) by mouth 2 (two) times daily., Disp: 180 tablet, Rfl: 0 .  MICROLET LANCETS MISC, Use as directed to check BG QD, Disp: 60 each, Rfl: 2 .  Multiple Vitamins-Minerals (CENTRUM SILVER PO), Take by mouth daily., Disp: , Rfl:  .  olmesartan (BENICAR) 40 MG tablet, TAKE 1 TABLET (40 MG TOTAL) BY MOUTH DAILY., Disp: 90 tablet, Rfl: 0 .  TRAVATAN Z 0.004 % SOLN ophthalmic solution, , Disp: , Rfl:  .  travoprost, benzalkonium, (TRAVATAN) 0.004 % ophthalmic solution, Place 1 drop into both eyes at bedtime. , Disp: , Rfl:  .  triamcinolone (KENALOG) 0.025 % cream, , Disp: , Rfl:  .  warfarin (COUMADIN) 3 MG tablet, TAKE 1-1.5 TABLETS DAILY AT 6 PM. TAKE 1 TAB ON TUES, THURS, SAT, SUN. TAKE 4.5 MG ON MON, WED, FRI, Disp: 100 tablet, Rfl: 3 .  glipiZIDE (GLUCOTROL) 10 MG tablet, Take 1 tablet (10 mg total) by mouth 2 (two) times daily before a meal., Disp: 135 tablet, Rfl: 0 .  mometasone (ELOCON) 0.1 % cream, Apply topically daily. Apply to ear as directed two times a day for 10 days as needed, Disp: 45 g, Rfl: 0  Allergies  Allergen Reactions  . Diltiazem Other (See Comments)    Patient stated that she had bad dreams with this medication  . Metoprolol Other (See Comments)     Patient stated that she had suicidal thoughts while taking this medication.  . Levofloxacin In D5w Nausea Only and Nausea And Vomiting     Review of  Systems  Constitutional: Negative for fever and chills.  HENT: Positive for congestion and sore throat. Negative for ear pain, sinus pressure and sneezing.   Respiratory: Positive for cough.   Musculoskeletal: Positive for joint pain.  Skin: Negative for itching and rash.     Objective  Filed Vitals:   03/23/16 0955  BP: 120/71  Pulse: 73  Temp: 98.6 F (37 C)  TempSrc: Oral  Resp: 16  Height: _0  (1.651 m)  Weight: 246 lb (111.585 kg)  SpO2: 96%    Physical Exam  Constitutional: She is oriented to person, place, and time and well-developed, well-nourished, and in no distress.  HENT:  Head: Normocephalic and atraumatic.  Mouth/Throat: Mucous membranes are normal. Posterior oropharyngeal erythema present.  Erythematous, turbinates mildly hypertrophied, no mucus visible Mild pharyngeal erythema, no edema.  Cardiovascular: Normal rate, regular rhythm, S1 normal and S2 normal.   Pulmonary/Chest: Effort normal and breath sounds normal.  Musculoskeletal:       Right hip: She exhibits tenderness. She exhibits no swelling.       Legs: Neurological: She is alert and oriented to person, place, and time.  Skin: Lesion and rash noted. Rash is nodular.     Inside the navel on the right side is a soft nontender, non erythematous wart like lesion c/w skin tag  Nursing note and vitals reviewed.      Assessment & Plan  1. Acute maxillary sinusitis, recurrence not specified We will start on antibiotic therapy for acute sinusitis. - azithromycin (ZITHROMAX Z-PAK) 250 MG tablet; 2 tabs po day 1, then 1 tab po q day x 4 days  Dispense: 6 each; Refill: 0  2. Pain of right hip joint Suspect osteoarthritis, we'll obtain x-ray of the hip. - DG HIP UNILAT WITH PELVIS 2-3 VIEWS RIGHT; Future  3. Skin tag Located in the outer  right umbilical area, reassured no intervention needed at present.   Al Gagen [facility] 03/23/2016 10:07 AM

## 2016-03-23 NOTE — Telephone Encounter (Signed)
Medications have been refilled and sent to [facility] [address] [address]

## 2016-03-29 ENCOUNTER — Telehealth: Payer: Self-pay | Admitting: Family Medicine

## 2016-03-29 DIAGNOSIS — F419 Anxiety disorder, unspecified: Secondary | ICD-10-CM

## 2016-03-29 NOTE — Telephone Encounter (Signed)
Pt would like for you to call her back. °

## 2016-03-31 DIAGNOSIS — M25551 Pain in right hip: Secondary | ICD-10-CM | POA: Diagnosis not present

## 2016-04-03 NOTE — Telephone Encounter (Signed)
Routed to Dr. Manuella Ghazi for patient advice

## 2016-04-04 DIAGNOSIS — H40153 Residual stage of open-angle glaucoma, bilateral: Secondary | ICD-10-CM | POA: Diagnosis not present

## 2016-04-04 MED ORDER — ALPRAZOLAM 0.5 MG PO TABS: 0.5000 mg | ORAL_TABLET | Freq: Every day | ORAL | Status: DC | PRN

## 2016-04-04 NOTE — Telephone Encounter (Signed)
Prescription for alprazolam is ready to be called in.

## 2016-04-04 NOTE — Telephone Encounter (Signed)
Pt needs refill for alprazolam.

## 2016-05-10 ENCOUNTER — Other Ambulatory Visit: Payer: Self-pay | Admitting: Family Medicine

## 2016-05-10 ENCOUNTER — Ambulatory Visit (INDEPENDENT_AMBULATORY_CARE_PROVIDER_SITE_OTHER): Payer: Medicare Other

## 2016-05-10 DIAGNOSIS — D699 Hemorrhagic condition, unspecified: Secondary | ICD-10-CM | POA: Diagnosis not present

## 2016-05-10 LAB — POCT INR
INR: 2.5
INR: 2.5

## 2016-05-10 NOTE — Patient Instructions (Signed)
Patient is to remain on normal daily dosages and will return in one month

## 2016-05-11 ENCOUNTER — Telehealth: Payer: Self-pay | Admitting: Family Medicine

## 2016-05-11 DIAGNOSIS — I5032 Chronic diastolic (congestive) heart failure: Secondary | ICD-10-CM

## 2016-05-11 MED ORDER — FUROSEMIDE 20 MG PO TABS: 20.0000 mg | ORAL_TABLET | Freq: Every day | ORAL | Status: DC

## 2016-05-11 NOTE — Telephone Encounter (Signed)
PT IS ASKING AND PLEADING IF YOU WILL CALL HER FLUID PILL IN FOR HER FOR SHE HAS NO MORE. PHARM IS [facility] [address]

## 2016-05-11 NOTE — Telephone Encounter (Signed)
Prescription for Lasix 20 mg daily has been sent to her pharmacy

## 2016-05-14 ENCOUNTER — Other Ambulatory Visit: Payer: Self-pay | Admitting: Family Medicine

## 2016-05-18 DIAGNOSIS — G4733 Obstructive sleep apnea (adult) (pediatric): Secondary | ICD-10-CM | POA: Diagnosis not present

## 2016-05-26 ENCOUNTER — Telehealth: Payer: Self-pay | Admitting: Cardiovascular Disease

## 2016-05-26 NOTE — Telephone Encounter (Signed)
Spoke w/ pt.  She reports that she is "tired all the time", though she will push herself to get things done.  She states that her SOB is "not like the SOB that she sees other people have where they look like it's hard to catch their breath". Reports her sx are "more like a tightness in my chest; I feel so tired, get all sweaty and have a bad HA." Reports sx last about 2-3 mins and resolve on their own. She can sometimes walk around the bldg w/ no issues, other times she gets tired just doing her ADLs. Pt weighs herself daily, wts have been consistently stable.  Denies any leg edema, reports her legs and ankles "look good"; she does wear compression hose regularly. Pt drinks sips of water during the day, chews gum and keeps candy on hand for dry mouth. She would like to know if Dr. Rockey Situ recommends any testing that can be done to find out why she is so tired all the time. She is adamant about staying out of the ED and would like to know if outpatient testing can be done to avoid this.  Advised her that I will make Dr. Rockey Situ aware of her concerns and will call back w/ his recommendation.  She is appreciative of the call.

## 2016-05-26 NOTE — Telephone Encounter (Signed)
Terrence Dupont, can you help w/ this? Let me know if you can do this remote or if pt needs an appt.

## 2016-05-26 NOTE — Telephone Encounter (Signed)
I'm concerned she may be having periods of arrhythmia I do not see a recent pacer download I do see one for late 2016 that did show periods of arrhythmia We need to obtain this information

## 2016-05-26 NOTE — Telephone Encounter (Signed)
Pt calling stating she is very tired all the time, and is SOB  Would like to know what can be done to help with this This is been going on for couple months.  Trying to just make sure she doesn't need to go ED Occasionally she states she does have some CP   Pt c/o Shortness Of Breath: STAT if SOB developed within the last 24 hours or pt is noticeably SOB on the phone  1. Are you currently SOB (can you hear that pt is SOB on the phone)? Just a little  2. How long have you been experiencing SOB? Couple months  3. Are you SOB when sitting or when up moving around? Sitting   4. Are you currently experiencing any other symptoms? Could be standing and still get SOB. Feels like it is all in her chest.  Denies CP at the moment   Please advise

## 2016-05-29 NOTE — Telephone Encounter (Signed)
[name] [name] had a remote pacemaker check scheduled for 01/18/16, she called and cancelled it. She reports she is no longer following with [facility]. I'm not sure where she has decided to be followed.

## 2016-05-29 NOTE — Telephone Encounter (Signed)
Patient calling back will tx. To mandi.

## 2016-05-29 NOTE — Telephone Encounter (Signed)
Spoke w/ pt.  Advised her of conversations w/ Dr. Rockey Situ & Terrence Dupont.  She states that was a big mistake and she was depressed when she made the decision to seek a 2nd opinion w/ [facility].  She states that she would prefer to stay w/ [facility] and hopes this will not reflect negatively on her.  She will call the [facility] office to see how to go about getting her pacer checked and see if she can get an appt to see Dr. Lovena Le.

## 2016-05-29 NOTE — Telephone Encounter (Signed)
Pt prefers not to see Dr. Caryl Comes, she states that "he is too smart and I don't understand what he is talking about 1/2 time". She is agreeable to seeing Dr. Lovena Le, as she saw him previously and liked him.  Can we arrange for her to see Dr. Lovena Le?  Thank you!

## 2016-05-29 NOTE — Telephone Encounter (Signed)
She can send a remote transmission today or later this week, we will add her to the schedule. She was seeing Dr. Caryl Comes in [place], she will be due to see him in August if someone in [place] can help arrange this.

## 2016-05-30 ENCOUNTER — Telehealth: Payer: Self-pay | Admitting: Family Medicine

## 2016-05-30 NOTE — Telephone Encounter (Signed)
No problmeem

## 2016-05-30 NOTE — Telephone Encounter (Signed)
Is this ok with Dr. Caryl Comes and Dr. Lovena Le? If so I will schedule in [place] with Lovena Le in August.

## 2016-05-30 NOTE — Telephone Encounter (Signed)
Pt needs refill on Metforming and Nexium to be sent to [place].

## 2016-06-01 NOTE — Telephone Encounter (Signed)
Spoke with patient .  Scheduled for 07/18/16 at 11 am for consult appt with Dr. Lovena Le.

## 2016-06-02 MED ORDER — METFORMIN HCL ER 500 MG PO TB24: ORAL_TABLET | ORAL | Status: DC

## 2016-06-02 MED ORDER — ESOMEPRAZOLE MAGNESIUM 40 MG PO CPDR: DELAYED_RELEASE_CAPSULE | ORAL | Status: DC

## 2016-06-02 NOTE — Telephone Encounter (Signed)
Prescriptions for Metformin and Nexium are sent to pharmacy

## 2016-06-05 NOTE — Telephone Encounter (Signed)
Pt informed

## 2016-06-25 ENCOUNTER — Other Ambulatory Visit: Payer: Self-pay | Admitting: Family Medicine

## 2016-06-26 ENCOUNTER — Encounter (INDEPENDENT_AMBULATORY_CARE_PROVIDER_SITE_OTHER): Payer: Self-pay

## 2016-06-26 ENCOUNTER — Encounter: Payer: Self-pay | Admitting: Cardiovascular Disease

## 2016-06-26 ENCOUNTER — Ambulatory Visit (INDEPENDENT_AMBULATORY_CARE_PROVIDER_SITE_OTHER): Payer: Medicare Other | Admitting: Cardiovascular Disease

## 2016-06-26 VITALS — BP 124/64 | HR 80 | Ht 65.0 in | Wt 240.5 lb

## 2016-06-26 DIAGNOSIS — Z794 Long term (current) use of insulin: Secondary | ICD-10-CM

## 2016-06-26 DIAGNOSIS — I5033 Acute on chronic diastolic (congestive) heart failure: Secondary | ICD-10-CM | POA: Diagnosis not present

## 2016-06-26 DIAGNOSIS — I495 Sick sinus syndrome: Secondary | ICD-10-CM

## 2016-06-26 DIAGNOSIS — R0602 Shortness of breath: Secondary | ICD-10-CM

## 2016-06-26 DIAGNOSIS — I10 Essential (primary) hypertension: Secondary | ICD-10-CM | POA: Diagnosis not present

## 2016-06-26 DIAGNOSIS — E119 Type 2 diabetes mellitus without complications: Secondary | ICD-10-CM

## 2016-06-26 DIAGNOSIS — I480 Paroxysmal atrial fibrillation: Secondary | ICD-10-CM

## 2016-06-26 DIAGNOSIS — E785 Hyperlipidemia, unspecified: Secondary | ICD-10-CM

## 2016-06-26 DIAGNOSIS — G4733 Obstructive sleep apnea (adult) (pediatric): Secondary | ICD-10-CM

## 2016-06-26 NOTE — Progress Notes (Signed)
Cardiology Office Note  Date:  06/26/2016   ID:  MISSIE GEHRIG, DOB 05-23-1935, MRN [id number]  PCP:  Keith Rake, MD   Chief Complaint  Patient presents with  . Other    6 month f/u c/o sob. Meds reviewed verbally with pt.    HPI:  Mrs. Guilliams is a 81 yo woman with a history of atrial fibrillation, obesity, symptomatic palpitations/PVCs, status post pacemaker implantation for sick sinus syndrome/tachybradycardia syndrome with chronic symptoms of palpitations, shortness of breath with exertion, who presents for routine followup of her atrial fibrillation and shortness of breath History of chronic fatigue  In follow-up today, she reports that she is doing relatively well Very long period of time she has had episodes of tachycardia that percent at 2 or 3 in the morning She is compliant with her CPAP. In general she has to get up, walk around, sometimes sit in her recliner until tachycardia symptoms resolve She feels symptoms may be somewhat better but still persistent Eventually symptoms resolved without intervention  No recent evaluation by EP, missed appointment in April 2017, no recent pacer download  Previous 30 day monitor concerning for episodes of atrial fibrillation  Hemoglobin A1c previously in the 8 range, reports her sugars are getting better She was started on new medication No regular exercise program Tolerating warfarin with no complications  EKG on today's visit shows paced rhythm, rate 80 bpm  Other past medical history Recent Pacer changes 10/15, felt better Takes bystolic 20 BID Wears compressions on a regular basis  Previously on HCTZ, had abd cramps, ABD pains (now stopped) Finger fungus, on lamacil daily  June 2013 total cholesterol 155, LDL 82, HDL 56  Other past medical history Previous hospital admission on 09/24/2013 for acute on chronic diastolic CHF. She was drinking a significant amount of fluids. She was treated with IV diuretics with improvement  of her symptoms. Was recommended that she go home with Lasix every other day. She had a stress test while in the hospital showing no ischemia. Echocardiogram dated 09/24/2013 showing ejection fraction 50-55%,, mild to moderate tricuspid regurg, mild to moderate MR, normal RV function, normal right ventricular systolic pressure   ER again on 10/18/2013.   Previous hx of shortness of breath and palpitations.  May 2012, she had adjustments to her pacemaker and since that time has felt back to normal.   she has had long hx of hot flashes, and a racing heart rate when she is in bed. Recently this has been well-controlled on high-dose bystolic.   chronic back and hip pain. She did receive a cortisone shot on the left with no significant improvement. She reports that her sugars have been well-controlled, blood pressure well controlled on current medications   syncope episode August 2013 .  in her bedroom folding clothes and the next thing she knew she found herself on the floor in the bathroom. She spent 2 days in the hospital, then at least 10 days in rehabilitation for significant left arm bruising and she was on warfarin. Pacemaker was interrogated after the fact showing 85% atrial paced rhythm, no other arrhythmia. Etiology concerning for vasovagal episode, though exact cause is uncertain.  She has chronic buzzing in her ear. She does report this is a chronic issue going back many years. Prior workup with ear nose throat. She does have prior exposure to loud noises, worked in a [employer].   cardiac catheterization in 2004, stress testing,   echo  showed normal systolic function,  mild LVH, diastolic relaxation abnormality, mildly elevated right ventricular systolic pressures, mild MR.  treadmill study showing normal blood pressure response to exercise, chronotropic incompetence with peak heart rate of 100 beats per minute, resting heart rate in the 70s. Previous  PFTs  demonstrated extrinsic  compression/restrictive physiology.  PMH:   has a past medical history of 1St degree AV block; Anemia; Anxiety; Arthritis; Chest pain; Chronic diastolic heart failure (HCC); Esophageal reflux; H/O hiatal hernia; History of UTI; Hyperlipidemia; Morbid obesity (HCC); OSA on CPAP; PAF (paroxysmal atrial fibrillation) (HCC); SSS (sick sinus syndrome) (HCC); Symptomatic PVCs; Syncope and collapse; Type II diabetes mellitus (HCC); Unspecified essential hypertension; and Unspecified glaucoma.  PSH:    Past Surgical History:  Procedure Laterality Date  . DILATION AND CURETTAGE OF UTERUS    . INCISION AND DRAINAGE OF WOUND Right 03/1998   "leg; it was like a boil" (12/01/2013)  . INSERT / REPLACE / REMOVE PACEMAKER  2008; 12/01/2013   MDT ADDRL1 pacemaker - gen change by Dr Graciela Husbands 12/01/2013  . LUNG BIOPSY  2010   [facility]  . PERMANENT PACEMAKER GENERATOR CHANGE N/A 12/01/2013   Procedure: PERMANENT PACEMAKER GENERATOR CHANGE;  Surgeon: Duke Salvia, MD;  Location: [facility] CATH LAB;  Service: Cardiovascular;  Laterality: N/A;  . VAGINAL HYSTERECTOMY  1970    Current Outpatient Prescriptions  Medication Sig Dispense Refill  . ACCU-CHEK AVIVA PLUS test strip TEST TWICE DAILY 180 each 3  . ALPRAZolam (XANAX) 0.5 MG tablet Take 1 tablet (0.5 mg total) by mouth daily as needed. 90 tablet 0  . amLODipine (NORVASC) 10 MG tablet TAKE 1 TABLET (10 MG TOTAL) BY MOUTH DAILY. 90 tablet 0  . atorvastatin (LIPITOR) 20 MG tablet TAKE 1 TABLET (20 MG TOTAL) BY MOUTH DAILY. 90 tablet 0  . Blood Glucose Monitoring Suppl (ACCU-CHEK AVIVA PLUS) W/DEVICE KIT daily.     . Brinzolamide-Brimonidine (SIMBRINZA) 1-0.2 % SUSP Place 1 drop into both eyes 2 (two) times daily. 1 Bottle 1  . carvedilol (COREG) 6.25 MG tablet TAKE 1 TABLET (6.25 MG TOTAL) BY MOUTH 2 (TWO) TIMES DAILY WITH MEALS.  11  . colchicine 0.6 MG tablet Take 1 tablet (0.6 mg total) by mouth daily. Take 2 tabs po X 1, followed by 1 tab 1 hr later. (Patient taking  differently: Take 0.6 mg by mouth 3 times/day as needed-between meals & bedtime. ) 10 tablet 0  . esomeprazole (NEXIUM) 40 MG capsule TAKE 1 CAPSULE (40 MG TOTAL) BY MOUTH DAILY. 90 capsule 0  . furosemide (LASIX) 20 MG tablet Take 1 tablet (20 mg total) by mouth daily. 90 tablet 0  . glipiZIDE (GLUCOTROL) 10 MG tablet Take 1 tablet (10 mg total) by mouth 2 (two) times daily before a meal. 135 tablet 0  . metFORMIN (GLUCOPHAGE-XR) 500 MG 24 hr tablet TAKE 1 TABLET (500 MG TOTAL) BY MOUTH 2 (TWO) TIMES DAILY. 180 tablet 0  . MICROLET LANCETS MISC Use as directed to check BG QD 60 each 2  . mometasone (ELOCON) 0.1 % cream Apply topically daily. Apply to ear as directed two times a day for 10 days as needed 45 g 0  . Multiple Vitamins-Minerals (CENTRUM SILVER PO) Take by mouth daily.    Marland Kitchen olmesartan (BENICAR) 40 MG tablet TAKE 1 TABLET (40 MG TOTAL) BY MOUTH DAILY. 90 tablet 0  . TRADJENTA 5 MG TABS tablet TAKE 1 TABLET (5 MG TOTAL) BY MOUTH DAILY. 30 tablet 2  . travoprost, benzalkonium, (TRAVATAN) 0.004 % ophthalmic  solution Place 1 drop into both eyes at bedtime.     Marland Kitchen warfarin (COUMADIN) 3 MG tablet TAKE 1-1.5 TABLETS DAILY AT 6 PM. TAKE 1 TAB ON TUES, THURS, SAT, SUN. TAKE 4.5 MG ON MON, WED, FRI 100 tablet 3   No current facility-administered medications for this visit.      Allergies:   Diltiazem; Metoprolol; and Levofloxacin in d5w   Social History:  The patient  reports that she quit smoking about 26 years ago. Her smoking use included Cigarettes. She has a 17.00 pack-year smoking history. She has never used smokeless tobacco. She reports that she does not drink alcohol or use drugs.   Family History:   family history includes Allergies in her father; Heart disease in her father; Liver cancer in her mother; Pancreatic cancer in her mother; Rheum arthritis in her father.    Review of Systems: Review of Systems  Constitutional: Negative.   Respiratory: Negative.   Cardiovascular:  Positive for palpitations.  Gastrointestinal: Negative.   Musculoskeletal: Negative.   Neurological: Negative.   Psychiatric/Behavioral: Negative.   All other systems reviewed and are negative.    PHYSICAL EXAM: VS:  BP 124/64 (BP Location: Left Arm, Patient Position: Sitting, Cuff Size: Large)   Pulse 80   Ht '5\' 5"'$  (1.651 m)   Wt 240 lb 8 oz (109.1 kg)   BMI 40.02 kg/m  , BMI Body mass index is 40.02 kg/m. GEN: Well nourished, well developed, in no acute distress, obese  HEENT: normal  Neck: no JVD, carotid bruits, or masses Cardiac: RRR; no murmurs, rubs, or gallops,no edema  Respiratory:  clear to auscultation bilaterally, normal work of breathing GI: soft, nontender, nondistended, + BS MS: no deformity or atrophy  Skin: warm and dry, no rash Neuro:  Strength and sensation are intact Psych: euthymic mood, full affect    Recent Labs: 03/15/2016: ALT 30; BUN 15; Creatinine, Ser 1.18; Potassium 4.6; Sodium 142    Lipid Panel Lab Results  Component Value Date   CHOL 144 03/15/2016   HDL 49 03/15/2016   LDLCALC 67 03/15/2016   TRIG 138 03/15/2016      Wt Readings from Last 3 Encounters:  06/26/16 240 lb 8 oz (109.1 kg)  03/23/16 246 lb (111.6 kg)  03/15/16 244 lb 4.8 oz (110.8 kg)       ASSESSMENT AND PLAN:  Paroxysmal atrial fibrillation (HCC) - Plan: EKG 12-Lead Recommended she increase carvedilol up to 1-1/2 pills, even up to 12 mg in the evening for her tachycardia overnight. Scheduled to see EP next month  Essential hypertension Blood pressure is well controlled on today's visit. Changes as above  SICK SINUS/ TACHY-BRADY SYNDROME Pacemaker in place, has not had recent pacemaker download since November 2016 Tachycardia 2 or 3 in the morning, symptomatic  Acute on chronic diastolic CHF (congestive heart failure) ([place]) Appears relatively euvolemic on today's visit. Recommend she continue on her Lasix daily  Hyperlipidemia Cholesterol is at goal on  the current lipid regimen. No changes to the medications were made.  Controlled type 2 diabetes mellitus without complication, with long-term current use of insulin ([place]) Discussed her diabetes with her, previously hemoglobin A1c 8.2 She does report sugars have been doing better  DYSPNEA Long history of chronic mild shortness of breath, some improvement in the past after pacemaker setting changes. Not very symptomatic on today's visit  OSA (obstructive sleep apnea) Compliant with her CPAP Underlying sleep apnea possibly contributing to arrhythmia overnight   Total encounter  time more than 25 minutes  Greater than 50% was spent in counseling and coordination of care with the patient    Disposition:   F/U  6 months   Orders Placed This Encounter  Procedures  . EKG 12-Lead     Signed, Esmond Plants, M.D., Ph.D. 06/26/2016  [place]

## 2016-06-26 NOTE — Patient Instructions (Addendum)
Medication Instructions:   Consider increasing the coreg up to 1 1/2 pill at night If you continue to have tachycardia overnight, You may need two coreg at night   Watch the carbos! Sugars running high  Labwork:  No new labs  Testing/Procedures:  No more testing  Follow-Up: It was a pleasure seeing you in the office today. Please call us if you have new issues that need to be addressed before your next appt.  [phone]  Your physician wants you to follow-up in: 6 months.  You will receive a reminder letter in the mail two months in advance. If you don't receive a letter, please call our office to schedule the follow-up appointment.  If you need a refill on your cardiac medications before your next appointment, please call your pharmacy.

## 2016-06-27 ENCOUNTER — Ambulatory Visit (INDEPENDENT_AMBULATORY_CARE_PROVIDER_SITE_OTHER): Payer: Medicare Other

## 2016-06-27 DIAGNOSIS — Z7901 Long term (current) use of anticoagulants: Secondary | ICD-10-CM

## 2016-06-27 DIAGNOSIS — Z5181 Encounter for therapeutic drug level monitoring: Secondary | ICD-10-CM

## 2016-06-27 LAB — POCT INR: INR: 2.3

## 2016-06-27 LAB — PROTIME-INR: INR: 2.3 — AB (ref 0.9–1.1)

## 2016-06-27 NOTE — Patient Instructions (Signed)
Continue current dosing and return in 1 month

## 2016-06-28 ENCOUNTER — Encounter: Payer: Self-pay | Admitting: *Deleted

## 2016-07-18 ENCOUNTER — Ambulatory Visit (INDEPENDENT_AMBULATORY_CARE_PROVIDER_SITE_OTHER): Payer: Medicare Other | Admitting: Internal Medicine

## 2016-07-18 ENCOUNTER — Encounter: Payer: Self-pay | Admitting: Internal Medicine

## 2016-07-18 DIAGNOSIS — I495 Sick sinus syndrome: Secondary | ICD-10-CM

## 2016-07-18 NOTE — Progress Notes (Signed)
HPI Mrs. Portales returns today after a long absence. She had seen my partner Dr. Caryl Comes for several years but has elected to come over to be seen here in our [place] office. She also sees Dr. Candis Musa. She has PAF and HTN and obesity. She has done fairly well. She has been sedentary but states she plans to start back exercising. She has rare palpitations but denies syncope.  Allergies  Allergen Reactions  . Diltiazem Other (See Comments)    Patient stated that she had bad dreams with this medication  . Metoprolol Other (See Comments)    Bad dreams Patient stated that she had suicidal thoughts while taking this medication.  . Levofloxacin In D5w Nausea Only and Nausea And Vomiting     Current Outpatient Prescriptions  Medication Sig Dispense Refill  . ACCU-CHEK AVIVA PLUS test strip TEST TWICE DAILY 180 each 3  . ALPRAZolam (XANAX) 0.5 MG tablet Take 1 tablet (0.5 mg total) by mouth daily as needed. 90 tablet 0  . amLODipine (NORVASC) 10 MG tablet TAKE 1 TABLET (10 MG TOTAL) BY MOUTH DAILY. 90 tablet 0  . atorvastatin (LIPITOR) 20 MG tablet TAKE 1 TABLET (20 MG TOTAL) BY MOUTH DAILY. 90 tablet 0  . Blood Glucose Monitoring Suppl (ACCU-CHEK AVIVA PLUS) W/DEVICE KIT daily.     . Brinzolamide-Brimonidine (SIMBRINZA) 1-0.2 % SUSP Place 1 drop into both eyes 2 (two) times daily. 1 Bottle 1  . carvedilol (COREG) 6.25 MG tablet TAKE 1 TABLET (6.25 MG TOTAL) BY MOUTH 2 (TWO) TIMES DAILY WITH MEALS.  11  . esomeprazole (NEXIUM) 40 MG capsule TAKE 1 CAPSULE (40 MG TOTAL) BY MOUTH DAILY. 90 capsule 0  . furosemide (LASIX) 20 MG tablet Take 1 tablet (20 mg total) by mouth daily. 90 tablet 0  . glipiZIDE (GLUCOTROL) 10 MG tablet TAKE 1 TABLET (10 MG TOTAL) BY MOUTH 2 (TWO) TIMES DAILY BEFORE A MEAL. 135 tablet 0  . metFORMIN (GLUCOPHAGE-XR) 500 MG 24 hr tablet TAKE 1 TABLET (500 MG TOTAL) BY MOUTH 2 (TWO) TIMES DAILY. 180 tablet 0  . MICROLET LANCETS MISC Use as directed to check BG QD 60 each 2    . mometasone (ELOCON) 0.1 % cream Apply topically daily. Apply to ear as directed two times a day for 10 days as needed 45 g 0  . Multiple Vitamins-Minerals (CENTRUM SILVER PO) Take by mouth daily.    Marland Kitchen olmesartan (BENICAR) 40 MG tablet TAKE 1 TABLET (40 MG TOTAL) BY MOUTH DAILY. 90 tablet 0  . TRADJENTA 5 MG TABS tablet TAKE 1 TABLET (5 MG TOTAL) BY MOUTH DAILY. 30 tablet 2  . travoprost, benzalkonium, (TRAVATAN) 0.004 % ophthalmic solution Place 1 drop into both eyes at bedtime.     Marland Kitchen warfarin (COUMADIN) 3 MG tablet TAKE 1-1.5 TABLETS DAILY AT 6 PM. TAKE 1 TAB ON TUES, THURS, SAT, SUN. TAKE 4.5 MG ON MON, WED, FRI 100 tablet 3   No current facility-administered medications for this visit.      Past Medical History:  Diagnosis Date  . 1St degree AV block    a. PR 400 msec with Apacing  . Anemia   . Anxiety   . Arthritis    a. shoulders.  . Chest pain    a. 2004 Cath:  reportedly nl;  b. 09/2013 Neg MV.  . Chronic diastolic heart failure ([place])    a. 09/2013 Echo: EF 50-55%, mild LVH, mild to mod MR/TR.  Marland Kitchen Esophageal reflux   .  H/O hiatal hernia   . History of UTI   . Hyperlipidemia   . Morbid obesity ([place])   . OSA on CPAP   . PAF (paroxysmal atrial fibrillation) ([place])   . SSS (sick sinus syndrome) ([place])    a. 2008 s/p MDT PPM;  b. 11/2013 Gen change- MDT ADDRL1 Adapta DC PPM, ser # [id number] H.  . Symptomatic PVCs   . Syncope and collapse    a. Felt to be vasovagal.  . Type II diabetes mellitus ([place])   . Unspecified essential hypertension   . Unspecified glaucoma     ROS:   All systems reviewed and negative except as noted in the HPI.   Past Surgical History:  Procedure Laterality Date  . DILATION AND CURETTAGE OF UTERUS    . INCISION AND DRAINAGE OF WOUND Right 03/1998   "leg; it was like a boil" (12/01/2013)  . INSERT / REPLACE / REMOVE PACEMAKER  2008; 12/01/2013   MDT ADDRL1 pacemaker - gen change by Dr Caryl Comes 12/01/2013  . [facility]UNG [facility]OPSY  2010   [facility]  . PERMANENT  PACEMAKER GENERATOR CHANGE N/A 12/01/2013   Procedure: PERMANENT PACEMAKER GENERATOR CHANGE;  Surgeon: Deboraha Sprang, MD;  [facility] LAB;  Service: Cardiovascular;  Laterality: N/A;  . VAGINAL HYSTERECTOMY  1970     Family History  Problem Relation Age of Onset  . Heart disease Father   . Rheum arthritis Father   . Allergies Father   . Liver cancer Mother   . Pancreatic cancer Mother      Social History   Social History  . Marital status: Widowed    Spouse name: N/A  . Number of children: Y  . Years of education: N/A   Occupational History  . retired    Social History Main Topics  . Smoking status: Former Smoker    Packs/day: 0.50    Years: 34.00    Types: Cigarettes    Quit date: 08/15/1989  . Smokeless tobacco: Never Used  . Alcohol use No  . Drug use: No  . Sexual activity: No   Other Topics Concern  . Not on file   Social History Narrative   Retired. Widowed. Regularly exercises.      BP 134/68   Pulse 84   Ht '5\' 5"'  (1.651 m)   Wt 239 lb (108.4 kg)   BMI 39.77 kg/m   Physical Exam:  obese appearing 81 yo woman NAD HEENT: Unremarkable Neck:  7 cm JVD, no thyromegally Lymphatics:  No adenopathy Back:  No CVA tenderness Lungs:  Clear with no wheezes HEART:  Regular rate rhythm, no murmurs, no rubs, no clicks Abd:  soft, positive bowel sounds, no organomegally, no rebound, no guarding Ext:  2 plus pulses, no edema, no cyanosis, no clubbing Skin:  No rashes no nodules Neuro:  CN II through XII intact, motor grossly intact   DEVICE  Normal device function.  See PaceArt for details.   Assess/Plan: 1. PAF - she will continue her current meds. We discussed the importance of taking her coumadin. 2. HTN - her blood pressure is ok today. She is encouraged to reduce her salt intake. 3. PPM - her medtronic DDD PM is working normally. Will follow. 4. Obesity - I have asked her to lose weight. She states she will try to.  Mikle Bosworth.D.

## 2016-07-18 NOTE — Patient Instructions (Signed)
Medication Instructions:  Your physician recommends that you continue on your current medications as directed. Please refer to the Current Medication list given to you today.   Labwork: None ordered   Testing/Procedures: None ordered   Follow-Up: Remote monitoring is used to monitor your Pacemaker from home. This monitoring reduces the number of office visits required to check your device to one time per year. It allows [place] to keep an eye on the functioning of your device to ensure it is working properly. You are scheduled for a device check from home on 10/17/16. You may send your transmission at any time that day. If you have a wireless device, the transmission will be sent automatically. After your physician reviews your transmission, you will receive a postcard with your next transmission date.  Your physician wants you to follow-up in: 12 months with Dr Knox Saliva will receive a reminder letter in the mail two months in advance. If you don't receive a letter, please call our office to schedule the follow-up appointment.     Any Other Special Instructions Will Be Listed Below (If Applicable).     If you need a refill on your cardiac medications before your next appointment, please call your pharmacy.

## 2016-07-19 ENCOUNTER — Encounter: Payer: Self-pay | Admitting: Internal Medicine

## 2016-07-21 LAB — CUP PACEART INCLINIC DEVICE CHECK
Battery Impedance: 133 Ohm
Battery Remaining Longevity: 132 mo
Battery Voltage: 2.79 V
Brady Statistic AP VP Percent: 10 %
Brady Statistic AP VS Percent: 83 %
Brady Statistic AS VP Percent: 0 %
Brady Statistic AS VS Percent: 6 %
Date Time Interrogation Session: 20170815135530
Implantable Lead Implant Date: 20080117
Implantable Lead Implant Date: 20080117
Implantable Lead Location: 753859
Implantable Lead Location: 753860
Implantable Lead Model: 5076
Implantable Lead Model: 5076
Lead Channel Impedance Value: 440 Ohm
Lead Channel Impedance Value: 604 Ohm
Lead Channel Pacing Threshold Amplitude: 0.5 V
Lead Channel Pacing Threshold Amplitude: 0.5 V
Lead Channel Pacing Threshold Amplitude: 0.75 V
Lead Channel Pacing Threshold Amplitude: 0.75 V
Lead Channel Pacing Threshold Pulse Width: 0.4 ms
Lead Channel Pacing Threshold Pulse Width: 0.4 ms
Lead Channel Pacing Threshold Pulse Width: 0.4 ms
Lead Channel Pacing Threshold Pulse Width: 0.4 ms
Lead Channel Sensing Intrinsic Amplitude: 11.2 mV
Lead Channel Sensing Intrinsic Amplitude: 2.8 mV
Lead Channel Setting Pacing Amplitude: 2 V
Lead Channel Setting Pacing Amplitude: 2.5 V
Lead Channel Setting Pacing Pulse Width: 0.4 ms
Lead Channel Setting Sensing Sensitivity: 4 mV

## 2016-07-28 ENCOUNTER — Encounter: Payer: Self-pay | Admitting: Family Medicine

## 2016-07-28 ENCOUNTER — Ambulatory Visit (INDEPENDENT_AMBULATORY_CARE_PROVIDER_SITE_OTHER): Payer: Medicare Other | Admitting: Family Medicine

## 2016-07-28 VITALS — BP 144/70 | HR 84 | Temp 98.4°F | Resp 16 | Ht 65.0 in | Wt 241.2 lb

## 2016-07-28 DIAGNOSIS — F419 Anxiety disorder, unspecified: Secondary | ICD-10-CM

## 2016-07-28 DIAGNOSIS — I480 Paroxysmal atrial fibrillation: Secondary | ICD-10-CM | POA: Diagnosis not present

## 2016-07-28 DIAGNOSIS — N183 Chronic kidney disease, stage 3 (moderate): Secondary | ICD-10-CM

## 2016-07-28 DIAGNOSIS — E1122 Type 2 diabetes mellitus with diabetic chronic kidney disease: Secondary | ICD-10-CM

## 2016-07-28 DIAGNOSIS — I5032 Chronic diastolic (congestive) heart failure: Secondary | ICD-10-CM | POA: Diagnosis not present

## 2016-07-28 LAB — POCT INR: INR: 1.8

## 2016-07-28 LAB — GLUCOSE, POCT (MANUAL RESULT ENTRY): POC Glucose: 175 mg/dl (ref 70–99)

## 2016-07-28 LAB — POCT GLYCOSYLATED HEMOGLOBIN (HGB A1C): Hemoglobin A1C: 6.8

## 2016-07-28 MED ORDER — ALPRAZOLAM 0.5 MG PO TABS
0.5000 mg | ORAL_TABLET | Freq: Every day | ORAL | 0 refills | Status: DC | PRN
Start: 2016-07-28 — End: 2016-11-01

## 2016-07-28 MED ORDER — FUROSEMIDE 20 MG PO TABS: 20.0000 mg | ORAL_TABLET | Freq: Every day | ORAL | 0 refills | Status: DC

## 2016-07-28 MED ORDER — ALPRAZOLAM 0.5 MG PO TABS: 0.5000 mg | ORAL_TABLET | Freq: Every day | ORAL | 0 refills | Status: DC | PRN

## 2016-07-28 NOTE — Progress Notes (Deleted)
Name: [name] [name]   MRN: [id number]    DOB: Jan 02, 1935   Date:07/28/2016       Progress Note  Subjective  Chief Complaint  Chief Complaint  Patient presents with  . Diabetes    3 month follow up  . Hypertension  . Atrial Fibrillation    coumadin check    HPI  ***  Past Medical History:  Diagnosis Date  . 1St degree AV block    a. PR 400 msec with Apacing  . Anemia   . Anxiety   . Arthritis    a. shoulders.  . Chest pain    a. 2004 Cath:  reportedly nl;  b. 09/2013 Neg MV.  . Chronic diastolic heart failure ([place])    a. 09/2013 Echo: EF 50-55%, mild LVH, mild to mod MR/TR.  Marland Kitchen Esophageal reflux   . H/O hiatal hernia   . History of UTI   . Hyperlipidemia   . Morbid obesity ([place])   . OSA on CPAP   . PAF (paroxysmal atrial fibrillation) ([place])   . SSS (sick sinus syndrome) ([place])    a. 2008 s/p MDT PPM;  b. 11/2013 Gen change- MDT ADDRL1 Adapta DC PPM, ser # [id number] H.  . Symptomatic PVCs   . Syncope and collapse    a. Felt to be vasovagal.  . Type II diabetes mellitus ([place])   . Unspecified essential hypertension   . Unspecified glaucoma     Past Surgical History:  Procedure Laterality Date  . DILATION AND CURETTAGE OF UTERUS    . INCISION AND DRAINAGE OF WOUND Right 03/1998   "leg; it was like a boil" (12/01/2013)  . INSERT / REPLACE / REMOVE PACEMAKER  2008; 12/01/2013   MDT ADDRL1 pacemaker - gen change by Dr Caryl Comes 12/01/2013  . [facility]OPSY  [facility]   [facility]  . PERMANENT PACEMAKER GENERATOR CHANGE N/A 12/01/2013   Procedure: PERMANENT PACEMAKER GENERATOR CHANGE;  Surgeon: Deboraha Sprang, MD;  Location: [facility] CATH LAB;  Service: Cardiovascular;  Laterality: N/A;  . VAGINAL HYSTERECTOMY  1970    Family History  Problem Relation Age of Onset  . Heart disease Father   . Rheum arthritis Father   . Allergies Father   . Liver cancer Mother   . Pancreatic cancer Mother     Social History   Social History  . Marital status: Widowed    Spouse name: N/A  . Number  of children: Y  . Years of education: N/A   Occupational History  . retired    Social History Main Topics  . Smoking status: Former Smoker    Packs/day: 0.50    Years: 34.00    Types: Cigarettes    Quit date: 08/15/1989  . Smokeless tobacco: Never Used  . Alcohol use No  . Drug use: No  . Sexual activity: No   Other Topics Concern  . Not on file   Social History Narrative   Retired. Widowed. Regularly exercises.      Current Outpatient Prescriptions:  .  ACCU-CHEK AVIVA PLUS test strip, TEST TWICE DAILY, Disp: 180 each, Rfl: 3 .  ALPRAZolam (XANAX) 0.5 MG tablet, Take 1 tablet (0.5 mg total) by mouth daily as needed., Disp: 90 tablet, Rfl: 0 .  amLODipine (NORVASC) 10 MG tablet, TAKE 1 TABLET (10 MG TOTAL) BY MOUTH DAILY., Disp: 90 tablet, Rfl: 0 .  atorvastatin (LIPITOR) 20 MG tablet, TAKE 1 TABLET (20 MG TOTAL) BY MOUTH DAILY., Disp: 90 tablet, Rfl: 0 .  Blood Glucose Monitoring Suppl (ACCU-CHEK AVIVA PLUS) W/DEVICE KIT, daily. , Disp: , Rfl:  .  Brinzolamide-Brimonidine (SIMBRINZA) 1-0.2 % SUSP, Place 1 drop into both eyes 2 (two) times daily., Disp: 1 Bottle, Rfl: 1 .  carvedilol (COREG) 6.25 MG tablet, TAKE 1 TABLET (6.25 MG TOTAL) BY MOUTH 2 (TWO) TIMES DAILY WITH MEALS., Disp: , Rfl: 11 .  esomeprazole (NEXIUM) 40 MG capsule, TAKE 1 CAPSULE (40 MG TOTAL) BY MOUTH DAILY., Disp: 90 capsule, Rfl: 0 .  furosemide (LASIX) 20 MG tablet, Take 1 tablet (20 mg total) by mouth daily., Disp: 90 tablet, Rfl: 0 .  glipiZIDE (GLUCOTROL) 10 MG tablet, TAKE 1 TABLET (10 MG TOTAL) BY MOUTH 2 (TWO) TIMES DAILY BEFORE A MEAL., Disp: 135 tablet, Rfl: 0 .  metFORMIN (GLUCOPHAGE-XR) 500 MG 24 hr tablet, TAKE 1 TABLET (500 MG TOTAL) BY MOUTH 2 (TWO) TIMES DAILY., Disp: 180 tablet, Rfl: 0 .  MICROLET LANCETS MISC, Use as directed to check BG QD, Disp: 60 each, Rfl: 2 .  mometasone (ELOCON) 0.1 % cream, Apply topically daily. Apply to ear as directed two times a day for 10 days as needed, Disp: 45  g, Rfl: 0 .  Multiple Vitamins-Minerals (CENTRUM SILVER PO), Take by mouth daily., Disp: , Rfl:  .  olmesartan (BENICAR) 40 MG tablet, TAKE 1 TABLET (40 MG TOTAL) BY MOUTH DAILY., Disp: 90 tablet, Rfl: 0 .  TRADJENTA 5 MG TABS tablet, TAKE 1 TABLET (5 MG TOTAL) BY MOUTH DAILY., Disp: 30 tablet, Rfl: 2 .  travoprost, benzalkonium, (TRAVATAN) 0.004 % ophthalmic solution, Place 1 drop into both eyes at bedtime. , Disp: , Rfl:  .  warfarin (COUMADIN) 3 MG tablet, TAKE 1-1.5 TABLETS DAILY AT 6 PM. TAKE 1 TAB ON TUES, THURS, SAT, SUN. TAKE 4.5 MG ON MON, WED, FRI, Disp: 100 tablet, Rfl: 3  Allergies  Allergen Reactions  . Diltiazem Other (See Comments)    Patient stated that she had bad dreams with this medication  . Metoprolol Other (See Comments)    Bad dreams Patient stated that she had suicidal thoughts while taking this medication.  . Levofloxacin In D5w Nausea Only and Nausea And Vomiting     ROS  ***  Objective  Vitals:   07/28/16 1014  BP: (!) 144/70  Pulse: 84  Resp: 16  Temp: 98.4 F (36.9 C)  TempSrc: Oral  SpO2: 94%  Weight: 241 lb 3.2 oz (109.4 kg)  Height: 5' 5" (1.651 m)    Physical Exam   ***  Recent Results (from the past 2160 hour(s))  POCT INR     Status: None   Collection Time: 05/10/16  9:10 AM  Result Value Ref Range   INR 2.5   POCT INR     Status: Normal   Collection Time: 05/10/16 10:12 AM  Result Value Ref Range   INR 2.5   POCT INR     Status: None   Collection Time: 06/27/16 10:56 AM  Result Value Ref Range   INR 2.3   Protime-INR     Status: Abnormal   Collection Time: 06/27/16 10:56 AM  Result Value Ref Range   INR 2.3 (A) 0.9 - 1.1  Implantable device check     Status: None   Collection Time: 07/18/16  1:55 PM  Result Value Ref Range   Date Time Interrogation Session [id number]    Pulse Generator Manufacturer MERM    Pulse Gen Model ADDRL1 Adapta    Pulse Gen Serial Number  [id number]    Implantable Pulse Generator Type  Implantable Pulse Generator    Implantable Pulse Generator Implant Date [id number]    Implantable Lead Manufacturer MERM    Implantable Lead Model 5076 CapSureFix Novus    Implantable Lead Serial Number [id number]    Implantable Lead Implant Date [id number]    Implantable Lead Location [id number]    Implantable Lead Manufacturer MERM    Implantable Lead Model 5076 CapSureFix Novus    Implantable Lead Serial Number [id number]    Implantable Lead Implant Date [id number]    Implantable Lead Location [id number]    Lead Channel Setting Sensing Sensitivity 4.00 mV   Lead Channel Setting Pacing Amplitude 2.000 V   Lead Channel Setting Pacing Pulse Width 0.40 ms   Lead Channel Setting Pacing Amplitude 2.500 V   Lead Channel Impedance Value 440 ohm   Lead Channel Sensing Intrinsic Amplitude 2.80 mV   Lead Channel Pacing Threshold Amplitude 0.500 V   Lead Channel Pacing Threshold Pulse Width 0.40 ms   Lead Channel Pacing Threshold Amplitude 0.500 V   Lead Channel Pacing Threshold Pulse Width 0.40 ms   Lead Channel Impedance Value 604 ohm   Lead Channel Sensing Intrinsic Amplitude 11.20 mV   Lead Channel Pacing Threshold Amplitude 0.750 V   Lead Channel Pacing Threshold Pulse Width 0.40 ms   Lead Channel Pacing Threshold Amplitude 0.750 V   Lead Channel Pacing Threshold Pulse Width 0.40 ms   Battery Status OK    Battery Remaining Longevity 132 mo   Battery Voltage 2.79 V   Battery Impedance 133 ohm   Brady Statistic AP VP Percent 10 %   Brady Statistic AS VP Percent 0 %   Brady Statistic AP VS Percent 83 %   Brady Statistic AS VS Percent 6 %   Eval Rhythm SB 53   POCT HgB A1C     Status: None   Collection Time: 07/28/16 10:25 AM  Result Value Ref Range   Hemoglobin A1C 6.8   POCT Glucose (CBG)     Status: None   Collection Time: 07/28/16 10:25 AM  Result Value Ref Range   POC Glucose 175 lbs 70 - 99 mg/dl  POCT INR     Status: None   Collection Time: 07/28/16 10:26 AM  Result Value  Ref Range   INR 1.8     Comment: PT 22.0     Assessment & Plan  1. Diabetes mellitus due to underlying condition with other specified complication (HCC) *** - POCT HgB A1C - POCT Glucose (CBG)  2. Persistent atrial fibrillation (HCC) *** - POCT INR    [facility] 07/28/2016 10:43 AM

## 2016-07-28 NOTE — Progress Notes (Signed)
HPI        ROS  Physical Exam  .

## 2016-07-28 NOTE — Progress Notes (Signed)
Name: [name] [name]   MRN: [id number]    DOB: 1935-10-23   Date:07/28/2016       Progress Note  Subjective  Chief Complaint  Chief Complaint  Patient presents with  . Diabetes    3 month follow up  . Hypertension  . Atrial Fibrillation    coumadin check    Diabetes  She presents for her follow-up diabetic visit. She has type 2 diabetes mellitus. Her disease course has been stable. Hypoglycemia symptoms include nervousness/anxiousness. Pertinent negatives for hypoglycemia include no headaches. Pertinent negatives for diabetes include no blurred vision, no chest pain, no fatigue, no polydipsia and no polyuria. Pertinent negatives for diabetic complications include no CVA or heart disease. Current diabetic treatment includes oral agent (triple therapy). She has had a previous visit with a dietitian. Her breakfast blood glucose range is generally 110-130 mg/dl.  Hypertension  This is a chronic problem. The problem is unchanged. The problem is controlled. Associated symptoms include peripheral edema (takes Furosemide for ankle swelling). Pertinent negatives include no blurred vision, chest pain, headaches, palpitations or shortness of breath. Past treatments include angiotensin blockers, beta blockers, calcium channel blockers and diuretics. Hypertensive end-organ damage includes kidney disease. There is no history of CAD/MI or CVA.  Hyperlipidemia  This is a chronic problem. The problem is controlled. Recent lipid tests were reviewed and are normal. Pertinent negatives include no chest pain, leg pain, myalgias or shortness of breath. Current antihyperlipidemic treatment includes statins. Risk factors for coronary artery disease include dyslipidemia, obesity and diabetes mellitus.     Past Medical History:  Diagnosis Date  . 1St degree AV block    a. PR 400 msec with Apacing  . Anemia   . Anxiety   . Arthritis    a. shoulders.  . Chest pain    a. 2004 Cath:  reportedly nl;  b. 09/2013 Neg  MV.  . Chronic diastolic heart failure ([place])    a. 09/2013 Echo: EF 50-55%, mild LVH, mild to mod MR/TR.  Marland Kitchen Esophageal reflux   . H/O hiatal hernia   . History of UTI   . Hyperlipidemia   . Morbid obesity ([place])   . OSA on CPAP   . PAF (paroxysmal atrial fibrillation) ([place])   . SSS (sick sinus syndrome) ([place])    a. 2008 s/p MDT PPM;  b. 11/2013 Gen change- MDT ADDRL1 Adapta DC PPM, ser # [id number].  . Symptomatic PVCs   . Syncope and collapse    a. Felt to be vasovagal.  . Type II diabetes mellitus ([place])   . Unspecified essential hypertension   . Unspecified glaucoma     Past Surgical History:  Procedure Laterality Date  . DILATION AND CURETTAGE OF UTERUS    . INCISION AND DRAINAGE OF WOUND Right 03/1998   "leg; it was like a boil" (12/01/2013)  . INSERT / REPLACE / REMOVE PACEMAKER  2008; 12/01/2013   MDT ADDRL1 pacemaker - gen change by Dr Caryl Comes 12/01/2013  . [facility]G BIOPSY  2010   [facility]  . PERMANENT PACEMAKER GENERATOR CHANGE N/A 12/01/2013   Procedure: PERMANENT PACEMAKER GENERATOR CHANGE;  Surgeon: Deboraha Sprang, MD;  Location: [facility] CATH LAB;  Service: Cardiovascular;  Laterality: N/A;  . VAGINAL HYSTERECTOMY  1970    Family History  Problem Relation Age of Onset  . Heart disease Father   . Rheum arthritis Father   . Allergies Father   . Liver cancer Mother   . Pancreatic cancer Mother  Social History   Social History  . Marital status: Widowed    Spouse name: N/A  . Number of children: Y  . Years of education: N/A   Occupational History  . retired    Social History Main Topics  . Smoking status: Former Smoker    Packs/day: 0.50    Years: 34.00    Types: Cigarettes    Quit date: 08/15/1989  . Smokeless tobacco: Never Used  . Alcohol use No  . Drug use: No  . Sexual activity: No   Other Topics Concern  . Not on file   Social History Narrative   Retired. Widowed. Regularly exercises.      Current Outpatient Prescriptions:  .  ACCU-CHEK AVIVA  PLUS test strip, TEST TWICE DAILY, Disp: 180 each, Rfl: 3 .  ALPRAZolam (XANAX) 0.5 MG tablet, Take 1 tablet (0.5 mg total) by mouth daily as needed., Disp: 90 tablet, Rfl: 0 .  amLODipine (NORVASC) 10 MG tablet, TAKE 1 TABLET (10 MG TOTAL) BY MOUTH DAILY., Disp: 90 tablet, Rfl: 0 .  atorvastatin (LIPITOR) 20 MG tablet, TAKE 1 TABLET (20 MG TOTAL) BY MOUTH DAILY., Disp: 90 tablet, Rfl: 0 .  Blood Glucose Monitoring Suppl (ACCU-CHEK AVIVA PLUS) W/DEVICE KIT, daily. , Disp: , Rfl:  .  Brinzolamide-Brimonidine (SIMBRINZA) 1-0.2 % SUSP, Place 1 drop into both eyes 2 (two) times daily., Disp: 1 Bottle, Rfl: 1 .  carvedilol (COREG) 6.25 MG tablet, TAKE 1 TABLET (6.25 MG TOTAL) BY MOUTH 2 (TWO) TIMES DAILY WITH MEALS., Disp: , Rfl: 11 .  esomeprazole (NEXIUM) 40 MG capsule, TAKE 1 CAPSULE (40 MG TOTAL) BY MOUTH DAILY., Disp: 90 capsule, Rfl: 0 .  furosemide (LASIX) 20 MG tablet, Take 1 tablet (20 mg total) by mouth daily., Disp: 90 tablet, Rfl: 0 .  glipiZIDE (GLUCOTROL) 10 MG tablet, TAKE 1 TABLET (10 MG TOTAL) BY MOUTH 2 (TWO) TIMES DAILY BEFORE A MEAL., Disp: 135 tablet, Rfl: 0 .  metFORMIN (GLUCOPHAGE-XR) 500 MG 24 hr tablet, TAKE 1 TABLET (500 MG TOTAL) BY MOUTH 2 (TWO) TIMES DAILY., Disp: 180 tablet, Rfl: 0 .  MICROLET LANCETS MISC, Use as directed to check BG QD, Disp: 60 each, Rfl: 2 .  mometasone (ELOCON) 0.1 % cream, Apply topically daily. Apply to ear as directed two times a day for 10 days as needed, Disp: 45 g, Rfl: 0 .  Multiple Vitamins-Minerals (CENTRUM SILVER PO), Take by mouth daily., Disp: , Rfl:  .  olmesartan (BENICAR) 40 MG tablet, TAKE 1 TABLET (40 MG TOTAL) BY MOUTH DAILY., Disp: 90 tablet, Rfl: 0 .  TRADJENTA 5 MG TABS tablet, TAKE 1 TABLET (5 MG TOTAL) BY MOUTH DAILY., Disp: 30 tablet, Rfl: 2 .  travoprost, benzalkonium, (TRAVATAN) 0.004 % ophthalmic solution, Place 1 drop into both eyes at bedtime. , Disp: , Rfl:  .  warfarin (COUMADIN) 3 MG tablet, TAKE 1-1.5 TABLETS DAILY AT 6  PM. TAKE 1 TAB ON TUES, THURS, SAT, SUN. TAKE 4.5 MG ON MON, WED, FRI, Disp: 100 tablet, Rfl: 3  Allergies  Allergen Reactions  . Diltiazem Other (See Comments)    Patient stated that she had bad dreams with this medication  . Metoprolol Other (See Comments)    Bad dreams Patient stated that she had suicidal thoughts while taking this medication.  . Levofloxacin In D5w Nausea Only and Nausea And Vomiting     Review of Systems  Constitutional: Negative for chills, fatigue and fever.  Eyes: Negative for blurred vision.  Respiratory: Negative for cough and shortness of breath.   Cardiovascular: Negative for chest pain, palpitations and leg swelling.  Gastrointestinal: Negative for abdominal pain.  Musculoskeletal: Negative for myalgias.  Neurological: Negative for headaches.  Endo/Heme/Allergies: Negative for polydipsia.  Psychiatric/Behavioral: The patient is nervous/anxious and has insomnia.     Objective  Vitals:   07/28/16 1014  BP: (!) 144/70  Pulse: 84  Resp: 16  Temp: 98.4 F (36.9 C)  TempSrc: Oral  SpO2: 94%  Weight: 241 lb 3.2 oz (109.4 kg)  Height: _0  (1.651 m)    Physical Exam  Constitutional: She is well-developed, well-nourished, and in no distress.  HENT:  Head: Normocephalic and atraumatic.  Cardiovascular: Normal rate, regular rhythm, S1 normal and S2 normal.   No murmur heard. Pulmonary/Chest: Breath sounds normal. She has no wheezes. She has no rhonchi.  Abdominal: Soft. Bowel sounds are normal. There is no tenderness.  Musculoskeletal:       Right ankle: She exhibits swelling. No tenderness.       Left ankle: She exhibits swelling. No tenderness.  Psychiatric: Mood, memory, affect and judgment normal.  Nursing note and vitals reviewed.      Assessment & Plan  1. Type 2 diabetes mellitus with stage 3 chronic kidney disease, without long-term current use of insulin (HCC) Hemoglobin A1c 6.8%, well-controlled diabetes. Continue on present  therapy - POCT HgB A1C - POCT Glucose (CBG)  2. Anxiety Continue on alprazolam 0.5 mg daily as needed - ALPRAZolam (XANAX) 0.5 MG tablet; Take 1 tablet (0.5 mg total) by mouth daily as needed.  Dispense: 90 tablet; Refill: 0  3. Chronic diastolic heart failure (HCC)  - furosemide (LASIX) 20 MG tablet; Take 1 tablet (20 mg total) by mouth daily.  Dispense: 90 tablet; Refill: 0  4. PAF (paroxysmal atrial fibrillation) (HCC) INR at 1.8, subtherapeutic. Advised to increase warfarin to 4.5 mg on Saturday and Sunday, resume usual dosage schedule from Monday, August 28 and recheck in one month - POCT INR     Donnelle Rubey [facility] 07/28/2016

## 2016-07-28 NOTE — Progress Notes (Signed)
174 

## 2016-08-02 DIAGNOSIS — D582 Other hemoglobinopathies: Secondary | ICD-10-CM | POA: Insufficient documentation

## 2016-08-02 NOTE — Progress Notes (Signed)
[place]  [phone] [phone]  ID: [name] [name] OB: 1935/06/13  MR#: [id number]  BXU#:[id number]  Patient Care Team: Roselee Nova, MD as PCP - General (Family Medicine) Deboraha Sprang, MD as Consulting Physician (Cardiology) Minna Merritts, MD as Consulting Physician (Cardiology)  CHIEF COMPLAINT: Microcytic anemia  INTERVAL HISTORY: Patient is a 81 year old female who was noted to have persistent microcytic anemia on routine blood work. She currently feels well and is asymptomatic. She does not complain of weakness or fatigue. She has good appetite and denies weight loss. She has no neurologic complaints. She denies any recent fevers or illnesses. She has no chest pain or shortness of breath. She denies any nausea, vomiting, constipation, or diarrhea. She has no melena or hematochezia. She has no urinary complaints. Patient feels at her baseline and offers no specific complaints today.  REVIEW OF SYSTEMS:   Review of Systems  Constitutional: Negative.  Negative for fever, malaise/fatigue and weight loss.  Respiratory: Negative.  Negative for cough and shortness of breath.   Cardiovascular: Negative.  Negative for chest pain.  Gastrointestinal: Negative.  Negative for abdominal pain, blood in stool and melena.  Genitourinary: Negative.   Musculoskeletal: Negative.   Neurological: Negative.  Negative for weakness.  Psychiatric/Behavioral: Negative.  The patient is not nervous/anxious.     As per HPI. Otherwise, a complete review of systems is negatve.  PAST MEDICAL HISTORY: Past Medical History:  Diagnosis Date  . 1St degree AV block    a. PR 400 msec with Apacing  . Anemia   . Anxiety   . Arthritis    a. shoulders.  . Chest pain    a. 2004 Cath:  reportedly nl;  b. 09/2013 Neg MV.  . Chronic diastolic heart failure ([place])    a. 09/2013 Echo: EF 50-55%, mild LVH, mild to mod MR/TR.  Marland Kitchen Esophageal reflux   . H/O hiatal hernia    . History of UTI   . Hyperlipidemia   . Morbid obesity ([place])   . OSA on CPAP   . PAF (paroxysmal atrial fibrillation) ([place])   . SSS (sick sinus syndrome) ([place])    a. 2008 s/p MDT PPM;  b. 11/2013 Gen change- MDT ADDRL1 Adapta DC PPM, ser # [id number] H.  . Symptomatic PVCs   . Syncope and collapse    a. Felt to be vasovagal.  . Type II diabetes mellitus ([place])   . Unspecified essential hypertension   . Unspecified glaucoma     PAST SURGICAL HISTORY: Past Surgical History:  Procedure Laterality Date  . DILATION AND CURETTAGE OF UTERUS    . INCISION AND DRAINAGE OF WOUND Right 03/1998   "leg; it was like a boil" (12/01/2013)  . INSERT / REPLACE / REMOVE PACEMAKER  2008; 12/01/2013   MDT ADDRL1 pacemaker - gen change by Dr Caryl Comes 12/01/2013  . [facility]  [facility]   [facility]  . PERMANENT PACEMAKER GENERATOR CHANGE N/A 12/01/2013   Procedure: PERMANENT PACEMAKER GENERATOR CHANGE;  Surgeon: Deboraha Sprang, MD;  Location: [facility] CATH LAB;  Service: Cardiovascular;  Laterality: N/A;  . VAGINAL HYSTERECTOMY  1970    FAMILY HISTORY: Family History  Problem Relation Age of Onset  . Heart disease Father   . Rheum arthritis Father   . Allergies Father   . Liver cancer Mother   . Pancreatic cancer Mother        ADVANCED DIRECTIVES (Y/N):  N   HEALTH MAINTENANCE: Social History  Substance Use Topics  . Smoking status: Former Smoker    Packs/day: 0.50    Years: 34.00    Types: Cigarettes    Quit date: 08/15/1989  . Smokeless tobacco: Never Used  . Alcohol use No     Colonoscopy:  PAP:  Bone density:  Lipid panel:  Allergies  Allergen Reactions  . Diltiazem Other (See Comments)    Patient stated that she had bad dreams with this medication  . Metoprolol Other (See Comments)    Bad dreams Patient stated that she had suicidal thoughts while taking this medication.  . Levofloxacin In D5w Nausea Only and Nausea And Vomiting    Current Outpatient Prescriptions  Medication Sig  Dispense Refill  . ACCU-CHEK AVIVA PLUS test strip TEST TWICE DAILY 180 each 3  . ALPRAZolam (XANAX) 0.5 MG tablet Take 1 tablet (0.5 mg total) by mouth daily as needed. 90 tablet 0  . amLODipine (NORVASC) 10 MG tablet TAKE 1 TABLET (10 MG TOTAL) BY MOUTH DAILY. 90 tablet 0  . atorvastatin (LIPITOR) 20 MG tablet TAKE 1 TABLET (20 MG TOTAL) BY MOUTH DAILY. 90 tablet 0  . Blood Glucose Monitoring Suppl (ACCU-CHEK AVIVA PLUS) W/DEVICE KIT daily.     . Brinzolamide-Brimonidine (SIMBRINZA) 1-0.2 % SUSP Place 1 drop into both eyes 2 (two) times daily. 1 Bottle 1  . carvedilol (COREG) 6.25 MG tablet TAKE 1 TABLET (6.25 MG TOTAL) BY MOUTH 2 (TWO) TIMES DAILY WITH MEALS.  11  . esomeprazole (NEXIUM) 40 MG capsule TAKE 1 CAPSULE (40 MG TOTAL) BY MOUTH DAILY. 90 capsule 0  . furosemide (LASIX) 20 MG tablet Take 1 tablet (20 mg total) by mouth daily. 90 tablet 0  . glipiZIDE (GLUCOTROL) 10 MG tablet TAKE 1 TABLET (10 MG TOTAL) BY MOUTH 2 (TWO) TIMES DAILY BEFORE A MEAL. 135 tablet 0  . metFORMIN (GLUCOPHAGE-XR) 500 MG 24 hr tablet TAKE 1 TABLET (500 MG TOTAL) BY MOUTH 2 (TWO) TIMES DAILY. 180 tablet 0  . MICROLET LANCETS MISC Use as directed to check BG QD 60 each 2  . mometasone (ELOCON) 0.1 % cream Apply topically daily. Apply to ear as directed two times a day for 10 days as needed 45 g 0  . Multiple Vitamins-Minerals (CENTRUM SILVER PO) Take by mouth daily.    . olmesartan (BENICAR) 40 MG tablet TAKE 1 TABLET (40 MG TOTAL) BY MOUTH DAILY. 90 tablet 0  . TRADJENTA 5 MG TABS tablet TAKE 1 TABLET (5 MG TOTAL) BY MOUTH DAILY. 30 tablet 2  . travoprost, benzalkonium, (TRAVATAN) 0.004 % ophthalmic solution Place 1 drop into both eyes at bedtime.     . warfarin (COUMADIN) 3 MG tablet TAKE 1-1.5 TABLETS DAILY AT 6 PM. TAKE 1 TAB ON TUES, THURS, SAT, SUN. TAKE 4.5 MG ON MON, WED, FRI 100 tablet 3   No current facility-administered medications for this visit.     OBJECTIVE: Vitals:   08/03/16 1108  BP:  127/79  Pulse: 64  Resp: 18  Temp: (!) 96.7 F (35.9 C)     Body mass index is 39.97 kg/m.    ECOG FS:0 - Asymptomatic  General: Well-developed, well-nourished, no acute distress. Eyes: Pink conjunctiva, anicteric sclera. HEENT: Normocephalic, moist mucous membranes, clear oropharnyx. Lungs: Clear to auscultation bilaterally. Heart: Regular rate and rhythm. No rubs, murmurs, or gallops. Abdomen: Soft, nontender, nondistended. No organomegaly noted, normoactive bowel sounds. Musculoskeletal: No edema, cyanosis, or clubbing. Neuro: Alert, answering all questions appropriately. Cranial nerves grossly intact. Skin: No   rashes or petechiae noted. Psych: Normal affect. Lymphatics: No cervical, calvicular, axillary or inguinal LAD.   LAB RESULTS:  Lab Results  Component Value Date   NA 142 03/15/2016   K 4.6 03/15/2016   CL 101 03/15/2016   CO2 24 03/15/2016   GLUCOSE 194 (H) 03/15/2016   BUN 15 03/15/2016   CREATININE 1.18 (H) 03/15/2016   CALCIUM 9.6 03/15/2016   PROT 7.0 03/15/2016   ALBUMIN 4.2 03/15/2016   AST 31 03/15/2016   ALT 30 03/15/2016   ALKPHOS 99 03/15/2016   BILITOT 0.4 03/15/2016   GFRNONAA 44 (L) 03/15/2016   GFRAA 51 (L) 03/15/2016    Lab Results  Component Value Date   WBC 7.7 08/03/2016   NEUTROABS 5.4 08/03/2016   HGB 9.7 (L) 08/03/2016   HCT 29.2 (L) 08/03/2016   MCV 67.4 (L) 08/03/2016   PLT 214 08/03/2016   Lab Results  Component Value Date   IRON 34 08/03/2016   TIBC 309 08/03/2016   IRONPCTSAT 11 08/03/2016   Lab Results  Component Value Date   FERRITIN 28 08/03/2016     STUDIES: No results found.  ASSESSMENT: Microcytic anemia  PLAN:    1. Microcytic anemia: Upon review of patient's chart, she was noted to have a hemoglobinopathy profile from 1999 consistent with hemoglobin C disease. This has been repeated and results are pending at time of dictation. If hemoglobin C is confirmed, this would give patient a persistent  microcytic anemia despite normal iron stores. No intervention would be needed. Patient will return to clinic in approximately 4 weeks to discuss her results.  Patient expressed understanding and was in agreement with this plan. She also understands that She can call clinic at any time with any questions, concerns, or complaints.   Lloyd Huger, MD   08/08/2016 11:20 PM

## 2016-08-03 ENCOUNTER — Inpatient Hospital Stay: Payer: Medicare Other

## 2016-08-03 ENCOUNTER — Inpatient Hospital Stay: Payer: Medicare Other | Attending: Oncology | Admitting: Oncology

## 2016-08-03 ENCOUNTER — Encounter: Payer: Self-pay | Admitting: Oncology

## 2016-08-03 DIAGNOSIS — I10 Essential (primary) hypertension: Secondary | ICD-10-CM | POA: Insufficient documentation

## 2016-08-03 DIAGNOSIS — Z79899 Other long term (current) drug therapy: Secondary | ICD-10-CM | POA: Insufficient documentation

## 2016-08-03 DIAGNOSIS — I495 Sick sinus syndrome: Secondary | ICD-10-CM | POA: Diagnosis not present

## 2016-08-03 DIAGNOSIS — M1990 Unspecified osteoarthritis, unspecified site: Secondary | ICD-10-CM | POA: Insufficient documentation

## 2016-08-03 DIAGNOSIS — E119 Type 2 diabetes mellitus without complications: Secondary | ICD-10-CM | POA: Diagnosis not present

## 2016-08-03 DIAGNOSIS — D509 Iron deficiency anemia, unspecified: Secondary | ICD-10-CM

## 2016-08-03 DIAGNOSIS — I480 Paroxysmal atrial fibrillation: Secondary | ICD-10-CM | POA: Diagnosis not present

## 2016-08-03 DIAGNOSIS — G4733 Obstructive sleep apnea (adult) (pediatric): Secondary | ICD-10-CM | POA: Insufficient documentation

## 2016-08-03 DIAGNOSIS — E785 Hyperlipidemia, unspecified: Secondary | ICD-10-CM | POA: Diagnosis not present

## 2016-08-03 LAB — CBC WITH DIFFERENTIAL/PLATELET
Basophils Absolute: 0 10*3/uL (ref 0–0.1)
Basophils Relative: 0 %
Eosinophils Absolute: 0.1 10*3/uL (ref 0–0.7)
Eosinophils Relative: 2 %
HCT: 29.2 % — ABNORMAL LOW (ref 35.0–47.0)
Hemoglobin: 9.7 g/dL — ABNORMAL LOW (ref 12.0–16.0)
Lymphocytes Relative: 20 %
Lymphs Abs: 1.5 10*3/uL (ref 1.0–3.6)
MCH: 22.4 pg — ABNORMAL LOW (ref 26.0–34.0)
MCHC: 33.2 g/dL (ref 32.0–36.0)
MCV: 67.4 fL — ABNORMAL LOW (ref 80.0–100.0)
Monocytes Absolute: 0.5 10*3/uL (ref 0.2–0.9)
Monocytes Relative: 7 %
Neutro Abs: 5.4 10*3/uL (ref 1.4–6.5)
Neutrophils Relative %: 71 %
Platelets: 214 10*3/uL (ref 150–440)
RBC: 4.33 MIL/uL (ref 3.80–5.20)
RDW: 16.9 % — ABNORMAL HIGH (ref 11.5–14.5)
WBC: 7.7 10*3/uL (ref 3.6–11.0)

## 2016-08-03 LAB — IRON AND TIBC
Iron: 34 ug/dL (ref 28–170)
Saturation Ratios: 11 % (ref 10.4–31.8)
TIBC: 309 ug/dL (ref 250–450)
UIBC: 275 ug/dL

## 2016-08-03 LAB — FERRITIN: Ferritin: 28 ng/mL (ref 11–307)

## 2016-08-03 NOTE — Progress Notes (Signed)
New evaluation for anemia. Complains of arthritis pain in knees today.

## 2016-08-08 DIAGNOSIS — H40153 Residual stage of open-angle glaucoma, bilateral: Secondary | ICD-10-CM | POA: Diagnosis not present

## 2016-08-09 LAB — HEMOGLOBINOPATHY EVALUATION
Hgb A2 Quant: 2.8 % (ref 0.7–3.1)
Hgb A: 68.6 % — ABNORMAL LOW (ref 94.0–98.0)
Hgb C: 28.6 % — ABNORMAL HIGH
Hgb F Quant: 0 % (ref 0.0–2.0)
Hgb S Quant: 0 %

## 2016-08-10 ENCOUNTER — Ambulatory Visit (INDEPENDENT_AMBULATORY_CARE_PROVIDER_SITE_OTHER): Payer: Medicare Other | Admitting: Podiatry

## 2016-08-10 DIAGNOSIS — L84 Corns and callosities: Secondary | ICD-10-CM | POA: Diagnosis not present

## 2016-08-10 DIAGNOSIS — M79676 Pain in unspecified toe(s): Secondary | ICD-10-CM

## 2016-08-10 DIAGNOSIS — E119 Type 2 diabetes mellitus without complications: Secondary | ICD-10-CM

## 2016-08-10 DIAGNOSIS — B351 Tinea unguium: Secondary | ICD-10-CM

## 2016-08-10 DIAGNOSIS — Q828 Other specified congenital malformations of skin: Secondary | ICD-10-CM

## 2016-08-10 NOTE — Patient Instructions (Signed)

## 2016-08-10 NOTE — Progress Notes (Signed)
SUBJECTIVE Patient with a history of diabetes mellitus presents to office today complaining of elongated, thickened nails. Pain while ambulating in shoes. Patient is unable to trim their own nails. Patient also has painful callus lesions bilaterally to plantar feet  Allergies  Allergen Reactions  . Diltiazem Other (See Comments)    Patient stated that she had bad dreams with this medication  . Metoprolol Other (See Comments)    Bad dreams Patient stated that she had suicidal thoughts while taking this medication.  . Levofloxacin In D5w Nausea Only and Nausea And Vomiting    OBJECTIVE General Patient is awake, alert, and oriented x 3 and in no acute distress. Derm Hyperkeratotic lesions noted to the plantar forefoot of the bilateral lower extremities underlying the first and fifth MPJs. Painful callus lesion also noted to the fifth digits bilaterally overlying the PIPJ joints. Skin is dry and supple bilateral. Negative open lesions or macerations. Remaining integument unremarkable. Nails are tender, long, thickened and dystrophic with subungual debris, consistent with onychomycosis, 1-5 bilateral. No signs of infection noted. Vasc  DP and PT pedal pulses palpable bilaterally. Temperature gradient within normal limits.  Neuro Epicritic and protective threshold sensation diminished bilaterally.  Musculoskeletal Exam No symptomatic pedal deformities noted bilateral. Muscular strength within normal limits.  ASSESSMENT 1. Diabetes Mellitus w/ peripheral neuropathy 2. Onychomycosis of nail due to dermatophyte bilateral 3. Bilateral callus formations fifth digits 4. Pain in foot bilateral 5. Poor keratoses bilateral feet  PLAN OF CARE Patient evaluated today. Instructed to maintain good pedal hygiene and foot care. Stressed importance of controlling blood sugar.  Mechanical debridement of nails 1-5 bilaterally performed using a nail nipper. Filed with dremel without incident.  Excisional  debridement of porokeratoses and callus formations bilaterally was performed using a chisel blade. All patient questions were answered. Return to clinic in 3 mos.    Edrick Kins, DPM

## 2016-08-19 ENCOUNTER — Other Ambulatory Visit: Payer: Self-pay | Admitting: Family Medicine

## 2016-08-21 ENCOUNTER — Other Ambulatory Visit: Payer: Self-pay

## 2016-08-22 MED ORDER — MOMETASONE FUROATE 0.1 % EX CREA: TOPICAL_CREAM | Freq: Every day | CUTANEOUS | 0 refills | Status: DC

## 2016-08-26 ENCOUNTER — Other Ambulatory Visit: Payer: Self-pay | Admitting: Family Medicine

## 2016-08-29 NOTE — Progress Notes (Signed)
[name] [name]  Telephone:(336) [phone] Fax:(336) [phone]  ID: [name] [name] OB: 04-Apr-1935  MR#: [id number]  TDV#:[id number]  Patient Care Team: Roselee Nova, MD as PCP - General (Family Medicine) Deboraha Sprang, MD as Consulting Physician (Cardiology) Minna Merritts, MD as Consulting Physician (Cardiology)  CHIEF COMPLAINT: Hemoglobin C disease  INTERVAL HISTORY: Patient returns to clinic today for further evaluation and discussion of her laboratory results. She continues to feel well and is asymptomatic. She does not complain of weakness or fatigue. She has good appetite and denies weight loss. She has no neurologic complaints. She denies any recent fevers or illnesses. She has no chest pain or shortness of breath. She denies any nausea, vomiting, constipation, or diarrhea. She has no melena or hematochezia. She has no urinary complaints. Patient feels at her baseline and offers no specific complaints today.  REVIEW OF SYSTEMS:   Review of Systems  Constitutional: Negative.  Negative for fever, malaise/fatigue and weight loss.  Respiratory: Negative.  Negative for cough and shortness of breath.   Cardiovascular: Negative for chest pain.  Gastrointestinal: Negative.  Negative for abdominal pain, blood in stool and melena.  Genitourinary: Negative.   Musculoskeletal: Negative.   Neurological: Negative.  Negative for weakness.  Psychiatric/Behavioral: Negative.  The patient is not nervous/anxious.     As per HPI. Otherwise, a complete review of systems is negative.  PAST MEDICAL HISTORY: Past Medical History:  Diagnosis Date  . 1St degree AV block    a. PR 400 msec with Apacing  . Anemia   . Anxiety   . Arthritis    a. shoulders.  . Chest pain    a. 2004 Cath:  reportedly nl;  b. 09/2013 Neg MV.  . Chronic diastolic heart failure ([place])    a. 09/2013 Echo: EF 50-55%, mild LVH, mild to mod MR/TR.  Marland Kitchen Esophageal reflux   . H/O hiatal hernia   . History  of UTI   . Hyperlipidemia   . Morbid obesity ([place])   . OSA on CPAP   . PAF (paroxysmal atrial fibrillation) ([place])   . SSS (sick sinus syndrome) ([place])    a. 2008 s/p MDT PPM;  b. 11/2013 Gen change- MDT ADDRL1 Adapta DC PPM, ser # [id number] H.  . Symptomatic PVCs   . Syncope and collapse    a. Felt to be vasovagal.  . Type II diabetes mellitus ([place])   . Unspecified essential hypertension   . Unspecified glaucoma     PAST SURGICAL HISTORY: Past Surgical History:  Procedure Laterality Date  . DILATION AND CURETTAGE OF UTERUS    . INCISION AND DRAINAGE OF WOUND Right 03/1998   "leg; it was like a boil" (12/01/2013)  . INSERT / REPLACE / REMOVE PACEMAKER  2008; 12/01/2013   MDT ADDRL1 pacemaker - gen change by Dr Caryl Comes 12/01/2013  . [facility]  [facility]   [facility]  . PERMANENT PACEMAKER GENERATOR CHANGE N/A 12/01/2013   Procedure: PERMANENT PACEMAKER GENERATOR CHANGE;  Surgeon: Deboraha Sprang, MD;  [facility] CATH LAB;  Service: Cardiovascular;  Laterality: N/A;  . VAGINAL HYSTERECTOMY  1970    FAMILY HISTORY: Family History  Problem Relation Age of Onset  . Heart disease Father   . Rheum arthritis Father   . Allergies Father   . Liver cancer Mother   . Pancreatic cancer Mother        ADVANCED DIRECTIVES (Y/N):  N   HEALTH MAINTENANCE: Social History  Substance Use Topics  .  Smoking status: Former Smoker    Packs/day: 0.50    Years: 34.00    Types: Cigarettes    Quit date: 08/15/1989  . Smokeless tobacco: Never Used  . Alcohol use No     Colonoscopy:  PAP:  Bone density:  Lipid panel:  Allergies  Allergen Reactions  . Diltiazem Other (See Comments)    Patient stated that she had bad dreams with this medication  . Metoprolol Other (See Comments)    Bad dreams Patient stated that she had suicidal thoughts while taking this medication.  . Levofloxacin In D5w Nausea Only and Nausea And Vomiting    Current Outpatient Prescriptions  Medication Sig Dispense Refill    . ALPRAZolam (XANAX) 0.5 MG tablet Take 1 tablet (0.5 mg total) by mouth daily as needed. 90 tablet 0  . amLODipine (NORVASC) 10 MG tablet TAKE 1 TABLET (10 MG TOTAL) BY MOUTH DAILY. 90 tablet 0  . atorvastatin (LIPITOR) 20 MG tablet TAKE 1 TABLET (20 MG TOTAL) BY MOUTH DAILY. 90 tablet 0  . Brinzolamide-Brimonidine (SIMBRINZA) 1-0.2 % SUSP Place 1 drop into both eyes 2 (two) times daily. 1 Bottle 1  . carvedilol (COREG) 6.25 MG tablet take 1.5 tablets twice a day  11  . esomeprazole (NEXIUM) 40 MG capsule TAKE 1 CAPSULE (40 MG TOTAL) BY MOUTH DAILY. 90 capsule 0  . furosemide (LASIX) 20 MG tablet Take 1 tablet (20 mg total) by mouth daily. 90 tablet 0  . glipiZIDE (GLUCOTROL) 10 MG tablet TAKE 1 TABLET (10 MG TOTAL) BY MOUTH 2 (TWO) TIMES DAILY BEFORE A MEAL. 135 tablet 0  . metFORMIN (GLUCOPHAGE-XR) 500 MG 24 hr tablet TAKE 1 TABLET (500 MG TOTAL) BY MOUTH 2 (TWO) TIMES DAILY. 180 tablet 0  . mometasone (ELOCON) 0.1 % cream Apply topically daily. Apply to ear as directed two times a day for 10 days as needed 45 g 0  . Multiple Vitamins-Minerals (CENTRUM SILVER PO) Take by mouth daily.    Marland Kitchen olmesartan (BENICAR) 40 MG tablet TAKE 1 TABLET (40 MG TOTAL) BY MOUTH DAILY. 90 tablet 0  . TRADJENTA 5 MG TABS tablet TAKE 1 TABLET (5 MG TOTAL) BY MOUTH DAILY. 30 tablet 2  . travoprost, benzalkonium, (TRAVATAN) 0.004 % ophthalmic solution Place 1 drop into both eyes at bedtime.     Marland Kitchen warfarin (COUMADIN) 3 MG tablet TAKE 1-1.5 TABLETS DAILY AT 6 PM. TAKE 1 TAB ON TUES, THURS, SAT, SUN. TAKE 4.5 MG ON MON, WED, FRI 100 tablet 3   No current facility-administered medications for this visit.     OBJECTIVE: Vitals:   08/30/16 0946  BP: 127/74  Pulse: 70  Resp: 18  Temp: 97 F (36.1 C)     Body mass index is 40.04 kg/m.    ECOG FS:0 - Asymptomatic  General: Well-developed, well-nourished, no acute distress. Eyes: Pink conjunctiva, anicteric sclera. Lungs: Clear to auscultation bilaterally. Heart:  Regular rate and rhythm. No rubs, murmurs, or gallops. Abdomen: Soft, nontender, nondistended. No organomegaly noted, normoactive bowel sounds. Musculoskeletal: No edema, cyanosis, or clubbing. Neuro: Alert, answering all questions appropriately. Cranial nerves grossly intact. Skin: No rashes or petechiae noted. Psych: Normal affect.   LAB RESULTS:  Lab Results  Component Value Date   NA 142 03/15/2016   K 4.6 03/15/2016   CL 101 03/15/2016   CO2 24 03/15/2016   GLUCOSE 194 (H) 03/15/2016   BUN 15 03/15/2016   CREATININE 1.18 (H) 03/15/2016   CALCIUM 9.6 03/15/2016   PROT  7.0 03/15/2016   ALBUMIN 4.2 03/15/2016   AST 31 03/15/2016   ALT 30 03/15/2016   ALKPHOS 99 03/15/2016   BILITOT 0.4 03/15/2016   GFRNONAA 44 (L) 03/15/2016   GFRAA 51 (L) 03/15/2016    Lab Results  Component Value Date   WBC 7.7 08/03/2016   NEUTROABS 5.4 08/03/2016   HGB 9.7 (L) 08/03/2016   HCT 29.2 (L) 08/03/2016   MCV 67.4 (L) 08/03/2016   PLT 214 08/03/2016   Lab Results  Component Value Date   IRON 34 08/03/2016   TIBC 309 08/03/2016   IRONPCTSAT 11 08/03/2016   Lab Results  Component Value Date   FERRITIN 28 08/03/2016     STUDIES: No results found.  ASSESSMENT: Hemoglobin C disease.  PLAN:    1. Hemoglobin C disease: Confirmed by hemoglobin electrophoresis. Patient's iron stores are within normal limits. Patient's hemoglobin is likely her baseline. Hemoglobin C is clinically insignificant, but patient will always have a persistent microcytic anemia. No intervention is needed. No further follow-up is necessary.   Patient expressed understanding and was in agreement with this plan. She also understands that She can call clinic at any time with any questions, concerns, or complaints.   Lloyd Huger, MD   08/30/2016 10:28 AM

## 2016-08-30 ENCOUNTER — Inpatient Hospital Stay: Payer: Medicare Other | Attending: Oncology | Admitting: Oncology

## 2016-08-30 VITALS — BP 127/74 | HR 70 | Temp 97.0°F | Resp 18 | Wt 240.6 lb

## 2016-08-30 DIAGNOSIS — E119 Type 2 diabetes mellitus without complications: Secondary | ICD-10-CM | POA: Diagnosis not present

## 2016-08-30 DIAGNOSIS — M1990 Unspecified osteoarthritis, unspecified site: Secondary | ICD-10-CM | POA: Diagnosis not present

## 2016-08-30 DIAGNOSIS — I495 Sick sinus syndrome: Secondary | ICD-10-CM | POA: Diagnosis not present

## 2016-08-30 DIAGNOSIS — F419 Anxiety disorder, unspecified: Secondary | ICD-10-CM | POA: Diagnosis not present

## 2016-08-30 DIAGNOSIS — Z79899 Other long term (current) drug therapy: Secondary | ICD-10-CM

## 2016-08-30 DIAGNOSIS — D509 Iron deficiency anemia, unspecified: Secondary | ICD-10-CM | POA: Insufficient documentation

## 2016-08-30 DIAGNOSIS — Z87891 Personal history of nicotine dependence: Secondary | ICD-10-CM | POA: Diagnosis not present

## 2016-08-30 DIAGNOSIS — I5032 Chronic diastolic (congestive) heart failure: Secondary | ICD-10-CM | POA: Insufficient documentation

## 2016-08-30 DIAGNOSIS — D582 Other hemoglobinopathies: Secondary | ICD-10-CM | POA: Diagnosis not present

## 2016-08-30 DIAGNOSIS — E785 Hyperlipidemia, unspecified: Secondary | ICD-10-CM | POA: Insufficient documentation

## 2016-08-30 DIAGNOSIS — I10 Essential (primary) hypertension: Secondary | ICD-10-CM | POA: Insufficient documentation

## 2016-08-30 DIAGNOSIS — K219 Gastro-esophageal reflux disease without esophagitis: Secondary | ICD-10-CM | POA: Diagnosis not present

## 2016-08-30 NOTE — Progress Notes (Signed)
States is feeling tired today but pt relates symptom to CHF.

## 2016-09-06 ENCOUNTER — Other Ambulatory Visit: Payer: Self-pay | Admitting: Family Medicine

## 2016-09-06 ENCOUNTER — Telehealth: Payer: Self-pay | Admitting: Cardiovascular Disease

## 2016-09-06 DIAGNOSIS — Z9989 Dependence on other enabling machines and devices: Principal | ICD-10-CM

## 2016-09-06 DIAGNOSIS — G4733 Obstructive sleep apnea (adult) (pediatric): Secondary | ICD-10-CM

## 2016-09-06 NOTE — Telephone Encounter (Signed)
Patient states that she is still waking up around 3AM every morning with rapid heart beat. She states that it is so bad sometimes that it pounds hard in her ears. She is already taking coreg 6.25 mg 2 tablets in the AM and 2 tablets in the PM and it is still not helping. She states that she has tried different medications but nothing seems to be helping. Let her know that I would send this to Dr. Rockey Situ for his recommendations and someone would be in touch with her.

## 2016-09-06 NOTE — Telephone Encounter (Signed)
Pt calling having some issues with her medications   Pt c/o medication issue:  1. Name of Medication:  Coreg  2. How are you currently taking this medication (dosage and times per day)?  Taking 2 in the am and 2 in the pm  3. Are you having a reaction (difficulty breathing--STAT)?  No 4. What is your medication issue?  She wakes up every night 3-330 am  Can't go back to sleep Due to her heart beat going so fast. Feels like it's in her ears Every night this seems to be happening.

## 2016-09-07 ENCOUNTER — Other Ambulatory Visit: Payer: Self-pay | Admitting: Family Medicine

## 2016-09-10 NOTE — Telephone Encounter (Signed)
Symptoms concerning for sleep apnea. Ever been tested? Does she want to be tested? Could try coreg 25 mg at night 12.5 in the AM Could send in 12.5 mg doses

## 2016-09-11 ENCOUNTER — Other Ambulatory Visit: Payer: Self-pay | Admitting: Cardiovascular Disease

## 2016-09-11 NOTE — Telephone Encounter (Signed)
°*  STAT* If patient is at the pharmacy, call can be transferred to refill team.   1. Which medications need to be refilled? (please list name of each medication and dose if known) Carvadilol 2 in the am and 2 in the PM  2. Which pharmacy/location (including street and city if local pharmacy) is medication to be sent to? [facility] on [address]  3. Do they need a 30 day or 90 day supply? 90 day

## 2016-09-11 NOTE — Telephone Encounter (Signed)
Left message for pt to call back  °

## 2016-09-11 NOTE — Telephone Encounter (Signed)
Please ask how she is taking her Carvedilol.

## 2016-09-12 ENCOUNTER — Ambulatory Visit: Payer: Medicaid Other | Admitting: Pulmonary Disease

## 2016-09-12 MED ORDER — CARVEDILOL 12.5 MG PO TABS: 12.5000 mg | ORAL_TABLET | ORAL | 6 refills | Status: DC

## 2016-09-12 NOTE — Telephone Encounter (Signed)
Left message for pt to call back  °

## 2016-09-12 NOTE — Telephone Encounter (Signed)
Spoke w/ pt.  She reports that she saw Dr. Ubaldo Glassing twice and he prescribed her coreg 6.25 mg 1.5 tabs, but she has been taking 2 pills BID. Advised her of Dr. Donivan Scull recommendation.  She is agreeable to this and appreciative of a new rx. She has not had a SS in some time - she thinks it was done w/ Dr. Raul Del. She has a CPAP machine but she does not think the settings are correct. She would appreciate an appt w/ [facility] here in [place], as she does not want to drive to [place]. Advised her that I am placing referral and scheduling will call her to set this up. She is appreciative and will call back if we can be of further assistance.

## 2016-09-14 ENCOUNTER — Telehealth: Payer: Self-pay | Admitting: Cardiovascular Disease

## 2016-09-14 ENCOUNTER — Other Ambulatory Visit: Payer: Self-pay | Admitting: Family Medicine

## 2016-09-14 NOTE — Telephone Encounter (Signed)
Please see previous phone note.  

## 2016-09-14 NOTE — Telephone Encounter (Signed)
If unable to tolerate higher dose coreg, would back to original dose Can we make appt with pulmonary to sleep apnea follow up Also needs follow up with pacer clinic to download and discussion of arrhythmia overnight

## 2016-09-14 NOTE — Telephone Encounter (Signed)
Pt calling stating that she's was taking  6.25 of carvedilol  Now is taking 12.5 mg just one in the morning And then take 2 at night   She's been doing this the past two days She states her stomach is very irritated  She is about to drink a glass of vinegar   She states she can't take two a night  It is really hard on her Still having tachycardia  Pounding in her ears It feels

## 2016-09-14 NOTE — Telephone Encounter (Signed)
[name] [name] (3:56 PM)   Pt calling stating that she's was taking  6.25 of carvedilol  Now is taking 12.5 mg just one in the morning And then take 2 at night   She's been doing this the past two days She states her stomach is very irritated  She is about to drink a glass of vinegar   She states she can't take two a night  It is really hard on her Still having tachycardia  Pounding in her ears It feels

## 2016-09-15 ENCOUNTER — Other Ambulatory Visit: Payer: Self-pay | Admitting: Family Medicine

## 2016-09-15 NOTE — Telephone Encounter (Signed)
Spoke w/ pt.  Advised her of Dr. Donivan Scull recommendation.  She is concerned about her rapid heartbeat & pounding in her ears.  She has not checked her HR while symptomatic, BP 145/72, 131/74, 147/77. She reports that sx are primarily in the middle of the night. She will contact Dr. Tanna Furry office and see if she can send a remote transmission.  Call transferred to the front desk to schedule pulmonology appt to discuss repeat SS.  Asked pt to call back is sx recur.

## 2016-09-18 ENCOUNTER — Ambulatory Visit (INDEPENDENT_AMBULATORY_CARE_PROVIDER_SITE_OTHER): Payer: Medicare Other | Admitting: Family Medicine

## 2016-09-18 VITALS — BP 108/58 | HR 72 | Temp 97.5°F | Ht 63.75 in | Wt 239.2 lb

## 2016-09-18 DIAGNOSIS — Z0000 Encounter for general adult medical examination without abnormal findings: Secondary | ICD-10-CM

## 2016-09-18 DIAGNOSIS — Z23 Encounter for immunization: Secondary | ICD-10-CM | POA: Diagnosis not present

## 2016-09-18 NOTE — Progress Notes (Signed)
Subjective:   [name] [name] is a 81 y.o. female who presents for Medicare Annual (Subsequent) preventive examination.  Review of Systems:  N/A Cardiac Risk Factors include: obesity (BMI >30kg/m2);diabetes mellitus;advanced age (>34men, >43 women);dyslipidemia     Objective:     Vitals: BP (!) 108/58   Pulse 72   Temp 97.5 F (36.4 C) (Oral)   Ht 5' 3.75" (1.619 m)   Wt 239 lb 4 oz (108.5 kg)   BMI 41.39 kg/m   Body mass index is 41.39 kg/m.   Tobacco History  Smoking Status  . Former Smoker  . Packs/day: 0.50  . Years: 34.00  . Types: Cigarettes  . Quit date: 08/15/1989  Smokeless Tobacco  . Never Used     Counseling given: No   Past Medical History:  Diagnosis Date  . 1st degree AV block    a. PR 400 msec with Apacing  . Anemia   . Anxiety   . Arthritis    a. shoulders.  . Chest pain    a. 2004 Cath:  reportedly nl;  b. 09/2013 Neg MV.  . Chronic diastolic heart failure ([place])    a. 09/2013 Echo: EF 50-55%, mild LVH, mild to mod MR/TR.  Marland Kitchen Esophageal reflux   . H/O hiatal hernia   . History of UTI   . Hyperlipidemia   . Morbid obesity ([place])   . OSA on CPAP   . PAF (paroxysmal atrial fibrillation) ([place])   . SSS (sick sinus syndrome) ([place])    a. 2008 s/p MDT PPM;  b. 11/2013 Gen change- MDT ADDRL1 Adapta DC PPM, ser # [id number] H.  . Symptomatic PVCs   . Syncope and collapse    a. Felt to be vasovagal.  . Type II diabetes mellitus ([place])   . Unspecified essential hypertension   . Unspecified glaucoma(365.9)    Past Surgical History:  Procedure Laterality Date  . DILATION AND CURETTAGE OF UTERUS    . INCISION AND DRAINAGE OF WOUND Right 03/1998   "leg; it was like a boil" (12/01/2013)  . INSERT / REPLACE / REMOVE PACEMAKER  2008; 12/01/2013   MDT ADDRL1 pacemaker - gen change by Dr Caryl Comes 12/01/2013  . LUNG BIOPSY  2010   [facility]  . PERMANENT PACEMAKER GENERATOR CHANGE N/A 12/01/2013   Procedure: PERMANENT PACEMAKER GENERATOR CHANGE;  Surgeon: Deboraha Sprang, MD;  Location[facility] CATH LAB;  Service: Cardiovascular;  Laterality: N/A;  . VAGINAL HYSTERECTOMY  1970   Family History  Problem Relation Age of Onset  . Heart disease Father   . Rheum arthritis Father   . Allergies Father   . Liver cancer Mother   . Pancreatic cancer Mother    History  Sexual Activity  . Sexual activity: No    Outpatient Encounter Prescriptions as of 09/18/2016  Medication Sig  . acetaminophen (TYLENOL) 500 MG tablet Take 500 mg by mouth every 6 (six) hours as needed.  . ALPRAZolam (XANAX) 0.5 MG tablet Take 1 tablet (0.5 mg total) by mouth daily as needed.  Marland Kitchen amLODipine (NORVASC) 10 MG tablet TAKE 1 TABLET (10 MG TOTAL) BY MOUTH DAILY.  Marland Kitchen atorvastatin (LIPITOR) 20 MG tablet TAKE 1 TABLET (20 MG TOTAL) BY MOUTH DAILY.  . Brinzolamide-Brimonidine (SIMBRINZA) 1-0.2 % SUSP Place 1 drop into both eyes 2 (two) times daily.  . carvedilol (COREG) 12.5 MG tablet Take 1 tablet (12.5 mg total) by mouth as directed. Take 1 tab in the morning with breakfast, 2 tabs in  the evening before bed. (Patient taking differently: Take 12.5 mg by mouth as directed. Take 1 tab in the morning with breakfast, 2 tabs in the evening before bed.)  . esomeprazole (NEXIUM) 40 MG capsule TAKE 1 CAPSULE (40 MG TOTAL) BY MOUTH DAILY.  . furosemide (LASIX) 20 MG tablet Take 1 tablet (20 mg total) by mouth daily.  Marland Kitchen glipiZIDE (GLUCOTROL) 10 MG tablet TAKE 1 TABLET (10 MG TOTAL) BY MOUTH 2 (TWO) TIMES DAILY BEFORE A MEAL.  . metFORMIN (GLUCOPHAGE-XR) 500 MG 24 hr tablet TAKE 1 TABLET (500 MG TOTAL) BY MOUTH 2 (TWO) TIMES DAILY. (Patient taking differently: TAKE 1 TABLET (500 MG TOTAL) BY MOUTH 2 (TWO) TIMES DAILY.)  . mometasone (ELOCON) 0.1 % cream Apply topically daily. Apply to ear as directed two times a day for 10 days as needed  . Multiple Vitamins-Minerals (CENTRUM SILVER PO) Take by mouth daily.  Marland Kitchen olmesartan (BENICAR) 40 MG tablet TAKE 1 TABLET (40 MG TOTAL) BY MOUTH DAILY.  Marland Kitchen TRADJENTA 5  MG TABS tablet TAKE 1 TABLET (5 MG TOTAL) BY MOUTH DAILY.  Marland Kitchen travoprost, benzalkonium, (TRAVATAN) 0.004 % ophthalmic solution Place 1 drop into both eyes at bedtime.   Marland Kitchen warfarin (COUMADIN) 3 MG tablet TAKE 1-1.5 TABLETS DAILY AT 6 PM. TAKE 1 TAB ON TUES, THURS, SAT, SUN. TAKE 4.5 MG ON MON, WED, FRI   No facility-administered encounter medications on file as of 09/18/2016.     Activities of Daily Living In your present state of health, do you have any difficulty performing the following activities: 09/18/2016 03/23/2016  Hearing? N N  Vision? N N  Difficulty concentrating or making decisions? N N  Walking or climbing stairs? N Y  Dressing or bathing? N N  Doing errands, shopping? N N  Preparing Food and eating ? N -  Using the Toilet? N -  In the past six months, have you accidently leaked urine? N -  Do you have problems with loss of bowel control? N -  Managing your Medications? N -  Managing your Finances? N -  Housekeeping or managing your Housekeeping? N -  Some recent data might be hidden    Patient Care Team: Roselee Nova, MD as PCP - General (Family Medicine) Minna Merritts, MD as Consulting Physician (Cardiology) Anthonette Legato, MD as Consulting Physician (Nephrology) Coull Lance, MD as Consulting Physician (Cardiology) Crista Curb. Gloriann Loan, MD (Ophthalmology) Edrick Kins, DPM as Consulting Physician (Podiatry)    Assessment:     Exercise Activities and Dietary recommendations Current Exercise Habits: Home exercise routine, Type of exercise: strength training/weights;stretching;Other - see comments (cardio dance), Time (Minutes): > 60, Frequency (Times/Week): 3, Weekly Exercise (Minutes/Week): 0, Intensity: Moderate, Exercise limited by: None identified  Goals    . Increase water intake          Starting 09/18/16, I will continue drinking 6-8 glasses of water a day.      Fall Risk Fall Risk  09/18/2016 03/23/2016 03/15/2016 12/16/2015 10/14/2015  Falls in  the past year? No No No No No   Depression Screen PHQ 2/9 Scores 09/18/2016 03/23/2016 03/15/2016 12/16/2015  PHQ - 2 Score 0 0 0 0     Cognitive Testing MMSE - Mini Mental State Exam 09/18/2016  Orientation to time 5  Orientation to Place 5  Registration 3  Attention/ Calculation 4  Recall 3  Language- name 2 objects 1  Language- repeat 1  Language- follow 3 step command 0  Language-  read & follow direction 0  Write a sentence 0  Copy design 0  Total score 22    Immunization History  Administered Date(s) Administered  . Influenza,inj,Quad PF,36+ Mos 08/10/2015  . Influenza-Unspecified 09/03/2013, 07/04/2014   Screening Tests Health Maintenance  Topic Date Due  . FOOT EXAM  09/13/2016  . DEXA SCAN  12/03/2016 (Originally 11/23/2002)  . ZOSTAVAX  09/18/2017 (Originally 11/23/1997)  . PNA vac Low Risk Adult (1 of 2 - PCV13) 09/18/2017 (Originally 11/23/2002)  . HEMOGLOBIN A1C  01/28/2017  . OPHTHALMOLOGY EXAM  07/04/2017  . TETANUS/TDAP  09/18/2026  . INFLUENZA VACCINE  Completed      Plan:    I have personally reviewed and addressed the Medicare Annual Wellness questionnaire and have noted the following in the patient's chart:  A. Medical and social history B. Use of alcohol, tobacco or illicit drugs  C. Current medications and supplements D. Functional ability and status E.  Nutritional status F.  Physical activity G. Advance directives H. List of other physicians I.  Hospitalizations, surgeries, and ER visits in previous 12 months J.  [place] such as hearing and vision if needed, cognitive and depression L. Referrals and appointments - none  In addition, I have reviewed and discussed with patient certain preventive protocols, quality metrics, and best practice recommendations. A written personalized care plan for preventive services as well as general preventive health recommendations were provided to patient.  See attached scanned questionnaire  for additional information.   Signed,  Fabio Neighbors, LPN Nurse Health Advisor

## 2016-09-18 NOTE — Patient Instructions (Signed)
[name] [name] , Thank you for taking time to come for your Medicare Wellness Visit. I appreciate your ongoing commitment to your health goals. Please review the following plan we discussed and let me know if I can assist you in the future.   These are the goals we discussed: Goals    . Increase water intake          Starting 09/18/16, I will continue drinking 6-8 glasses of water a day.       This is a list of the screening recommended for you and due dates:  Health Maintenance  Topic Date Due  . Complete foot exam   09/13/2016  . DEXA scan (bone density measurement)  12/03/2016*  . Shingles Vaccine  09/18/2017*  . Pneumonia vaccines (1 of 2 - PCV13) 09/18/2017*  . Hemoglobin A1C  01/28/2017  . Eye exam for diabetics  07/04/2017  . Tetanus Vaccine  09/18/2026  . Flu Shot  Completed  *Topic was postponed. The date shown is not the original due date.   Preventive Care for Adults  A healthy lifestyle and preventive care can promote health and wellness. Preventive health guidelines for adults include the following key practices.  . A routine yearly physical is a good way to check with your health care provider about your health and preventive screening. It is a chance to share any concerns and updates on your health and to receive a thorough exam.  . Visit your dentist for a routine exam and preventive care every 6 months. Brush your teeth twice a day and floss once a day. Good oral hygiene prevents tooth decay and gum disease.  . The frequency of eye exams is based on your age, health, family medical history, use  of contact lenses, and other factors. Follow your health care provider's ecommendations for frequency of eye exams.  . Eat a healthy diet. Foods like vegetables, fruits, whole grains, low-fat dairy products, and lean protein foods contain the nutrients you need without too many calories. Decrease your intake of foods high in solid fats, added sugars, and salt. Eat the right  amount of calories for you. Get information about a proper diet from your health care provider, if necessary.  . Regular physical exercise is one of the most important things you can do for your health. Most adults should get at least 150 minutes of moderate-intensity exercise (any activity that increases your heart rate and causes you to sweat) each week. In addition, most adults need muscle-strengthening exercises on 2 or more days a week.  Silver Sneakers may be a benefit available to you. To determine eligibility, you may visit the website: www.silversneakers.com or contact program at [phone] Mon-Fri between 8AM-8PM.   . Maintain a healthy weight. The body mass index (BMI) is a screening tool to identify possible weight problems. It provides an estimate of body fat based on height and weight. Your health care provider can find your BMI and can help you achieve or maintain a healthy weight.   For adults 20 years and older: ? A BMI below 18.5 is considered underweight. ? A BMI of 18.5 to 24.9 is normal. ? A BMI of 25 to 29.9 is considered overweight. ? A BMI of 30 and above is considered obese.   . Maintain normal blood lipids and cholesterol levels by exercising and minimizing your intake of saturated fat. Eat a balanced diet with plenty of fruit and vegetables. Blood tests for lipids and cholesterol should begin at age  20 and be repeated every 5 years. If your lipid or cholesterol levels are high, you are over 50, or you are at high risk for heart disease, you may need your cholesterol levels checked more frequently. Ongoing high lipid and cholesterol levels should be treated with medicines if diet and exercise are not working.  . If you smoke, find out from your health care provider how to quit. If you do not use tobacco, please do not start.  . If you choose to drink alcohol, please do not consume more than 2 drinks per day. One drink is considered to be 12 ounces (355 mL) of beer, 5  ounces (148 mL) of wine, or 1.5 ounces (44 mL) of liquor.  . If you are 8-53 years old, ask your health care provider if you should take aspirin to prevent strokes.  . Use sunscreen. Apply sunscreen liberally and repeatedly throughout the day. You should seek shade when your shadow is shorter than you. Protect yourself by wearing long sleeves, pants, a wide-brimmed hat, and sunglasses year round, whenever you are outdoors.  . Once a month, do a whole body skin exam, using a mirror to look at the skin on your back. Tell your health care provider of new moles, moles that have irregular borders, moles that are larger than a pencil eraser, or moles that have changed in shape or color.

## 2016-09-20 ENCOUNTER — Other Ambulatory Visit: Payer: Self-pay | Admitting: Family Medicine

## 2016-09-27 NOTE — Progress Notes (Signed)
* [facility]     Assessment and Plan:  OSA --Continues to have episodes of waking at night with difficulty breathing, uncertain if this may due to inadequately treated OSA.  --Will change auto-set pressure to 10-20; if not improved will consider sending patient for a CPAP titration study if covered.   RLS -Appears adequately controlled at this time.  Insomnia. -Sleep maintenance type, patient wakes up at around 3 AM, sometimes of pounding in her ears and rapid heartbeat. Cardiology workup has been unrevealing. -Continue on Xanax 0.25 mg daily at bedtime.   Date: 09/27/2016  MRN# [id number] EDYTH GLOMB [birth year]   [name] is a 81 y.o. old female seen in follow up for chief complaint of  Chief Complaint  Patient presents with  . sleep consult    former VS pt. currently wears cpap 4-6hr nightly. pt states around 3am she wakes up and unable to go back to sleep. DME:[facility]     HPI:   Patient is a 81 yo female with history of OSA and RLS,  Afib, SSS, HTN, DM, GERD. She was previously seen in [place], and is interested in transferring her care down here. Patient notes since he typically goes to bed between 10 and 11 PM, she wakes up to 2 and 4 AM. She usually takes a medication to help her fall asleep, she gets out of bed at 6 AM.  She notes that she is waking with a rapid heart beat with pounding in her ears. She takes a xanax 0.25 mg (half of a 0.5 mg tab) at bed time. If she does not fall asleep she will take the other half, this happens about twice per week but she still wakes up at the same time. She sees a cardiologist and has used a holter monitor but nothing was found. She denies anxiety at those times.   Review of download data for the month of October 2017: Average usage is 6 hours 40 minutes, used on 93% of days. AutoSet 5-20. Residual AHI is 1.4, 95th percentile pressure was 11.2.  PSG 10/24/13 >> AHI 7 Auto CPAP 05/10/15 to 06/08/15 >> used on  29 of 30 nights with average 6 hrs and 41 min.  Average AHI is 1.7 with median CPAP 8 cm H2O and 95 th percentile CPAP 12 cm H20.   PMhx >> Paroxymal atrial fibrillation, SSS s/p PM, HTN, Diastolic CHF, HLD, DM, HH, GERD, Glaucoma  Medication:   Outpatient Encounter Prescriptions as of 10/06/2016  Medication Sig  . acetaminophen (TYLENOL) 500 MG tablet Take 500 mg by mouth every 6 (six) hours as needed.  . ALPRAZolam (XANAX) 0.5 MG tablet Take 1 tablet (0.5 mg total) by mouth daily as needed.  Marland Kitchen amLODipine (NORVASC) 10 MG tablet TAKE 1 TABLET (10 MG TOTAL) BY MOUTH DAILY.  Marland Kitchen atorvastatin (LIPITOR) 20 MG tablet TAKE 1 TABLET (20 MG TOTAL) BY MOUTH DAILY.  . Brinzolamide-Brimonidine (SIMBRINZA) 1-0.2 % SUSP Place 1 drop into both eyes 2 (two) times daily.  . carvedilol (COREG) 12.5 MG tablet Take 1 tablet (12.5 mg total) by mouth as directed. Take 1 tab in the morning with breakfast, 2 tabs in the evening before bed. (Patient taking differently: Take 12.5 mg by mouth as directed. Take 1 tab in the morning with breakfast, 2 tabs in the evening before bed.)  . esomeprazole (NEXIUM) 40 MG capsule TAKE 1 CAPSULE (40 MG TOTAL) BY MOUTH DAILY.  . furosemide (LASIX) 20 MG tablet Take  1 tablet (20 mg total) by mouth daily.  Marland Kitchen glipiZIDE (GLUCOTROL) 10 MG tablet TAKE 1 TABLET (10 MG TOTAL) BY MOUTH 2 (TWO) TIMES DAILY BEFORE A MEAL.  . metFORMIN (GLUCOPHAGE-XR) 500 MG 24 hr tablet TAKE 1 TABLET (500 MG TOTAL) BY MOUTH 2 (TWO) TIMES DAILY. (Patient taking differently: TAKE 1 TABLET (500 MG TOTAL) BY MOUTH 2 (TWO) TIMES DAILY.)  . mometasone (ELOCON) 0.1 % cream Apply topically daily. Apply to ear as directed two times a day for 10 days as needed  . Multiple Vitamins-Minerals (CENTRUM SILVER PO) Take by mouth daily.  Marland Kitchen olmesartan (BENICAR) 40 MG tablet TAKE 1 TABLET (40 MG TOTAL) BY MOUTH DAILY.  Marland Kitchen TRADJENTA 5 MG TABS tablet TAKE 1 TABLET (5 MG TOTAL) BY MOUTH DAILY.  Marland Kitchen travoprost, benzalkonium, (TRAVATAN)  0.004 % ophthalmic solution Place 1 drop into both eyes at bedtime.   Marland Kitchen warfarin (COUMADIN) 3 MG tablet TAKE 1-1.5 TABLETS DAILY AT 6 PM. TAKE 1 TAB ON TUES, THURS, SAT, SUN. TAKE 4.5 MG ON MON, WED, FRI   No facility-administered encounter medications on file as of 10/06/2016.      Allergies:  Diltiazem; Metoprolol; and Levofloxacin in d5w  Review of Systems: Gen:  Denies  fever, sweats. HEENT: Denies blurred vision. Cvc:  No dizziness, chest pain or heaviness Resp:   Denies cough or sputum porduction. Gi: Denies swallowing difficulty, stomach pain.  Gu:  Denies bladder incontinence, burning urine Ext:   No Joint pain, stiffness. Skin: No skin rash, easy bruising. Endoc:  No polyuria, polydipsia. Psych: No depression, insomnia. Other:  All other systems were reviewed and found to be negative other than what is mentioned in the HPI.   Physical Examination:   VS: BP 132/74 (BP Location: Left Arm, Cuff Size: Normal)   Pulse 83   Ht 5\' 5"  (1.651 m)   Wt 242 lb 3.2 oz (109.9 kg)   SpO2 97%   BMI 40.30 kg/m   General Appearance: No distress  Neuro:without focal findings,  speech normal,  HEENT: PERRLA, EOM intact. Pulmonary: normal breath sounds, No wheezing.   CardiovascularNormal S1,S2.  No m/r/g.   Abdomen: Benign, Soft, non-tender. Renal:  No costovertebral tenderness  GU:  Not performed at this time. Endoc: No evident thyromegaly, no signs of acromegaly. Skin:   warm, no rash. Extremities: normal, no cyanosis, clubbing.   LABORATORY PANEL:   CBC No results for input(s): WBC, HGB, HCT, PLT in the last 168 hours. ------------------------------------------------------------------------------------------------------------------  Chemistries  No results for input(s): NA, K, CL, CO2, GLUCOSE, BUN, CREATININE, CALCIUM, MG, AST, ALT, ALKPHOS, BILITOT in the last 168 hours.  Invalid input(s):  GFRCGP ------------------------------------------------------------------------------------------------------------------  Cardiac Enzymes No results for input(s): TROPONINI in the last 168 hours. ------------------------------------------------------------  RADIOLOGY:   No results found for this or any previous visit. Results for orders placed during the hospital encounter of 10/26/15  DG Chest 2 View   Narrative CLINICAL DATA:  Cough.  EXAM: CHEST  2 VIEW  COMPARISON:  08/03/2014 .  FINDINGS: Cardiac pacer with lead tip in the right atrium and right ventricle. Cardiomegaly with pulmonary vascular prominence and mild interstitial prominence noted. Pleural effusion or pneumothorax. Degenerative changes thoracic spine with stable mild mid thoracic vertebral body compression fracture.  IMPRESSION: 1. Cardiac pacer stable position. Cardiomegaly with mild pulmonary interstitial prominence. Mild component congestive heart failure cannot be excluded. Mild pneumonitis cannot be excluded. 2. Diffuse degenerative changes and osteopenia thoracic spine. Stable mild mid thoracic vertebral body compression fracture.  Electronically Signed   By: Marcello Moores  Register   On: 10/26/2015 14:54    ------------------------------------------------------------------------------------------------------------------  Thank  you for allowing [facility] to assist in the care of your patient. Our recommendations are noted above.  Please contact us if we can be of further service.   Marda Stalker, MD.   Pulmonary and Critical Care Office Number: [phone]  Patricia Pesa, M.D.  Vilinda Boehringer, M.D.  Merton Border, M.D  09/27/2016

## 2016-09-30 ENCOUNTER — Other Ambulatory Visit: Payer: Self-pay | Admitting: Family Medicine

## 2016-10-06 ENCOUNTER — Encounter: Payer: Self-pay | Admitting: Internal Medicine

## 2016-10-06 ENCOUNTER — Ambulatory Visit (INDEPENDENT_AMBULATORY_CARE_PROVIDER_SITE_OTHER): Payer: Medicare Other | Admitting: Internal Medicine

## 2016-10-06 VITALS — BP 132/74 | HR 83 | Ht 65.0 in | Wt 242.2 lb

## 2016-10-06 DIAGNOSIS — G4733 Obstructive sleep apnea (adult) (pediatric): Secondary | ICD-10-CM | POA: Diagnosis not present

## 2016-10-06 DIAGNOSIS — G2581 Restless legs syndrome: Secondary | ICD-10-CM | POA: Diagnosis not present

## 2016-10-06 NOTE — Patient Instructions (Addendum)
--  Will change auto-set pressure to 10-20 with ramp setting.

## 2016-10-09 ENCOUNTER — Telehealth: Payer: Self-pay | Admitting: Family Medicine

## 2016-10-09 NOTE — Telephone Encounter (Signed)
errenous °

## 2016-10-12 ENCOUNTER — Ambulatory Visit: Payer: Medicare Other | Admitting: Pulmonary Disease

## 2016-10-17 ENCOUNTER — Telehealth: Payer: Self-pay | Admitting: Cardiology

## 2016-10-17 ENCOUNTER — Ambulatory Visit (INDEPENDENT_AMBULATORY_CARE_PROVIDER_SITE_OTHER): Payer: Medicare Other | Admitting: *Deleted

## 2016-10-17 DIAGNOSIS — I495 Sick sinus syndrome: Secondary | ICD-10-CM | POA: Diagnosis not present

## 2016-10-17 NOTE — Progress Notes (Signed)
Remote pacemaker transmission.   

## 2016-10-17 NOTE — Telephone Encounter (Signed)
LMOVM reminding pt to send remote transmission.   

## 2016-10-23 ENCOUNTER — Other Ambulatory Visit: Payer: Self-pay | Admitting: Family Medicine

## 2016-10-23 DIAGNOSIS — I5032 Chronic diastolic (congestive) heart failure: Secondary | ICD-10-CM

## 2016-10-23 NOTE — Telephone Encounter (Signed)
Medication has been refilled and sent to [facility] [address] 

## 2016-10-27 LAB — CUP PACEART REMOTE DEVICE CHECK
Battery Impedance: 157 Ohm
Battery Remaining Longevity: 128 mo
Battery Voltage: 2.79 V
Brady Statistic AP VP Percent: 4 %
Brady Statistic AP VS Percent: 86 %
Brady Statistic AS VP Percent: 0 %
Brady Statistic AS VS Percent: 10 %
Date Time Interrogation Session: 20171114125810
Implantable Lead Implant Date: 20080117
Implantable Lead Implant Date: 20080117
Implantable Lead Location: 753859
Implantable Lead Location: 753860
Implantable Lead Model: 5076
Implantable Lead Model: 5076
Implantable Pulse Generator Implant Date: 20141229
Lead Channel Impedance Value: 433 Ohm
Lead Channel Impedance Value: 591 Ohm
Lead Channel Pacing Threshold Amplitude: 0.5 V
Lead Channel Pacing Threshold Amplitude: 0.75 V
Lead Channel Pacing Threshold Pulse Width: 0.4 ms
Lead Channel Pacing Threshold Pulse Width: 0.4 ms
Lead Channel Sensing Intrinsic Amplitude: 11.2 mV
Lead Channel Setting Pacing Amplitude: 2 V
Lead Channel Setting Pacing Amplitude: 2.5 V
Lead Channel Setting Pacing Pulse Width: 0.4 ms
Lead Channel Setting Sensing Sensitivity: 4 mV

## 2016-11-01 ENCOUNTER — Encounter: Payer: Self-pay | Admitting: Family Medicine

## 2016-11-01 ENCOUNTER — Ambulatory Visit (INDEPENDENT_AMBULATORY_CARE_PROVIDER_SITE_OTHER): Payer: Medicare Other | Admitting: Family Medicine

## 2016-11-01 VITALS — BP 130/76 | HR 83 | Temp 98.2°F | Resp 16 | Ht 65.0 in | Wt 244.3 lb

## 2016-11-01 DIAGNOSIS — R1031 Right lower quadrant pain: Secondary | ICD-10-CM | POA: Diagnosis not present

## 2016-11-01 DIAGNOSIS — I10 Essential (primary) hypertension: Secondary | ICD-10-CM

## 2016-11-01 DIAGNOSIS — E119 Type 2 diabetes mellitus without complications: Secondary | ICD-10-CM

## 2016-11-01 DIAGNOSIS — F419 Anxiety disorder, unspecified: Secondary | ICD-10-CM | POA: Diagnosis not present

## 2016-11-01 DIAGNOSIS — E785 Hyperlipidemia, unspecified: Secondary | ICD-10-CM

## 2016-11-01 LAB — POCT GLYCOSYLATED HEMOGLOBIN (HGB A1C): Hemoglobin A1C: 6.8

## 2016-11-01 MED ORDER — ALPRAZOLAM 0.5 MG PO TABS: 0.5000 mg | ORAL_TABLET | Freq: Every day | ORAL | 0 refills | Status: DC | PRN

## 2016-11-01 MED ORDER — GLIPIZIDE 10 MG PO TABS: 10.0000 mg | ORAL_TABLET | Freq: Two times a day (BID) | ORAL | 0 refills | Status: DC

## 2016-11-01 NOTE — Progress Notes (Signed)
Name: [name] [name]   MRN: [id number]    DOB: 18-Apr-1935   Date:11/01/2016       Progress Note  Subjective  Chief Complaint  Chief Complaint  Patient presents with  . Diabetes    follow up Blood sugar 81 at home  . Hypertension  . Anxiety    medication refills    Diabetes  She presents for her follow-up diabetic visit. She has type 2 diabetes mellitus. Her disease course has been stable. Pertinent negatives for hypoglycemia include no nervousness/anxiousness or sweats. Associated symptoms include fatigue, polydipsia and polyuria (pt. also takes diuretic). Pertinent negatives for diabetes include no blurred vision. Symptoms are stable. Pertinent negatives for diabetic complications include no CVA or heart disease. Current diabetic treatment includes oral agent (triple therapy). She is following a diabetic diet. Her breakfast blood glucose range is generally 90-110 mg/dl. An ACE inhibitor/angiotensin II receptor blocker is being taken.  Hypertension  This is a chronic problem. The problem is unchanged (pt. shows me blood pressure logs with 2 systolic readings elevated into the 150s.). The problem is controlled. Associated symptoms include anxiety. Pertinent negatives include no blurred vision, palpitations (palpitations when she wakes up in the morning.), shortness of breath or sweats. Past treatments include angiotensin blockers, beta blockers and calcium channel blockers. There is no history of CVA.  Anxiety  Presents for follow-up visit. Symptoms include insomnia (wakes up early in morning, often has trouble going back to sleep). Patient reports no excessive worry, nervous/anxious behavior, palpitations (palpitations when she wakes up in the morning.) or shortness of breath. The quality of sleep is good.    Abdominal Pain  This is a recurrent problem. The onset quality is gradual. The problem occurs intermittently. The problem has been unchanged. The pain is located in the right flank. The  pain is at a severity of 6/10. The pain is moderate. The quality of the pain is aching. The abdominal pain radiates to the RLQ. Pertinent negatives include no constipation, diarrhea, fever, flatus, frequency or melena.     Past Medical History:  Diagnosis Date  . 1st degree AV block    a. PR 400 msec with Apacing  . Anemia   . Anxiety   . Arthritis    a. shoulders.  . Chest pain    a. 2004 Cath:  reportedly nl;  b. 09/2013 Neg MV.  . Chronic diastolic heart failure ([place])    a. 09/2013 Echo: EF 50-55%, mild LVH, mild to mod MR/TR.  Marland Kitchen Esophageal reflux   . H/O hiatal hernia   . History of UTI   . Hyperlipidemia   . Morbid obesity ([place])   . OSA on CPAP   . PAF (paroxysmal atrial fibrillation) ([place])   . SSS (sick sinus syndrome) ([place])    a. 2008 s/p MDT PPM;  b. 11/2013 Gen change- MDT ADDRL1 Adapta DC PPM, ser # [id number] H.  . Symptomatic PVCs   . Syncope and collapse    a. Felt to be vasovagal.  . Type II diabetes mellitus ([place])   . Unspecified essential hypertension   . Unspecified glaucoma(365.9)     Past Surgical History:  Procedure Laterality Date  . DILATION AND CURETTAGE OF UTERUS    . INCISION AND DRAINAGE OF WOUND Right 03/1998   "leg; it was like a boil" (12/01/2013)  . INSERT / REPLACE / REMOVE PACEMAKER  2008; 12/01/2013   MDT ADDRL1 pacemaker - gen change by Dr Caryl Comes 12/01/2013  . [facility]OPSY  [facility]  . PERMANENT PACEMAKER GENERATOR CHANGE N/A 12/01/2013   Procedure: PERMANENT PACEMAKER GENERATOR CHANGE;  Surgeon: Deboraha Sprang, MD;  Location: [facility] CATH LAB;  Service: Cardiovascular;  Laterality: N/A;  . VAGINAL HYSTERECTOMY  1970    Family History  Problem Relation Age of Onset  . Heart disease Father   . Rheum arthritis Father   . Allergies Father   . Liver cancer Mother   . Pancreatic cancer Mother     Social History   Social History  . Marital status: Widowed    Spouse name: N/A  . Number of children: Y  . Years of education: N/A    Occupational History  . retired    Social History Main Topics  . Smoking status: Former Smoker    Packs/day: 0.50    Years: 34.00    Types: Cigarettes    Quit date: 08/15/1989  . Smokeless tobacco: Never Used  . Alcohol use No  . Drug use: No  . Sexual activity: No   Other Topics Concern  . Not on file   Social History Narrative   Retired. Widowed. Regularly exercises.      Current Outpatient Prescriptions:  .  acetaminophen (TYLENOL) 500 MG tablet, Take 500 mg by mouth every 6 (six) hours as needed., Disp: , Rfl:  .  ALPRAZolam (XANAX) 0.5 MG tablet, Take 1 tablet (0.5 mg total) by mouth daily as needed., Disp: 90 tablet, Rfl: 0 .  amLODipine (NORVASC) 10 MG tablet, TAKE 1 TABLET (10 MG TOTAL) BY MOUTH DAILY., Disp: 90 tablet, Rfl: 0 .  atorvastatin (LIPITOR) 20 MG tablet, TAKE 1 TABLET (20 MG TOTAL) BY MOUTH DAILY., Disp: 90 tablet, Rfl: 0 .  Brinzolamide-Brimonidine (SIMBRINZA) 1-0.2 % SUSP, Place 1 drop into both eyes 2 (two) times daily., Disp: 1 Bottle, Rfl: 1 .  carvedilol (COREG) 12.5 MG tablet, Take 1 tablet (12.5 mg total) by mouth as directed. Take 1 tab in the morning with breakfast, 2 tabs in the evening before bed. (Patient taking differently: Take 12.5 mg by mouth as directed. Take 1 tab in the morning with breakfast, 2 tabs in the evening before bed.), Disp: 45 tablet, Rfl: 6 .  esomeprazole (NEXIUM) 40 MG capsule, TAKE 1 CAPSULE (40 MG TOTAL) BY MOUTH DAILY., Disp: 90 capsule, Rfl: 0 .  furosemide (LASIX) 20 MG tablet, TAKE 1 TABLET (20 MG TOTAL) BY MOUTH DAILY., Disp: 90 tablet, Rfl: 0 .  glipiZIDE (GLUCOTROL) 10 MG tablet, TAKE 1 TABLET (10 MG TOTAL) BY MOUTH 2 (TWO) TIMES DAILY BEFORE A MEAL., Disp: 135 tablet, Rfl: 0 .  metFORMIN (GLUCOPHAGE-XR) 500 MG 24 hr tablet, TAKE 1 TABLET (500 MG TOTAL) BY MOUTH 2 (TWO) TIMES DAILY. (Patient taking differently: TAKE 1 TABLET (500 MG TOTAL) BY MOUTH 2 (TWO) TIMES DAILY.), Disp: 180 tablet, Rfl: 0 .  mometasone (ELOCON)  0.1 % cream, Apply topically daily. Apply to ear as directed two times a day for 10 days as needed, Disp: 45 g, Rfl: 0 .  Multiple Vitamins-Minerals (CENTRUM SILVER PO), Take by mouth daily., Disp: , Rfl:  .  olmesartan (BENICAR) 40 MG tablet, TAKE 1 TABLET (40 MG TOTAL) BY MOUTH DAILY., Disp: 90 tablet, Rfl: 0 .  TRADJENTA 5 MG TABS tablet, TAKE 1 TABLET (5 MG TOTAL) BY MOUTH DAILY., Disp: 30 tablet, Rfl: 2 .  travoprost, benzalkonium, (TRAVATAN) 0.004 % ophthalmic solution, Place 1 drop into both eyes at bedtime. , Disp: , Rfl:  .  warfarin (COUMADIN) 3 MG  tablet, TAKE 1-1.5 TABLETS DAILY AT 6 PM. TAKE 1 TAB ON TUES, THURS, SAT, SUN. TAKE 4.5 MG ON MON, WED, FRI, Disp: 100 tablet, Rfl: 3  Allergies  Allergen Reactions  . Diltiazem Other (See Comments)    Patient stated that she had bad dreams with this medication  . Metoprolol Other (See Comments)    Bad dreams Patient stated that she had suicidal thoughts while taking this medication.  . Levofloxacin In D5w Nausea Only and Nausea And Vomiting     Review of Systems  Constitutional: Positive for fatigue. Negative for fever.  Eyes: Negative for blurred vision.  Respiratory: Negative for shortness of breath.   Cardiovascular: Negative for palpitations (palpitations when she wakes up in the morning.).  Gastrointestinal: Positive for abdominal pain. Negative for constipation, diarrhea, flatus and melena.  Genitourinary: Negative for frequency.  Endo/Heme/Allergies: Positive for polydipsia.  Psychiatric/Behavioral: The patient has insomnia (wakes up early in morning, often has trouble going back to sleep). The patient is not nervous/anxious.       Objective  Vitals:   11/01/16 1022  BP: 130/76  Pulse: 83  Resp: 16  Temp: 98.2 F (36.8 C)  TempSrc: Oral  SpO2: 94%  Weight: 244 lb 4.8 oz (110.8 kg)  Height: 5\' 5"  (1.651 m)    Physical Exam  Constitutional: She is oriented to person, place, and time and well-developed,  well-nourished, and in no distress.  Cardiovascular: Normal rate, regular rhythm and normal heart sounds.   No murmur heard. Pulmonary/Chest: Effort normal and breath sounds normal. She has no wheezes.  Abdominal: Bowel sounds are normal. There is tenderness in the right lower quadrant. There is CVA tenderness (right CVA tenderness).    Neurological: She is alert and oriented to person, place, and time.  Nursing note and vitals reviewed.      Assessment & Plan  1. Controlled type 2 diabetes mellitus without complication, without long-term current use of insulin (HCC) A1c 6.4%, well-controlled diabetes - POCT HgB A1C - glipiZIDE (GLUCOTROL) 10 MG tablet; Take 1 tablet (10 mg total) by mouth 2 (two) times daily before a meal.  Dispense: 180 tablet; Refill: 0  2. Essential hypertension Stable and controlled on present anti-hypertensive therapy  3. Anxiety  - ALPRAZolam (XANAX) 0.5 MG tablet; Take 1 tablet (0.5 mg total) by mouth daily as needed.  Dispense: 90 tablet; Refill: 0  4. Colicky RLQ abdominal pain We'll obtain CT scan of abdomen and pelvis without contrast for evaluation of intermittent but recurrent right flank pain radiating into right lower quadrant and right groin - CT Abdomen Pelvis Wo Contrast; Future  5. Hyperlipidemia, unspecified hyperlipidemia type  - Lipid Profile - COMPLETE METABOLIC PANEL WITH GFR  Zianne Schubring Asad A[facility] 11/01/2016 10:33 AM

## 2016-11-02 ENCOUNTER — Telehealth: Payer: Self-pay | Admitting: Internal Medicine

## 2016-11-02 LAB — COMPLETE METABOLIC PANEL WITH GFR
ALT: 14 U/L (ref 6–29)
AST: 17 U/L (ref 10–35)
Albumin: 3.9 g/dL (ref 3.6–5.1)
Alkaline Phosphatase: 74 U/L (ref 33–130)
BUN: 14 mg/dL (ref 7–25)
CO2: 25 mmol/L (ref 20–31)
Calcium: 9.5 mg/dL (ref 8.6–10.4)
Chloride: 107 mmol/L (ref 98–110)
Creat: 1.34 mg/dL — ABNORMAL HIGH (ref 0.60–0.93)
GFR, Est African American: 44 mL/min — ABNORMAL LOW (ref >=60)
GFR, Est Non African American: 38 mL/min — ABNORMAL LOW (ref >=60)
Glucose, Bld: 99 mg/dL (ref 65–99)
Potassium: 3.8 mmol/L (ref 3.5–5.3)
Sodium: 142 mmol/L (ref 135–146)
Total Bilirubin: 0.7 mg/dL (ref 0.2–1.2)
Total Protein: 6.9 g/dL (ref 6.1–8.1)

## 2016-11-02 LAB — LIPID PANEL
Cholesterol: 156 mg/dL (ref <200)
HDL: 58 mg/dL (ref >50)
LDL Cholesterol: 79 mg/dL (ref <100)
Total CHOL/HDL Ratio: 2.7 Ratio (ref <5.0)
Triglycerides: 93 mg/dL (ref <150)
VLDL: 19 mg/dL (ref <30)

## 2016-11-02 NOTE — Telephone Encounter (Signed)
Pt states she sleeps well but wakes up around to 2am and she is having rapid heart beats at this time. States her BPs are elevated ever since we increased her CPAP settings. Please advise.

## 2016-11-02 NOTE — Telephone Encounter (Signed)
Pt states he pressure has been raised up to a 10, and states she is having BP problems at night. States her BP this morning at 5:30 is 155/65, 6 am 150/66, just now 152/65. Sunday at 6 am 153/71. Please call.

## 2016-11-03 NOTE — Telephone Encounter (Signed)
CPAP would not cause her BP to go up. Please provide a download from her device for my review. If the OSA is inadequately treated may need to send her for an In-lab titration study.

## 2016-11-03 NOTE — Telephone Encounter (Signed)
Report printed and placed in your folder.

## 2016-11-06 NOTE — Telephone Encounter (Signed)
I reviewed her download, her OSA is adequately treated on the current settings, and leaks are minimal. She needs to inform her PCP about the rapid heart beat and elevated BP because this may need tobe evaluated.

## 2016-11-07 ENCOUNTER — Ambulatory Visit
Admission: RE | Admit: 2016-11-07 | Discharge: 2016-11-07 | Disposition: A | Discharge status: Home / Self Care | Payer: Medicare Other | Source: Ambulatory Visit | Attending: Family Medicine | Admitting: Family Medicine

## 2016-11-07 DIAGNOSIS — R1031 Right lower quadrant pain: Secondary | ICD-10-CM

## 2016-11-07 DIAGNOSIS — K5730 Diverticulosis of large intestine without perforation or abscess without bleeding: Secondary | ICD-10-CM | POA: Diagnosis not present

## 2016-11-07 NOTE — Telephone Encounter (Signed)
Pt informed that she needs to f/u with her PCP for these symptoms and that it is not coming from her CPAP. Pt verbalized understanding. Nothing further needed.

## 2016-11-09 ENCOUNTER — Encounter: Payer: Self-pay | Admitting: Emergency Medicine

## 2016-11-09 ENCOUNTER — Emergency Department
Admission: EM | Admit: 2016-11-09 | Discharge: 2016-11-10 | Disposition: A | Discharge status: Home / Self Care | Payer: Medicare Other | Attending: Emergency Medicine | Admitting: Emergency Medicine

## 2016-11-09 DIAGNOSIS — Z7984 Long term (current) use of oral hypoglycemic drugs: Secondary | ICD-10-CM | POA: Insufficient documentation

## 2016-11-09 DIAGNOSIS — I5032 Chronic diastolic (congestive) heart failure: Secondary | ICD-10-CM | POA: Diagnosis not present

## 2016-11-09 DIAGNOSIS — Z7901 Long term (current) use of anticoagulants: Secondary | ICD-10-CM | POA: Diagnosis not present

## 2016-11-09 DIAGNOSIS — I110 Hypertensive heart disease with heart failure: Secondary | ICD-10-CM | POA: Diagnosis not present

## 2016-11-09 DIAGNOSIS — Z950 Presence of cardiac pacemaker: Secondary | ICD-10-CM | POA: Diagnosis not present

## 2016-11-09 DIAGNOSIS — I10 Essential (primary) hypertension: Secondary | ICD-10-CM | POA: Diagnosis not present

## 2016-11-09 DIAGNOSIS — R079 Chest pain, unspecified: Secondary | ICD-10-CM | POA: Diagnosis not present

## 2016-11-09 DIAGNOSIS — F419 Anxiety disorder, unspecified: Secondary | ICD-10-CM | POA: Diagnosis not present

## 2016-11-09 DIAGNOSIS — R0602 Shortness of breath: Secondary | ICD-10-CM | POA: Insufficient documentation

## 2016-11-09 DIAGNOSIS — E119 Type 2 diabetes mellitus without complications: Secondary | ICD-10-CM | POA: Diagnosis not present

## 2016-11-09 DIAGNOSIS — Z87891 Personal history of nicotine dependence: Secondary | ICD-10-CM | POA: Diagnosis not present

## 2016-11-09 DIAGNOSIS — R0600 Dyspnea, unspecified: Secondary | ICD-10-CM | POA: Diagnosis not present

## 2016-11-09 DIAGNOSIS — I5022 Chronic systolic (congestive) heart failure: Secondary | ICD-10-CM | POA: Diagnosis not present

## 2016-11-09 LAB — BASIC METABOLIC PANEL
Anion gap: 8 (ref 5–15)
BUN: 18 mg/dL (ref 6–20)
CO2: 25 mmol/L (ref 22–32)
Calcium: 9.4 mg/dL (ref 8.9–10.3)
Chloride: 109 mmol/L (ref 101–111)
Creatinine, Ser: 1.05 mg/dL — ABNORMAL HIGH (ref 0.44–1.00)
GFR calc Af Amer: 57 mL/min — ABNORMAL LOW (ref >60)
GFR calc non Af Amer: 50 mL/min — ABNORMAL LOW (ref >60)
Glucose, Bld: 114 mg/dL — ABNORMAL HIGH (ref 65–99)
Potassium: 3.4 mmol/L — ABNORMAL LOW (ref 3.5–5.1)
Sodium: 142 mmol/L (ref 135–145)

## 2016-11-09 LAB — TROPONIN I: Troponin I: <0.03 ng/mL (ref <0.03)

## 2016-11-09 LAB — CBC
HCT: 29.7 % — ABNORMAL LOW (ref 35.0–47.0)
Hemoglobin: 9.8 g/dL — ABNORMAL LOW (ref 12.0–16.0)
MCH: 22.3 pg — ABNORMAL LOW (ref 26.0–34.0)
MCHC: 33 g/dL (ref 32.0–36.0)
MCV: 67.5 fL — ABNORMAL LOW (ref 80.0–100.0)
Platelets: 208 10*3/uL (ref 150–440)
RBC: 4.4 MIL/uL (ref 3.80–5.20)
RDW: 17.5 % — ABNORMAL HIGH (ref 11.5–14.5)
WBC: 7.6 10*3/uL (ref 3.6–11.0)

## 2016-11-09 NOTE — ED Triage Notes (Addendum)
Pt arrived via ems from home with complaints of "chest difficulty." Pt states she "has a problem" that causes her to wake up in her sleep with a "very fast heart beat." Pt unsure of rate. Upon assessment HR 70, ems reports a rate of 60. Pt has pace maker that was placed in 2008

## 2016-11-10 ENCOUNTER — Emergency Department: Payer: Medicare Other

## 2016-11-10 ENCOUNTER — Telehealth: Payer: Self-pay | Admitting: Cardiovascular Disease

## 2016-11-10 ENCOUNTER — Ambulatory Visit (INDEPENDENT_AMBULATORY_CARE_PROVIDER_SITE_OTHER): Payer: Medicare Other | Admitting: Podiatry

## 2016-11-10 DIAGNOSIS — L603 Nail dystrophy: Secondary | ICD-10-CM

## 2016-11-10 DIAGNOSIS — M79609 Pain in unspecified limb: Secondary | ICD-10-CM

## 2016-11-10 DIAGNOSIS — R0602 Shortness of breath: Secondary | ICD-10-CM | POA: Diagnosis not present

## 2016-11-10 DIAGNOSIS — L84 Corns and callosities: Secondary | ICD-10-CM

## 2016-11-10 DIAGNOSIS — Q828 Other specified congenital malformations of skin: Secondary | ICD-10-CM

## 2016-11-10 DIAGNOSIS — M79671 Pain in right foot: Secondary | ICD-10-CM

## 2016-11-10 DIAGNOSIS — E0843 Diabetes mellitus due to underlying condition with diabetic autonomic (poly)neuropathy: Secondary | ICD-10-CM | POA: Diagnosis not present

## 2016-11-10 DIAGNOSIS — B351 Tinea unguium: Secondary | ICD-10-CM | POA: Diagnosis not present

## 2016-11-10 DIAGNOSIS — R079 Chest pain, unspecified: Secondary | ICD-10-CM | POA: Diagnosis not present

## 2016-11-10 DIAGNOSIS — L851 Acquired keratosis [keratoderma] palmaris et plantaris: Secondary | ICD-10-CM | POA: Diagnosis not present

## 2016-11-10 DIAGNOSIS — L608 Other nail disorders: Secondary | ICD-10-CM | POA: Diagnosis not present

## 2016-11-10 DIAGNOSIS — M79672 Pain in left foot: Secondary | ICD-10-CM | POA: Diagnosis not present

## 2016-11-10 NOTE — ED Notes (Signed)
Pt assisted to bathroom in room. Pt became very short of breath and had to stop to catch her breath. Safely returned to bed. Respiratory rate 36 with 100% oxygenation. Pt shortness of breath relieved before leaving room.

## 2016-11-10 NOTE — Telephone Encounter (Signed)
ED notes indicate that she had an anxiety attack.  It also mentions that pt needs cardiology appt to adjust her BP meds. See if she is willing to come in to discuss this.  Thank you!

## 2016-11-10 NOTE — Telephone Encounter (Signed)
Pt was seen in ED last night for SOB  Was recently in office  Please advise if patient needs to be seen in office again.  She already has a 1 month follow up appointment

## 2016-11-10 NOTE — ED Notes (Signed)
Pt waiting for doctor to speak with them.

## 2016-11-10 NOTE — ED Provider Notes (Signed)
[facility] Provider Note  ____________________________________________  Time seen: Approximately 1:53 AM  I have reviewed the triage vital signs and the nursing notes.   HISTORY  Chief Complaint Shortness of Breath    HPI [name] [name] is a 81 y.o. female who reports waking up today feeling like her heart was beating very fast. She then started breathing quickly and then developed numbness in the face and bilateral hands. She has a history of anxiety and takes Xanax every night before going to bed to help her sleep, but then frequently wakes up around 5 AM. She finds it difficult to go back to sleep at that time.  No exertional symptoms, no chest pain. Sees Dr. Rockey Situ from cardiology. Compliant with medications. No recent cough, congestion and runny nose or sore throat.     Past Medical History:  Diagnosis Date  . 1st degree AV block    a. PR 400 msec with Apacing  . Anemia   . Anxiety   . Arthritis    a. shoulders.  . Chest pain    a. 2004 Cath:  reportedly nl;  b. 09/2013 Neg MV.  . Chronic diastolic heart failure ([place])    a. 09/2013 Echo: EF 50-55%, mild LVH, mild to mod MR/TR.  Marland Kitchen Esophageal reflux   . H/O hiatal hernia   . History of UTI   . Hyperlipidemia   . Morbid obesity ([place])   . OSA on CPAP   . PAF (paroxysmal atrial fibrillation) ([place])   . SSS (sick sinus syndrome) ([place])    a. 2008 s/p MDT PPM;  b. 11/2013 Gen change- MDT ADDRL1 Adapta DC PPM, ser # [id number] H.  . Symptomatic PVCs   . Syncope and collapse    a. Felt to be vasovagal.  . Type II diabetes mellitus ([place])   . Unspecified essential hypertension   . Unspecified glaucoma(365.9)      Patient Active Problem List   Diagnosis Date Noted  . Colicky RLQ abdominal pain 11/01/2016  . 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 Anxiety 07/28/2016  . Acute maxillary sinusitis 03/23/2016  . Pain of right hip joint 03/23/2016  . Chronic gout of right foot  12/16/2015  . Productive cough 10/26/2015  . Infection of skin of left ear lobe 10/11/2015  . Acute non-recurrent maxillary sinusitis 09/17/2015  . Allergic rhinitis 09/14/2015  . Hyperlipidemia 09/14/2015  . Gout attack 07/21/2015  . Anticoagulation monitoring, INR range 2-3 05/18/2015  . RLS (restless legs syndrome) 04/14/2015  . 1st degree AV block   . Chronic diastolic heart failure ([place])   . Cardiac arrhythmia 12/01/2013  . Hemoptysis 11/24/2013  . Chronic systolic heart failure ([place]) 10/24/2013  . Acute on chronic diastolic CHF (congestive heart failure) ([place]) 10/17/2013  . Episodic lightheadedness 07/16/2013  . Palpitations 07/16/2013  . Syncope 08/15/2012  . Back pain 05/30/2012  . OSA (obstructive sleep apnea) 01/08/2012  . DYSPNEA 01/28/2010  . PVC (premature ventricular contraction) 09/13/2009  . EDEMA 08/23/2009  . Diabetes mellitus type 2, controlled, without complications ([place]) 05/69/7948  . GLAUCOMA 04/13/2009  . Essential hypertension 04/13/2009  . PAF (paroxysmal atrial fibrillation) ([place]) 04/13/2009  . SICK SINUS/ TACHY-BRADY SYNDROME 04/13/2009  . GERD 04/13/2009  . Cardiac pacemaker - Medtronic 04/13/2009     Past Surgical History:  Procedure Laterality Date  . DILATION AND CURETTAGE OF UTERUS    . INCISION AND DRAINAGE OF WOUND Right 03/1998   "leg; it was like a boil" (12/01/2013)  .  INSERT / REPLACE / REMOVE PACEMAKER  2008; 12/01/2013   MDT ADDRL1 pacemaker - gen change by Dr Caryl Comes 12/01/2013  . [facility]OP[facility]  [facility]  . PERMANENT PACEMAKER GENERATOR CHANGE N/A 12/01/2013   Procedure: PERMANENT PACEMAKER GENERATOR CHANGE;  Surgeon: Deboraha Sprang, MD;  Location: [facility] CATH LAB;  Service: Cardiovascular;  Laterality: N/A;  . [place]     Prior to Admission medications   Medication Sig Start Date End Date Taking? Authorizing Provider  acetaminophen (TYLENOL) 500 MG tablet Take 500 mg by mouth every 6 (six) hours as needed.     Historical Provider, MD  ALPRAZolam Duanne Moron) 0.5 MG tablet Take 1 tablet (0.5 mg total) by mouth daily as needed. 11/01/16   Roselee Nova, MD  amLODipine (NORVASC) 10 MG tablet TAKE 1 TABLET (10 MG TOTAL) BY MOUTH DAILY. 09/15/16   Roselee Nova, MD  atorvastatin (LIPITOR) 20 MG tablet TAKE 1 TABLET (20 MG TOTAL) BY MOUTH DAILY. 10/02/16   Roselee Nova, MD  Brinzolamide-Brimonidine [facility]) 1-0.2 % SUSP Place 1 drop into both eyes 2 (two) times daily. 07/05/15   Roselee Nova, MD  carvedilol (COREG) 12.5 MG tablet Take 1 tablet (12.5 mg total) by mouth as directed. Take 1 tab in the morning with breakfast, 2 tabs in the evening before bed. Patient taking differently: Take 12.5 mg by mouth as directed. Take 1 tab in the morning with breakfast, 2 tabs in the evening before bed. 09/12/16   Minna Merritts, MD  esomeprazole (NEXIUM) 40 MG capsule TAKE 1 CAPSULE (40 MG TOTAL) BY MOUTH DAILY. 06/02/16   Roselee Nova, MD  furosemide (LASIX) 20 MG tablet TAKE 1 TABLET (20 MG TOTAL) BY MOUTH DAILY. 10/23/16   Roselee Nova, MD  glipiZIDE (GLUCOTROL) 10 MG tablet Take 1 tablet (10 mg total) by mouth 2 (two) times daily before a meal. 11/01/16   Roselee Nova, MD  metFORMIN (GLUCOPHAGE-XR) 500 MG 24 hr tablet TAKE 1 TABLET (500 MG TOTAL) BY MOUTH 2 (TWO) TIMES DAILY. Patient taking differently: TAKE 1 TABLET (500 MG TOTAL) BY MOUTH 2 (TWO) TIMES DAILY. 06/02/16   Roselee Nova, MD  mometasone (ELOCON) 0.1 % cream Apply topically daily. Apply to ear as directed two times a day for 10 days as needed 08/22/16   Roselee Nova, MD  Multiple Vitamins-Minerals (CENTRUM SILVER PO) Take by mouth daily.    Historical Provider, MD  olmesartan (BENICAR) 40 MG tablet TAKE 1 TABLET (40 MG TOTAL) BY MOUTH DAILY. 09/20/16   Roselee Nova, MD  TRADJENTA 5 MG TABS tablet TAKE 1 TABLET (5 MG TOTAL) BY MOUTH DAILY. 09/06/16   Roselee Nova, MD  travoprost, benzalkonium, (TRAVATAN) 0.004 %  ophthalmic solution Place 1 drop into both eyes at bedtime.     Historical Provider, MD  warfarin (COUMADIN) 3 MG tablet TAKE 1-1.5 TABLETS DAILY AT 6 PM. TAKE 1 TAB ON TUES, THURS, SAT, SUN. TAKE 4.5 MG ON MON, WED, FRI 10/25/15   Roselee Nova, MD     Allergies Diltiazem; Metoprolol; and Levofloxacin in d5w   Family History  Problem Relation Age of Onset  . Heart disease Father   . Rheum arthritis Father   . Allergies Father   . Liver cancer Mother   . Pancreatic cancer Mother     Social History Social History  Substance Use  Topics  . Smoking status: Former Smoker    Packs/day: 0.50    Years: 34.00    Types: Cigarettes    Quit date: 08/15/1989  . Smokeless tobacco: Never Used  . Alcohol use No    Review of Systems  Constitutional:   No fever or chills.  ENT:   No sore throat. No rhinorrhea. Cardiovascular:   No chest pain. Respiratory:   Occasional shortness of breath. Gastrointestinal:   Negative for abdominal pain, vomiting and diarrhea.  Genitourinary:   Negative for dysuria or difficulty urinating. Musculoskeletal:   Negative for focal pain or swelling Neurological:   Negative for headaches 10-point ROS otherwise negative.  ____________________________________________   PHYSICAL EXAM:  VITAL SIGNS: ED Triage Vitals  Enc Vitals Group     BP 11/09/16 2257 (!) 146/65     Pulse Rate 11/09/16 2254 68     Resp 11/09/16 2254 13     Temp 11/09/16 2254 98.9 F (37.2 C)     Temp Source 11/09/16 2254 Oral     SpO2 11/09/16 2254 100 %     Weight 11/09/16 2254 242 lb (109.8 kg)     Height 11/09/16 2254 5\' 5"  (1.651 m)     Head Circumference --      Peak Flow --      Pain Score --      Pain Loc --      Pain Edu? --      Excl. in [place]? --     Vital signs reviewed, nursing assessments reviewed.   Constitutional:   Alert and oriented. Well appearing and in no distress. Eyes:   No scleral icterus. No conjunctival pallor. PERRL. EOMI.  No nystagmus. ENT    Head:   Normocephalic and atraumatic.   Nose:   No congestion/rhinnorhea. No septal hematoma   Mouth/Throat:   MMM, no pharyngeal erythema. No peritonsillar mass.    Neck:   No stridor. No SubQ emphysema. No meningismus. Hematological/Lymphatic/Immunilogical:   No cervical lymphadenopathy. Cardiovascular:   RRR. Symmetric bilateral radial and DP pulses.  No murmurs.  Respiratory:   Normal respiratory effort without tachypnea nor retractions. Breath sounds are clear and equal bilaterally. No wheezes/rales/rhonchi. Gastrointestinal:   Soft and nontender. Non distended. There is no CVA tenderness.  No rebound, rigidity, or guarding. Genitourinary:   deferred Musculoskeletal:   Nontender with normal range of motion in all extremities. No joint effusions.  No lower extremity tenderness.  No edema. Neurologic:   Normal speech and language.  CN 2-10 normal. Motor grossly intact. No gross focal neurologic deficits are appreciated.  Skin:    Skin is warm, dry and intact. No rash noted.  No petechiae, purpura, or bullae.  ____________________________________________    LABS (pertinent positives/negatives) (all labs ordered are listed, but only abnormal results are displayed) Labs Reviewed  BASIC METABOLIC PANEL - Abnormal; Notable for the following:       Result Value   Potassium 3.4 (*)    Glucose, Bld 114 (*)    Creatinine, Ser 1.05 (*)    GFR calc non Af Amer 50 (*)    GFR calc Af Amer 57 (*)    All other components within normal limits  CBC - Abnormal; Notable for the following:    Hemoglobin 9.8 (*)    HCT 29.7 (*)    MCV 67.5 (*)    MCH 22.3 (*)    RDW 17.5 (*)    All other components within normal limits  TROPONIN I  ____________________________________________   EKG  Interpreted by me Atrial pacing, rate of 72, normal axis intervals QRS ST segments and T waves.  ____________________________________________    RADIOLOGY  Chest x-ray  unremarkable  ____________________________________________   PROCEDURES Procedures  ____________________________________________   INITIAL IMPRESSION / ASSESSMENT AND PLAN / ED COURSE  Pertinent labs & imaging results that were available during my care of the patient were reviewed by me and considered in my medical decision making (see chart for details).  Patient well appearing no acute distress. By history is highly consistent with anxiety attacks. Vital signs unremarkable, all chronic in nature. Reviewed her CT result with her from 3 days ago told her to follow up with primary care. It seems that they may refer her to GI. Follow-up with cardiology today for reassessment, possible medication titration. Her blood pressures have been frequently running 482-500 systolic at home.     Clinical Course    ____________________________________________   FINAL CLINICAL IMPRESSION(S) / ED DIAGNOSES  Final diagnoses:  Shortness of breath  Anxiety       Portions of this note were generated with dragon dictation software. Dictation errors may occur despite best attempts at proofreading.    Carrie Mew, MD 11/10/16 [phone]

## 2016-11-11 NOTE — Progress Notes (Signed)
SUBJECTIVE Patient with a history of diabetes mellitus presents to office today complaining of elongated, thickened nails. Pain while ambulating in shoes. Patient is unable to trim their own nails.  Patient also complains of painful callus lesions to the bilateral weightbearing surfaces of the forefoot. Patient states that they hurt when she is ambulating. Patient presents today for further treatment and evaluation  Allergies  Allergen Reactions  . Diltiazem Other (See Comments)    Patient stated that she had bad dreams with this medication  . Metoprolol Other (See Comments)    Bad dreams Patient stated that she had suicidal thoughts while taking this medication.  . Levofloxacin In D5w Nausea Only and Nausea And Vomiting    OBJECTIVE General Patient is awake, alert, and oriented x 3 and in no acute distress. Derm Hyperkeratotic skin lesions noted to the weightbearing surfaces of the bilateral forefoot 5. Skin is dry and supple bilateral. Negative open lesions or macerations. Remaining integument unremarkable. Nails are tender, long, thickened and dystrophic with subungual debris, consistent with onychomycosis, 1-5 bilateral. No signs of infection noted. Vasc  DP and PT pedal pulses palpable bilaterally. Temperature gradient within normal limits.  Neuro Epicritic and protective threshold sensation diminished bilaterally.  Musculoskeletal Exam No symptomatic pedal deformities noted bilateral. Muscular strength within normal limits.  ASSESSMENT 1. Diabetes Mellitus w/ peripheral neuropathy 2. Onychomycosis of nail due to dermatophyte bilateral 3. Pain in foot bilateral 4. Painful callus lesions bilateral forefoot 5.  PLAN OF CARE 1. Patient evaluated today. 2. Instructed to maintain good pedal hygiene and foot care. Stressed importance of controlling blood sugar.  3. Mechanical debridement of nails 1-5 bilaterally performed using a nail nipper. Filed with dremel without incident.  4.  Excisional debridement of the hyperkeratotic callus lesions was performed using a chisel blade without incident or bleeding. Salicylic acid cream applied with light dressing. 5. Return to clinic in 3 mos.     Edrick Kins, DPM [facility]  Dr. Edrick Kins, [address]                                        [place], [place] [address]                Office [phone]  Fax [phone]

## 2016-11-13 ENCOUNTER — Telehealth: Payer: Self-pay | Admitting: Cardiovascular Disease

## 2016-11-13 NOTE — Telephone Encounter (Signed)
Please see her previous phone note.  She was supposed to have been set up for an ED f/u. Can you make that happen?

## 2016-11-13 NOTE — Telephone Encounter (Signed)
Pt states she was in the ED for rapid HR. Please call.

## 2016-11-16 ENCOUNTER — Encounter: Payer: Self-pay | Admitting: Family Medicine

## 2016-11-16 ENCOUNTER — Ambulatory Visit (INDEPENDENT_AMBULATORY_CARE_PROVIDER_SITE_OTHER): Payer: Medicare Other | Admitting: Family Medicine

## 2016-11-16 VITALS — BP 132/76 | HR 84 | Temp 97.9°F | Resp 16 | Ht 65.0 in | Wt 242.7 lb

## 2016-11-16 DIAGNOSIS — R1031 Right lower quadrant pain: Secondary | ICD-10-CM | POA: Diagnosis not present

## 2016-11-16 DIAGNOSIS — K219 Gastro-esophageal reflux disease without esophagitis: Secondary | ICD-10-CM

## 2016-11-16 DIAGNOSIS — F419 Anxiety disorder, unspecified: Secondary | ICD-10-CM

## 2016-11-16 MED ORDER — ESOMEPRAZOLE MAGNESIUM 40 MG PO CPDR: DELAYED_RELEASE_CAPSULE | ORAL | 1 refills | Status: DC

## 2016-11-16 NOTE — Progress Notes (Signed)
Name: [name] [name]   MRN: [id number]    DOB: [birth year]   Date:11/16/2016       Progress Note  Subjective  Chief Complaint  Chief Complaint  Patient presents with  . Follow-up    test results    Gastroesophageal Reflux  She reports no abdominal pain, no chest pain, no heartburn, no nausea or no stridor. This is a chronic problem. The problem has been unchanged. She has tried a PPI for the symptoms.   Patient is also here to review results of CT scan abdomen and pelvis ordered at last office visit along with lab work. She has mild intermittent right lower quadrant pain, CT scan showed no acute abnormality.  Past Medical History:  Diagnosis Date  . 1st degree AV block    a. PR 400 msec with Apacing  . Anemia   . Anxiety   . Arthritis    a. shoulders.  . Chest pain    a. 2004 Cath:  reportedly nl;  b. 09/2013 Neg MV.  . Chronic diastolic heart failure ([place])    a. 09/2013 Echo: EF 50-55%, mild LVH, mild to mod MR/TR.  Marland Kitchen Esophageal reflux   . H/O hiatal hernia   . History of UTI   . Hyperlipidemia   . Morbid obesity ([place])   . OSA on CPAP   . PAF (paroxysmal atrial fibrillation) ([place])   . SSS (sick sinus syndrome) ([place])    a. 2008 s/p MDT PPM;  b. 11/2013 Gen change- MDT ADDRL1 Adapta DC PPM, ser # [id number] H.  . Symptomatic PVCs   . Syncope and collapse    a. Felt to be vasovagal.  . Type II diabetes mellitus ([place])   . Unspecified essential hypertension   . Unspecified glaucoma(365.9)     Past Surgical History:  Procedure Laterality Date  . DILATION AND CURETTAGE OF UTERUS    . INCISION AND DRAINAGE OF WOUND Right 03/1998   "leg; it was like a boil" (12/01/2013)  . INSERT / REPLACE / REMOVE PACEMAKER  2008; 12/01/2013   MDT ADDRL1 pacemaker - gen change by Dr Caryl Comes 12/01/2013  . [facility]  [facility]   [facility]  . PERMANENT PACEMAKER GENERATOR CHANGE N/A 12/01/2013   Procedure: PERMANENT PACEMAKER GENERATOR CHANGE;  Surgeon: Deboraha Sprang, MD;  Location: [facility] CATH LAB;   Service: Cardiovascular;  Laterality: N/A;  . VAGINAL HYSTERECTOMY  1970    Family History  Problem Relation Age of Onset  . Heart disease Father   . Rheum arthritis Father   . Allergies Father   . Liver cancer Mother   . Pancreatic cancer Mother     Social History   Social History  . Marital status: Widowed    Spouse name: N/A  . Number of children: Y  . Years of education: N/A   Occupational History  . retired    Social History Main Topics  . Smoking status: Former Smoker    Packs/day: 0.50    Years: 34.00    Types: Cigarettes    Quit date: 08/15/1989  . Smokeless tobacco: Never Used  . Alcohol use No  . Drug use: No  . Sexual activity: No   Other Topics Concern  . Not on file   Social History Narrative   Retired. Widowed. Regularly exercises.      Current Outpatient Prescriptions:  .  acetaminophen (TYLENOL) 500 MG tablet, Take 500 mg by mouth every 6 (six) hours as needed., Disp: , Rfl:  .  ALPRAZolam (XANAX) 0.5 MG tablet, Take 1 tablet (0.5 mg total) by mouth daily as needed., Disp: 90 tablet, Rfl: 0 .  amLODipine (NORVASC) 10 MG tablet, TAKE 1 TABLET (10 MG TOTAL) BY MOUTH DAILY., Disp: 90 tablet, Rfl: 0 .  atorvastatin (LIPITOR) 20 MG tablet, TAKE 1 TABLET (20 MG TOTAL) BY MOUTH DAILY., Disp: 90 tablet, Rfl: 0 .  Brinzolamide-Brimonidine (SIMBRINZA) 1-0.2 % SUSP, Place 1 drop into both eyes 2 (two) times daily., Disp: 1 Bottle, Rfl: 1 .  carvedilol (COREG) 12.5 MG tablet, Take 1 tablet (12.5 mg total) by mouth as directed. Take 1 tab in the morning with breakfast, 2 tabs in the evening before bed. (Patient taking differently: Take 12.5 mg by mouth as directed. Take 1 tab in the morning with breakfast, 2 tabs in the evening before bed.), Disp: 45 tablet, Rfl: 6 .  esomeprazole (NEXIUM) 40 MG capsule, TAKE 1 CAPSULE (40 MG TOTAL) BY MOUTH DAILY., Disp: 90 capsule, Rfl: 0 .  furosemide (LASIX) 20 MG tablet, TAKE 1 TABLET (20 MG TOTAL) BY MOUTH DAILY., Disp: 90  tablet, Rfl: 0 .  glipiZIDE (GLUCOTROL) 10 MG tablet, Take 1 tablet (10 mg total) by mouth 2 (two) times daily before a meal., Disp: 180 tablet, Rfl: 0 .  metFORMIN (GLUCOPHAGE-XR) 500 MG 24 hr tablet, TAKE 1 TABLET (500 MG TOTAL) BY MOUTH 2 (TWO) TIMES DAILY. (Patient taking differently: TAKE 1 TABLET (500 MG TOTAL) BY MOUTH 2 (TWO) TIMES DAILY.), Disp: 180 tablet, Rfl: 0 .  mometasone (ELOCON) 0.1 % cream, Apply topically daily. Apply to ear as directed two times a day for 10 days as needed, Disp: 45 g, Rfl: 0 .  Multiple Vitamins-Minerals (CENTRUM SILVER PO), Take by mouth daily., Disp: , Rfl:  .  olmesartan (BENICAR) 40 MG tablet, TAKE 1 TABLET (40 MG TOTAL) BY MOUTH DAILY., Disp: 90 tablet, Rfl: 0 .  TRADJENTA 5 MG TABS tablet, TAKE 1 TABLET (5 MG TOTAL) BY MOUTH DAILY., Disp: 30 tablet, Rfl: 2 .  travoprost, benzalkonium, (TRAVATAN) 0.004 % ophthalmic solution, Place 1 drop into both eyes at bedtime. , Disp: , Rfl:  .  warfarin (COUMADIN) 3 MG tablet, TAKE 1-1.5 TABLETS DAILY AT 6 PM. TAKE 1 TAB ON TUES, THURS, SAT, SUN. TAKE 4.5 MG ON MON, WED, FRI, Disp: 100 tablet, Rfl: 3  Allergies  Allergen Reactions  . Diltiazem Other (See Comments)    Patient stated that she had bad dreams with this medication  . Metoprolol Other (See Comments)    Bad dreams Patient stated that she had suicidal thoughts while taking this medication.  . Levofloxacin In D5w Nausea Only and Nausea And Vomiting     Review of Systems  Constitutional: Positive for malaise/fatigue. Negative for chills and fever.  Cardiovascular: Positive for palpitations. Negative for chest pain.  Gastrointestinal: Negative for abdominal pain, blood in stool, constipation, diarrhea, heartburn and nausea.  Psychiatric/Behavioral: Negative for depression. The patient is nervous/anxious and has insomnia.       Objective  Vitals:   11/16/16 1136  BP: 132/76  Pulse: 84  Resp: 16  Temp: 97.9 F (36.6 C)  TempSrc: Oral  SpO2:  95%  Weight: 242 lb 11.2 oz (110.1 kg)  Height: 5\' 5"  (1.651 m)    Physical Exam  Constitutional: She is oriented to person, place, and time and well-developed, well-nourished, and in no distress.  HENT:  Head: Normocephalic and atraumatic.  Cardiovascular: Normal rate, regular rhythm and normal heart sounds.   No  murmur heard. Pulmonary/Chest: Effort normal and breath sounds normal. She has no wheezes.  Abdominal: Soft. Bowel sounds are normal. There is no tenderness.  Neurological: She is alert and oriented to person, place, and time.  Psychiatric: Mood, memory, affect and judgment normal.  Nursing note and vitals reviewed.      Assessment & Plan  1. Gastroesophageal reflux disease, esophagitis presence not specified Symptoms stable on PPI therapy. - esomeprazole (NEXIUM) 40 MG capsule; TAKE 1 CAPSULE (40 MG TOTAL) BY MOUTH DAILY.  Dispense: 90 capsule; Refill: 1  2. Colicky RLQ abdominal pain Discussed the CT scan abdomen and pelvis findings, reassured. I suspect her abdominal pain is due to abdominal wall muscle spasm.  3. Anxiety Reviewed from recent ED visit, likely had a panic attack, advised to continue and take alprazolam once daily as needed, reassess in one month.   Jamire Shabazz Asad A. [facility] 11/16/2016 11:44 AM

## 2016-11-17 ENCOUNTER — Encounter: Payer: Self-pay | Admitting: Cardiovascular Disease

## 2016-11-17 ENCOUNTER — Ambulatory Visit (INDEPENDENT_AMBULATORY_CARE_PROVIDER_SITE_OTHER): Payer: Medicare Other | Admitting: Cardiovascular Disease

## 2016-11-17 VITALS — BP 120/62 | HR 63 | Ht 65.0 in | Wt 241.5 lb

## 2016-11-17 DIAGNOSIS — I480 Paroxysmal atrial fibrillation: Secondary | ICD-10-CM

## 2016-11-17 DIAGNOSIS — E119 Type 2 diabetes mellitus without complications: Secondary | ICD-10-CM

## 2016-11-17 DIAGNOSIS — I5033 Acute on chronic diastolic (congestive) heart failure: Secondary | ICD-10-CM

## 2016-11-17 DIAGNOSIS — R002 Palpitations: Secondary | ICD-10-CM | POA: Diagnosis not present

## 2016-11-17 DIAGNOSIS — I10 Essential (primary) hypertension: Secondary | ICD-10-CM | POA: Diagnosis not present

## 2016-11-17 DIAGNOSIS — R0602 Shortness of breath: Secondary | ICD-10-CM

## 2016-11-17 MED ORDER — FLECAINIDE ACETATE 50 MG PO TABS: 50.0000 mg | ORAL_TABLET | Freq: Two times a day (BID) | ORAL | 6 refills | Status: DC

## 2016-11-17 NOTE — Progress Notes (Signed)
Cardiology Office Note  Date:  11/17/2016   ID:  [name] [name], DOB [birth year], MRN [id number]  PCP:  Keith Rake, MD   Chief Complaint  Patient presents with  . other    Follow up from [facility] ER; rapid heart beats, shortness of breath and HTN. Meds reviewed by the pt. verbally. Pt. c/o rapid irreg. heart beats.     HPI:  [name] [name] is a 81 yo woman with a history of atrial fibrillation, obesity, symptomatic palpitations/PVCs, status post pacemaker implantation for sick sinus syndrome/tachybradycardia syndrome with chronic symptoms of palpitations, shortness of breath with exertion, who presents for routine followup of her atrial fibrillation and shortness of breath  History of chronic fatigue  Recent pacer download discussed with her  Atrial and ventricular arrhythmias on pacer download Episodes of atrial fibrillation Seen by Dr. Lovena Le 07/2016, No med changes (as not having sx at that time)  Martin Majestic to hospital 11/09/16, SOB, tingling, called 911  anemia, HCT 29 Had a CT scan, Possible anxiety Got hot  On her visit today, complains (again) of tachycardia at night, very symptomatic Comes and goes, sometimes bad Tried taking more coreg (2 at bed, had nausea) Wants different medication regimen for atrial fib,sx at night Pounds in her ears, chest pressure, Has to get up out of bed and walk around  Pulmonary raised pressures on CPAP from 5 to 10 Did not help tachycardia at night  compliant with her CPAP.  Previous 30 day monitor concerning for episodes of atrial fibrillation  Hemoglobin A1c previously in the 8 range No regular exercise program Tolerating warfarin with no complications  EKG on today's visit shows paced rhythm  Other past medical history Recent Pacer changes 10/15, felt better Takes bystolic 20 BID Wears compressions on a regular basis  Previously on HCTZ, had abd cramps, ABD pains (now stopped) Finger fungus, on lamacil daily  June 2013 total  cholesterol 155, LDL 82, HDL 56  Other past medical history Previous hospital admission on 09/24/2013 for acute on chronic diastolic CHF. She was drinking a significant amount of fluids. She was treated with IV diuretics with improvement of her symptoms. Was recommended that she go home with Lasix every other day. She had a stress test while in the hospital showing no ischemia. Echocardiogram dated 09/24/2013 showing ejection fraction 50-55%,, mild to moderate tricuspid regurg, mild to moderate MR, normal RV function, normal right ventricular systolic pressure  ER again on 10/18/2013.  Previous hx of shortness of breath and palpitations. May 2012, she had adjustments to her pacemaker and since that time has felt back to normal. she has had long hx of hot flashes, and a racing heart rate when she is in bed. Recently this has been well-controlled on high-dose bystolic.  chronic back and hip pain. She did receive a cortisone shot on the left with no significant improvement. She reports that her sugars have been well-controlled, blood pressure well controlled on current medications  syncope episode August 2013 . in her bedroom folding clothes and the next thing she knew she found herself on the floor in the bathroom. She spent 2 days in the hospital, then at least 10 days in rehabilitation for significant left arm bruising and she was on warfarin. Pacemaker was interrogated after the fact showing 85% atrial paced rhythm, no other arrhythmia. Etiology concerning for vasovagal episode, though exact cause is uncertain.  She has chronic buzzing in her ear. She does report this is a chronic issue going back many  years. Prior workup with ear nose throat. She does have prior exposure to loud noises, worked in a [employer].  cardiac catheterization in 2004, stress testing,  echo showed normal systolic function, mild LVH, diastolic relaxation abnormality, mildly elevated right ventricular  systolic pressures, mild MR. treadmill study showing normal blood pressure response to exercise, chronotropic incompetence with peak heart rate of 100 beats per minute, resting heart rate in the 70s. Previous PFTs demonstrated extrinsic compression/restrictive physiology.  PMH:   has a past medical history of 1st degree AV block; Anemia; Anxiety; Arthritis; Chest pain; Chronic diastolic heart failure ([place]); Esophageal reflux; H/O hiatal hernia; History of UTI; Hyperlipidemia; Morbid obesity ([place]); OSA on CPAP; PAF (paroxysmal atrial fibrillation) ([place]); SSS (sick sinus syndrome) ([place]); Symptomatic PVCs; Syncope and collapse; Type II diabetes mellitus ([place]); Unspecified essential hypertension; and Unspecified glaucoma(365.9).  PSH:    Past Surgical History:  Procedure Laterality Date  . DILATION AND CURETTAGE OF UTERUS    . INCISION AND DRAINAGE OF WOUND Right 03/1998   "leg; it was like a boil" (12/01/2013)  . INSERT / REPLACE / REMOVE PACEMAKER  2008; 12/01/2013   MDT ADDRL1 pacemaker - gen change by Dr Caryl Comes 12/01/2013  . [facility]G [facility]OPSY  [facility]   [facility]  . PERMANENT PACEMAKER GENERATOR CHANGE N/A 12/01/2013   Procedure: PERMANENT PACEMAKER GENERATOR CHANGE;  Surgeon: Deboraha Sprang, MD;  Location: [facility] C[facility] LAB;  Service: Cardiovascular;  Laterality: N/A;  . VAGINAL HYSTERECTOMY  1970    Current Outpatient Prescriptions  Medication Sig Dispense Refill  . acetaminophen (TYLENOL) 500 MG tablet Take 500 mg by mouth every 6 (six) hours as needed.    . ALPRAZolam (XANAX) 0.5 MG tablet Take 1 tablet (0.5 mg total) by mouth daily as needed. 90 tablet 0  . amLODipine (NORVASC) 10 MG tablet TAKE 1 TABLET (10 MG TOTAL) BY MOUTH DAILY. 90 tablet 0  . atorvastatin (LIPITOR) 20 MG tablet TAKE 1 TABLET (20 MG TOTAL) BY MOUTH DAILY. 90 tablet 0  . Brinzolamide-Brimonidine (SIMBRINZA) 1-0.2 % SUSP Place 1 drop into both eyes 2 (two) times daily. 1 Bottle 1  . carvedilol (COREG) 12.5 MG tablet Take 1 tablet  (12.5 mg total) by mouth as directed. Take 1 tab in the morning with breakfast, 2 tabs in the evening before bed. (Patient taking differently: Take 12.5 mg by mouth as directed. Take 1 tab in the morning with breakfast, 2 tabs in the evening before bed.) 45 tablet 6  . esomeprazole (NEXIUM) 40 MG capsule TAKE 1 CAPSULE (40 MG TOTAL) BY MOUTH DAILY. 90 capsule 1  . furosemide (LASIX) 20 MG tablet TAKE 1 TABLET (20 MG TOTAL) BY MOUTH DAILY. 90 tablet 0  . glipiZIDE (GLUCOTROL) 10 MG tablet Take 1 tablet (10 mg total) by mouth 2 (two) times daily before a meal. 180 tablet 0  . metFORMIN (GLUCOPHAGE-XR) 500 MG 24 hr tablet TAKE 1 TABLET (500 MG TOTAL) BY MOUTH 2 (TWO) TIMES DAILY. (Patient taking differently: TAKE 1 TABLET (500 MG TOTAL) BY MOUTH 2 (TWO) TIMES DAILY.) 180 tablet 0  . mometasone (ELOCON) 0.1 % cream Apply topically daily. Apply to ear as directed two times a day for 10 days as needed 45 g 0  . Multiple Vitamins-Minerals (CENTRUM SILVER PO) Take by mouth daily.    Marland Kitchen olmesartan (BENICAR) 40 MG tablet TAKE 1 TABLET (40 MG TOTAL) BY MOUTH DAILY. 90 tablet 0  . TRADJENTA 5 MG TABS tablet TAKE 1 TABLET (5 MG TOTAL) BY MOUTH  DAILY. 30 tablet 2  . travoprost, benzalkonium, (TRAVATAN) 0.004 % ophthalmic solution Place 1 drop into both eyes at bedtime.     Marland Kitchen warfarin (COUMADIN) 3 MG tablet TAKE 1-1.5 TABLETS DAILY AT 6 PM. TAKE 1 TAB ON TUES, THURS, SAT, SUN. TAKE 4.5 MG ON MON, WED, FRI 100 tablet 3  . flecainide (TAMBOCOR) 50 MG tablet Take 1 tablet (50 mg total) by mouth 2 (two) times daily. 60 tablet 6   No current facility-administered medications for this visit.      Allergies:   Diltiazem; Metoprolol; and Levofloxacin in d5w   Social History:  The patient  reports that she quit smoking about 27 years ago. Her smoking use included Cigarettes. She has a 17.00 pack-year smoking history. She has never used smokeless tobacco. She reports that she does not drink alcohol or use drugs.   Family  History:   family history includes Allergies in her father; Heart disease in her father; Liver cancer in her mother; Pancreatic cancer in her mother; Rheum arthritis in her father.    Review of Systems: Review of Systems  Constitutional: Negative.   Respiratory: Negative.   Cardiovascular: Positive for palpitations.       Tachycardia  Gastrointestinal: Negative.   Musculoskeletal: Negative.   Neurological: Negative.   Psychiatric/Behavioral: Negative.   All other systems reviewed and are negative.    PHYSICAL EXAM: VS:  BP 120/62 (BP Location: Left Arm, Patient Position: Sitting, Cuff Size: Large)   Pulse 63   Ht 5\' 5"  (1.651 m)   Wt 241 lb 8 oz (109.5 kg)   BMI 40.19 kg/m  , BMI Body mass index is 40.19 kg/m. GEN: Well nourished, well developed, in no acute distress, obese  HEENT: normal  Neck: no JVD, carotid bruits, or masses Cardiac: RRR; no murmurs, rubs, or gallops,no edema  Respiratory:  clear to auscultation bilaterally, normal work of breathing GI: soft, nontender, nondistended, + BS MS: no deformity or atrophy  Skin: warm and dry, no rash Neuro:  Strength and sensation are intact Psych: euthymic mood, full affect    Recent Labs: 11/01/2016: ALT 14 11/09/2016: BUN 18; Creatinine, Ser 1.05; Hemoglobin 9.8; Platelets 208; Potassium 3.4; Sodium 142    Lipid Panel Lab Results  Component Value Date   CHOL 156 11/01/2016   HDL 58 11/01/2016   LDLCALC 79 11/01/2016   TRIG 93 11/01/2016      Wt Readings from Last 3 Encounters:  11/17/16 241 lb 8 oz (109.5 kg)  11/16/16 242 lb 11.2 oz (110.1 kg)  11/09/16 242 lb (109.8 kg)       ASSESSMENT AND PLAN:  Paroxysmal atrial fibrillation (HCC) - Plan: EKG 12-Lead Will start flecainide 50 mg twice a day Stay on coreg  SOB (shortness of breath) - Plan: EKG 12-Lead Etiology unclear, possible atrial fib, She does have underlying chronic diastolic CHF, morbid obesity, deconditioning  Palpitations - Plan: EKG  12-Lead As above, med changes for rhythm control  Essential hypertension - Plan: EKG 12-Lead Good today, No med changes  Acute on chronic diastolic CHF (congestive heart failure) (HCC) Euvolemic, stay on same meds  Controlled type 2 diabetes mellitus without complication, without long-term current use of insulin ([place]) We have encouraged continued exercise, careful diet management in an effort to lose weight.   Total encounter time more than 25 minutes  Greater than 50% was spent in counseling and coordination of care with the patient   Disposition:   F/U  6 months  Orders Placed This Encounter  Procedures  . EKG 12-Lead     Signed, Esmond Plants, M.D., Ph.D. 11/17/2016  [place], [place]  ,

## 2016-11-17 NOTE — Patient Instructions (Addendum)
Medication Instructions:   Please start flecainide one pill twice a day for palpitations/atyrial fibrillation  Labwork:  No new labs needed  Testing/Procedures:  We will call to facilitate a pacer download given episode of feeling hot, ER visit H/o ventricular and atrial arrhthymia   Routine treadmill in 3 weeks time, give start of flecainide   I recommend watching educational videos on topics of interest to you at:       [email or web address].[email or web address]  Enter code: HEARTCARE    Follow-Up: It was a pleasure seeing you in the office today. Please call us if you have new issues that need to be addressed before your next appt.  [phone]  Your physician wants you to follow-up in: 6 months.  You will receive a reminder letter in the mail two months in advance. If you don't receive a letter, please call our office to schedule the follow-up appointment.  If you need a refill on your cardiac medications before your next appointment, please call your pharmacy.     Exercise Stress Electrocardiogram An exercise stress electrocardiogram is a test to check how blood flows to your heart. It is done to find areas of poor blood flow. You will need to walk on a treadmill for this test. The electrocardiogram will record your heartbeat when you are at rest and when you are exercising. What happens before the procedure?  Do not have drinks with caffeine or foods with caffeine for 24 hours before the test, or as told by your doctor. This includes coffee, tea (even decaf tea), sodas, chocolate, and cocoa.  Follow your doctor's instructions about eating and drinking before the test.  Ask your doctor what medicines you should or should not take before the test. Take your medicines with water unless told by your doctor not to.  If you use an inhaler, bring it with you to the test.  Bring a snack to eat after the test.  Do not  smoke for 4 hours before the test.  Do not put lotions, powders, creams, or  oils on your chest before the test.  Wear comfortable shoes and clothing. What happens during the procedure?  You will have patches put on your chest. Small areas of your chest may need to be shaved. Wires will be connected to the patches.  Your heart rate will be watched while you are resting and while you are exercising.  You will walk on the treadmill. The treadmill will slowly get faster to raise your heart rate.  The test will take about 1-2 hours. What happens after the procedure?  Your heart rate and blood pressure will be watched after the test.  You may return to your normal diet, activities, and medicines or as told by your doctor. This information is not intended to replace advice given to you by your health care provider. Make sure you discuss any questions you have with your health care provider. Document Released: 05/08/2008 Document Revised: 07/19/2016 Document Reviewed: 07/28/2013 Elsevier Interactive Patient Education  2017 [employer].

## 2016-11-20 ENCOUNTER — Other Ambulatory Visit: Payer: Self-pay | Admitting: Family Medicine

## 2016-11-23 ENCOUNTER — Telehealth: Payer: Self-pay | Admitting: Cardiovascular Disease

## 2016-11-23 NOTE — Telephone Encounter (Signed)
S/w Kennyth Lose, patient's daughter, present with patient. Patient talking in the background. Patient stated she went to bed at 11:30pm last night, then woke up at 1230 in the night feeling and hearing pounding in her ears. She took her BP  148/75.  Denies feeling chest pain, SOB, or palpitations. She says sometimes she feels chest pressure with these episodes but did not last night. Currently, she still hears "pounding in her ears" but its not as bad. Denies SOB, palpitations, chest pain or dizziness. She was started on Flecainide on 11/17/16 and has been taking as prescribed with the exception of taking an extra dose of flecainide 50 mg last night when she could hear her heart pounding in her ears. She stated Dr Rockey Situ had told her to do that. Could not find that in his note. Patient stated these are the same symptoms she's been having and what took her to the ER on 11/09/16.  Appt made with Ignacia Bayley, NP for 11/24/16. Patient and daughter verbalized understanding.

## 2016-11-23 NOTE — Telephone Encounter (Signed)
Medication has been refilled and sent to [facility] [address] 

## 2016-11-23 NOTE — Telephone Encounter (Signed)
Pt states she is still having episodes of tachycardia. States she only slept for 1 hour last night. States there is a "pounding" in her ears, she cannot hear. Please call. Pt states she just started taking Flecainide, but it is not helping.

## 2016-11-23 NOTE — Telephone Encounter (Signed)
Reviewed patient's encounter with [name] Bayley, NP who is present in the office.  He advised it would be best for patient to be seen in the EP clinic at [facility] office. He also recommended patient to take an extra carvedilol 6.25 mg (1/2 tablet) as needed for palpitations and advised against taking extra flecainide.   Returned call to patient's daughter.  Gave her [name] [name]'s recommendation. She agreed with plan. Appt cancelled for 11/24/16 and made with [name] [name] at [address] for 11/30/16. Daughter verbalized understanding of appt date and time. Verbalized understanding to proceed to nearest ER or call 911 if symptoms worsen.

## 2016-11-23 NOTE — Telephone Encounter (Signed)
No answer. Left message to call back.   

## 2016-11-24 ENCOUNTER — Ambulatory Visit: Payer: Medicare Other | Admitting: Nurse Practitioner

## 2016-11-29 DIAGNOSIS — N2581 Secondary hyperparathyroidism of renal origin: Secondary | ICD-10-CM | POA: Diagnosis not present

## 2016-11-29 DIAGNOSIS — I10 Essential (primary) hypertension: Secondary | ICD-10-CM | POA: Diagnosis not present

## 2016-11-29 DIAGNOSIS — N183 Chronic kidney disease, stage 3 (moderate): Secondary | ICD-10-CM | POA: Diagnosis not present

## 2016-11-29 DIAGNOSIS — R600 Localized edema: Secondary | ICD-10-CM | POA: Diagnosis not present

## 2016-11-29 DIAGNOSIS — D631 Anemia in chronic kidney disease: Secondary | ICD-10-CM | POA: Diagnosis not present

## 2016-11-30 ENCOUNTER — Ambulatory Visit (INDEPENDENT_AMBULATORY_CARE_PROVIDER_SITE_OTHER): Payer: Medicare Other | Admitting: Physician Assistant

## 2016-11-30 VITALS — BP 134/75 | HR 74 | Ht 65.0 in | Wt 246.0 lb

## 2016-11-30 DIAGNOSIS — I5033 Acute on chronic diastolic (congestive) heart failure: Secondary | ICD-10-CM | POA: Diagnosis not present

## 2016-11-30 DIAGNOSIS — R42 Dizziness and giddiness: Secondary | ICD-10-CM | POA: Diagnosis not present

## 2016-11-30 DIAGNOSIS — I10 Essential (primary) hypertension: Secondary | ICD-10-CM

## 2016-11-30 DIAGNOSIS — R002 Palpitations: Secondary | ICD-10-CM

## 2016-11-30 DIAGNOSIS — I480 Paroxysmal atrial fibrillation: Secondary | ICD-10-CM

## 2016-11-30 NOTE — Progress Notes (Signed)
Cardiology Office Note Date:  11/30/2016  Patient ID:  [name] [name], [name] [name] [birth year], MRN [id number] PCP:  Keith Rake, MD  Cardiologist:  Dr. Rockey Situ Electrophysiologist: Dr. Lovena Le   Chief Complaint:  palpitations  History of Present Illness: [name] [name] is a 81 y.o. female with history of Paroxysmal AFib, HTN, obesity, HLD, OSA w/CPAP, DM, comes in today to be seen for Dr. Lovena Le, last seen by him in August at that time, doing well with few palpitations.  More recently saw Dr. Rockey Situ 11/17/16 he noted a recent ER visit with c/p palpitations/tingling in follow up with him for this  "On her visit today, complains (again) of tachycardia at night, very symptomatic Comes and goes, sometimes bad Tried taking more coreg (2 at bed, had nausea) Wants different medication regimen for atrial fib,sx at night Pounds in her ears, chest pressure, Has to get up out of bed and walk around Pulmonary raised pressures on CPAP from 5 to 10 Did not help tachycardia at night compliant with her CPAP."  She was started on Flecainide 50mg  BID and scheduled for f/u stress testing.  The patient tells me that since august she is waking every single night without fail with her hear pounding in her ears.  She does not perceive palpitations or racing in her chest but a pounding sensastion/can hear her heart beating in her ears.  No CP, no near syncope or syncope.  She will feel fleetingly light headed when she stands quickly or stand up from bending over.  She mentions she has a vague sensation of feeling lightheaded, no near syncopal, no syncope, no headaches or focal symptoms, and while here says she is feeling what she is perceiving as her AFib which is the pounding heart beats in her ears.  She mentions a slight chest discomfort on occasion that she discussed with Dr. Rockey Situ and is pending a stress test.  No SOB, no symptoms of PND or orthopnea.  She tells me her PMD monitors and manages her coumadin, denies any  bleeding or signs of bleeding.  Device information: MDT dual chamber PPM, gen change 12/01/13, original implant 2008, Dr. Caryl Comes, sinus node dysfunction  AFib Hx AAD Flecainide started Dec 2107 coumadin    Past Medical History:  Diagnosis Date  . 1st degree AV block    a. PR 400 msec with Apacing  . Anemia   . Anxiety   . Arthritis    a. shoulders.  . Chest pain    a. 2004 Cath:  reportedly nl;  b. 09/2013 Neg MV.  . Chronic diastolic heart failure ([place])    a. 09/2013 Echo: EF 50-55%, mild LVH, mild to mod MR/TR.  Marland Kitchen Esophageal reflux   . H/O hiatal hernia   . History of UTI   . Hyperlipidemia   . Morbid obesity ([place])   . OSA on CPAP   . PAF (paroxysmal atrial fibrillation) ([place])   . SSS (sick sinus syndrome) ([place])    a. 2008 s/p MDT PPM;  b. 11/2013 Gen change- MDT ADDRL1 Adapta DC PPM, ser # [id number] H.  . Symptomatic PVCs   . Syncope and collapse    a. Felt to be vasovagal.  . Type II diabetes mellitus ([place])   . Unspecified essential hypertension   . Unspecified glaucoma(365.9)     Past Surgical History:  Procedure Laterality Date  . DILATION AND CURETTAGE OF UTERUS    . INCISION AND DRAINAGE OF WOUND Right 03/1998   "leg; it was  like a boil" (12/01/2013)  . INSERT / REPLACE / REMOVE PACEMAKER  2008; 12/01/2013   MDT ADDRL1 pacemaker - gen change by Dr Caryl Comes 12/01/2013  . LUNG [facility]OPSY  2010   [facility]  . PERMANENT PACEMAKER GENERATOR CHANGE N/A 12/01/2013   Procedure: PERMANENT PACEMAKER GENERATOR CHANGE;  Surgeon: Deboraha Sprang, MD;  Location: [facility] C[facility] LAB;  Service: Cardiovascular;  Laterality: N/A;  . VAGINAL HYSTERECTOMY  1970    Current Outpatient Prescriptions  Medication Sig Dispense Refill  . acetaminophen (TYLENOL) 500 MG tablet Take 500 mg by mouth every 6 (six) hours as needed.    . ALPRAZolam (XANAX) 0.5 MG tablet Take 1 tablet (0.5 mg total) by mouth daily as needed. 90 tablet 0  . amLODipine (NORVASC) 10 MG tablet TAKE 1 TABLET (10 MG TOTAL) BY MOUTH  DAILY. 90 tablet 0  . atorvastatin (LIPITOR) 20 MG tablet TAKE 1 TABLET (20 MG TOTAL) BY MOUTH DAILY. 90 tablet 0  . Brinzolamide-Brimonidine (SIMBRINZA) 1-0.2 % SUSP Place 1 drop into both eyes 2 (two) times daily. 1 Bottle 1  . carvedilol (COREG) 12.5 MG tablet Take 1 tablet (12.5 mg total) by mouth as directed. Take 1 tab in the morning with breakfast, 2 tabs in the evening before bed. (Patient taking differently: Take 12.5 mg by mouth as directed. Take 1 tab in the morning with breakfast, 2 tabs in the evening before bed.) 45 tablet 6  . esomeprazole (NEXIUM) 40 MG capsule TAKE 1 CAPSULE (40 MG TOTAL) BY MOUTH DAILY. 90 capsule 1  . flecainide (TAMBOCOR) 50 MG tablet Take 1 tablet (50 mg total) by mouth 2 (two) times daily. 60 tablet 6  . furosemide (LASIX) 20 MG tablet TAKE 1 TABLET (20 MG TOTAL) BY MOUTH DAILY. 90 tablet 0  . glipiZIDE (GLUCOTROL) 10 MG tablet Take 1 tablet (10 mg total) by mouth 2 (two) times daily before a meal. 180 tablet 0  . metFORMIN (GLUCOPHAGE-XR) 500 MG 24 hr tablet TAKE 1 TABLET (500 MG TOTAL) BY MOUTH 2 (TWO) TIMES DAILY. 180 tablet 0  . mometasone (ELOCON) 0.1 % cream Apply topically daily. Apply to ear as directed two times a day for 10 days as needed 45 g 0  . Multiple Vitamins-Minerals (CENTRUM SILVER PO) Take by mouth daily.    Marland Kitchen olmesartan (BENICAR) 40 MG tablet TAKE 1 TABLET (40 MG TOTAL) BY MOUTH DAILY. 90 tablet 0  . TRADJENTA 5 MG TABS tablet TAKE 1 TABLET (5 MG TOTAL) BY MOUTH DAILY. 30 tablet 2  . travoprost, benzalkonium, (TRAVATAN) 0.004 % ophthalmic solution Place 1 drop into both eyes at bedtime.     Marland Kitchen warfarin (COUMADIN) 3 MG tablet TAKE 1-1.5 TABLETS DAILY AT 6 PM. TAKE 1 TAB ON TUES, THURS, SAT, SUN. TAKE 4.5 MG ON MON, WED, FRI 100 tablet 3   No current facility-administered medications for this visit.     Allergies:   Diltiazem; Metoprolol; and Levofloxacin in d5w   Social History:  The patient  reports that she quit smoking about 27 years  ago. Her smoking use included Cigarettes. She has a 17.00 pack-year smoking history. She has never used smokeless tobacco. She reports that she does not drink alcohol or use drugs.   Family History:  The patient's family history includes Allergies in her father; Heart disease in her father; Liver cancer in her mother; Pancreatic cancer in her mother; Rheum arthritis in her father.  ROS:  Please see the history of present illness.  All other  systems are reviewed and otherwise negative.   PHYSICAL EXAM:  VS:  BP 134/75   Pulse 74   Ht 5\' 5"  (1.651 m)   Wt 246 lb (111.6 kg)   BMI 40.94 kg/m  BMI: Body mass index is 40.94 kg/m. Well nourished, well developed, in no acute distress  HEENT: normocephalic, atraumatic  Neck: no JVD, carotid bruits or masses Cardiac:  RRR; no significant murmurs, no rubs, or gallops Lungs:  clear to auscultation bilaterally, no wheezing, rhonchi or rales  Abd: soft, nontender MS: no deformity or atrophy Ext: no edema  Skin: warm and dry, no rash Neuro:  No gross deficits appreciated Psych: euthymic mood, full affect  PPM site is stable, no tethering or discomfort   EKG:  Done 11/09/16 was A paced, V sensed, QRS 32ms PPM check done today and reviewed by myself: stable lead and battery status.  She has had some AF, though only 0.3% burden and nothing daily or nightly to correlate with her symptom,  The longest duration 3:36hrs.  She has had HVR noted, EGS reviewed appear ATach, once looks NSVT 3 seconds  09/24/13: TTE LVEF 50-55% Mild LVH Mild-mod MR/TR Normal RVSP  Recent Labs: 11/01/2016: ALT 14 11/09/2016: BUN 18; Creatinine, Ser 1.05; Hemoglobin 9.8; Platelets 208; Potassium 3.4; Sodium 142  11/01/2016: Cholesterol 156; HDL 58; LDL Cholesterol 79; Total CHOL/HDL Ratio 2.7; Triglycerides 93; VLDL 19   Estimated Creatinine Clearance: 54 mL/min (by C-G formula based on SCr of 1.05 mg/dL (H)).   Wt Readings from Last 3 Encounters:  11/30/16 246 lb  (111.6 kg)  11/17/16 241 lb 8 oz (109.5 kg)  11/16/16 242 lb 11.2 oz (110.1 kg)     Other studies reviewed: Additional studies/records reviewed today include: summarized above  ASSESSMENT AND PLAN:  1. Paroxysmal AFib     CHA2DS2Vasc is at least 5, on warfarin     Started on Flecainide (w/coreg)     Given she does have some AF, d/w Dr. Lovena Le, will continue      2. PPM     normal device function, no changes made  3. HTN     Stable  4. Symptoms as described     Her BP, HR, rhythm are all OK today with reports of ongoing symptom here     She is having some AF though does not correlate with her symptoms     She is recommended to see her PMD to evaluate any potential non-cardiac etiologies.  Will send her for carotid US.   Disposition: 3 month remote transmission, Dr. Lovena Le in 6 months sooner if needed pending her test reuslt,  She has stress testing scheduled via Dr. Rockey Situ for next month and will f/u with him as well.   Current medicines are reviewed at length with the patient today.  The patient did not have any concerns regarding medicines.*  Signed, Jennings Books, PA-C 11/30/2016 4:47 PM     [address] [place] [address] [phone] (office)  [phone] (fax)

## 2016-11-30 NOTE — Patient Instructions (Addendum)
Medication Instructions:    Your physician recommends that you continue on your current medications as directed. Please refer to the Current Medication list given to you today.   If you need a refill on your cardiac medications before your next appointment, please call your pharmacy.  Labwork:  NONE ORDERED  TODAY    Testing/Procedures: Your physician has requested that you have a carotid duplex. This test is an ultrasound of the carotid arteries in your neck. It looks at blood flow through these arteries that supply the brain with blood. Allow one hour for this exam. There are no restrictions or special instructions.   Follow-Up: Your physician wants you to follow-up in:  [place] will receive a reminder letter in the mail two months in advance. If you don't receive a letter, please call our office to schedule the follow-up appointment.   Remote monitoring is used to monitor your Pacemaker of ICD from home. This monitoring reduces the number of office visits required to check your device to one time per year. It allows [place] to keep an eye on the functioning of your device to ensure it is working properly. You are scheduled for a device check from home on ..03/01/2017.. You may send your transmission at any time that day. If you have a wireless device, the transmission will be sent automatically. After your physician reviews your transmission, you will receive a postcard with your next transmission date.     Any Other Special Instructions Will Be Listed Below (If Applicable).

## 2016-12-01 ENCOUNTER — Other Ambulatory Visit: Payer: Self-pay | Admitting: Physician Assistant

## 2016-12-01 DIAGNOSIS — H9319 Tinnitus, unspecified ear: Secondary | ICD-10-CM

## 2016-12-01 DIAGNOSIS — R42 Dizziness and giddiness: Secondary | ICD-10-CM

## 2016-12-06 ENCOUNTER — Ambulatory Visit (INDEPENDENT_AMBULATORY_CARE_PROVIDER_SITE_OTHER): Payer: Medicare Other

## 2016-12-06 DIAGNOSIS — E119 Type 2 diabetes mellitus without complications: Secondary | ICD-10-CM

## 2016-12-06 DIAGNOSIS — I480 Paroxysmal atrial fibrillation: Secondary | ICD-10-CM | POA: Diagnosis not present

## 2016-12-06 DIAGNOSIS — I10 Essential (primary) hypertension: Secondary | ICD-10-CM | POA: Diagnosis not present

## 2016-12-06 DIAGNOSIS — R0602 Shortness of breath: Secondary | ICD-10-CM | POA: Diagnosis not present

## 2016-12-06 DIAGNOSIS — R002 Palpitations: Secondary | ICD-10-CM | POA: Diagnosis not present

## 2016-12-06 DIAGNOSIS — I5033 Acute on chronic diastolic (congestive) heart failure: Secondary | ICD-10-CM

## 2016-12-06 LAB — EXERCISE TOLERANCE TEST
Estimated workload: 4 METS
Exercise duration (min): 1 min
Exercise duration (sec): 43 s
MPHR: 141 {beats}/min
Peak HR: 95 {beats}/min
Percent HR: 67 %
Rest HR: 72 {beats}/min

## 2016-12-07 ENCOUNTER — Other Ambulatory Visit: Payer: Self-pay | Admitting: Family Medicine

## 2016-12-07 DIAGNOSIS — G4733 Obstructive sleep apnea (adult) (pediatric): Secondary | ICD-10-CM | POA: Diagnosis not present

## 2016-12-07 NOTE — Telephone Encounter (Signed)
Medication has been refilled and sent to [facility] [address] 

## 2016-12-08 ENCOUNTER — Other Ambulatory Visit: Payer: Self-pay

## 2016-12-08 ENCOUNTER — Inpatient Hospital Stay (HOSPITAL_COMMUNITY): Admission: RE | Admit: 2016-12-08 | Payer: Medicare Other | Source: Ambulatory Visit

## 2016-12-08 DIAGNOSIS — R5381 Other malaise: Secondary | ICD-10-CM

## 2016-12-08 DIAGNOSIS — R0602 Shortness of breath: Secondary | ICD-10-CM

## 2016-12-11 ENCOUNTER — Telehealth: Payer: Self-pay | Admitting: Emergency Medicine

## 2016-12-11 NOTE — Telephone Encounter (Signed)
She should schedule an appointment for evaluation of symptoms and appropriate therapy

## 2016-12-11 NOTE — Telephone Encounter (Signed)
Patient called and stated she was coughing, congested, blowing yellow phlegm from nose. Would like a z-pak called into pharmacy.

## 2016-12-12 ENCOUNTER — Ambulatory Visit (HOSPITAL_COMMUNITY)
Admission: RE | Admit: 2016-12-12 | Discharge: 2016-12-12 | Disposition: A | Discharge status: Home / Self Care | Payer: Medicare Other | Source: Ambulatory Visit | Attending: Cardiology | Admitting: Cardiology

## 2016-12-12 DIAGNOSIS — H9319 Tinnitus, unspecified ear: Secondary | ICD-10-CM | POA: Diagnosis not present

## 2016-12-12 DIAGNOSIS — R42 Dizziness and giddiness: Secondary | ICD-10-CM

## 2016-12-12 DIAGNOSIS — I6523 Occlusion and stenosis of bilateral carotid arteries: Secondary | ICD-10-CM | POA: Insufficient documentation

## 2016-12-12 DIAGNOSIS — E079 Disorder of thyroid, unspecified: Secondary | ICD-10-CM | POA: Diagnosis not present

## 2016-12-12 DIAGNOSIS — E119 Type 2 diabetes mellitus without complications: Secondary | ICD-10-CM | POA: Diagnosis not present

## 2016-12-12 DIAGNOSIS — I10 Essential (primary) hypertension: Secondary | ICD-10-CM | POA: Insufficient documentation

## 2016-12-12 NOTE — Telephone Encounter (Signed)
Patient notified, will call her with appointment for tomorrow

## 2016-12-13 ENCOUNTER — Ambulatory Visit (INDEPENDENT_AMBULATORY_CARE_PROVIDER_SITE_OTHER): Payer: Medicare Other | Admitting: Family Medicine

## 2016-12-13 ENCOUNTER — Encounter: Payer: Self-pay | Admitting: Family Medicine

## 2016-12-13 VITALS — BP 124/76 | HR 87 | Temp 98.1°F | Resp 18 | Ht 65.0 in | Wt 245.3 lb

## 2016-12-13 DIAGNOSIS — Z5181 Encounter for therapeutic drug level monitoring: Secondary | ICD-10-CM

## 2016-12-13 DIAGNOSIS — Z7901 Long term (current) use of anticoagulants: Secondary | ICD-10-CM

## 2016-12-13 DIAGNOSIS — J069 Acute upper respiratory infection, unspecified: Secondary | ICD-10-CM | POA: Insufficient documentation

## 2016-12-13 LAB — POCT INR: INR: 1.7

## 2016-12-13 MED ORDER — AMOXICILLIN-POT CLAVULANATE 875-125 MG PO TABS: 1.0000 | ORAL_TABLET | Freq: Two times a day (BID) | ORAL | 0 refills | Status: DC

## 2016-12-13 NOTE — Telephone Encounter (Signed)
Patient came for appointment 

## 2016-12-13 NOTE — Progress Notes (Signed)
Name: [name] [name]   MRN: [id number]    DOB: Apr 12, 1935   Date:12/13/2016       Progress Note  Subjective  Chief Complaint  Chief Complaint  Patient presents with  . URI    cough, congested for 8 days    Sinusitis  This is a new problem. The current episode started in the past 7 days (5 days ago). The problem has been gradually improving since onset. There has been no fever. Associated symptoms include congestion and coughing. Pertinent negatives include no chills, sinus pressure or sore throat.    Past Medical History:  Diagnosis Date  . 1st degree AV block    a. PR 400 msec with Apacing  . Anemia   . Anxiety   . Arthritis    a. shoulders.  . Chest pain    a. 2004 Cath:  reportedly nl;  b. 09/2013 Neg MV.  . Chronic diastolic heart failure ([place])    a. 09/2013 Echo: EF 50-55%, mild LVH, mild to mod MR/TR.  Marland Kitchen Esophageal reflux   . H/O hiatal hernia   . History of UTI   . Hyperlipidemia   . Morbid obesity ([place])   . OSA on CPAP   . PAF (paroxysmal atrial fibrillation) ([place])   . SSS (sick sinus syndrome) ([place])    a. 2008 s/p MDT PPM;  b. 11/2013 Gen change- MDT ADDRL1 Adapta DC PPM, ser # [id number] H.  . Symptomatic PVCs   . Syncope and collapse    a. Felt to be vasovagal.  . Type II diabetes mellitus ([place])   . Unspecified essential hypertension   . Unspecified glaucoma(365.9)     Past Surgical History:  Procedure Laterality Date  . DILATION AND CURETTAGE OF UTERUS    . INCISION AND DRAINAGE OF WOUND Right 03/1998   "leg; it was like a boil" (12/01/2013)  . INSERT / REPLACE / REMOVE PACEMAKER  2008; 12/01/2013   MDT ADDRL1 pacemaker - gen change by Dr Caryl Comes 12/01/2013  . L[facility]G BIOPSY  2010   [facility]  . PERMANENT PACEMAKER GENERATOR CHANGE N/A 12/01/2013   Procedure: PERMANENT PACEMAKER GENERATOR CHANGE;  Surgeon: Deboraha Sprang, MD;  Location[facility] CATH LAB;  Service: Cardiovascular;  Laterality: N/A;  . VAGINAL HYSTERECTOMY  1970    Family History  Problem Relation Age  of Onset  . Heart disease Father   . Rheum arthritis Father   . Allergies Father   . Liver cancer Mother   . Pancreatic cancer Mother     Social History   Social History  . Marital status: Widowed    Spouse name: N/A  . Number of children: Y  . Years of education: N/A   Occupational History  . retired    Social History Main Topics  . Smoking status: Former Smoker    Packs/day: 0.50    Years: 34.00    Types: Cigarettes    Quit date: 08/15/1989  . Smokeless tobacco: Never Used  . Alcohol use No  . Drug use: No  . Sexual activity: No   Other Topics Concern  . Not on file   Social History Narrative   Retired. Widowed. Regularly exercises.      Current Outpatient Prescriptions:  .  acetaminophen (TYLENOL) 500 MG tablet, Take 500 mg by mouth every 6 (six) hours as needed., Disp: , Rfl:  .  ALPRAZolam (XANAX) 0.5 MG tablet, Take 1 tablet (0.5 mg total) by mouth daily as needed., Disp: 90 tablet, Rfl: 0 .  amLODipine (NORVASC) 10 MG tablet, TAKE 1 TABLET (10 MG TOTAL) BY MOUTH DAILY., Disp: 90 tablet, Rfl: 0 .  atorvastatin (LIPITOR) 20 MG tablet, TAKE 1 TABLET (20 MG TOTAL) BY MOUTH DAILY., Disp: 90 tablet, Rfl: 0 .  Brinzolamide-Brimonidine (SIMBRINZA) 1-0.2 % SUSP, Place 1 drop into both eyes 2 (two) times daily., Disp: 1 Bottle, Rfl: 1 .  carvedilol (COREG) 12.5 MG tablet, Take 1 tablet (12.5 mg total) by mouth as directed. Take 1 tab in the morning with breakfast, 2 tabs in the evening before bed. (Patient taking differently: Take 12.5 mg by mouth as directed. Take 1 tab in the morning with breakfast, 2 tabs in the evening before bed.), Disp: 45 tablet, Rfl: 6 .  esomeprazole (NEXIUM) 40 MG capsule, TAKE 1 CAPSULE (40 MG TOTAL) BY MOUTH DAILY., Disp: 90 capsule, Rfl: 1 .  flecainide (TAMBOCOR) 50 MG tablet, Take 1 tablet (50 mg total) by mouth 2 (two) times daily., Disp: 60 tablet, Rfl: 6 .  furosemide (LASIX) 20 MG tablet, TAKE 1 TABLET (20 MG TOTAL) BY MOUTH DAILY., Disp:  90 tablet, Rfl: 0 .  glipiZIDE (GLUCOTROL) 10 MG tablet, Take 1 tablet (10 mg total) by mouth 2 (two) times daily before a meal., Disp: 180 tablet, Rfl: 0 .  metFORMIN (GLUCOPHAGE-XR) 500 MG 24 hr tablet, TAKE 1 TABLET (500 MG TOTAL) BY MOUTH 2 (TWO) TIMES DAILY., Disp: 180 tablet, Rfl: 0 .  mometasone (ELOCON) 0.1 % cream, Apply topically daily. Apply to ear as directed two times a day for 10 days as needed, Disp: 45 g, Rfl: 0 .  Multiple Vitamins-Minerals (CENTRUM SILVER PO), Take by mouth daily., Disp: , Rfl:  .  olmesartan (BENICAR) 40 MG tablet, TAKE 1 TABLET (40 MG TOTAL) BY MOUTH DAILY., Disp: 90 tablet, Rfl: 0 .  TRADJENTA 5 MG TABS tablet, TAKE 1 TABLET (5 MG TOTAL) BY MOUTH DAILY., Disp: 30 tablet, Rfl: 2 .  travoprost, benzalkonium, (TRAVATAN) 0.004 % ophthalmic solution, Place 1 drop into both eyes at bedtime. , Disp: , Rfl:  .  warfarin (COUMADIN) 3 MG tablet, TAKE 1-1.5 TABLETS DAILY AT 6 PM. TAKE 1 TAB ON TUES, THURS, SAT, SUN. TAKE 4.5 MG ON MON, WED, FRI, Disp: 100 tablet, Rfl: 3  Allergies  Allergen Reactions  . Diltiazem Other (See Comments)    Patient stated that she had bad dreams with this medication  . Metoprolol Other (See Comments)    Bad dreams Patient stated that she had suicidal thoughts while taking this medication.  . Levofloxacin In D5w Nausea Only and Nausea And Vomiting     Review of Systems  Constitutional: Negative for chills and fever.  HENT: Positive for congestion. Negative for sinus pain, sinus pressure and sore throat.   Respiratory: Positive for cough and sputum production.   Cardiovascular: Negative for chest pain.      Objective  Vitals:   12/13/16 1110  BP: 124/76  Pulse: 87  Resp: 18  Temp: 98.1 F (36.7 C)  TempSrc: Oral  SpO2: 95%  Weight: 245 lb 4.8 oz (111.3 kg)  Height: 5\' 5"  (1.651 m)    Physical Exam  Constitutional: She is well-developed, well-nourished, and in no distress.  HENT:  Right Ear: Tympanic membrane and  ear canal normal.  Left Ear: Tympanic membrane and ear canal normal.  Nose: Right sinus exhibits no maxillary sinus tenderness and no frontal sinus tenderness. Left sinus exhibits no maxillary sinus tenderness and no frontal sinus tenderness.  Mouth/Throat: No posterior  oropharyngeal erythema.  Cardiovascular: Normal rate, regular rhythm and normal heart sounds.   No murmur heard. Pulmonary/Chest: Effort normal and breath sounds normal. She has no wheezes.  Nursing note and vitals reviewed.    Assessment & Plan  1. URI with cough and congestion Appears to be resolving however still with persistent congestion, sinus drainage, we'll start on antibiotics - amoxicillin-clavulanate (AUGMENTIN) 875-125 MG tablet; Take 1 tablet by mouth 2 (two) times daily.  Dispense: 20 tablet; Refill: 0  2. Anticoagulation management encounter INR is 1.7, advised to take Coumadin 4.5 mg on Wednesday, Thursday, and Friday (1/10, 1/11, and 1/12), resume her usual dosage on 1/13 and recheck in 1 week. - POCT INR  Tyri Elmore Asad A[facility] 12/13/2016 11:40 AM

## 2016-12-13 NOTE — Telephone Encounter (Signed)
PT IS ASKING IF YOU WILL CALL HER. SHE SAID THAT SHE WAS TO HAVE RECEIVED A CALL YESTERDAY AND A MESSAGE LEFT ON HER PHONE IF NO ANSWER, BUT SHE HAD [place].

## 2016-12-14 DIAGNOSIS — H40153 Residual stage of open-angle glaucoma, bilateral: Secondary | ICD-10-CM | POA: Diagnosis not present

## 2016-12-15 ENCOUNTER — Telehealth: Payer: Self-pay | Admitting: Physician Assistant

## 2016-12-15 NOTE — Telephone Encounter (Signed)
New message ° ° ° °Pt verbalized that she is returning call for rn  °

## 2016-12-15 NOTE — Telephone Encounter (Signed)
Called the patient to discuss her carotid US findings.  Left a message at the listed home number for her to call for the test results.  Tommye Standard, PA-c

## 2016-12-17 ENCOUNTER — Other Ambulatory Visit: Payer: Self-pay | Admitting: Family Medicine

## 2016-12-18 NOTE — Telephone Encounter (Signed)
Patient is returning call for results. Please call, thanks.

## 2016-12-19 ENCOUNTER — Encounter: Payer: Medicare Other | Attending: Cardiovascular Disease | Admitting: Respiratory Therapy

## 2016-12-19 ENCOUNTER — Encounter: Payer: Self-pay | Admitting: Family Medicine

## 2016-12-19 ENCOUNTER — Ambulatory Visit (INDEPENDENT_AMBULATORY_CARE_PROVIDER_SITE_OTHER): Payer: Medicare Other | Admitting: Family Medicine

## 2016-12-19 ENCOUNTER — Other Ambulatory Visit: Payer: Self-pay | Admitting: Family Medicine

## 2016-12-19 VITALS — Ht 65.0 in | Wt 242.8 lb

## 2016-12-19 VITALS — BP 128/74 | HR 80 | Temp 98.1°F | Resp 16 | Ht 65.0 in | Wt 242.6 lb

## 2016-12-19 DIAGNOSIS — J0100 Acute maxillary sinusitis, unspecified: Secondary | ICD-10-CM | POA: Diagnosis not present

## 2016-12-19 DIAGNOSIS — R0602 Shortness of breath: Secondary | ICD-10-CM | POA: Insufficient documentation

## 2016-12-19 DIAGNOSIS — Z7901 Long term (current) use of anticoagulants: Secondary | ICD-10-CM | POA: Diagnosis not present

## 2016-12-19 DIAGNOSIS — I5032 Chronic diastolic (congestive) heart failure: Secondary | ICD-10-CM | POA: Diagnosis not present

## 2016-12-19 DIAGNOSIS — Z781 Physical restraint status: Secondary | ICD-10-CM | POA: Diagnosis not present

## 2016-12-19 LAB — POCT INR: INR: 2

## 2016-12-19 MED ORDER — FLUTICASONE PROPIONATE 50 MCG/ACT NA SUSP: 2.0000 | Freq: Every day | NASAL | 0 refills | Status: DC

## 2016-12-19 MED ORDER — DOXYCYCLINE HYCLATE 100 MG PO TABS: 100.0000 mg | ORAL_TABLET | Freq: Two times a day (BID) | ORAL | 0 refills | Status: DC

## 2016-12-19 NOTE — Progress Notes (Signed)
Name: [name] [name]   MRN: [id number]    DOB: December 23, 1934   Date:12/19/2016       Progress Note  Subjective  Chief Complaint  Chief Complaint  Patient presents with  . Sinus Problem  . Nasal Congestion    Sinusitis  This is a new problem. The current episode started 1 to 4 weeks ago (2 weeks ago). The problem is unchanged. There has been no fever. Associated symptoms include congestion, coughing, sinus pressure, sneezing and a sore throat.     Past Medical History:  Diagnosis Date  . 1st degree AV block    a. PR 400 msec with Apacing  . Anemia   . Anxiety   . Arthritis    a. shoulders.  . Chest pain    a. 2004 Cath:  reportedly nl;  b. 09/2013 Neg MV.  . Chronic diastolic heart failure ([place])    a. 09/2013 Echo: EF 50-55%, mild LVH, mild to mod MR/TR.  Marland Kitchen Esophageal reflux   . H/O hiatal hernia   . History of UTI   . Hyperlipidemia   . Morbid obesity ([place])   . OSA on CPAP   . PAF (paroxysmal atrial fibrillation) ([place])   . SSS (sick sinus syndrome) ([place])    a. 2008 s/p MDT PPM;  b. 11/2013 Gen change- MDT ADDRL1 Adapta DC PPM, ser # [id number] H.  . Symptomatic PVCs   . Syncope and collapse    a. Felt to be vasovagal.  . Type II diabetes mellitus ([place])   . Unspecified essential hypertension   . Unspecified glaucoma(365.9)     Past Surgical History:  Procedure Laterality Date  . DILATION AND CURETTAGE OF UTERUS    . INCISION AND DRAINAGE OF WOUND Right 03/1998   "leg; it was like a boil" (12/01/2013)  . INSERT / REPLACE / REMOVE PACEMAKER  2008; 12/01/2013   MDT ADDRL1 pacemaker - gen change by Dr Caryl Comes 12/01/2013  . LUNG BIOPSY  2010   [facility]  . PERMANENT PACEMAKER GENERATOR CHANGE N/A 12/01/2013   Procedure: PERMANENT PACEMAKER GENERATOR CHANGE;  Surgeon: Deboraha Sprang, MD;  Location: [facility] CATH LAB;  Service: Cardiovascular;  Laterality: N/A;  . VAGINAL HYSTERECTOMY  1970    Family History  Problem Relation Age of Onset  . Heart disease Father   . Rheum arthritis  Father   . Allergies Father   . Liver cancer Mother   . Pancreatic cancer Mother     Social History   Social History  . Marital status: Widowed    Spouse name: N/A  . Number of children: Y  . Years of education: N/A   Occupational History  . retired    Social History Main Topics  . Smoking status: Former Smoker    Packs/day: 0.50    Years: 34.00    Types: Cigarettes    Quit date: 08/15/1989  . Smokeless tobacco: Never Used  . Alcohol use No  . Drug use: No  . Sexual activity: No   Other Topics Concern  . Not on file   Social History Narrative   Retired. Widowed. Regularly exercises.      Current Outpatient Prescriptions:  .  acetaminophen (TYLENOL) 500 MG tablet, Take 500 mg by mouth every 6 (six) hours as needed., Disp: , Rfl:  .  ALPRAZolam (XANAX) 0.5 MG tablet, Take 1 tablet (0.5 mg total) by mouth daily as needed., Disp: 90 tablet, Rfl: 0 .  amLODipine (NORVASC) 10 MG tablet, TAKE 1 TABLET (  10 MG TOTAL) BY MOUTH DAILY., Disp: 90 tablet, Rfl: 0 .  amoxicillin-clavulanate (AUGMENTIN) 875-125 MG tablet, Take 1 tablet by mouth 2 (two) times daily., Disp: 20 tablet, Rfl: 0 .  atorvastatin (LIPITOR) 20 MG tablet, TAKE 1 TABLET (20 MG TOTAL) BY MOUTH DAILY., Disp: 90 tablet, Rfl: 0 .  Brinzolamide-Brimonidine (SIMBRINZA) 1-0.2 % SUSP, Place 1 drop into both eyes 2 (two) times daily., Disp: 1 Bottle, Rfl: 1 .  carvedilol (COREG) 12.5 MG tablet, Take 1 tablet (12.5 mg total) by mouth as directed. Take 1 tab in the morning with breakfast, 2 tabs in the evening before bed. (Patient taking differently: Take 12.5 mg by mouth as directed. Take 1 tab in the morning with breakfast, 2 tabs in the evening before bed.), Disp: 45 tablet, Rfl: 6 .  esomeprazole (NEXIUM) 40 MG capsule, TAKE 1 CAPSULE (40 MG TOTAL) BY MOUTH DAILY., Disp: 90 capsule, Rfl: 1 .  flecainide (TAMBOCOR) 50 MG tablet, Take 1 tablet (50 mg total) by mouth 2 (two) times daily., Disp: 60 tablet, Rfl: 6 .  furosemide  (LASIX) 20 MG tablet, TAKE 1 TABLET (20 MG TOTAL) BY MOUTH DAILY., Disp: 90 tablet, Rfl: 0 .  glipiZIDE (GLUCOTROL) 10 MG tablet, Take 1 tablet (10 mg total) by mouth 2 (two) times daily before a meal., Disp: 180 tablet, Rfl: 0 .  metFORMIN (GLUCOPHAGE-XR) 500 MG 24 hr tablet, TAKE 1 TABLET (500 MG TOTAL) BY MOUTH 2 (TWO) TIMES DAILY., Disp: 180 tablet, Rfl: 0 .  mometasone (ELOCON) 0.1 % cream, Apply topically daily. Apply to ear as directed two times a day for 10 days as needed, Disp: 45 g, Rfl: 0 .  Multiple Vitamins-Minerals (CENTRUM SILVER PO), Take by mouth daily., Disp: , Rfl:  .  olmesartan (BENICAR) 40 MG tablet, TAKE 1 TABLET (40 MG TOTAL) BY MOUTH DAILY., Disp: 90 tablet, Rfl: 0 .  TRADJENTA 5 MG TABS tablet, TAKE 1 TABLET (5 MG TOTAL) BY MOUTH DAILY., Disp: 30 tablet, Rfl: 2 .  travoprost, benzalkonium, (TRAVATAN) 0.004 % ophthalmic solution, Place 1 drop into both eyes at bedtime. , Disp: , Rfl:  .  warfarin (COUMADIN) 3 MG tablet, TAKE 1-1.5 TABLETS DAILY AT 6 PM. TAKE 1 TAB ON TUES, THURS, SAT, SUN. TAKE 4.5 MG ON MON, WED, FRI, Disp: 100 tablet, Rfl: 3  Allergies  Allergen Reactions  . Diltiazem Other (See Comments)    Patient stated that she had bad dreams with this medication  . Metoprolol Other (See Comments)    Bad dreams Patient stated that she had suicidal thoughts while taking this medication.  . Levofloxacin In D5w Nausea Only and Nausea And Vomiting     Review of Systems  HENT: Positive for congestion, sinus pressure, sneezing and sore throat.   Respiratory: Positive for cough.     Objective  Vitals:   12/19/16 1149  BP: 128/74  Pulse: 80  Resp: 16  Temp: 98.1 F (36.7 C)  TempSrc: Oral  SpO2: 96%  Weight: 242 lb 9.6 oz (110 kg)  Height: 5\' 5"  (1.651 m)    Physical Exam  Constitutional: She is well-developed, well-nourished, and in no distress.  HENT:  Nose: Right sinus exhibits maxillary sinus tenderness and frontal sinus tenderness. Left sinus  exhibits maxillary sinus tenderness and frontal sinus tenderness.  Mouth/Throat: Oropharynx is clear and moist. No oropharyngeal exudate or posterior oropharyngeal erythema.  Cardiovascular: Normal rate, regular rhythm, S1 normal, S2 normal and normal heart sounds.   No murmur heard. Pulmonary/Chest:  Effort normal and breath sounds normal. No respiratory distress. She has no decreased breath sounds. She has no wheezes. She has no rhonchi.  Nursing note and vitals reviewed.    Assessment & Plan  1. Acute non-recurrent maxillary sinusitis Start on Doxycycline for treatment. - doxycycline (VIBRA-TABS) 100 MG tablet; Take 1 tablet (100 mg total) by mouth 2 (two) times daily.  Dispense: 14 tablet; Refill: 0 - fluticasone (FLONASE) 50 MCG/ACT nasal spray; Place 2 sprays into both nostrils daily.  Dispense: 16 g; Refill: 0  2. Anticoagulation monitoring, INR range 2-3 INR at goal, advised to continue Warfarin and recheck in 3 days. - POCT INR   Ishmail Mcmanamon [facility] 12/19/2016 11:54 AM

## 2016-12-19 NOTE — Patient Instructions (Signed)
Patient Instructions  Patient Details  Name: [name] [name] MRN: [id number] Date of Birth: 07-Sep-1935 Referring Provider:  Minna Merritts, MD  Below are the personal goals you chose as well as exercise and nutrition goals. Our goal is to help you keep on track towards obtaining and maintaining your goals. We will be discussing your progress on these goals with you throughout the program.  Initial Exercise Prescription:     Initial Exercise Prescription - 12/19/16 1200      Date of Initial Exercise RX and Referring Provider   Date 12/19/16   Referring Provider Gollan     Treadmill   MPH 1.5   Grade 0.5   Minutes 15   METs 2.25     Recumbant Bike   Level 1   RPM 60   Minutes 15   METs 2     NuStep   Level 3   Minutes 15   METs 2.25     Biostep-RELP   Level 2   Minutes 15   METs 2     Prescription Details   Frequency (times per week) 3   Duration Progress to 45 minutes of aerobic exercise without signs/symptoms of physical distress     Intensity   THRR 40-80% of Max Heartrate 97-126   Ratings of Perceived Exertion 11-13   Perceived Dyspnea 0-4     Progression   Progression Continue to progress workloads to maintain intensity without signs/symptoms of physical distress.     Resistance Training   Training Prescription Yes   Weight 3   Reps 10-15      Exercise Goals: Frequency: Be able to perform aerobic exercise three times per week working toward 3-5 days per week.  Intensity: Work with a perceived exertion of 11 (fairly light) - 15 (hard) as tolerated. Follow your new exercise prescription and watch for changes in prescription as you progress with the program. Changes will be reviewed with you when they are made.  Duration: You should be able to do 30 minutes of continuous aerobic exercise in addition to a 5 minute warm-up and a 5 minute cool-down routine.  Nutrition Goals: Your personal nutrition goals will be established when you do your nutrition  analysis with the dietician.  The following are nutrition guidelines to follow: Cholesterol < 200mg /day Sodium < 1500mg /day Fiber: Women over 50 yrs - 21 grams per day  Personal Goals:     Personal Goals and Risk Factors at Admission - 12/19/16 1201      Core Components/Risk Factors/Patient Goals on Admission    Weight Management Yes;Weight Loss   Intervention Weight Management: Develop a combined nutrition and exercise program designed to reach desired caloric intake, while maintaining appropriate intake of nutrient and fiber, sodium and fats, and appropriate energy expenditure required for the weight goal.;Weight Management: Provide education and appropriate resources to help participant work on and attain dietary goals.;Weight Management/Obesity: Establish reasonable short term and long term weight goals.;Obesity: Provide education and appropriate resources to help participant work on and attain dietary goals.   Admit Weight 242 lb 12.8 oz (110.1 kg)   Goal Weight: Short Term 237 lb (107.5 kg)   Goal Weight: Long Term 190 lb (86.2 kg)   Expected Outcomes Short Term: Continue to assess and modify interventions until short term weight is achieved;Long Term: Adherence to nutrition and physical activity/exercise program aimed toward attainment of established weight goal;Weight Maintenance: Understanding of the daily nutrition guidelines, which includes 25-35% calories from fat, 7% or  less cal from saturated fats, less than 200mg  cholesterol, less than 1.5gm of sodium, & 5 or more servings of fruits and vegetables daily;Weight Loss: Understanding of general recommendations for a balanced deficit meal plan, which promotes 1-2 lb weight loss per week and includes a negative energy balance of [phone] kcal/d;Understanding recommendations for meals to include 15-35% energy as protein, 25-35% energy from fat, 35-60% energy from carbohydrates, less than 200mg  of dietary cholesterol, 20-35 gm of total fiber  daily;Understanding of distribution of calorie intake throughout the day with the consumption of 4-5 meals/snacks   Sedentary Yes  Participates in Silver Sneakers class at [employer], education, support and counseling about physical activity/exercise needs.;Develop an individualized exercise prescription for aerobic and resistive training based on initial evaluation findings, risk stratification, comorbidities and participant's personal goals.   Expected Outcomes Achievement of increased cardiorespiratory fitness and enhanced flexibility, muscular endurance and strength shown through measurements of functional capacity and personal statement of participant.   Increase Strength and Stamina Yes   Intervention Provide advice, education, support and counseling about physical activity/exercise needs.;Develop an individualized exercise prescription for aerobic and resistive training based on initial evaluation findings, risk stratification, comorbidities and participant's personal goals.   Expected Outcomes Achievement of increased cardiorespiratory fitness and enhanced flexibility, muscular endurance and strength shown through measurements of functional capacity and personal statement of participant.   Improve shortness of breath with ADL's Yes   Intervention Provide education, individualized exercise plan and daily activity instruction to help decrease symptoms of SOB with activities of daily living.   Expected Outcomes Short Term: Achieves a reduction of symptoms when performing activities of daily living.   Develop more efficient breathing techniques such as purse lipped breathing and diaphragmatic breathing; and practicing self-pacing with activity Yes   Intervention Provide education, demonstration and support about specific breathing techniuqes utilized for more efficient breathing. Include techniques such as pursed lipped breathing, diaphragmatic breathing and self-pacing  activity.   Expected Outcomes Short Term: Participant will be able to demonstrate and use breathing techniques as needed throughout daily activities.   Increase knowledge of respiratory medications and ability to use respiratory devices properly  Yes  CPAP at [place] education and demonstration as needed of appropriate use of medications, inhalers, and oxygen therapy.   Expected Outcomes Short Term: Achieves understanding of medications use. Understands that oxygen is a medication prescribed by physician. Demonstrates appropriate use of inhaler and oxygen therapy.   Diabetes Yes   Intervention Provide education about signs/symptoms and action to take for hypo/hyperglycemia.;Provide education about proper nutrition, including hydration, and aerobic/resistive exercise prescription along with prescribed medications to achieve blood glucose in normal ranges: Fasting glucose 65-99 mg/dL   Expected Outcomes Short Term: Participant verbalizes understanding of the signs/symptoms and immediate care of hyper/hypoglycemia, proper foot care and importance of medication, aerobic/resistive exercise and nutrition plan for blood glucose control.;Long Term: Attainment of HbA1C < 7%.   Heart Failure Yes   Intervention Provide a combined exercise and nutrition program that is supplemented with education, support and counseling about heart failure. Directed toward relieving symptoms such as shortness of breath, decreased exercise tolerance, and extremity edema.   Expected Outcomes Improve functional capacity of life;Short term: Attendance in program 2-3 days a week with increased exercise capacity. Reported lower sodium intake. Reported increased fruit and vegetable intake. Reports medication compliance.;Short term: Daily weights obtained and reported for increase. Utilizing diuretic protocols set by physician.;Long term: Adoption of self-care skills and  reduction of barriers for early signs and  symptoms recognition and intervention leading to self-care maintenance.   Hypertension Yes   Intervention Provide education on lifestyle modifcations including regular physical activity/exercise, weight management, moderate sodium restriction and increased consumption of fresh fruit, vegetables, and low fat dairy, alcohol moderation, and smoking cessation.;Monitor prescription use compliance.   Expected Outcomes Short Term: Continued assessment and intervention until BP is < 140/73mm HG in hypertensive participants. < 130/20mm HG in hypertensive participants with diabetes, heart failure or chronic kidney disease.;Long Term: Maintenance of blood pressure at goal levels.   Lipids Yes   Intervention Provide education and support for participant on nutrition & aerobic/resistive exercise along with prescribed medications to achieve LDL 70mg , HDL >40mg .   Expected Outcomes Short Term: Participant states understanding of desired cholesterol values and is compliant with medications prescribed. Participant is following exercise prescription and nutrition guidelines.;Long Term: Cholesterol controlled with medications as prescribed, with individualized exercise RX and with personalized nutrition plan. Value goals: LDL < 70mg , HDL > 40 mg.      Tobacco Use Initial Evaluation: History  Smoking Status   Former Smoker   Packs/day: 0.50   Years: 34.00   Types: Cigarettes   Quit date: 08/15/1989  Smokeless Tobacco   Never Used    Copy of goals given to participant.

## 2016-12-19 NOTE — Progress Notes (Signed)
Pulmonary Individual Treatment Plan  Patient Details  Name: [name] [name] MRN: [id number] Date of Birth: 14-Jun-1935 Referring Provider:   Flowsheet Row Pulmonary Rehab from 12/19/2016 in [facility] Cardiac and Pulmonary Rehab  Referring Provider  Gollan      Initial Encounter Date:  Flowsheet Row Pulmonary Rehab from 12/19/2016 in [facility] Cardiac and Pulmonary Rehab  Date  12/19/16  Referring Provider  Rockey Situ      Visit Diagnosis: Chronic diastolic congestive heart failure ([place])  Patient's Home Medications on Admission:  Current Outpatient Prescriptions:    acetaminophen (TYLENOL) 500 MG tablet, Take 500 mg by mouth every 6 (six) hours as needed., Disp: , Rfl:    ALPRAZolam (XANAX) 0.5 MG tablet, Take 1 tablet (0.5 mg total) by mouth daily as needed., Disp: 90 tablet, Rfl: 0   amLODipine (NORVASC) 10 MG tablet, TAKE 1 TABLET (10 MG TOTAL) BY MOUTH DAILY., Disp: 90 tablet, Rfl: 0   amoxicillin-clavulanate (AUGMENTIN) 875-125 MG tablet, Take 1 tablet by mouth 2 (two) times daily., Disp: 20 tablet, Rfl: 0   atorvastatin (LIPITOR) 20 MG tablet, TAKE 1 TABLET (20 MG TOTAL) BY MOUTH DAILY., Disp: 90 tablet, Rfl: 0   Brinzolamide-Brimonidine (SIMBRINZA) 1-0.2 % SUSP, Place 1 drop into both eyes 2 (two) times daily., Disp: 1 Bottle, Rfl: 1   carvedilol (COREG) 12.5 MG tablet, Take 1 tablet (12.5 mg total) by mouth as directed. Take 1 tab in the morning with breakfast, 2 tabs in the evening before bed. (Patient taking differently: Take 12.5 mg by mouth as directed. Take 1 tab in the morning with breakfast, 2 tabs in the evening before bed.), Disp: 45 tablet, Rfl: 6   doxycycline (VIBRA-TABS) 100 MG tablet, Take 1 tablet (100 mg total) by mouth 2 (two) times daily., Disp: 14 tablet, Rfl: 0   esomeprazole (NEXIUM) 40 MG capsule, TAKE 1 CAPSULE (40 MG TOTAL) BY MOUTH DAILY., Disp: 90 capsule, Rfl: 1   flecainide (TAMBOCOR) 50 MG tablet, Take 1 tablet (50 mg total) by mouth 2 (two) times daily.,  Disp: 60 tablet, Rfl: 6   fluticasone (FLONASE) 50 MCG/ACT nasal spray, Place 2 sprays into both nostrils daily., Disp: 16 g, Rfl: 0   furosemide (LASIX) 20 MG tablet, TAKE 1 TABLET (20 MG TOTAL) BY MOUTH DAILY., Disp: 90 tablet, Rfl: 0   glipiZIDE (GLUCOTROL) 10 MG tablet, Take 1 tablet (10 mg total) by mouth 2 (two) times daily before a meal., Disp: 180 tablet, Rfl: 0   metFORMIN (GLUCOPHAGE-XR) 500 MG 24 hr tablet, TAKE 1 TABLET (500 MG TOTAL) BY MOUTH 2 (TWO) TIMES DAILY., Disp: 180 tablet, Rfl: 0   mometasone (ELOCON) 0.1 % cream, Apply topically daily. Apply to ear as directed two times a day for 10 days as needed, Disp: 45 g, Rfl: 0   Multiple Vitamins-Minerals (CENTRUM SILVER PO), Take by mouth daily., Disp: , Rfl:    olmesartan (BENICAR) 40 MG tablet, TAKE 1 TABLET (40 MG TOTAL) BY MOUTH DAILY., Disp: 90 tablet, Rfl: 0   TRADJENTA 5 MG TABS tablet, TAKE 1 TABLET (5 MG TOTAL) BY MOUTH DAILY., Disp: 30 tablet, Rfl: 2   travoprost, benzalkonium, (TRAVATAN) 0.004 % ophthalmic solution, Place 1 drop into both eyes at bedtime. , Disp: , Rfl:    warfarin (COUMADIN) 3 MG tablet, TAKE 1-1.5 TABLETS DAILY AT 6 PM. TAKE 1 TAB ON TUES, THURS, SAT, SUN. TAKE 4.5 MG ON MON, WED, FRI, Disp: 100 tablet, Rfl: 3  Past Medical History: Past Medical History:  Diagnosis  Date   1st degree AV block    a. PR 400 msec with Apacing   Anemia    Anxiety    Arthritis    a. shoulders.   Chest pain    a. 2004 Cath:  reportedly nl;  b. 09/2013 Neg MV.   Chronic diastolic heart failure ([place])    a. 09/2013 Echo: EF 50-55%, mild LVH, mild to mod MR/TR.   Esophageal reflux    H/O hiatal hernia    History of UTI    Hyperlipidemia    Morbid obesity ([place])    OSA on CPAP    PAF (paroxysmal atrial fibrillation) (HCC)    SSS (sick sinus syndrome) ([place])    a. 2008 s/p MDT PPM;  b. 11/2013 Gen change- MDT ADDRL1 [place] DC PPM, ser # [id number].   Symptomatic PVCs    Syncope and collapse     a. Felt to be vasovagal.   Type II diabetes mellitus (HCC)    Unspecified essential hypertension    Unspecified glaucoma(365.9)     Tobacco Use: History  Smoking Status   Former Smoker   Packs/day: 0.50   Years: 34.00   Types: Cigarettes   Quit date: 08/15/1989  Smokeless Tobacco   Never Used    Labs: Recent Review Flowsheet Data    Labs for ITP Cardiac and Pulmonary Rehab Latest Ref Rng & Units 09/16/2015 12/16/2015 03/15/2016 07/28/2016 11/01/2016   Cholestrol <200 mg/dL 146 153 144 - 156   LDLCALC <100 mg/dL 78 72 67 - 79   HDL >50 mg/dL 49 61 49 - 58   Trlycerides <150 mg/dL 97 100 138 - 93   Hemoglobin A1c - - 7.8 8.2 6.8 6.8       ADL UCSD:     Pulmonary Assessment Scores    Row Name 12/19/16 1158         ADL UCSD   ADL Phase Entry     SOB Score total 21     Rest 1     Walk 0     Stairs 2     Bath 0     Dress 0     Shop 0        Pulmonary Function Assessment:     Pulmonary Function Assessment - 12/19/16 1157      Initial Spirometry Results   FVC% 85 %   FEV1% 101 %   FEV1/FVC Ratio 91.51     Breath   Bilateral Breath Sounds Clear   Shortness of Breath Yes      Exercise Target Goals: Date: 12/19/16  Exercise Program Goal: Individual exercise prescription set with THRR, safety & activity barriers. Participant demonstrates ability to understand and report RPE using BORG scale, to self-measure pulse accurately, and to acknowledge the importance of the exercise prescription.  Exercise Prescription Goal: Starting with aerobic activity 30 plus minutes a day, 3 days per week for initial exercise prescription. Provide home exercise prescription and guidelines that participant acknowledges understanding prior to discharge.  Activity Barriers & Risk Stratification:     Activity Barriers & Cardiac Risk Stratification - 12/19/16 1157      Activity Barriers & Cardiac Risk Stratification   Activity Barriers Arthritis;Back Problems;Joint  Problems;Shortness of Breath;Deconditioning;Muscular Weakness      6 Minute Walk:     6 Minute Walk    Row Name 12/19/16 1201         6 Minute Walk   Distance 965 feet     Walk  Time 6 minutes     # of Rest Breaks 0     MPH 1.82     METS 2.38     RPE 15     Perceived Dyspnea  2     VO2 Peak 5.43     Symptoms No     Resting HR 68 bpm     Resting BP 122/66     Max Ex. HR 113 bpm     Max Ex. BP 172/72       Interval HR   Baseline HR 68     1 Minute HR 110     2 Minute HR 104     3 Minute HR 102     5 Minute HR 98     6 Minute HR 113     Interval Heart Rate? Yes       Interval Oxygen   Interval Oxygen? Yes     Baseline Oxygen Saturation % 97 %     1 Minute Oxygen Saturation % 96 %     2 Minute Oxygen Saturation % 94 %     3 Minute Oxygen Saturation % 95 %     5 Minute Oxygen Saturation % 96 %     6 Minute Oxygen Saturation % 95 %        Initial Exercise Prescription:     Initial Exercise Prescription - 12/19/16 1200      Date of Initial Exercise RX and Referring Provider   Date 12/19/16   Referring Provider Gollan     Treadmill   MPH 1.5   Grade 0.5   Minutes 15   METs 2.25     Recumbant Bike   Level 1   RPM 60   Minutes 15   METs 2     NuStep   Level 3   Minutes 15   METs 2.25     Biostep-RELP   Level 2   Minutes 15   METs 2     Prescription Details   Frequency (times per week) 3   Duration Progress to 45 minutes of aerobic exercise without signs/symptoms of physical distress     Intensity   THRR 40-80% of Max Heartrate 97-126   Ratings of Perceived Exertion 11-13   Perceived Dyspnea 0-4     Progression   Progression Continue to progress workloads to maintain intensity without signs/symptoms of physical distress.     Resistance Training   Training Prescription Yes   Weight 3   Reps 10-15      Perform Capillary Blood Glucose checks as needed.  Exercise Prescription Changes:   Exercise Comments:   Discharge Exercise  Prescription (Final Exercise Prescription Changes):    Nutrition:  Target Goals: Understanding of nutrition guidelines, daily intake of sodium 1500mg , cholesterol 200mg , calories 30% from fat and 7% or less from saturated fats, daily to have 5 or more servings of fruits and vegetables.  Biometrics:     Pre Biometrics - 12/19/16 1159      Pre Biometrics   Height 5\' 5"  (1.651 m)   Weight 242 lb 12.8 oz (110.1 kg)   Waist Circumference 47 inches   Hip Circumference 51.5 inches   Waist to Hip Ratio 0.91 %   BMI (Calculated) 40.5       Nutrition Therapy Plan and Nutrition Goals:   Nutrition Discharge: Rate Your Plate Scores:   Psychosocial: Target Goals: Acknowledge presence or absence of depression, maximize coping skills, provide positive support system.  Participant is able to verbalize types and ability to use techniques and skills needed for reducing stress and depression.  Initial Review & Psychosocial Screening:     Initial Psych Review & Screening - 12/19/16 1205      Family Dynamics   Good Support System? Yes   Comments Ms Arca has good support from her family. Her 2 sisters participate in the [place] classes at the [facility] together. She likes being active by visiting sick friends and shopping. She is looking forward to [employer].     Barriers   Psychosocial barriers to participate in program There are no identifiable barriers or psychosocial needs.;The patient should benefit from training in stress management and relaxation.     Screening Interventions   Interventions Encouraged to exercise;Program counselor consult      Quality of Life Scores:     Quality of Life - 12/19/16 1208      Quality of Life Scores   Health/Function Pre 21 %   Socioeconomic Pre 21 %   Psych/Spiritual Pre 20.86 %   Family Pre 20.4 %   GLOBAL Pre 20.71 %      PHQ-9: Recent Review Flowsheet Data    Depression screen [facility] 2/9 12/19/2016 12/19/2016 09/18/2016 03/23/2016  03/15/2016   Decreased Interest 2 0 0 0 0   Down, Depressed, Hopeless 0 0 0 0 0   PHQ - 2 Score 2 0 0 0 0   Altered sleeping 3 - - - -   Tired, decreased energy 3 - - - -   Change in appetite 0 - - - -   Feeling bad or failure about yourself  0 - - - -   Trouble concentrating 0 - - - -   Moving slowly or fidgety/restless 0 - - - -   Suicidal thoughts 0 - - - -   PHQ-9 Score 8 - - - -   Difficult doing work/chores Very difficult - - - -      Psychosocial Evaluation and Intervention:   Psychosocial Re-Evaluation:  Education: Education Goals: Education classes will be provided on a weekly basis, covering required topics. Participant will state understanding/return demonstration of topics presented.  Learning Barriers/Preferences:     Learning Barriers/Preferences - 12/19/16 1157      Learning Barriers/Preferences   Learning Barriers None   Learning Preferences None      Education Topics: Initial Evaluation Education: - Verbal, written and demonstration of respiratory meds, RPE/PD scales, oximetry and breathing techniques. Instruction on use of nebulizers and MDIs: cleaning and proper use, rinsing mouth with steroid doses and importance of monitoring MDI activations. Flowsheet Row Pulmonary Rehab from 12/19/2016 in [facility] Cardiac and Pulmonary Rehab  Date  12/19/16  Educator  LB  Instruction Review Code  2- meets goals/outcomes      General Nutrition Guidelines/Fats and Fiber: -Group instruction provided by verbal, written material, models and posters to present the general guidelines for heart healthy nutrition. Gives an explanation and review of dietary fats and fiber.   Controlling Sodium/Reading Food Labels: -Group verbal and written material supporting the discussion of sodium use in heart healthy nutrition. Review and explanation with models, verbal and written materials for utilization of the food label.   Exercise Physiology & Risk Factors: - Group verbal and  written instruction with models to review the exercise physiology of the cardiovascular system and associated critical values. Details cardiovascular disease risk factors and the goals associated with each risk factor.   Aerobic Exercise &  Resistance Training: - Gives group verbal and written discussion on the health impact of inactivity. On the components of aerobic and resistive training programs and the benefits of this training and how to safely progress through these programs.   Flexibility, Balance, General Exercise Guidelines: - Provides group verbal and written instruction on the benefits of flexibility and balance training programs. Provides general exercise guidelines with specific guidelines to those with heart or lung disease. Demonstration and skill practice provided.   Stress Management: - Provides group verbal and written instruction about the health risks of elevated stress, cause of high stress, and healthy ways to reduce stress.   Depression: - Provides group verbal and written instruction on the correlation between heart/lung disease and depressed mood, treatment options, and the stigmas associated with seeking treatment.   Exercise & Equipment Safety: - Individual verbal instruction and demonstration of equipment use and safety with use of the equipment.   Infection Prevention: - Provides verbal and written material to individual with discussion of infection control including proper hand washing and proper equipment cleaning during exercise session. Flowsheet Row Pulmonary Rehab from 12/19/2016 in [facility] Cardiac and Pulmonary Rehab  Date  12/19/16  Educator  LB  Instruction Review Code  2- meets goals/outcomes      Falls Prevention: - Provides verbal and written material to individual with discussion of falls prevention and safety. Flowsheet Row Pulmonary Rehab from 12/19/2016 in [facility] Cardiac and Pulmonary Rehab  Date  12/19/16  Educator  LB  Instruction Review  Code  2- meets goals/outcomes      Diabetes: - Individual verbal and written instruction to review signs/symptoms of diabetes, desired ranges of glucose level fasting, after meals and with exercise. Advice that pre and post exercise glucose checks will be done for 3 sessions at entry of program. Flowsheet Row Pulmonary Rehab from 12/19/2016 in [facility] Cardiac and Pulmonary Rehab  Date  12/19/16  Educator  LB  Instruction Review Code  2- meets goals/outcomes      Chronic Lung Diseases: - Group verbal and written instruction to review new updates, new respiratory medications, new advancements in procedures and treatments. Provide informative websites and "800" numbers of self-education.   Lung Procedures: - Group verbal and written instruction to describe testing methods done to diagnose lung disease. Review the outcome of test results. Describe the treatment choices: Pulmonary Function Tests, ABGs and oximetry.   Energy Conservation: - Provide group verbal and written instruction for methods to conserve energy, plan and organize activities. Instruct on pacing techniques, use of adaptive equipment and posture/positioning to relieve shortness of breath.   Triggers: - Group verbal and written instruction to review types of environmental controls: home humidity, furnaces, filters, dust mite/pet prevention, HEPA vacuums. To discuss weather changes, air quality and the benefits of nasal washing.   Exacerbations: - Group verbal and written instruction to provide: warning signs, infection symptoms, calling MD promptly, preventive modes, and value of vaccinations. Review: effective airway clearance, coughing and/or vibration techniques. Create an Sports administrator.   Oxygen: - Individual and group verbal and written instruction on oxygen therapy. Includes supplement oxygen, available portable oxygen systems, continuous and intermittent flow rates, oxygen safety, concentrators, and Medicare  reimbursement for oxygen.   Respiratory Medications: - Group verbal and written instruction to review medications for lung disease. Drug class, frequency, complications, importance of spacers, rinsing mouth after steroid MDI's, and proper cleaning methods for nebulizers.   AED/CPR: - Group verbal and written instruction with the use of models to  demonstrate the basic use of the AED with the basic ABC's of resuscitation.   Breathing Retraining: - Provides individuals verbal and written instruction on purpose, frequency, and proper technique of diaphragmatic breathing and pursed-lipped breathing. Applies individual practice skills. Flowsheet Row Pulmonary Rehab from 12/19/2016 in [facility] Cardiac and Pulmonary Rehab  Date  12/19/16  Educator  LB  Instruction Review Code  2- meets goals/outcomes      Anatomy and Physiology of the Lungs: - Group verbal and written instruction with the use of models to provide basic lung anatomy and physiology related to function, structure and complications of lung disease.   Heart Failure: - Group verbal and written instruction on the basics of heart failure: signs/symptoms, treatments, explanation of ejection fraction, enlarged heart and cardiomyopathy.   Sleep Apnea: - Individual verbal and written instruction to review Obstructive Sleep Apnea. Review of risk factors, methods for diagnosing and types of masks and machines for OSA.   Anxiety: - Provides group, verbal and written instruction on the correlation between heart/lung disease and anxiety, treatment options, and management of anxiety.   Relaxation: - Provides group, verbal and written instruction about the benefits of relaxation for patients with heart/lung disease. Also provides patients with examples of relaxation techniques.   Knowledge Questionnaire Score:     Knowledge Questionnaire Score - 12/19/16 1157      Knowledge Questionnaire Score   Pre Score 7/10       Core  Components/Risk Factors/Patient Goals at Admission:     Personal Goals and Risk Factors at Admission - 12/19/16 1201      Core Components/Risk Factors/Patient Goals on Admission    Weight Management Yes;Weight Loss   Intervention Weight Management: Develop a combined nutrition and exercise program designed to reach desired caloric intake, while maintaining appropriate intake of nutrient and fiber, sodium and fats, and appropriate energy expenditure required for the weight goal.;Weight Management: Provide education and appropriate resources to help participant work on and attain dietary goals.;Weight Management/Obesity: Establish reasonable short term and long term weight goals.;Obesity: Provide education and appropriate resources to help participant work on and attain dietary goals.   Admit Weight 242 lb 12.8 oz (110.1 kg)   Goal Weight: Short Term 237 lb (107.5 kg)   Goal Weight: Long Term 190 lb (86.2 kg)   Expected Outcomes Short Term: Continue to assess and modify interventions until short term weight is achieved;Long Term: Adherence to nutrition and physical activity/exercise program aimed toward attainment of established weight goal;Weight Maintenance: Understanding of the daily nutrition guidelines, which includes 25-35% calories from fat, 7% or less cal from saturated fats, less than 200mg  cholesterol, less than 1.5gm of sodium, & 5 or more servings of fruits and vegetables daily;Weight Loss: Understanding of general recommendations for a balanced deficit meal plan, which promotes 1-2 lb weight loss per week and includes a negative energy balance of [phone] kcal/d;Understanding recommendations for meals to include 15-35% energy as protein, 25-35% energy from fat, 35-60% energy from carbohydrates, less than 200mg  of dietary cholesterol, 20-35 gm of total fiber daily;Understanding of distribution of calorie intake throughout the day with the consumption of 4-5 meals/snacks   Sedentary Yes   Participates in Silver [employer]neakers class at [employer], education, support and counseling about physical activity/exercise needs.;Develop an individualized exercise prescription for aerobic and resistive training based on initial evaluation findings, risk stratification, comorbidities and participant's personal goals.   Expected Outcomes Achievement of increased cardiorespiratory fitness and enhanced flexibility, muscular endurance and strength  shown through measurements of functional capacity and personal statement of participant.   Increase Strength and Stamina Yes   Intervention Provide advice, education, support and counseling about physical activity/exercise needs.;Develop an individualized exercise prescription for aerobic and resistive training based on initial evaluation findings, risk stratification, comorbidities and participant's personal goals.   Expected Outcomes Achievement of increased cardiorespiratory fitness and enhanced flexibility, muscular endurance and strength shown through measurements of functional capacity and personal statement of participant.   Improve shortness of breath with ADL's Yes   Intervention Provide education, individualized exercise plan and daily activity instruction to help decrease symptoms of SOB with activities of daily living.   Expected Outcomes Short Term: Achieves a reduction of symptoms when performing activities of daily living.   Develop more efficient breathing techniques such as purse lipped breathing and diaphragmatic breathing; and practicing self-pacing with activity Yes   Intervention Provide education, demonstration and support about specific breathing techniuqes utilized for more efficient breathing. Include techniques such as pursed lipped breathing, diaphragmatic breathing and self-pacing activity.   Expected Outcomes Short Term: Participant will be able to demonstrate and use breathing techniques as needed throughout  daily activities.   Increase knowledge of respiratory medications and ability to use respiratory devices properly  Yes  CPAP at [place] education and demonstration as needed of appropriate use of medications, inhalers, and oxygen therapy.   Expected Outcomes Short Term: Achieves understanding of medications use. Understands that oxygen is a medication prescribed by physician. Demonstrates appropriate use of inhaler and oxygen therapy.   Diabetes Yes   Intervention Provide education about signs/symptoms and action to take for hypo/hyperglycemia.;Provide education about proper nutrition, including hydration, and aerobic/resistive exercise prescription along with prescribed medications to achieve blood glucose in normal ranges: Fasting glucose 65-99 mg/dL   Expected Outcomes Short Term: Participant verbalizes understanding of the signs/symptoms and immediate care of hyper/hypoglycemia, proper foot care and importance of medication, aerobic/resistive exercise and nutrition plan for blood glucose control.;Long Term: Attainment of HbA1C < 7%.   Heart Failure Yes   Intervention Provide a combined exercise and nutrition program that is supplemented with education, support and counseling about heart failure. Directed toward relieving symptoms such as shortness of breath, decreased exercise tolerance, and extremity edema.   Expected Outcomes Improve functional capacity of life;Short term: Attendance in program 2-3 days a week with increased exercise capacity. Reported lower sodium intake. Reported increased fruit and vegetable intake. Reports medication compliance.;Short term: Daily weights obtained and reported for increase. Utilizing diuretic protocols set by physician.;Long term: Adoption of self-care skills and reduction of barriers for early signs and symptoms recognition and intervention leading to self-care maintenance.   Hypertension Yes   Intervention Provide education on lifestyle  modifcations including regular physical activity/exercise, weight management, moderate sodium restriction and increased consumption of fresh fruit, vegetables, and low fat dairy, alcohol moderation, and smoking cessation.;Monitor prescription use compliance.   Expected Outcomes Short Term: Continued assessment and intervention until BP is < 140/26mm HG in hypertensive participants. < 130/52mm HG in hypertensive participants with diabetes, heart failure or chronic kidney disease.;Long Term: Maintenance of blood pressure at goal levels.   Lipids Yes   Intervention Provide education and support for participant on nutrition & aerobic/resistive exercise along with prescribed medications to achieve LDL 70mg , HDL >40mg .   Expected Outcomes Short Term: Participant states understanding of desired cholesterol values and is compliant with medications prescribed. Participant is following exercise prescription and nutrition guidelines.;Long Term: Cholesterol controlled with medications as  prescribed, with individualized exercise RX and with personalized nutrition plan. Value goals: LDL < 70mg , HDL > 40 mg.      Core Components/Risk Factors/Patient Goals Review:    Core Components/Risk Factors/Patient Goals at Discharge (Final Review):    ITP Comments:   Comments: Ms Rasmus plans to start [facility] on 12/27/16 and attend 3 days/week.

## 2016-12-25 ENCOUNTER — Ambulatory Visit (INDEPENDENT_AMBULATORY_CARE_PROVIDER_SITE_OTHER): Payer: Medicare Other | Admitting: Cardiovascular Disease

## 2016-12-25 ENCOUNTER — Encounter: Payer: Self-pay | Admitting: Cardiovascular Disease

## 2016-12-25 VITALS — BP 122/68 | HR 81 | Ht 65.0 in | Wt 243.8 lb

## 2016-12-25 DIAGNOSIS — I480 Paroxysmal atrial fibrillation: Secondary | ICD-10-CM | POA: Diagnosis not present

## 2016-12-25 DIAGNOSIS — Z950 Presence of cardiac pacemaker: Secondary | ICD-10-CM

## 2016-12-25 DIAGNOSIS — R002 Palpitations: Secondary | ICD-10-CM

## 2016-12-25 DIAGNOSIS — E119 Type 2 diabetes mellitus without complications: Secondary | ICD-10-CM

## 2016-12-25 DIAGNOSIS — I5033 Acute on chronic diastolic (congestive) heart failure: Secondary | ICD-10-CM

## 2016-12-25 DIAGNOSIS — I493 Ventricular premature depolarization: Secondary | ICD-10-CM | POA: Diagnosis not present

## 2016-12-25 DIAGNOSIS — E785 Hyperlipidemia, unspecified: Secondary | ICD-10-CM

## 2016-12-25 DIAGNOSIS — R0602 Shortness of breath: Secondary | ICD-10-CM

## 2016-12-25 NOTE — Progress Notes (Signed)
Cardiology Office Note  Date:  12/25/2016   ID:  [name] [name], DOB [birth year], MRN [id number]  PCP:  Keith Rake, MD   Chief Complaint  Patient presents with  . other    6 month follow up. Meds reviewed by the pt. verbally. Pt. c/o having a sinus infection/runny nose, would like to know what to take with her heart meds. Pt. can't tolerate the coreg 12.5 mg, the 2 tabs in the evening, causes chest pressure; she take 1 and 1/2 at bedtime instead.     HPI:  [name] [name] is a 82 yo woman with a history of atrial fibrillation, obesity, symptomatic palpitations/PVCs, status post pacemaker implantation for sick sinus syndrome/tachybradycardia syndrome with chronic symptoms of palpitations, shortness of breath with exertion, who presents for routine followup of her atrial fibrillation and shortness of breath History of chronic fatigue  In follow-up today she reports that she is still getting over a Sinus infection, finished amox Now on doxycycline. Still no improvement in symptoms "funny smell up her nose"  Seen by pulmonary, increased CPAP pressures up to 10  Taking flecainide, 50 mg twice a day, Improved sx of palpitations,  Some SOB on exertion, stable. Still with episodes of palpitations causing lightheadedness, flushing Though overall much improved  Trouble with coreg in the evening, 2 pills gives her side effects,  fullness in her throat Takes 1 Coreg in the morning , 1.5 pills in the evening   Carotid u/s <39% b/l Thyroid ABN  Pounding in ear in 11/2016, BP was running high 140s to 160 Now only in the right ear  EKG on today's visit shows paced rhythm, rate 82 bpm  previous pacer download reviewed with her showing frequent PVCs, paroxysmal tachycardia sometimes lasting up to 3.5 hours  Other past medical history Episodes of atrial fibrillation Seen by Dr. Lovena Le 07/2016, No med changes (as not having sx at that time)  Martin Majestic to hospital 11/09/16, SOB, tingling, called 911   anemia, HCT 29 Had a CT scan, Possible anxiety Got hot  compliant with her CPAP.  Previous 30 day monitor concerning for episodes of atrial fibrillation  Hemoglobin A1c previously in the 8 range No regular exercise program Tolerating warfarin with no complications  Recent Pacer changes 10/15, felt better Takes bystolic 20 BID Wears compressions on a regular basis  Previously on HCTZ, had abd cramps, ABD pains (now stopped) Finger fungus, on lamacil daily  June 2013 total cholesterol 155, LDL 82, HDL 56  Previous hospital admission on 09/24/2013 for acute on chronic diastolic CHF. She was drinking a significant amount of fluids. She was treated with IV diuretics with improvement of her symptoms. Was recommended that she go home with Lasix every other day. She had a stress test while in the hospital showing no ischemia. Echocardiogram dated 09/24/2013 showing ejection fraction 50-55%,, mild to moderate tricuspid regurg, mild to moderate MR, normal RV function, normal right ventricular systolic pressure  ER again on 10/18/2013.  Previous hx of shortness of breath and palpitations. May 2012, she had adjustments to her pacemaker and since that time has felt back to normal. she has had long hx of hot flashes, and a racing heart rate when she is in bed. Recently this has been well-controlled on high-dose bystolic.  chronic back and hip pain. She did receive a cortisone shot on the left with no significant improvement. She reports that her sugars have been well-controlled, blood pressure well controlled on current medications  syncope episode August 2013 .  in her bedroom folding clothes and the next thing she knew she found herself on the floor in the bathroom. She spent 2 days in the hospital, then at least 10 days in rehabilitation for significant left arm bruising and she was on warfarin. Pacemaker was interrogated after the fact showing 85% atrial paced rhythm, no other  arrhythmia. Etiology concerning for vasovagal episode, though exact cause is uncertain.  She has chronic buzzing in her ear. She does report this is a chronic issue going back many years. Prior workup with ear nose throat. She does have prior exposure to loud noises, worked in a [employer].  cardiac catheterization in 2004, stress testing,  echo showed normal systolic function, mild LVH, diastolic relaxation abnormality, mildly elevated right ventricular systolic pressures, mild MR. treadmill study showing normal blood pressure response to exercise, chronotropic incompetence with peak heart rate of 100 beats per minute, resting heart rate in the 70s. Previous PFTs demonstrated extrinsic compression/restrictive physiology.  PMH:   has a past medical history of 1st degree AV block; Anemia; Anxiety; Arthritis; Chest pain; Chronic diastolic heart failure ([place]); Esophageal reflux; H/O hiatal hernia; History of UTI; Hyperlipidemia; Morbid obesity ([place]); OSA on CPAP; PAF (paroxysmal atrial fibrillation) ([place]); SSS (sick sinus syndrome) ([place]); Symptomatic PVCs; Syncope and collapse; Type II diabetes mellitus ([place]); Unspecified essential hypertension; and Unspecified glaucoma(365.9).  PSH:    Past Surgical History:  Procedure Laterality Date  . DILATION AND CURETTAGE OF UTERUS    . INCISION AND DRAINAGE OF WOUND Right 03/1998   "leg; it was like a boil" (12/01/2013)  . INSERT / REPLACE / REMOVE PACEMAKER  2008; 12/01/2013   MDT ADDRL1 pacemaker - gen change by Dr Caryl Comes 12/01/2013  . LUNG BIOPSY  2010   [facility]  . PERMANENT PACEMAKER GENERATOR CHANGE N/A 12/01/2013   Procedure: PERMANENT PACEMAKER GENERATOR CHANGE;  Surgeon: Deboraha Sprang, MD;  Location: [facility] CATH LAB;  Service: Cardiovascular;  Laterality: N/A;  . VAGINAL HYSTERECTOMY  1970    Current Outpatient Prescriptions  Medication Sig Dispense Refill  . acetaminophen (TYLENOL) 500 MG tablet Take 500 mg by mouth every 6 (six) hours as  needed.    . ALPRAZolam (XANAX) 0.5 MG tablet Take 1 tablet (0.5 mg total) by mouth daily as needed. 90 tablet 0  . amLODipine (NORVASC) 10 MG tablet TAKE 1 TABLET (10 MG TOTAL) BY MOUTH DAILY. 90 tablet 0  . atorvastatin (LIPITOR) 20 MG tablet TAKE 1 TABLET (20 MG TOTAL) BY MOUTH DAILY. 90 tablet 0  . Brinzolamide-Brimonidine (SIMBRINZA) 1-0.2 % SUSP Place 1 drop into both eyes 2 (two) times daily. 1 Bottle 1  . carvedilol (COREG) 12.5 MG tablet Take 1 tablet (12.5 mg) in the am and 1 and 1/2 tablet in the evening.    Marland Kitchen doxycycline (VIBRA-TABS) 100 MG tablet Take 1 tablet (100 mg total) by mouth 2 (two) times daily. 14 tablet 0  . esomeprazole (NEXIUM) 40 MG capsule TAKE 1 CAPSULE (40 MG TOTAL) BY MOUTH DAILY. 90 capsule 1  . flecainide (TAMBOCOR) 50 MG tablet Take 1 tablet (50 mg total) by mouth 2 (two) times daily. 60 tablet 6  . fluticasone (FLONASE) 50 MCG/ACT nasal spray Place 2 sprays into both nostrils daily. 16 g 0  . furosemide (LASIX) 20 MG tablet TAKE 1 TABLET (20 MG TOTAL) BY MOUTH DAILY. 90 tablet 0  . glipiZIDE (GLUCOTROL) 10 MG tablet Take 1 tablet (10 mg total) by mouth 2 (two) times daily before a meal. 180 tablet  0  . metFORMIN (GLUCOPHAGE-XR) 500 MG 24 hr tablet TAKE 1 TABLET (500 MG TOTAL) BY MOUTH 2 (TWO) TIMES DAILY. 180 tablet 0  . mometasone (ELOCON) 0.1 % cream Apply topically daily. Apply to ear as directed two times a day for 10 days as needed 45 g 0  . Multiple Vitamins-Minerals (CENTRUM SILVER PO) Take by mouth daily.    Marland Kitchen olmesartan (BENICAR) 40 MG tablet TAKE 1 TABLET (40 MG TOTAL) BY MOUTH DAILY. 90 tablet 0  . TRADJENTA 5 MG TABS tablet TAKE 1 TABLET (5 MG TOTAL) BY MOUTH DAILY. 30 tablet 2  . travoprost, benzalkonium, (TRAVATAN) 0.004 % ophthalmic solution Place 1 drop into both eyes at bedtime.     Marland Kitchen warfarin (COUMADIN) 3 MG tablet TAKE 1-1.5 TABLETS DAILY AT 6 PM. TAKE 1 TAB ON TUES, THURS, SAT, SUN. TAKE 4.5 MG ON MON, WED, FRI 100 tablet 3   No current  facility-administered medications for this visit.      Allergies:   Diltiazem; Metoprolol; and Levofloxacin in d5w   Social History:  The patient  reports that she quit smoking about 27 years ago. Her smoking use included Cigarettes. She has a 17.00 pack-year smoking history. She has never used smokeless tobacco. She reports that she does not drink alcohol or use drugs.   Family History:   family history includes Allergies in her father; Heart disease in her father; Liver cancer in her mother; Pancreatic cancer in her mother; Rheum arthritis in her father.    Review of Systems: Review of Systems  Constitutional: Negative.   HENT: Positive for congestion and sinus pain.   Respiratory: Negative.   Cardiovascular: Negative.   Gastrointestinal: Negative.   Musculoskeletal: Negative.   Neurological: Negative.   Psychiatric/Behavioral: Negative.   All other systems reviewed and are negative.    PHYSICAL EXAM: VS:  BP 122/68 (BP Location: Left Arm, Patient Position: Sitting, Cuff Size: Large)   Pulse 81   Ht 5\' 5"  (1.651 m)   Wt 243 lb 12 oz (110.6 kg)   BMI 40.56 kg/m  , BMI Body mass index is 40.56 kg/m. GEN: Well nourished, well developed, in no acute distress, obese HEENT: normal  Neck: no JVD, carotid bruits, or masses Cardiac: RRR; no murmurs, rubs, or gallops,no edema  Respiratory:  clear to auscultation bilaterally, normal work of breathing GI: soft, nontender, nondistended, + BS MS: no deformity or atrophy  Skin: warm and dry, no rash Neuro:  Strength and sensation are intact Psych: euthymic mood, full affect    Recent Labs: 11/01/2016: ALT 14 11/09/2016: BUN 18; Creatinine, Ser 1.05; Hemoglobin 9.8; Platelets 208; Potassium 3.4; Sodium 142    Lipid Panel Lab Results  Component Value Date   CHOL 156 11/01/2016   HDL 58 11/01/2016   LDLCALC 79 11/01/2016   TRIG 93 11/01/2016      Wt Readings from Last 3 Encounters:  12/25/16 243 lb 12 oz (110.6 kg)   12/19/16 242 lb 9.6 oz (110 kg)  12/19/16 242 lb 12.8 oz (110.1 kg)       ASSESSMENT AND PLAN:  Palpitations - Plan: EKG 12-Lead Still having sx episodes, better with flecainid Acute onset, pounding, head going to pop, quick, dizzy Follow up with Dr. Lovena Le  PVC (premature ventricular contraction) - Plan: EKG 12-Lead Improved on flecainide,  Paroxysmal atrial fibrillation ([place]) - Plan: EKG 12-Lead Currently on flecainide We'll wait for next pacer download, may need to increase the dose as she continues to have  episodes of palpitations though somewhat improved  Hyperlipidemia, unspecified hyperlipidemia type Cholesterol is at goal on the current lipid regimen. No changes to the medications were made.  Acute on chronic diastolic CHF (congestive heart failure) ([place]) Euvolemic, on today's visit  Controlled type 2 diabetes mellitus without complication, without long-term current use of insulin ([place]) We have encouraged continued exercise, careful diet management in an effort to lose weight.  Cardiac pacemaker - Medtronic Followed by EP in [facility] Stable, chronic Recommended weight loss, exercise program   Total encounter time more than 25 minutes  Greater than 50% was spent in counseling and coordination of care with the patient   Disposition:   F/U  6 months   Orders Placed This Encounter  Procedures  . EKG 12-Lead     Signed, Esmond Plants, M.D., Ph.D. 12/25/2016  [place], [place]

## 2016-12-25 NOTE — Patient Instructions (Addendum)
Phone number for ENT in [facility]:   [facility] [place] [address] [place], [place] [address] Phone [phone]   Medication Instructions:   No medication changes made  Labwork:  No new labs needed  Testing/Procedures:  We will order a thyroid u/s for nodules 2 cm Friday, January 26 @ 1:30 Please arrive @ the [place] entrance of [facility] @ 1:15  I recommend watching educational videos on topics of interest to you at:       [email or web address]  Enter code: HEARTCARE    Follow-Up: It was a pleasure seeing you in the office today. Please call us if you have new issues that need to be addressed before your next appt.  [phone]  Your physician wants you to follow-up in: 6 months.  You will receive a reminder letter in the mail two months in advance. If you don't receive a letter, please call our office to schedule the follow-up appointment.  If you need a refill on your cardiac medications before your next appointment, please call your pharmacy.     Thyroid Nodule A thyroid nodule is an isolatedgrowth of thyroid cells that forms a lump in your thyroid gland. The thyroid gland is a butterfly-shaped gland. It is found in the lower front of your neck. This gland sends chemical messengers (hormones) through your blood to all parts of your body. These hormones are important in regulating your body temperature and helping your body to use energy. Thyroid nodules are common. Most are not cancerous (are benign). You may have one nodule or several nodules. Different types of thyroid nodules include:  Nodules that grow and fill with fluid (thyroid cysts).  Nodules that produce too much thyroid hormone (hot nodules or hyperthyroid).  Nodules that produce no thyroid hormone (cold nodules or hypothyroid).  Nodules that form from cancer cells (thyroid cancers). What are the causes? Usually, the cause of this condition is not known. What  increases the risk? Factors that make this condition more likely to develop include:  Increasing age. Thyroid nodules become more common in people who are older than 80 years of age.  Gender.  Benign thyroid nodules are more common in women.  Cancerous (malignant) thyroid nodules are more common in men.  A family history that includes:  Thyroid nodules.  Pheochromocytoma.  Thyroid carcinoma.  Hyperparathyroidism.  Certain kinds of thyroid diseases, such as Hashimoto thyroiditis.  Lack of iodine.  A history of head and neck radiation, such as from X-rays. What are the signs or symptoms? It is common for this condition to cause no symptoms. If you have symptoms, they may include:  A lump in your lower neck.  Feeling a lump or tickle in your throat.  Pain in your neck, jaw, or ear.  Having trouble swallowing. Hot nodules may cause symptoms that include:  Weight loss.  Warm, flushed skin.  Feeling hot.  Feeling nervous.  A racing heartbeat. Cold nodules may cause symptoms that include:  Weight gain.  Dry skin.  Brittle hair. This may also occur with hair loss.  Feeling cold.  Fatigue. Thyroid cancer nodules may cause symptoms that include:  Hard nodules that feel stuck to the thyroid gland.  Hoarseness.  Lumps in the glands near your thyroid (lymph nodes). How is this diagnosed? A thyroid nodule may be felt by your health care provider during a physical exam. This condition may also be diagnosed based on your symptoms. You may also have tests, including:  An ultrasound. This may be done to confirm the diagnosis.  A biopsy. This involves taking a sample from the nodule and looking at it under a microscope to see if the nodule is benign.  Blood tests to make sure that your thyroid is working properly.  Imaging tests such as MRI or CT scan may be done if:  Your nodule is large.  Your nodule is blocking your airway.  Cancer is suspected. How is  this treated? Treatment depends on the cause and size of your nodule or nodules. If the nodule is benign, treatment may not be necessary. Your health care provider may monitor the nodule to see if it goes away without treatment. If the nodule continues to grow, is cancerous, or does not go away:  It may need to be drained with a needle.  It may need to be removed with surgery. If you have surgery, part or all of your thyroid gland may need to be removed as well. Follow these instructions at home:  Pay attention to any changes in your nodule.  Take over-the-counter and prescription medicines only as told by your health care provider.  Keep all follow-up visits as told by your health care provider. This is important. Contact a health care provider if:  Your voice changes.  You have trouble swallowing.  You have pain in your neck, ear, or jaw that is getting worse.  Your nodule gets bigger.  Your nodule starts to make it harder for you to breathe. Get help right away if:  You have a sudden fever.  You feel very weak.  Your muscles look like they are shrinking (muscle wasting).  You have mood swings.  You feel very restless.  You feel confused.  You are seeing or hearing things that other people do not see or hear (having hallucinations).  You feel suddenly nauseous or throw up.  You suddenly have diarrhea.  You have chest pain.  There is a loss of consciousness. This information is not intended to replace advice given to you by your health care provider. Make sure you discuss any questions you have with your health care provider. Document Released: 10/13/2004 Document Revised: 07/23/2016 Document Reviewed: 03/03/2015 Elsevier Interactive Patient Education  2017 [employer].

## 2016-12-27 DIAGNOSIS — R0602 Shortness of breath: Secondary | ICD-10-CM | POA: Diagnosis not present

## 2016-12-27 DIAGNOSIS — I5032 Chronic diastolic (congestive) heart failure: Secondary | ICD-10-CM

## 2016-12-27 LAB — GLUCOSE, CAPILLARY
Glucose-Capillary: 110 mg/dL — ABNORMAL HIGH (ref 65–99)
Glucose-Capillary: 79 mg/dL (ref 65–99)
Glucose-Capillary: 82 mg/dL (ref 65–99)

## 2016-12-27 NOTE — Progress Notes (Signed)
Daily Session Note  Patient Details  Name: [name] [name] MRN: [id number] Date of Birth: 1935/01/09 Referring Provider:   Flowsheet Row Pulmonary Rehab from 12/19/2016 in [facility] Cardiac and Pulmonary Rehab  Referring Provider  [place]      Encounter Date: 12/27/2016  Check In:     Session Check In - 12/27/16 1205      Check-In   Location [facility]-Cardiac & Pulmonary Rehab   Staff Present Alberteen Sam, MA, ACSM RCEP, Exercise Physiologist;Amanda Oletta Darter, BA, ACSM CEP, Exercise Physiologist;Laureen Janell Quiet, RRT, Respiratory Therapist   Supervising physician immediately available to respond to emergencies [facility] immediately available ER MD   Physician(s) Cinda Quest and Lord   Medication changes reported     No   Fall or balance concerns reported    No   Warm-up and Cool-down Performed as group-led Location manager Performed Yes   VAD Patient? No     Pain Assessment   Currently in Pain? No/denies           Exercise Prescription Changes - 12/27/16 1200      Response to Exercise   Blood Pressure (Admit) 112/54   Blood Pressure (Exercise) 124/66   Blood Pressure (Exit) 108/50   Heart Rate (Admit) 79 bpm   Heart Rate (Exercise) 77 bpm   Heart Rate (Exit) 71 bpm   Oxygen Saturation (Admit) 98 %   Oxygen Saturation (Exercise) 98 %   Oxygen Saturation (Exit) 100 %   Rating of Perceived Exertion (Exercise) 13   Perceived Dyspnea (Exercise) 2   Duration Progress to 45 minutes of aerobic exercise without signs/symptoms of physical distress   Intensity THRR unchanged     Progression   Progression Continue to progress workloads to maintain intensity without signs/symptoms of physical distress.     Resistance Training   Training Prescription Yes   Weight 2   Reps 10-15     Treadmill   MPH 1   Grade 0   Minutes 15   METs 1.77     NuStep   Level 3   Minutes 15      Goals Met:  Proper associated with RPD/PD & O2 Sat Independence with exercise  equipment Exercise tolerated well Strength training completed today  Goals Unmet:  Not Applicable  Comments: First full day of exercise!  Patient was oriented to gym and equipment including functions, settings, policies, and procedures.  Patient's individual exercise prescription and treatment plan were reviewed.  All starting workloads were established based on the results of the 6 minute walk test done at initial orientation visit.  The plan for exercise progression was also introduced and progression will be customized based on patient's performance and goals.    Dr. Emily Filbert is Medical Director for [facility] and [facility] Pulmonary Rehabilitation.

## 2016-12-29 ENCOUNTER — Ambulatory Visit
Admission: RE | Admit: 2016-12-29 | Discharge: 2016-12-29 | Disposition: A | Discharge status: Home / Self Care | Payer: Medicare Other | Source: Ambulatory Visit | Attending: Cardiovascular Disease | Admitting: Cardiovascular Disease

## 2016-12-29 ENCOUNTER — Encounter: Payer: Medicare Other | Admitting: *Deleted

## 2016-12-29 DIAGNOSIS — E041 Nontoxic single thyroid nodule: Secondary | ICD-10-CM | POA: Insufficient documentation

## 2016-12-29 DIAGNOSIS — R0602 Shortness of breath: Secondary | ICD-10-CM

## 2016-12-29 DIAGNOSIS — E042 Nontoxic multinodular goiter: Secondary | ICD-10-CM | POA: Diagnosis not present

## 2016-12-29 DIAGNOSIS — I5033 Acute on chronic diastolic (congestive) heart failure: Secondary | ICD-10-CM | POA: Diagnosis not present

## 2016-12-29 DIAGNOSIS — I493 Ventricular premature depolarization: Secondary | ICD-10-CM

## 2016-12-29 DIAGNOSIS — R002 Palpitations: Secondary | ICD-10-CM

## 2016-12-29 DIAGNOSIS — I480 Paroxysmal atrial fibrillation: Secondary | ICD-10-CM

## 2016-12-29 DIAGNOSIS — I5032 Chronic diastolic (congestive) heart failure: Secondary | ICD-10-CM

## 2016-12-29 DIAGNOSIS — Z950 Presence of cardiac pacemaker: Secondary | ICD-10-CM | POA: Diagnosis not present

## 2016-12-29 DIAGNOSIS — E119 Type 2 diabetes mellitus without complications: Secondary | ICD-10-CM | POA: Diagnosis not present

## 2016-12-29 DIAGNOSIS — E785 Hyperlipidemia, unspecified: Secondary | ICD-10-CM | POA: Insufficient documentation

## 2016-12-29 LAB — GLUCOSE, CAPILLARY
Glucose-Capillary: 130 mg/dL — ABNORMAL HIGH (ref 65–99)
Glucose-Capillary: 111 mg/dL — ABNORMAL HIGH (ref 65–99)
Glucose-Capillary: 85 mg/dL (ref 65–99)

## 2016-12-29 NOTE — Progress Notes (Signed)
Daily Session Note  Patient Details  Name: [name] [name] MRN: [id number] Date of Birth: 1935-05-28 Referring Provider:   April Manson Pulmonary Rehab from 12/19/2016 in [facility] Cardiac and Pulmonary Rehab  Referring Provider  Gollan      Encounter Date: 12/29/2016  Check In:     Session Check In - 12/29/16 1029      Check-In   Location [facility]-Cardiac & Pulmonary Rehab   Staff Present Gerlene Burdock, RN, Vickki Hearing, BA, ACSM CEP, Exercise Physiologist;Dilpreet Faires RN BSN   Supervising physician immediately available to respond to emergencies [facility] immediately available ER MD   Physician(s) Drs. Quentin Cornwall and Paduchowski   Medication changes reported     No   Fall or balance concerns reported    No   Warm-up and Cool-down Not performed (comment)   Resistance Training Performed Yes   VAD Patient? No     Pain Assessment   Currently in Pain? No/denies   Multiple Pain Sites No         Goals Met:  Proper associated with RPD/PD & O2 Sat No report of cardiac concerns or symptoms Strength training completed today  Goals Unmet:  Not Applicable  Comments: [name] was unable to follow exercise prescription today, due to pt became dizzy on the treadmill.  She was advised to sit in chair, where we obtained BP and blood glucose.  BP 140/60 and BG 111.  Pt sat for 5 minutes and stated she felt better and continued on to next machine, T4.  Pt did well with no other complaints today.  Will continue to monitor for progression.    Dr. Emily Filbert is Medical Director for [place] and [facility] Pulmonary Rehabilitation.

## 2017-01-01 ENCOUNTER — Encounter: Payer: Medicare Other | Admitting: *Deleted

## 2017-01-01 DIAGNOSIS — I5032 Chronic diastolic (congestive) heart failure: Secondary | ICD-10-CM | POA: Diagnosis not present

## 2017-01-01 DIAGNOSIS — R0602 Shortness of breath: Secondary | ICD-10-CM | POA: Diagnosis not present

## 2017-01-01 LAB — GLUCOSE, CAPILLARY
Glucose-Capillary: 181 mg/dL — ABNORMAL HIGH (ref 65–99)
Glucose-Capillary: 91 mg/dL (ref 65–99)

## 2017-01-01 NOTE — Progress Notes (Signed)
Daily Session Note  Patient Details  Name: [name] [name] MRN: [id number] Date of Birth: 14-Dec-1934 Referring Provider:   April Manson Pulmonary Rehab from 12/19/2016 in [facility] Cardiac and Pulmonary Rehab  Referring Provider  Gollan      Encounter Date: 01/01/2017  Check In:     Session Check In - 01/01/17 1014      Check-In   Location [facility]-Cardiac & Pulmonary Rehab   Staff Present Nada Maclachlan, BA, ACSM CEP, Exercise Physiologist;Laureen Owens Shark, BS, RRT, Respiratory Bertis Ruddy, BS, ACSM CEP, Exercise Physiologist   Supervising physician immediately available to respond to emergencies [facility] immediately available ER MD   Physician(s) Drs. Kinner and Paduchowski   Medication changes reported     No   Fall or balance concerns reported    No   Warm-up and Cool-down Performed on first and last piece of equipment   Resistance Training Performed Yes   VAD Patient? No     Pain Assessment   Currently in Pain? No/denies   Multiple Pain Sites No         Goals Met:  Proper associated with RPD/PD & O2 Sat Independence with exercise equipment Exercise tolerated well Personal goals reviewed Strength training completed today  Goals Unmet:  Not Applicable  Comments: Pt able to follow exercise prescription today without complaint.  Will continue to monitor for progression.    Dr. Emily Filbert is Medical Director for [place] and [facility] Pulmonary Rehabilitation.

## 2017-01-03 ENCOUNTER — Encounter: Payer: Medicare Other | Admitting: *Deleted

## 2017-01-03 DIAGNOSIS — I5032 Chronic diastolic (congestive) heart failure: Secondary | ICD-10-CM | POA: Diagnosis not present

## 2017-01-03 DIAGNOSIS — R0602 Shortness of breath: Secondary | ICD-10-CM | POA: Diagnosis not present

## 2017-01-03 LAB — GLUCOSE, CAPILLARY
Glucose-Capillary: 158 mg/dL — ABNORMAL HIGH (ref 65–99)
Glucose-Capillary: 134 mg/dL — ABNORMAL HIGH (ref 65–99)

## 2017-01-03 NOTE — Progress Notes (Signed)
Daily Session Note  Patient Details  Name: [name] [name] MRN: [id number] Date of Birth: 03/06/1935 Referring Provider:   Flowsheet Row Pulmonary Rehab from 12/19/2016 in [facility]  Referring Provider  Gollan      Encounter Date: 01/03/2017  Check In:     Session Check In - 01/03/17 1123      Check-In   Location [facility]   Staff Present Laureen Brown, BS, RRT, Respiratory Therapist;Jessica Hawkins, MA, ACSM RCEP, Exercise Physiologist;Amanda Sommer, BA, ACSM CEP, Exercise Physiologist;  RN BSN   Supervising physician immediately available to respond to emergencies [facility] immediately available ER MD   Physician(s) Drs. Malinda and Williams   Medication changes reported     No   Fall or balance concerns reported    No   Warm-up and Cool-down Performed as group-led instruction   Resistance Training Performed Yes   VAD Patient? No     Pain Assessment   Currently in Pain? No/denies   Multiple Pain Sites No         Goals Met:  Proper associated with RPD/PD & O2 Sat Independence with exercise equipment Using PLB without cueing & demonstrates good technique Exercise tolerated well Strength training completed today  Goals Unmet:  Not Applicable  Comments: Pt able to follow exercise prescription today without complaint.  Will continue to monitor for progression.    Dr. Mark Miller is Medical Director for [facility] and [facility]. 

## 2017-01-04 ENCOUNTER — Telehealth: Payer: Self-pay | Admitting: Cardiovascular Disease

## 2017-01-04 NOTE — Telephone Encounter (Signed)
I am mailing another copy of carotid u/s to pt's home address. Dr. Trena Platt office was copied on results - pt states that she will contact their office, as well, to set up appt.

## 2017-01-04 NOTE — Telephone Encounter (Signed)
Pt calling stating she was told that we were going to mail her the results on the ultra sounds she did She has not received them yet The primary care providers has yet to call her either.  She states she just needs to know what is going on.  Please advise

## 2017-01-05 ENCOUNTER — Other Ambulatory Visit: Payer: Self-pay | Admitting: Cardiovascular Disease

## 2017-01-05 ENCOUNTER — Encounter: Payer: Medicare Other | Attending: Cardiovascular Disease | Admitting: *Deleted

## 2017-01-05 DIAGNOSIS — Z781 Physical restraint status: Secondary | ICD-10-CM | POA: Diagnosis not present

## 2017-01-05 DIAGNOSIS — I5032 Chronic diastolic (congestive) heart failure: Secondary | ICD-10-CM | POA: Diagnosis not present

## 2017-01-05 DIAGNOSIS — R0602 Shortness of breath: Secondary | ICD-10-CM | POA: Diagnosis not present

## 2017-01-05 NOTE — Progress Notes (Signed)
Daily Session Note  Patient Details  Name: [name] [name] MRN: [id number] Date of Birth: June 08, 1935 Referring Provider:   Flowsheet Row Pulmonary Rehab from 12/19/2016 in [facility]  Referring Provider  Gollan      Encounter Date: 01/05/2017  Check In:     Session Check In - 01/05/17 1010      Check-In   Location [facility]   Staff Present Gerlene Burdock, RN, Vickki Hearing, BA, ACSM CEP, Exercise Physiologist;Jessica Luan Pulling, MA, ACSM RCEP, Exercise Physiologist   Supervising physician immediately available to respond to emergencies [facility] immediately available ER MD   Physician(s) Dr. Jimmye Norman and Dr. Marcelene Butte   Medication changes reported     No   Fall or balance concerns reported    No   Warm-up and Cool-down Performed on first and last piece of equipment   Resistance Training Performed Yes   VAD Patient? No     Pain Assessment   Currently in Pain? No/denies           Exercise Prescription Changes - 01/05/17 0900      Response to Exercise   Blood Pressure (Admit) 134/70   Blood Pressure (Exercise) 148/82   Blood Pressure (Exit) 120/74   Heart Rate (Admit) 79 bpm   Heart Rate (Exercise) 68 bpm   Heart Rate (Exit) 60 bpm   Oxygen Saturation (Admit) 98 %   Oxygen Saturation (Exercise) 98 %   Oxygen Saturation (Exit) 99 %   Rating of Perceived Exertion (Exercise) 13   Perceived Dyspnea (Exercise) 3   Duration Progress to 45 minutes of aerobic exercise without signs/symptoms of physical distress   Intensity THRR unchanged     Progression   Progression Continue to progress workloads to maintain intensity without signs/symptoms of physical distress.     Resistance Training   Training Prescription Yes   Weight 2   Reps 10-15     NuStep   Level 3   Minutes 15     Biostep-RELP   Level 2   Minutes 15   METs 2      Goals Met:  Proper associated with RPD/PD & O2 Sat Exercise tolerated well  Goals Unmet:   Not Applicable  Comments:     Dr. Emily Filbert is Medical Director for [facility] and [facility].

## 2017-01-07 ENCOUNTER — Other Ambulatory Visit: Payer: Self-pay | Admitting: Family Medicine

## 2017-01-08 ENCOUNTER — Encounter: Payer: Self-pay | Admitting: Family Medicine

## 2017-01-08 ENCOUNTER — Ambulatory Visit (INDEPENDENT_AMBULATORY_CARE_PROVIDER_SITE_OTHER): Payer: Medicare Other | Admitting: Family Medicine

## 2017-01-08 VITALS — BP 130/70 | HR 78 | Temp 98.2°F | Resp 16 | Ht 65.0 in | Wt 246.0 lb

## 2017-01-08 DIAGNOSIS — E042 Nontoxic multinodular goiter: Secondary | ICD-10-CM

## 2017-01-08 DIAGNOSIS — R0602 Shortness of breath: Secondary | ICD-10-CM | POA: Diagnosis not present

## 2017-01-08 DIAGNOSIS — I5032 Chronic diastolic (congestive) heart failure: Secondary | ICD-10-CM

## 2017-01-08 NOTE — Progress Notes (Signed)
Name: [name] [name]   MRN: [id number]    DOB: Oct 10, 1935   Date:01/08/2017       Progress Note  Subjective  Chief Complaint  Chief Complaint  Patient presents with  . Thyroid Problem    Muliple nodules found on Ultrasound.     HPI  Pt. Presents for follow up on an Ultrasound of Thyroid ordered by her cardiologist Dr. Rockey Situ, which showed several nodules bilaterally which are greater than 1.0 cm, the ultrasound indicated that the nodules have grown in size, radiologist recommended tissue sampling of dominant left and right nodules for further evaluation. Patient reports no symptoms of of overactive or underactive thyroid, no neck swelling. She has no history of thyroid disorders in family but her mother used to have a 'small goiter'   Past Medical History:  Diagnosis Date  . 1st degree AV block    a. PR 400 msec with Apacing  . Anemia   . Anxiety   . Arthritis    a. shoulders.  . Chest pain    a. 2004 Cath:  reportedly nl;  b. 09/2013 Neg MV.  . Chronic diastolic heart failure ([place])    a. 09/2013 Echo: EF 50-55%, mild LVH, mild to mod MR/TR.  Marland Kitchen Esophageal reflux   . H/O hiatal hernia   . History of UTI   . Hyperlipidemia   . Morbid obesity ([place])   . OSA on CPAP   . PAF (paroxysmal atrial fibrillation) ([place])   . SSS (sick sinus syndrome) ([place])    a. 2008 s/p MDT PPM;  b. 11/2013 Gen change- MDT ADDRL1 Adapta DC PPM, ser # [id number].  . Symptomatic PVCs   . Syncope and collapse    a. Felt to be vasovagal.  . Type II diabetes mellitus ([place])   . Unspecified essential hypertension   . Unspecified glaucoma(365.9)     Past Surgical History:  Procedure Laterality Date  . DILATION AND CURETTAGE OF UTERUS    . INCISION AND DRAINAGE OF WOUND Right 03/1998   "leg; it was like a boil" (12/01/2013)  . INSERT / REPLACE / REMOVE PACEMAKER  2008; 12/01/2013   MDT ADDRL1 pacemaker - gen change by Dr Caryl Comes 12/01/2013  . LUNG BIOPSY  2010   [facility]  . PERMANENT PACEMAKER GENERATOR CHANGE  N/A 12/01/2013   Procedure: PERMANENT PACEMAKER GENERATOR CHANGE;  Surgeon: Deboraha Sprang, MD;  Location: [facility] CATH LAB;  Service: Cardiovascular;  Laterality: N/A;  . VAGINAL HYSTERECTOMY  1970    Family History  Problem Relation Age of Onset  . Heart disease Father   . Rheum arthritis Father   . Allergies Father   . Liver cancer Mother   . Pancreatic cancer Mother     Social History   Social History  . Marital status: Widowed    Spouse name: N/A  . Number of children: Y  . Years of education: N/A   Occupational History  . retired    Social History Main Topics  . Smoking status: Former Smoker    Packs/day: 0.50    Years: 34.00    Types: Cigarettes    Quit date: 08/15/1989  . Smokeless tobacco: Never Used  . Alcohol use No  . Drug use: No  . Sexual activity: No   Other Topics Concern  . Not on file   Social History Narrative   Retired. Widowed. Regularly exercises.      Current Outpatient Prescriptions:  .  acetaminophen (TYLENOL) 500 MG tablet, Take 500  mg by mouth every 6 (six) hours as needed., Disp: , Rfl:  .  ALPRAZolam (XANAX) 0.5 MG tablet, Take 1 tablet (0.5 mg total) by mouth daily as needed., Disp: 90 tablet, Rfl: 0 .  amLODipine (NORVASC) 10 MG tablet, TAKE 1 TABLET (10 MG TOTAL) BY MOUTH DAILY., Disp: 90 tablet, Rfl: 0 .  atorvastatin (LIPITOR) 20 MG tablet, TAKE 1 TABLET (20 MG TOTAL) BY MOUTH DAILY., Disp: 90 tablet, Rfl: 0 .  Brinzolamide-Brimonidine (SIMBRINZA) 1-0.2 % SUSP, Place 1 drop into both eyes 2 (two) times daily., Disp: 1 Bottle, Rfl: 1 .  carvedilol (COREG) 12.5 MG tablet, Take 1 tablet (12.5 mg) in the am and 1 and 1/2 tablet in the evening., Disp: , Rfl:  .  carvedilol (COREG) 12.5 MG tablet, Take 1 tablet in the morning with breakfast and 2 tablets pm daily by mouth in the evening before bed., Disp: 90 tablet, Rfl: 3 .  doxycycline (VIBRA-TABS) 100 MG tablet, Take 1 tablet (100 mg total) by mouth 2 (two) times daily., Disp: 14 tablet,  Rfl: 0 .  esomeprazole (NEXIUM) 40 MG capsule, TAKE 1 CAPSULE (40 MG TOTAL) BY MOUTH DAILY., Disp: 90 capsule, Rfl: 1 .  flecainide (TAMBOCOR) 50 MG tablet, Take 1 tablet (50 mg total) by mouth 2 (two) times daily., Disp: 60 tablet, Rfl: 6 .  fluticasone (FLONASE) 50 MCG/ACT nasal spray, Place 2 sprays into both nostrils daily., Disp: 16 g, Rfl: 0 .  furosemide (LASIX) 20 MG tablet, TAKE 1 TABLET (20 MG TOTAL) BY MOUTH DAILY., Disp: 90 tablet, Rfl: 0 .  glipiZIDE (GLUCOTROL) 10 MG tablet, Take 1 tablet (10 mg total) by mouth 2 (two) times daily before a meal., Disp: 180 tablet, Rfl: 0 .  metFORMIN (GLUCOPHAGE-XR) 500 MG 24 hr tablet, TAKE 1 TABLET (500 MG TOTAL) BY MOUTH 2 (TWO) TIMES DAILY., Disp: 180 tablet, Rfl: 0 .  mometasone (ELOCON) 0.1 % cream, Apply topically daily. Apply to ear as directed two times a day for 10 days as needed, Disp: 45 g, Rfl: 0 .  Multiple Vitamins-Minerals (CENTRUM SILVER PO), Take by mouth daily., Disp: , Rfl:  .  olmesartan (BENICAR) 40 MG tablet, TAKE 1 TABLET (40 MG TOTAL) BY MOUTH DAILY., Disp: 90 tablet, Rfl: 0 .  TRADJENTA 5 MG TABS tablet, TAKE 1 TABLET (5 MG TOTAL) BY MOUTH DAILY., Disp: 30 tablet, Rfl: 2 .  travoprost, benzalkonium, (TRAVATAN) 0.004 % ophthalmic solution, Place 1 drop into both eyes at bedtime. , Disp: , Rfl:  .  warfarin (COUMADIN) 3 MG tablet, TAKE 1-1.5 TABLETS DAILY AT 6 PM. TAKE 1 TAB ON TUES, THURS, SAT, SUN. TAKE 4.5 MG ON MON, WED, FRI, Disp: 100 tablet, Rfl: 3  Allergies  Allergen Reactions  . Diltiazem Other (See Comments)    Patient stated that she had bad dreams with this medication  . Metoprolol Other (See Comments)    Bad dreams Patient stated that she had suicidal thoughts while taking this medication.  . Levofloxacin In D5w Nausea Only and Nausea And Vomiting     ROS  Please see history of present illness for complete discussion of ROS  Objective  Vitals:   01/08/17 1313  BP: 130/70  Pulse: 78  Resp: 16    Temp: 98.2 F (36.8 C)  TempSrc: Oral  SpO2: 95%  Weight: 246 lb (111.6 kg)  Height: 5\' 5"  (1.651 m)    Physical Exam  Constitutional: She is well-developed, well-nourished, and in no distress.  Neck: Neck  supple. No erythema present. No thyromegaly present.    Bilateral palpable masses in the neck, corresponding to the thyroid nodules.  Cardiovascular: Normal rate, regular rhythm, S1 normal and S2 normal.   Murmur heard.  Systolic murmur is present with a grade of 2/6  Pulmonary/Chest: Breath sounds normal. She has no wheezes. She has no rhonchi.  Nursing note and vitals reviewed.    Assessment & Plan  1. Multiple thyroid nodules We will refer to endocrinology for fine-needle aspiration of the masses as indicated in the radiology report. Patient is euthyroid based on history and exam at this time. - Ambulatory referral to Endocrinology   [facility] 01/08/2017 1:31 PM

## 2017-01-08 NOTE — Progress Notes (Signed)
Daily Session Note  Patient Details  Name: [name] [name] MRN: [id number] Date of Birth: 01-31-1935 Referring Provider:   Flowsheet Row Pulmonary Rehab from 12/19/2016 in [facility]  Referring Provider  Gollan      Encounter Date: 01/08/2017  Check In:     Session Check In - 01/08/17 1212      Check-In   Location [facility]   Staff Present Carson Myrtle, BS, RRT, Respiratory Therapist;Kelly Amedeo Plenty, BS, ACSM CEP, Exercise Physiologist;Colbi Staubs Oletta Darter, BA, ACSM CEP, Exercise Physiologist   Supervising physician immediately available to respond to emergencies [facility] immediately available ER MD   Physician(s) Jimmye Norman and Corky Downs   Medication changes reported     No   Fall or balance concerns reported    No   Warm-up and Cool-down Performed as group-led Location manager Performed Yes   VAD Patient? No     Pain Assessment   Currently in Pain? No/denies   Multiple Pain Sites No         Goals Met:  Proper associated with RPD/PD & O2 Sat Independence with exercise equipment Exercise tolerated well Strength training completed today  Goals Unmet:  Not Applicable  Comments: Pt able to follow exercise prescription today without complaint.  Will continue to monitor for progression.    Dr. Emily Filbert is Medical Director for [facility] and [facility].

## 2017-01-10 ENCOUNTER — Encounter: Payer: Medicare Other | Admitting: *Deleted

## 2017-01-10 DIAGNOSIS — I5032 Chronic diastolic (congestive) heart failure: Secondary | ICD-10-CM

## 2017-01-10 DIAGNOSIS — R0602 Shortness of breath: Secondary | ICD-10-CM | POA: Diagnosis not present

## 2017-01-10 NOTE — Progress Notes (Addendum)
Daily Session Note  Patient Details  Name: [name] [name] MRN: [id number] Date of Birth: [birth year] Referring Provider:   April Manson Pulmonary Rehab from 12/19/2016 in [facility]  Referring Provider  Gollan      Encounter Date: 01/10/2017  Check In:     Session Check In - 01/10/17 1006      Check-In   Location [facility]   Staff Present Alberteen Sam, MA, ACSM RCEP, Exercise Physiologist;Laureen Owens Shark, BS, RRT, Respiratory Dareen Piano, BA, ACSM CEP, Exercise Physiologist   Supervising physician immediately available to respond to emergencies [facility] immediately available ER MD   Physician(s) Drs. Malinda and Robinson   Medication changes reported     No   Fall or balance concerns reported    No   Warm-up and Cool-down Performed as group-led Location manager Performed Yes   VAD Patient? No     Pain Assessment   Currently in Pain? No/denies   Multiple Pain Sites No         Goals Met:  Proper associated with RPD/PD & O2 Sat Independence with exercise equipment Using PLB without cueing & demonstrates good technique Exercise tolerated well Strength training completed today  Goals Unmet:  Not Applicable  Comments: Pt able to follow exercise prescription today without complaint.  Will continue to monitor for progression.  Reviewed home exercise with pt today.  Pt plans to use Silver Sneakers at [facility] and walk at home for exercise.  Reviewed THR, pulse, RPE, sign and symptoms, NTG use, and when to call 911 or MD.  Also discussed weather considerations and indoor options.  Pt voiced understanding.   Dr. Emily Filbert is Medical Director for [facility] and [facility].

## 2017-01-12 DIAGNOSIS — E042 Nontoxic multinodular goiter: Secondary | ICD-10-CM | POA: Diagnosis not present

## 2017-01-14 ENCOUNTER — Other Ambulatory Visit: Payer: Self-pay | Admitting: Family Medicine

## 2017-01-14 DIAGNOSIS — J0100 Acute maxillary sinusitis, unspecified: Secondary | ICD-10-CM

## 2017-01-15 ENCOUNTER — Ambulatory Visit (INDEPENDENT_AMBULATORY_CARE_PROVIDER_SITE_OTHER): Payer: Medicare Other | Admitting: Podiatry

## 2017-01-15 ENCOUNTER — Encounter: Payer: Self-pay | Admitting: Respiratory Therapy

## 2017-01-15 ENCOUNTER — Encounter: Payer: Self-pay | Admitting: Podiatry

## 2017-01-15 DIAGNOSIS — E0843 Diabetes mellitus due to underlying condition with diabetic autonomic (poly)neuropathy: Secondary | ICD-10-CM

## 2017-01-15 DIAGNOSIS — L851 Acquired keratosis [keratoderma] palmaris et plantaris: Secondary | ICD-10-CM | POA: Diagnosis not present

## 2017-01-15 DIAGNOSIS — B351 Tinea unguium: Secondary | ICD-10-CM | POA: Diagnosis not present

## 2017-01-15 DIAGNOSIS — M79609 Pain in unspecified limb: Secondary | ICD-10-CM | POA: Diagnosis not present

## 2017-01-15 DIAGNOSIS — R0602 Shortness of breath: Secondary | ICD-10-CM | POA: Diagnosis not present

## 2017-01-15 DIAGNOSIS — I5032 Chronic diastolic (congestive) heart failure: Secondary | ICD-10-CM | POA: Diagnosis not present

## 2017-01-15 NOTE — Progress Notes (Signed)
Pulmonary Individual Treatment Plan  Patient Details  Name: [name] [name] MRN: [id number] Date of Birth: 01-17-1935 Referring Provider:   Flowsheet Row Pulmonary Rehab from 12/19/2016 in [facility] Cardiac and Pulmonary Rehab  Referring Provider  Gollan      Initial Encounter Date:  Flowsheet Row Pulmonary Rehab from 12/19/2016 in [facility] Cardiac and Pulmonary Rehab  Date  12/19/16  Referring Provider  Rockey Situ      Visit Diagnosis: Chronic diastolic congestive heart failure ([place])  Patient's Home Medications on Admission:  Current Outpatient Prescriptions:    acetaminophen (TYLENOL) 500 MG tablet, Take 500 mg by mouth every 6 (six) hours as needed., Disp: , Rfl:    ALPRAZolam (XANAX) 0.5 MG tablet, Take 1 tablet (0.5 mg total) by mouth daily as needed., Disp: 90 tablet, Rfl: 0   amLODipine (NORVASC) 10 MG tablet, TAKE 1 TABLET (10 MG TOTAL) BY MOUTH DAILY., Disp: 90 tablet, Rfl: 0   atorvastatin (LIPITOR) 20 MG tablet, TAKE 1 TABLET (20 MG TOTAL) BY MOUTH DAILY., Disp: 90 tablet, Rfl: 0   Brinzolamide-Brimonidine (SIMBRINZA) 1-0.2 % SUSP, Place 1 drop into both eyes 2 (two) times daily., Disp: 1 Bottle, Rfl: 1   carvedilol (COREG) 12.5 MG tablet, Take 1 tablet (12.5 mg) in the am and 1 and 1/2 tablet in the evening., Disp: , Rfl:    carvedilol (COREG) 12.5 MG tablet, Take 1 tablet in the morning with breakfast and 2 tablets pm daily by mouth in the evening before bed., Disp: 90 tablet, Rfl: 3   doxycycline (VIBRA-TABS) 100 MG tablet, Take 1 tablet (100 mg total) by mouth 2 (two) times daily., Disp: 14 tablet, Rfl: 0   esomeprazole (NEXIUM) 40 MG capsule, TAKE 1 CAPSULE (40 MG TOTAL) BY MOUTH DAILY., Disp: 90 capsule, Rfl: 1   flecainide (TAMBOCOR) 50 MG tablet, Take 1 tablet (50 mg total) by mouth 2 (two) times daily., Disp: 60 tablet, Rfl: 6   fluticasone (FLONASE) 50 MCG/ACT nasal spray, Place 2 sprays into both nostrils daily., Disp: 16 g, Rfl: 0   furosemide (LASIX) 20 MG tablet,  TAKE 1 TABLET (20 MG TOTAL) BY MOUTH DAILY., Disp: 90 tablet, Rfl: 0   glipiZIDE (GLUCOTROL) 10 MG tablet, Take 1 tablet (10 mg total) by mouth 2 (two) times daily before a meal., Disp: 180 tablet, Rfl: 0   metFORMIN (GLUCOPHAGE-XR) 500 MG 24 hr tablet, TAKE 1 TABLET (500 MG TOTAL) BY MOUTH 2 (TWO) TIMES DAILY., Disp: 180 tablet, Rfl: 0   mometasone (ELOCON) 0.1 % cream, Apply topically daily. Apply to ear as directed two times a day for 10 days as needed, Disp: 45 g, Rfl: 0   Multiple Vitamins-Minerals (CENTRUM SILVER PO), Take by mouth daily., Disp: , Rfl:    olmesartan (BENICAR) 40 MG tablet, TAKE 1 TABLET (40 MG TOTAL) BY MOUTH DAILY., Disp: 90 tablet, Rfl: 0   TRADJENTA 5 MG TABS tablet, TAKE 1 TABLET (5 MG TOTAL) BY MOUTH DAILY., Disp: 30 tablet, Rfl: 2   travoprost, benzalkonium, (TRAVATAN) 0.004 % ophthalmic solution, Place 1 drop into both eyes at bedtime. , Disp: , Rfl:    warfarin (COUMADIN) 3 MG tablet, TAKE 1-1.5 TABLETS DAILY AT 6 PM. TAKE 1 TAB ON TUES, THURS, SAT, SUN. TAKE 4.5 MG ON MON, WED, FRI, Disp: 100 tablet, Rfl: 3  Past Medical History: Past Medical History:  Diagnosis Date   1st degree AV block    a. PR 400 msec with Apacing   Anemia    Anxiety  Arthritis    a. shoulders.   Chest pain    a. 2004 Cath:  reportedly nl;  b. 09/2013 Neg MV.   Chronic diastolic heart failure ([place])    a. 09/2013 Echo: EF 50-55%, mild LVH, mild to mod MR/TR.   Esophageal reflux    H/O hiatal hernia    History of UTI    Hyperlipidemia    Morbid obesity ([place])    OSA on CPAP    PAF (paroxysmal atrial fibrillation) (HCC)    SSS (sick sinus syndrome) ([place])    a. 2008 s/p MDT PPM;  b. 11/2013 Gen change- MDT ADDRL1 Adapta DC PPM, ser # [id number].   Symptomatic PVCs    Syncope and collapse    a. Felt to be vasovagal.   Type II diabetes mellitus (HCC)    Unspecified essential hypertension    Unspecified glaucoma(365.9)     Tobacco Use: History   Smoking Status   Former Smoker   Packs/day: 0.50   Years: 34.00   Types: Cigarettes   Quit date: 08/15/1989  Smokeless Tobacco   Never Used    Labs: Recent Review Flowsheet Data    Labs for ITP Cardiac and Pulmonary Rehab Latest Ref Rng & Units 09/16/2015 12/16/2015 03/15/2016 07/28/2016 11/01/2016   Cholestrol <200 mg/dL 146 153 144 - 156   LDLCALC <100 mg/dL 78 72 67 - 79   HDL >50 mg/dL 49 61 49 - 58   Trlycerides <150 mg/dL 97 100 138 - 93   Hemoglobin A1c - - 7.8 8.2 6.8 6.8       ADL UCSD:     Pulmonary Assessment Scores    Row Name 12/19/16 1158         ADL UCSD   ADL Phase Entry     SOB Score total 21     Rest 1     Walk 0     Stairs 2     Bath 0     Dress 0     Shop 0        Pulmonary Function Assessment:     Pulmonary Function Assessment - 12/19/16 1157      Initial Spirometry Results   FVC% 85 %   FEV1% 101 %   FEV1/FVC Ratio 91.51     Breath   Bilateral Breath Sounds Clear   Shortness of Breath Yes      Exercise Target Goals:    Exercise Program Goal: Individual exercise prescription set with THRR, safety & activity barriers. Participant demonstrates ability to understand and report RPE using BORG scale, to self-measure pulse accurately, and to acknowledge the importance of the exercise prescription.  Exercise Prescription Goal: Starting with aerobic activity 30 plus minutes a day, 3 days per week for initial exercise prescription. Provide home exercise prescription and guidelines that participant acknowledges understanding prior to discharge.  Activity Barriers & Risk Stratification:     Activity Barriers & Cardiac Risk Stratification - 12/19/16 1157      Activity Barriers & Cardiac Risk Stratification   Activity Barriers Arthritis;Back Problems;Joint Problems;Shortness of Breath;Deconditioning;Muscular Weakness      6 Minute Walk:     6 Minute Walk    Row Name 12/19/16 1201         6 Minute Walk   Distance 965  feet     Walk Time 6 minutes     # of Rest Breaks 0     MPH 1.82     METS 2.38  RPE 15     Perceived Dyspnea  2     VO2 Peak 5.43     Symptoms No     Resting HR 68 bpm     Resting BP 122/66     Max Ex. HR 113 bpm     Max Ex. BP 172/72       Interval HR   Baseline HR 68     1 Minute HR 110     2 Minute HR 104     3 Minute HR 102     5 Minute HR 98     6 Minute HR 113     Interval Heart Rate? Yes       Interval Oxygen   Interval Oxygen? Yes     Baseline Oxygen Saturation % 97 %     1 Minute Oxygen Saturation % 96 %     2 Minute Oxygen Saturation % 94 %     3 Minute Oxygen Saturation % 95 %     5 Minute Oxygen Saturation % 96 %     6 Minute Oxygen Saturation % 95 %        Initial Exercise Prescription:     Initial Exercise Prescription - 12/19/16 1200      Date of Initial Exercise RX and Referring Provider   Date 12/19/16   Referring Provider Gollan     Treadmill   MPH 1.5   Grade 0.5   Minutes 15   METs 2.25     Recumbant Bike   Level 1   RPM 60   Minutes 15   METs 2     NuStep   Level 3   Minutes 15   METs 2.25     Biostep-RELP   Level 2   Minutes 15   METs 2     Prescription Details   Frequency (times per week) 3   Duration Progress to 45 minutes of aerobic exercise without signs/symptoms of physical distress     Intensity   THRR 40-80% of Max Heartrate 97-126   Ratings of Perceived Exertion 11-13   Perceived Dyspnea 0-4     Progression   Progression Continue to progress workloads to maintain intensity without signs/symptoms of physical distress.     Resistance Training   Training Prescription Yes   Weight 3   Reps 10-15      Perform Capillary Blood Glucose checks as needed.  Exercise Prescription Changes:     Exercise Prescription Changes    Row Name 12/27/16 1200 01/05/17 0900 01/10/17 1100         Response to Exercise   Blood Pressure (Admit) 112/54 134/70  --     Blood Pressure (Exercise) 124/66 148/82  --      Blood Pressure (Exit) 108/50 120/74  --     Heart Rate (Admit) 79 bpm 79 bpm  --     Heart Rate (Exercise) 77 bpm 68 bpm  --     Heart Rate (Exit) 71 bpm 60 bpm  --     Oxygen Saturation (Admit) 98 % 98 %  --     Oxygen Saturation (Exercise) 98 % 98 %  --     Oxygen Saturation (Exit) 100 % 99 %  --     Rating of Perceived Exertion (Exercise) 13 13  --     Perceived Dyspnea (Exercise) 2 3  --     Comments  --  -- Home Exercise Guidelines given 01/10/17  Duration Progress to 45 minutes of aerobic exercise without signs/symptoms of physical distress Progress to 45 minutes of aerobic exercise without signs/symptoms of physical distress Progress to 45 minutes of aerobic exercise without signs/symptoms of physical distress     Intensity THRR unchanged THRR unchanged THRR unchanged       Progression   Progression Continue to progress workloads to maintain intensity without signs/symptoms of physical distress. Continue to progress workloads to maintain intensity without signs/symptoms of physical distress. Continue to progress workloads to maintain intensity without signs/symptoms of physical distress.       Resistance Training   Training Prescription Yes Yes Yes     Weight '2 2 2     ' Reps 10-15 10-15 10-15       Treadmill   MPH 1  --  --     Grade 0  --  --     Minutes 15  --  --     METs 1.77  --  --       NuStep   Level '3 3 3     ' Minutes '15 15 15       ' Biostep-RELP   Level  -- 2 2     Minutes  -- 15 15     METs  -- 2 2       Home Exercise Plan   Plans to continue exercise at  --  -- [employer] (comment)  [employer] at [employer] and walking     Frequency  --  -- Add 2 additional days to program exercise sessions.        Exercise Comments:     Exercise Comments    Row Name 12/29/16 1115 01/05/17 0946 01/10/17 1157       Exercise Comments [name] [name] was unable to follow exercise prescription today, due to pt became dizzy on the treadmill.  She was advised to sit in  chair, where we obtained BP and blood glucose.  BP 140/60 and BG 111.  Pt sat for 5 minutes and stated she felt better and continued on to next machine, T4.  Pt did well with no other complaints today.  Will continue to monitor for progression. [name] [name] has tolerated exrecise very well in her first week of exercise. Reviewed home exercise with pt today.  Pt plans to use [employer] at [facility] and walk at home for exercise.  Reviewed THR, pulse, RPE, sign and symptoms, NTG use, and when to call 911 or MD.  Also discussed weather considerations and indoor options.  Pt voiced understanding.        Discharge Exercise Prescription (Final Exercise Prescription Changes):     Exercise Prescription Changes - 01/10/17 1100      Response to Exercise   Comments Home Exercise Guidelines given 01/10/17   Duration Progress to 45 minutes of aerobic exercise without signs/symptoms of physical distress   Intensity THRR unchanged     Progression   Progression Continue to progress workloads to maintain intensity without signs/symptoms of physical distress.     Resistance Training   Training Prescription Yes   Weight 2   Reps 10-15     NuStep   Level 3   Minutes 15     Biostep-RELP   Level 2   Minutes 15   METs 2     Home Exercise Plan   Plans to continue exercise at [employer] (comment)  [employer] at [employer] and walking   Frequency Add 2 additional days to  program exercise sessions.       Nutrition:  Target Goals: Understanding of nutrition guidelines, daily intake of sodium <1510m, cholesterol <2040m calories 30% from fat and 7% or less from saturated fats, daily to have 5 or more servings of fruits and vegetables.  Biometrics:     Pre Biometrics - 12/19/16 1159      Pre Biometrics   Height '5\' 5"'  (1.651 m)   Weight 242 lb 12.8 oz (110.1 kg)   Waist Circumference 47 inches   Hip Circumference 51.5 inches   Waist to Hip Ratio 0.91 %   BMI (Calculated) 40.5       Nutrition  Therapy Plan and Nutrition Goals:   Nutrition Discharge: Rate Your Plate Scores:   Psychosocial: Target Goals: Acknowledge presence or absence of depression, maximize coping skills, provide positive support system. Participant is able to verbalize types and ability to use techniques and skills needed for reducing stress and depression.  Initial Review & Psychosocial Screening:     Initial Psych Review & Screening - 12/19/16 1205      Family Dynamics   Good Support System? Yes   Comments [name] [name] good support from her family. Her 2 sisters participate in the [facility] [facility]. She likes being active by visiting sick friends and shopping. She is looking forward to [employer]    Barriers   Psychosocial barriers to participate in program There are no identifiable barriers or psychosocial needs.;The patient should benefit from training in stress management and relaxation.     Screening Interventions   Interventions Encouraged to exercise;Program counselor consult      Quality of Life Scores:     Quality of Life - 12/19/16 1208      Quality of Life Scores   Health/Function Pre 21 %   Socioeconomic Pre 21 %   Psych/Spiritual Pre 20.86 %   Family Pre 20.4 %   GLOBAL Pre 20.71 %      PHQ-9: Recent Review Flowsheet Data    Depression screen [facility]/9 12/19/2016 12/19/2016 09/18/2016 03/23/2016 03/15/2016   Decreased Interest 2 0 0 0 0   Down, Depressed, Hopeless 0 0 0 0 0   PHQ - 2 Score 2 0 0 0 0   Altered sleeping 3 - - - -   Tired, decreased energy 3 - - - -   Change in appetite 0 - - - -   Feeling bad or failure about yourself  0 - - - -   Trouble concentrating 0 - - - -   Moving slowly or fidgety/restless 0 - - - -   Suicidal thoughts 0 - - - -   PHQ-9 Score 8 - - - -   Difficult doing work/chores Very difficult - - - -      Psychosocial Evaluation and Intervention:     Psychosocial Evaluation - 01/01/17 1115      Psychosocial Evaluation  & Interventions   Interventions Encouraged to exercise with the program and follow exercise prescription;Relaxation education;Stress management education   Comments Counselor met with [name] [name] today for initial psychosocial evaluation.  She is a lovely [age]ear old who has CHF and Diabetes and began struggling more with fatigue and shortness of breath.  Although she lives alone, she has a strong support system with a large family of sisters and (2) adult sons who live closeby.  She is also actively involved in her local church.  [name]. [employer] the  main problem she has is her interrupted sleep after 3-4 hours each night.  She has a CPAP and is prescribed Xanax to be taken as needed to help, but this continues to be problematic.  She has a good appetite and she reports she is typically in a positive mood.  [name] [name] denies a history of depression or current symptoms, but admits there has been some anxiety symptoms since she was diagnosed with CHF and is not resting well.  She states the medications help with these symptoms, but walking and moving is the best remedy she reports and is looking forward to doing that more consistently for her sleep as well as anxiety symptoms while in this program.  [name] [name] has goals to increase her strength, stamina and overall energy and get back to doing more normal household activities while in Pulmonary Rehab.  She would also like to sleep better and lose some weight.  Counselor encouraged her to see the dietician to discuss her nutrition and weight loss goals.  Counselor also encouraged [name] [name] to consistently exercise and move to improve her sleep quality and anxiety symptoms - which she reported had helped in the past.  Staff will follow with [name] [name] throughout the course of this program.     Continued Psychosocial Services Needed Yes  Follow with [name] [name] re: her sleep and anxiety symptoms -whether exercise has helped.      Psychosocial  Re-Evaluation:  Education: Education Goals: Education classes will be provided on a weekly basis, covering required topics. Participant will state understanding/return demonstration of topics presented.  Learning Barriers/Preferences:     Learning Barriers/Preferences - 12/19/16 1157      Learning Barriers/Preferences   Learning Barriers None   Learning Preferences None      Education Topics: Initial Evaluation Education: - Verbal, written and demonstration of respiratory meds, RPE/PD scales, oximetry and breathing techniques. Instruction on use of nebulizers and MDIs: cleaning and proper use, rinsing mouth with steroid doses and importance of monitoring MDI activations. Flowsheet Row Pulmonary Rehab from 01/10/2017 in [facility] Cardiac and Pulmonary Rehab  Date  12/19/16  Educator  LB  Instruction Review Code  2- meets goals/outcomes      General Nutrition Guidelines/Fats and Fiber: -Group instruction provided by verbal, written material, models and posters to present the general guidelines for heart healthy nutrition. Gives an explanation and review of dietary fats and fiber.   Controlling Sodium/Reading Food Labels: -Group verbal and written material supporting the discussion of sodium use in heart healthy nutrition. Review and explanation with models, verbal and written materials for utilization of the food label.   Exercise Physiology & Risk Factors: - Group verbal and written instruction with models to review the exercise physiology of the cardiovascular system and associated critical values. Details cardiovascular disease risk factors and the goals associated with each risk factor.   Aerobic Exercise & Resistance Training: - Gives group verbal and written discussion on the health impact of inactivity. On the components of aerobic and resistive training programs and the benefits of this training and how to safely progress through these programs. Flowsheet Row Pulmonary Rehab  from 01/10/2017 in [facility] Cardiac and Pulmonary Rehab  Date  01/03/17  Educator  [facility]  Instruction Review Code  2- meets goals/outcomes      Flexibility, Balance, General Exercise Guidelines: - Provides group verbal and written instruction on the benefits of flexibility and balance training programs. Provides general exercise guidelines with specific guidelines to those with heart  or lung disease. Demonstration and skill practice provided.   Stress Management: - Provides group verbal and written instruction about the health risks of elevated stress, cause of high stress, and healthy ways to reduce stress.   Depression: - Provides group verbal and written instruction on the correlation between heart/lung disease and depressed mood, treatment options, and the stigmas associated with seeking treatment.   Exercise & Equipment Safety: - Individual verbal instruction and demonstration of equipment use and safety with use of the equipment. Flowsheet Row Pulmonary Rehab from 01/10/2017 in [facility] Cardiac and Pulmonary Rehab  Date  12/27/16  Educator  [facility]  Instruction Review Code  2- meets goals/outcomes      Infection Prevention: - Provides verbal and written material to individual with discussion of infection control including proper hand washing and proper equipment cleaning during exercise session. Flowsheet Row Pulmonary Rehab from 01/10/2017 in [facility] Cardiac and Pulmonary Rehab  Date  12/19/16  Educator  LB  Instruction Review Code  2- meets goals/outcomes      Falls Prevention: - Provides verbal and written material to individual with discussion of falls prevention and safety. Flowsheet Row Pulmonary Rehab from 01/10/2017 in [facility] Cardiac and Pulmonary Rehab  Date  12/19/16  Educator  LB  Instruction Review Code  2- meets goals/outcomes      Diabetes: - Individual verbal and written instruction to review signs/symptoms of diabetes, desired ranges of glucose level fasting, after meals and  with exercise. Advice that pre and post exercise glucose checks will be done for 3 sessions at entry of program. Flowsheet Row Pulmonary Rehab from 01/10/2017 in [facility] Cardiac and Pulmonary Rehab  Date  12/19/16  Educator  LB  Instruction Review Code  2- meets goals/outcomes      Chronic Lung Diseases: - Group verbal and written instruction to review new updates, new respiratory medications, new advancements in procedures and treatments. Provide informative websites and "800" numbers of self-education. Flowsheet Row Pulmonary Rehab from 01/10/2017 in [facility] Cardiac and Pulmonary Rehab  Date  12/27/16  Educator  LB  Instruction Review Code  2- meets goals/outcomes      Lung Procedures: - Group verbal and written instruction to describe testing methods done to diagnose lung disease. Review the outcome of test results. Describe the treatment choices: Pulmonary Function Tests, ABGs and oximetry.   Energy Conservation: - Provide group verbal and written instruction for methods to conserve energy, plan and organize activities. Instruct on pacing techniques, use of adaptive equipment and posture/positioning to relieve shortness of breath.   Triggers: - Group verbal and written instruction to review types of environmental controls: home humidity, furnaces, filters, dust mite/pet prevention, HEPA vacuums. To discuss weather changes, air quality and the benefits of nasal washing.   Exacerbations: - Group verbal and written instruction to provide: warning signs, infection symptoms, calling MD promptly, preventive modes, and value of vaccinations. Review: effective airway clearance, coughing and/or vibration techniques. Create an Sports administrator.   Oxygen: - Individual and group verbal and written instruction on oxygen therapy. Includes supplement oxygen, available portable oxygen systems, continuous and intermittent flow rates, oxygen safety, concentrators, and Medicare reimbursement for  oxygen.   Respiratory Medications: - Group verbal and written instruction to review medications for lung disease. Drug class, frequency, complications, importance of spacers, rinsing mouth after steroid MDI's, and proper cleaning methods for nebulizers.   AED/CPR: - Group verbal and written instruction with the use of models to demonstrate the basic use of the AED with the basic  ABC's of resuscitation.   Breathing Retraining: - Provides individuals verbal and written instruction on purpose, frequency, and proper technique of diaphragmatic breathing and pursed-lipped breathing. Applies individual practice skills. Flowsheet Row Pulmonary Rehab from 01/10/2017 in [facility] Cardiac and Pulmonary Rehab  Date  12/19/16  Educator  LB  Instruction Review Code  2- meets goals/outcomes      Anatomy and Physiology of the Lungs: - Group verbal and written instruction with the use of models to provide basic lung anatomy and physiology related to function, structure and complications of lung disease.   Heart Failure: - Group verbal and written instruction on the basics of heart failure: signs/symptoms, treatments, explanation of ejection fraction, enlarged heart and cardiomyopathy.   Sleep Apnea: - Individual verbal and written instruction to review Obstructive Sleep Apnea. Review of risk factors, methods for diagnosing and types of masks and machines for OSA.   Anxiety: - Provides group, verbal and written instruction on the correlation between heart/lung disease and anxiety, treatment options, and management of anxiety.   Relaxation: - Provides group, verbal and written instruction about the benefits of relaxation for patients with heart/lung disease. Also provides patients with examples of relaxation techniques. Flowsheet Row Pulmonary Rehab from 01/10/2017 in [facility] Cardiac and Pulmonary Rehab  Date  01/10/17  Educator  [facility]  Instruction Review Code  2- Meets goals/outcomes      Knowledge  Questionnaire Score:     Knowledge Questionnaire Score - 12/19/16 1157      Knowledge Questionnaire Score   Pre Score 7/10       Core Components/Risk Factors/Patient Goals at Admission:     Personal Goals and Risk Factors at Admission - 12/19/16 1201      Core Components/Risk Factors/Patient Goals on Admission    Weight Management Yes;Weight Loss   Intervention Weight Management: Develop a combined nutrition and exercise program designed to reach desired caloric intake, while maintaining appropriate intake of nutrient and fiber, sodium and fats, and appropriate energy expenditure required for the weight goal.;Weight Management: Provide education and appropriate resources to help participant work on and attain dietary goals.;Weight Management/Obesity: Establish reasonable short term and long term weight goals.;Obesity: Provide education and appropriate resources to help participant work on and attain dietary goals.   Admit Weight 242 lb 12.8 oz (110.1 kg)   Goal Weight: Short Term 237 lb (107.5 kg)   Goal Weight: Long Term 190 lb (86.2 kg)   Expected Outcomes Short Term: Continue to assess and modify interventions until short term weight is achieved;Long Term: Adherence to nutrition and physical activity/exercise program aimed toward attainment of established weight goal;Weight Maintenance: Understanding of the daily nutrition guidelines, which includes 25-35% calories from fat, 7% or less cal from saturated fats, less than 264m cholesterol, less than 1.5gm of sodium, & 5 or more servings of fruits and vegetables daily;Weight Loss: Understanding of general recommendations for a balanced deficit meal plan, which promotes 1-2 lb weight loss per week and includes a negative energy balance of [phone] kcal/d;Understanding recommendations for meals to include 15-35% energy as protein, 25-35% energy from fat, 35-60% energy from carbohydrates, less than 2060mof dietary cholesterol, 20-35 gm of total  fiber daily;Understanding of distribution of calorie intake throughout the day with the consumption of 4-5 meals/snacks   Sedentary Yes  Particip[facility] [employer] [facility] [employer], support and counseling about physical activity/exercise needs.;Develop an individualized exercise prescription for aerobic and resistive training based on initial evaluation findings, risk stratification,  comorbidities and participant's personal goals.   Expected Outcomes Achievement of increased cardiorespiratory fitness and enhanced flexibility, muscular endurance and strength shown through measurements of functional capacity and personal statement of participant.   Increase Strength and Stamina Yes   Intervention Provide advice, education, support and counseling about physical activity/exercise needs.;Develop an individualized exercise prescription for aerobic and resistive training based on initial evaluation findings, risk stratification, comorbidities and participant's personal goals.   Expected Outcomes Achievement of increased cardiorespiratory fitness and enhanced flexibility, muscular endurance and strength shown through measurements of functional capacity and personal statement of participant.   Improve shortness of breath with ADL's Yes   Intervention Provide education, individualized exercise plan and daily activity instruction to help decrease symptoms of SOB with activities of daily living.   Expected Outcomes Short Term: Achieves a reduction of symptoms when performing activities of daily living.   Develop more efficient breathing techniques such as purse lipped breathing and diaphragmatic breathing; and practicing self-pacing with activity Yes   Intervention Provide education, demonstration and support about specific breathing techniuqes utilized for more efficient breathing. Include techniques such as pursed lipped breathing, diaphragmatic breathing and self-pacing  activity.   Expected Outcomes Short Term: Participant will be able to demonstrate and use breathing techniques as needed throughout daily activities.   Increase knowledge of respiratory medications and ability to use respiratory devices properly  Yes  CPAP at [place] education and demonstration as needed of appropriate use of medications, inhalers, and oxygen therapy.   Expected Outcomes Short Term: Achieves understanding of medications use. Understands that oxygen is a medication prescribed by physician. Demonstrates appropriate use of inhaler and oxygen therapy.   Diabetes Yes   Intervention Provide education about signs/symptoms and action to take for hypo/hyperglycemia.;Provide education about proper nutrition, including hydration, and aerobic/resistive exercise prescription along with prescribed medications to achieve blood glucose in normal ranges: Fasting glucose 65-99 mg/dL   Expected Outcomes Short Term: Participant verbalizes understanding of the signs/symptoms and immediate care of hyper/hypoglycemia, proper foot care and importance of medication, aerobic/resistive exercise and nutrition plan for blood glucose control.;Long Term: Attainment of HbA1C < 7%.   Heart Failure Yes   Intervention Provide a combined exercise and nutrition program that is supplemented with education, support and counseling about heart failure. Directed toward relieving symptoms such as shortness of breath, decreased exercise tolerance, and extremity edema.   Expected Outcomes Improve functional capacity of life;Short term: Attendance in program 2-3 days a week with increased exercise capacity. Reported lower sodium intake. Reported increased fruit and vegetable intake. Reports medication compliance.;Short term: Daily weights obtained and reported for increase. Utilizing diuretic protocols set by physician.;Long term: Adoption of self-care skills and reduction of barriers for early signs and  symptoms recognition and intervention leading to self-care maintenance.   Hypertension Yes   Intervention Provide education on lifestyle modifcations including regular physical activity/exercise, weight management, moderate sodium restriction and increased consumption of fresh fruit, vegetables, and low fat dairy, alcohol moderation, and smoking cessation.;Monitor prescription use compliance.   Expected Outcomes Short Term: Continued assessment and intervention until BP is < 140/76m HG in hypertensive participants. < 130/819mHG in hypertensive participants with diabetes, heart failure or chronic kidney disease.;Long Term: Maintenance of blood pressure at goal levels.   Lipids Yes   Intervention Provide education and support for participant on nutrition & aerobic/resistive exercise along with prescribed medications to achieve LDL <7062mHDL >45m71m Expected Outcomes Short Term: Participant states understanding of desired cholesterol  values and is compliant with medications prescribed. Participant is following exercise prescription and nutrition guidelines.;Long Term: Cholesterol controlled with medications as prescribed, with individualized exercise RX and with personalized nutrition plan. Value goals: LDL < 60m, HDL > 40 mg.      Core Components/Risk Factors/Patient Goals Review:      Goals and Risk Factor Review    Row Name 01/01/17 1058             Core Components/Risk Factors/Patient Goals Review   Personal Goals Review Weight Management/Obesity;Increase Strength and Stamina;Hypertension;Lipids;Diabetes;Improve shortness of breath with ADL's       Review The following goals were discussed with the patient. MEmalynnstated that her last A1C was 6.8 and all the rest of her blood work has been in acceptable ranges. She did state that her SOB sometimes limits her activities or slows her down. She is hoping that his program will help her with that. 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 Weight loss is a major gaol and exercise guidelines for weight loss were disucssed with the patient.        Expected Outcomes MKarismawill consistantly come to class and participate in her exercise program as well as education classes. Using what she learns and being consistant with her attendance will help her see progress in the areas mentioned above.           Core Components/Risk Factors/Patient Goals at Discharge (Final Review):      Goals and Risk Factor Review - 01/01/17 1058      Core Components/Risk Factors/Patient Goals Review   Personal Goals Review Weight Management/Obesity;Increase Strength and Stamina;Hypertension;Lipids;Diabetes;Improve shortness of breath with ADL's   Review The following goals were discussed with the patient. MTolastated that her last A1C was 6.8 and all the rest of her blood work has been in acceptable ranges. She did state that her SOB sometimes limits her activities or slows her down. She is hoping that his program will help her with that. Before starting [facility] she was going to a S[employer] of Staffclass 3 days a week at [employer] but stated that she does eventually want to work towards using the machines at [employer]. Weight loss is a major gaol and exercise guidelines for weight loss were disucssed with the patient.    Expected Outcomes MMendiwill consistantly come to class and participate in her exercise program as well as education classes. Using what she learns and being consistant with her attendance will help her see progress in the areas mentioned above.       ITP Comments:   Comments: 30 Day Note Review

## 2017-01-15 NOTE — Progress Notes (Signed)
Daily Session Note  Patient Details  Name: [name] [name] MRN: [id number] Date of Birth: [birth year] Referring Provider:   Flowsheet Row Pulmonary Rehab from 12/19/2016 in [facility] Cardiac and Pulmonary Rehab  Referring Provider  Gollan      Encounter Date: 01/15/2017  Check In:     Session Check In - 01/15/17 1113      Check-In   Location [facility]-Cardiac & Pulmonary Rehab   Staff Present Nyoka Cowden, RN, BSN, Kela Millin, BA, ACSM CEP, Exercise Physiologist;Laureen Janell Quiet, RRT, Respiratory Therapist   Supervising physician immediately available to respond to emergencies [facility] immediately available ER MD   Physician(s) Legrand Rams and Mariea Clonts   Medication changes reported     No   Fall or balance concerns reported    No   Warm-up and Cool-down Performed as group-led Location manager Performed Yes   VAD Patient? No     Pain Assessment   Currently in Pain? No/denies         Goals Met:  Proper associated with RPD/PD & O2 Sat Independence with exercise equipment Exercise tolerated well Strength training completed today  Goals Unmet:  Not Applicable  Comments: Pt able to follow exercise prescription today without complaint.  Will continue to monitor for progression.    Dr. Emily Filbert is Medical Director for [place] and [facility].

## 2017-01-15 NOTE — Progress Notes (Signed)
Complaint:  Visit Type: Patient returns to my office for continued preventative foot care services. Complaint: Patient states" my nails have grown long and thick and become painful to walk and wear shoes" Patient has been diagnosed with DM with no foot complications. The patient presents for preventative foot care services. No changes to ROS  Podiatric Exam: Vascular: dorsalis pedis and posterior tibial pulses are palpable bilateral. Capillary return is immediate. Temperature gradient is WNL. Skin turgor WNL  Sensorium: Diminished Semmes Weinstein monofilament test. Normal tactile sensation bilaterally. Nail Exam: Pt has thick disfigured discolored nails with subungual debris noted bilateral entire nail hallux through fifth toenails Ulcer Exam: There is no evidence of ulcer or pre-ulcerative changes or infection. Orthopedic Exam: Muscle tone and strength are WNL. No limitations in general ROM. No crepitus or effusions noted. Foot type and digits show no abnormalities. Bony prominences are unremarkable. Skin:  Porokeratosis sub 4 left sub 1 left and sub 4 right No infection or ulcers  Diagnosis:  Onychomycosis, , Pain in right toe, pain in left toes, Porokeratosis  B/L  Treatment & Plan Procedures and Treatment: Consent by patient was obtained for treatment procedures. The patient understood the discussion of treatment and procedures well. All questions were answered thoroughly reviewed. Debridement of mycotic and hypertrophic toenails, 1 through 5 bilateral and clearing of subungual debris. No ulceration, no infection noted.  Return Visit-Office Procedure: Patient instructed to return to the office for a follow up visit 3 months for continued evaluation and treatment.    Gardiner Barefoot DPM

## 2017-01-16 ENCOUNTER — Ambulatory Visit: Payer: Medicare Other | Admitting: *Deleted

## 2017-01-16 ENCOUNTER — Telehealth: Payer: Self-pay | Admitting: Cardiology

## 2017-01-16 NOTE — Progress Notes (Signed)
Remote pacemaker transmission.   

## 2017-01-16 NOTE — Telephone Encounter (Signed)
LMOVM reminding pt to send remote transmission.   

## 2017-01-17 DIAGNOSIS — R0602 Shortness of breath: Secondary | ICD-10-CM | POA: Diagnosis not present

## 2017-01-17 DIAGNOSIS — I5032 Chronic diastolic (congestive) heart failure: Secondary | ICD-10-CM | POA: Diagnosis not present

## 2017-01-17 NOTE — Progress Notes (Signed)
Daily Session Note  Patient Details  Name: [name] [name] MRN: [id number] Date of Birth: 21-Mar-1935 Referring Provider:   Flowsheet Row Pulmonary Rehab from 12/19/2016 in [facility] Cardiac and Pulmonary Rehab  Referring Provider  Gollan      Encounter Date: 01/17/2017  Check In:     Session Check In - 01/17/17 1146      Check-In   Location [facility]-Cardiac & Pulmonary Rehab   Staff Present Carson Myrtle, BS, RRT, Respiratory Lennie Hummer, MA, ACSM RCEP, Exercise Physiologist;Amanda Oletta Darter, BA, ACSM CEP, Exercise Physiologist   Supervising physician immediately available to respond to emergencies [facility] immediately available ER MD   Physician(s) Cinda Quest and McShane   Medication changes reported     No   Fall or balance concerns reported    No   Warm-up and Cool-down Performed as group-led instruction   Resistance Training Performed Yes   VAD Patient? No     Pain Assessment   Currently in Pain? No/denies         Goals Met:  Proper associated with RPD/PD & O2 Sat Independence with exercise equipment Exercise tolerated well Strength training completed today  Goals Unmet:  Not Applicable  Comments: Pt able to follow exercise prescription today without complaint.  Will continue to monitor for progression.    Dr. Emily Filbert is Medical Director for [place] and [facility].

## 2017-01-19 DIAGNOSIS — R0602 Shortness of breath: Secondary | ICD-10-CM | POA: Diagnosis not present

## 2017-01-19 DIAGNOSIS — I5032 Chronic diastolic (congestive) heart failure: Secondary | ICD-10-CM | POA: Diagnosis not present

## 2017-01-19 NOTE — Progress Notes (Signed)
Daily Session Note  Patient Details  Name: [name] [name] MRN: [id number] Date of Birth: 1935/04/07 Referring Provider:   Flowsheet Row Pulmonary Rehab from 12/19/2016 in [facility] Cardiac and Pulmonary Rehab  Referring Provider  Gollan      Encounter Date: 01/19/2017  Check In:     Session Check In - 01/19/17 1139      Check-In   Location [facility]-Cardiac & Pulmonary Rehab   Staff Present Gerlene Burdock, RN, Vickki Hearing, BA, ACSM CEP, Exercise Physiologist;Other  Darel Hong, RN   Supervising physician immediately available to respond to emergencies [facility] immediately available ER MD   Physician(s) Darl Householder and Rifenbark   Medication changes reported     No   Fall or balance concerns reported    No   Warm-up and Cool-down Performed as group-led instruction   Resistance Training Performed Yes   VAD Patient? No     Pain Assessment   Currently in Pain? No/denies         Goals Met:  Proper associated with RPD/PD & O2 Sat Independence with exercise equipment Exercise tolerated well Strength training completed today  Goals Unmet:  Not Applicable  Comments: Pt able to follow exercise prescription today without complaint.  Will continue to monitor for progression.    Dr. Emily Filbert is Medical Director for [place] and [facility].

## 2017-01-22 ENCOUNTER — Encounter: Payer: Medicare Other | Admitting: *Deleted

## 2017-01-22 DIAGNOSIS — R0602 Shortness of breath: Secondary | ICD-10-CM | POA: Diagnosis not present

## 2017-01-22 DIAGNOSIS — I5032 Chronic diastolic (congestive) heart failure: Secondary | ICD-10-CM

## 2017-01-22 NOTE — Progress Notes (Signed)
Daily Session Note  Patient Details  Name: [name] [name] MRN: [id number] Date of Birth: 10/21/1935 Referring Provider:   Flowsheet Row Pulmonary Rehab from 12/19/2016 in [facility] Cardiac and Pulmonary Rehab  Referring Provider  Gollan      Encounter Date: 01/22/2017  Check In:     Session Check In - 01/22/17 1013      Check-In   Location [facility]-Cardiac & Pulmonary Rehab   Staff Present Earlean Shawl, BS, ACSM CEP, Exercise Physiologist;Laureen Owens Shark, BS, RRT, Respiratory Dareen Piano, BA, ACSM CEP, Exercise Physiologist   Supervising physician immediately available to respond to emergencies [facility] immediately available ER MD   Physician(s) Alfred Levins and Corky Downs   Medication changes reported     No   Fall or balance concerns reported    No   Warm-up and Cool-down Performed on first and last piece of equipment   Resistance Training Performed Yes   VAD Patient? No     Pain Assessment   Currently in Pain? No/denies   Multiple Pain Sites No         Goals Met:  Proper associated with RPD/PD & O2 Sat Independence with exercise equipment Exercise tolerated well Strength training completed today  Goals Unmet:  Not Applicable  Comments: Pt able to follow exercise prescription today without complaint.  Will continue to monitor for progression.    Dr. Emily Filbert is Medical Director for [place] and [facility].

## 2017-01-24 DIAGNOSIS — I5032 Chronic diastolic (congestive) heart failure: Secondary | ICD-10-CM

## 2017-01-24 DIAGNOSIS — R0602 Shortness of breath: Secondary | ICD-10-CM | POA: Diagnosis not present

## 2017-01-24 NOTE — Progress Notes (Signed)
Daily Session Note  Patient Details  Name: [name] [name] MRN: [id number] Date of Birth: [birth year] Referring Provider:   Flowsheet Row Pulmonary Rehab from 12/19/2016 in [facility] Cardiac and Pulmonary Rehab  Referring Provider  Gollan      Encounter Date: 01/24/2017  Check In:     Session Check In - 01/24/17 1237      Check-In   Location [facility]-Cardiac & Pulmonary Rehab   Staff Present Alberteen Sam, MA, ACSM RCEP, Exercise Physiologist;Giulliana Mcroberts Oletta Darter, BA, ACSM CEP, Exercise Physiologist;Laureen Janell Quiet, RRT, Respiratory Therapist   Supervising physician immediately available to respond to emergencies [facility] immediately available ER MD   Physician(s) Quentin Cornwall and Jimmye Norman   Medication changes reported     No   Fall or balance concerns reported    No   Warm-up and Cool-down Performed as group-led Location manager Performed Yes   VAD Patient? No     Pain Assessment   Currently in Pain? No/denies         Goals Met:  Proper associated with RPD/PD & O2 Sat Independence with exercise equipment Exercise tolerated well Strength training completed today  Goals Unmet:  Not Applicable  Comments: Pt able to follow exercise prescription today without complaint.  Will continue to monitor for progression.    Dr. Emily Filbert is Medical Director for [place] and [facility].

## 2017-01-26 ENCOUNTER — Other Ambulatory Visit: Payer: Self-pay | Admitting: Family Medicine

## 2017-01-26 ENCOUNTER — Encounter: Payer: Medicare Other | Admitting: *Deleted

## 2017-01-26 DIAGNOSIS — I5032 Chronic diastolic (congestive) heart failure: Secondary | ICD-10-CM | POA: Diagnosis not present

## 2017-01-26 DIAGNOSIS — R0602 Shortness of breath: Secondary | ICD-10-CM | POA: Diagnosis not present

## 2017-01-26 NOTE — Progress Notes (Signed)
Daily Session Note  Patient Details  Name: SHABNAM LADD MRN: [id number] Date of Birth: Nov 18, 1935 Referring Provider:   Flowsheet Row Pulmonary Rehab from 12/19/2016 in [facility] Cardiac and Pulmonary Rehab  Referring Provider  Gollan      Encounter Date: 01/26/2017  Check In:     Session Check In - 01/26/17 1039      Check-In   Location [facility]-Cardiac & Pulmonary Rehab   Staff Present Gerlene Burdock, RN, Vickki Hearing, BA, ACSM CEP, Exercise Physiologist   Supervising physician immediately available to respond to emergencies [facility] immediately available ER MD   Physician(s) Dr. Joni Fears and Dr. Marcelene Butte   Medication changes reported     No   Fall or balance concerns reported    No   Warm-up and Cool-down Performed as group-led instruction   Resistance Training Performed Yes   VAD Patient? No     Pain Assessment   Currently in Pain? No/denies         Goals Met:  Proper associated with RPD/PD & O2 Sat Exercise tolerated well  Goals Unmet:  Not Applicable  Comments:     Dr. Emily Filbert is Medical Director for [place] and [facility].

## 2017-01-27 ENCOUNTER — Other Ambulatory Visit: Payer: Self-pay | Admitting: Family Medicine

## 2017-01-27 DIAGNOSIS — E119 Type 2 diabetes mellitus without complications: Secondary | ICD-10-CM

## 2017-01-29 DIAGNOSIS — L601 Onycholysis: Secondary | ICD-10-CM | POA: Diagnosis not present

## 2017-01-29 DIAGNOSIS — B351 Tinea unguium: Secondary | ICD-10-CM | POA: Diagnosis not present

## 2017-01-29 DIAGNOSIS — B965 Pseudomonas (aeruginosa) (mallei) (pseudomallei) as the cause of diseases classified elsewhere: Secondary | ICD-10-CM | POA: Diagnosis not present

## 2017-01-31 ENCOUNTER — Ambulatory Visit: Payer: Medicare Other | Admitting: Family Medicine

## 2017-02-02 ENCOUNTER — Ambulatory Visit (INDEPENDENT_AMBULATORY_CARE_PROVIDER_SITE_OTHER): Payer: Medicare Other | Admitting: Family Medicine

## 2017-02-02 ENCOUNTER — Encounter: Payer: Self-pay | Admitting: Family Medicine

## 2017-02-02 ENCOUNTER — Encounter: Payer: Medicare Other | Attending: Cardiovascular Disease

## 2017-02-02 VITALS — BP 124/72 | HR 83 | Temp 98.1°F | Resp 16 | Ht 65.0 in | Wt 245.4 lb

## 2017-02-02 DIAGNOSIS — E119 Type 2 diabetes mellitus without complications: Secondary | ICD-10-CM

## 2017-02-02 DIAGNOSIS — E7800 Pure hypercholesterolemia, unspecified: Secondary | ICD-10-CM | POA: Diagnosis not present

## 2017-02-02 DIAGNOSIS — F419 Anxiety disorder, unspecified: Secondary | ICD-10-CM

## 2017-02-02 DIAGNOSIS — I5032 Chronic diastolic (congestive) heart failure: Secondary | ICD-10-CM | POA: Diagnosis not present

## 2017-02-02 DIAGNOSIS — L989 Disorder of the skin and subcutaneous tissue, unspecified: Secondary | ICD-10-CM | POA: Insufficient documentation

## 2017-02-02 DIAGNOSIS — R0602 Shortness of breath: Secondary | ICD-10-CM | POA: Insufficient documentation

## 2017-02-02 DIAGNOSIS — Z781 Physical restraint status: Secondary | ICD-10-CM | POA: Insufficient documentation

## 2017-02-02 LAB — GLUCOSE, POCT (MANUAL RESULT ENTRY): POC Glucose: 96 mg/dl (ref 70–99)

## 2017-02-02 LAB — POCT GLYCOSYLATED HEMOGLOBIN (HGB A1C): Hemoglobin A1C: 6.7

## 2017-02-02 MED ORDER — ATORVASTATIN CALCIUM 20 MG PO TABS: ORAL_TABLET | ORAL | 0 refills | Status: DC

## 2017-02-02 MED ORDER — ALPRAZOLAM 0.5 MG PO TABS: 0.5000 mg | ORAL_TABLET | Freq: Every day | ORAL | 0 refills | Status: DC | PRN

## 2017-02-02 MED ORDER — FUROSEMIDE 20 MG PO TABS: 20.0000 mg | ORAL_TABLET | Freq: Every day | ORAL | 0 refills | Status: DC

## 2017-02-02 MED ORDER — METFORMIN HCL ER 500 MG PO TB24: ORAL_TABLET | ORAL | 0 refills | Status: DC

## 2017-02-02 MED ORDER — MOMETASONE FUROATE 0.1 % EX CREA: TOPICAL_CREAM | Freq: Every day | CUTANEOUS | 0 refills | Status: DC

## 2017-02-02 NOTE — Progress Notes (Signed)
Name: [name] [name]   MRN: [id number]    DOB: Aug 15, 1935   Date:02/02/2017       Progress Note  Subjective  Chief Complaint  Chief Complaint  Patient presents with  . Diabetes  . Hypertension    Diabetes  She presents for her follow-up diabetic visit. She has type 2 diabetes mellitus. Her disease course has been stable. Pertinent negatives for hypoglycemia include no headaches or nervousness/anxiousness. Pertinent negatives for diabetes include no blurred vision, no chest pain, no fatigue, no foot paresthesias, no polydipsia and no polyuria. Pertinent negatives for diabetic complications include no CVA or heart disease. Risk factors for coronary artery disease include dyslipidemia and diabetes mellitus. Current diabetic treatment includes oral agent (triple therapy). Her weight is stable. Her breakfast blood glucose range is generally 90-110 mg/dl.  Hypertension  This is a chronic problem. The problem is unchanged. Associated symptoms include anxiety. Pertinent negatives include no blurred vision, chest pain, headaches, palpitations or shortness of breath. Past treatments include beta blockers, calcium channel blockers and diuretics. There is no history of CVA.  Hyperlipidemia  This is a chronic problem. The problem is controlled. Recent lipid tests were reviewed and are normal. Pertinent negatives include no chest pain, leg pain, myalgias or shortness of breath.  Anxiety  Presents for follow-up visit. Symptoms include insomnia. Patient reports no chest pain, excessive worry, irritability, nervous/anxious behavior, palpitations, panic or shortness of breath. The severity of symptoms is moderate.       Past Medical History:  Diagnosis Date  . 1st degree AV block    a. PR 400 msec with Apacing  . Anemia   . Anxiety   . Arthritis    a. shoulders.  . Chest pain    a. 2004 Cath:  reportedly nl;  b. 09/2013 Neg MV.  . Chronic diastolic heart failure ([place])    a. 09/2013 Echo: EF 50-55%,  mild LVH, mild to mod MR/TR.  Marland Kitchen Esophageal reflux   . H/O hiatal hernia   . History of UTI   . Hyperlipidemia   . Morbid obesity ([place])   . OSA on CPAP   . PAF (paroxysmal atrial fibrillation) ([place])   . SSS (sick sinus syndrome) ([place])    a. 2008 s/p MDT PPM;  b. 11/2013 Gen change- MDT ADDRL1 Adapta DC PPM, ser # [id number] H.  . Symptomatic PVCs   . Syncope and collapse    a. Felt to be vasovagal.  . Type II diabetes mellitus ([place])   . Unspecified essential hypertension   . Unspecified glaucoma(365.9)     Past Surgical History:  Procedure Laterality Date  . DILATION AND CURETTAGE OF UTERUS    . INCISION AND DRAINAGE OF WOUND Right 03/1998   "leg; it was like a boil" (12/01/2013)  . INSERT / REPLACE / REMOVE PACEMAKER  2008; 12/01/2013   MDT ADDRL1 pacemaker - gen change by Dr Caryl Comes 12/01/2013  . LUNG BIOPSY  2010   [facility]  . PERMANENT PACEMAKER GENERATOR CHANGE N/A 12/01/2013   Procedure: PERMANENT PACEMAKER GENERATOR CHANGE;  Surgeon: Deboraha Sprang, MD;  Location: [facility] CATH LAB;  Service: Cardiovascular;  Laterality: N/A;  . VAGINAL HYSTERECTOMY  1970    Family History  Problem Relation Age of Onset  . Heart disease Father   . Rheum arthritis Father   . Allergies Father   . Liver cancer Mother   . Pancreatic cancer Mother     Social History   Social History  . Marital status: Widowed  Spouse name: N/A  . Number of children: Y  . Years of education: N/A   Occupational History  . retired    Social History Main Topics  . Smoking status: Former Smoker    Packs/day: 0.50    Years: 34.00    Types: Cigarettes    Quit date: 08/15/1989  . Smokeless tobacco: Never Used  . Alcohol use No  . Drug use: No  . Sexual activity: No   Other Topics Concern  . Not on file   Social History Narrative   Retired. Widowed. Regularly exercises.      Current Outpatient Prescriptions:  .  acetaminophen (TYLENOL) 500 MG tablet, Take 500 mg by mouth every 6 (six) hours as  needed., Disp: , Rfl:  .  ALPRAZolam (XANAX) 0.5 MG tablet, Take 1 tablet (0.5 mg total) by mouth daily as needed., Disp: 90 tablet, Rfl: 0 .  amLODipine (NORVASC) 10 MG tablet, TAKE 1 TABLET (10 MG TOTAL) BY MOUTH DAILY., Disp: 90 tablet, Rfl: 0 .  atorvastatin (LIPITOR) 20 MG tablet, TAKE 1 TABLET (20 MG TOTAL) BY MOUTH DAILY., Disp: 90 tablet, Rfl: 0 .  Brinzolamide-Brimonidine (SIMBRINZA) 1-0.2 % SUSP, Place 1 drop into both eyes 2 (two) times daily., Disp: 1 Bottle, Rfl: 1 .  carvedilol (COREG) 12.5 MG tablet, Take 1 tablet (12.5 mg) in the am and 1 and 1/2 tablet in the evening., Disp: , Rfl:  .  carvedilol (COREG) 12.5 MG tablet, Take 1 tablet in the morning with breakfast and 2 tablets pm daily by mouth in the evening before bed., Disp: 90 tablet, Rfl: 3 .  doxycycline (VIBRA-TABS) 100 MG tablet, Take 1 tablet (100 mg total) by mouth 2 (two) times daily., Disp: 14 tablet, Rfl: 0 .  esomeprazole (NEXIUM) 40 MG capsule, TAKE 1 CAPSULE (40 MG TOTAL) BY MOUTH DAILY., Disp: 90 capsule, Rfl: 1 .  flecainide (TAMBOCOR) 50 MG tablet, Take 1 tablet (50 mg total) by mouth 2 (two) times daily., Disp: 60 tablet, Rfl: 6 .  fluticasone (FLONASE) 50 MCG/ACT nasal spray, PLACE 2 SPRAYS INTO BOTH NOSTRILS DAILY., Disp: 16 g, Rfl: 2 .  furosemide (LASIX) 20 MG tablet, TAKE 1 TABLET (20 MG TOTAL) BY MOUTH DAILY., Disp: 90 tablet, Rfl: 0 .  glipiZIDE (GLUCOTROL) 10 MG tablet, TAKE 1 TABLET (10 MG TOTAL) BY MOUTH 2 (TWO) TIMES DAILY BEFORE A MEAL., Disp: 180 tablet, Rfl: 0 .  metFORMIN (GLUCOPHAGE-XR) 500 MG 24 hr tablet, TAKE 1 TABLET (500 MG TOTAL) BY MOUTH 2 (TWO) TIMES DAILY., Disp: 180 tablet, Rfl: 0 .  mometasone (ELOCON) 0.1 % cream, Apply topically daily. Apply to ear as directed two times a day for 10 days as needed, Disp: 45 g, Rfl: 0 .  Multiple Vitamins-Minerals (CENTRUM SILVER PO), Take by mouth daily., Disp: , Rfl:  .  olmesartan (BENICAR) 40 MG tablet, TAKE 1 TABLET (40 MG TOTAL) BY MOUTH DAILY.,  Disp: 90 tablet, Rfl: 0 .  TRADJENTA 5 MG TABS tablet, TAKE 1 TABLET (5 MG TOTAL) BY MOUTH DAILY., Disp: 30 tablet, Rfl: 2 .  travoprost, benzalkonium, (TRAVATAN) 0.004 % ophthalmic solution, Place 1 drop into both eyes at bedtime. , Disp: , Rfl:  .  warfarin (COUMADIN) 3 MG tablet, TAKE 1-1.5 TABLETS DAILY AT 6 PM. TAKE 1 TAB ON TUES, THURS, SAT, SUN. TAKE 4.5 MG ON MON, WED, FRI, Disp: 100 tablet, Rfl: 3  Allergies  Allergen Reactions  . Metoprolol Other (See Comments)    Bad dreams Bad dreams  Patient stated that she had suicidal thoughts while taking this medication.  . Diltiazem Other (See Comments)    Patient stated that she had bad dreams with this medication  . Levofloxacin In D5w Nausea Only and Nausea And Vomiting  . Levofloxacin Nausea Only and Nausea And Vomiting     Review of Systems  Constitutional: Negative for fatigue and irritability.  Eyes: Negative for blurred vision.  Respiratory: Negative for shortness of breath.   Cardiovascular: Negative for chest pain and palpitations.  Musculoskeletal: Negative for myalgias.  Neurological: Negative for headaches.  Endo/Heme/Allergies: Negative for polydipsia.  Psychiatric/Behavioral: The patient has insomnia. The patient is not nervous/anxious.       Objective  Vitals:   02/02/17 1139  BP: 124/72  Pulse: 83  Resp: 16  Temp: 98.1 F (36.7 C)  TempSrc: Oral  SpO2: 93%  Weight: 245 lb 6.4 oz (111.3 kg)  Height: 5\' 5"  (1.651 m)    Physical Exam  Constitutional: She is well-developed, well-nourished, and in no distress.  Cardiovascular: Normal rate, regular rhythm and normal heart sounds.   No murmur heard. Pulmonary/Chest: Effort normal and breath sounds normal. She has no wheezes.  Abdominal: Soft. Bowel sounds are normal. There is no tenderness.  Musculoskeletal:       Right ankle: She exhibits swelling. No tenderness.       Left ankle: She exhibits swelling. No tenderness.  Psychiatric: Mood, memory,  affect and judgment normal.  Nursing note and vitals reviewed.    Assessment & Plan  1. Controlled type 2 diabetes mellitus without complication, without long-term current use of insulin (HCC) Point-of-care A1c is 6.7%, well-controlled diabetes - POCT HgB A1C - POCT Glucose (CBG) - metFORMIN (GLUCOPHAGE-XR) 500 MG 24 hr tablet; TAKE 1 TABLET (500 MG TOTAL) BY MOUTH 2 (TWO) TIMES DAILY.  Dispense: 180 tablet; Refill: 0  2. Anxiety - ALPRAZolam (XANAX) 0.5 MG tablet; Take 1 tablet (0.5 mg total) by mouth daily as needed.  Dispense: 90 tablet; Refill: 0  3. Chronic diastolic heart failure (HCC) Followed by cardiology, continue on Lasix - furosemide (LASIX) 20 MG tablet; Take 1 tablet (20 mg total) by mouth daily.  Dispense: 90 tablet; Refill: 0  4. Pure hypercholesterolemia Obtain FLP in 3 months - atorvastatin (LIPITOR) 20 MG tablet; TAKE 1 TABLET (20 MG TOTAL) BY MOUTH DAILY.  Dispense: 90 tablet; Refill: 0  5. Eczematous skin lesions Has occasional lesions that respond well to topical corticosteroid, prescription provided - mometasone (ELOCON) 0.1 % cream; Apply topically daily. Apply to affected area as directed two times a day  Dispense: 45 g; Refill: 0    Marcedes Tech [facility] 02/02/2017 11:49 AM

## 2017-02-05 ENCOUNTER — Encounter: Payer: Medicare Other | Admitting: *Deleted

## 2017-02-05 DIAGNOSIS — R0602 Shortness of breath: Secondary | ICD-10-CM | POA: Diagnosis present

## 2017-02-05 DIAGNOSIS — Z781 Physical restraint status: Secondary | ICD-10-CM | POA: Diagnosis not present

## 2017-02-05 DIAGNOSIS — I5032 Chronic diastolic (congestive) heart failure: Secondary | ICD-10-CM

## 2017-02-05 NOTE — Progress Notes (Signed)
Daily Session Note  Patient Details  Name: LAGRETTA LOSEKE MRN: [id number] Date of Birth: 11-Jun-1935 Referring Provider:   Flowsheet Row Pulmonary Rehab from 12/19/2016 in [facility] Cardiac [facility]  Referring Provider  Gollan      Encounter Date: 02/05/2017  Check In:     Session Check In - 02/05/17 1123      Check-In   Location [facility]-Cardiac & Pulmonary Rehab   Staff Present Carson Myrtle, BS, RRT, Respiratory Therapist;Evelise Reine Amedeo Plenty, BS, ACSM CEP, Exercise Physiologist;Amanda Oletta Darter, BA, ACSM CEP, Exercise Physiologist   Supervising physician immediately available to respond to emergencies [facility] immediately available ER MD   Physician(s) Corky Downs and Jimmye Norman    Medication changes reported     No   Fall or balance concerns reported    No   Warm-up and Cool-down Performed as group-led instruction   Resistance Training Performed Yes   VAD Patient? No     Pain Assessment   Currently in Pain? No/denies   Multiple Pain Sites No         History  Smoking Status  . Former Smoker  . Packs/day: 0.50  . Years: 34.00  . Types: Cigarettes  . Quit date: 08/15/1989  Smokeless Tobacco  . Never Used    Goals Met:  Proper associated with RPD/PD & O2 Sat Independence with exercise equipment Exercise tolerated well Strength training completed today  Goals Unmet:  Not Applicable  Comments: Pt able to follow exercise prescription today without complaint.  Will continue to monitor for progression.    Dr. Emily Filbert is Medical Director for [place] and [facility].

## 2017-02-07 DIAGNOSIS — I5032 Chronic diastolic (congestive) heart failure: Secondary | ICD-10-CM

## 2017-02-07 NOTE — Progress Notes (Signed)
Daily Session Note  Patient Details  Name: [name] [name] MRN: [id number] Date of Birth: Mar 10, 1935 Referring Provider:   Flowsheet Row Pulmonary Rehab from 12/19/2016 in [facility] Card[facility] Pul[facility] Rehab  Referring Provider  Gollan      Encounter Date: 02/07/2017  Check In:     Session Check In - 02/07/17 1241      Check-In   Location [facility]-Cardiac & Pulmonary Rehab   Staff Present Alberteen Sam, MA, ACSM RCEP, Exercise Physiologist;Dorsel Flinn Oletta Darter, BA, ACSM CEP, Exercise Physiologist;Laureen Owens Shark, BS, RRT, Respiratory Therapist   Supervising physician immediately available to respond to emergencies [facility] immediately available ER MD   Physician(s) Corky Downs and Archie Balboa   Medication changes reported     No   Fall or balance concerns reported    No   Warm-up and Cool-down Performed as group-led Location manager Performed Yes   VAD Patient? No     Pain Assessment   Currently in Pain? No/denies         History  Smoking Status  . Former Smoker  . Packs/day: 0.50  . Years: 34.00  . Types: Cigarettes  . Quit date: 08/15/1989  Smokeless Tobacco  . Never Used    Goals Met:  Proper associated with RPD/PD & O2 Sat Independence with exercise equipment Exercise tolerated well Strength training completed today  Goals Unmet:  Not Applicable  Comments: Pt able to follow exercise prescription today without complaint.  Will continue to monitor for progression.    Dr. Emily Filbert is Medical Director for [place] and [facility].

## 2017-02-09 ENCOUNTER — Other Ambulatory Visit: Payer: Self-pay | Admitting: Family Medicine

## 2017-02-09 ENCOUNTER — Emergency Department
Admission: EM | Admit: 2017-02-09 | Discharge: 2017-02-09 | Disposition: A | Discharge status: Home / Self Care | Payer: Medicare Other | Attending: Emergency Medicine | Admitting: Emergency Medicine

## 2017-02-09 ENCOUNTER — Other Ambulatory Visit: Payer: Self-pay

## 2017-02-09 ENCOUNTER — Telehealth: Payer: Self-pay | Admitting: *Deleted

## 2017-02-09 DIAGNOSIS — R42 Dizziness and giddiness: Secondary | ICD-10-CM | POA: Diagnosis present

## 2017-02-09 DIAGNOSIS — I5042 Chronic combined systolic (congestive) and diastolic (congestive) heart failure: Secondary | ICD-10-CM | POA: Insufficient documentation

## 2017-02-09 DIAGNOSIS — I110 Hypertensive heart disease with heart failure: Secondary | ICD-10-CM | POA: Insufficient documentation

## 2017-02-09 DIAGNOSIS — R002 Palpitations: Secondary | ICD-10-CM | POA: Insufficient documentation

## 2017-02-09 DIAGNOSIS — E119 Type 2 diabetes mellitus without complications: Secondary | ICD-10-CM | POA: Insufficient documentation

## 2017-02-09 DIAGNOSIS — Z79899 Other long term (current) drug therapy: Secondary | ICD-10-CM | POA: Insufficient documentation

## 2017-02-09 DIAGNOSIS — H9190 Unspecified hearing loss, unspecified ear: Secondary | ICD-10-CM | POA: Insufficient documentation

## 2017-02-09 DIAGNOSIS — Z7901 Long term (current) use of anticoagulants: Secondary | ICD-10-CM | POA: Insufficient documentation

## 2017-02-09 DIAGNOSIS — Z87891 Personal history of nicotine dependence: Secondary | ICD-10-CM | POA: Insufficient documentation

## 2017-02-09 DIAGNOSIS — Z7984 Long term (current) use of oral hypoglycemic drugs: Secondary | ICD-10-CM | POA: Insufficient documentation

## 2017-02-09 DIAGNOSIS — I5032 Chronic diastolic (congestive) heart failure: Secondary | ICD-10-CM

## 2017-02-09 LAB — COMPREHENSIVE METABOLIC PANEL
ALT: 20 U/L (ref 14–54)
AST: 22 U/L (ref 15–41)
Albumin: 3.7 g/dL (ref 3.5–5.0)
Alkaline Phosphatase: 76 U/L (ref 38–126)
Anion gap: 7 (ref 5–15)
BUN: 19 mg/dL (ref 6–20)
CO2: 27 mmol/L (ref 22–32)
Calcium: 9.2 mg/dL (ref 8.9–10.3)
Chloride: 106 mmol/L (ref 101–111)
Creatinine, Ser: 1.46 mg/dL — ABNORMAL HIGH (ref 0.44–1.00)
GFR calc Af Amer: 38 mL/min — ABNORMAL LOW (ref >60)
GFR calc non Af Amer: 33 mL/min — ABNORMAL LOW (ref >60)
Glucose, Bld: 63 mg/dL — ABNORMAL LOW (ref 65–99)
Potassium: 3.5 mmol/L (ref 3.5–5.1)
Sodium: 140 mmol/L (ref 135–145)
Total Bilirubin: 0.3 mg/dL (ref 0.3–1.2)
Total Protein: 7.2 g/dL (ref 6.5–8.1)

## 2017-02-09 LAB — MAGNESIUM: Magnesium: 1.6 mg/dL — ABNORMAL LOW (ref 1.7–2.4)

## 2017-02-09 LAB — TROPONIN I
Troponin I: <0.03 ng/mL (ref <0.03)
Troponin I: <0.03 ng/mL (ref <0.03)

## 2017-02-09 LAB — CBC
HCT: 29.7 % — ABNORMAL LOW (ref 35.0–47.0)
Hemoglobin: 9.6 g/dL — ABNORMAL LOW (ref 12.0–16.0)
MCH: 21.7 pg — ABNORMAL LOW (ref 26.0–34.0)
MCHC: 32.5 g/dL (ref 32.0–36.0)
MCV: 66.8 fL — ABNORMAL LOW (ref 80.0–100.0)
Platelets: 242 10*3/uL (ref 150–440)
RBC: 4.44 MIL/uL (ref 3.80–5.20)
RDW: 18.3 % — ABNORMAL HIGH (ref 11.5–14.5)
WBC: 9.3 10*3/uL (ref 3.6–11.0)

## 2017-02-09 LAB — PROTIME-INR
INR: 1.85
Prothrombin Time: 21.6 seconds — ABNORMAL HIGH (ref 11.4–15.2)

## 2017-02-09 MED ORDER — SODIUM CHLORIDE 0.9 % IV BOLUS (SEPSIS)
500.0000 mL | Freq: Once | INTRAVENOUS | Status: AC
  Administered 2017-02-09: 500 mL via INTRAVENOUS

## 2017-02-09 MED ORDER — MAGNESIUM SULFATE 2 GM/50ML IV SOLN
2.0000 g | Freq: Once | INTRAVENOUS | Status: AC
  Administered 2017-02-09: 2 g via INTRAVENOUS
  Filled 2017-02-09: qty 50

## 2017-02-09 NOTE — ED Notes (Signed)
[employer] report received and given to Dr. Alfred Levins.

## 2017-02-09 NOTE — ED Triage Notes (Addendum)
Pt with right earache and states sounds are muffled. States hx of the same and was told to got to ENT but has not seen one. Pt also co irregular heart rate hx of afib, states feels different today.

## 2017-02-09 NOTE — Telephone Encounter (Signed)
Fletcher called this morning to tell us she had been in the ED for low magnesium and dehydration.    She received magnesium and IV fluids. She has a call into her PMD for follow up of the ED visit. She was advised that we will need clearance from her PMD to return to the program.  She stated she will ask for  Clearance.

## 2017-02-09 NOTE — ED Provider Notes (Signed)
[facility] Emergency Department Provider Note  ____________________________________________  Time seen: Approximately 6:09 AM  I have reviewed the triage vital signs and the nursing notes.   HISTORY  Chief Complaint Otalgia and Irregular Heart Beat   HPI [name] [name] is a 82 y.o. female with a history of sick sinus syndrome status post pacemaker, CHF, hyperlipidemia, anemia, diabetes, hypertension who presents for evaluation of hearing problems. Patient reports that she has had a pounding sensation and ringing in her ears for 2-3 months. She has also had decreased hearing mostly on the right ear. She saw her primary care doctor and her cardiologist for these complaints. She had carotid dopplers which were normal and was referred to ENT. She has not had a chance to see ENT yet. She reports today that the pounding in her ear was worse. This is always associated with palpitations and today she has had more pronounced palpitations for most of the the day. She reports that she took her xanax and carvedilol and still was unable to sleep due to the pounding in her ears and palpitations. She reports that her palpitations have now resolved. She denies chest pain or shortness of breath, dizziness, facial droop, slurred speech, unilateral weakness or numbness. She also reports intermittent vertigo associated with these episodes  Past Medical History:  Diagnosis Date  . 1st degree AV block    a. PR 400 msec with Apacing  . Anemia   . Anxiety   . Arthritis    a. shoulders.  . Chest pain    a. 2004 Cath:  reportedly nl;  b. 09/2013 Neg MV.  . Chronic diastolic heart failure ([place])    a. 09/2013 Echo: EF 50-55%, mild LVH, mild to mod MR/TR.  Marland Kitchen Esophageal reflux   . H/O hiatal hernia   . History of UTI   . Hyperlipidemia   . Morbid obesity ([place])   . OSA on CPAP   . PAF (paroxysmal atrial fibrillation) ([place])   . SSS (sick sinus syndrome) ([place])    a. 2008 s/p MDT PPM;   b. 11/2013 Gen change- MDT ADDRL1 Adapta DC PPM, ser # [id number] H.  . Symptomatic PVCs   . Syncope and collapse    a. Felt to be vasovagal.  . Type II diabetes mellitus ([place])   . Unspecified essential hypertension   . Unspecified glaucoma(365.9)     Patient Active Problem List   Diagnosis Date Noted  . Eczematous skin lesions 02/02/2017  . URI with cough and congestion 12/13/2016  . Colicky RLQ abdominal pain 11/01/2016  . 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 Anxiety 07/28/2016  . Acute maxillary sinusitis 03/23/2016  . Pain of right hip joint 03/23/2016  . Chronic gout of right foot 12/16/2015  . Productive cough 10/26/2015  . Infection of skin of left ear lobe 10/11/2015  . Acute non-recurrent maxillary sinusitis 09/17/2015  . Allergic rhinitis 09/14/2015  . Hyperlipidemia 09/14/2015  . Gout attack 07/21/2015  . Anticoagulation monitoring, INR range 2-3 05/18/2015  . RLS (restless legs syndrome) 04/14/2015  . 1st degree AV block   . Chronic diastolic heart failure ([place])   . Cardiac arrhythmia 12/01/2013  . Hemoptysis 11/24/2013  . Chronic systolic heart failure ([place]) 10/24/2013  . Acute on chronic diastolic CHF (congestive heart failure) ([place]) 10/17/2013  . Episodic lightheadedness 07/16/2013  . Palpitations 07/16/2013  . Syncope 08/15/2012  . Back pain 05/30/2012  . OSA (obstructive sleep apnea) 01/08/2012  . DYSPNEA 01/28/2010  .  PVC (premature ventricular contraction) 09/13/2009  . EDEMA 08/23/2009  . Diabetes mellitus type 2, controlled, without complications ([place]) 47/82/9562  . GLAUCOMA 04/13/2009  . Essential hypertension 04/13/2009  . PAF (paroxysmal atrial fibrillation) ([place]) 04/13/2009  . SICK SINUS/ TACHY-BRADY SYNDROME 04/13/2009  . GERD 04/13/2009  . Cardiac pacemaker - Medtronic 04/13/2009    Past Surgical History:  Procedure Laterality Date  . DILATION AND CURETTAGE OF UTERUS    . INCISION AND DRAINAGE OF WOUND Right 03/1998   "leg; it was like  a boil" (12/01/2013)  . INSERT / REPLACE / REMOVE PACEMAKER  2008; 12/01/2013   MDT ADDRL1 pacemaker - gen change by Dr Caryl Comes 12/01/2013  . LUNG BIOPSY  2010   [facility]  . PERMANENT PACEMAKER GENERATOR CHANGE N/A 12/01/2013   Procedure: PERMANENT PACEMAKER GENERATOR CHANGE;  Surgeon: Deboraha Sprang, MD;  Location: [facility] CATH LAB;  Service: Cardiovascular;  Laterality: N/A;  . [place]    Prior to Admission medications   Medication Sig Start Date End Date Taking? Authorizing Provider  acetaminophen (TYLENOL) 500 MG tablet Take 500 mg by mouth every 6 (six) hours as needed.    Historical Provider, MD  ALPRAZolam Duanne Moron) 0.5 MG tablet Take 1 tablet (0.5 mg total) by mouth daily as needed. 02/02/17   Roselee Nova, MD  amLODipine (NORVASC) 10 MG tablet TAKE 1 TABLET (10 MG TOTAL) BY MOUTH DAILY. 12/20/16   Roselee Nova, MD  atorvastatin (LIPITOR) 20 MG tablet TAKE 1 TABLET (20 MG TOTAL) BY MOUTH DAILY. 02/02/17   Roselee Nova, MD  Brinzolamide-Brimonidine [facility]) 1-0.2 % SUSP Place 1 drop into both eyes 2 (two) times daily. 07/05/15   Roselee Nova, MD  carvedilol (COREG) 12.5 MG tablet Take 1 tablet (12.5 mg) in the am and 1 and 1/2 tablet in the evening.    Historical Provider, MD  carvedilol (COREG) 12.5 MG tablet Take 1 tablet in the morning with breakfast and 2 tablets pm daily by mouth in the evening before bed. 01/08/17   Minna Merritts, MD  doxycycline (VIBRA-TABS) 100 MG tablet Take 1 tablet (100 mg total) by mouth 2 (two) times daily. 12/19/16   Roselee Nova, MD  esomeprazole (NEXIUM) 40 MG capsule TAKE 1 CAPSULE (40 MG TOTAL) BY MOUTH DAILY. 11/16/16   Roselee Nova, MD  flecainide (TAMBOCOR) 50 MG tablet Take 1 tablet (50 mg total) by mouth 2 (two) times daily. 11/17/16   Minna Merritts, MD  fluticasone (FLONASE) 50 MCG/ACT nasal spray PLACE 2 SPRAYS INTO BOTH NOSTRILS DAILY. 01/15/17   Roselee Nova, MD  furosemide (LASIX) 20 MG tablet Take 1 tablet (20  mg total) by mouth daily. 02/02/17   Roselee Nova, MD  glipiZIDE (GLUCOTROL) 10 MG tablet TAKE 1 TABLET (10 MG TOTAL) BY MOUTH 2 (TWO) TIMES DAILY BEFORE A MEAL. 01/29/17   Roselee Nova, MD  metFORMIN (GLUCOPHAGE-XR) 500 MG 24 hr tablet TAKE 1 TABLET (500 MG TOTAL) BY MOUTH 2 (TWO) TIMES DAILY. 02/02/17   Roselee Nova, MD  mometasone (ELOCON) 0.1 % cream Apply topically daily. Apply to affected area as directed two times a day 02/02/17   Roselee Nova, MD  Multiple Vitamins-Minerals (CENTRUM SILVER PO) Take by mouth daily.    Historical Provider, MD  olmesartan (BENICAR) 40 MG tablet TAKE 1 TABLET (40 MG TOTAL) BY MOUTH DAILY. 12/19/16   Okey Dupre  Anthonette Legato, MD  TRADJENTA 5 MG TABS tablet TAKE 1 TABLET (5 MG TOTAL) BY MOUTH DAILY. 12/07/16   Roselee Nova, MD  travoprost, benzalkonium, (TRAVATAN) 0.004 % ophthalmic solution Place 1 drop into both eyes at bedtime.     Historical Provider, MD  warfarin (COUMADIN) 3 MG tablet TAKE 1-1.5 TABLETS DAILY AT 6 PM. TAKE 1 TAB ON TUES, THURS, SAT, SUN. TAKE 4.5 MG ON MON, WED, FRI 10/25/15   Roselee Nova, MD    Allergies Metoprolol; Diltiazem; Levofloxacin in d5w; and Levofloxacin  Family History  Problem Relation Age of Onset  . Heart disease Father   . Rheum arthritis Father   . Allergies Father   . Liver cancer Mother   . Pancreatic cancer Mother     Social History Social History  Substance Use Topics  . Smoking status: Former Smoker    Packs/day: 0.50    Years: 34.00    Types: Cigarettes    Quit date: 08/15/1989  . Smokeless tobacco: Never Used  . Alcohol use No    Review of Systems  Constitutional: Negative for fever. Eyes: Negative for visual changes. ENT: Negative for sore throat. + tinnitus, pounding in her ears Neck: No neck pain  Cardiovascular: Negative for chest pain. + palpitations Respiratory: Negative for shortness of breath. Gastrointestinal: Negative for abdominal pain, vomiting or diarrhea. Genitourinary:  Negative for dysuria. Musculoskeletal: Negative for back pain. Skin: Negative for rash. Neurological: Negative for headaches, weakness or numbness. Psych: No SI or HI  ____________________________________________   PHYSICAL EXAM:  VITAL SIGNS: ED Triage Vitals  Enc Vitals Group     BP 02/09/17 0133 (!) 134/57     Pulse Rate 02/09/17 0133 79     Resp 02/09/17 0133 20     Temp 02/09/17 0133 98.1 F (36.7 C)     Temp Source 02/09/17 0133 Oral     SpO2 02/09/17 0133 97 %     Weight 02/09/17 0134 245 lb (111.1 kg)     Height 02/09/17 0134 5\' 5"  (1.651 m)     Head Circumference --      Peak Flow --      Pain Score 02/09/17 0136 9     Pain Loc --      Pain Edu? --      Excl. in [place]? --     Constitutional: Alert and oriented. Well appearing and in no apparent distress. HEENT:      Head: Normocephalic and atraumatic.         Eyes: Conjunctivae are normal. Sclera is non-icteric. EOMI. PERRL      Mouth/Throat: Mucous membranes are moist.       Ear: TMs visualized, normal landmarks, no bulging, no erythema, normal external canal      Neck: Supple with no signs of meningismus. Cardiovascular: Regular rate and rhythm. No murmurs, gallops, or rubs. 2+ symmetrical distal pulses are present in all extremities. No JVD. Respiratory: Normal respiratory effort. Lungs are clear to auscultation bilaterally. No wheezes, crackles, or rhonchi.  Gastrointestinal: Soft, non tender, and non distended with positive bowel sounds. No rebound or guarding. Musculoskeletal: Nontender with normal range of motion in all extremities. No edema, cyanosis, or erythema of extremities. Neurologic: Normal speech and language. A & O x3, PERRL, no nystagmus, CN II-XII intact, motor testing reveals good tone and bulk throughout. There is no evidence of pronator drift or dysmetria. Muscle strength is 5/5 throughout. Deep tendon reflexes are 2+ throughout with downgoing  toes. Sensory examination is intact. Gait  deferred Skin: Skin is warm, dry and intact. No rash noted. Psychiatric: Mood and affect are normal. Speech and behavior are normal.  ____________________________________________   LABS (all labs ordered are listed, but only abnormal results are displayed)  Labs Reviewed  CBC - Abnormal; Notable for the following:       Result Value   Hemoglobin 9.6 (*)    HCT 29.7 (*)    MCV 66.8 (*)    MCH 21.7 (*)    RDW 18.3 (*)    All other components within normal limits  COMPREHENSIVE METABOLIC PANEL - Abnormal; Notable for the following:    Glucose, Bld 63 (*)    Creatinine, Ser 1.46 (*)    GFR calc non Af Amer 33 (*)    GFR calc Af Amer 38 (*)    All other components within normal limits  PROTIME-INR - Abnormal; Notable for the following:    Prothrombin Time 21.6 (*)    All other components within normal limits  MAGNESIUM - Abnormal; Notable for the following:    Magnesium 1.6 (*)    All other components within normal limits  TROPONIN I  TROPONIN I   ____________________________________________  EKG  ED ECG REPORT I, Rudene Re, the attending physician, personally viewed and interpreted this ECG.  Atrial paced rhythm, rate of 65, prolonged PR interval, normal QRS and QTc, normal axis, no ST elevations or depressions. ____________________________________________  RADIOLOGY  none ____________________________________________   PROCEDURES  Procedure(s) performed: None Procedures Critical Care performed:  None ____________________________________________   INITIAL IMPRESSION / ASSESSMENT AND PLAN / ED COURSE   82 y.o. female with a history of sick sinus syndrome status post pacemaker, CHF, hyperlipidemia, anemia, diabetes, hypertension who presents for evaluation of hearing problems and palpitations. No palpitations at this time. Patient had carotid dopplers to evaluate this problem in 12/2016 which showed mild carotid plaque. She has been referred to ENT but has  not had a chance to see them yet for this. On exam she is well appearing, neurologically intact, your exam with no acute findings. EKG with no evidence of arrhythmias. Patient is being paced with a normal ventricular rate. We'll interrogate the pacer since she's been complaining of palpitations throughout the day today. Blood work showing a stable hemoglobin, mildly elevated creatinine, normal electrolytes. Will monitor on telemetry while pacer report is pending. Patient probably has meniere's disease based on her symptoms.   Clinical Course as of Feb 10 711  Fri Feb 09, 2017  1937 Pacer interrogation showing no evidence of arrhythmias in the last 24 hours. Good battery.  [CV]    Clinical Course User Index [CV] Rudene Re, MD   _________________________ 7:12 AM on 02/09/2017 -----------------------------------------  Patient given 500cc for elevated creatinine. Mag replaced. Will be dc home with f/u with ENT.  Pertinent labs & imaging results that were available during my care of the patient were reviewed by me and considered in my medical decision making (see chart for details).    ____________________________________________   FINAL CLINICAL IMPRESSION(S) / ED DIAGNOSES  Final diagnoses:  Palpitations  Hearing difficulty, unspecified laterality  Hypomagnesemia      NEW MEDICATIONS STARTED DURING THIS VISIT:  New Prescriptions   No medications on file     Note:  This document was prepared using Dragon voice recognition software and may include unintentional dictation errors.    Rudene Re, MD 02/09/17 [phone]

## 2017-02-10 NOTE — Progress Notes (Deleted)
* [facility]     Assessment and Plan:  OSA --Continues to have episodes of waking at night with difficulty breathing, uncertain if this may due to inadequately treated OSA.  --Will change auto-set pressure to 10-20; if not improved will consider sending patient for a CPAP titration study if covered.   RLS -Appears adequately controlled at this time.  Insomnia. -Sleep maintenance type, patient wakes up at around 3 AM, sometimes of pounding in her ears and rapid heartbeat. Cardiology workup has been unrevealing. -Continue on Xanax 0.25 mg daily at bedtime.   Date: 02/10/2017  MRN# [id number] [name] [name] [birth year]   [name] [name] is a 82 y.o. old female seen in follow up for chief complaint of  No chief complaint on file.    HPI:   Patient is a 82 yo female with history of OSA and RLS,  Afib, SSS, HTN, DM, GERD. She was previously seen in [place], and is interested in transferring her care down here. Patient notes since he typically goes to bed between 10 and 11 PM, she wakes up to 2 and 4 AM. She usually takes a medication to help her fall asleep, she gets out of bed at 6 AM.  She notes that she is waking with a rapid heart beat with pounding in her ears. She takes a xanax 0.25 mg (half of a 0.5 mg tab) at bed time. If she does not fall asleep she will take the other half, this happens about twice per week but she still wakes up at the same time. She sees a cardiologist and has used a holter monitor but nothing was found. She denies anxiety at those times.   Review of download data for the month of October 2017: Average usage is 6 hours 40 minutes, used on 93% of days. AutoSet 5-20. Residual AHI is 1.4, 95th percentile pressure was 11.2.  PSG 10/24/13 >> AHI 7 Auto CPAP 05/10/15 to 06/08/15 >> used on 29 of 30 nights with average 6 hrs and 41 min.  Average AHI is 1.7 with median CPAP 8 cm H2O and 95 th percentile CPAP 12 cm H20.   PMhx >> Paroxymal atrial  fibrillation, SSS s/p PM, HTN, Diastolic CHF, HLD, DM, HH, GERD, Glaucoma  Medication:   Outpatient Encounter Prescriptions as of 02/12/2017  Medication Sig  . acetaminophen (TYLENOL) 500 MG tablet Take 500 mg by mouth every 6 (six) hours as needed.  . ALPRAZolam (XANAX) 0.5 MG tablet Take 1 tablet (0.5 mg total) by mouth daily as needed.  Marland Kitchen amLODipine (NORVASC) 10 MG tablet TAKE 1 TABLET (10 MG TOTAL) BY MOUTH DAILY.  Marland Kitchen atorvastatin (LIPITOR) 20 MG tablet TAKE 1 TABLET (20 MG TOTAL) BY MOUTH DAILY.  . Brinzolamide-Brimonidine (SIMBRINZA) 1-0.2 % SUSP Place 1 drop into both eyes 2 (two) times daily.  . carvedilol (COREG) 12.5 MG tablet Take 1 tablet (12.5 mg) in the am and 1 and 1/2 tablet in the evening.  . carvedilol (COREG) 12.5 MG tablet Take 1 tablet in the morning with breakfast and 2 tablets pm daily by mouth in the evening before bed.  . doxycycline (VIBRA-TABS) 100 MG tablet Take 1 tablet (100 mg total) by mouth 2 (two) times daily.  Marland Kitchen esomeprazole (NEXIUM) 40 MG capsule TAKE 1 CAPSULE (40 MG TOTAL) BY MOUTH DAILY.  . flecainide (TAMBOCOR) 50 MG tablet Take 1 tablet (50 mg total) by mouth 2 (two) times daily.  . fluticasone (FLONASE) 50 MCG/ACT nasal spray PLACE  2 SPRAYS INTO BOTH NOSTRILS DAILY.  . furosemide (LASIX) 20 MG tablet Take 1 tablet (20 mg total) by mouth daily.  Marland Kitchen glipiZIDE (GLUCOTROL) 10 MG tablet TAKE 1 TABLET (10 MG TOTAL) BY MOUTH 2 (TWO) TIMES DAILY BEFORE A MEAL.  . metFORMIN (GLUCOPHAGE-XR) 500 MG 24 hr tablet TAKE 1 TABLET (500 MG TOTAL) BY MOUTH 2 (TWO) TIMES DAILY.  . mometasone (ELOCON) 0.1 % cream Apply topically daily. Apply to affected area as directed two times a day  . Multiple Vitamins-Minerals (CENTRUM SILVER PO) Take by mouth daily.  Marland Kitchen olmesartan (BENICAR) 40 MG tablet TAKE 1 TABLET (40 MG TOTAL) BY MOUTH DAILY.  Marland Kitchen TRADJENTA 5 MG TABS tablet TAKE 1 TABLET (5 MG TOTAL) BY MOUTH DAILY.  Marland Kitchen travoprost, benzalkonium, (TRAVATAN) 0.004 % ophthalmic solution  Place 1 drop into both eyes at bedtime.   Marland Kitchen warfarin (COUMADIN) 3 MG tablet TAKE 1-1.5 TABLETS DAILY AT 6 PM. TAKE 1 TAB ON TUES, THURS, SAT, SUN. TAKE 4.5 MG ON MON, WED, FRI   No facility-administered encounter medications on file as of 02/12/2017.      Allergies:  Metoprolol; Diltiazem; Levofloxacin in d5w; and Levofloxacin  Review of Systems: Gen:  Denies  fever, sweats. HEENT: Denies blurred vision. Cvc:  No dizziness, chest pain or heaviness Resp:   Denies cough or sputum porduction. Gi: Denies swallowing difficulty, stomach pain.  Gu:  Denies bladder incontinence, burning urine Ext:   No Joint pain, stiffness. Skin: No skin rash, easy bruising. Endoc:  No polyuria, polydipsia. Psych: No depression, insomnia. Other:  All other systems were reviewed and found to be negative other than what is mentioned in the HPI.   Physical Examination:   VS: There were no vitals taken for this visit.  General Appearance: No distress  Neuro:without focal findings,  speech normal,  HEENT: PERRLA, EOM intact. Pulmonary: normal breath sounds, No wheezing.   CardiovascularNormal S1,S2.  No m/r/g.   Abdomen: Benign, Soft, non-tender. Renal:  No costovertebral tenderness  GU:  Not performed at this time. Endoc: No evident thyromegaly, no signs of acromegaly. Skin:   warm, no rash. Extremities: normal, no cyanosis, clubbing.   LABORATORY PANEL:   CBC  Recent Labs Lab 02/09/17 0136  WBC 9.3  HGB 9.6*  HCT 29.7*  PLT 242   ------------------------------------------------------------------------------------------------------------------  Chemistries   Recent Labs Lab 02/09/17 0136 02/09/17 0551  NA 140  --   K 3.5  --   CL 106  --   CO2 27  --   GLUCOSE 63*  --   BUN 19  --   CREATININE 1.46*  --   CALCIUM 9.2  --   MG  --  1.6*  AST 22  --   ALT 20  --   ALKPHOS 76  --   BILITOT 0.3  --     ------------------------------------------------------------------------------------------------------------------  Cardiac Enzymes  Recent Labs Lab 02/09/17 0551  TROPONINI <0.03   ------------------------------------------------------------  RADIOLOGY:   No results found for this or any previous visit. Results for orders placed during the hospital encounter of 10/26/15  DG Chest 2 View   Narrative CLINICAL DATA:  Cough.  EXAM: CHEST  2 VIEW  COMPARISON:  08/03/2014 .  FINDINGS: Cardiac pacer with lead tip in the right atrium and right ventricle. Cardiomegaly with pulmonary vascular prominence and mild interstitial prominence noted. Pleural effusion or pneumothorax. Degenerative changes thoracic spine with stable mild mid thoracic vertebral body compression fracture.  IMPRESSION: 1. Cardiac pacer stable position.  Cardiomegaly with mild pulmonary interstitial prominence. Mild component congestive heart failure cannot be excluded. Mild pneumonitis cannot be excluded. 2. Diffuse degenerative changes and osteopenia thoracic spine. Stable mild mid thoracic vertebral body compression fracture.   Electronically Signed   By: Marcello Moores  Register   On: 10/26/2015 14:54    ------------------------------------------------------------------------------------------------------------------  Thank  you for allowing [facility] to assist in the care of your patient. Our recommendations are noted above.  Please contact us if we can be of further service.   Marda Stalker, MD.   Pulmonary and Critical Care Office Number: [phone]  Patricia Pesa, M.D.  Vilinda Boehringer, M.D.  Merton Border, M.D  02/10/2017

## 2017-02-12 ENCOUNTER — Encounter: Payer: Self-pay | Admitting: *Deleted

## 2017-02-12 ENCOUNTER — Ambulatory Visit: Payer: Medicare Other | Admitting: Internal Medicine

## 2017-02-12 DIAGNOSIS — I5032 Chronic diastolic (congestive) heart failure: Secondary | ICD-10-CM

## 2017-02-12 NOTE — Progress Notes (Signed)
Pulmonary Individual Treatment Plan  Patient Details  Name: [name] [name] MRN: [id number] Date of Birth: 09/12/1935 Referring Provider:   Flowsheet Row Pulmonary Rehab from 12/19/2016 in [facility] Cardiac and Pulmonary Rehab  Referring Provider  Gollan      Initial Encounter Date:  Flowsheet Row Pulmonary Rehab from 12/19/2016 in [facility] Cardiac and Pulmonary Rehab  Date  12/19/16  Referring Provider  Rockey Situ      Visit Diagnosis: Chronic diastolic congestive heart failure ([place])  Patient's Home Medications on Admission:  Current Outpatient Prescriptions:  .  acetaminophen (TYLENOL) 500 MG tablet, Take 500 mg by mouth every 6 (six) hours as needed., Disp: , Rfl:  .  ALPRAZolam (XANAX) 0.5 MG tablet, Take 1 tablet (0.5 mg total) by mouth daily as needed., Disp: 90 tablet, Rfl: 0 .  amLODipine (NORVASC) 10 MG tablet, TAKE 1 TABLET (10 MG TOTAL) BY MOUTH DAILY., Disp: 90 tablet, Rfl: 0 .  atorvastatin (LIPITOR) 20 MG tablet, TAKE 1 TABLET (20 MG TOTAL) BY MOUTH DAILY., Disp: 90 tablet, Rfl: 0 .  Brinzolamide-Brimonidine (SIMBRINZA) 1-0.2 % SUSP, Place 1 drop into both eyes 2 (two) times daily., Disp: 1 Bottle, Rfl: 1 .  carvedilol (COREG) 12.5 MG tablet, Take 1 tablet (12.5 mg) in the am and 1 and 1/2 tablet in the evening., Disp: , Rfl:  .  carvedilol (COREG) 12.5 MG tablet, Take 1 tablet in the morning with breakfast and 2 tablets pm daily by mouth in the evening before bed., Disp: 90 tablet, Rfl: 3 .  doxycycline (VIBRA-TABS) 100 MG tablet, Take 1 tablet (100 mg total) by mouth 2 (two) times daily., Disp: 14 tablet, Rfl: 0 .  esomeprazole (NEXIUM) 40 MG capsule, TAKE 1 CAPSULE (40 MG TOTAL) BY MOUTH DAILY., Disp: 90 capsule, Rfl: 1 .  flecainide (TAMBOCOR) 50 MG tablet, Take 1 tablet (50 mg total) by mouth 2 (two) times daily., Disp: 60 tablet, Rfl: 6 .  fluticasone (FLONASE) 50 MCG/ACT nasal spray, PLACE 2 SPRAYS INTO BOTH NOSTRILS DAILY., Disp: 16 g, Rfl: 2 .  furosemide (LASIX) 20 MG tablet,  Take 1 tablet (20 mg total) by mouth daily., Disp: 90 tablet, Rfl: 0 .  glipiZIDE (GLUCOTROL) 10 MG tablet, TAKE 1 TABLET (10 MG TOTAL) BY MOUTH 2 (TWO) TIMES DAILY BEFORE A MEAL., Disp: 180 tablet, Rfl: 0 .  metFORMIN (GLUCOPHAGE-XR) 500 MG 24 hr tablet, TAKE 1 TABLET (500 MG TOTAL) BY MOUTH 2 (TWO) TIMES DAILY., Disp: 180 tablet, Rfl: 0 .  mometasone (ELOCON) 0.1 % cream, Apply topically daily. Apply to affected area as directed two times a day, Disp: 45 g, Rfl: 0 .  Multiple Vitamins-Minerals (CENTRUM SILVER PO), Take by mouth daily., Disp: , Rfl:  .  olmesartan (BENICAR) 40 MG tablet, TAKE 1 TABLET (40 MG TOTAL) BY MOUTH DAILY., Disp: 90 tablet, Rfl: 0 .  TRADJENTA 5 MG TABS tablet, TAKE 1 TABLET (5 MG TOTAL) BY MOUTH DAILY., Disp: 30 tablet, Rfl: 2 .  travoprost, benzalkonium, (TRAVATAN) 0.004 % ophthalmic solution, Place 1 drop into both eyes at bedtime. , Disp: , Rfl:  .  warfarin (COUMADIN) 3 MG tablet, TAKE 1-1.5 TABLETS DAILY AT 6 PM. TAKE 1 TAB ON TUES, THURS, SAT, SUN. TAKE 4.5 MG ON MON, WED, FRI, Disp: 100 tablet, Rfl: 3  Past Medical History: Past Medical History:  Diagnosis Date  . 1st degree AV block    a. PR 400 msec with Apacing  . Anemia   . Anxiety   . Arthritis  a. shoulders.  . Chest pain    a. 2004 Cath:  reportedly nl;  b. 09/2013 Neg MV.  . Chronic diastolic heart failure ([place])    a. 09/2013 Echo: EF 50-55%, mild LVH, mild to mod MR/TR.  Marland Kitchen Esophageal reflux   . H/O hiatal hernia   . History of UTI   . Hyperlipidemia   . Morbid obesity ([place])   . OSA on CPAP   . PAF (paroxysmal atrial fibrillation) ([place])   . SSS (sick sinus syndrome) ([place])    a. 2008 s/p MDT PPM;  b. 11/2013 Gen change- MDT ADDRL1 Adapta DC PPM, ser # [id number] H.  . Symptomatic PVCs   . Syncope and collapse    a. Felt to be vasovagal.  . Type II diabetes mellitus ([place])   . Unspecified essential hypertension   . Unspecified glaucoma(365.9)     Tobacco Use: History  Smoking Status  .  Former Smoker  . Packs/day: 0.50  . Years: 34.00  . Types: Cigarettes  . Quit date: 08/15/1989  Smokeless Tobacco  . Never Used    Labs: Recent Review Flowsheet Data    Labs for ITP Cardiac and Pulmonary Rehab Latest Ref Rng & Units 12/16/2015 03/15/2016 07/28/2016 11/01/2016 02/02/2017   Cholestrol <200 mg/dL 153 144 - 156 -   LDLCALC <100 mg/dL 72 67 - 79 -   HDL >50 mg/dL 61 49 - 58 -   Trlycerides <150 mg/dL 100 138 - 93 -   Hemoglobin A1c - 7.8 8.2 6.8 6.8 6.7       ADL UCSD:     Pulmonary Assessment Scores    Row Name 12/19/16 1158         ADL UCSD   ADL Phase Entry     SOB Score total 21     Rest 1     Walk 0     Stairs 2     Bath 0     Dress 0     Shop 0        Pulmonary Function Assessment:     Pulmonary Function Assessment - 12/19/16 1157      Initial Spirometry Results   FVC% 85 %   FEV1% 101 %   FEV1/FVC Ratio 91.51     Breath   Bilateral Breath Sounds Clear   Shortness of Breath Yes      Exercise Target Goals:    Exercise Program Goal: Individual exercise prescription set with THRR, safety & activity barriers. Participant demonstrates ability to understand and report RPE using BORG scale, to self-measure pulse accurately, and to acknowledge the importance of the exercise prescription.  Exercise Prescription Goal: Starting with aerobic activity 30 plus minutes a day, 3 days per week for initial exercise prescription. Provide home exercise prescription and guidelines that participant acknowledges understanding prior to discharge.  Activity Barriers & Risk Stratification:     Activity Barriers & Cardiac Risk Stratification - 12/19/16 1157      Activity Barriers & Cardiac Risk Stratification   Activity Barriers Arthritis;Back Problems;Joint Problems;Shortness of Breath;Deconditioning;Muscular Weakness      6 Minute Walk:     6 Minute Walk    Row Name 12/19/16 1201         6 Minute Walk   Distance 965 feet     Walk Time 6 minutes      # of Rest Breaks 0     MPH 1.82     METS 2.38     RPE 15  Perceived Dyspnea  2     VO2 Peak 5.43     Symptoms No     Resting HR 68 bpm     Resting BP 122/66     Max Ex. HR 113 bpm     Max Ex. BP 172/72       Interval HR   Baseline HR 68     1 Minute HR 110     2 Minute HR 104     3 Minute HR 102     5 Minute HR 98     6 Minute HR 113     Interval Heart Rate? Yes       Interval Oxygen   Interval Oxygen? Yes     Baseline Oxygen Saturation % 97 %     1 Minute Oxygen Saturation % 96 %     2 Minute Oxygen Saturation % 94 %     3 Minute Oxygen Saturation % 95 %     5 Minute Oxygen Saturation % 96 %     6 Minute Oxygen Saturation % 95 %       Oxygen Initial Assessment:   Oxygen Re-Evaluation:   Oxygen Discharge (Final Oxygen Re-Evaluation):   Initial Exercise Prescription:     Initial Exercise Prescription - 12/19/16 1200      Date of Initial Exercise RX and Referring Provider   Date 12/19/16   Referring Provider Gollan     Treadmill   MPH 1.5   Grade 0.5   Minutes 15   METs 2.25     Recumbant Bike   Level 1   RPM 60   Minutes 15   METs 2     NuStep   Level 3   Minutes 15   METs 2.25     Biostep-RELP   Level 2   Minutes 15   METs 2     Prescription Details   Frequency (times per week) 3   Duration Progress to 45 minutes of aerobic exercise without signs/symptoms of physical distress     Intensity   THRR 40-80% of Max Heartrate 97-126   Ratings of Perceived Exertion 11-13   Perceived Dyspnea 0-4     Progression   Progression Continue to progress workloads to maintain intensity without signs/symptoms of physical distress.     Resistance Training   Training Prescription Yes   Weight 3   Reps 10-15      Perform Capillary Blood Glucose checks as needed.  Exercise Prescription Changes:     Exercise Prescription Changes    Row Name 12/27/16 1200 01/05/17 0900 01/10/17 1100 01/18/17 1100 02/02/17 1200     Response to Exercise    Blood Pressure (Admit) 112/54 134/70  - 130/66 134/60   Blood Pressure (Exercise) 124/66 148/82  - 128/70 126/84   Blood Pressure (Exit) 108/50 120/74  - 108/58 124/64   Heart Rate (Admit) 79 bpm 79 bpm  - 67 bpm 80 bpm   Heart Rate (Exercise) 77 bpm 68 bpm  - 100 bpm 76 bpm   Heart Rate (Exit) 71 bpm 60 bpm  - 69 bpm 67 bpm   Oxygen Saturation (Admit) 98 % 98 %  - 97 % 97 %   Oxygen Saturation (Exercise) 98 % 98 %  - 96 % 97 %   Oxygen Saturation (Exit) 100 % 99 %  - 99 % 99 %   Rating of Perceived Exertion (Exercise) 13 13  - 11 13   Perceived Dyspnea (  Exercise) 2 3  - 2 2   Comments  -  - Home Exercise Guidelines given 01/10/17  -  -   Duration Progress to 45 minutes of aerobic exercise without signs/symptoms of physical distress Progress to 45 minutes of aerobic exercise without signs/symptoms of physical distress Progress to 45 minutes of aerobic exercise without signs/symptoms of physical distress Progress to 45 minutes of aerobic exercise without signs/symptoms of physical distress Progress to 45 minutes of aerobic exercise without signs/symptoms of physical distress   Intensity THRR unchanged THRR unchanged THRR unchanged THRR unchanged THRR unchanged     Progression   Progression Continue to progress workloads to maintain intensity without signs/symptoms of physical distress. Continue to progress workloads to maintain intensity without signs/symptoms of physical distress. Continue to progress workloads to maintain intensity without signs/symptoms of physical distress. Continue to progress workloads to maintain intensity without signs/symptoms of physical distress. Continue to progress workloads to maintain intensity without signs/symptoms of physical distress.     Resistance Training   Training Prescription Yes Yes Yes Yes Yes   Weight '2 2 2 2 2   ' Reps 10-15 10-15 10-15 10-15  -     Treadmill   MPH 1  -  - 1.7  -   Grade 0  -  - 0  -   Minutes 15  -  - 15  -   METs 1.77  -  - 2.3   -     NuStep   Level '3 3 3 3 3   ' Minutes '15 15 15 15 15   ' METs  -  -  - 2.3 2.3     Biostep-RELP   Level  - '2 2 3 3   ' Minutes  - '15 15 15 15   ' METs  - 2 2  - 2     Home Exercise Plan   Plans to continue exercise at  -  - [employer] (comment)  [employer] at [employer] and walking  -  -   Frequency  -  - Add 2 additional days to program exercise sessions.  -  -     Exercise Review   Progression  -  -  - Yes  -      Exercise Comments:     Exercise Comments    Row Name 12/29/16 1115 01/05/17 0946 01/10/17 1157 01/18/17 1146 02/02/17 1226   Exercise Comments Ms. Siever was unable to follow exercise prescription today, due to pt became dizzy on the treadmill.  She was advised to sit in chair, where we obtained BP and blood glucose.  BP 140/60 and BG 111.  Pt sat for 5 minutes and stated she felt better and continued on to next machine, T4.  Pt did well with no other complaints today.  Will continue to monitor for progression. Sky has tolerated exrecise very well in her first week of exercise. Reviewed home exercise with pt today.  Pt plans to use [facility] [employer] at [facility] and walk at home for exercise.  Reviewed THR, pulse, RPE, sign and symptoms, NTG use, and when to call 911 or MD.  Also discussed weather considerations and indoor options.  Pt voiced understanding. Enas has made significant improvement walking on TM.  Staff will continue to monitor progression. Tyshawn was absent last week but continues to tolerate exercise well.      Exercise Goals and Review:   Exercise Goals Re-Evaluation :   Discharge Exercise Prescription (Final Exercise Prescription Changes):  Exercise Prescription Changes - 02/02/17 1200      Response to Exercise   Blood Pressure (Admit) 134/60   Blood Pressure (Exercise) 126/84   Blood Pressure (Exit) 124/64   Heart Rate (Admit) 80 bpm   Heart Rate (Exercise) 76 bpm   Heart Rate (Exit) 67 bpm   Oxygen Saturation (Admit) 97 %   Oxygen  Saturation (Exercise) 97 %   Oxygen Saturation (Exit) 99 %   Rating of Perceived Exertion (Exercise) 13   Perceived Dyspnea (Exercise) 2   Duration Progress to 45 minutes of aerobic exercise without signs/symptoms of physical distress   Intensity THRR unchanged     Progression   Progression Continue to progress workloads to maintain intensity without signs/symptoms of physical distress.     Resistance Training   Training Prescription Yes   Weight 2     NuStep   Level 3   Minutes 15   METs 2.3     Biostep-RELP   Level 3   Minutes 15   METs 2      Nutrition:  Target Goals: Understanding of nutrition guidelines, daily intake of sodium <1532m, cholesterol <2039m calories 30% from fat and 7% or less from saturated fats, daily to have 5 or more servings of fruits and vegetables.  Biometrics:     Pre Biometrics - 12/19/16 1159      Pre Biometrics   Height '5\' 5"'  (1.651 m)   Weight 242 lb 12.8 oz (110.1 kg)   Waist Circumference 47 inches   Hip Circumference 51.5 inches   Waist to Hip Ratio 0.91 %   BMI (Calculated) 40.5       Nutrition Therapy Plan and Nutrition Goals:   Nutrition Discharge: Rate Your Plate Scores:   Nutrition Goals Re-Evaluation:   Nutrition Goals Discharge (Final Nutrition Goals Re-Evaluation):   Psychosocial: Target Goals: Acknowledge presence or absence of significant depression and/or stress, maximize coping skills, provide positive support system. Participant is able to verbalize types and ability to use techniques and skills needed for reducing stress and depression.   Initial Review & Psychosocial Screening:     Initial Psych Review & Screening - 12/19/16 1205      Family Dynamics   Good Support System? Yes   Comments Ms EvChronisas good support from her family. Her 2 sisters participate in the [place] at the [employer] [facility] [employer]. She likes being active by visiting sick friends and shopping. She is looking forward to [employer]     Barriers   Psychosocial barriers to participate in program There are no identifiable barriers or psychosocial needs.;The patient should benefit from training in stress management and relaxation.     Screening Interventions   Interventions Encouraged to exercise;Program counselor consult      Quality of Life Scores:     Quality of Life - 12/19/16 1208      Quality of Life Scores   Health/Function Pre 21 %   Socioeconomic Pre 21 %   Psych/Spiritual Pre 20.86 %   Family Pre 20.4 %   GLOBAL Pre 20.71 %      PHQ-9: Recent Review Flowsheet Data    [facility] 12/19/2016 12/19/2016 09/18/2016 03/23/2016 03/15/2016   Decreased Interest 2 0 0 0 0   Down, Depressed, Hopeless 0 0 0 0 0   PHQ - 2 Score 2 0 0 0 0   Altered sleeping 3 - - - -   Tired, decreased energy 3 - - - -  Change in appetite 0 - - - -   Feeling bad or failure about yourself  0 - - - -   Trouble concentrating 0 - - - -   Moving slowly or fidgety/restless 0 - - - -   Suicidal thoughts 0 - - - -   PHQ-9 Score 8 - - - -   Difficult doing work/chores Very difficult - - - -     Interpretation of Total Score  Total Score Depression Severity:  1-4 = Minimal depression, 5-9 = Mild depression, 10-14 = Moderate depression, 15-19 = Moderately severe depression, 20-27 = Severe depression   Psychosocial Evaluation and Intervention:     Psychosocial Evaluation - 01/01/17 1115      Psychosocial Evaluation & Interventions   Interventions Encouraged to exercise with the program and follow exercise prescription;Relaxation education;Stress management education   Comments Counselor met with Ms. Rickert today for initial psychosocial evaluation.  She is a lovely 82 year old who has CHF and Diabetes and began struggling more with fatigue and shortness of breath.  Although she lives alone, she has a strong support system with a large family of sisters and (2) adult sons who live closeby.  She is also actively  involved in her local church.  Ms. Folden reports the main problem she has is her interrupted sleep after 3-4 hours each night.  She has a CPAP and is prescribed Xanax to be taken as needed to help, but this continues to be problematic.  She has a good appetite and she reports she is typically in a positive mood.  Ms. Sakurai denies a history of depression or current symptoms, but admits there has been some anxiety symptoms since she was diagnosed with CHF and is not resting well.  She states the medications help with these symptoms, but walking and moving is the best remedy she reports and is looking forward to doing that more consistently for her sleep as well as anxiety symptoms while in this program.  Ms. Mancias has goals to increase her strength, stamina and overall energy and get back to doing more normal household activities while in Pulmonary Rehab.  She would also like to sleep better and lose some weight.  Counselor encouraged her to see the dietician to discuss her nutrition and weight loss goals.  Counselor also encouraged Ms. Pollok to consistently exercise and move to improve her sleep quality and anxiety symptoms - which she reported had helped in the past.  Staff will follow with Ms. Verrastro throughout the course of this program.     Continue Psychosocial Services  Yes  Follow with Ms. Pennino re: her sleep and anxiety symptoms -whether exercise has helped.      Psychosocial Re-Evaluation:     Psychosocial Re-Evaluation    Row Name 01/24/17 1123 02/07/17 1204           Psychosocial Re-Evaluation   Comments Counselor follow up with Stanton Kidney today reporting she is enjoying this program and always looks forward to coming.  She states ongoing problems with sleep interruption after maybe 3-4 hours per night of sleep.  She reports she is awakened to her "heart pounding and feeling it particularly in her right ear."  Her Drs have looked at this from many perspectives and are considering an ENT later in  March.  However, her cardiologist found a swollen thyroid in the meantime and she is scheduled for a biopsy on March 1st for this.  She also has fungus under one  of her fingernails and is getting that looked at, all the while changing primary care doctors in the meantime.  However, Bianney continues to be positive and upbeat and states exercise and her faith and strong support system keep her this way.  Counselor commended Ms. Mcnicholas for her consistently positive attitude with so much going on; as well as her commitment to consistency in exercise.  Counselor encouraged Annayah to practice deep breathing when she wakes up with her rapid heart beat and pounding in her ears.  She agreed to try.   Counselor follow up with [facility] today.  She states the biopsy on her thyroid was benign and she had blood work done yesterday to determine what is causing her negative symptoms (heart pounding and feeling it in her ear).  She is in transition with primary care providers and will see her new PCP later this month.  After that she plans to get a referral for an ENT to get to the bottom of these symptoms.  She states she is sleeping better lately and is noticing more strength and flexibility.  She is not experiencing dizziness when she bends over any more since coming into this program.  She enjoys the socialization as well and keeps a sweet smile on her face most of the time.  She manages her stress by exercise; prayer and relying on her family and friends.  Couinselor commended [place] for all the hard work and progress made while being in this program.        Expected [place] will continue to exercise and experience improved sleep; mood; stamina and strength.  She will continue to pursue medical assistance with her health concerns.        Interventions Relaxation education  -         Psychosocial Discharge (Final Psychosocial Re-Evaluation):     Psychosocial Re-Evaluation - 02/07/17 1204      Psychosocial Re-Evaluation    Comments Counselor follow up with [facility] today.  She states the biopsy on her thyroid was benign and she had blood work done yesterday to determine what is causing her negative symptoms (heart pounding and feeling it in her ear).  She is in transition with primary care providers and will see her new PCP later this month.  After that she plans to get a referral for an ENT to get to the bottom of these symptoms.  She states she is sleeping better lately and is noticing more strength and flexibility.  She is not experiencing dizziness when she bends over any more since coming into this program.  She enjoys the socialization as well and keeps a sweet smile on her face most of the time.  She manages her stress by exercise; prayer and relying on her family and friends.  Couinselor commended [place] for all the hard work and progress made while being in this program.     Expected [place] will continue to exercise and experience improved sleep; mood; stamina and strength.  She will continue to pursue medical assistance with her health concerns.        Education: Education Goals: Education classes will be provided on a weekly basis, covering required topics. Participant will state understanding/return demonstration of topics presented.  Learning Barriers/Preferences:     Learning Barriers/Preferences - 12/19/16 1157      Learning Barriers/Preferences   Learning Barriers None   Learning Preferences None      Education Topics: Initial Evaluation Education: - Verbal, written and  demonstration of respiratory meds, RPE/PD scales, oximetry and breathing techniques. Instruction on use of nebulizers and MDIs: cleaning and proper use, rinsing mouth with steroid doses and importance of monitoring MDI activations. Flowsheet Row Pulmonary Rehab from 02/07/2017 in [facility] Cardiac and Pulmonary Rehab  Date  12/19/16  Educator  LB  Instruction Review Code  2- meets goals/outcomes      General Nutrition  Guidelines/Fats and Fiber: -Group instruction provided by verbal, written material, models and posters to present the general guidelines for heart healthy nutrition. Gives an explanation and review of dietary fats and fiber. Flowsheet Row Pulmonary Rehab from 02/07/2017 in [facility] Cardiac and Pulmonary Rehab  Date  02/05/17  Educator  CR  Instruction Review Code  2- meets goals/outcomes      Controlling Sodium/Reading Food Labels: -Group verbal and written material supporting the discussion of sodium use in heart healthy nutrition. Review and explanation with models, verbal and written materials for utilization of the food label.   Exercise Physiology & Risk Factors: - Group verbal and written instruction with models to review the exercise physiology of the cardiovascular system and associated critical values. Details cardiovascular disease risk factors and the goals associated with each risk factor.   Aerobic Exercise & Resistance Training: - Gives group verbal and written discussion on the health impact of inactivity. On the components of aerobic and resistive training programs and the benefits of this training and how to safely progress through these programs. Flowsheet Row Pulmonary Rehab from 02/07/2017 in [facility] Cardiac and Pulmonary Rehab  Date  01/03/17  Educator  [facility]  Instruction Review Code  2- meets goals/outcomes      Flexibility, Balance, General Exercise Guidelines: - Provides group verbal and written instruction on the benefits of flexibility and balance training programs. Provides general exercise guidelines with specific guidelines to those with heart or lung disease. Demonstration and skill practice provided. Flowsheet Row Pulmonary Rehab from 02/07/2017 in [facility] Cardiac and Pulmonary Rehab  Date  01/26/17  Educator  Griselda Miner,   Instruction Review Code  2- meets goals/outcomes      Stress Management: - Provides group verbal and written instruction about the health risks  of elevated stress, cause of high stress, and healthy ways to reduce stress.   Depression: - Provides group verbal and written instruction on the correlation between heart/lung disease and depressed mood, treatment options, and the stigmas associated with seeking treatment. Flowsheet Row Pulmonary Rehab from 02/07/2017 in [facility] Cardiac and Pulmonary Rehab  Date  02/07/17  Educator  [facility]  Instruction Review Code  2- meets goals/outcomes      Exercise & Equipment Safety: - Individual verbal instruction and demonstration of equipment use and safety with use of the equipment. Flowsheet Row Pulmonary Rehab from 02/07/2017 in [facility] Cardiac and Pulmonary Rehab  Date  12/27/16  Educator  [facility]  Instruction Review Code  2- meets goals/outcomes      Infection Prevention: - Provides verbal and written material to individual with discussion of infection control including proper hand washing and proper equipment cleaning during exercise session. Flowsheet Row Pulmonary Rehab from 02/07/2017 in [facility] Cardiac and Pulmonary Rehab  Date  12/19/16  Educator  LB  Instruction Review Code  2- meets goals/outcomes      Falls Prevention: - Provides verbal and written material to individual with discussion of falls prevention and safety. Flowsheet Row Pulmonary Rehab from 02/07/2017 in [facility] Cardiac and Pulmonary Rehab  Date  12/19/16  Educator  LB  Instruction Review Code  2- meets goals/outcomes      Diabetes: - Individual verbal and written instruction to review signs/symptoms of diabetes, desired ranges of glucose level fasting, after meals and with exercise. Advice that pre and post exercise glucose checks will be done for 3 sessions at entry of program. Flowsheet Row Pulmonary Rehab from 02/07/2017 in [facility] Cardiac and Pulmonary Rehab  Date  12/19/16  Educator  LB  Instruction Review Code  2- meets goals/outcomes      Chronic Lung Diseases: - Group verbal and written instruction to review new updates, new  respiratory medications, new advancements in procedures and treatments. Provide informative websites and "800" numbers of self-education. Flowsheet Row Pulmonary Rehab from 02/07/2017 in [facility] Cardiac and Pulmonary Rehab  Date  12/27/16  Educator  LB  Instruction Review Code  2- meets goals/outcomes      Lung Procedures: - Group verbal and written instruction to describe testing methods done to diagnose lung disease. Review the outcome of test results. Describe the treatment choices: Pulmonary Function Tests, ABGs and oximetry.   Energy Conservation: - Provide group verbal and written instruction for methods to conserve energy, plan and organize activities. Instruct on pacing techniques, use of adaptive equipment and posture/positioning to relieve shortness of breath.   Triggers: - Group verbal and written instruction to review types of environmental controls: home humidity, furnaces, filters, dust mite/pet prevention, HEPA vacuums. To discuss weather changes, air quality and the benefits of nasal washing. Flowsheet Row Pulmonary Rehab from 02/07/2017 in [facility] Cardiac and Pulmonary Rehab  Date  01/24/17  Educator  LB  Instruction Review Code  2- meets goals/outcomes      Exacerbations: - Group verbal and written instruction to provide: warning signs, infection symptoms, calling MD promptly, preventive modes, and value of vaccinations. Review: effective airway clearance, coughing and/or vibration techniques. Create an Sports administrator. Flowsheet Row Pulmonary Rehab from 02/07/2017 in [facility] Cardiac and Pulmonary Rehab  Date  01/17/17  Educator  LB  Instruction Review Code  2- meets goals/outcomes      Oxygen: - Individual and group verbal and written instruction on oxygen therapy. Includes supplement oxygen, available portable oxygen systems, continuous and intermittent flow rates, oxygen safety, concentrators, and Medicare reimbursement for oxygen.   Respiratory Medications: - Group verbal  and written instruction to review medications for lung disease. Drug class, frequency, complications, importance of spacers, rinsing mouth after steroid MDI's, and proper cleaning methods for nebulizers.   AED/CPR: - Group verbal and written instruction with the use of models to demonstrate the basic use of the AED with the basic ABC's of resuscitation.   Breathing Retraining: - Provides individuals verbal and written instruction on purpose, frequency, and proper technique of diaphragmatic breathing and pursed-lipped breathing. Applies individual practice skills. Flowsheet Row Pulmonary Rehab from 02/07/2017 in [facility] Cardiac and Pulmonary Rehab  Date  12/19/16  Educator  LB  Instruction Review Code  2- meets goals/outcomes      Anatomy and Physiology of the Lungs: - Group verbal and written instruction with the use of models to provide basic lung anatomy and physiology related to function, structure and complications of lung disease. Flowsheet Row Pulmonary Rehab from 02/07/2017 in [facility] Cardiac and Pulmonary Rehab  Date  01/19/17  Educator  CE  Instruction Review Code  2- meets goals/outcomes      Heart Failure: - Group verbal and written instruction on the basics of heart failure: signs/symptoms, treatments, explanation of ejection fraction, enlarged heart and cardiomyopathy. Flowsheet Row Pulmonary Rehab from  02/07/2017 in [facility] Cardiac and Pulmonary Rehab  Date  01/19/17  Educator  CE  Instruction Review Code  2- meets goals/outcomes      Sleep Apnea: - Individual verbal and written instruction to review Obstructive Sleep Apnea. Review of risk factors, methods for diagnosing and types of masks and machines for OSA.   Anxiety: - Provides group, verbal and written instruction on the correlation between heart/lung disease and anxiety, treatment options, and management of anxiety.   Relaxation: - Provides group, verbal and written instruction about the benefits of relaxation for  patients with heart/lung disease. Also provides patients with examples of relaxation techniques. Flowsheet Row Pulmonary Rehab from 02/07/2017 in [facility] Cardiac and Pulmonary Rehab  Date  01/10/17  Educator  [facility]  Instruction Review Code  2- Meets goals/outcomes      Knowledge Questionnaire Score:     Knowledge Questionnaire Score - 12/19/16 1157      Knowledge Questionnaire Score   Pre Score 7/10       Core Components/Risk Factors/Patient Goals at Admission:     Personal Goals and Risk Factors at Admission - 12/19/16 1201      Core Components/Risk Factors/Patient Goals on Admission    Weight Management Yes;Weight Loss   Intervention Weight Management: Develop a combined nutrition and exercise program designed to reach desired caloric intake, while maintaining appropriate intake of nutrient and fiber, sodium and fats, and appropriate energy expenditure required for the weight goal.;Weight Management: Provide education and appropriate resources to help participant work on and attain dietary goals.;Weight Management/Obesity: Establish reasonable short term and long term weight goals.;Obesity: Provide education and appropriate resources to help participant work on and attain dietary goals.   Admit Weight 242 lb 12.8 oz (110.1 kg)   Goal Weight: Short Term 237 lb (107.5 kg)   Goal Weight: Long Term 190 lb (86.2 kg)   Expected Outcomes Short Term: Continue to assess and modify interventions until short term weight is achieved;Long Term: Adherence to nutrition and physical activity/exercise program aimed toward attainment of established weight goal;Weight Maintenance: Understanding of the daily nutrition guidelines, which includes 25-35% calories from fat, 7% or less cal from saturated fats, less than 230m cholesterol, less than 1.5gm of sodium, & 5 or more servings of fruits and vegetables daily;Weight Loss: Understanding of general recommendations for a balanced deficit meal plan, which promotes  1-2 lb weight loss per week and includes a negative energy balance of [phone] kcal/d;Understanding recommendations for meals to include 15-35% energy as protein, 25-35% energy from fat, 35-60% energy from carbohydrates, less than 2083mof dietary cholesterol, 20-35 gm of total fiber daily;Understanding of distribution of calorie intake throughout the day with the consumption of 4-5 meals/snacks   Sedentary Yes  Participates in [employer] [facility] [employer], support and counseling about physical activity/exercise needs.;Develop an individualized exercise prescription for aerobic and resistive training based on initial evaluation findings, risk stratification, comorbidities and participant's personal goals.   Expected Outcomes Achievement of increased cardiorespiratory fitness and enhanced flexibility, muscular endurance and strength shown through measurements of functional capacity and personal statement of participant.   Increase Strength and Stamina Yes   Intervention Provide advice, education, support and counseling about physical activity/exercise needs.;Develop an individualized exercise prescription for aerobic and resistive training based on initial evaluation findings, risk stratification, comorbidities and participant's personal goals.   Expected Outcomes Achievement of increased cardiorespiratory fitness and enhanced flexibility, muscular endurance and strength shown through measurements of functional capacity and  personal statement of participant.   Improve shortness of breath with ADL's Yes   Intervention Provide education, individualized exercise plan and daily activity instruction to help decrease symptoms of SOB with activities of daily living.   Expected Outcomes Short Term: Achieves a reduction of symptoms when performing activities of daily living.   Develop more efficient breathing techniques such as purse lipped breathing and diaphragmatic  breathing; and practicing self-pacing with activity Yes   Intervention Provide education, demonstration and support about specific breathing techniuqes utilized for more efficient breathing. Include techniques such as pursed lipped breathing, diaphragmatic breathing and self-pacing activity.   Expected Outcomes Short Term: Participant will be able to demonstrate and use breathing techniques as needed throughout daily activities.   Increase knowledge of respiratory medications and ability to use respiratory devices properly  Yes  CPAP at [place] education and demonstration as needed of appropriate use of medications, inhalers, and oxygen therapy.   Expected Outcomes Short Term: Achieves understanding of medications use. Understands that oxygen is a medication prescribed by physician. Demonstrates appropriate use of inhaler and oxygen therapy.   Diabetes Yes   Intervention Provide education about signs/symptoms and action to take for hypo/hyperglycemia.;Provide education about proper nutrition, including hydration, and aerobic/resistive exercise prescription along with prescribed medications to achieve blood glucose in normal ranges: Fasting glucose 65-99 mg/dL   Expected Outcomes Short Term: Participant verbalizes understanding of the signs/symptoms and immediate care of hyper/hypoglycemia, proper foot care and importance of medication, aerobic/resistive exercise and nutrition plan for blood glucose control.;Long Term: Attainment of HbA1C < 7%.   Heart Failure Yes   Intervention Provide a combined exercise and nutrition program that is supplemented with education, support and counseling about heart failure. Directed toward relieving symptoms such as shortness of breath, decreased exercise tolerance, and extremity edema.   Expected Outcomes Improve functional capacity of life;Short term: Attendance in program 2-3 days a week with increased exercise capacity. Reported lower sodium  intake. Reported increased fruit and vegetable intake. Reports medication compliance.;Short term: Daily weights obtained and reported for increase. Utilizing diuretic protocols set by physician.;Long term: Adoption of self-care skills and reduction of barriers for early signs and symptoms recognition and intervention leading to self-care maintenance.   Hypertension Yes   Intervention Provide education on lifestyle modifcations including regular physical activity/exercise, weight management, moderate sodium restriction and increased consumption of fresh fruit, vegetables, and low fat dairy, alcohol moderation, and smoking cessation.;Monitor prescription use compliance.   Expected Outcomes Short Term: Continued assessment and intervention until BP is < 140/64m HG in hypertensive participants. < 130/887mHG in hypertensive participants with diabetes, heart failure or chronic kidney disease.;Long Term: Maintenance of blood pressure at goal levels.   Lipids Yes   Intervention Provide education and support for participant on nutrition & aerobic/resistive exercise along with prescribed medications to achieve LDL <7038mHDL >65m22m Expected Outcomes Short Term: Participant states understanding of desired cholesterol values and is compliant with medications prescribed. Participant is following exercise prescription and nutrition guidelines.;Long Term: Cholesterol controlled with medications as prescribed, with individualized exercise RX and with personalized nutrition plan. Value goals: LDL < 70mg68mL > 40 mg.      Core Components/Risk Factors/Patient Goals Review:      Goals and Risk Factor Review    Row Name 01/01/17 1058 01/22/17 1141           Core Components/Risk Factors/Patient Goals Review   Personal Goals Review Weight Management/Obesity;Increase Strength and Stamina;Hypertension;Lipids;Diabetes;Improve shortness of  breath with ADL's Sedentary;Increase Strength and Stamina;Other      Review  The following goals were discussed with the patient. Dianah stated that her last A1C was 6.8 and all the rest of her blood work has been in acceptable ranges. She did state that her SOB sometimes limits her activities or slows her down. She is hoping that his program will help her with that. Before starting [facility] she was going to a Chief of Staff class 3 days a week at [employer], but stated that she does eventually want to work towards using the machines at [employer] as well. Weight loss is a major gaol and exercise guidelines for weight loss were disucssed with the patient.  Benjamin doesnt want to reschedule her RD appointment.  She "loves exercise"  and reports she is doing much better with exercise.  She can get throught her ADLs without as much fatigue and uses a fan to cool off - her Dr feels her getting hot is from a fib.  She reports not sleeping well due to using a CPAP machine.      Expected Outcomes Ailani will consistantly come to class and participate in her exercise program as well as education classes. Using what she learns and being consistant with her attendance will help her see progress in the areas mentioned above.   -         Core Components/Risk Factors/Patient Goals at Discharge (Final Review):      Goals and Risk Factor Review - 01/22/17 1141      Core Components/Risk Factors/Patient Goals Review   Personal Goals Review Sedentary;Increase Strength and Stamina;Other   Review Balinda doesnt want to reschedule her RD appointment.  She "loves exercise"  and reports she is doing much better with exercise.  She can get throught her ADLs without as much fatigue and uses a fan to cool off - her Dr feels her getting hot is from a fib.  She reports not sleeping well due to using a CPAP machine.      ITP Comments:   Comments: 30 Day Review

## 2017-02-13 ENCOUNTER — Ambulatory Visit (INDEPENDENT_AMBULATORY_CARE_PROVIDER_SITE_OTHER): Payer: Medicare Other | Admitting: Family Medicine

## 2017-02-13 ENCOUNTER — Ambulatory Visit: Payer: Medicare Other | Admitting: Family Medicine

## 2017-02-13 ENCOUNTER — Encounter: Payer: Self-pay | Admitting: Family Medicine

## 2017-02-13 DIAGNOSIS — H938X1 Other specified disorders of right ear: Secondary | ICD-10-CM

## 2017-02-13 DIAGNOSIS — R7989 Other specified abnormal findings of blood chemistry: Secondary | ICD-10-CM

## 2017-02-13 LAB — BASIC METABOLIC PANEL WITH GFR
BUN: 17 mg/dL (ref 7–25)
CO2: 26 mmol/L (ref 20–31)
Calcium: 9.3 mg/dL (ref 8.6–10.4)
Chloride: 107 mmol/L (ref 98–110)
Creat: 1.48 mg/dL — ABNORMAL HIGH (ref 0.60–0.93)
GFR, Est African American: 39 mL/min — ABNORMAL LOW (ref >=60)
GFR, Est Non African American: 33 mL/min — ABNORMAL LOW (ref >=60)
Glucose, Bld: 90 mg/dL (ref 65–99)
Potassium: 4.1 mmol/L (ref 3.5–5.3)
Sodium: 142 mmol/L (ref 135–146)

## 2017-02-13 NOTE — Progress Notes (Signed)
Name: [name] [name]   MRN: [id number]    DOB: 03-Apr-1935   Date:02/13/2017       Progress Note  Subjective  Chief Complaint  Chief Complaint  Patient presents with  . Hospitalization Follow-up    Dehydration / low magneisum     HPI  Pt. Presents for follow up from the ER, she was seen a few nights ago with complaints of pounding sensation in right ear, was diagnosed with otalgia and asked to follow up with ENT. She had additional findings of low magnesium and dehydration upon review of blood work. She received fluids in the ER and will check her creatinine levels today.   Past Medical History:  Diagnosis Date  . 1st degree AV block    a. PR 400 msec with Apacing  . Anemia   . Anxiety   . Arthritis    a. shoulders.  . Chest pain    a. 2004 Cath:  reportedly nl;  b. 09/2013 Neg MV.  . Chronic diastolic heart failure ([place])    a. 09/2013 Echo: EF 50-55%, mild LVH, mild to mod MR/TR.  Marland Kitchen Esophageal reflux   . H/O hiatal hernia   . History of UTI   . Hyperlipidemia   . Morbid obesity ([place])   . OSA on CPAP   . PAF (paroxysmal atrial fibrillation) ([place])   . SSS (sick sinus syndrome) ([place])    a. 2008 s/p MDT PPM;  b. 11/2013 Gen change- MDT ADDRL1 Adapta DC PPM, ser # [id number] H.  . Symptomatic PVCs   . Syncope and collapse    a. Felt to be vasovagal.  . Type II diabetes mellitus ([place])   . Unspecified essential hypertension   . Unspecified glaucoma(365.9)     Past Surgical History:  Procedure Laterality Date  . DILATION AND CURETTAGE OF UTERUS    . INCISION AND DRAINAGE OF WOUND Right 03/1998   "leg; it was like a boil" (12/01/2013)  . INSERT / REPLACE / REMOVE PACEMAKER  2008; 12/01/2013   MDT ADDRL1 pacemaker - gen change by Dr Caryl Comes 12/01/2013  . LUNG BIOPSY  2010   [facility]  . PERMANENT PACEMAKER GENERATOR CHANGE N/A 12/01/2013   Procedure: PERMANENT PACEMAKER GENERATOR CHANGE;  Surgeon: Deboraha Sprang, MD;  Location: [facility] CATH LAB;  Service: Cardiovascular;  Laterality: N/A;   . VAGINAL HYSTERECTOMY  1970    Family History  Problem Relation Age of Onset  . Heart disease Father   . Rheum arthritis Father   . Allergies Father   . Liver cancer Mother   . Pancreatic cancer Mother     Social History   Social History  . Marital status: Widowed    Spouse name: N/A  . Number of children: Y  . Years of education: N/A   Occupational History  . retired    Social History Main Topics  . Smoking status: Former Smoker    Packs/day: 0.50    Years: 34.00    Types: Cigarettes    Quit date: 08/15/1989  . Smokeless tobacco: Never Used  . Alcohol use No  . Drug use: No  . Sexual activity: No   Other Topics Concern  . Not on file   Social History Narrative   Retired. Widowed. Regularly exercises.      Current Outpatient Prescriptions:  .  acetaminophen (TYLENOL) 500 MG tablet, Take 500 mg by mouth every 6 (six) hours as needed., Disp: , Rfl:  .  ALPRAZolam (XANAX) 0.5 MG tablet, Take  1 tablet (0.5 mg total) by mouth daily as needed., Disp: 90 tablet, Rfl: 0 .  amLODipine (NORVASC) 10 MG tablet, TAKE 1 TABLET (10 MG TOTAL) BY MOUTH DAILY., Disp: 90 tablet, Rfl: 0 .  atorvastatin (LIPITOR) 20 MG tablet, TAKE 1 TABLET (20 MG TOTAL) BY MOUTH DAILY., Disp: 90 tablet, Rfl: 0 .  Brinzolamide-Brimonidine (SIMBRINZA) 1-0.2 % SUSP, Place 1 drop into both eyes 2 (two) times daily., Disp: 1 Bottle, Rfl: 1 .  carvedilol (COREG) 12.5 MG tablet, Take 1 tablet (12.5 mg) in the am and 1 and 1/2 tablet in the evening., Disp: , Rfl:  .  carvedilol (COREG) 12.5 MG tablet, Take 1 tablet in the morning with breakfast and 2 tablets pm daily by mouth in the evening before bed., Disp: 90 tablet, Rfl: 3 .  doxycycline (VIBRA-TABS) 100 MG tablet, Take 1 tablet (100 mg total) by mouth 2 (two) times daily., Disp: 14 tablet, Rfl: 0 .  esomeprazole (NEXIUM) 40 MG capsule, TAKE 1 CAPSULE (40 MG TOTAL) BY MOUTH DAILY., Disp: 90 capsule, Rfl: 1 .  flecainide (TAMBOCOR) 50 MG tablet, Take 1  tablet (50 mg total) by mouth 2 (two) times daily., Disp: 60 tablet, Rfl: 6 .  fluticasone (FLONASE) 50 MCG/ACT nasal spray, PLACE 2 SPRAYS INTO BOTH NOSTRILS DAILY., Disp: 16 g, Rfl: 2 .  furosemide (LASIX) 20 MG tablet, Take 1 tablet (20 mg total) by mouth daily., Disp: 90 tablet, Rfl: 0 .  glipiZIDE (GLUCOTROL) 10 MG tablet, TAKE 1 TABLET (10 MG TOTAL) BY MOUTH 2 (TWO) TIMES DAILY BEFORE A MEAL., Disp: 180 tablet, Rfl: 0 .  metFORMIN (GLUCOPHAGE-XR) 500 MG 24 hr tablet, TAKE 1 TABLET (500 MG TOTAL) BY MOUTH 2 (TWO) TIMES DAILY., Disp: 180 tablet, Rfl: 0 .  mometasone (ELOCON) 0.1 % cream, Apply topically daily. Apply to affected area as directed two times a day, Disp: 45 g, Rfl: 0 .  Multiple Vitamins-Minerals (CENTRUM SILVER PO), Take by mouth daily., Disp: , Rfl:  .  olmesartan (BENICAR) 40 MG tablet, TAKE 1 TABLET (40 MG TOTAL) BY MOUTH DAILY., Disp: 90 tablet, Rfl: 0 .  TRADJENTA 5 MG TABS tablet, TAKE 1 TABLET (5 MG TOTAL) BY MOUTH DAILY., Disp: 30 tablet, Rfl: 2 .  travoprost, benzalkonium, (TRAVATAN) 0.004 % ophthalmic solution, Place 1 drop into both eyes at bedtime. , Disp: , Rfl:  .  warfarin (COUMADIN) 3 MG tablet, TAKE 1-1.5 TABLETS DAILY AT 6 PM. TAKE 1 TAB ON TUES, THURS, SAT, SUN. TAKE 4.5 MG ON MON, WED, FRI, Disp: 100 tablet, Rfl: 3 .  gentamicin (GARAMYCIN) 0.3 % ophthalmic solution, , Disp: , Rfl:  .  terbinafine (LAMISIL) 250 MG tablet, , Disp: , Rfl:   Allergies  Allergen Reactions  . Metoprolol Other (See Comments)    Bad dreams Bad dreams Patient stated that she had suicidal thoughts while taking this medication.  . Diltiazem Other (See Comments)    Patient stated that she had bad dreams with this medication  . Levofloxacin In D5w Nausea Only and Nausea And Vomiting  . Levofloxacin Nausea Only and Nausea And Vomiting     ROS Please see history of present illness for complete discussion of ROS  Objective  Vitals:   02/13/17 1407  BP: 138/72  Pulse: 80   Resp: 16  Temp: 98 F (36.7 C)  TempSrc: Oral  SpO2: 94%  Weight: 247 lb 12.8 oz (112.4 kg)  Height: 5\' 5"  (1.651 m)    Physical Exam  Constitutional:  She is oriented to person, place, and time and well-developed, well-nourished, and in no distress.  HENT:  Head: Normocephalic and atraumatic.  Cardiovascular: Normal rate, regular rhythm and normal heart sounds.   No murmur heard. Pulmonary/Chest: Effort normal and breath sounds normal.  Neurological: She is alert and oriented to person, place, and time.  Psychiatric: Mood, memory, affect and judgment normal.  Nursing note and vitals reviewed.      Assessment & Plan  1. Pounding noise in right ear Suspect tinnitus, patient will be referred to ENT - Ambulatory referral to ENT  2. Hypomagnesemia Obtain magnesium levels and follow-up - Magnesium  3. Elevated serum creatinine Likely due to acute renal injury because of dehydration. Obtain BMP and follow-up - BASIC METABOLIC PANEL WITH GFR   Samyak Sackmann [facility] 02/13/2017 2:20 PM

## 2017-02-14 LAB — MAGNESIUM: Magnesium: 1.7 mg/dL (ref 1.5–2.5)

## 2017-02-15 ENCOUNTER — Telehealth: Payer: Self-pay

## 2017-02-15 NOTE — Telephone Encounter (Signed)
Sees kidney Dr tomorrow and will get clearance - plans to return Monday.

## 2017-02-19 ENCOUNTER — Encounter: Payer: Medicare Other | Admitting: *Deleted

## 2017-02-19 DIAGNOSIS — I5032 Chronic diastolic (congestive) heart failure: Secondary | ICD-10-CM | POA: Diagnosis not present

## 2017-02-19 NOTE — Progress Notes (Signed)
Daily Session Note  Patient Details  Name: MARKEESHA CHAR MRN: [id number] Date of Birth: 02/14/1935 Referring Provider:     Pulmonary Rehab from 12/19/2016 in [facility] Cardiac and Pulmonary Rehab  Referring Provider  G[place]      Encounter Date: 02/19/2017  Check In:     Session Check In - 02/19/17 1111      Check-In   Location [facility]-Cardiac & Pulmonary Rehab   Staff Present Carson Myrtle, BS, RRT, Respiratory Therapist;Ferry Matthis Amedeo Plenty, BS, ACSM CEP, Exercise Physiologist;Amanda Oletta Darter, BA, ACSM CEP, Exercise Physiologist   Supervising physician immediately available to respond to emergencies [facility] immediately available ER MD   Physician(s) Reita Cliche and Alfred Levins   Medication changes reported     No   Fall or balance concerns reported    No   Warm-up and Cool-down Performed as group-led Location manager Performed Yes   VAD Patient? No     Pain Assessment   Currently in Pain? No/denies   Multiple Pain Sites No         History  Smoking Status  . Former Smoker  . Packs/day: 0.50  . Years: 34.00  . Types: Cigarettes  . Quit date: 08/15/1989  Smokeless Tobacco  . Never Used    Goals Met:  Proper associated with RPD/PD & O2 Sat Independence with exercise equipment Exercise tolerated well Strength training completed today  Goals Unmet:  Not Applicable  Comments: Pt able to follow exercise prescription today without complaint.  Will continue to monitor for progression.    Dr. Emily Filbert is Medical Director for [place] and [facility] Pulmonary Rehabilitation.

## 2017-02-23 ENCOUNTER — Encounter: Payer: Medicare Other | Admitting: *Deleted

## 2017-02-23 DIAGNOSIS — I5032 Chronic diastolic (congestive) heart failure: Secondary | ICD-10-CM | POA: Diagnosis not present

## 2017-02-23 NOTE — Progress Notes (Signed)
Daily Session Note  Patient Details  Name: DEMEISHA GERAGHTY MRN: [id number] Date of Birth: 1935/02/16 Referring Provider:     Pulmonary Rehab from 12/19/2016 in [facility] Cardiac and Pulmonary Rehab  Referring Provider  Gollan      Encounter Date: 02/23/2017  Check In:     Session Check In - 02/23/17 1100      Check-In   Location [facility]-Cardiac & Pulmonary Rehab   Staff Present Nyoka Cowden, RN, BSN, Walden Field, BS, RRT, Respiratory Therapist;Other  Darel Hong RN BSN   Supervising physician immediately available to respond to emergencies [facility] immediately available ER MD   Physician(s) Drs. Alfred Levins and Mercer   Medication changes reported     No   Fall or balance concerns reported    No   Tobacco Cessation No Change   Warm-up and Cool-down Performed as group-led Location manager Performed Yes   VAD Patient? No     Pain Assessment   Currently in Pain? No/denies   Pain Score 0-No pain   Multiple Pain Sites No         History  Smoking Status  . Former Smoker  . Packs/day: 0.50  . Years: 34.00  . Types: Cigarettes  . Quit date: 08/15/1989  Smokeless Tobacco  . Never Used    Goals Met:  Proper associated with RPD/PD & O2 Sat Independence with exercise equipment Using PLB without cueing & demonstrates good technique Exercise tolerated well Strength training completed today  Goals Unmet:  Not Applicable  Comments: Pt able to follow exercise prescription today without complaint.  Will continue to monitor for progression.    Dr. Emily Filbert is Medical Director for [place] and [facility].

## 2017-02-26 ENCOUNTER — Encounter: Payer: Medicare Other | Admitting: *Deleted

## 2017-02-26 ENCOUNTER — Ambulatory Visit: Payer: Medicare Other | Admitting: Podiatry

## 2017-02-26 DIAGNOSIS — I5032 Chronic diastolic (congestive) heart failure: Secondary | ICD-10-CM | POA: Diagnosis not present

## 2017-02-26 NOTE — Progress Notes (Signed)
Daily Session Note  Patient Details  Name: [name] [name] MRN: [id number] Date of Birth: 1934/12/28 Referring Provider:     Pulmonary Rehab from 12/19/2016 in [facility] Cardiac and Pulmonary Rehab  Referring Provider  Gollan      Encounter Date: 02/26/2017  Check In:     Session Check In - 02/26/17 1016      Check-In   Location [facility]-Cardiac & Pulmonary Rehab   Staff Present Carson Myrtle, BS, RRT, Respiratory Therapist;Melik Blancett Amedeo Plenty, BS, ACSM CEP, Exercise Physiologist;Amanda Oletta Darter, BA, ACSM CEP, Exercise Physiologist   Supervising physician immediately available to respond to emergencies [facility] immediately available ER MD   Physician(s) Clearnce Hasten and Jimmye Norman   Medication changes reported     No   Fall or balance concerns reported    No   Warm-up and Cool-down Performed as group-led instruction   Resistance Training Performed Yes   VAD Patient? No     Pain Assessment   Currently in Pain? No/denies   Multiple Pain Sites No         History  Smoking Status  . Former Smoker  . Packs/day: 0.50  . Years: 34.00  . Types: Cigarettes  . Quit date: 08/15/1989  Smokeless Tobacco  . Never Used    Goals Met:  Proper associated with RPD/PD & O2 Sat Independence with exercise equipment Exercise tolerated well Personal goals reviewed Strength training completed today  Goals Unmet:  Not Applicable  Comments: Pt able to follow exercise prescription today without complaint.  Will continue to monitor for progression.    Dr. Emily Filbert is Medical Director for [place] and [facility].

## 2017-02-28 ENCOUNTER — Encounter: Payer: Self-pay | Admitting: Dietician

## 2017-02-28 DIAGNOSIS — I5032 Chronic diastolic (congestive) heart failure: Secondary | ICD-10-CM | POA: Diagnosis not present

## 2017-02-28 NOTE — Progress Notes (Signed)
Daily Session Note  Patient Details  Name: [name] [name] MRN: [id number] Date of Birth: 12/14/1934 Referring Provider:     Pulmonary Rehab from 12/19/2016 in [facility] Cardiac and Pulmonary Rehab  Referring Provider  Gollan      Encounter Date: 02/28/2017  Check In:     Session Check In - 02/28/17 1222      Check-In   Location [facility]-Cardiac & Pulmonary Rehab   Staff Present Carson Myrtle, BS, RRT, Respiratory Lennie Hummer, MA, ACSM RCEP, Exercise Physiologist;Kayzlee Wirtanen Oletta Darter, BA, ACSM CEP, Exercise Physiologist   Supervising physician immediately available to respond to emergencies [facility] immediately available ER MD   Physician(s) Kerman Passey and Jimmye Norman   Medication changes reported     No   Fall or balance concerns reported    No   Warm-up and Cool-down Performed as group-led instruction   Resistance Training Performed Yes   VAD Patient? No     Pain Assessment   Currently in Pain? No/denies           Exercise Prescription Changes - 02/28/17 1200      Response to Exercise   Blood Pressure (Admit) 126/70   Blood Pressure (Exercise) 130/60   Blood Pressure (Exit) 134/66   Heart Rate (Admit) 76 bpm   Heart Rate (Exercise) 95 bpm   Heart Rate (Exit) 71 bpm   Oxygen Saturation (Admit) 96 %   Oxygen Saturation (Exercise) 95 %   Oxygen Saturation (Exit) 97 %   Rating of Perceived Exertion (Exercise) 15   Perceived Dyspnea (Exercise) 2   Duration Progress to 45 minutes of aerobic exercise without signs/symptoms of physical distress   Intensity THRR unchanged     Progression   Progression Continue to progress workloads to maintain intensity without signs/symptoms of physical distress.     Resistance Training   Training Prescription Yes   Weight 2   Reps 10-15     Interval Training   Interval Training No     Treadmill   MPH 1.4   Grade 0   Minutes 15   METs 2.07     NuStep   Level 3   Minutes 15   METs 2     Biostep-RELP   Level 3   Minutes  15   METs 2      History  Smoking Status  . Former Smoker  . Packs/day: 0.50  . Years: 34.00  . Types: Cigarettes  . Quit date: 08/15/1989  Smokeless Tobacco  . Never Used    Goals Met:  Proper associated with RPD/PD & O2 Sat Independence with exercise equipment Exercise tolerated well Strength training completed today  Goals Unmet:  Not Applicable  Comments: Pt able to follow exercise prescription today without complaint.  Will continue to monitor for progression.    Dr. Emily Filbert is Medical Director for [place] and [facility].

## 2017-03-01 ENCOUNTER — Ambulatory Visit (INDEPENDENT_AMBULATORY_CARE_PROVIDER_SITE_OTHER): Payer: Medicare Other | Admitting: *Deleted

## 2017-03-01 DIAGNOSIS — I495 Sick sinus syndrome: Secondary | ICD-10-CM

## 2017-03-01 NOTE — Progress Notes (Signed)
Remote pacemaker transmission.   

## 2017-03-02 ENCOUNTER — Encounter: Payer: Self-pay | Admitting: Cardiology

## 2017-03-02 DIAGNOSIS — I5032 Chronic diastolic (congestive) heart failure: Secondary | ICD-10-CM | POA: Diagnosis not present

## 2017-03-02 LAB — CUP PACEART REMOTE DEVICE CHECK
Battery Impedance: 180 Ohm
Battery Remaining Longevity: 123 mo
Battery Voltage: 2.79 V
Brady Statistic AP VP Percent: 0 %
Brady Statistic AP VS Percent: 98 %
Brady Statistic AS VP Percent: 0 %
Brady Statistic AS VS Percent: 2 %
Date Time Interrogation Session: 20180329112004
Implantable Lead Implant Date: 20080117
Implantable Lead Implant Date: 20080117
Implantable Lead Location: 753859
Implantable Lead Location: 753860
Implantable Lead Model: 5076
Implantable Lead Model: 5076
Implantable Pulse Generator Implant Date: 20141229
Lead Channel Impedance Value: 417 Ohm
Lead Channel Impedance Value: 666 Ohm
Lead Channel Pacing Threshold Amplitude: 0.625 V
Lead Channel Pacing Threshold Amplitude: 0.875 V
Lead Channel Pacing Threshold Pulse Width: 0.4 ms
Lead Channel Pacing Threshold Pulse Width: 0.4 ms
Lead Channel Setting Pacing Amplitude: 2 V
Lead Channel Setting Pacing Amplitude: 2.5 V
Lead Channel Setting Pacing Pulse Width: 0.4 ms
Lead Channel Setting Sensing Sensitivity: 4 mV

## 2017-03-02 NOTE — Progress Notes (Signed)
Letter  

## 2017-03-02 NOTE — Progress Notes (Signed)
Daily Session Note  Patient Details  Name: [name] [name] MRN: [id number] Date of Birth: March 14, 1935 Referring Provider:     Pulmonary Rehab from 12/19/2016 in [facility] Cardiac and Pulmonary Rehab  Referring Provider  Gollan      Encounter Date: 03/02/2017  Check In:     Session Check In - 03/02/17 1031      Check-In   Location [facility]-Cardiac & Pulmonary Rehab   Staff Present Nyoka Cowden, RN, BSN, Kela Millin, BA, ACSM CEP, Exercise Physiologist   Supervising physician immediately available to respond to emergencies [facility] immediately available ER MD   Physician(s) Drs. Malinda and Lord   Medication changes reported     No   Fall or balance concerns reported    No   Warm-up and Cool-down Performed as group-led Location manager Performed Yes   VAD Patient? No     Pain Assessment   Currently in Pain? No/denies   Multiple Pain Sites No         History  Smoking Status  . Former Smoker  . Packs/day: 0.50  . Years: 34.00  . Types: Cigarettes  . Quit date: 08/15/1989  Smokeless Tobacco  . Never Used    Goals Met:  Proper associated with RPD/PD & O2 Sat Independence with exercise equipment Exercise tolerated well Strength training completed today  Goals Unmet:  Not Applicable  Comments: Pt able to follow exercise prescription today without complaint.  Will continue to monitor for progression.    Dr. Emily Filbert is Medical Director for [place] and [facility].

## 2017-03-05 ENCOUNTER — Encounter: Payer: Medicare Other | Attending: Cardiovascular Disease | Admitting: *Deleted

## 2017-03-05 DIAGNOSIS — Z781 Physical restraint status: Secondary | ICD-10-CM | POA: Insufficient documentation

## 2017-03-05 DIAGNOSIS — I5032 Chronic diastolic (congestive) heart failure: Secondary | ICD-10-CM | POA: Diagnosis present

## 2017-03-05 DIAGNOSIS — R0602 Shortness of breath: Secondary | ICD-10-CM | POA: Insufficient documentation

## 2017-03-05 NOTE — Progress Notes (Signed)
Daily Session Note  Patient Details  Name: [name] [name] MRN: [id number] Date of Birth: [birth year] Referring Provider:     Pulmonary Rehab from 12/19/2016 in [facility] Cardiac and Pulmonary Rehab  Referring Provider  Gollan      Encounter Date: 03/05/2017  Check In:     Session Check In - 03/05/17 1214      Check-In   Location [facility]-Cardiac & Pulmonary Rehab   Staff Present Nada Maclachlan, BA, ACSM CEP, Exercise Physiologist;Laureen Owens Shark, BS, RRT, Respiratory Bertis Ruddy, BS, ACSM CEP, Exercise Physiologist   Supervising physician immediately available to respond to emergencies [facility] immediately available ER MD   Physician(s) Quentin Cornwall and Jimmye Norman    Medication changes reported     No   Fall or balance concerns reported    No   Warm-up and Cool-down Performed as group-led instruction   Resistance Training Performed Yes   VAD Patient? No     Pain Assessment   Currently in Pain? No/denies   Multiple Pain Sites No         History  Smoking Status  . Former Smoker  . Packs/day: 0.50  . Years: 34.00  . Types: Cigarettes  . Quit date: 08/15/1989  Smokeless Tobacco  . Never Used    Goals Met:  Proper associated with RPD/PD & O2 Sat Independence with exercise equipment Exercise tolerated well Strength training completed today  Goals Unmet:  Not Applicable  Comments: Pt able to follow exercise prescription today without complaint.  Will continue to monitor for progression.    Dr. Emily Filbert is Medical Director for [place] and [facility].

## 2017-03-07 DIAGNOSIS — H9313 Tinnitus, bilateral: Secondary | ICD-10-CM | POA: Insufficient documentation

## 2017-03-07 DIAGNOSIS — H9193 Unspecified hearing loss, bilateral: Secondary | ICD-10-CM | POA: Insufficient documentation

## 2017-03-09 ENCOUNTER — Encounter: Payer: Medicare Other | Admitting: *Deleted

## 2017-03-09 DIAGNOSIS — I5032 Chronic diastolic (congestive) heart failure: Secondary | ICD-10-CM

## 2017-03-09 NOTE — Progress Notes (Signed)
Daily Session Note  Patient Details  Name: [name] [name] MRN: [id number] Date of Birth: 08/29/1935 Referring Provider:     Pulmonary Rehab from 12/19/2016 in [facility] Cardiac and Pulmonary Rehab  Referring Provider  Gollan      Encounter Date: 03/09/2017  Check In:     Session Check In - 03/09/17 1114      Check-In   Location [facility]-Cardiac & Pulmonary Rehab   Staff Present Gerlene Burdock, RN, Vickki Hearing, BA, ACSM CEP, Exercise Physiologist;Other  Darel Hong, RN   Supervising physician immediately available to respond to emergencies [facility] immediately available ER MD   Physician(s) Dr. Corky Downs and Dr. Mable Paris   Medication changes reported     No   Fall or balance concerns reported    No   Warm-up and Cool-down Performed on first and last piece of equipment   Resistance Training Performed Yes   VAD Patient? No     Pain Assessment   Currently in Pain? No/denies         History  Smoking Status  . Former Smoker  . Packs/day: 0.50  . Years: 34.00  . Types: Cigarettes  . Quit date: 08/15/1989  Smokeless Tobacco  . Never Used    Goals Met:  Proper associated with RPD/PD & O2 Sat Exercise tolerated well  Goals Unmet:  Not Applicable  Comments: Pt able to follow exercise prescription today without complaint.  Will continue to monitor for progression.   Dr. Emily Filbert is Medical Director for [place] and [facility].

## 2017-03-12 ENCOUNTER — Encounter: Payer: Self-pay | Admitting: Respiratory Therapy

## 2017-03-12 DIAGNOSIS — I5032 Chronic diastolic (congestive) heart failure: Secondary | ICD-10-CM | POA: Diagnosis not present

## 2017-03-12 NOTE — Progress Notes (Signed)
Pulmonary Individual Treatment Plan  Patient Details  Name: [name] [name] MRN: [id number] Date of Birth: 01/08/1935 Referring Provider:     Pulmonary Rehab from 12/19/2016 in [facility] Cardiac and Pulmonary Rehab  Referring Provider  Gollan      Initial Encounter Date:    Pulmonary Rehab from 12/19/2016 in [facility] Cardiac and Pulmonary Rehab  Date  12/19/16  Referring Provider  Rockey Situ      Visit Diagnosis: Chronic diastolic congestive heart failure ([place])  Patient's Home Medications on Admission:  Current Outpatient Prescriptions:    acetaminophen (TYLENOL) 500 MG tablet, Take 500 mg by mouth every 6 (six) hours as needed., Disp: , Rfl:    ALPRAZolam (XANAX) 0.5 MG tablet, Take 1 tablet (0.5 mg total) by mouth daily as needed., Disp: 90 tablet, Rfl: 0   amLODipine (NORVASC) 10 MG tablet, TAKE 1 TABLET (10 MG TOTAL) BY MOUTH DAILY., Disp: 90 tablet, Rfl: 0   atorvastatin (LIPITOR) 20 MG tablet, TAKE 1 TABLET (20 MG TOTAL) BY MOUTH DAILY., Disp: 90 tablet, Rfl: 0   Brinzolamide-Brimonidine (SIMBRINZA) 1-0.2 % SUSP, Place 1 drop into both eyes 2 (two) times daily., Disp: 1 Bottle, Rfl: 1   carvedilol (COREG) 12.5 MG tablet, Take 1 tablet (12.5 mg) in the am and 1 and 1/2 tablet in the evening., Disp: , Rfl:    carvedilol (COREG) 12.5 MG tablet, Take 1 tablet in the morning with breakfast and 2 tablets pm daily by mouth in the evening before bed., Disp: 90 tablet, Rfl: 3   doxycycline (VIBRA-TABS) 100 MG tablet, Take 1 tablet (100 mg total) by mouth 2 (two) times daily., Disp: 14 tablet, Rfl: 0   esomeprazole (NEXIUM) 40 MG capsule, TAKE 1 CAPSULE (40 MG TOTAL) BY MOUTH DAILY., Disp: 90 capsule, Rfl: 1   flecainide (TAMBOCOR) 50 MG tablet, Take 1 tablet (50 mg total) by mouth 2 (two) times daily., Disp: 60 tablet, Rfl: 6   fluticasone (FLONASE) 50 MCG/ACT nasal spray, PLACE 2 SPRAYS INTO BOTH NOSTRILS DAILY., Disp: 16 g, Rfl: 2   furosemide (LASIX) 20 MG tablet, Take 1 tablet (20 mg  total) by mouth daily., Disp: 90 tablet, Rfl: 0   gentamicin (GARAMYCIN) 0.3 % ophthalmic solution, , Disp: , Rfl:    glipiZIDE (GLUCOTROL) 10 MG tablet, TAKE 1 TABLET (10 MG TOTAL) BY MOUTH 2 (TWO) TIMES DAILY BEFORE A MEAL., Disp: 180 tablet, Rfl: 0   metFORMIN (GLUCOPHAGE-XR) 500 MG 24 hr tablet, TAKE 1 TABLET (500 MG TOTAL) BY MOUTH 2 (TWO) TIMES DAILY., Disp: 180 tablet, Rfl: 0   mometasone (ELOCON) 0.1 % cream, Apply topically daily. Apply to affected area as directed two times a day, Disp: 45 g, Rfl: 0   Multiple Vitamins-Minerals (CENTRUM SILVER PO), Take by mouth daily., Disp: , Rfl:    olmesartan (BENICAR) 40 MG tablet, TAKE 1 TABLET (40 MG TOTAL) BY MOUTH DAILY., Disp: 90 tablet, Rfl: 0   terbinafine (LAMISIL) 250 MG tablet, , Disp: , Rfl:    TRADJENTA 5 MG TABS tablet, TAKE 1 TABLET (5 MG TOTAL) BY MOUTH DAILY., Disp: 30 tablet, Rfl: 2   travoprost, benzalkonium, (TRAVATAN) 0.004 % ophthalmic solution, Place 1 drop into both eyes at bedtime. , Disp: , Rfl:    warfarin (COUMADIN) 3 MG tablet, TAKE 1-1.5 TABLETS DAILY AT 6 PM. TAKE 1 TAB ON TUES, THURS, SAT, SUN. TAKE 4.5 MG ON MON, WED, FRI, Disp: 100 tablet, Rfl: 3  Past Medical History: Past Medical History:  Diagnosis Date  1st degree AV block    a. PR 400 msec with Apacing   Anemia    Anxiety    Arthritis    a. shoulders.   Chest pain    a. 2004 Cath:  reportedly nl;  b. 09/2013 Neg MV.   Chronic diastolic heart failure ([place])    a. 09/2013 Echo: EF 50-55%, mild LVH, mild to mod MR/TR.   Esophageal reflux    H/O hiatal hernia    History of UTI    Hyperlipidemia    Morbid obesity ([place])    OSA on CPAP    PAF (paroxysmal atrial fibrillation) (HCC)    SSS (sick sinus syndrome) ([place])    a. 2008 s/p MDT PPM;  b. 11/2013 Gen change- MDT ADDRL1 Adapta DC PPM, ser # [id number].   Symptomatic PVCs    Syncope and collapse    a. Felt to be vasovagal.   Type II diabetes mellitus (HCC)    Unspecified  essential hypertension    Unspecified glaucoma(365.9)     Tobacco Use: History  Smoking Status   Former Smoker   Packs/day: 0.50   Years: 34.00   Types: Cigarettes   Quit date: 08/15/1989  Smokeless Tobacco   Never Used    Labs: Recent Review Flowsheet Data    Labs for ITP Cardiac and Pulmonary Rehab Latest Ref Rng & Units 12/16/2015 03/15/2016 07/28/2016 11/01/2016 02/02/2017   Cholestrol <200 mg/dL 153 144 - 156 -   LDLCALC <100 mg/dL 72 67 - 79 -   HDL >50 mg/dL 61 49 - 58 -   Trlycerides <150 mg/dL 100 138 - 93 -   Hemoglobin A1c - 7.8 8.2 6.8 6.8 6.7       ADL UCSD:     Pulmonary Assessment Scores    Row Name 12/19/16 1158 03/02/17 1058       ADL UCSD   ADL Phase Entry Mid    SOB Score total 21 12    Rest 1 0    Walk 0 1    Stairs 2 1    Bath 0 1    Dress 0 0    Shop 0 0       Pulmonary Function Assessment:     Pulmonary Function Assessment - 12/19/16 1157      Initial Spirometry Results   FVC% 85 %   FEV1% 101 %   FEV1/FVC Ratio 91.51     Breath   Bilateral Breath Sounds Clear   Shortness of Breath Yes      Exercise Target Goals:    Exercise Program Goal: Individual exercise prescription set with THRR, safety & activity barriers. Participant demonstrates ability to understand and report RPE using BORG scale, to self-measure pulse accurately, and to acknowledge the importance of the exercise prescription.  Exercise Prescription Goal: Starting with aerobic activity 30 plus minutes a day, 3 days per week for initial exercise prescription. Provide home exercise prescription and guidelines that participant acknowledges understanding prior to discharge.  Activity Barriers & Risk Stratification:     Activity Barriers & Cardiac Risk Stratification - 12/19/16 1157      Activity Barriers & Cardiac Risk Stratification   Activity Barriers Arthritis;Back Problems;Joint Problems;Shortness of Breath;Deconditioning;Muscular Weakness      6  Minute Walk:     6 Minute Walk    Row Name 12/19/16 1201 02/26/17 1250       6 Minute Walk   Phase  -- Mid Program    Distance 965 feet  985 feet    Walk Time 6 minutes 6 minutes    # of Rest Breaks 0 0    MPH 1.82 1.87    METS 2.38 2.45    RPE 15 13    Perceived Dyspnea  2 2    VO2 Peak 5.43 8.5    Symptoms No Yes (comment)  back tightness    Resting HR 68 bpm 83 bpm    Resting BP 122/66 120/76    Max Ex. HR 113 bpm 107 bpm    Max Ex. BP 172/72 152/80      Interval HR   Baseline HR 68 83    1 Minute HR 110 105    2 Minute HR 104 107    3 Minute HR 102 104    4 Minute HR  -- 103    5 Minute HR 98 102    6 Minute HR 113 102    2 Minute Post HR  -- 91    Interval Heart Rate? Yes Yes      Interval Oxygen   Interval Oxygen? Yes Yes    Baseline Oxygen Saturation % 97 % 97 %    Baseline Liters of Oxygen  -- 0 L    1 Minute Oxygen Saturation % 96 % 96 %    1 Minute Liters of Oxygen  -- 0 L    2 Minute Oxygen Saturation % 94 % 97 %    2 Minute Liters of Oxygen  -- 0 L    3 Minute Oxygen Saturation % 95 % 97 %    3 Minute Liters of Oxygen  -- 0 L    4 Minute Oxygen Saturation %  -- 95 %    4 Minute Liters of Oxygen  -- 0 L    5 Minute Oxygen Saturation % 96 % 95 %    5 Minute Liters of Oxygen  -- 0 L    6 Minute Oxygen Saturation % 95 % 95 %    6 Minute Liters of Oxygen  -- 0 L    2 Minute Post Oxygen Saturation %  -- 98 %    2 Minute Post Liters of Oxygen  -- 0 L      Oxygen Initial Assessment:   Oxygen Re-Evaluation:   Oxygen Discharge (Final Oxygen Re-Evaluation):   Initial Exercise Prescription:     Initial Exercise Prescription - 12/19/16 1200      Date of Initial Exercise RX and Referring Provider   Date 12/19/16   Referring Provider Gollan     Treadmill   MPH 1.5   Grade 0.5   Minutes 15   METs 2.25     Recumbant Bike   Level 1   RPM 60   Minutes 15   METs 2     NuStep   Level 3   Minutes 15   METs 2.25     Biostep-RELP   Level 2    Minutes 15   METs 2     Prescription Details   Frequency (times per week) 3   Duration Progress to 45 minutes of aerobic exercise without signs/symptoms of physical distress     Intensity   THRR 40-80% of Max Heartrate 97-126   Ratings of Perceived Exertion 11-13   Perceived Dyspnea 0-4     Progression   Progression Continue to progress workloads to maintain intensity without signs/symptoms of physical distress.     Resistance Training   Training Prescription Yes  Weight 3   Reps 10-15      Perform Capillary Blood Glucose checks as needed.  Exercise Prescription Changes:     Exercise Prescription Changes    Row Name 12/27/16 1200 01/05/17 0900 01/10/17 1100 01/18/17 1100 02/02/17 1200     Response to Exercise   Blood Pressure (Admit) 112/54 134/70  -- 130/66 134/60   Blood Pressure (Exercise) 124/66 148/82  -- 128/70 126/84   Blood Pressure (Exit) 108/50 120/74  -- 108/58 124/64   Heart Rate (Admit) 79 bpm 79 bpm  -- 67 bpm 80 bpm   Heart Rate (Exercise) 77 bpm 68 bpm  -- 100 bpm 76 bpm   Heart Rate (Exit) 71 bpm 60 bpm  -- 69 bpm 67 bpm   Oxygen Saturation (Admit) 98 % 98 %  -- 97 % 97 %   Oxygen Saturation (Exercise) 98 % 98 %  -- 96 % 97 %   Oxygen Saturation (Exit) 100 % 99 %  -- 99 % 99 %   Rating of Perceived Exertion (Exercise) 13 13  -- 11 13   Perceived Dyspnea (Exercise) 2 3  -- 2 2   Comments  --  -- Home Exercise Guidelines given 01/10/17  --  --   Duration Progress to 45 minutes of aerobic exercise without signs/symptoms of physical distress Progress to 45 minutes of aerobic exercise without signs/symptoms of physical distress Progress to 45 minutes of aerobic exercise without signs/symptoms of physical distress Progress to 45 minutes of aerobic exercise without signs/symptoms of physical distress Progress to 45 minutes of aerobic exercise without signs/symptoms of physical distress   Intensity THRR unchanged THRR unchanged THRR unchanged THRR unchanged THRR  unchanged     Progression   Progression Continue to progress workloads to maintain intensity without signs/symptoms of physical distress. Continue to progress workloads to maintain intensity without signs/symptoms of physical distress. Continue to progress workloads to maintain intensity without signs/symptoms of physical distress. Continue to progress workloads to maintain intensity without signs/symptoms of physical distress. Continue to progress workloads to maintain intensity without signs/symptoms of physical distress.     Resistance Training   Training Prescription _0    Weight _1 Reps 10-15 10-15 10-15 10-15  --     Treadmill   MPH 1  --  -- 1.7  --   Grade 0  --  -- 0  --   Minutes 15  --  -- 15  --   METs 1.77  --  -- 2.3  --     NuStep   Level _2 Minutes _3 METs  --  --  -- 2.3 2.3     Biostep-RELP   Level  -- _4 Minutes  -- _5 METs  -- 2 2  -- 2     Home Exercise Plan   Plans to continue exercise at  --  -- [employer] (comment)  [employer] at [employer] and walking  --  --   Frequency  --  -- Add 2 additional days to program exercise sessions.  --  --     Exercise Review   Progression  --  --  -- Yes  --   Row Name 02/15/17 1300 02/28/17 1200           Response to Exercise   Blood Pressure (Admit)  126/70 126/70      Blood Pressure (Exercise) 158/80 130/60      Blood Pressure (Exit) 136/70 134/66      Heart Rate (Admit) 63 bpm 76 bpm      Heart Rate (Exercise) 85 bpm 95 bpm      Heart Rate (Exit) 70 bpm 71 bpm      Oxygen Saturation (Admit) 96 % 96 %      Oxygen Saturation (Exercise) 98 % 95 %      Oxygen Saturation (Exit) 97 % 97 %      Rating of Perceived Exertion (Exercise) 13 15      Perceived Dyspnea (Exercise) 1 2      Duration Progress to 45 minutes of aerobic exercise without signs/symptoms of physical distress Progress to 45 minutes of aerobic exercise without signs/symptoms  of physical distress      Intensity THRR unchanged THRR unchanged        Progression   Progression Continue to progress workloads to maintain intensity without signs/symptoms of physical distress. Continue to progress workloads to maintain intensity without signs/symptoms of physical distress.        Resistance Training   Training Prescription Yes Yes      Weight 2 2      Reps  -- 10-15        Interval Training   Interval Training No No        Treadmill   MPH  -- 1.4      Grade  -- 0      Minutes  -- 15      METs  -- 2.07        NuStep   Level 4 3      Minutes 15 15      METs 2.6 2        Biostep-RELP   Level  -- 3      Minutes  -- 15      METs  -- 2         Exercise Comments:     Exercise Comments    Row Name 12/29/16 1115 01/05/17 0946 01/10/17 1157 01/18/17 1146 02/02/17 1226   Exercise Comments [name] [name] was unable to follow exercise prescription today, due to pt became dizzy on the treadmill.  She was advised to sit in chair, where we obtained BP and blood glucose.  BP 140/60 and BG 111.  Pt sat for 5 minutes and stated she felt better and continued on to next machine, T4.  Pt did well with no other complaints today.  Will continue to monitor for progression. [name] [name] has tolerated exrecise very well in her first week of exercise. Reviewed home exercise with pt today.  Pt plans to use [employer] at [facility] and walk at home for exercise.  Reviewed THR, pulse, RPE, sign and symptoms, NTG use, and when to call 911 or MD.  Also discussed weather considerations and indoor options.  Pt voiced understanding. [name] [name] has made significant improvement walking on TM.  Staff will continue to monitor progression. [name] [name] was absent last week but continues to tolerate exercise well.   [place] Name 02/15/17 1310 02/28/17 1227         Exercise Comments [name] [name] has been out due to low magnesium and dehydration.  She plans to return Monday 3/19. [name] [name] continues to tolerate exercise well.          Exercise Goals and Review:   Exercise Goals Re-Evaluation :  Exercise Goals Re-Evaluation    [place] Name 02/26/17 1254             Exercise Goal Re-Evaluation   Exercise Goals Review Increase Strenth and Stamina;Increase Physical Activity       Comments [name] [name] stated she was feeling better and was able to do more at home. She reported that she can now bend over to pick things up without getting dizzy and was suprised that she was able to cook breakfast and clean it all up without feeling tired.        Expected Outcomes [name] [name] will continue to attend class regularly and continue to gain strength and stamina to aid in ADL's.           Discharge Exercise Prescription (Final Exercise Prescription Changes):     Exercise Prescription Changes - 02/28/17 1200      Response to Exercise   Blood Pressure (Admit) 126/70   Blood Pressure (Exercise) 130/60   Blood Pressure (Exit) 134/66   Heart Rate (Admit) 76 bpm   Heart Rate (Exercise) 95 bpm   Heart Rate (Exit) 71 bpm   Oxygen Saturation (Admit) 96 %   Oxygen Saturation (Exercise) 95 %   Oxygen Saturation (Exit) 97 %   Rating of Perceived Exertion (Exercise) 15   Perceived Dyspnea (Exercise) 2   Duration Progress to 45 minutes of aerobic exercise without signs/symptoms of physical distress   Intensity THRR unchanged     Progression   Progression Continue to progress workloads to maintain intensity without signs/symptoms of physical distress.     Resistance Training   Training Prescription Yes   Weight 2   Reps 10-15     Interval Training   Interval Training No     Treadmill   MPH 1.4   Grade 0   Minutes 15   METs 2.07     NuStep   Level 3   Minutes 15   METs 2     Biostep-RELP   Level 3   Minutes 15   METs 2      Nutrition:  Target Goals: Understanding of nutrition guidelines, daily intake of sodium '1500mg'$ , cholesterol '200mg'$ , calories 30% from fat and 7% or less from saturated fats, daily to have 5 or  more servings of fruits and vegetables.  Biometrics:     Pre Biometrics - 12/19/16 1159      Pre Biometrics   Height '5\' 5"'$  (1.651 m)   Weight 242 lb 12.8 oz (110.1 kg)   Waist Circumference 47 inches   Hip Circumference 51.5 inches   Waist to Hip Ratio 0.91 %   BMI (Calculated) 40.5       Nutrition Therapy Plan and Nutrition Goals:     Nutrition Therapy & Goals - 02/28/17 1602      Nutrition Therapy   Diet diabetes, TLC   Drug/Food Interactions Coumadin/Vit K   Sodium 1500 grams     Personal Nutrition Goals   Nutrition Goal Continue with current eating pattern.    Comments Used plate method with food lists for basic meal planning; per oral diet history, Ms. Bolinger is making heart healthy and low sodium food choices. She is controlling carbohydrate intake; she is limiting foods high in vitamin K due to coumadin therapy.      Intervention Plan   Intervention Prescribe, educate and counsel regarding individualized specific dietary modifications aiming towards targeted core components such as weight, hypertension, lipid management, diabetes, heart failure and other comorbidities.;Nutrition handout(s) given to  patient.   Expected Outcomes Short Term Goal: Understand basic principles of dietary content, such as calories, fat, sodium, cholesterol and nutrients.;Short Term Goal: A plan has been developed with personal nutrition goals set during dietitian appointment.;Long Term Goal: Adherence to prescribed nutrition plan.      Nutrition Discharge: Rate Your Plate Scores:   Nutrition Goals Re-Evaluation:   Nutrition Goals Discharge (Final Nutrition Goals Re-Evaluation):   Psychosocial: Target Goals: Acknowledge presence or absence of significant depression and/or stress, maximize coping skills, provide positive support system. Participant is able to verbalize types and ability to use techniques and skills needed for reducing stress and depression.   Initial Review &  Psychosocial Screening:     Initial Psych Review & Screening - 12/19/16 1205      Family Dynamics   Good Support System? Yes   Comments Ms Losasso has good support from her family. Her 2 sisters participate in the Ashley classes at the [facility] together. She likes being active by visiting sick friends and shopping. She is looking forward to [employer].     Barriers   Psychosocial barriers to participate in program There are no identifiable barriers or psychosocial needs.;The patient should benefit from training in stress management and relaxation.     Screening Interventions   Interventions Encouraged to exercise;Program counselor consult      Quality of Life Scores:     Quality of Life - 12/19/16 1208      Quality of Life Scores   Health/Function Pre 21 %   Socioeconomic Pre 21 %   Psych/Spiritual Pre 20.86 %   Family Pre 20.4 %   GLOBAL Pre 20.71 %      PHQ-9: Recent Review Flowsheet Data    Depression screen [facility] 2/9 02/13/2017 12/19/2016 12/19/2016 09/18/2016 03/23/2016   Decreased Interest 0 2 0 0 0   Down, Depressed, Hopeless 0 0 0 0 0   PHQ - 2 Score 0 2 0 0 0   Altered sleeping - 3 - - -   Tired, decreased energy - 3 - - -   Change in appetite - 0 - - -   Feeling bad or failure about yourself  - 0 - - -   Trouble concentrating - 0 - - -   Moving slowly or fidgety/restless - 0 - - -   Suicidal thoughts - 0 - - -   PHQ-9 Score - 8 - - -   Difficult doing work/chores - Very difficult - - -     Interpretation of Total Score  Total Score Depression Severity:  1-4 = Minimal depression, 5-9 = Mild depression, 10-14 = Moderate depression, 15-19 = Moderately severe depression, 20-27 = Severe depression   Psychosocial Evaluation and Intervention:     Psychosocial Evaluation - 01/01/17 1115      Psychosocial Evaluation & Interventions   Interventions Encouraged to exercise with the program and follow exercise prescription;Relaxation education;Stress management education    Comments Counselor met with [name] [name] today for initial psychosocial evaluation.  She is a lovely 82 year old who has CHF and Diabetes and began struggling more with fatigue and shortness of breath.  Although she lives alone, she has a strong support system with a large family of sisters and (2) adult sons who live closeby.  She is also actively involved in her local church.  [name] [name] reports the main problem she has is her interrupted sleep after 3-4 hours each night.  She has a CPAP and is  prescribed Xanax to be taken as needed to help, but this continues to be problematic.  She has a good appetite and she reports she is typically in a positive mood.  [name] [name] denies a history of depression or current symptoms, but admits there has been some anxiety symptoms since she was diagnosed with CHF and is not resting well.  She states the medications help with these symptoms, but walking and moving is the best remedy she reports and is looking forward to doing that more consistently for her sleep as well as anxiety symptoms while in this program.  [name] [name] has goals to increase her strength, stamina and overall energy and get back to doing more normal household activities while in Pulmonary Rehab.  She would also like to sleep better and lose some weight.  Counselor encouraged her to see the dietician to discuss her nutrition and weight loss goals.  Counselor also encouraged [name] [name] to consistently exercise and move to improve her sleep quality and anxiety symptoms - which she reported had helped in the past.  Staff will follow with [name] [name] throughout the course of this program.     Continue Psychosocial Services  Yes  Follow with Ms. Pettijohn re: her sleep and anxiety symptoms -whether exercise has helped.      Psychosocial Re-Evaluation:     Psychosocial Re-Evaluation    Row Name 01/24/17 1123 02/07/17 1204 02/26/17 1232         Psychosocial Re-Evaluation   Comments Counselor follow up with [name] [name]  today reporting she is enjoying this program and always looks forward to coming.  She states ongoing problems with sleep interruption after maybe 3-4 hours per night of sleep.  She reports she is awakened to her "heart pounding and feeling it particularly in her right ear."  Her Drs have looked at this from many perspectives and are considering an ENT later in March.  However, her cardiologist found a swollen thyroid in the meantime and she is scheduled for a biopsy on March 1st for this.  She also has fungus under one of her fingernails and is getting that looked at, all the while changing primary care doctors in the meantime.  However, [name] [name] continues to be positive and upbeat and states exercise and her faith and strong support system keep her this way.  Counselor commended Ms. Warnell for her consistently positive attitude with so much going on; as well as her commitment to consistency in exercise.  Counselor encouraged [name] [name] to practice deep breathing when she wakes up with her rapid heart beat and pounding in her ears.  She agreed to try.   Counselor follow up with [facility] today.  She states the biopsy on her thyroid was benign and she had blood work done yesterday to determine what is causing her negative symptoms (heart pounding and feeling it in her ear).  She is in transition with primary care providers and will see her new PCP later this month.  After that she plans to get a referral for an ENT to get to the bottom of these symptoms.  She states she is sleeping better lately and is noticing more strength and flexibility.  She is not experiencing dizziness when she bends over any more since coming into this program.  She enjoys the socialization as well and keeps a sweet smile on her face most of the time.  She manages her stress by exercise; prayer and relying on her family and friends.  Couinselor commended [name] [name]  for all the hard work and progress made while being in this program.   Counselor follow up with [name] [name]  today reporting she is moving better and breathing better since coming into this program.  Her sleep has also improved and she is walking further without having to stop for breath.  Elah states she is bending over now without being dizzy so her flexibility has improved as well as her right shoulder problems since working out more consistently here.  Her mood is positive and she continues to manage her stress well.  Counselor commended [place] for her consistency in exercise and all the progress she has experienced as a result.       Expected Outcomes  -- [name] [name] will continue to exercise and experience improved sleep; mood; stamina and strength.  She will continue to pursue medical assistance with her health concerns.    --     Interventions Relaxation education  --  --        Psychosocial Discharge (Final Psychosocial Re-Evaluation):     Psychosocial Re-Evaluation - 02/26/17 1232      Psychosocial Re-Evaluation   Comments Counselor follow up with [name] [name] today reporting she is moving better and breathing better since coming into this program.  Her sleep has also improved and she is walking further without having to stop for breath.  Hilarie states she is bending over now without being dizzy so her flexibility has improved as well as her right shoulder problems since working out more consistently here.  Her mood is positive and she continues to manage her stress well.  Counselor commended [name] [name] for her consistency in exercise and all the progress she has experienced as a result.        Education: Education Goals: Education classes will be provided on a weekly basis, covering required topics. Participant will state understanding/return demonstration of topics presented.  Learning Barriers/Preferences:     Learning Barriers/Preferences - 12/19/16 1157      Learning Barriers/Preferences   Learning Barriers None   Learning Preferences None      Education Topics: Initial Evaluation Education: - Verbal,  written and demonstration of respiratory meds, RPE/PD scales, oximetry and breathing techniques. Instruction on use of nebulizers and MDIs: cleaning and proper use, rinsing mouth with steroid doses and importance of monitoring MDI activations.   Pulmonary Rehab from 03/09/2017 in [facility] Cardiac and Pulmonary Rehab  Date  12/19/16  Educator  LB  Instruction Review Code  2- meets goals/outcomes      General Nutrition Guidelines/Fats and Fiber: -Group instruction provided by verbal, written material, models and posters to present the general guidelines for heart healthy nutrition. Gives an explanation and review of dietary fats and fiber.   Pulmonary Rehab from 03/09/2017 in [facility] Cardiac and Pulmonary Rehab  Date  02/05/17  Educator  CR  Instruction Review Code  2- meets goals/outcomes      Controlling Sodium/Reading Food Labels: -Group verbal and written material supporting the discussion of sodium use in heart healthy nutrition. Review and explanation with models, verbal and written materials for utilization of the food label.   Pulmonary Rehab from 03/09/2017 in [facility] Cardiac and Pulmonary Rehab  Date  02/19/17  Educator  CR  Instruction Review Code  2- meets goals/outcomes      Exercise Physiology & Risk Factors: - Group verbal and written instruction with models to review the exercise physiology of the cardiovascular system and associated critical values. Details cardiovascular disease risk factors and the goals associated with  each risk factor.   Pulmonary Rehab from 03/09/2017 in [facility] Cardiac and Pulmonary Rehab  Date  03/09/17  Educator  A. Oletta Darter, EP  Instruction Review Code  2- meets goals/outcomes      Aerobic Exercise & Resistance Training: - Gives group verbal and written discussion on the health impact of inactivity. On the components of aerobic and resistive training programs and the benefits of this training and how to safely progress through these programs.   Pulmonary Rehab  from 03/09/2017 in [facility] Cardiac and Pulmonary Rehab  Date  01/03/17  Educator  [facility]  Instruction Review Code  2- meets goals/outcomes      Flexibility, Balance, General Exercise Guidelines: - Provides group verbal and written instruction on the benefits of flexibility and balance training programs. Provides general exercise guidelines with specific guidelines to those with heart or lung disease. Demonstration and skill practice provided.   Pulmonary Rehab from 03/09/2017 in [facility] Cardiac and Pulmonary Rehab  Date  01/26/17  Educator  Griselda Miner,   Instruction Review Code  2- meets goals/outcomes      Stress Management: - Provides group verbal and written instruction about the health risks of elevated stress, cause of high stress, and healthy ways to reduce stress.   Depression: - Provides group verbal and written instruction on the correlation between heart/lung disease and depressed mood, treatment options, and the stigmas associated with seeking treatment.   Pulmonary Rehab from 03/09/2017 in [facility] Cardiac and Pulmonary Rehab  Date  02/07/17  Educator  [facility]  Instruction Review Code  2- meets goals/outcomes      Exercise & Equipment Safety: - Individual verbal instruction and demonstration of equipment use and safety with use of the equipment.   Pulmonary Rehab from 03/09/2017 in [facility] Cardiac and Pulmonary Rehab  Date  12/27/16  Educator  [facility]  Instruction Review Code  2- meets goals/outcomes      Infection Prevention: - Provides verbal and written material to individual with discussion of infection control including proper hand washing and proper equipment cleaning during exercise session.   Pulmonary Rehab from 03/09/2017 in [facility] Cardiac and Pulmonary Rehab  Date  12/19/16  Educator  LB  Instruction Review Code  2- meets goals/outcomes      Falls Prevention: - Provides verbal and written material to individual with discussion of falls prevention and safety.   Pulmonary Rehab  from 03/09/2017 in [facility] Cardiac and Pulmonary Rehab  Date  12/19/16  Educator  LB  Instruction Review Code  2- meets goals/outcomes      Diabetes: - Individual verbal and written instruction to review signs/symptoms of diabetes, desired ranges of glucose level fasting, after meals and with exercise. Advice that pre and post exercise glucose checks will be done for 3 sessions at entry of program.   Pulmonary Rehab from 03/09/2017 in [facility] Cardiac and Pulmonary Rehab  Date  12/19/16  Educator  LB  Instruction Review Code  2- meets goals/outcomes      Chronic Lung Diseases: - Group verbal and written instruction to review new updates, new respiratory medications, new advancements in procedures and treatments. Provide informative websites and "800" numbers of self-education.   Pulmonary Rehab from 03/09/2017 in [facility] Cardiac and Pulmonary Rehab  Date  12/27/16  Educator  LB  Instruction Review Code  2- meets goals/outcomes      Lung Procedures: - Group verbal and written instruction to describe testing methods done to diagnose lung disease. Review the outcome of test results. Describe the  treatment choices: Pulmonary Function Tests, ABGs and oximetry.   Energy Conservation: - Provide group verbal and written instruction for methods to conserve energy, plan and organize activities. Instruct on pacing techniques, use of adaptive equipment and posture/positioning to relieve shortness of breath.   Triggers: - Group verbal and written instruction to review types of environmental controls: home humidity, furnaces, filters, dust mite/pet prevention, HEPA vacuums. To discuss weather changes, air quality and the benefits of nasal washing.   Pulmonary Rehab from 03/09/2017 in [facility] Cardiac and Pulmonary Rehab  Date  01/24/17  Educator  LB  Instruction Review Code  2- meets goals/outcomes      Exacerbations: - Group verbal and written instruction to provide: warning signs, infection symptoms,  calling MD promptly, preventive modes, and value of vaccinations. Review: effective airway clearance, coughing and/or vibration techniques. Create an Sports administrator.   Pulmonary Rehab from 03/09/2017 in [facility] Cardiac and Pulmonary Rehab  Date  01/17/17  Educator  LB  Instruction Review Code  2- meets goals/outcomes      Oxygen: - Individual and group verbal and written instruction on oxygen therapy. Includes supplement oxygen, available portable oxygen systems, continuous and intermittent flow rates, oxygen safety, concentrators, and Medicare reimbursement for oxygen.   Respiratory Medications: - Group verbal and written instruction to review medications for lung disease. Drug class, frequency, complications, importance of spacers, rinsing mouth after steroid MDI's, and proper cleaning methods for nebulizers.   AED/CPR: - Group verbal and written instruction with the use of models to demonstrate the basic use of the AED with the basic ABC's of resuscitation.   Breathing Retraining: - Provides individuals verbal and written instruction on purpose, frequency, and proper technique of diaphragmatic breathing and pursed-lipped breathing. Applies individual practice skills.   Pulmonary Rehab from 03/09/2017 in [facility] Cardiac and Pulmonary Rehab  Date  12/19/16  Educator  LB  Instruction Review Code  2- meets goals/outcomes      Anatomy and Physiology of the Lungs: - Group verbal and written instruction with the use of models to provide basic lung anatomy and physiology related to function, structure and complications of lung disease.   Pulmonary Rehab from 03/09/2017 in [facility] Cardiac and Pulmonary Rehab  Date  02/23/17  Educator  LB  Instruction Review Code  2- meets goals/outcomes      Heart Failure: - Group verbal and written instruction on the basics of heart failure: signs/symptoms, treatments, explanation of ejection fraction, enlarged heart and cardiomyopathy.   Pulmonary Rehab from  03/09/2017 in [facility] Cardiac and Pulmonary Rehab  Date  01/19/17  Educator  CE  Instruction Review Code  2- meets goals/outcomes      Sleep Apnea: - Individual verbal and written instruction to review Obstructive Sleep Apnea. Review of risk factors, methods for diagnosing and types of masks and machines for OSA.   Anxiety: - Provides group, verbal and written instruction on the correlation between heart/lung disease and anxiety, treatment options, and management of anxiety.   Relaxation: - Provides group, verbal and written instruction about the benefits of relaxation for patients with heart/lung disease. Also provides patients with examples of relaxation techniques.   Pulmonary Rehab from 03/09/2017 in [facility] Cardiac and Pulmonary Rehab  Date  01/10/17  Educator  [facility]  Instruction Review Code  2- Meets goals/outcomes      Knowledge Questionnaire Score:     Knowledge Questionnaire Score - 12/19/16 1157      Knowledge Questionnaire Score   Pre Score 7/10  Core Components/Risk Factors/Patient Goals at Admission:     Personal Goals and Risk Factors at Admission - 12/19/16 1201      Core Components/Risk Factors/Patient Goals on Admission    Weight Management Yes;Weight Loss   Intervention Weight Management: Develop a combined nutrition and exercise program designed to reach desired caloric intake, while maintaining appropriate intake of nutrient and fiber, sodium and fats, and appropriate energy expenditure required for the weight goal.;Weight Management: Provide education and appropriate resources to help participant work on and attain dietary goals.;Weight Management/Obesity: Establish reasonable short term and long term weight goals.;Obesity: Provide education and appropriate resources to help participant work on and attain dietary goals.   Admit Weight 242 lb 12.8 oz (110.1 kg)   Goal Weight: Short Term 237 lb (107.5 kg)   Goal Weight: Long Term 190 lb (86.2 kg)   Expected  Outcomes Short Term: Continue to assess and modify interventions until short term weight is achieved;Long Term: Adherence to nutrition and physical activity/exercise program aimed toward attainment of established weight goal;Weight Maintenance: Understanding of the daily nutrition guidelines, which includes 25-35% calories from fat, 7% or less cal from saturated fats, less than 237m cholesterol, less than 1.5gm of sodium, & 5 or more servings of fruits and vegetables daily;Weight Loss: Understanding of general recommendations for a balanced deficit meal plan, which promotes 1-2 lb weight loss per week and includes a negative energy balance of [phone] kcal/d;Understanding recommendations for meals to include 15-35% energy as protein, 25-35% energy from fat, 35-60% energy from carbohydrates, less than 2049mof dietary cholesterol, 20-35 gm of total fiber daily;Understanding of distribution of calorie intake throughout the day with the consumption of 4-5 meals/snacks   Sedentary Yes  Participates in [employer] [facility] [employer], support and counseling about physical activity/exercise needs.;Develop an individualized exercise prescription for aerobic and resistive training based on initial evaluation findings, risk stratification, comorbidities and participant's personal goals.   Expected Outcomes Achievement of increased cardiorespiratory fitness and enhanced flexibility, muscular endurance and strength shown through measurements of functional capacity and personal statement of participant.   Increase Strength and Stamina Yes   Intervention Provide advice, education, support and counseling about physical activity/exercise needs.;Develop an individualized exercise prescription for aerobic and resistive training based on initial evaluation findings, risk stratification, comorbidities and participant's personal goals.   Expected Outcomes Achievement of increased  cardiorespiratory fitness and enhanced flexibility, muscular endurance and strength shown through measurements of functional capacity and personal statement of participant.   Improve shortness of breath with ADL's Yes   Intervention Provide education, individualized exercise plan and daily activity instruction to help decrease symptoms of SOB with activities of daily living.   Expected Outcomes Short Term: Achieves a reduction of symptoms when performing activities of daily living.   Develop more efficient breathing techniques such as purse lipped breathing and diaphragmatic breathing; and practicing self-pacing with activity Yes   Intervention Provide education, demonstration and support about specific breathing techniuqes utilized for more efficient breathing. Include techniques such as pursed lipped breathing, diaphragmatic breathing and self-pacing activity.   Expected Outcomes Short Term: Participant will be able to demonstrate and use breathing techniques as needed throughout daily activities.   Increase knowledge of respiratory medications and ability to use respiratory devices properly  Yes  CPAP at [employer] and demonstration as needed of appropriate use of medications, inhalers, and oxygen therapy.   Expected Outcomes Short Term: Achieves understanding of medications use.  Understands that oxygen is a medication prescribed by physician. Demonstrates appropriate use of inhaler and oxygen therapy.   Diabetes Yes   Intervention Provide education about signs/symptoms and action to take for hypo/hyperglycemia.;Provide education about proper nutrition, including hydration, and aerobic/resistive exercise prescription along with prescribed medications to achieve blood glucose in normal ranges: Fasting glucose 65-99 mg/dL   Expected Outcomes Short Term: Participant verbalizes understanding of the signs/symptoms and immediate care of hyper/hypoglycemia, proper foot care and  importance of medication, aerobic/resistive exercise and nutrition plan for blood glucose control.;Long Term: Attainment of HbA1C < 7%.   Heart Failure Yes   Intervention Provide a combined exercise and nutrition program that is supplemented with education, support and counseling about heart failure. Directed toward relieving symptoms such as shortness of breath, decreased exercise tolerance, and extremity edema.   Expected Outcomes Improve functional capacity of life;Short term: Attendance in program 2-3 days a week with increased exercise capacity. Reported lower sodium intake. Reported increased fruit and vegetable intake. Reports medication compliance.;Short term: Daily weights obtained and reported for increase. Utilizing diuretic protocols set by physician.;Long term: Adoption of self-care skills and reduction of barriers for early signs and symptoms recognition and intervention leading to self-care maintenance.   Hypertension Yes   Intervention Provide education on lifestyle modifcations including regular physical activity/exercise, weight management, moderate sodium restriction and increased consumption of fresh fruit, vegetables, and low fat dairy, alcohol moderation, and smoking cessation.;Monitor prescription use compliance.   Expected Outcomes Short Term: Continued assessment and intervention until BP is < 140/24m HG in hypertensive participants. < 130/870mHG in hypertensive participants with diabetes, heart failure or chronic [name] disease.;Long Term: Maintenance of blood pressure at goal levels.   Lipids Yes   Intervention Provide education and support for participant on nutrition & aerobic/resistive exercise along with prescribed medications to achieve LDL <703mHDL >69m55m Expected Outcomes Short Term: Participant states understanding of desired cholesterol values and is compliant with medications prescribed. Participant is following exercise prescription and nutrition guidelines.;Long  Term: Cholesterol controlled with medications as prescribed, with individualized exercise RX and with personalized nutrition plan. Value goals: LDL < 70mg51mL > 40 mg.      Core Components/Risk Factors/Patient Goals Review:      Goals and Risk Factor Review    Row Name 01/01/17 1058 01/22/17 1141 02/26/17 1256         Core Components/Risk Factors/Patient Goals Review   Personal Goals Review Weight Management/Obesity;Increase Strength and Stamina;Hypertension;Lipids;Diabetes;Improve shortness of breath with ADL's Sedentary;Increase Strength and Stamina;Other Improve shortness of breath with ADL's;Weight Management/Obesity;Stress;Diabetes;Lipids;Hypertension     Review The following goals were discussed with the patient. Baylee Maddixed that her last A1C was 6.8 and all the rest of her blood work has been in acceptable ranges. She did state that her SOB sometimes limits her activities or slows her down. She is hoping that his program will help her with that. Before starting [facility] she was going to a SilveChief of Staffs 3 days a week at the [employer] stated that she does eventually want to work towards using the machines at the [employer]. Weight loss is a major gaol and exercise guidelines for weight loss were disucssed with the patient.  Antoinett doesnt want to reschedule her RD appointment.  She "loves exercise"  and reports she is doing much better with exercise.  She can get throught her ADLs without as much fatigue and uses a fan to cool off - her Dr feels her  getting hot is from a fib.  She reports not sleeping well due to using a CPAP machine. Zaylei stated that she is on a temporary medication that is causing her to gain weight but is almost done this med. She stated that she does not have any SOB anymore with daily activities and she is doing well emotionally. She reported that her recent blood work showed an A1C of 6.7 and lipids and blood pressure are under control.      Expected Outcomes Mirabelle  will consistantly come to class and participate in her exercise program as well as education classes. Using what she learns and being consistant with her attendance will help her see progress in the areas mentioned above.   -- Genessis will continue to attend exercise and education classes regularly to aid in accomplishing the above mentioned goals.         Core Components/Risk Factors/Patient Goals at Discharge (Final Review):      Goals and Risk Factor Review - 02/26/17 1256      Core Components/Risk Factors/Patient Goals Review   Personal Goals Review Improve shortness of breath with ADL's;Weight Management/Obesity;Stress;Diabetes;Lipids;Hypertension   Review Aysa stated that she is on a temporary medication that is causing her to gain weight but is almost done this med. She stated that she does not have any SOB anymore with daily activities and she is doing well emotionally. She reported that her recent blood work showed an A1C of 6.7 and lipids and blood pressure are under control.    Expected Outcomes Stefanee will continue to attend exercise and education classes regularly to aid in accomplishing the above mentioned goals.       ITP Comments:     ITP Comments    Row Name 02/15/17 1209           ITP Comments Marleigh sees her [name] Dr tomorrow and will bring in clearance to exercise when she returns to class Monday.          Comments: 30 Day Note Review

## 2017-03-12 NOTE — Progress Notes (Signed)
Daily Session Note  Patient Details  Name: [name] [name] MRN: [id number] Date of Birth: [birth year] Referring Provider:     Pulmonary Rehab from 12/19/2016 in [facility] Cardiac and Pulmonary Rehab  Referring Provider  Gollan      Encounter Date: 03/12/2017  Check In:     Session Check In - 03/12/17 1148      Check-In   Location [facility]-Cardiac & Pulmonary Rehab   Staff Present Carson Myrtle, BS, RRT, Respiratory Therapist;Kelly Amedeo Plenty, BS, ACSM CEP, Exercise Physiologist;Brisha Mccabe Oletta Darter, BA, ACSM CEP, Exercise Physiologist   Supervising physician immediately available to respond to emergencies [facility] immediately available ER MD   Physician(s) Corky Downs and Jimmye Norman   Medication changes reported     No   Fall or balance concerns reported    No   Warm-up and Cool-down Performed as group-led instruction   Resistance Training Performed Yes   VAD Patient? No     Pain Assessment   Currently in Pain? No/denies         History  Smoking Status  . Former Smoker  . Packs/day: 0.50  . Years: 34.00  . Types: Cigarettes  . Quit date: 08/15/1989  Smokeless Tobacco  . Never Used    Goals Met:  Proper associated with RPD/PD & O2 Sat Independence with exercise equipment Exercise tolerated well Strength training completed today  Goals Unmet:  Not Applicable  Comments: Pt able to follow exercise prescription today without complaint.  Will continue to monitor for progression.    Dr. Emily Filbert is Medical Director for [facility] and [facility] Rehabilitation.

## 2017-03-14 DIAGNOSIS — I5032 Chronic diastolic (congestive) heart failure: Secondary | ICD-10-CM | POA: Diagnosis not present

## 2017-03-14 NOTE — Progress Notes (Signed)
Daily Session Note  Patient Details  Name: [name] [name] MRN: [id number] Date of Birth: [birth year] Referring Provider:     Pulmonary Rehab from 12/19/2016 in [facility] Cardiac and Pulmonary Rehab  Referring Provider  Gollan      Encounter Date: 03/14/2017  Check In:     Session Check In - 03/14/17 1253      Check-In   Location [facility]-Cardiac & Pulmonary Rehab   Staff Present Carson Myrtle, BS, RRT, Respiratory Lennie Hummer, MA, ACSM RCEP, Exercise Physiologist;Damieon Armendariz Oletta Darter, BA, ACSM CEP, Exercise Physiologist   Supervising physician immediately available to respond to emergencies [facility] immediately available ER MD   Physician(s) Jimmye Norman and Darl Householder   Medication changes reported     No   Fall or balance concerns reported    No   Warm-up and Cool-down Performed as group-led instruction   Resistance Training Performed Yes   VAD Patient? No     Pain Assessment   Currently in Pain? No/denies           Exercise Prescription Changes - 03/14/17 1200      Response to Exercise   Blood Pressure (Admit) 124/70   Blood Pressure (Exercise) 130/84   Blood Pressure (Exit) 118/56   Heart Rate (Admit) 69 bpm   Heart Rate (Exercise) 88 bpm   Heart Rate (Exit) 62 bpm   Oxygen Saturation (Admit) 96 %   Oxygen Saturation (Exercise) 96 %   Oxygen Saturation (Exit) 98 %   Rating of Perceived Exertion (Exercise) 13   Perceived Dyspnea (Exercise) 2   Duration Progress to 45 minutes of aerobic exercise without signs/symptoms of physical distress   Intensity THRR unchanged     Progression   Progression Continue to progress workloads to maintain intensity without signs/symptoms of physical distress.     Resistance Training   Training Prescription Yes   Weight 2   Reps 10-15     Treadmill   MPH 1.8   Grade 0   Minutes 15   METs 2.38     Biostep-RELP   Level 3   Minutes 15   METs 2      History  Smoking Status  . Former Smoker  . Packs/day: 0.50  . Years: 34.00  .  Types: Cigarettes  . Quit date: 08/15/1989  Smokeless Tobacco  . Never Used    Goals Met:  Proper associated with RPD/PD & O2 Sat Independence with exercise equipment Exercise tolerated well Strength training completed today  Goals Unmet:  Not Applicable  Comments: Pt able to follow exercise prescription today without complaint.  Will continue to monitor for progression.    Dr. Emily Filbert is Medical Director for [place] and [facility].

## 2017-03-15 ENCOUNTER — Other Ambulatory Visit: Payer: Self-pay | Admitting: Family Medicine

## 2017-03-16 DIAGNOSIS — I5032 Chronic diastolic (congestive) heart failure: Secondary | ICD-10-CM | POA: Diagnosis not present

## 2017-03-16 NOTE — Progress Notes (Signed)
Daily Session Note  Patient Details  Name: [name] [name] MRN: [id number] Date of Birth: 01-19-1935 Referring Provider:     Pulmonary Rehab from 12/19/2016 in [facility] Cardiac and Pulmonary Rehab  Referring Provider  Gollan      Encounter Date: 03/16/2017  Check In:     Session Check In - 03/16/17 1053      Check-In   Location [facility]-Cardiac & Pulmonary Rehab   Staff Present Gerlene Burdock, RN, Vickki Hearing, BA, ACSM CEP, Exercise Physiologist;Other  Darel Hong RN   Supervising physician immediately available to respond to emergencies [facility] immediately available ER MD   Physician(s) Burlene Arnt and Schaevitz   Medication changes reported     No   Fall or balance concerns reported    No   Warm-up and Cool-down Performed as group-led instruction   Resistance Training Performed Yes   VAD Patient? No     Pain Assessment   Currently in Pain? No/denies         History  Smoking Status  . Former Smoker  . Packs/day: 0.50  . Years: 34.00  . Types: Cigarettes  . Quit date: 08/15/1989  Smokeless Tobacco  . Never Used    Goals Met:  Proper associated with RPD/PD & O2 Sat Independence with exercise equipment Exercise tolerated well Strength training completed today  Goals Unmet:  Not Applicable  Comments: Pt able to follow exercise prescription today without complaint.  Will continue to monitor for progression.    Dr. Emily Filbert is Medical Director for [place] and [facility].

## 2017-03-19 ENCOUNTER — Encounter: Payer: Medicare Other | Admitting: *Deleted

## 2017-03-19 DIAGNOSIS — I5032 Chronic diastolic (congestive) heart failure: Secondary | ICD-10-CM | POA: Diagnosis not present

## 2017-03-19 NOTE — Progress Notes (Signed)
Daily Session Note  Patient Details  Name: [name] [name] MRN: [id number] Date of Birth: July 05, 1935 Referring Provider:     Pulmonary Rehab from 12/19/2016 in [facility] Cardiac and Pulmonary Rehab  Referring Provider  Gollan      Encounter Date: 03/19/2017  Check In:     Session Check In - 03/19/17 1012      Check-In   Location [facility]-Cardiac & Pulmonary Rehab   Staff Present Nada Maclachlan, BA, ACSM CEP, Exercise Physiologist;Laureen Owens Shark, BS, RRT, Respiratory Bertis Ruddy, BS, ACSM CEP, Exercise Physiologist   Supervising physician immediately available to respond to emergencies [facility] immediately available ER MD   Physician(s) Drs. Rifenbark and Kinner   Medication changes reported     No   Fall or balance concerns reported    No   Warm-up and Cool-down Performed as group-led Location manager Performed Yes   VAD Patient? No     Pain Assessment   Currently in Pain? No/denies   Multiple Pain Sites No         History  Smoking Status  . Former Smoker  . Packs/day: 0.50  . Years: 34.00  . Types: Cigarettes  . Quit date: 08/15/1989  Smokeless Tobacco  . Never Used    Goals Met:  Proper associated with RPD/PD & O2 Sat Independence with exercise equipment Exercise tolerated well Personal goals reviewed Strength training completed today  Goals Unmet:  Not Applicable  Comments: Pt able to follow exercise prescription today without complaint.  Will continue to monitor for progression.    Dr. Emily Filbert is Medical Director for [place] and [facility].

## 2017-03-21 DIAGNOSIS — I5032 Chronic diastolic (congestive) heart failure: Secondary | ICD-10-CM

## 2017-03-21 NOTE — Progress Notes (Signed)
Daily Session Note  Patient Details  Name: YADHIRA MCKNEELY MRN: [id number] Date of Birth: 18-Nov-1935 Referring Provider:     Pulmonary Rehab from 12/19/2016 in [facility] Cardiac and Pulmonary Rehab  Referring Provider  Gollan      Encounter Date: 03/21/2017  Check In:     Session Check In - 03/21/17 1205      Check-In   Location [facility]Cardiac & Pulmonary Rehab   Staff Present Carson Myrtle, BS, RRT, Respiratory Lennie Hummer, MA, ACSM RCEP, Exercise Physiologist;Amanda Oletta Darter, BA, ACSM CEP, Exercise Physiologist   Supervising physician immediately available to respond to emergencies [facility] immediately available ER MD   Physician(s) Devra Dopp and Quentin Cornwall   Medication changes reported     No   Fall or balance concerns reported    No   Warm-up and Cool-down Performed as group-led instruction         History  Smoking Status  . Former Smoker  . Packs/day: 0.50  . Years: 34.00  . Types: Cigarettes  . Quit date: 08/15/1989  Smokeless Tobacco  . Never Used    Goals Met:  Proper associated with RPD/PD & O2 Sat Exercise tolerated well No report of cardiac concerns or symptoms Strength training completed today  Goals Unmet:  Not Applicable  Comments: Pt able to follow exercise prescription today without complaint.  Will continue to monitor for progression.    Dr. Emily Filbert is Medical Director for [place] and [facility].

## 2017-03-26 ENCOUNTER — Encounter: Payer: Medicare Other | Admitting: *Deleted

## 2017-03-26 DIAGNOSIS — I5032 Chronic diastolic (congestive) heart failure: Secondary | ICD-10-CM | POA: Diagnosis not present

## 2017-03-26 NOTE — Progress Notes (Signed)
Daily Session Note  Patient Details  Name: LUISANA LUTZKE MRN: [id number] Date of Birth: 07-17-1935 Referring Provider:     Pulmonary Rehab from 12/19/2016 in [facility] Cardiac and Pulmonary Rehab  Referring Provider  Gollan      Encounter Date: 03/26/2017  Check In:     Session Check In - 03/26/17 1013      Check-In   Location [facility] & Pulmonary Rehab   Staff Present Carson Myrtle, BS, RRT, Respiratory Dareen Piano, BA, ACSM CEP, Exercise Physiologist;Mirriam Vadala Amedeo Plenty, BS, ACSM CEP, Exercise Physiologist   Supervising physician immediately available to respond to emergencies [facility] immediately available ER MD   Physician(s) Mariea Clonts and Alfred Levins   Medication changes reported     No   Fall or balance concerns reported    No   Warm-up and Cool-down Performed as group-led instruction   Resistance Training Performed Yes   VAD Patient? No     Pain Assessment   Currently in Pain? No/denies   Multiple Pain Sites No         History  Smoking Status  . Former Smoker  . Packs/day: 0.50  . Years: 34.00  . Types: Cigarettes  . Quit date: 08/15/1989  Smokeless Tobacco  . Never Used    Goals Met:  Proper associated with RPD/PD & O2 Sat Independence with exercise equipment Exercise tolerated well Strength training completed today  Goals Unmet:  Not Applicable  Comments: Pt able to follow exercise prescription today without complaint.  Will continue to monitor for progression.    Dr. Emily Filbert is Medical Director for [place] and [facility].

## 2017-03-28 VITALS — Ht 65.0 in | Wt 246.9 lb

## 2017-03-28 DIAGNOSIS — I5032 Chronic diastolic (congestive) heart failure: Secondary | ICD-10-CM | POA: Diagnosis not present

## 2017-03-28 NOTE — Progress Notes (Signed)
Daily Session Note  Patient Details  Name: [name] [name] MRN: [id number] Date of Birth: 1935-07-16 Referring Provider:     Pulmonary Rehab from 12/19/2016 in [facility] Cardiac and Pulmonary Rehab  Referring Provider  Gollan      Encounter Date: 03/28/2017  Check In:     Session Check In - 03/28/17 1203      Check-In   Location [facility] & Pul[facility] Re[facility]   Staff Present Carson Myrtle, BS, RRT, Respiratory Lennie Hummer, MA, ACSM RCEP, Exercise Physiologist;Amanda Oletta Darter, BA, ACSM CEP, Exercise Physiologist   Supervising physician immediately available to respond to emergencies [facility] immediately available ER MD   Physician(s) Joni Fears and Jimmye Norman   Medication changes reported     No   Fall or balance concerns reported    No   Warm-up and Cool-down Performed as group-led instruction   Resistance Training Performed Yes   VAD Patient? No     Pain Assessment   Currently in Pain? No/denies           Exercise Prescription Changes - 03/28/17 1200      Response to Exercise   Blood Pressure (Admit) 132/75   Blood Pressure (Exercise) 142/66   Blood Pressure (Exit) 124/70   Heart Rate (Admit) 90 bpm   Heart Rate (Exercise) 128 bpm   Heart Rate (Exit) 61 bpm   Oxygen Saturation (Admit) 97 %   Oxygen Saturation (Exercise) 91 %   Oxygen Saturation (Exit) 97 %   Rating of Perceived Exertion (Exercise) 13   Perceived Dyspnea (Exercise) 1   Duration Progress to 45 minutes of aerobic exercise without signs/symptoms of physical distress   Intensity THRR unchanged     Progression   Progression Continue to progress workloads to maintain intensity without signs/symptoms of physical distress.     Resistance Training   Training Prescription Yes   Weight 2   Reps 10-15     Interval Training   Interval Training No     Biostep-RELP   Level 4   Minutes 15   METs 2      History  Smoking Status  . Former Smoker  . Packs/day: 0.50  . Years: 34.00  . Types:  Cigarettes  . Quit date: 08/15/1989  Smokeless Tobacco  . Never Used    Goals Met:  Proper associated with RPD/PD & O2 Sat Independence with exercise equipment Exercise tolerated well Strength training completed today  Goals Unmet:  Not Applicable  Comments:      [place] Name 12/19/16 1201 02/26/17 1250 03/28/17 1205     6 Minute Walk   Phase  - Mid Program Discharge   Distance 965 feet 985 feet 1210 feet   Distance % Change  -  - 25 %   Walk Time 6 minutes 6 minutes 6 minutes   # of Rest Breaks 0 0 0   MPH 1.82 1.87 2.29   METS 2.38 2.45 2.8   RPE '15 13 13   ' Perceived Dyspnea  '2 2 1   ' VO2 Peak 5.43 8.5 9.6   Symptoms No Yes (comment)  back tightness No   Resting HR 68 bpm 83 bpm 90 bpm   Resting BP 122/66 120/76 132/74   Max Ex. HR 113 bpm 107 bpm 128 bpm   Max Ex. BP 172/72 152/80 142/66     Interval HR   Baseline HR 68 83 90   1 Minute HR 110 105 100   2 Minute  HR 104 107 104   3 Minute HR 102 104  -   4 Minute HR  - 103 128   5 Minute HR 98 102 106   6 Minute HR 113 102 99   2 Minute Post HR  - 91  -   Interval Heart Rate? Yes Yes  -     Interval Oxygen   Interval Oxygen? Yes Yes  -   Baseline Oxygen Saturation % 97 % 97 % 97 %   Baseline Liters of Oxygen  - 0 L  -   1 Minute Oxygen Saturation % 96 % 96 % 96 %   1 Minute Liters of Oxygen  - 0 L  -   2 Minute Oxygen Saturation % 94 % 97 % 94 %   2 Minute Liters of Oxygen  - 0 L  -   3 Minute Oxygen Saturation % 95 % 97 %  -   3 Minute Liters of Oxygen  - 0 L  -   4 Minute Oxygen Saturation %  - 95 % 95 %   4 Minute Liters of Oxygen  - 0 L  -   5 Minute Oxygen Saturation % 96 % 95 % 95 %   5 Minute Liters of Oxygen  - 0 L  -   6 Minute Oxygen Saturation % 95 % 95 % 96 %   6 Minute Liters of Oxygen  - 0 L  -   2 Minute Post Oxygen Saturation %  - 98 %  -   2 Minute Post Liters of Oxygen  - 0 L  -        Dr. Emily Filbert is Medical Director for [place] and  [facility].

## 2017-03-30 DIAGNOSIS — I5032 Chronic diastolic (congestive) heart failure: Secondary | ICD-10-CM | POA: Diagnosis not present

## 2017-03-30 NOTE — Progress Notes (Signed)
Daily Session Note  Patient Details  Name: [name] [name] MRN: [id number] Date of Birth: October 07, 1935 Referring Provider:     Pulmonary Rehab from 12/19/2016 in [facility] Cardiac and Pulmonary Rehab  Referring Provider  Gollan      Encounter Date: 03/30/2017  Check In:     Session Check In - 03/30/17 1133      Check-In   Location [facility]-Cardiac & Pulmonary Rehab   Staff Present Alberteen Sam, MA, ACSM RCEP, Exercise Physiologist;Nelton Amsden Oletta Darter, BA, ACSM CEP, Exercise Physiologist;Zakeria Kellie Shropshire, RN, BSN, MA   Supervising physician immediately available to respond to emergencies [facility] immediately available ER MD   Physician(s) Tilda Franco and McShane   Medication changes reported     No   Fall or balance concerns reported    No   Warm-up and Cool-down Performed as group-led instruction   Resistance Training Performed Yes   VAD Patient? No     Pain Assessment   Currently in Pain? No/denies         History  Smoking Status  . Former Smoker  . Packs/day: 0.50  . Years: 34.00  . Types: Cigarettes  . Quit date: 08/15/1989  Smokeless Tobacco  . Never Used    Goals Met:  Proper associated with RPD/PD & O2 Sat Independence with exercise equipment Exercise tolerated well Strength training completed today  Goals Unmet:  Not Applicable  Comments: Pt able to follow exercise prescription today without complaint.  Will continue to monitor for progression.    Dr. Emily Filbert is Medical Director for [place] and [facility].

## 2017-03-31 ENCOUNTER — Telehealth: Payer: Self-pay | Admitting: Physician Assistant

## 2017-03-31 NOTE — Telephone Encounter (Signed)
[name] [name] with PMH of afib on coumadin and SSS s/p PPM called after hour answering service complaining of persistent tachycardia since last Wednesday. I'll then and palpitation, she denies any chest pain, dizziness, presyncope, or shortness of breath. Her vital signs stable. She has been taking increased dose of carvedilol at night which is controlling her heart rate overnight quite well. Her heart rate still occasionally goes up to 100 to 110s.   Since her heart rate is only mildly fast, I think it is reasonable for her to seek outpatient evaluation is that of inpatient evaluation. I recommended for her to call our office first thing Monday morning try to arrange a follow-up either with Dr. Rockey Situ and APP. If there is no appointment available in [place] area, she can even come to [place] office to see an APP here.  Options in this case is increase carvedilol permanently, or her EF is normal on echo in 2014, potentially switched to metoprolol tartrate for better rate control. If she can demonstrate her INR is painful in the therapeutic range, we can also consider increasing her flecainide to 100 mg twice a day. If medical therapy fails, then can consider ablation, however A. fib ablation has higher chance of recurrence  Again we'll send staff message to our office to arrange early follow-up within one week. She should have an EKG on follow-up.  [name] Corrigan PA Pager: [phone]

## 2017-04-01 ENCOUNTER — Encounter: Payer: Self-pay | Admitting: Internal Medicine

## 2017-04-02 ENCOUNTER — Ambulatory Visit (INDEPENDENT_AMBULATORY_CARE_PROVIDER_SITE_OTHER): Payer: Medicare Other | Admitting: Cardiovascular Disease

## 2017-04-02 ENCOUNTER — Encounter: Payer: Self-pay | Admitting: Cardiovascular Disease

## 2017-04-02 VITALS — BP 110/58 | HR 70 | Ht 64.0 in | Wt 251.8 lb

## 2017-04-02 DIAGNOSIS — I440 Atrioventricular block, first degree: Secondary | ICD-10-CM

## 2017-04-02 DIAGNOSIS — I5022 Chronic systolic (congestive) heart failure: Secondary | ICD-10-CM

## 2017-04-02 DIAGNOSIS — I5032 Chronic diastolic (congestive) heart failure: Secondary | ICD-10-CM | POA: Diagnosis not present

## 2017-04-02 DIAGNOSIS — I480 Paroxysmal atrial fibrillation: Secondary | ICD-10-CM

## 2017-04-02 DIAGNOSIS — I499 Cardiac arrhythmia, unspecified: Secondary | ICD-10-CM

## 2017-04-02 DIAGNOSIS — Z950 Presence of cardiac pacemaker: Secondary | ICD-10-CM | POA: Diagnosis not present

## 2017-04-02 MED ORDER — FLECAINIDE ACETATE 100 MG PO TABS: 100.0000 mg | ORAL_TABLET | Freq: Two times a day (BID) | ORAL | 6 refills | Status: DC

## 2017-04-02 NOTE — Patient Instructions (Addendum)
Medication Instructions:   Please increase the flecainide up to 100 mg twice a day Stay on coreg Please monitor heart rate and call if it does not go back to normal rhythm  Take extra lasix for weight gain   Labwork:  No new labs needed  Testing/Procedures:  No further testing at this time   I recommend watching educational videos on topics of interest to you at:       [email or web address]  Enter code: [email or web address]    Follow-Up: It was a pleasure seeing you in the office today. Please call us if you have new issues that need to be addressed before your next appt.  [phone]  Your physician wants you to follow-up in: 1 month.    If you need a refill on your cardiac medications before your next appointment, please call your pharmacy.

## 2017-04-02 NOTE — Progress Notes (Signed)
* [facility]     Assessment and Plan:  82 yo female with OSA and atrial fibrillation which has been causing sleep disturbance. AFib is now better controlled and sleep better. She is tolerating cpap well.   OSA --Continues to have episodes of waking at night with difficulty breathing, review of her download data showed that her OSA was adequately treated with minimal leaks --auto-set pressure to 10-20.  RLS -Appears adequately controlled at this time. Continue iron supplement.   Insomnia. -Sleep maintenance type -Continue on Xanax 0.25-0.5 mg daily at bedtime.   Date: 04/02/2017  MRN# [id number] [name] [name] Apr 14, 1935   [name] [name] is a 82 y.o. old female seen in follow up for chief complaint of  Chief Complaint  Patient presents with  . Sleep Apnea    Patient has been in A-fib x 1 week. She is doing better with cpap 6 hrs. She still awakens several times nightly but is rested in the morning.     HPI:   Patient is a 82 yo female with history of OSA and RLS,  Afib, SSS, HTN, DM, GERD.   Since her last visit, the patient has been diagnosed with atrial fibrillation. She is no longer waking up early since her Afib has been treated.  She takes xanax 0.25 to 0.5 every night which helps her fall asleep and stay asleep.   Review of download data for the month of April 2018;  Average usage is 6 hours and 23 minutes, AutoSet 10-20. 95th percentile pressure was 12, review of graphic data shows pressures typically between 10 and 13. Leaks are minimal, residual AHI was 1.1.  PSG 10/24/13 >> AHI 7 Auto CPAP 05/10/15 to 06/08/15 >> used on 29 of 30 nights with average 6 hrs and 41 min.  Average AHI is 1.7 with median CPAP 8 cm H2O and 95 th percentile CPAP 12 cm H20.   PMhx >> Paroxymal atrial fibrillation, SSS s/p PM, HTN, Diastolic CHF, HLD, DM, HH, GERD, Glaucoma  Medication:   Outpatient Encounter Prescriptions as of 04/03/2017  Medication Sig  .  acetaminophen (TYLENOL) 500 MG tablet Take 500 mg by mouth every 6 (six) hours as needed.  . ALPRAZolam (XANAX) 0.5 MG tablet Take 1 tablet (0.5 mg total) by mouth daily as needed.  Marland Kitchen amLODipine (NORVASC) 10 MG tablet TAKE 1 TABLET (10 MG TOTAL) BY MOUTH DAILY.  Marland Kitchen atorvastatin (LIPITOR) 20 MG tablet TAKE 1 TABLET (20 MG TOTAL) BY MOUTH DAILY.  . Brinzolamide-Brimonidine (SIMBRINZA) 1-0.2 % SUSP Place 1 drop into both eyes 2 (two) times daily.  . carvedilol (COREG) 12.5 MG tablet Take 1 tablet in the morning with breakfast and 2 tablets pm daily by mouth in the evening before bed.  . doxycycline (VIBRA-TABS) 100 MG tablet Take 1 tablet (100 mg total) by mouth 2 (two) times daily.  Marland Kitchen esomeprazole (NEXIUM) 40 MG capsule TAKE 1 CAPSULE (40 MG TOTAL) BY MOUTH DAILY.  . ferrous sulfate 325 (65 FE) MG EC tablet Take 325 mg by mouth daily.  . flecainide (TAMBOCOR) 100 MG tablet Take 1 tablet (100 mg total) by mouth 2 (two) times daily.  . furosemide (LASIX) 20 MG tablet Take 1 tablet (20 mg total) by mouth daily.  Marland Kitchen gentamicin (GARAMYCIN) 0.3 % ophthalmic solution   . glipiZIDE (GLUCOTROL) 10 MG tablet TAKE 1 TABLET (10 MG TOTAL) BY MOUTH 2 (TWO) TIMES DAILY BEFORE A MEAL.  . metFORMIN (GLUCOPHAGE-XR) 500 MG 24 hr tablet TAKE  1 TABLET (500 MG TOTAL) BY MOUTH 2 (TWO) TIMES DAILY.  . mometasone (ELOCON) 0.1 % cream Apply topically daily. Apply to affected area as directed two times a day  . Multiple Vitamins-Minerals (CENTRUM SILVER PO) Take by mouth daily.  Marland Kitchen olmesartan (BENICAR) 40 MG tablet TAKE 1 TABLET (40 MG TOTAL) BY MOUTH DAILY.  Marland Kitchen terbinafine (LAMISIL) 250 MG tablet   . TRADJENTA 5 MG TABS tablet TAKE 1 TABLET (5 MG TOTAL) BY MOUTH DAILY.  Marland Kitchen travoprost, benzalkonium, (TRAVATAN) 0.004 % ophthalmic solution Place 1 drop into both eyes at bedtime.   Marland Kitchen warfarin (COUMADIN) 3 MG tablet TAKE 1-1.5 TABLETS DAILY AT 6 PM. TAKE 1 TAB ON TUES, THURS, SAT, SUN. TAKE 4.5 MG ON MON, WED, FRI   No  facility-administered encounter medications on file as of 04/03/2017.      Allergies:  Metoprolol; Diltiazem; Levofloxacin in d5w; and Levofloxacin  Review of Systems: Gen:  Denies  fever, sweats. HEENT: Denies blurred vision. Cvc:  No dizziness, chest pain or heaviness Resp:   Denies cough or sputum porduction. Gi: Denies swallowing difficulty, stomach pain.  Gu:  Denies bladder incontinence, burning urine Ext:   No Joint pain, stiffness. Skin: No skin rash, easy bruising. Endoc:  No polyuria, polydipsia. Psych: No depression, insomnia. Other:  All other systems were reviewed and found to be negative other than what is mentioned in the HPI.   Physical Examination:   VS: BP (!) 124/58 (BP Location: Left Arm, Patient Position: Sitting, Cuff Size: Normal)   Pulse 76   Resp 16   Ht 5\' 5"  (1.651 m)   Wt 250 lb (113.4 kg)   SpO2 100%   BMI 41.60 kg/m   General Appearance: No distress  Neuro:without focal findings,  speech normal,  HEENT: PERRLA, EOM intact. Pulmonary: normal breath sounds, No wheezing.   CardiovascularNormal S1,S2.  No m/r/g.   Abdomen: Benign, Soft, non-tender. Renal:  No costovertebral tenderness  GU:  Not performed at this time. Endoc: No evident thyromegaly, no signs of acromegaly. Skin:   warm, no rash. Extremities: normal, no cyanosis, clubbing.   LABORATORY PANEL:   CBC No results for input(s): WBC, HGB, HCT, PLT in the last 168 hours. ------------------------------------------------------------------------------------------------------------------  Chemistries  No results for input(s): NA, K, CL, CO2, GLUCOSE, BUN, CREATININE, CALCIUM, MG, AST, ALT, ALKPHOS, BILITOT in the last 168 hours.  Invalid input(s): GFRCGP ------------------------------------------------------------------------------------------------------------------  Cardiac Enzymes No results for input(s): TROPONINI in the last 168  hours. ------------------------------------------------------------  RADIOLOGY:   No results found for this or any previous visit. Results for orders placed during the hospital encounter of 10/26/15  DG Chest 2 View   Narrative CLINICAL DATA:  Cough.  EXAM: CHEST  2 VIEW  COMPARISON:  08/03/2014 .  FINDINGS: Cardiac pacer with lead tip in the right atrium and right ventricle. Cardiomegaly with pulmonary vascular prominence and mild interstitial prominence noted. Pleural effusion or pneumothorax. Degenerative changes thoracic spine with stable mild mid thoracic vertebral body compression fracture.  IMPRESSION: 1. Cardiac pacer stable position. Cardiomegaly with mild pulmonary interstitial prominence. Mild component congestive heart failure cannot be excluded. Mild pneumonitis cannot be excluded. 2. Diffuse degenerative changes and osteopenia thoracic spine. Stable mild mid thoracic vertebral body compression fracture.   Electronically Signed   By: Marcello Moores  Register   On: 10/26/2015 14:54    ------------------------------------------------------------------------------------------------------------------  Thank  you for allowing [facility] to assist in the care of your patient. Our recommendations are noted above.  Please contact us if we can be of further service.   Marda Stalker, MD.  [facility] Office Number: [phone]  Patricia Pesa, M.D.  Vilinda Boehringer, M.D.  Merton Border, M.D  04/02/2017

## 2017-04-02 NOTE — Progress Notes (Signed)
Cardiology Office Note  Date:  04/02/2017   ID:  [name] [name], DOB 02-Feb-1935, MRN [id number]  PCP:  Dion Body, MD   Chief Complaint  Patient presents with  . other    per pt call-c/o tachycardia/palpitations. Pt c/o fatigue, sob; denies cp. Pt states she will take last dose of prednisone tomorrow. Reviewed meds with pt verbally.    HPI:  [name] [name] is a 82 yo woman with a history of  Paroxysmal atrial fibrillation,  Morbid obesity,  symptomatic palpitations/PVCs,  pacemaker implantation for sick sinus syndrome/tachybradycardia syndrome with chronic symptoms of palpitations,  shortness of breath with exertion,  History of chronic fatigue who presents for routine followup of her atrial fibrillation and shortness of breath  In atrial fib 4 days, still in atrial fib today Wednesday Am, 5:30 Am, Symptoms started, Had rapid beat,  Rate 100 to 110 Lasted until Sunday AM, then palpitations less notable Continues to feel tired since Wednesday, legs feel weaker Weight is up several pounds in the last few days, now taking Lasix 20 twice a day rather than daily Takes with a banana  Last INR 3/23 and 4/9 INR >2, monitor to primary care  Takes coreg one in Am and 2 at PM as she was typically appreciating tachycardia at nighttime  Previously Seen by pulmonary, increased CPAP pressures up to 10  Taking flecainide, 50 mg twice a day,  Carotid u/s <39% b/l Thyroid ABN  Pacer download from March 2018 reviewed showing 2 episodes of atrial fibrillation longest episode 29 hours  EKG personally reviewed by myself on todays visit Shows atrial fibrillation with ventricular rate 70 bpm, paced complexes  Other past medical history Went to hospital 11/09/16, SOB, tingling, called 911  anemia, HCT 29 Had a CT scan, Possible anxiety Got hot  compliant with her CPAP.  Previous 30 day monitor concerning for episodes of atrial fibrillation  Hemoglobin A1c previously in the 8  range No regular exercise program Tolerating warfarin with no complications  Recent Pacer changes 10/15, felt better Takes bystolic 20 BID Wears compressions on a regular basis  Previously on HCTZ, had abd cramps, ABD pains (now stopped) Finger fungus, on lamacil daily  June 2013 total cholesterol 155, LDL 82, HDL 56  Previous hospital admission on 09/24/2013 for acute on chronic diastolic CHF. She was drinking a significant amount of fluids. She was treated with IV diuretics with improvement of her symptoms. Was recommended that she go home with Lasix every other day. She had a stress test while in the hospital showing no ischemia. Echocardiogram dated 09/24/2013 showing ejection fraction 50-55%,, mild to moderate tricuspid regurg, mild to moderate MR, normal RV function, normal right ventricular systolic pressure  ER again on 10/18/2013.  Previous hx of shortness of breath and palpitations. May 2012, she had adjustments to her pacemaker and since that time has felt back to normal. she has had long hx of hot flashes, and a racing heart rate when she is in bed. Recently this has been well-controlled on high-dose bystolic.  chronic back and hip pain. She did receive a cortisone shot on the left with no significant improvement. She reports that her sugars have been well-controlled, blood pressure well controlled on current medications  syncope episode August 2013 . in her bedroom folding clothes and the next thing she knew she found herself on the floor in the bathroom. She spent 2 days in the hospital, then at least 10 days in rehabilitation for significant left arm bruising  and she was on warfarin. Pacemaker was interrogated after the fact showing 85% atrial paced rhythm, no other arrhythmia. Etiology concerning for vasovagal episode, though exact cause is uncertain.  She has chronic buzzing in her ear. She does report this is a chronic issue going back many years. Prior workup  with ear nose throat. She does have prior exposure to loud noises, worked in a [employer].  cardiac catheterization in 2004, stress testing,  echo showed normal systolic function, mild LVH, diastolic relaxation abnormality, mildly elevated right ventricular systolic pressures, mild MR. treadmill study showing normal blood pressure response to exercise, chronotropic incompetence with peak heart rate of 100 beats per minute, resting heart rate in the 70s. Previous PFTs demonstrated extrinsic compression/restrictive physiology.  PMH:   has a past medical history of 1st degree AV block; Anemia; Anxiety; Arthritis; Chest pain; Chronic diastolic heart failure ([place]); Esophageal reflux; H/O hiatal hernia; History of UTI; Hyperlipidemia; Morbid obesity ([place]); OSA on CPAP; PAF (paroxysmal atrial fibrillation) ([place]); SSS (sick sinus syndrome) ([place]); Symptomatic PVCs; Syncope and collapse; Type II diabetes mellitus ([place]); Unspecified essential hypertension; and Unspecified glaucoma(365.9).  PSH:    Past Surgical History:  Procedure Laterality Date  . DILATION AND CURETTAGE OF UTERUS    . INCISION AND DRAINAGE OF WOUND Right 03/1998   "leg; it was like a boil" (12/01/2013)  . INSERT / REPLACE / REMOVE PACEMAKER  2008; 12/01/2013   MDT ADDRL1 pacemaker - gen change by Dr Caryl Comes 12/01/2013  . LUNG BIOPSY  2010   [facility]  . PERMANENT PACEMAKER GENERATOR CHANGE N/A 12/01/2013   Procedure: PERMANENT PACEMAKER GENERATOR CHANGE;  Surgeon: Deboraha Sprang, MD;  Location: [facility] CATH LAB;  Service: Cardiovascular;  Laterality: N/A;  . VAGINAL HYSTERECTOMY  1970    Current Outpatient Prescriptions  Medication Sig Dispense Refill  . acetaminophen (TYLENOL) 500 MG tablet Take 500 mg by mouth every 6 (six) hours as needed.    . ALPRAZolam (XANAX) 0.5 MG tablet Take 1 tablet (0.5 mg total) by mouth daily as needed. 90 tablet 0  . amLODipine (NORVASC) 10 MG tablet TAKE 1 TABLET (10 MG TOTAL) BY MOUTH DAILY. 90 tablet  0  . atorvastatin (LIPITOR) 20 MG tablet TAKE 1 TABLET (20 MG TOTAL) BY MOUTH DAILY. 90 tablet 0  . Brinzolamide-Brimonidine (SIMBRINZA) 1-0.2 % SUSP Place 1 drop into both eyes 2 (two) times daily. 1 Bottle 1  . carvedilol (COREG) 12.5 MG tablet Take 1 tablet in the morning with breakfast and 2 tablets pm daily by mouth in the evening before bed. 90 tablet 3  . doxycycline (VIBRA-TABS) 100 MG tablet Take 1 tablet (100 mg total) by mouth 2 (two) times daily. 14 tablet 0  . esomeprazole (NEXIUM) 40 MG capsule TAKE 1 CAPSULE (40 MG TOTAL) BY MOUTH DAILY. 90 capsule 1  . ferrous sulfate 325 (65 FE) MG EC tablet Take 325 mg by mouth daily.    . flecainide (TAMBOCOR) 50 MG tablet Take 1 tablet (50 mg total) by mouth 2 (two) times daily. 60 tablet 6  . furosemide (LASIX) 20 MG tablet Take 1 tablet (20 mg total) by mouth daily. 90 tablet 0  . gentamicin (GARAMYCIN) 0.3 % ophthalmic solution     . glipiZIDE (GLUCOTROL) 10 MG tablet TAKE 1 TABLET (10 MG TOTAL) BY MOUTH 2 (TWO) TIMES DAILY BEFORE A MEAL. 180 tablet 0  . metFORMIN (GLUCOPHAGE-XR) 500 MG 24 hr tablet TAKE 1 TABLET (500 MG TOTAL) BY MOUTH 2 (TWO) TIMES DAILY. [place]  tablet 0  . mometasone (ELOCON) 0.1 % cream Apply topically daily. Apply to affected area as directed two times a day 45 g 0  . Multiple Vitamins-Minerals (CENTRUM SILVER PO) Take by mouth daily.    Marland Kitchen olmesartan (BENICAR) 40 MG tablet TAKE 1 TABLET (40 MG TOTAL) BY MOUTH DAILY. 90 tablet 0  . terbinafine (LAMISIL) 250 MG tablet     . TRADJENTA 5 MG TABS tablet TAKE 1 TABLET (5 MG TOTAL) BY MOUTH DAILY. 30 tablet 2  . travoprost, benzalkonium, (TRAVATAN) 0.004 % ophthalmic solution Place 1 drop into both eyes at bedtime.     Marland Kitchen warfarin (COUMADIN) 3 MG tablet TAKE 1-1.5 TABLETS DAILY AT 6 PM. TAKE 1 TAB ON TUES, THURS, SAT, SUN. TAKE 4.5 MG ON MON, WED, FRI 100 tablet 3   No current facility-administered medications for this visit.      Allergies:   Metoprolol; Diltiazem;  Levofloxacin in d5w; and Levofloxacin   Social History:  The patient  reports that she quit smoking about 27 years ago. Her smoking use included Cigarettes. She has a 17.00 pack-year smoking history. She has never used smokeless tobacco. She reports that she does not drink alcohol or use drugs.   Family History:   family history includes Allergies in her father; Heart disease in her father; Liver cancer in her mother; Pancreatic cancer in her mother; Rheum arthritis in her father.    Review of Systems: Review of Systems  Constitutional: Negative.   HENT: Positive for congestion and sinus pain.   Respiratory: Negative.   Cardiovascular: Negative.   Gastrointestinal: Negative.   Musculoskeletal: Negative.   Neurological: Negative.   Psychiatric/Behavioral: Negative.   All other systems reviewed and are negative.    PHYSICAL EXAM: VS:  BP (!) 110/58 (BP Location: Left Arm, Patient Position: Sitting, Cuff Size: Large)   Pulse 70   Ht 5\' 4"  (1.626 m)   Wt 251 lb 12 oz (114.2 kg)   BMI 43.21 kg/m  , BMI Body mass index is 43.21 kg/m. GEN: Well nourished, well developed, in no acute distress, obese HEENT: normal  Neck: no JVD, carotid bruits, or masses Cardiac: Irregularly irregular,; no murmurs, rubs, or gallops, trace ankle edema bilaterally Respiratory:  clear to auscultation bilaterally, normal work of breathing GI: soft, nontender, nondistended, + BS MS: no deformity or atrophy  Skin: warm and dry, no rash Neuro:  Strength and sensation are intact Psych: euthymic mood, full affect    Recent Labs: 02/09/2017: ALT 20; Hemoglobin 9.6; Platelets 242 02/13/2017: BUN 17; Creat 1.48; Magnesium 1.7; Potassium 4.1; Sodium 142    Lipid Panel Lab Results  Component Value Date   CHOL 156 11/01/2016   HDL 58 11/01/2016   LDLCALC 79 11/01/2016   TRIG 93 11/01/2016      Wt Readings from Last 3 Encounters:  04/02/17 251 lb 12 oz (114.2 kg)  03/28/17 246 lb 14.4 oz (112 kg)   02/13/17 247 lb 12.8 oz (112.4 kg)       ASSESSMENT AND PLAN:   PVC (premature ventricular contraction) -  Improved on flecainide,  Paroxysmal atrial fibrillation (HCC) - Plan: EKG 12-Lead Currently on flecainide 50 twice a day Symptoms would indicate she has been in atrial fibrillation since last Wednesday, past 5 days She is in atrial fibrillation on today's visit Recommended she increase flecainide up to 100 mg twice a day stay on carvedilol 1 in the morning and 2 in the evening. Blood pressure low on today's visit  She will monitor her heart rhythm at home and if she does not restore normal sinus rhythm within the next week or so, she may need cardioversion  Hyperlipidemia, unspecified hyperlipidemia type Cholesterol is at goal on the current lipid regimen. No changes to the medications were made.  Acute on chronic diastolic CHF (congestive heart failure) (HCC) Weight is up several pounds since she has been in atrial fibrillation, she is now taking higher dose Lasix 20 twice a day  Controlled type 2 diabetes mellitus without complication, without long-term current use of insulin ([place]) We have encouraged continued exercise, careful diet management in an effort to lose weight.  Cardiac pacemaker - [employer] Followed by EP in [place], Dr. Lynetta Mare Stable, chronic Recommended weight loss, exercise program   Total encounter time more than 25 minutes  Greater than 50% was spent in counseling and coordination of care with the patient   Disposition:   F/U  1 month    Orders Placed This Encounter  Procedures  . EKG 12-Lead     Signed, Esmond Plants, M.D., Ph.D. 04/02/2017  [place], [place]

## 2017-04-03 ENCOUNTER — Ambulatory Visit (INDEPENDENT_AMBULATORY_CARE_PROVIDER_SITE_OTHER): Payer: Medicare Other | Admitting: Internal Medicine

## 2017-04-03 ENCOUNTER — Encounter: Payer: Self-pay | Admitting: Internal Medicine

## 2017-04-03 VITALS — BP 124/58 | HR 76 | Resp 16 | Ht 65.0 in | Wt 250.0 lb

## 2017-04-03 DIAGNOSIS — H9319 Tinnitus, unspecified ear: Secondary | ICD-10-CM | POA: Insufficient documentation

## 2017-04-03 DIAGNOSIS — K449 Diaphragmatic hernia without obstruction or gangrene: Secondary | ICD-10-CM | POA: Insufficient documentation

## 2017-04-03 DIAGNOSIS — I38 Endocarditis, valve unspecified: Secondary | ICD-10-CM | POA: Insufficient documentation

## 2017-04-03 DIAGNOSIS — M1990 Unspecified osteoarthritis, unspecified site: Secondary | ICD-10-CM | POA: Insufficient documentation

## 2017-04-03 DIAGNOSIS — G4733 Obstructive sleep apnea (adult) (pediatric): Secondary | ICD-10-CM

## 2017-04-03 DIAGNOSIS — D509 Iron deficiency anemia, unspecified: Secondary | ICD-10-CM | POA: Insufficient documentation

## 2017-04-03 DIAGNOSIS — I878 Other specified disorders of veins: Secondary | ICD-10-CM | POA: Insufficient documentation

## 2017-04-03 DIAGNOSIS — G2581 Restless legs syndrome: Secondary | ICD-10-CM | POA: Diagnosis not present

## 2017-04-03 DIAGNOSIS — H409 Unspecified glaucoma: Secondary | ICD-10-CM | POA: Insufficient documentation

## 2017-04-03 DIAGNOSIS — E7800 Pure hypercholesterolemia, unspecified: Secondary | ICD-10-CM | POA: Insufficient documentation

## 2017-04-03 DIAGNOSIS — L309 Dermatitis, unspecified: Secondary | ICD-10-CM | POA: Insufficient documentation

## 2017-04-03 NOTE — Patient Instructions (Signed)
--  Continue current medications and using cpap every night.

## 2017-04-04 ENCOUNTER — Encounter: Payer: Medicare Other | Attending: Cardiovascular Disease

## 2017-04-04 DIAGNOSIS — Z781 Physical restraint status: Secondary | ICD-10-CM | POA: Insufficient documentation

## 2017-04-04 DIAGNOSIS — I5032 Chronic diastolic (congestive) heart failure: Secondary | ICD-10-CM

## 2017-04-04 DIAGNOSIS — R0602 Shortness of breath: Secondary | ICD-10-CM | POA: Diagnosis present

## 2017-04-04 NOTE — Progress Notes (Signed)
Daily Session Note  Patient Details  Name: [name] [name] MRN: [id number] Date of Birth: 06/27/1935 Referring Provider:     Pulmonary Rehab from 12/19/2016 in [facility]  Referring Provider  [place]      Encounter Date: 04/04/2017  Check In:     Session Check In - 04/04/17 1048      Check-In   Location [facility]   Staff Present Jessica [place], MA, ACSM RCEP, Exercise Physiologist;Laureen Brown, BS, RRT, Respiratory Therapist;Amanda Sommer, BA, ACSM CEP, Exercise Physiologist   Supervising physician immediately available to respond to emergencies See telemetry face sheet for immediately available ER MD   Physician(s) Norman and Lord   Medication changes reported     No   Fall or balance concerns reported    No   Warm-up and Cool-down Performed as group-led instruction   Resistance Training Performed Yes   VAD Patient? No     Pain Assessment   Currently in Pain? No/denies         History  Smoking Status  . Former Smoker  . Packs/day: 0.50  . Years: 34.00  . Types: Cigarettes  . Quit date: 08/15/1989  Smokeless Tobacco  . Never Used    Goals Met:  Proper associated with RPD/PD & O2 Sat Independence with exercise equipment Exercise tolerated well Strength training completed today  Goals Unmet:  Not Applicable  Comments: Pt able to follow exercise prescription today without complaint.  Will continue to monitor for progression.    Dr. Mark Miller is Medical Director for [facility] and [facility]. 

## 2017-04-06 ENCOUNTER — Encounter: Payer: Medicare Other | Admitting: *Deleted

## 2017-04-06 DIAGNOSIS — I5032 Chronic diastolic (congestive) heart failure: Secondary | ICD-10-CM | POA: Diagnosis not present

## 2017-04-06 NOTE — Progress Notes (Signed)
Daily Session Note  Patient Details  Name: [name] [name] MRN: [id number] Date of Birth: 10/13/1935 Referring Provider:     Pulmonary Rehab from 12/19/2016 in [facility]  Referring Provider  G[place]an      Encounter Date: 04/06/2017  Check In:     Session Check In - 04/06/17 1110      Check-In   Location [facility]   Staff Present  , RN, BSN;Amanda Sommer, BA, ACSM CEP, Exercise Physiologist;Other  Krista Spencer, RN   Supervising physician immediately available to respond to emergencies [facility] immediately available ER MD   Physician(s) Schaevitz and Kinner   Medication changes reported     No   Fall or balance concerns reported    No   Tobacco Cessation No Change   Warm-up and Cool-down Performed as group-led instruction   Resistance Training Performed Yes   VAD Patient? No     Pain Assessment   Currently in Pain? No/denies         History  Smoking Status  . Former Smoker  . Packs/day: 0.50  . Years: 34.00  . Types: Cigarettes  . Quit date: 08/15/1989  Smokeless Tobacco  . Never Used    Goals Met:  Proper associated with RPD/PD & O2 Sat Exercise tolerated well  Goals Unmet:  Not Applicable  Comments:     Dr. Mark Miller is Medical Director for [facility] and [facility]. 

## 2017-04-09 ENCOUNTER — Encounter: Payer: Medicare Other | Admitting: *Deleted

## 2017-04-09 ENCOUNTER — Encounter: Payer: Self-pay | Admitting: Respiratory Therapy

## 2017-04-09 DIAGNOSIS — I5032 Chronic diastolic (congestive) heart failure: Secondary | ICD-10-CM | POA: Diagnosis not present

## 2017-04-09 NOTE — Patient Instructions (Signed)
Discharge Instructions  Patient Details  Name: [name] [name] MRN: [id number] Date of Birth: 1935/05/30 Referring Provider:  Minna Merritts, MD   Number of Visits: 41 Reason for Discharge:  Patient reached a stable level of exercise. Patient independent in their exercise.  Smoking History:  History  Smoking Status   Former Smoker   Packs/day: 0.50   Years: 34.00   Types: Cigarettes   Quit date: 08/15/1989  Smokeless Tobacco   Never Used    Diagnosis:  Chronic diastolic congestive heart failure ([place])  Initial Exercise Prescription:     Initial Exercise Prescription - 12/19/16 1200      Date of Initial Exercise RX and Referring Provider   Date 12/19/16   Referring Provider Gollan     Treadmill   MPH 1.5   Grade 0.5   Minutes 15   METs 2.25     Recumbant Bike   Level 1   RPM 60   Minutes 15   METs 2     NuStep   Level 3   Minutes 15   METs 2.25     Biostep-RELP   Level 2   Minutes 15   METs 2     Prescription Details   Frequency (times per week) 3   Duration Progress to 45 minutes of aerobic exercise without signs/symptoms of physical distress     Intensity   THRR 40-80% of Max Heartrate 97-126   Ratings of Perceived Exertion 11-13   Perceived Dyspnea 0-4     Progression   Progression Continue to progress workloads to maintain intensity without signs/symptoms of physical distress.     Resistance Training   Training Prescription Yes   Weight 3   Reps 10-15      Discharge Exercise Prescription (Final Exercise Prescription Changes):     Exercise Prescription Changes - 03/28/17 1200      Response to Exercise   Blood Pressure (Admit) 132/75   Blood Pressure (Exercise) 142/66   Blood Pressure (Exit) 124/70   Heart Rate (Admit) 90 bpm   Heart Rate (Exercise) 128 bpm   Heart Rate (Exit) 61 bpm   Oxygen Saturation (Admit) 97 %   Oxygen Saturation (Exercise) 91 %   Oxygen Saturation (Exit) 97 %   Rating of Perceived Exertion  (Exercise) 13   Perceived Dyspnea (Exercise) 1   Duration Progress to 45 minutes of aerobic exercise without signs/symptoms of physical distress   Intensity THRR unchanged     Progression   Progression Continue to progress workloads to maintain intensity without signs/symptoms of physical distress.     Resistance Training   Training Prescription Yes   Weight 2   Reps 10-15     Interval Training   Interval Training No     Biostep-RELP   Level 4   Minutes 15   METs 2      Functional Capacity:     6 Minute Walk    Row Name 12/19/16 1201 02/26/17 1250 03/28/17 1205     6 Minute Walk   Phase  -- Mid Program Discharge   Distance 965 feet 985 feet 1210 feet   Distance % Change  --  -- 25 %   Walk Time 6 minutes 6 minutes 6 minutes   # of Rest Breaks 0 0 0   MPH 1.82 1.87 2.29   METS 2.38 2.45 2.8   RPE 15 13 13    Perceived Dyspnea  2 2 1    VO2 Peak 5.43 8.5  9.6   Symptoms No Yes (comment)  back tightness No   Resting HR 68 bpm 83 bpm 90 bpm   Resting BP 122/66 120/76 132/74   Max Ex. HR 113 bpm 107 bpm 128 bpm   Max Ex. BP 172/72 152/80 142/66     Interval HR   Baseline HR 68 83 90   1 Minute HR 110 105 100   2 Minute HR 104 107 104   3 Minute HR 102 104  --   4 Minute HR  -- 103 128   5 Minute HR 98 102 106   6 Minute HR 113 102 99   2 Minute Post HR  -- 91  --   Interval Heart Rate? Yes Yes  --     Interval Oxygen   Interval Oxygen? Yes Yes  --   Baseline Oxygen Saturation % 97 % 97 % 97 %   Baseline Liters of Oxygen  -- 0 L  --   1 Minute Oxygen Saturation % 96 % 96 % 96 %   1 Minute Liters of Oxygen  -- 0 L  --   2 Minute Oxygen Saturation % 94 % 97 % 94 %   2 Minute Liters of Oxygen  -- 0 L  --   3 Minute Oxygen Saturation % 95 % 97 %  --   3 Minute Liters of Oxygen  -- 0 L  --   4 Minute Oxygen Saturation %  -- 95 % 95 %   4 Minute Liters of Oxygen  -- 0 L  --   5 Minute Oxygen Saturation % 96 % 95 % 95 %   5 Minute Liters of Oxygen  -- 0 L  --    6 Minute Oxygen Saturation % 95 % 95 % 96 %   6 Minute Liters of Oxygen  -- 0 L  --   2 Minute Post Oxygen Saturation %  -- 98 %  --   2 Minute Post Liters of Oxygen  -- 0 L  --      Quality of Life:     Quality of Life - 03/30/17 1136      Quality of Life Scores   Health/Function Pre 21 %   Health/Function Post 26.53 %   Health/Function % Change 26.33 %   Socioeconomic Pre 21 %   Socioeconomic Post 28.44 %   Socioeconomic % Change  35.43 %   Psych/Spiritual Pre 20.86 %   Psych/Spiritual Post 28.93 %   Psych/Spiritual % Change 38.69 %   Family Pre 20.4 %   Family Post 27.6 %   Family % Change 35.29 %   GLOBAL Pre 20.71 %   GLOBAL Post 27.57 %   GLOBAL % Change 33.12 %      Personal Goals: Goals established at orientation with interventions provided to work toward goal.     Personal Goals and Risk Factors at Admission - 12/19/16 1201      Core Components/Risk Factors/Patient Goals on Admission    Weight Management Yes;Weight Loss   Intervention Weight Management: Develop a combined nutrition and exercise program designed to reach desired caloric intake, while maintaining appropriate intake of nutrient and fiber, sodium and fats, and appropriate energy expenditure required for the weight goal.;Weight Management: Provide education and appropriate resources to help participant work on and attain dietary goals.;Weight Management/Obesity: Establish reasonable short term and long term weight goals.;Obesity: Provide education and appropriate resources to help participant work on and  attain dietary goals.   Admit Weight 242 lb 12.8 oz (110.1 kg)   Goal Weight: Short Term 237 lb (107.5 kg)   Goal Weight: Long Term 190 lb (86.2 kg)   Expected Outcomes Short Term: Continue to assess and modify interventions until short term weight is achieved;Long Term: Adherence to nutrition and physical activity/exercise program aimed toward attainment of established weight goal;Weight Maintenance:  Understanding of the daily nutrition guidelines, which includes 25-35% calories from fat, 7% or less cal from saturated fats, less than 200mg  cholesterol, less than 1.5gm of sodium, & 5 or more servings of fruits and vegetables daily;Weight Loss: Understanding of general recommendations for a balanced deficit meal plan, which promotes 1-2 lb weight loss per week and includes a negative energy balance of [phone] kcal/d;Understanding recommendations for meals to include 15-35% energy as protein, 25-35% energy from fat, 35-60% energy from carbohydrates, less than 200mg  of dietary cholesterol, 20-35 gm of total fiber daily;Understanding of distribution of calorie intake throughout the day with the consumption of 4-5 meals/snacks   Sedentary Yes  Participates in Silver Sneakers class at [employer], education, support and counseling about physical activity/exercise needs.;Develop an individualized exercise prescription for aerobic and resistive training based on initial evaluation findings, risk stratification, comorbidities and participant's personal goals.   Expected Outcomes Achievement of increased cardiorespiratory fitness and enhanced flexibility, muscular endurance and strength shown through measurements of functional capacity and personal statement of participant.   Increase Strength and Stamina Yes   Intervention Provide advice, education, support and counseling about physical activity/exercise needs.;Develop an individualized exercise prescription for aerobic and resistive training based on initial evaluation findings, risk stratification, comorbidities and participant's personal goals.   Expected Outcomes Achievement of increased cardiorespiratory fitness and enhanced flexibility, muscular endurance and strength shown through measurements of functional capacity and personal statement of participant.   Improve shortness of breath with ADL's Yes   Intervention Provide education,  individualized exercise plan and daily activity instruction to help decrease symptoms of SOB with activities of daily living.   Expected Outcomes Short Term: Achieves a reduction of symptoms when performing activities of daily living.   Develop more efficient breathing techniques such as purse lipped breathing and diaphragmatic breathing; and practicing self-pacing with activity Yes   Intervention Provide education, demonstration and support about specific breathing techniuqes utilized for more efficient breathing. Include techniques such as pursed lipped breathing, diaphragmatic breathing and self-pacing activity.   Expected Outcomes Short Term: Participant will be able to demonstrate and use breathing techniques as needed throughout daily activities.   Increase knowledge of respiratory medications and ability to use respiratory devices properly  Yes  CPAP at [place] education and demonstration as needed of appropriate use of medications, inhalers, and oxygen therapy.   Expected Outcomes Short Term: Achieves understanding of medications use. Understands that oxygen is a medication prescribed by physician. Demonstrates appropriate use of inhaler and oxygen therapy.   Diabetes Yes   Intervention Provide education about signs/symptoms and action to take for hypo/hyperglycemia.;Provide education about proper nutrition, including hydration, and aerobic/resistive exercise prescription along with prescribed medications to achieve blood glucose in normal ranges: Fasting glucose 65-99 mg/dL   Expected Outcomes Short Term: Participant verbalizes understanding of the signs/symptoms and immediate care of hyper/hypoglycemia, proper foot care and importance of medication, aerobic/resistive exercise and nutrition plan for blood glucose control.;Long Term: Attainment of HbA1C < 7%.   Heart Failure Yes   Intervention Provide a combined exercise and nutrition  program that is supplemented with  education, support and counseling about heart failure. Directed toward relieving symptoms such as shortness of breath, decreased exercise tolerance, and extremity edema.   Expected Outcomes Improve functional capacity of life;Short term: Attendance in program 2-3 days a week with increased exercise capacity. Reported lower sodium intake. Reported increased fruit and vegetable intake. Reports medication compliance.;Short term: Daily weights obtained and reported for increase. Utilizing diuretic protocols set by physician.;Long term: Adoption of self-care skills and reduction of barriers for early signs and symptoms recognition and intervention leading to self-care maintenance.   Hypertension Yes   Intervention Provide education on lifestyle modifcations including regular physical activity/exercise, weight management, moderate sodium restriction and increased consumption of fresh fruit, vegetables, and low fat dairy, alcohol moderation, and smoking cessation.;Monitor prescription use compliance.   Expected Outcomes Short Term: Continued assessment and intervention until BP is < 140/67mm HG in hypertensive participants. < 130/50mm HG in hypertensive participants with diabetes, heart failure or chronic kidney disease.;Long Term: Maintenance of blood pressure at goal levels.   Lipids Yes   Intervention Provide education and support for participant on nutrition & aerobic/resistive exercise along with prescribed medications to achieve LDL 70mg , HDL >40mg .   Expected Outcomes Short Term: Participant states understanding of desired cholesterol values and is compliant with medications prescribed. Participant is following exercise prescription and nutrition guidelines.;Long Term: Cholesterol controlled with medications as prescribed, with individualized exercise RX and with personalized nutrition plan. Value goals: LDL < 70mg , HDL > 40 mg.       Personal Goals Discharge:     Goals and Risk Factor Review -  04/09/17 1502      Core Components/Risk Factors/Patient Goals Review   Review Ms Milford graduates this next session. She has increased her exercise goals and plans to continue her exercise at the [facility]. She exercises with her sisters, and they participate in the Silver Sneakers class. Plus she would like to add some equipment exercise. Living very close to the [facility] outside walking track, Ms Wilhelmi plans to add that exercise as well. Ms Backes improved her post 66mwd by 227ft. Her shortness of breath improved from 21 to 12 - Minimal Important Difference is 5. Ms Friedl is commended for her regular attendence and hard work.    Expected Outcomes Continue exercising and self-management of her CHF by applying her knowledge learned from [place] education.      Nutrition & Weight - Outcomes:     Pre Biometrics - 12/19/16 1159      Pre Biometrics   Height 5\' 5"  (1.651 m)   Weight 242 lb 12.8 oz (110.1 kg)   Waist Circumference 47 inches   Hip Circumference 51.5 inches   Waist to Hip Ratio 0.91 %   BMI (Calculated) 40.5         Post Biometrics - 03/28/17 1204       Post  Biometrics   Height 5\' 5"  (1.651 m)   Weight 246 lb 14.4 oz (112 kg)   Waist Circumference 46.5 inches   Hip Circumference 53.5 inches   Waist to Hip Ratio 0.87 %   BMI (Calculated) 41.2      Nutrition:   Nutrition Discharge:   Education Questionnaire Score:     Knowledge Questionnaire Score - 03/28/17 1610      Knowledge Questionnaire Score   Post Score 10/10      Goals reviewed with patient; copy given to patient.

## 2017-04-09 NOTE — Progress Notes (Signed)
Pulmonary Individual Treatment Plan  Patient Details  Name: [name] [name] MRN: [id number] Date of Birth: Jul 13, 1935 Referring Provider:     Pulmonary Rehab from 12/19/2016 in [facility] Re[facility]  Referring Provider  Gollan      Initial Encounter Date:    Pulmonary Rehab from 12/19/2016 in [facility] Cardiac and Pulmonary Rehab  Date  12/19/16  Referring Provider  Rockey Situ      Visit Diagnosis: Chronic diastolic congestive heart failure ([place])  Patient's Home Medications on Admission:  Current Outpatient Prescriptions:    acetaminophen (TYLENOL) 500 MG tablet, Take 500 mg by mouth every 6 (six) hours as needed., Disp: , Rfl:    ALPRAZolam (XANAX) 0.5 MG tablet, Take 1 tablet (0.5 mg total) by mouth daily as needed., Disp: 90 tablet, Rfl: 0   amLODipine (NORVASC) 10 MG tablet, TAKE 1 TABLET (10 MG TOTAL) BY MOUTH DAILY., Disp: 90 tablet, Rfl: 0   atorvastatin (LIPITOR) 20 MG tablet, TAKE 1 TABLET (20 MG TOTAL) BY MOUTH DAILY., Disp: 90 tablet, Rfl: 0   Brinzolamide-Brimonidine (SIMBRINZA) 1-0.2 % SUSP, Place 1 drop into both eyes 2 (two) times daily., Disp: 1 Bottle, Rfl: 1   carvedilol (COREG) 12.5 MG tablet, Take 1 tablet in the morning with breakfast and 2 tablets pm daily by mouth in the evening before bed., Disp: 90 tablet, Rfl: 3   esomeprazole (NEXIUM) 40 MG capsule, TAKE 1 CAPSULE (40 MG TOTAL) BY MOUTH DAILY., Disp: 90 capsule, Rfl: 1   ferrous sulfate 325 (65 FE) MG EC tablet, Take 325 mg by mouth daily., Disp: , Rfl:    flecainide (TAMBOCOR) 100 MG tablet, Take 1 tablet (100 mg total) by mouth 2 (two) times daily., Disp: 60 tablet, Rfl: 6   furosemide (LASIX) 20 MG tablet, Take 1 tablet (20 mg total) by mouth daily., Disp: 90 tablet, Rfl: 0   gentamicin (GARAMYCIN) 0.3 % ophthalmic solution, , Disp: , Rfl:    glipiZIDE (GLUCOTROL) 10 MG tablet, TAKE 1 TABLET (10 MG TOTAL) BY MOUTH 2 (TWO) TIMES DAILY BEFORE A MEAL., Disp: 180 tablet, Rfl: 0   metFORMIN  (GLUCOPHAGE-XR) 500 MG 24 hr tablet, TAKE 1 TABLET (500 MG TOTAL) BY MOUTH 2 (TWO) TIMES DAILY., Disp: 180 tablet, Rfl: 0   mometasone (ELOCON) 0.1 % cream, Apply topically daily. Apply to affected area as directed two times a day, Disp: 45 g, Rfl: 0   Multiple Vitamins-Minerals (CENTRUM SILVER PO), Take by mouth daily., Disp: , Rfl:    olmesartan (BENICAR) 40 MG tablet, TAKE 1 TABLET (40 MG TOTAL) BY MOUTH DAILY., Disp: 90 tablet, Rfl: 0   TRADJENTA 5 MG TABS tablet, TAKE 1 TABLET (5 MG TOTAL) BY MOUTH DAILY., Disp: 30 tablet, Rfl: 2   travoprost, benzalkonium, (TRAVATAN) 0.004 % ophthalmic solution, Place 1 drop into both eyes at bedtime. , Disp: , Rfl:    warfarin (COUMADIN) 3 MG tablet, TAKE 1-1.5 TABLETS DAILY AT 6 PM. TAKE 1 TAB ON TUES, THURS, SAT, SUN. TAKE 4.5 MG ON MON, WED, FRI, Disp: 100 tablet, Rfl: 3  Past Medical History: Past Medical History:  Diagnosis Date   1st degree AV block    a. PR 400 msec with Apacing   Anemia    Anxiety    Arthritis    a. shoulders.   Chest pain    a. 2004 Cath:  reportedly nl;  b. 09/2013 Neg MV.   Chronic diastolic heart failure ([place])    a. 09/2013 Echo: EF 50-55%, mild  LVH, mild to mod MR/TR.   Esophageal reflux    H/O hiatal hernia    History of UTI    Hyperlipidemia    Morbid obesity ([place])    OSA on CPAP    PAF (paroxysmal atrial fibrillation) (HCC)    SSS (sick sinus syndrome) ([place])    a. 2008 s/p MDT PPM;  b. 11/2013 Gen change- MDT ADDRL1 Adapta DC PPM, ser # [id number].   Symptomatic PVCs    Syncope and collapse    a. Felt to be vasovagal.   Type II diabetes mellitus (HCC)    Unspecified essential hypertension    Unspecified glaucoma(365.9)     Tobacco Use: History  Smoking Status   Former Smoker   Packs/day: 0.50   Years: 34.00   Types: Cigarettes   Quit date: 08/15/1989  Smokeless Tobacco   Never Used    Labs: Recent Review Flowsheet Data    Labs for ITP Cardiac and Pulmonary Rehab  Latest Ref Rng & Units 12/16/2015 03/15/2016 07/28/2016 11/01/2016 02/02/2017   Cholestrol <200 mg/dL 153 144 - 156 -   LDLCALC <100 mg/dL 72 67 - 79 -   HDL >50 mg/dL 61 49 - 58 -   Trlycerides <150 mg/dL 100 138 - 93 -   Hemoglobin A1c - 7.8 8.2 6.8 6.8 6.7       ADL UCSD:     Pulmonary Assessment Scores    Row Name 12/19/16 1158 03/02/17 1058 03/30/17 1136     ADL UCSD   ADL Phase Entry Mid Exit   SOB Score total _0 Rest 1 0 1   Walk 0 1 0   Stairs _1 Bath 0 1 0   Dress 0 0 0   Shop 0 0 1      Pulmonary Function Assessment:     Pulmonary Function Assessment - 12/19/16 1157      Initial Spirometry Results   FVC% 85 %   FEV1% 101 %   FEV1/FVC Ratio 91.51     Breath   Bilateral Breath Sounds Clear   Shortness of Breath Yes      Exercise Target Goals:    Exercise Program Goal: Individual exercise prescription set with THRR, safety & activity barriers. Participant demonstrates ability to understand and report RPE using BORG scale, to self-measure pulse accurately, and to acknowledge the importance of the exercise prescription.  Exercise Prescription Goal: Starting with aerobic activity 30 plus minutes a day, 3 days per week for initial exercise prescription. Provide home exercise prescription and guidelines that participant acknowledges understanding prior to discharge.  Activity Barriers & Risk Stratification:     Activity Barriers & Cardiac Risk Stratification - 12/19/16 1157      Activity Barriers & Cardiac Risk Stratification   Activity Barriers Arthritis;Back Problems;Joint Problems;Shortness of Breath;Deconditioning;Muscular Weakness      6 Minute Walk:     6 Minute Walk    Row Name 12/19/16 1201 02/26/17 1250 03/28/17 1205     6 Minute Walk   Phase  -- Mid Program Discharge   Distance 965 feet 985 feet 1210 feet   Distance % Change  --  -- 25 %   Walk Time 6 minutes 6 minutes 6 minutes   # of Rest Breaks 0 0 0   MPH 1.82 1.87  2.29   METS 2.38 2.45 2.8   RPE _2 Perceived Dyspnea  _3 VO2  Peak 5.43 8.5 9.6   Symptoms No Yes (comment)  back tightness No   Resting HR 68 bpm 83 bpm 90 bpm   Resting BP 122/66 120/76 132/74   Max Ex. HR 113 bpm 107 bpm 128 bpm   Max Ex. BP 172/72 152/80 142/66     Interval HR   Baseline HR 68 83 90   1 Minute HR 110 105 100   2 Minute HR 104 107 104   3 Minute HR 102 104  --   4 Minute HR  -- 103 128   5 Minute HR 98 102 106   6 Minute HR 113 102 99   2 Minute Post HR  -- 91  --   Interval Heart Rate? Yes Yes  --     Interval Oxygen   Interval Oxygen? Yes Yes  --   Baseline Oxygen Saturation % 97 % 97 % 97 %   Baseline Liters of Oxygen  -- 0 L  --   1 Minute Oxygen Saturation % 96 % 96 % 96 %   1 Minute Liters of Oxygen  -- 0 L  --   2 Minute Oxygen Saturation % 94 % 97 % 94 %   2 Minute Liters of Oxygen  -- 0 L  --   3 Minute Oxygen Saturation % 95 % 97 %  --   3 Minute Liters of Oxygen  -- 0 L  --   4 Minute Oxygen Saturation %  -- 95 % 95 %   4 Minute Liters of Oxygen  -- 0 L  --   5 Minute Oxygen Saturation % 96 % 95 % 95 %   5 Minute Liters of Oxygen  -- 0 L  --   6 Minute Oxygen Saturation % 95 % 95 % 96 %   6 Minute Liters of Oxygen  -- 0 L  --   2 Minute Post Oxygen Saturation %  -- 98 %  --   2 Minute Post Liters of Oxygen  -- 0 L  --     Oxygen Initial Assessment:     Oxygen Initial Assessment - 03/20/17 0656      Home Oxygen   Home Oxygen Device None      Oxygen Re-Evaluation:     Oxygen Re-Evaluation    Row Name 03/20/17 0717             Program Oxygen Prescription   Program Oxygen Prescription None         Home Oxygen   Home Oxygen Device None          Oxygen Discharge (Final Oxygen Re-Evaluation):     Oxygen Re-Evaluation - 03/20/17 0717      Program Oxygen Prescription   Program Oxygen Prescription None     Home Oxygen   Home Oxygen Device None      Initial Exercise Prescription:     Initial  Exercise Prescription - 12/19/16 1200      Date of Initial Exercise RX and Referring Provider   Date 12/19/16   Referring Provider Gollan     Treadmill   MPH 1.5   Grade 0.5   Minutes 15   METs 2.25     Recumbant Bike   Level 1   RPM 60   Minutes 15   METs 2     NuStep   Level 3   Minutes 15   METs 2.25     Biostep-RELP   Level  2   Minutes 15   METs 2     Prescription Details   Frequency (times per week) 3   Duration Progress to 45 minutes of aerobic exercise without signs/symptoms of physical distress     Intensity   THRR 40-80% of Max Heartrate 97-126   Ratings of Perceived Exertion 11-13   Perceived Dyspnea 0-4     Progression   Progression Continue to progress workloads to maintain intensity without signs/symptoms of physical distress.     Resistance Training   Training Prescription Yes   Weight 3   Reps 10-15      Perform Capillary Blood Glucose checks as needed.  Exercise Prescription Changes:     Exercise Prescription Changes    Row Name 12/27/16 1200 01/05/17 0900 01/10/17 1100 01/18/17 1100 02/02/17 1200     Response to Exercise   Blood Pressure (Admit) 112/54 134/70  -- 130/66 134/60   Blood Pressure (Exercise) 124/66 148/82  -- 128/70 126/84   Blood Pressure (Exit) 108/50 120/74  -- 108/58 124/64   Heart Rate (Admit) 79 bpm 79 bpm  -- 67 bpm 80 bpm   Heart Rate (Exercise) 77 bpm 68 bpm  -- 100 bpm 76 bpm   Heart Rate (Exit) 71 bpm 60 bpm  -- 69 bpm 67 bpm   Oxygen Saturation (Admit) 98 % 98 %  -- 97 % 97 %   Oxygen Saturation (Exercise) 98 % 98 %  -- 96 % 97 %   Oxygen Saturation (Exit) 100 % 99 %  -- 99 % 99 %   Rating of Perceived Exertion (Exercise) 13 13  -- 11 13   Perceived Dyspnea (Exercise) 2 3  -- 2 2   Comments  --  -- Home Exercise Guidelines given 01/10/17  --  --   Duration Progress to 45 minutes of aerobic exercise without signs/symptoms of physical distress Progress to 45 minutes of aerobic exercise without signs/symptoms of  physical distress Progress to 45 minutes of aerobic exercise without signs/symptoms of physical distress Progress to 45 minutes of aerobic exercise without signs/symptoms of physical distress Progress to 45 minutes of aerobic exercise without signs/symptoms of physical distress   Intensity _0      Progression   Progression Continue to progress workloads to maintain intensity without signs/symptoms of physical distress. Continue to progress workloads to maintain intensity without signs/symptoms of physical distress. Continue to progress workloads to maintain intensity without signs/symptoms of physical distress. Continue to progress workloads to maintain intensity without signs/symptoms of physical distress. Continue to progress workloads to maintain intensity without signs/symptoms of physical distress.     Resistance Training   Training Prescription _1    Weight _2 Reps 10-15 10-15 10-15 10-15  --     Treadmill   MPH 1  --  -- 1.7  --   Grade 0  --  -- 0  --   Minutes 15  --  -- 15  --   METs 1.77  --  -- 2.3  --     NuStep   Level _3 Minutes _4 METs  --  --  -- 2.3 2.3     Biostep-RELP   Level  -- _5 Minutes  -- _6 METs  -- 2 2  -- 2  Home Exercise Plan   Plans to continue exercise at  --  -- [employer] (comment)  [employer] at [employer] and walking  --  --   Frequency  --  -- Add 2 additional days to program exercise sessions.  --  --     Exercise Review   Progression  --  --  -- Yes  --   Row Name 02/15/17 1300 02/28/17 1200 03/14/17 1200 03/28/17 1200       Response to Exercise   Blood Pressure (Admit) 126/70 126/70 124/70 132/75    Blood Pressure (Exercise) 158/80 130/60 130/84 142/66    Blood Pressure (Exit) 136/70 134/66 118/56 124/70    Heart Rate (Admit) 63 bpm 76 bpm 69 bpm 90 bpm    Heart Rate (Exercise) 85 bpm 95 bpm  88 bpm 128 bpm    Heart Rate (Exit) 70 bpm 71 bpm 62 bpm 61 bpm    Oxygen Saturation (Admit) 96 % 96 % 96 % 97 %    Oxygen Saturation (Exercise) 98 % 95 % 96 % 91 %    Oxygen Saturation (Exit) 97 % 97 % 98 % 97 %    Rating of Perceived Exertion (Exercise) _0 Perceived Dyspnea (Exercise) _1 Duration Progress to 45 minutes of aerobic exercise without signs/symptoms of physical distress Progress to 45 minutes of aerobic exercise without signs/symptoms of physical distress Progress to 45 minutes of aerobic exercise without signs/symptoms of physical distress Progress to 45 minutes of aerobic exercise without signs/symptoms of physical distress    Intensity THRR unchanged THRR unchanged THRR unchanged THRR unchanged      Progression   Progression Continue to progress workloads to maintain intensity without signs/symptoms of physical distress. Continue to progress workloads to maintain intensity without signs/symptoms of physical distress. Continue to progress workloads to maintain intensity without signs/symptoms of physical distress. Continue to progress workloads to maintain intensity without signs/symptoms of physical distress.      Resistance Training   Training Prescription Yes Yes Yes Yes    Weight _2 Reps  -- 10-15 10-15 10-15      Interval Training   Interval Training No No  -- No      Treadmill   MPH  -- 1.4 1.8  --    Grade  -- 0 0  --    Minutes  -- 15 15  --    METs  -- 2.07 2.38  --      NuStep   Level 4 3  --  --    Minutes 15 15  --  --    METs 2.6 2  --  --      Biostep-RELP   Level  -- _3 Minutes  -- _4 METs  -- _5 Exercise Comments:     Exercise Comments    Row Name 12/29/16 1115 01/05/17 0946 01/10/17 1157 01/18/17 1146 02/02/17 1226   Exercise Comments Ms. Karnik was unable to follow exercise prescription today, due to pt became dizzy on the treadmill.  She was advised to sit in chair, where we obtained BP  and blood glucose.  BP 140/60 and BG 111.  Pt sat for 5 minutes and stated she felt better and continued on to next machine, T4.  Pt did well with no other  complaints today.  Will continue to monitor for progression. Dalyah has tolerated exrecise very well in her first week of exercise. Reviewed home exercise with pt today.  Pt plans to use [employer] at [facility] and walk at home for exercise.  Reviewed THR, pulse, RPE, sign and symptoms, NTG use, and when to call 911 or MD.  Also discussed weather considerations and indoor options.  Pt voiced understanding. Dalma has made significant improvement walking on TM.  Staff will continue to monitor progression. Sienna was absent last week but continues to tolerate exercise well.   [place] Name 02/15/17 1310 02/28/17 1227 03/14/17 1306 03/28/17 1210     Exercise Comments Goldie has been out due to low magnesium and dehydration.  She plans to return Monday 3/19. Olamide continues to tolerate exercise well. Charlita is back to her previous exercise levels since being absent.  Starkeisha improved her post 5 min walk by 25%!       Exercise Goals and Review:   Exercise Goals Re-Evaluation :     Exercise Goals Re-Evaluation    Row Name 02/26/17 1254             Exercise Goal Re-Evaluation   Exercise Goals Review Increase Strenth and Stamina;Increase Physical Activity       Comments Tykerria stated she was feeling better and was able to do more at home. She reported that she can now bend over to pick things up without getting dizzy and was suprised that she was able to cook breakfast and clean it all up without feeling tired.        Expected Outcomes Lynore will continue to attend class regularly and continue to gain strength and stamina to aid in ADL's.           Discharge Exercise Prescription (Final Exercise Prescription Changes):     Exercise Prescription Changes - 03/28/17 1200      Response to Exercise   Blood Pressure (Admit) 132/75   Blood Pressure (Exercise) 142/66    Blood Pressure (Exit) 124/70   Heart Rate (Admit) 90 bpm   Heart Rate (Exercise) 128 bpm   Heart Rate (Exit) 61 bpm   Oxygen Saturation (Admit) 97 %   Oxygen Saturation (Exercise) 91 %   Oxygen Saturation (Exit) 97 %   Rating of Perceived Exertion (Exercise) 13   Perceived Dyspnea (Exercise) 1   Duration Progress to 45 minutes of aerobic exercise without signs/symptoms of physical distress   Intensity THRR unchanged     Progression   Progression Continue to progress workloads to maintain intensity without signs/symptoms of physical distress.     Resistance Training   Training Prescription Yes   Weight 2   Reps 10-15     Interval Training   Interval Training No     Biostep-RELP   Level 4   Minutes 15   METs 2      Nutrition:  Target Goals: Understanding of nutrition guidelines, daily intake of sodium <1556m, cholesterol <2055m calories 30% from fat and 7% or less from saturated fats, daily to have 5 or more servings of fruits and vegetables.  Biometrics:     Pre Biometrics - 12/19/16 1159      Pre Biometrics   Height _0  (1.651 m)   Weight 242 lb 12.8 oz (110.1 kg)   Waist Circumference 47 inches   Hip Circumference 51.5 inches   Waist to Hip Ratio 0.91 %   BMI (Calculated) 40.5  Post Biometrics - 03/28/17 1204       Post  Biometrics   Height '5\' 5"'$  (1.651 m)   Weight 246 lb 14.4 oz (112 kg)   Waist Circumference 46.5 inches   Hip Circumference 53.5 inches   Waist to Hip Ratio 0.87 %   BMI (Calculated) 41.2      Nutrition Therapy Plan and Nutrition Goals:   Nutrition Discharge: Rate Your Plate Scores:   Nutrition Goals Re-Evaluation:   Nutrition Goals Discharge (Final Nutrition Goals Re-Evaluation):   Psychosocial: Target Goals: Acknowledge presence or absence of significant depression and/or stress, maximize coping skills, provide positive support system. Participant is able to verbalize types and ability to use techniques and  skills needed for reducing stress and depression.   Initial Review & Psychosocial Screening:     Initial Psych Review & Screening - 12/19/16 1205      Family Dynamics   Good Support System? Yes   Comments Ms Cisnero has good support from her family. Her 2 sisters participate in the [employer] classes at the [facility] together. She likes being active by visiting sick friends and shopping. She is looking forward to [employer].     Barriers   Psychosocial barriers to participate in program There are no identifiable barriers or psychosocial needs.;The patient should benefit from training in stress management and relaxation.     Screening Interventions   Interventions Encouraged to exercise;Program counselor consult      Quality of Life Scores:     Quality of Life - 03/30/17 1136      Quality of Life Scores   Health/Function Pre 21 %   Health/Function Post 26.53 %   Health/Function % Change 26.33 %   Socioeconomic Pre 21 %   Socioeconomic Post 28.44 %   Socioeconomic % Change  35.43 %   Psych/Spiritual Pre 20.86 %   Psych/Spiritual Post 28.93 %   Psych/Spiritual % Change 38.69 %   Family Pre 20.4 %   Family Post 27.6 %   Family % Change 35.29 %   GLOBAL Pre 20.71 %   GLOBAL Post 27.57 %   GLOBAL % Change 33.12 %      PHQ-9: Recent Review Flowsheet Data    Depression screen [facility] 2/9 03/30/2017 02/13/2017 12/19/2016 12/19/2016 09/18/2016   Decreased Interest 0 0 2 0 0   Down, Depressed, Hopeless 0 0 0 0 0   PHQ - 2 Score 0 0 2 0 0   Altered sleeping 3 - 3 - -   Tired, decreased energy 1 - 3 - -   Change in appetite 0 - 0 - -   Feeling bad or failure about yourself  0 - 0 - -   Trouble concentrating 0 - 0 - -   Moving slowly or fidgety/restless 0 - 0 - -   Suicidal thoughts 0 - 0 - -   PHQ-9 Score 4 - 8 - -   Difficult doing work/chores Not difficult at all - Very difficult - -     Interpretation of Total Score  Total Score Depression Severity:  1-4 = Minimal depression, 5-9  = Mild depression, 10-14 = Moderate depression, 15-19 = Moderately severe depression, 20-27 = Severe depression   Psychosocial Evaluation and Intervention:     Psychosocial Evaluation - 01/01/17 1115      Psychosocial Evaluation & Interventions   Interventions Encouraged to exercise with the program and follow exercise prescription;Relaxation education;Stress management education   Comments Counselor met with Ms.  Cuadrado today for initial psychosocial evaluation.  She is a lovely 82 year old who has CHF and Diabetes and began struggling more with fatigue and shortness of breath.  Although she lives alone, she has a strong support system with a large family of sisters and (2) adult sons who live closeby.  She is also actively involved in her local church.  Ms. Buchner reports the main problem she has is her interrupted sleep after 3-4 hours each night.  She has a CPAP and is prescribed Xanax to be taken as needed to help, but this continues to be problematic.  She has a good appetite and she reports she is typically in a positive mood.  Ms. Fort denies a history of depression or current symptoms, but admits there has been some anxiety symptoms since she was diagnosed with CHF and is not resting well.  She states the medications help with these symptoms, but walking and moving is the best remedy she reports and is looking forward to doing that more consistently for her sleep as well as anxiety symptoms while in this program.  Ms. Haggar has goals to increase her strength, stamina and overall energy and get back to doing more normal household activities while in Pulmonary Rehab.  She would also like to sleep better and lose some weight.  Counselor encouraged her to see the dietician to discuss her nutrition and weight loss goals.  Counselor also encouraged Ms. Frankson to consistently exercise and move to improve her sleep quality and anxiety symptoms - which she reported had helped in the past.  Staff will follow  with Ms. Delorenzo throughout the course of this program.     Continue Psychosocial Services  Yes  Follow with Ms. Diiorio re: her sleep and anxiety symptoms -whether exercise has helped.      Psychosocial Re-Evaluation:     Psychosocial Re-Evaluation    Row Name 01/24/17 1123 02/07/17 1204 02/26/17 1232 03/12/17 1115 03/14/17 1044     Psychosocial Re-Evaluation   Comments Counselor follow up with Stanton Kidney today reporting she is enjoying this program and always looks forward to coming.  She states ongoing problems with sleep interruption after maybe 3-4 hours per night of sleep.  She reports she is awakened to her "heart pounding and feeling it particularly in her right ear."  Her Drs have looked at this from many perspectives and are considering an ENT later in March.  However, her cardiologist found a swollen thyroid in the meantime and she is scheduled for a biopsy on March 1st for this.  She also has fungus under one of her fingernails and is getting that looked at, all the while changing primary care doctors in the meantime.  However, Shaletta continues to be positive and upbeat and states exercise and her faith and strong support system keep her this way.  Counselor commended Ms. Domek for her consistently positive attitude with so much going on; as well as her commitment to consistency in exercise.  Counselor encouraged Paiton to practice deep breathing when she wakes up with her rapid heart beat and pounding in her ears.  She agreed to try.   Counselor follow up with [facility] today.  She states the biopsy on her thyroid was benign and she had blood work done yesterday to determine what is causing her negative symptoms (heart pounding and feeling it in her ear).  She is in transition with primary care providers and will see her new PCP later this month.  After  that she plans to get a referral for an ENT to get to the bottom of these symptoms.  She states she is sleeping better lately and is noticing more strength  and flexibility.  She is not experiencing dizziness when she bends over any more since coming into this program.  She enjoys the socialization as well and keeps a sweet smile on her face most of the time.  She manages her stress by exercise; prayer and relying on her family and friends.  Couinselor commended Chrisney for all the hard work and progress made while being in this program.   Counselor follow up with Stanton Kidney today reporting she is moving better and breathing better since coming into this program.  Her sleep has also improved and she is walking further without having to stop for breath.  Yania states she is bending over now without being dizzy so her flexibility has improved as well as her right shoulder problems since working out more consistently here.  Her mood is positive and she continues to manage her stress well.  Counselor commended Norman for her consistency in exercise and all the progress she has experienced as a result.   Counselor follow up with Stanton Kidney today reporting she is able to go shopping now with her 2 year old sister and stay gone all day - which is an improvement since coming into this program.  Counselor commended Laveah on her progress made and hard work towards her goals.  She continues to struggle with sleep but states her medication helps with this most of the time.  Staff will continue to follow with her.   Counselor follow with Stanton Kidney today reporting she has noticed her sleep is 5-6 hours per night lately vs. 2-3 when she first came into this program  Counselor commended her on her hard work and progress towards this goal and in this program.   Expected Outcomes  -- Charday will continue to exercise and experience improved sleep; mood; stamina and strength.  She will continue to pursue medical assistance with her health concerns.    --  --  --   Interventions Relaxation education  --  -- Relaxation education  --   [place] Name 03/20/17 0705 03/26/17 1104           Psychosocial Re-Evaluation    Comments  -- Stanton Kidney met with the counselor briefly today reporting her Bursitis had gotten so bad last week that she had to see the Dr. on Friday.  He prescribed steroids and recommended something for pain (which she refused and agreed to Tylenol instead).  She reports the steroids are impacting her ability to sleep but she thinks overall it will be worth it as long as it helps with the discomfort from the Bursitis.  Dura maintains such a positive attitude.  Counselor commended her for her positive self care and seeing a Dr. when needed.  She continues to exercise consistently and is avoiding any of the machine use of arms to avoid further inflammation -until the Bursitis has improved.  Counselor will continue to follow with [facility].        Expected Outcomes Ms Verastegui keeps a very positive attitude about her exercise, family life, and health. I commend her for her regular attendence and interest in education in [place] and her future goals to continue exercise at the [facility]. Virgen will continue to exercise consistently but will not use the arms in motion on specific machines until her Bursitis inflammation has improved.  She  will follow Drs orders re: medications and self care.        Continue Psychosocial Services   -- Follow up required by counselor         Psychosocial Discharge (Final Psychosocial Re-Evaluation):     Psychosocial Re-Evaluation - 03/26/17 1104      Psychosocial Re-Evaluation   Comments Stanton Kidney met with the counselor briefly today reporting her Bursitis had gotten so bad last week that she had to see the Dr. on Friday.  He prescribed steroids and recommended something for pain (which she refused and agreed to Tylenol instead).  She reports the steroids are impacting her ability to sleep but she thinks overall it will be worth it as long as it helps with the discomfort from the Bursitis.  Pernie maintains such a positive attitude.  Counselor commended her for her positive self care and seeing a  Dr. when needed.  She continues to exercise consistently and is avoiding any of the machine use of arms to avoid further inflammation -until the Bursitis has improved.  Counselor will continue to follow with [facility].     Expected Outcomes Xitlali will continue to exercise consistently but will not use the arms in motion on specific machines until her Bursitis inflammation has improved.  She will follow Drs orders re: medications and self care.     Continue Psychosocial Services  Follow up required by counselor      Education: Education Goals: Education classes will be provided on a weekly basis, covering required topics. Participant will state understanding/return demonstration of topics presented.  Learning Barriers/Preferences:     Learning Barriers/Preferences - 12/19/16 1157      Learning Barriers/Preferences   Learning Barriers None   Learning Preferences None      Education Topics: Initial Evaluation Education: - Verbal, written and demonstration of respiratory meds, RPE/PD scales, oximetry and breathing techniques. Instruction on use of nebulizers and MDIs: cleaning and proper use, rinsing mouth with steroid doses and importance of monitoring MDI activations.   Pulmonary Rehab from 04/04/2017 in [facility] Cardiac and Pulmonary Rehab  Date  12/19/16  Educator  LB  Instruction Review Code  2- meets goals/outcomes      General Nutrition Guidelines/Fats and Fiber: -Group instruction provided by verbal, written material, models and posters to present the general guidelines for heart healthy nutrition. Gives an explanation and review of dietary fats and fiber.   Pulmonary Rehab from 04/04/2017 in [facility] Cardiac and Pulmonary Rehab  Date  02/05/17  Educator  CR  Instruction Review Code  2- meets goals/outcomes      Controlling Sodium/Reading Food Labels: -Group verbal and written material supporting the discussion of sodium use in heart healthy nutrition. Review and explanation with models,  verbal and written materials for utilization of the food label.   Pulmonary Rehab from 04/04/2017 in [facility] Cardiac and Pulmonary Rehab  Date  02/19/17  Educator  CR  Instruction Review Code  2- meets goals/outcomes      Exercise Physiology & Risk Factors: - Group verbal and written instruction with models to review the exercise physiology of the cardiovascular system and associated critical values. Details cardiovascular disease risk factors and the goals associated with each risk factor.   Pulmonary Rehab from 04/04/2017 in [facility] Cardiac and Pulmonary Rehab  Date  03/09/17  Educator  A. Oletta Darter, EP  Instruction Review Code  2- meets goals/outcomes      Aerobic Exercise & Resistance Training: - Gives group verbal and written discussion on the  health impact of inactivity. On the components of aerobic and resistive training programs and the benefits of this training and how to safely progress through these programs.   Pulmonary Rehab from 04/04/2017 in [facility] Cardiac and Pulmonary Rehab  Date  01/03/17  Educator  [facility]  Instruction Review Code  2- meets goals/outcomes      Flexibility, Balance, General Exercise Guidelines: - Provides group verbal and written instruction on the benefits of flexibility and balance training programs. Provides general exercise guidelines with specific guidelines to those with heart or lung disease. Demonstration and skill practice provided.   Pulmonary Rehab from 04/04/2017 in [facility] Cardiac and Pulmonary Rehab  Date  01/26/17  Educator  Griselda Miner,   Instruction Review Code  2- meets goals/outcomes      Stress Management: - Provides group verbal and written instruction about the health risks of elevated stress, cause of high stress, and healthy ways to reduce stress.   Depression: - Provides group verbal and written instruction on the correlation between heart/lung disease and depressed mood, treatment options, and the stigmas associated with seeking  treatment.   Pulmonary Rehab from 04/04/2017 in [facility] Cardiac and Pulmonary Rehab  Date  02/07/17  Educator  [facility]  Instruction Review Code  2- meets goals/outcomes      Exercise & Equipment Safety: - Individual verbal instruction and demonstration of equipment use and safety with use of the equipment.   Pulmonary Rehab from 04/04/2017 in [facility] Cardiac and Pulmonary Rehab  Date  12/27/16  Educator  [facility]  Instruction Review Code  2- meets goals/outcomes      Infection Prevention: - Provides verbal and written material to individual with discussion of infection control including proper hand washing and proper equipment cleaning during exercise session.   Pulmonary Rehab from 04/04/2017 in [facility] Cardiac and Pulmonary Rehab  Date  12/19/16  Educator  LB  Instruction Review Code  2- meets goals/outcomes      Falls Prevention: - Provides verbal and written material to individual with discussion of falls prevention and safety.   Pulmonary Rehab from 04/04/2017 in [facility] Cardiac and Pulmonary Rehab  Date  12/19/16  Educator  LB  Instruction Review Code  2- meets goals/outcomes      Diabetes: - Individual verbal and written instruction to review signs/symptoms of diabetes, desired ranges of glucose level fasting, after meals and with exercise. Advice that pre and post exercise glucose checks will be done for 3 sessions at entry of program.   Pulmonary Rehab from 04/04/2017 in [facility] Cardiac and Pulmonary Rehab  Date  12/19/16  Educator  LB  Instruction Review Code  2- meets goals/outcomes      Chronic Lung Diseases: - Group verbal and written instruction to review new updates, new respiratory medications, new advancements in procedures and treatments. Provide informative websites and "800" numbers of self-education.   Pulmonary Rehab from 04/04/2017 in [facility] Cardiac and Pulmonary Rehab  Date  03/14/17  Educator  LB  Instruction Review Code  2- meets goals/outcomes      Lung Procedures: - Group  verbal and written instruction to describe testing methods done to diagnose lung disease. Review the outcome of test results. Describe the treatment choices: Pulmonary Function Tests, ABGs and oximetry.   Energy Conservation: - Provide group verbal and written instruction for methods to conserve energy, plan and organize activities. Instruct on pacing techniques, use of adaptive equipment and posture/positioning to relieve shortness of breath.   Triggers: - Group verbal and written instruction  to review types of environmental controls: home humidity, furnaces, filters, dust mite/pet prevention, HEPA vacuums. To discuss weather changes, air quality and the benefits of nasal washing.   Pulmonary Rehab from 04/04/2017 in [facility] Cardiac and Pulmonary Rehab  Date  03/28/17  Educator  LB  Instruction Review Code  2- meets goals/outcomes      Exacerbations: - Group verbal and written instruction to provide: warning signs, infection symptoms, calling MD promptly, preventive modes, and value of vaccinations. Review: effective airway clearance, coughing and/or vibration techniques. Create an Sports administrator.   Pulmonary Rehab from 04/04/2017 in [facility] Cardiac and Pulmonary Rehab  Date  03/21/17  Educator  LB  Instruction Review Code  2- meets goals/outcomes      Oxygen: - Individual and group verbal and written instruction on oxygen therapy. Includes supplement oxygen, available portable oxygen systems, continuous and intermittent flow rates, oxygen safety, concentrators, and Medicare reimbursement for oxygen.   Respiratory Medications: - Group verbal and written instruction to review medications for lung disease. Drug class, frequency, complications, importance of spacers, rinsing mouth after steroid MDI's, and proper cleaning methods for nebulizers.   AED/CPR: - Group verbal and written instruction with the use of models to demonstrate the basic use of the AED with the basic ABC's of resuscitation.    Pulmonary Rehab from 04/04/2017 in [facility] Cardiac and Pulmonary Rehab  Date  03/30/17  Educator  MA  Instruction Review Code  2- meets goals/outcomes      Breathing Retraining: - Provides individuals verbal and written instruction on purpose, frequency, and proper technique of diaphragmatic breathing and pursed-lipped breathing. Applies individual practice skills.   Pulmonary Rehab from 04/04/2017 in [facility] Cardiac and Pulmonary Rehab  Date  12/19/16  Educator  LB  Instruction Review Code  2- meets goals/outcomes      Anatomy and Physiology of the Lungs: - Group verbal and written instruction with the use of models to provide basic lung anatomy and physiology related to function, structure and complications of lung disease.   Pulmonary Rehab from 04/04/2017 in [facility] Cardiac and Pulmonary Rehab  Date  02/23/17  Educator  LB  Instruction Review Code  2- meets goals/outcomes      Heart Failure: - Group verbal and written instruction on the basics of heart failure: signs/symptoms, treatments, explanation of ejection fraction, enlarged heart and cardiomyopathy.   Pulmonary Rehab from 04/04/2017 in [facility] Cardiac and Pulmonary Rehab  Date  01/19/17  Educator  CE  Instruction Review Code  2- meets goals/outcomes      Sleep Apnea: - Individual verbal and written instruction to review Obstructive Sleep Apnea. Review of risk factors, methods for diagnosing and types of masks and machines for OSA.   Anxiety: - Provides group, verbal and written instruction on the correlation between heart/lung disease and anxiety, treatment options, and management of anxiety.   Relaxation: - Provides group, verbal and written instruction about the benefits of relaxation for patients with heart/lung disease. Also provides patients with examples of relaxation techniques.   Pulmonary Rehab from 04/04/2017 in [facility] Cardiac and Pulmonary Rehab  Date  04/04/17  Educator  [facility]  Instruction Review Code  2- Meets  goals/outcomes      Knowledge Questionnaire Score:     Knowledge Questionnaire Score - 03/28/17 1610      Knowledge Questionnaire Score   Post Score 10/10       Core Components/Risk Factors/Patient Goals at Admission:     Personal Goals and Risk Factors at Admission -  12/19/16 1201      Core Components/Risk Factors/Patient Goals on Admission    Weight Management Yes;Weight Loss   Intervention Weight Management: Develop a combined nutrition and exercise program designed to reach desired caloric intake, while maintaining appropriate intake of nutrient and fiber, sodium and fats, and appropriate energy expenditure required for the weight goal.;Weight Management: Provide education and appropriate resources to help participant work on and attain dietary goals.;Weight Management/Obesity: Establish reasonable short term and long term weight goals.;Obesity: Provide education and appropriate resources to help participant work on and attain dietary goals.   Admit Weight 242 lb 12.8 oz (110.1 kg)   Goal Weight: Short Term 237 lb (107.5 kg)   Goal Weight: Long Term 190 lb (86.2 kg)   Expected Outcomes Short Term: Continue to assess and modify interventions until short term weight is achieved;Long Term: Adherence to nutrition and physical activity/exercise program aimed toward attainment of established weight goal;Weight Maintenance: Understanding of the daily nutrition guidelines, which includes 25-35% calories from fat, 7% or less cal from saturated fats, less than 243m cholesterol, less than 1.5gm of sodium, & 5 or more servings of fruits and vegetables daily;Weight Loss: Understanding of general recommendations for a balanced deficit meal plan, which promotes 1-2 lb weight loss per week and includes a negative energy balance of [phone] kcal/d;Understanding recommendations for meals to include 15-35% energy as protein, 25-35% energy from fat, 35-60% energy from carbohydrates, less than 2012mof  dietary cholesterol, 20-35 gm of total fiber daily;Understanding of distribution of calorie intake throughout the day with the consumption of 4-5 meals/snacks   Sedentary Yes  Particip[facility] [employer] [facility] [employer], support and counseling about physical activity/exercise needs.;Develop an individualized exercise prescription for aerobic and resistive training based on initial evaluation findings, risk stratification, comorbidities and participant's personal goals.   Expected Outcomes Achievement of increased cardiorespiratory fitness and enhanced flexibility, muscular endurance and strength shown through measurements of functional capacity and personal statement of participant.   Increase Strength and Stamina Yes   Intervention Provide advice, education, support and counseling about physical activity/exercise needs.;Develop an individualized exercise prescription for aerobic and resistive training based on initial evaluation findings, risk stratification, comorbidities and participant's personal goals.   Expected Outcomes Achievement of increased cardiorespiratory fitness and enhanced flexibility, muscular endurance and strength shown through measurements of functional capacity and personal statement of participant.   Improve shortness of breath with ADL's Yes   Intervention Provide education, individualized exercise plan and daily activity instruction to help decrease symptoms of SOB with activities of daily living.   Expected Outcomes Short Term: Achieves a reduction of symptoms when performing activities of daily living.   Develop more efficient breathing techniques such as purse lipped breathing and diaphragmatic breathing; and practicing self-pacing with activity Yes   Intervention Provide education, demonstration and support about specific breathing techniuqes utilized for more efficient breathing. Include techniques such as pursed lipped breathing,  diaphragmatic breathing and self-pacing activity.   Expected Outcomes Short Term: Participant will be able to demonstrate and use breathing techniques as needed throughout daily activities.   Increase knowledge of respiratory medications and ability to use respiratory devices properly  Yes  CPAP at [employer] and demonstration as needed of appropriate use of medications, inhalers, and oxygen therapy.   Expected Outcomes Short Term: Achieves understanding of medications use. Understands that oxygen is a medication prescribed by physician. Demonstrates appropriate use of inhaler and oxygen therapy.  Diabetes Yes   Intervention Provide education about signs/symptoms and action to take for hypo/hyperglycemia.;Provide education about proper nutrition, including hydration, and aerobic/resistive exercise prescription along with prescribed medications to achieve blood glucose in normal ranges: Fasting glucose 65-99 mg/dL   Expected Outcomes Short Term: Participant verbalizes understanding of the signs/symptoms and immediate care of hyper/hypoglycemia, proper foot care and importance of medication, aerobic/resistive exercise and nutrition plan for blood glucose control.;Long Term: Attainment of HbA1C < 7%.   Heart Failure Yes   Intervention Provide a combined exercise and nutrition program that is supplemented with education, support and counseling about heart failure. Directed toward relieving symptoms such as shortness of breath, decreased exercise tolerance, and extremity edema.   Expected Outcomes Improve functional capacity of life;Short term: Attendance in program 2-3 days a week with increased exercise capacity. Reported lower sodium intake. Reported increased fruit and vegetable intake. Reports medication compliance.;Short term: Daily weights obtained and reported for increase. Utilizing diuretic protocols set by physician.;Long term: Adoption of self-care skills and  reduction of barriers for early signs and symptoms recognition and intervention leading to self-care maintenance.   Hypertension Yes   Intervention Provide education on lifestyle modifcations including regular physical activity/exercise, weight management, moderate sodium restriction and increased consumption of fresh fruit, vegetables, and low fat dairy, alcohol moderation, and smoking cessation.;Monitor prescription use compliance.   Expected Outcomes Short Term: Continued assessment and intervention until BP is < 140/71m HG in hypertensive participants. < 130/847mHG in hypertensive participants with diabetes, heart failure or chronic kidney disease.;Long Term: Maintenance of blood pressure at goal levels.   Lipids Yes   Intervention Provide education and support for participant on nutrition & aerobic/resistive exercise along with prescribed medications to achieve LDL <7049mHDL >71m72m Expected Outcomes Short Term: Participant states understanding of desired cholesterol values and is compliant with medications prescribed. Participant is following exercise prescription and nutrition guidelines.;Long Term: Cholesterol controlled with medications as prescribed, with individualized exercise RX and with personalized nutrition plan. Value goals: LDL < 70mg42mL > 40 mg.      Core Components/Risk Factors/Patient Goals Review:      Goals and Risk Factor Review    Row Name 01/01/17 1058 01/22/17 1141 02/26/17 1256 03/20/17 0656       Core Components/Risk Factors/Patient Goals Review   Personal Goals Review Weight Management/Obesity;Increase Strength and Stamina;Hypertension;Lipids;Diabetes;Improve shortness of breath with ADL's Sedentary;Increase Strength and Stamina;Other Improve shortness of breath with ADL's;Weight Management/Obesity;Stress;Diabetes;Lipids;Hypertension Weight Management/Obesity;Heart Failure;Improve shortness of breath with ADL's;Hypertension;Diabetes;Develop more efficient  breathing techniques such as purse lipped breathing and diaphragmatic breathing and practicing self-pacing with activity.;Lipids    Review The following goals were discussed with the patient. Zaydah Myanaed that her last A1C was 6.8 and all the rest of her blood work has been in acceptable ranges. She did state that her SOB sometimes limits her activities or slows her down. She is hoping that his program will help her with that. Before starting [facility] she was going to a [employer] Staff[employer] 3 days a week at the [employer] stated that she does eventually want to work towards using the machines at the [employer]. Weight loss is a major gaol and exercise guidelines for weight loss were disucssed with the patient.  Annalis doesnt want to reschedule her RD appointment.  She "loves exercise"  and reports she is doing much better with exercise.  She can get throught her ADLs without as much fatigue and uses a fan to cool off -  her Dr feels her getting hot is from a fib.  She reports not sleeping well due to using a CPAP machine. Zailyn stated that she is on a temporary medication that is causing her to gain weight but is almost done this med. She stated that she does not have any SOB anymore with daily activities and she is doing well emotionally. She reported that her recent blood work showed an A1C of 6.7 and lipids and blood pressure are under control.  Ms Legore attends regularly and has acceptable blood pressures with no diabetic problems and is compliant with all her medications, including her lipid madication. She drinks 64oz of water and self-manages her CHF with her insurance company  -  using their scale and log book for weight and notifying them if she is 3lbs over her normal weight. If so, Ms Vanleeuwen takes an extra fluid pill. Ms Hebert plans to return to the [facility] and continue her dance clas, plus now add her [facility] exercise goals. She also walks the [facility] trail. She has good PLB technique whixch helps her  shortness of breath.    Expected Outcomes Courtany will consistantly come to class and participate in her exercise program as well as education classes. Using what she learns and being consistant with her attendance will help her see progress in the areas mentioned above.   -- Kairie will continue to attend exercise and education classes regularly to aid in accomplishing the above mentioned goals.  Continue increasing exercise goals for increased activity level.       Core Components/Risk Factors/Patient Goals at Discharge (Final Review):      Goals and Risk Factor Review - 03/20/17 0656      Core Components/Risk Factors/Patient Goals Review   Personal Goals Review Weight Management/Obesity;Heart Failure;Improve shortness of breath with ADL's;Hypertension;Diabetes;Develop more efficient breathing techniques such as purse lipped breathing and diaphragmatic breathing and practicing self-pacing with activity.;Lipids   Review Ms Seltzer attends regularly and has acceptable blood pressures with no diabetic problems and is compliant with all her medications, including her lipid madication. She drinks 64oz of water and self-manages her CHF with her insurance company  -  using their scale and log book for weight and notifying them if she is 3lbs over her normal weight. If so, Ms Weyrauch takes an extra fluid pill. Ms Poulter plans to return to the [facility] and continue her dance clas, plus now add her [facility] exercise goals. She also walks the [facility] trail. She has good PLB technique whixch helps her shortness of breath.   Expected Outcomes Continue increasing exercise goals for increased activity level.      ITP Comments:     ITP Comments    Row Name 02/15/17 1209 04/09/17 0757         ITP Comments Diahn sees her kidney Dr tomorrow and will bring in clearance to exercise when she returns to class Monday.  30 day note review by Dr Emily Filbert, Medical Director of [facility]         Comments:  30 day note  review by Dr Emily Filbert, Medical Director of [place]

## 2017-04-09 NOTE — Progress Notes (Signed)
Daily Session Note  Patient Details  Name: [name] [name] MRN: [id number] Date of Birth: 12/18/1934 Referring Provider:     Pulmonary Rehab from 12/19/2016 in [facility] Cardiac and Pulmonary Rehab  Referring Provider  [place]      Encounter Date: 04/09/2017  Check In:     Session Check In - 04/09/17 1249      Check-In   Location [facility]-Cardiac & Pulmonary Rehab   Staff Present Nada Maclachlan, BA, ACSM CEP, Exercise Physiologist;Laureen Owens Shark, BS, RRT, Respiratory Bertis Ruddy, BS, ACSM CEP, Exercise Physiologist   Supervising physician immediately available to respond to emergencies [facility] immediately available ER MD   Physician(s) Burlene Arnt and Jimmye Norman    Medication changes reported     No   Fall or balance concerns reported    No   Warm-up and Cool-down Performed as group-led instruction   Resistance Training Performed Yes   VAD Patient? No     Pain Assessment   Currently in Pain? No/denies   Multiple Pain Sites No         History  Smoking Status  . Former Smoker  . Packs/day: 0.50  . Years: 34.00  . Types: Cigarettes  . Quit date: 08/15/1989  Smokeless Tobacco  . Never Used    Goals Met:  Proper associated with RPD/PD & O2 Sat Independence with exercise equipment Exercise tolerated well Strength training completed today  Goals Unmet:  Not Applicable  Comments: Pt able to follow exercise prescription today without complaint.  Will continue to monitor for progression.    Dr. Emily Filbert is Medical Director for [place] and [facility].

## 2017-04-10 ENCOUNTER — Telehealth: Payer: Self-pay | Admitting: Cardiovascular Disease

## 2017-04-10 NOTE — Telephone Encounter (Signed)
Pt states that Dr. Netty Starring checks her INR, but Dr. Rockey Situ asked her to call w/ her reading.  She is just calling to let him know what it is since she's in afib & she needs to be in range for 4 weeks in order to get a DCCV. She reports that she is still in afib, she's SOB and her legs are weak.  Her flecainide was increased to 100 mg BID at ov 04/02/17 - advised her to continue this and give it time to work. She will call back if she would like an EKG before her next appt.  She is appreciative of the call and will let us know if we can be of further assistance.

## 2017-04-10 NOTE — Telephone Encounter (Signed)
Pt calling she was told to call us and let us know about her coumadin levels She went yesterday, she did this at her PCP office. The nurse called her this morning. 04/09/17 INR was  2.5 Her next appointment is on 05/14/17  Please call if we have any questions

## 2017-04-11 DIAGNOSIS — I5032 Chronic diastolic (congestive) heart failure: Secondary | ICD-10-CM

## 2017-04-11 NOTE — Progress Notes (Signed)
Daily Session Note  Patient Details  Name: [name] [name] MRN: [id number] Date of Birth: 29-May-1935 Referring Provider:     Pulmonary Rehab from 12/19/2016 in [facility] Cardiac and Pulmonary Rehab  Referring Provider  Gollan      Encounter Date: 04/11/2017  Check In:     Session Check In - 04/11/17 1214      Check-In   Location [facility]-Cardiac & Pulmonary Rehab   Staff Present Carson Myrtle, BS, RRT, Respiratory Lennie Hummer, MA, ACSM RCEP, Exercise Physiologist;Collier Monica Oletta Darter, BA, ACSM CEP, Exercise Physiologist   Supervising physician immediately available to respond to emergencies [facility] immediately available ER MD   Physician(s) Alfred Levins and Jimmye Norman   Medication changes reported     No   Fall or balance concerns reported    No   Warm-up and Cool-down Performed as group-led instruction   Resistance Training Performed Yes   VAD Patient? No     Pain Assessment   Currently in Pain? No/denies         History  Smoking Status  . Former Smoker  . Packs/day: 0.50  . Years: 34.00  . Types: Cigarettes  . Quit date: 08/15/1989  Smokeless Tobacco  . Never Used    Goals Met:  Proper associated with RPD/PD & O2 Sat Independence with exercise equipment Exercise tolerated well Strength training completed today  Goals Unmet:  Not Applicable  Comments:  [name] [name] graduated today from rehab with 36 sessions completed.  Details of the patient's exercise prescription and what she needs to do in order to continue the prescription and progress were discussed with patient.  Patient was given a copy of prescription and goals.  Patient verbalized understanding.  [name] [name] [facility]    Dr. Emily Filbert is Medical Director for [place] and [facility].

## 2017-04-11 NOTE — Progress Notes (Signed)
Pulmonary Individual Treatment Plan  Patient Details  Name: [name] [name] MRN: [id number] Date of Birth: Jul 13, 1935 Referring Provider:     Pulmonary Rehab from 12/19/2016 in [facility] Re[facility]  Referring Provider  Gollan      Initial Encounter Date:    Pulmonary Rehab from 12/19/2016 in [facility] Cardiac and Pulmonary Rehab  Date  12/19/16  Referring Provider  Rockey Situ      Visit Diagnosis: Chronic diastolic congestive heart failure ([place])  Patient's Home Medications on Admission:  Current Outpatient Prescriptions:    acetaminophen (TYLENOL) 500 MG tablet, Take 500 mg by mouth every 6 (six) hours as needed., Disp: , Rfl:    ALPRAZolam (XANAX) 0.5 MG tablet, Take 1 tablet (0.5 mg total) by mouth daily as needed., Disp: 90 tablet, Rfl: 0   amLODipine (NORVASC) 10 MG tablet, TAKE 1 TABLET (10 MG TOTAL) BY MOUTH DAILY., Disp: 90 tablet, Rfl: 0   atorvastatin (LIPITOR) 20 MG tablet, TAKE 1 TABLET (20 MG TOTAL) BY MOUTH DAILY., Disp: 90 tablet, Rfl: 0   Brinzolamide-Brimonidine (SIMBRINZA) 1-0.2 % SUSP, Place 1 drop into both eyes 2 (two) times daily., Disp: 1 Bottle, Rfl: 1   carvedilol (COREG) 12.5 MG tablet, Take 1 tablet in the morning with breakfast and 2 tablets pm daily by mouth in the evening before bed., Disp: 90 tablet, Rfl: 3   esomeprazole (NEXIUM) 40 MG capsule, TAKE 1 CAPSULE (40 MG TOTAL) BY MOUTH DAILY., Disp: 90 capsule, Rfl: 1   ferrous sulfate 325 (65 FE) MG EC tablet, Take 325 mg by mouth daily., Disp: , Rfl:    flecainide (TAMBOCOR) 100 MG tablet, Take 1 tablet (100 mg total) by mouth 2 (two) times daily., Disp: 60 tablet, Rfl: 6   furosemide (LASIX) 20 MG tablet, Take 1 tablet (20 mg total) by mouth daily., Disp: 90 tablet, Rfl: 0   gentamicin (GARAMYCIN) 0.3 % ophthalmic solution, , Disp: , Rfl:    glipiZIDE (GLUCOTROL) 10 MG tablet, TAKE 1 TABLET (10 MG TOTAL) BY MOUTH 2 (TWO) TIMES DAILY BEFORE A MEAL., Disp: 180 tablet, Rfl: 0   metFORMIN  (GLUCOPHAGE-XR) 500 MG 24 hr tablet, TAKE 1 TABLET (500 MG TOTAL) BY MOUTH 2 (TWO) TIMES DAILY., Disp: 180 tablet, Rfl: 0   mometasone (ELOCON) 0.1 % cream, Apply topically daily. Apply to affected area as directed two times a day, Disp: 45 g, Rfl: 0   Multiple Vitamins-Minerals (CENTRUM SILVER PO), Take by mouth daily., Disp: , Rfl:    olmesartan (BENICAR) 40 MG tablet, TAKE 1 TABLET (40 MG TOTAL) BY MOUTH DAILY., Disp: 90 tablet, Rfl: 0   TRADJENTA 5 MG TABS tablet, TAKE 1 TABLET (5 MG TOTAL) BY MOUTH DAILY., Disp: 30 tablet, Rfl: 2   travoprost, benzalkonium, (TRAVATAN) 0.004 % ophthalmic solution, Place 1 drop into both eyes at bedtime. , Disp: , Rfl:    warfarin (COUMADIN) 3 MG tablet, TAKE 1-1.5 TABLETS DAILY AT 6 PM. TAKE 1 TAB ON TUES, THURS, SAT, SUN. TAKE 4.5 MG ON MON, WED, FRI, Disp: 100 tablet, Rfl: 3  Past Medical History: Past Medical History:  Diagnosis Date   1st degree AV block    a. PR 400 msec with Apacing   Anemia    Anxiety    Arthritis    a. shoulders.   Chest pain    a. 2004 Cath:  reportedly nl;  b. 09/2013 Neg MV.   Chronic diastolic heart failure ([place])    a. 09/2013 Echo: EF 50-55%, mild  LVH, mild to mod MR/TR.   Esophageal reflux    H/O hiatal hernia    History of UTI    Hyperlipidemia    Morbid obesity ([place])    OSA on CPAP    PAF (paroxysmal atrial fibrillation) (HCC)    SSS (sick sinus syndrome) ([place])    a. 2008 s/p MDT PPM;  b. 11/2013 Gen change- MDT ADDRL1 Adapta DC PPM, ser # [id number].   Symptomatic PVCs    Syncope and collapse    a. Felt to be vasovagal.   Type II diabetes mellitus (HCC)    Unspecified essential hypertension    Unspecified glaucoma(365.9)     Tobacco Use: History  Smoking Status   Former Smoker   Packs/day: 0.50   Years: 34.00   Types: Cigarettes   Quit date: 08/15/1989  Smokeless Tobacco   Never Used    Labs: Recent Review Flowsheet Data    Labs for ITP Cardiac and Pulmonary Rehab  Latest Ref Rng & Units 12/16/2015 03/15/2016 07/28/2016 11/01/2016 02/02/2017   Cholestrol <200 mg/dL 153 144 - 156 -   LDLCALC <100 mg/dL 72 67 - 79 -   HDL >50 mg/dL 61 49 - 58 -   Trlycerides <150 mg/dL 100 138 - 93 -   Hemoglobin A1c - 7.8 8.2 6.8 6.8 6.7       ADL UCSD:     Pulmonary Assessment Scores    Row Name 12/19/16 1158 03/02/17 1058 03/30/17 1136     ADL UCSD   ADL Phase Entry Mid Exit   SOB Score total _0 Rest 1 0 1   Walk 0 1 0   Stairs _1 Bath 0 1 0   Dress 0 0 0   Shop 0 0 1      Pulmonary Function Assessment:     Pulmonary Function Assessment - 12/19/16 1157      Initial Spirometry Results   FVC% 85 %   FEV1% 101 %   FEV1/FVC Ratio 91.51     Breath   Bilateral Breath Sounds Clear   Shortness of Breath Yes      Exercise Target Goals:    Exercise Program Goal: Individual exercise prescription set with THRR, safety & activity barriers. Participant demonstrates ability to understand and report RPE using BORG scale, to self-measure pulse accurately, and to acknowledge the importance of the exercise prescription.  Exercise Prescription Goal: Starting with aerobic activity 30 plus minutes a day, 3 days per week for initial exercise prescription. Provide home exercise prescription and guidelines that participant acknowledges understanding prior to discharge.  Activity Barriers & Risk Stratification:     Activity Barriers & Cardiac Risk Stratification - 12/19/16 1157      Activity Barriers & Cardiac Risk Stratification   Activity Barriers Arthritis;Back Problems;Joint Problems;Shortness of Breath;Deconditioning;Muscular Weakness      6 Minute Walk:     6 Minute Walk    Row Name 12/19/16 1201 02/26/17 1250 03/28/17 1205     6 Minute Walk   Phase  -- Mid Program Discharge   Distance 965 feet 985 feet 1210 feet   Distance % Change  --  -- 25 %   Walk Time 6 minutes 6 minutes 6 minutes   # of Rest Breaks 0 0 0   MPH 1.82 1.87  2.29   METS 2.38 2.45 2.8   RPE _2 Perceived Dyspnea  _3 VO2  Peak 5.43 8.5 9.6   Symptoms No Yes (comment)  back tightness No   Resting HR 68 bpm 83 bpm 90 bpm   Resting BP 122/66 120/76 132/74   Max Ex. HR 113 bpm 107 bpm 128 bpm   Max Ex. BP 172/72 152/80 142/66     Interval HR   Baseline HR 68 83 90   1 Minute HR 110 105 100   2 Minute HR 104 107 104   3 Minute HR 102 104  --   4 Minute HR  -- 103 128   5 Minute HR 98 102 106   6 Minute HR 113 102 99   2 Minute Post HR  -- 91  --   Interval Heart Rate? Yes Yes  --     Interval Oxygen   Interval Oxygen? Yes Yes  --   Baseline Oxygen Saturation % 97 % 97 % 97 %   Baseline Liters of Oxygen  -- 0 L  --   1 Minute Oxygen Saturation % 96 % 96 % 96 %   1 Minute Liters of Oxygen  -- 0 L  --   2 Minute Oxygen Saturation % 94 % 97 % 94 %   2 Minute Liters of Oxygen  -- 0 L  --   3 Minute Oxygen Saturation % 95 % 97 %  --   3 Minute Liters of Oxygen  -- 0 L  --   4 Minute Oxygen Saturation %  -- 95 % 95 %   4 Minute Liters of Oxygen  -- 0 L  --   5 Minute Oxygen Saturation % 96 % 95 % 95 %   5 Minute Liters of Oxygen  -- 0 L  --   6 Minute Oxygen Saturation % 95 % 95 % 96 %   6 Minute Liters of Oxygen  -- 0 L  --   2 Minute Post Oxygen Saturation %  -- 98 %  --   2 Minute Post Liters of Oxygen  -- 0 L  --     Oxygen Initial Assessment:     Oxygen Initial Assessment - 03/20/17 0656      Home Oxygen   Home Oxygen Device None      Oxygen Re-Evaluation:     Oxygen Re-Evaluation    Row Name 03/20/17 0717             Program Oxygen Prescription   Program Oxygen Prescription None         Home Oxygen   Home Oxygen Device None          Oxygen Discharge (Final Oxygen Re-Evaluation):     Oxygen Re-Evaluation - 03/20/17 0717      Program Oxygen Prescription   Program Oxygen Prescription None     Home Oxygen   Home Oxygen Device None      Initial Exercise Prescription:     Initial  Exercise Prescription - 12/19/16 1200      Date of Initial Exercise RX and Referring Provider   Date 12/19/16   Referring Provider Gollan     Treadmill   MPH 1.5   Grade 0.5   Minutes 15   METs 2.25     Recumbant Bike   Level 1   RPM 60   Minutes 15   METs 2     NuStep   Level 3   Minutes 15   METs 2.25     Biostep-RELP   Level  2   Minutes 15   METs 2     Prescription Details   Frequency (times per week) 3   Duration Progress to 45 minutes of aerobic exercise without signs/symptoms of physical distress     Intensity   THRR 40-80% of Max Heartrate 97-126   Ratings of Perceived Exertion 11-13   Perceived Dyspnea 0-4     Progression   Progression Continue to progress workloads to maintain intensity without signs/symptoms of physical distress.     Resistance Training   Training Prescription Yes   Weight 3   Reps 10-15      Perform Capillary Blood Glucose checks as needed.  Exercise Prescription Changes:     Exercise Prescription Changes    Row Name 12/27/16 1200 01/05/17 0900 01/10/17 1100 01/18/17 1100 02/02/17 1200     Response to Exercise   Blood Pressure (Admit) 112/54 134/70  -- 130/66 134/60   Blood Pressure (Exercise) 124/66 148/82  -- 128/70 126/84   Blood Pressure (Exit) 108/50 120/74  -- 108/58 124/64   Heart Rate (Admit) 79 bpm 79 bpm  -- 67 bpm 80 bpm   Heart Rate (Exercise) 77 bpm 68 bpm  -- 100 bpm 76 bpm   Heart Rate (Exit) 71 bpm 60 bpm  -- 69 bpm 67 bpm   Oxygen Saturation (Admit) 98 % 98 %  -- 97 % 97 %   Oxygen Saturation (Exercise) 98 % 98 %  -- 96 % 97 %   Oxygen Saturation (Exit) 100 % 99 %  -- 99 % 99 %   Rating of Perceived Exertion (Exercise) 13 13  -- 11 13   Perceived Dyspnea (Exercise) 2 3  -- 2 2   Comments  --  -- Home Exercise Guidelines given 01/10/17  --  --   Duration Progress to 45 minutes of aerobic exercise without signs/symptoms of physical distress Progress to 45 minutes of aerobic exercise without signs/symptoms of  physical distress Progress to 45 minutes of aerobic exercise without signs/symptoms of physical distress Progress to 45 minutes of aerobic exercise without signs/symptoms of physical distress Progress to 45 minutes of aerobic exercise without signs/symptoms of physical distress   Intensity _0      Progression   Progression Continue to progress workloads to maintain intensity without signs/symptoms of physical distress. Continue to progress workloads to maintain intensity without signs/symptoms of physical distress. Continue to progress workloads to maintain intensity without signs/symptoms of physical distress. Continue to progress workloads to maintain intensity without signs/symptoms of physical distress. Continue to progress workloads to maintain intensity without signs/symptoms of physical distress.     Resistance Training   Training Prescription _1    Weight _2 Reps 10-15 10-15 10-15 10-15  --     Treadmill   MPH 1  --  -- 1.7  --   Grade 0  --  -- 0  --   Minutes 15  --  -- 15  --   METs 1.77  --  -- 2.3  --     NuStep   Level _3 Minutes _4 METs  --  --  -- 2.3 2.3     Biostep-RELP   Level  -- _5 Minutes  -- _6 METs  -- 2 2  -- 2  Home Exercise Plan   Plans to continue exercise at  --  -- [employer] (comment)  [employer] at [employer] and walking  --  --   Frequency  --  -- Add 2 additional days to program exercise sessions.  --  --     Exercise Review   Progression  --  --  -- Yes  --   Row Name 02/15/17 1300 02/28/17 1200 03/14/17 1200 03/28/17 1200       Response to Exercise   Blood Pressure (Admit) 126/70 126/70 124/70 132/75    Blood Pressure (Exercise) 158/80 130/60 130/84 142/66    Blood Pressure (Exit) 136/70 134/66 118/56 124/70    Heart Rate (Admit) 63 bpm 76 bpm 69 bpm 90 bpm    Heart Rate (Exercise) 85 bpm 95 bpm  88 bpm 128 bpm    Heart Rate (Exit) 70 bpm 71 bpm 62 bpm 61 bpm    Oxygen Saturation (Admit) 96 % 96 % 96 % 97 %    Oxygen Saturation (Exercise) 98 % 95 % 96 % 91 %    Oxygen Saturation (Exit) 97 % 97 % 98 % 97 %    Rating of Perceived Exertion (Exercise) _0 Perceived Dyspnea (Exercise) _1 Duration Progress to 45 minutes of aerobic exercise without signs/symptoms of physical distress Progress to 45 minutes of aerobic exercise without signs/symptoms of physical distress Progress to 45 minutes of aerobic exercise without signs/symptoms of physical distress Progress to 45 minutes of aerobic exercise without signs/symptoms of physical distress    Intensity THRR unchanged THRR unchanged THRR unchanged THRR unchanged      Progression   Progression Continue to progress workloads to maintain intensity without signs/symptoms of physical distress. Continue to progress workloads to maintain intensity without signs/symptoms of physical distress. Continue to progress workloads to maintain intensity without signs/symptoms of physical distress. Continue to progress workloads to maintain intensity without signs/symptoms of physical distress.      Resistance Training   Training Prescription Yes Yes Yes Yes    Weight _2 Reps  -- 10-15 10-15 10-15      Interval Training   Interval Training No No  -- No      Treadmill   MPH  -- 1.4 1.8  --    Grade  -- 0 0  --    Minutes  -- 15 15  --    METs  -- 2.07 2.38  --      NuStep   Level 4 3  --  --    Minutes 15 15  --  --    METs 2.6 2  --  --      Biostep-RELP   Level  -- _3 Minutes  -- _4 METs  -- _5 Exercise Comments:     Exercise Comments    Row Name 12/29/16 1115 01/05/17 0946 01/10/17 1157 01/18/17 1146 02/02/17 1226   Exercise Comments Ms. Kakos was unable to follow exercise prescription today, due to pt became dizzy on the treadmill.  She was advised to sit in chair, where we obtained BP  and blood glucose.  BP 140/60 and BG 111.  Pt sat for 5 minutes and stated she felt better and continued on to next machine, T4.  Pt did well with no other  complaints today.  Will continue to monitor for progression. Dalyah has tolerated exrecise very well in her first week of exercise. Reviewed home exercise with pt today.  Pt plans to use Silver [employer] at [facility] and walk at home for exercise.  Reviewed THR, pulse, RPE, sign and symptoms, NTG use, and when to call 911 or MD.  Also discussed weather considerations and indoor options.  Pt voiced understanding. Dalma has made significant improvement walking on TM.  Staff will continue to monitor progression. Sienna was absent last week but continues to tolerate exercise well.   [place] Name 02/15/17 1310 02/28/17 1227 03/14/17 1306 03/28/17 1210     Exercise Comments Goldie has been out due to low magnesium and dehydration.  She plans to return Monday 3/19. Olamide continues to tolerate exercise well. Charlita is back to her previous exercise levels since being absent.  Lysandra improved her post 5 min walk by 25%!       Exercise Goals and Review:   Exercise Goals Re-Evaluation :     Exercise Goals Re-Evaluation    Row Name 02/26/17 1254             Exercise Goal Re-Evaluation   Exercise Goals Review Increase Strenth and Stamina;Increase Physical Activity       Comments Tykerria stated she was feeling better and was able to do more at home. She reported that she can now bend over to pick things up without getting dizzy and was suprised that she was able to cook breakfast and clean it all up without feeling tired.        Expected Outcomes Lynore will continue to attend class regularly and continue to gain strength and stamina to aid in ADL's.           Discharge Exercise Prescription (Final Exercise Prescription Changes):     Exercise Prescription Changes - 03/28/17 1200      Response to Exercise   Blood Pressure (Admit) 132/75   Blood Pressure (Exercise) 142/66    Blood Pressure (Exit) 124/70   Heart Rate (Admit) 90 bpm   Heart Rate (Exercise) 128 bpm   Heart Rate (Exit) 61 bpm   Oxygen Saturation (Admit) 97 %   Oxygen Saturation (Exercise) 91 %   Oxygen Saturation (Exit) 97 %   Rating of Perceived Exertion (Exercise) 13   Perceived Dyspnea (Exercise) 1   Duration Progress to 45 minutes of aerobic exercise without signs/symptoms of physical distress   Intensity THRR unchanged     Progression   Progression Continue to progress workloads to maintain intensity without signs/symptoms of physical distress.     Resistance Training   Training Prescription Yes   Weight 2   Reps 10-15     Interval Training   Interval Training No     Biostep-RELP   Level 4   Minutes 15   METs 2      Nutrition:  Target Goals: Understanding of nutrition guidelines, daily intake of sodium <1556m, cholesterol <2055m calories 30% from fat and 7% or less from saturated fats, daily to have 5 or more servings of fruits and vegetables.  Biometrics:     Pre Biometrics - 12/19/16 1159      Pre Biometrics   Height _0  (1.651 m)   Weight 242 lb 12.8 oz (110.1 kg)   Waist Circumference 47 inches   Hip Circumference 51.5 inches   Waist to Hip Ratio 0.91 %   BMI (Calculated) 40.5  Post Biometrics - 03/28/17 1204       Post  Biometrics   Height _0  (1.651 m)   Weight 246 lb 14.4 oz (112 kg)   Waist Circumference 46.5 inches   Hip Circumference 53.5 inches   Waist to Hip Ratio 0.87 %   BMI (Calculated) 41.2      Nutrition Therapy Plan and Nutrition Goals:   Nutrition Discharge: Rate Your Plate Scores:   Nutrition Goals Re-Evaluation:   Nutrition Goals Discharge (Final Nutrition Goals Re-Evaluation):   Psychosocial: Target Goals: Acknowledge presence or absence of significant depression and/or stress, maximize coping skills, provide positive support system. Participant is able to verbalize types and ability to use techniques and  skills needed for reducing stress and depression.   Initial Review & Psychosocial Screening:     Initial Psych Review & Screening - 12/19/16 1205      Family Dynamics   Good Support System? Yes   Comments Ms Zabawa has good support from her family. Her 2 sisters participate in the [place] classes at the [facility] together. She likes being active by visiting sick friends and shopping. She is looking forward to [employer].     Barriers   Psychosocial barriers to participate in program There are no identifiable barriers or psychosocial needs.;The patient should benefit from training in stress management and relaxation.     Screening Interventions   Interventions Encouraged to exercise;Program counselor consult      Quality of Life Scores:     Quality of Life - 03/30/17 1136      Quality of Life Scores   Health/Function Pre 21 %   Health/Function Post 26.53 %   Health/Function % Change 26.33 %   Socioeconomic Pre 21 %   Socioeconomic Post 28.44 %   Socioeconomic % Change  35.43 %   Psych/Spiritual Pre 20.86 %   Psych/Spiritual Post 28.93 %   Psych/Spiritual % Change 38.69 %   Family Pre 20.4 %   Family Post 27.6 %   Family % Change 35.29 %   GLOBAL Pre 20.71 %   GLOBAL Post 27.57 %   GLOBAL % Change 33.12 %      PHQ-9: Recent Review Flowsheet Data    Depression screen [facility] 2/9 03/30/2017 02/13/2017 12/19/2016 12/19/2016 09/18/2016   Decreased Interest 0 0 2 0 0   Down, Depressed, Hopeless 0 0 0 0 0   PHQ - 2 Score 0 0 2 0 0   Altered sleeping 3 - 3 - -   Tired, decreased energy 1 - 3 - -   Change in appetite 0 - 0 - -   Feeling bad or failure about yourself  0 - 0 - -   Trouble concentrating 0 - 0 - -   Moving slowly or fidgety/restless 0 - 0 - -   Suicidal thoughts 0 - 0 - -   PHQ-9 Score 4 - 8 - -   Difficult doing work/chores Not difficult at all - Very difficult - -     Interpretation of Total Score  Total Score Depression Severity:  1-4 = Minimal depression, 5-9  = Mild depression, 10-14 = Moderate depression, 15-19 = Moderately severe depression, 20-27 = Severe depression   Psychosocial Evaluation and Intervention:     Psychosocial Evaluation - 04/09/17 1102      Discharge Psychosocial Assessment & Intervention   Comments Counselor met with Ms. Komar today for a discharge summary today.  She reports having benefitted a great deal  from this program.  She is sleeping better and longer than before.  She also reports her flexibility has improved since she began working out more consistently.  Ms. Burgin maintains a positive attitude and is so pleasant to be around.  She is scheduled to discharge this Wednesday.  Unfortunately she has continued having problems with her right shoulder and will begin PT tomorrow at [facility] clinic to address this.  [facility].   Leisha reports having some AFib symptoms last week and she will be followed more closely by her Dr. as a result.  This has resulted in increased tiredness that needs to be addressed.  Amica has been a joy to serve in this program.  She will be missed.        Psychosocial Re-Evaluation:     Psychosocial Re-Evaluation    Row Name 01/24/17 1123 02/07/17 1204 02/26/17 1232 03/12/17 1115 03/14/17 1044     Psychosocial Re-Evaluation   Comments Counselor follow up with Stanton Kidney today reporting she is enjoying this program and always looks forward to coming.  She states ongoing problems with sleep interruption after maybe 3-4 hours per night of sleep.  She reports she is awakened to her "heart pounding and feeling it particularly in her right ear."  Her Drs have looked at this from many perspectives and are considering an ENT later in March.  However, her cardiologist found a swollen thyroid in the meantime and she is scheduled for a biopsy on March 1st for this.  She also has fungus under one of her fingernails and is getting that looked at,  all the while changing primary care doctors in the meantime.  However, Leianne continues to be positive and upbeat and states exercise and her faith and strong support system keep her this way.  Counselor commended Ms. Taulbee for her consistently positive attitude with so much going on; as well as her commitment to consistency in exercise.  Counselor encouraged Deshawnda to practice deep breathing when she wakes up with her rapid heart beat and pounding in her ears.  She agreed to try.   Counselor follow up with [facility] today.  She states the biopsy on her thyroid was benign and she had blood work done yesterday to determine what is causing her negative symptoms (heart pounding and feeling it in her ear).  She is in transition with primary care providers and will see her new PCP later this month.  After that she plans to get a referral for an ENT to get to the bottom of these symptoms.  She states she is sleeping better lately and is noticing more strength and flexibility.  She is not experiencing dizziness when she bends over any more since coming into this program.  She enjoys the socialization as well and keeps a sweet smile on her face most of the time.  She manages her stress by exercise; prayer and relying on her family and friends.  Couinselor commended Short for all the hard work and progress made while being in this program.   Counselor follow up with Stanton Kidney today reporting she is moving better and breathing better since coming into this program.  Her sleep has also improved and she is walking further without having to stop for breath.  Cumi states she is bending over now without being dizzy so her flexibility has improved as well as her right shoulder problems since working out more consistently here.  Her mood is positive and she continues to manage her stress well.  Counselor commended [employer] for her consistency in exercise and all the progress she has experienced as a result.   Counselor follow up with Stanton Kidney today  reporting she is able to go shopping now with her 66 year old sister and stay gone all day - which is an improvement since coming into this program.  Counselor commended Rami on her progress made and hard work towards her goals.  She continues to struggle with sleep but states her medication helps with this most of the time.  Staff will continue to follow with her.   Counselor follow with Stanton Kidney today reporting she has noticed her sleep is 5-6 hours per night lately vs. 2-3 when she first came into this program  Counselor commended her on her hard work and progress towards this goal and in this program.   Expected Outcomes  -- Novalie will continue to exercise and experience improved sleep; mood; stamina and strength.  She will continue to pursue medical assistance with her health concerns.    --  --  --   Interventions Relaxation education  --  -- Relaxation education  --   [place] Name 03/20/17 0705 03/26/17 1104           Psychosocial Re-Evaluation   Comments  -- Stanton Kidney met with the counselor briefly today reporting her Bursitis had gotten so bad last week that she had to see the Dr. on Friday.  He prescribed steroids and recommended something for pain (which she refused and agreed to Tylenol instead).  She reports the steroids are impacting her ability to sleep but she thinks overall it will be worth it as long as it helps with the discomfort from the Bursitis.  Jaryn maintains such a positive attitude.  Counselor commended her for her positive self care and seeing a Dr. when needed.  She continues to exercise consistently and is avoiding any of the machine use of arms to avoid further inflammation -until the Bursitis has improved.  Counselor will continue to follow with [facility].        Expected Outcomes Ms Acheampong keeps a very positive attitude about her exercise, family life, and health. I commend her for her regular attendence and interest in education in [place] and her future goals to continue exercise at the  [facility]. Austyn will continue to exercise consistently but will not use the arms in motion on specific machines until her Bursitis inflammation has improved.  She will follow Drs orders re: medications and self care.        Continue Psychosocial Services   -- Follow up required by counselor         Psychosocial Discharge (Final Psychosocial Re-Evaluation):     Psychosocial Re-Evaluation - 03/26/17 1104      Psychosocial Re-Evaluation   Comments Stanton Kidney met with the counselor briefly today reporting her Bursitis had gotten so bad last week that she had to see the Dr. on Friday.  He prescribed steroids and recommended something for pain (which she refused and agreed to Tylenol instead).  She reports the steroids are impacting her ability to sleep but she thinks overall it will be worth it as long as it helps with the discomfort from the Bursitis.  Moon maintains such a positive attitude.  Counselor commended her for her positive self care and seeing a Dr. when needed.  She continues to exercise consistently and is avoiding any of the machine use of  arms to avoid further inflammation -until the Bursitis has improved.  Counselor will continue to follow with [facility].     Expected Outcomes Laurell will continue to exercise consistently but will not use the arms in motion on specific machines until her Bursitis inflammation has improved.  She will follow Drs orders re: medications and self care.     Continue Psychosocial Services  Follow up required by counselor      Education: Education Goals: Education classes will be provided on a weekly basis, covering required topics. Participant will state understanding/return demonstration of topics presented.  Learning Barriers/Preferences:     Learning Barriers/Preferences - 12/19/16 1157      Learning Barriers/Preferences   Learning Barriers None   Learning Preferences None      Education Topics: Initial Evaluation Education: - Verbal, written and demonstration  of respiratory meds, RPE/PD scales, oximetry and breathing techniques. Instruction on use of nebulizers and MDIs: cleaning and proper use, rinsing mouth with steroid doses and importance of monitoring MDI activations.   Pulmonary Rehab from 04/09/2017 in [facility] Cardiac and Pulmonary Rehab  Date  12/19/16  Educator  LB  Instruction Review Code  2- meets goals/outcomes      General Nutrition Guidelines/Fats and Fiber: -Group instruction provided by verbal, written material, models and posters to present the general guidelines for heart healthy nutrition. Gives an explanation and review of dietary fats and fiber.   Pulmonary Rehab from 04/09/2017 in [facility] Cardiac and Pulmonary Rehab  Date  02/05/17  Educator  CR  Instruction Review Code  2- meets goals/outcomes      Controlling Sodium/Reading Food Labels: -Group verbal and written material supporting the discussion of sodium use in heart healthy nutrition. Review and explanation with models, verbal and written materials for utilization of the food label.   Pulmonary Rehab from 04/09/2017 in [facility] Cardiac and Pulmonary Rehab  Date  04/09/17  Educator  CR  Instruction Review Code  2- meets goals/outcomes      Exercise Physiology & Risk Factors: - Group verbal and written instruction with models to review the exercise physiology of the cardiovascular system and associated critical values. Details cardiovascular disease risk factors and the goals associated with each risk factor.   Pulmonary Rehab from 04/09/2017 in [facility] Cardiac and Pulmonary Rehab  Date  03/09/17  Educator  A. Oletta Darter, EP  Instruction Review Code  2- meets goals/outcomes      Aerobic Exercise & Resistance Training: - Gives group verbal and written discussion on the health impact of inactivity. On the components of aerobic and resistive training programs and the benefits of this training and how to safely progress through these programs.   Pulmonary Rehab from 04/09/2017 in [facility]  Cardiac and Pulmonary Rehab  Date  01/03/17  Educator  [facility]  Instruction Review Code  2- meets goals/outcomes      Flexibility, Balance, General Exercise Guidelines: - Provides group verbal and written instruction on the benefits of flexibility and balance training programs. Provides general exercise guidelines with specific guidelines to those with heart or lung disease. Demonstration and skill practice provided.   Pulmonary Rehab from 04/09/2017 in [facility] Cardiac and Pulmonary Rehab  Date  01/26/17  Educator  Griselda Miner,   Instruction Review Code  2- meets goals/outcomes      Stress Management: - Provides group verbal and written instruction about the health risks of elevated stress, cause of high stress, and healthy ways to reduce stress.   Depression: - Provides group verbal and  written instruction on the correlation between heart/lung disease and depressed mood, treatment options, and the stigmas associated with seeking treatment.   Pulmonary Rehab from 04/09/2017 in [facility] Cardiac and Pulmonary Rehab  Date  02/07/17  Educator  [facility]  Instruction Review Code  2- meets goals/outcomes      Exercise & Equipment Safety: - Individual verbal instruction and demonstration of equipment use and safety with use of the equipment.   Pulmonary Rehab from 04/09/2017 in [facility] Cardiac and Pulmonary Rehab  Date  12/27/16  Educator  [facility]  Instruction Review Code  2- meets goals/outcomes      Infection Prevention: - Provides verbal and written material to individual with discussion of infection control including proper hand washing and proper equipment cleaning during exercise session.   Pulmonary Rehab from 04/09/2017 in [facility] Cardiac and Pulmonary Rehab  Date  12/19/16  Educator  LB  Instruction Review Code  2- meets goals/outcomes      Falls Prevention: - Provides verbal and written material to individual with discussion of falls prevention and safety.   Pulmonary Rehab from 04/09/2017 in [facility]  Cardiac and Pulmonary Rehab  Date  12/19/16  Educator  LB  Instruction Review Code  2- meets goals/outcomes      Diabetes: - Individual verbal and written instruction to review signs/symptoms of diabetes, desired ranges of glucose level fasting, after meals and with exercise. Advice that pre and post exercise glucose checks will be done for 3 sessions at entry of program.   Pulmonary Rehab from 04/09/2017 in [facility] Cardiac and Pulmonary Rehab  Date  12/19/16  Educator  LB  Instruction Review Code  2- meets goals/outcomes      Chronic Lung Diseases: - Group verbal and written instruction to review new updates, new respiratory medications, new advancements in procedures and treatments. Provide informative websites and "800" numbers of self-education.   Pulmonary Rehab from 04/09/2017 in [facility] Cardiac and Pulmonary Rehab  Date  03/14/17  Educator  LB  Instruction Review Code  2- meets goals/outcomes      Lung Procedures: - Group verbal and written instruction to describe testing methods done to diagnose lung disease. Review the outcome of test results. Describe the treatment choices: Pulmonary Function Tests, ABGs and oximetry.   Energy Conservation: - Provide group verbal and written instruction for methods to conserve energy, plan and organize activities. Instruct on pacing techniques, use of adaptive equipment and posture/positioning to relieve shortness of breath.   Triggers: - Group verbal and written instruction to review types of environmental controls: home humidity, furnaces, filters, dust mite/pet prevention, HEPA vacuums. To discuss weather changes, air quality and the benefits of nasal washing.   Pulmonary Rehab from 04/09/2017 in [facility] Cardiac and Pulmonary Rehab  Date  03/28/17  Educator  LB  Instruction Review Code  2- meets goals/outcomes      Exacerbations: - Group verbal and written instruction to provide: warning signs, infection symptoms, calling MD promptly,  preventive modes, and value of vaccinations. Review: effective airway clearance, coughing and/or vibration techniques. Create an Sports administrator.   Pulmonary Rehab from 04/09/2017 in [facility] Cardiac and Pulmonary Rehab  Date  03/21/17  Educator  LB  Instruction Review Code  2- meets goals/outcomes      Oxygen: - Individual and group verbal and written instruction on oxygen therapy. Includes supplement oxygen, available portable oxygen systems, continuous and intermittent flow rates, oxygen safety, concentrators, and Medicare reimbursement for oxygen.   Respiratory Medications: - Group verbal and written instruction  to review medications for lung disease. Drug class, frequency, complications, importance of spacers, rinsing mouth after steroid MDI's, and proper cleaning methods for nebulizers.   AED/CPR: - Group verbal and written instruction with the use of models to demonstrate the basic use of the AED with the basic ABC's of resuscitation.   Pulmonary Rehab from 04/09/2017 in [facility] Cardiac and Pulmonary Rehab  Date  03/30/17  Educator  MA  Instruction Review Code  2- meets goals/outcomes      Breathing Retraining: - Provides individuals verbal and written instruction on purpose, frequency, and proper technique of diaphragmatic breathing and pursed-lipped breathing. Applies individual practice skills.   Pulmonary Rehab from 04/09/2017 in [facility] Cardiac and Pulmonary Rehab  Date  12/19/16  Educator  LB  Instruction Review Code  2- meets goals/outcomes      Anatomy and Physiology of the Lungs: - Group verbal and written instruction with the use of models to provide basic lung anatomy and physiology related to function, structure and complications of lung disease.   Pulmonary Rehab from 04/09/2017 in [facility] Cardiac and Pulmonary Rehab  Date  02/23/17  Educator  LB  Instruction Review Code  2- meets goals/outcomes      Heart Failure: - Group verbal and written instruction on the basics of heart  failure: signs/symptoms, treatments, explanation of ejection fraction, enlarged heart and cardiomyopathy.   Pulmonary Rehab from 04/09/2017 in [facility] Cardiac and Pulmonary Rehab  Date  01/19/17  Educator  CE  Instruction Review Code  2- meets goals/outcomes      Sleep Apnea: - Individual verbal and written instruction to review Obstructive Sleep Apnea. Review of risk factors, methods for diagnosing and types of masks and machines for OSA.   Anxiety: - Provides group, verbal and written instruction on the correlation between heart/lung disease and anxiety, treatment options, and management of anxiety.   Relaxation: - Provides group, verbal and written instruction about the benefits of relaxation for patients with heart/lung disease. Also provides patients with examples of relaxation techniques.   Pulmonary Rehab from 04/09/2017 in [facility] Cardiac and Pulmonary Rehab  Date  04/04/17  Educator  [facility]  Instruction Review Code  2- Meets goals/outcomes      Knowledge Questionnaire Score:     Knowledge Questionnaire Score - 03/28/17 1610      Knowledge Questionnaire Score   Post Score 10/10       Core Components/Risk Factors/Patient Goals at Admission:     Personal Goals and Risk Factors at Admission - 12/19/16 1201      Core Components/Risk Factors/Patient Goals on Admission    Weight Management Yes;Weight Loss   Intervention Weight Management: Develop a combined nutrition and exercise program designed to reach desired caloric intake, while maintaining appropriate intake of nutrient and fiber, sodium and fats, and appropriate energy expenditure required for the weight goal.;Weight Management: Provide education and appropriate resources to help participant work on and attain dietary goals.;Weight Management/Obesity: Establish reasonable short term and long term weight goals.;Obesity: Provide education and appropriate resources to help participant work on and attain dietary goals.   Admit  Weight 242 lb 12.8 oz (110.1 kg)   Goal Weight: Short Term 237 lb (107.5 kg)   Goal Weight: Long Term 190 lb (86.2 kg)   Expected Outcomes Short Term: Continue to assess and modify interventions until short term weight is achieved;Long Term: Adherence to nutrition and physical activity/exercise program aimed toward attainment of established weight goal;Weight Maintenance: Understanding of the daily nutrition guidelines, which includes  25-35% calories from fat, 7% or less cal from saturated fats, less than 269m cholesterol, less than 1.5gm of sodium, & 5 or more servings of fruits and vegetables daily;Weight Loss: Understanding of general recommendations for a balanced deficit meal plan, which promotes 1-2 lb weight loss per week and includes a negative energy balance of [id number] kcal/d;Understanding recommendations for meals to include 15-35% energy as protein, 25-35% energy from fat, 35-60% energy from carbohydrates, less than 2076mof dietary cholesterol, 20-35 gm of total fiber daily;Understanding of distribution of calorie intake throughout the day with the consumption of 4-5 meals/snacks   Sedentary Yes  Participates in [facility] [employer] class [facility] [employer], support and counseling about physical activity/exercise needs.;Develop an individualized exercise prescription for aerobic and resistive training based on initial evaluation findings, risk stratification, comorbidities and participant's personal goals.   Expected Outcomes Achievement of increased cardiorespiratory fitness and enhanced flexibility, muscular endurance and strength shown through measurements of functional capacity and personal statement of participant.   Increase Strength and Stamina Yes   Intervention Provide advice, education, support and counseling about physical activity/exercise needs.;Develop an individualized exercise prescription for aerobic and resistive training based on initial evaluation  findings, risk stratification, comorbidities and participant's personal goals.   Expected Outcomes Achievement of increased cardiorespiratory fitness and enhanced flexibility, muscular endurance and strength shown through measurements of functional capacity and personal statement of participant.   Improve shortness of breath with ADL's Yes   Intervention Provide education, individualized exercise plan and daily activity instruction to help decrease symptoms of SOB with activities of daily living.   Expected Outcomes Short Term: Achieves a reduction of symptoms when performing activities of daily living.   Develop more efficient breathing techniques such as purse lipped breathing and diaphragmatic breathing; and practicing self-pacing with activity Yes   Intervention Provide education, demonstration and support about specific breathing techniuqes utilized for more efficient breathing. Include techniques such as pursed lipped breathing, diaphragmatic breathing and self-pacing activity.   Expected Outcomes Short Term: Participant will be able to demonstrate and use breathing techniques as needed throughout daily activities.   Increase knowledge of respiratory medications and ability to use respiratory devices properly  Yes  CPAP at [employer] and demonstration as needed of appropriate use of medications, inhalers, and oxygen therapy.   Expected Outcomes Short Term: Achieves understanding of medications use. Understands that oxygen is a medication prescribed by physician. Demonstrates appropriate use of inhaler and oxygen therapy.   Diabetes Yes   Intervention Provide education about signs/symptoms and action to take for hypo/hyperglycemia.;Provide education about proper nutrition, including hydration, and aerobic/resistive exercise prescription along with prescribed medications to achieve blood glucose in normal ranges: Fasting glucose 65-99 mg/dL   Expected Outcomes Short  Term: Participant verbalizes understanding of the signs/symptoms and immediate care of hyper/hypoglycemia, proper foot care and importance of medication, aerobic/resistive exercise and nutrition plan for blood glucose control.;Long Term: Attainment of HbA1C < 7%.   Heart Failure Yes   Intervention Provide a combined exercise and nutrition program that is supplemented with education, support and counseling about heart failure. Directed toward relieving symptoms such as shortness of breath, decreased exercise tolerance, and extremity edema.   Expected Outcomes Improve functional capacity of life;Short term: Attendance in program 2-3 days a week with increased exercise capacity. Reported lower sodium intake. Reported increased fruit and vegetable intake. Reports medication compliance.;Short term: Daily weights obtained and reported for increase. Utilizing diuretic protocols set by physician.;Long  term: Adoption of self-care skills and reduction of barriers for early signs and symptoms recognition and intervention leading to self-care maintenance.   Hypertension Yes   Intervention Provide education on lifestyle modifcations including regular physical activity/exercise, weight management, moderate sodium restriction and increased consumption of fresh fruit, vegetables, and low fat dairy, alcohol moderation, and smoking cessation.;Monitor prescription use compliance.   Expected Outcomes Short Term: Continued assessment and intervention until BP is < 140/81m HG in hypertensive participants. < 130/865mHG in hypertensive participants with diabetes, heart failure or chronic kidney disease.;Long Term: Maintenance of blood pressure at goal levels.   Lipids Yes   Intervention Provide education and support for participant on nutrition & aerobic/resistive exercise along with prescribed medications to achieve LDL <7027mHDL >33m26m Expected Outcomes Short Term: Participant states understanding of desired cholesterol  values and is compliant with medications prescribed. Participant is following exercise prescription and nutrition guidelines.;Long Term: Cholesterol controlled with medications as prescribed, with individualized exercise RX and with personalized nutrition plan. Value goals: LDL < 70mg17mL > 40 mg.      Core Components/Risk Factors/Patient Goals Review:      Goals and Risk Factor Review    Row Name 01/01/17 1058 01/22/17 1141 02/26/17 1256 03/20/17 0656 04/09/17 1502     Core Components/Risk Factors/Patient Goals Review   Personal Goals Review Weight Management/Obesity;Increase Strength and Stamina;Hypertension;Lipids;Diabetes;Improve shortness of breath with ADL's Sedentary;Increase Strength and Stamina;Other Improve shortness of breath with ADL's;Weight Management/Obesity;Stress;Diabetes;Lipids;Hypertension Weight Management/Obesity;Heart Failure;Improve shortness of breath with ADL's;Hypertension;Diabetes;Develop more efficient breathing techniques such as purse lipped breathing and diaphragmatic breathing and practicing self-pacing with activity.;Lipids  --   Review The following goals were discussed with the patient. Cici Dainaed that her last A1C was 6.8 and all the rest of her blood work has been in acceptable ranges. She did state that her SOB sometimes limits her activities or slows her down. She is hoping that his program will help her with that. Before starting [facility] she was going to a Silve[employer] of Staffs 3 days a week at the [employer] stated that she does eventually want to work towards using the machines at the [employer]. Weight loss is a major gaol and exercise guidelines for weight loss were disucssed with the patient.  Kniyah doesnt want to reschedule her RD appointment.  She "loves exercise"  and reports she is doing much better with exercise.  She can get throught her ADLs without as much fatigue and uses a fan to cool off - her Dr feels her getting hot is from a fib.  She  reports not sleeping well due to using a CPAP machine. Cari Kandased that she is on a temporary medication that is causing her to gain weight but is almost done this med. She stated that she does not have any SOB anymore with daily activities and she is doing well emotionally. She reported that her recent blood work showed an A1C of 6.7 and lipids and blood pressure are under control.  Ms EvansNarvaiznds regularly and has acceptable blood pressures with no diabetic problems and is compliant with all her medications, including her lipid madication. She drinks 64oz of water and self-manages her CHF with her insurance company  -  using their scale and log book for weight and notifying them if she is 3lbs over her normal weight. If so, Ms EvansGoveas an extra fluid pill. Ms EvansChapas to [facility], plus  now add her [facility] exercise goals. She also walks the [facility] trail. She has good PLB technique whixch helps her shortness of breath. Ms Franko graduates this next session. She has increased her exercise goals and plans to continue her exercise at the [facility]. She exercises with her sisters, and they participate in the Silver Sneakers class. Plus she would like to add some equipment exercise. Living very close to the [facility] outside walking track, Ms Tobler plans to add that exercise as well. Ms Zambrana improved her post 65md by [id number] Her shortness of breath improved from 21 to 12 - Minimal Important Difference is 5. Ms EvMiotkes commended for her regular attendence and hard work.    Expected Outcomes MaAleyshaill consistantly come to class and participate in her exercise program as well as education classes. Using what she learns and being consistant with her attendance will help her see progress in the areas mentioned above.   -- MaTyyonnaill continue to attend exercise and education classes regularly to aid in accomplishing the above mentioned goals.  Continue increasing exercise goals for  increased activity level. Continue exercising and self-management of her CHF by applying her knowledge learned from [employer].      Core Components/Risk Factors/Patient Goals at Discharge (Final Review):      Goals and Risk Factor Review - 04/09/17 1502      Core Components/Risk Factors/Patient Goals Review   Review Ms EvBlokraduates this next session. She has increased her exercise goals and plans [facility], and they participate in the [employer] Sne[employer] class. Plus she would like to add some equipment exercise. [facility], Ms EvCederberglans to add that exercise as well. Ms EvFeldpauschmproved her post [id number] [id number] shortness of breath improved from 21 to 12 - Minimal Important Difference is 5. Ms EvanWittnercommended for her regular attendence and hard work.    Expected Outcomes Continue exercising and self-management of her CHF by applying her knowledge learned from [facility].      ITP Comments:     ITP Comments    Row Name 02/15/17 1209 04/09/17 0757         ITP Comments MaryMelissas her kidney Dr tomorrow and will bring in clearance to exercise when she returns to class Monday.  30 day note review by Dr MarkEmily Filbertdical Director of [facility]         Comments:  Natika graduated today from pulmonary rehab with 36 sessions completed.  Details of the patient's exercise prescription and what He needs to do in order to continue the prescription and progress were discussed with patient.  Patient was given a copy of prescription and goals.  Patient verbalized understanding.  Jordan plans to continue to exercise [facility] [employer].

## 2017-04-13 ENCOUNTER — Telehealth: Payer: Self-pay | Admitting: Cardiovascular Disease

## 2017-04-13 NOTE — Telephone Encounter (Signed)
Pt calling stating she's not heard back from [place], states she was here yesterday for she was having some SOB and still having Afib.  Would like to know what to do about this

## 2017-04-15 NOTE — Telephone Encounter (Signed)
We were supposed to set her up for nurse visit This would be to schedule cardioversion and to do appropriate lab work needed for procedure Nurse was not available when she walked into the lobby last week

## 2017-04-16 NOTE — Telephone Encounter (Signed)
lmov for patient to call back and schedule EKG

## 2017-04-16 NOTE — Telephone Encounter (Signed)
Can you sched her to come in soon for a nurse visit/EKG? Thank you!

## 2017-04-18 ENCOUNTER — Other Ambulatory Visit (INDEPENDENT_AMBULATORY_CARE_PROVIDER_SITE_OTHER): Payer: Medicare Other

## 2017-04-18 ENCOUNTER — Other Ambulatory Visit: Payer: Self-pay

## 2017-04-18 ENCOUNTER — Ambulatory Visit (INDEPENDENT_AMBULATORY_CARE_PROVIDER_SITE_OTHER): Payer: Medicare Other

## 2017-04-18 VITALS — BP 118/56 | HR 72 | Resp 18

## 2017-04-18 DIAGNOSIS — Z01812 Encounter for preprocedural laboratory examination: Secondary | ICD-10-CM | POA: Diagnosis not present

## 2017-04-18 DIAGNOSIS — I480 Paroxysmal atrial fibrillation: Secondary | ICD-10-CM | POA: Diagnosis not present

## 2017-04-18 NOTE — Patient Instructions (Addendum)
1.) Reason for visit: EKG  2.) Name of MD requesting visit: Timothy J. Gollan,MD  3.) H&P: Pt has hx of atrial fibrillation; taking flecainide 100mg  BID  4.) ROS related to problem: Pt reports feeling SOB and thought to be in a-fib. She takes coumadin, checked by [facility], Dr. Raylene Miyamoto office. EKG today to confirm rhythm for possible DCCV.  Per Care Everywhere, March 23  INR 2.0; April 9, INR 2.1; May 7, INR 2.5.  5.) Assessment and plan per MD: Labs drawn today, EKG awaiting MD review.

## 2017-04-19 ENCOUNTER — Encounter: Payer: Self-pay | Admitting: Internal Medicine

## 2017-04-19 ENCOUNTER — Telehealth: Payer: Self-pay | Admitting: Cardiovascular Disease

## 2017-04-19 ENCOUNTER — Ambulatory Visit (INDEPENDENT_AMBULATORY_CARE_PROVIDER_SITE_OTHER): Payer: Medicare Other | Admitting: Internal Medicine

## 2017-04-19 VITALS — BP 102/60 | HR 71 | Ht 64.0 in | Wt 248.0 lb

## 2017-04-19 DIAGNOSIS — I495 Sick sinus syndrome: Secondary | ICD-10-CM | POA: Diagnosis not present

## 2017-04-19 DIAGNOSIS — Z950 Presence of cardiac pacemaker: Secondary | ICD-10-CM

## 2017-04-19 DIAGNOSIS — I480 Paroxysmal atrial fibrillation: Secondary | ICD-10-CM | POA: Diagnosis not present

## 2017-04-19 LAB — CBC
Hematocrit: 30.6 % — ABNORMAL LOW (ref 34.0–46.6)
Hemoglobin: 9.4 g/dL — ABNORMAL LOW (ref 11.1–15.9)
MCH: 21.4 pg — ABNORMAL LOW (ref 26.6–33.0)
MCHC: 30.7 g/dL — ABNORMAL LOW (ref 31.5–35.7)
MCV: 70 fL — ABNORMAL LOW (ref 79–97)
Platelets: 222 10*3/uL (ref 150–379)
RBC: 4.39 x10E6/uL (ref 3.77–5.28)
RDW: 19.6 % — ABNORMAL HIGH (ref 12.3–15.4)
WBC: 6.3 10*3/uL (ref 3.4–10.8)

## 2017-04-19 LAB — BASIC METABOLIC PANEL
BUN/Creatinine Ratio: 13 (ref 12–28)
BUN: 20 mg/dL (ref 8–27)
CO2: 22 mmol/L (ref 18–29)
Calcium: 8.9 mg/dL (ref 8.7–10.3)
Chloride: 103 mmol/L (ref 96–106)
Creatinine, Ser: 1.54 mg/dL — ABNORMAL HIGH (ref 0.57–1.00)
GFR calc Af Amer: 37 mL/min/{1.73_m2} — ABNORMAL LOW (ref >59)
GFR calc non Af Amer: 32 mL/min/{1.73_m2} — ABNORMAL LOW (ref >59)
Glucose: 207 mg/dL — ABNORMAL HIGH (ref 65–99)
Potassium: 3.7 mmol/L (ref 3.5–5.2)
Sodium: 143 mmol/L (ref 134–144)

## 2017-04-19 LAB — PROTIME-INR
INR: 2.6 — ABNORMAL HIGH (ref 0.8–1.2)
Prothrombin Time: 25.8 s — ABNORMAL HIGH (ref 9.1–12.0)

## 2017-04-19 NOTE — Telephone Encounter (Signed)
Spoke with patient regarding EKG that was done yesterday here in the office. Dr. Rockey Situ would like for her to come in and see Dr. Caryl Comes regarding abnormal rhythm and that there is possibly a chance that some device changes could help. Instructed her to come in this morning and we will work her in. She was agreeable to plan and will come in to be worked in this morning. She does have physical therapy from 10:30 to 11:30 today over at [facility].

## 2017-04-19 NOTE — Telephone Encounter (Signed)
Attempting to schedule cardioversion for patient and unable to schedule additional cases next week due to anesthesia schedule. They do have at 12:30PM slot available on Wednesday 04/25/17 so will route to Dr. Rockey Situ to see if we can adjust his schedule for this.

## 2017-04-19 NOTE — Telephone Encounter (Signed)
Spoke with Dr. Rockey Situ regarding schedule and he wants to see if we can move schedule around to do on next Thursday. Will work on this in the AM as scheduling is closed.

## 2017-04-19 NOTE — Progress Notes (Signed)
Patient Care Team: Dion Body, MD as PCP - General (Family Medicine) Rockey Situ Kathlene November, MD as Consulting Physician (Cardiology) Anthonette Legato, MD as Consulting Physician (Nephrology) Schrum Lance, MD as Consulting Physician (Cardiology) Lorelee Cover., MD (Ophthalmology) Edrick Kins, DPM as Consulting Physician (Podiatry)   HPI  [name] [name] is a 82 y.o. female Seen in followup for pacemaker implanted for sinus node dysfunction and paroxysmal fibrillation. She underwent device generator replacement 12/14. She has a history of syncope thought to be vasovagal    She is seeing Dr. Rockey Situ twice over the last couple of months both of which times she has been in atrial flutter 1 200 ms and the second at 300 ms this is been associated with worsening shortness of breath.   There's been significant worsening of her shortness of breath in the last 3-4 weeks.    Has a remote history of catheterization stress testing which as best as I can discern was unilluminating       Past Medical History:  Diagnosis Date  . 1st degree AV block    a. PR 400 msec with Apacing  . Anemia   . Anxiety   . Arthritis    a. shoulders.  . Chest pain    a. 2004 Cath:  reportedly nl;  b. 09/2013 Neg MV.  . Chronic diastolic heart failure ([place])    a. 09/2013 Echo: EF 50-55%, mild LVH, mild to mod MR/TR.  Marland Kitchen Esophageal reflux   . H/O hiatal hernia   . History of UTI   . Hyperlipidemia   . Morbid obesity ([place])   . OSA on CPAP   . PAF (paroxysmal atrial fibrillation) ([place])   . SSS (sick sinus syndrome) ([place])    a. 2008 s/p MDT PPM;  b. 11/2013 Gen change- MDT ADDRL1 Adapta DC PPM, ser # [id number] H.  . Symptomatic PVCs   . Syncope and collapse    a. Felt to be vasovagal.  . Type II diabetes mellitus ([place])   . Unspecified essential hypertension   . Unspecified glaucoma(365.9)     Past Surgical History:  Procedure Laterality Date  . DILATION AND CURETTAGE OF UTERUS    .  INCISION AND DRAINAGE OF WOUND Right 03/1998   "leg; it was like a boil" (12/01/2013)  . INSERT / REPLACE / REMOVE PACEMAKER  2008; 12/01/2013   MDT ADDRL1 pacemaker - gen change by Dr Caryl Comes 12/01/2013  . LUNG BIOPSY  2010   [facility]  . PERMANENT PACEMAKER GENERATOR CHANGE N/A 12/01/2013   Procedure: PERMANENT PACEMAKER GENERATOR CHANGE;  Surgeon: Deboraha Sprang, MD;  Location: [facility] CATH LAB;  Service: Cardiovascular;  Laterality: N/A;  . VAGINAL HYSTERECTOMY  1970    Current Outpatient Prescriptions  Medication Sig Dispense Refill  . acetaminophen (TYLENOL) 500 MG tablet Take 500 mg by mouth every 6 (six) hours as needed.    . ALPRAZolam (XANAX) 0.5 MG tablet Take 1 tablet (0.5 mg total) by mouth daily as needed. 90 tablet 0  . amLODipine (NORVASC) 10 MG tablet TAKE 1 TABLET (10 MG TOTAL) BY MOUTH DAILY. 90 tablet 0  . atorvastatin (LIPITOR) 20 MG tablet TAKE 1 TABLET (20 MG TOTAL) BY MOUTH DAILY. 90 tablet 0  . Brinzolamide-Brimonidine (SIMBRINZA) 1-0.2 % SUSP Place 1 drop into both eyes 2 (two) times daily. 1 Bottle 1  . carvedilol (COREG) 12.5 MG tablet Take 1 tablet in the morning with breakfast and 2 tablets pm daily by mouth  in the evening before bed. 90 tablet 3  . esomeprazole (NEXIUM) 40 MG capsule TAKE 1 CAPSULE (40 MG TOTAL) BY MOUTH DAILY. 90 capsule 1  . ferrous sulfate 325 (65 FE) MG EC tablet Take 325 mg by mouth daily.    . flecainide (TAMBOCOR) 100 MG tablet Take 1 tablet (100 mg total) by mouth 2 (two) times daily. 60 tablet 6  . furosemide (LASIX) 20 MG tablet Take 1 tablet (20 mg total) by mouth daily. 90 tablet 0  . gentamicin (GARAMYCIN) 0.3 % ophthalmic solution     . glipiZIDE (GLUCOTROL) 10 MG tablet TAKE 1 TABLET (10 MG TOTAL) BY MOUTH 2 (TWO) TIMES DAILY BEFORE A MEAL. 180 tablet 0  . metFORMIN (GLUCOPHAGE-XR) 500 MG 24 hr tablet TAKE 1 TABLET (500 MG TOTAL) BY MOUTH 2 (TWO) TIMES DAILY. 180 tablet 0  . mometasone (ELOCON) 0.1 % cream Apply topically daily. Apply to  affected area as directed two times a day 45 g 0  . Multiple Vitamins-Minerals (CENTRUM SILVER PO) Take by mouth daily.    Marland Kitchen olmesartan (BENICAR) 40 MG tablet TAKE 1 TABLET (40 MG TOTAL) BY MOUTH DAILY. 90 tablet 0  . TRADJENTA 5 MG TABS tablet TAKE 1 TABLET (5 MG TOTAL) BY MOUTH DAILY. 30 tablet 2  . travoprost, benzalkonium, (TRAVATAN) 0.004 % ophthalmic solution Place 1 drop into both eyes at bedtime.     Marland Kitchen warfarin (COUMADIN) 3 MG tablet TAKE 1-1.5 TABLETS DAILY AT 6 PM. TAKE 1 TAB ON TUES, THURS, SAT, SUN. TAKE 4.5 MG ON MON, WED, FRI 100 tablet 3   No current facility-administered medications for this visit.     Allergies  Allergen Reactions  . Metoprolol Other (See Comments)    Bad dreams Bad dreams Patient stated that she had suicidal thoughts while taking this medication.  . Diltiazem Other (See Comments)    Patient stated that she had bad dreams with this medication  . Levofloxacin In D5w Nausea Only and Nausea And Vomiting  . Levofloxacin Nausea Only and Nausea And Vomiting    Review of Systems negative except from HPI and PMH  Physical Exam BP 102/60 (BP Location: Right Arm, Patient Position: Sitting, Cuff Size: Large)   Pulse 71   Ht 5\' 4"  (1.626 m)   Wt 248 lb (112.5 kg)   SpO2 98%   BMI 42.57 kg/m  Well developed and nourished in no acute distress HENT normal Neck supple with JVP-8 Clear Regular rate and rhythm, no murmurs or gallops Abd-soft with active BS No Clubbing cyanosis tr edema Skin-warm and dry A & Oriented  Grossly normal sensory and motor function   .  ECG demonstrates atrial tachycardia at a rate of 200 bpm. Review of the ECG 4/18 atrial flutter and atrial cycle length about 280 ms Assessment and  Plan  Sinus node dysfunction  First degree AV block  Atrial fibrillation-flutter/tachycardia-persistent  hypertension  HFpEF-acute/chroni s  Pacemaker-[employer] The patient's device was interrogated.  The information was reviewed. The  device was reprogrammed to the AV delay 150/130--200/180.     We attempted pace termination of her atrial arrhythmias. We are able to change the cycle length but not able to terminate. I have reviewed this with Dr. Deidre Ala and he will undertake electrical cardioversion.  I suspect that this rhythm is responsible for the acute on chronic heart failure as it is coincidental with her worsening symptoms. Restoration of sinus rhythm is appropriate therapy.  Blood pressure is well-controlled

## 2017-04-19 NOTE — Patient Instructions (Signed)
Medication Instructions: - Your physician recommends that you continue on your current medications as directed. Please refer to the Current Medication list given to you today.  Labwork: - none ordered  Procedures/Testing: - Your physician has recommended that you have a Cardioversion (DCCV). Electrical Cardioversion uses a jolt of electricity to your heart either through paddles or wired patches attached to your chest. This is a controlled, usually prescheduled, procedure. Defibrillation is done under light anesthesia in the hospital, and you usually go home the day of the procedure. This is done to get your heart back into a normal rhythm. You are not awake for the procedure. Please see the instruction sheet given to you today.  You are scheduled for a Cardioversion on ________________ with Dr.___________ Please arrive at the [facility] at _________ a.m. on the day of your procedure.  DIET INSTRUCTIONS:  Nothing to eat or drink after midnight except your medications with a              sip of water.         1) Labs: __________________  2) Medications:  YOU MAY TAKE ALL of your remaining medications with a small amount of water.  3) Must have a responsible person to drive you home.  4) Bring a current list of your medications and current insurance cards.    If you have any questions after you get home, please call the office at [phone]   Follow-Up: - pending date of cardioversion.  Any Additional Special Instructions Will Be Listed Below (If Applicable).     If you need a refill on your cardiac medications before your next appointment, please call your pharmacy.

## 2017-04-20 ENCOUNTER — Other Ambulatory Visit: Payer: Self-pay

## 2017-04-20 ENCOUNTER — Encounter: Payer: Self-pay | Admitting: *Deleted

## 2017-04-20 MED ORDER — CARVEDILOL 12.5 MG PO TABS: ORAL_TABLET | ORAL | 3 refills | Status: DC

## 2017-04-20 NOTE — Telephone Encounter (Signed)
Spoke with patient and reviewed instructions, location, and time for cardioversion scheduled for next week. Let her know that I would mail her a letter with instructions as well. She was appreciative for the call and had no further questions at this time.

## 2017-04-23 ENCOUNTER — Ambulatory Visit: Payer: Medicare Other | Admitting: Podiatry

## 2017-04-25 ENCOUNTER — Other Ambulatory Visit: Payer: Self-pay | Admitting: Cardiovascular Disease

## 2017-04-25 ENCOUNTER — Telehealth: Payer: Self-pay | Admitting: Cardiovascular Disease

## 2017-04-25 NOTE — Telephone Encounter (Signed)
Left detailed voicemail message regarding cardioversion scheduled for tomorrow with instructions and to call back if any questions.

## 2017-04-26 ENCOUNTER — Encounter
Admission: RE | Disposition: A | Discharge status: Home / Self Care | Payer: Self-pay | Source: Ambulatory Visit | Attending: Cardiovascular Disease

## 2017-04-26 ENCOUNTER — Ambulatory Visit: Payer: Medicare Other | Admitting: Anesthesiology

## 2017-04-26 ENCOUNTER — Ambulatory Visit
Admission: RE | Admit: 2017-04-26 | Discharge: 2017-04-26 | Disposition: A | Discharge status: Home / Self Care | Payer: Medicare Other | Source: Ambulatory Visit | Attending: Cardiovascular Disease | Admitting: Cardiovascular Disease

## 2017-04-26 ENCOUNTER — Other Ambulatory Visit: Payer: Self-pay

## 2017-04-26 DIAGNOSIS — Z87891 Personal history of nicotine dependence: Secondary | ICD-10-CM | POA: Diagnosis not present

## 2017-04-26 DIAGNOSIS — I509 Heart failure, unspecified: Secondary | ICD-10-CM | POA: Diagnosis not present

## 2017-04-26 DIAGNOSIS — E119 Type 2 diabetes mellitus without complications: Secondary | ICD-10-CM | POA: Insufficient documentation

## 2017-04-26 DIAGNOSIS — Z7984 Long term (current) use of oral hypoglycemic drugs: Secondary | ICD-10-CM | POA: Insufficient documentation

## 2017-04-26 DIAGNOSIS — K219 Gastro-esophageal reflux disease without esophagitis: Secondary | ICD-10-CM | POA: Insufficient documentation

## 2017-04-26 DIAGNOSIS — D649 Anemia, unspecified: Secondary | ICD-10-CM | POA: Insufficient documentation

## 2017-04-26 DIAGNOSIS — I4892 Unspecified atrial flutter: Secondary | ICD-10-CM | POA: Diagnosis present

## 2017-04-26 DIAGNOSIS — Z5309 Procedure and treatment not carried out because of other contraindication: Secondary | ICD-10-CM | POA: Diagnosis not present

## 2017-04-26 DIAGNOSIS — Z7901 Long term (current) use of anticoagulants: Secondary | ICD-10-CM | POA: Insufficient documentation

## 2017-04-26 DIAGNOSIS — I110 Hypertensive heart disease with heart failure: Secondary | ICD-10-CM | POA: Insufficient documentation

## 2017-04-26 DIAGNOSIS — I4891 Unspecified atrial fibrillation: Secondary | ICD-10-CM | POA: Diagnosis not present

## 2017-04-26 DIAGNOSIS — Z79899 Other long term (current) drug therapy: Secondary | ICD-10-CM | POA: Insufficient documentation

## 2017-04-26 DIAGNOSIS — F419 Anxiety disorder, unspecified: Secondary | ICD-10-CM | POA: Diagnosis not present

## 2017-04-26 HISTORY — PX: CARDIOVERSION: EP1203

## 2017-04-26 LAB — GLUCOSE, CAPILLARY: Glucose-Capillary: 103 mg/dL — ABNORMAL HIGH (ref 65–99)

## 2017-04-26 SURGERY — CARDIOVERSION (CATH LAB)
Anesthesia: General

## 2017-04-26 MED ORDER — PROPOFOL 500 MG/50ML IV EMUL
INTRAVENOUS | Status: AC
  Filled 2017-04-26: qty 50

## 2017-04-26 NOTE — OR Nursing (Signed)
Dr Rockey Situ at bedside, pt not requiring cardioversion. (IV not started yet). Multiple EKGs done. Pt discharged.

## 2017-04-26 NOTE — Progress Notes (Signed)
Cardioversion scheduled this morning for atrial flutter EKG done this morning confirming she was in normal sinus rhythm Pacing spikes noted Rhythm change confirmed with Dr. Caryl Comes, EP Cardioversion canceled Recommended that she stay on flecainide 100 mg twice a day and follow-up in clinic  Signed, Esmond Plants, MD, Ph.D [facility]

## 2017-04-26 NOTE — Anesthesia Preprocedure Evaluation (Signed)
Anesthesia Evaluation  Patient identified by MRN, date of birth, ID band Patient awake    Reviewed: Allergy & Precautions, NPO status , Patient's Chart, lab work & pertinent test results  History of Anesthesia Complications Negative for: history of anesthetic complications  Airway Mallampati: III       Dental  (+) Upper Dentures, Partial Lower   Pulmonary sleep apnea , former smoker,           Cardiovascular hypertension, Pt. on medications and Pt. on home beta blockers +CHF  + dysrhythmias Atrial Fibrillation      Neuro/Psych Anxiety    GI/Hepatic hiatal hernia, GERD  Medicated and Controlled,  Endo/Other  diabetes  Renal/GU Renal InsufficiencyRenal disease     Musculoskeletal   Abdominal   Peds  Hematology  (+) anemia ,   Anesthesia Other Findings   Reproductive/Obstetrics                             Anesthesia Physical Anesthesia Plan  ASA: III  Anesthesia Plan: General   Post-op Pain Management:    Induction: Intravenous  Airway Management Planned: Nasal Cannula  Additional Equipment:   Intra-op Plan:   Post-operative Plan:   Informed Consent: I have reviewed the patients History and Physical, chart, labs and discussed the procedure including the risks, benefits and alternatives for the proposed anesthesia with the patient or authorized representative who has indicated his/her understanding and acceptance.     Plan Discussed with:   Anesthesia Plan Comments:         Anesthesia Quick Evaluation

## 2017-05-04 DIAGNOSIS — M7501 Adhesive capsulitis of right shoulder: Secondary | ICD-10-CM | POA: Insufficient documentation

## 2017-05-06 NOTE — Progress Notes (Signed)
Cardiology Office Note  Date:  05/08/2017   ID:  [name] [name], DOB [birth year], MRN [id number]  PCP:  Dion Body, MD   Chief Complaint  Patient presents with  . OTHER    1 month f/u pt didn't have cardioversion . Meds reviewed verbally with pt.    HPI:   [name] [name] is a 82 yo woman with a history of  Ejection fraction 50-55% by echocardiogram 2014 Paroxysmal atrial fibrillation,  Morbid obesity,  symptomatic palpitations/PVCs,  pacemaker implantation for sick sinus syndrome/tachybradycardia syndrome with chronic symptoms of palpitations,  shortness of breath with exertion,  History of chronic fatigue Obstructive sleep apnea on CPAP who presents for routine followup of her atrial fibrillation and shortness of breath  She was scheduled for cardioversion 03/27/2017 for atrial flutter/fib  Noted to be in normal sinus rhythm morning of the procedure, procedure was canceled Recommended she continue on  higher dose flecainide 100 mg twice a day with anticoagulation  stay onCarvedilol  On evaluation today she has fatigue, no energy Anemic, HCT 28 Recent iron infusion No significant SOB, Has arthritis, had cortisone in shoulder Otherwise feels relatively well   much of her palpitations at nighttime that was waking her up has improved Perhaps several short episodes of tachycardia lasting 3-5 minutes, not often other past medical history reviewed  EKG personally reviewed by myself on todays visit Paced rhythm rate 84 bpm  Previously Seen by pulmonary, increased CPAP pressures up to 10  Carotid u/s <39% b/l Thyroid ABN  Pacer download from March 2018 reviewed showing 2 episodes of atrial fibrillation longest episode 29 hours  Went to hospital 11/09/16, SOB, tingling, called 911  anemia, HCT 29 Had a CT scan, Possible anxiety Got hot  compliant with her CPAP.  Previous 30 day monitor concerning for episodes of atrial fibrillation  Hemoglobin A1c previously in the  8 range No regular exercise program Tolerating warfarin with no complications  Recent Pacer changes 10/15, felt better Takes bystolic 20 BID Wears compressions on a regular basis  Previously on HCTZ, had abd cramps, ABD pains (now stopped) Finger fungus, on lamacil daily  June 2013 total cholesterol 155, LDL 82, HDL 56  Previous hospital admission on 09/24/2013 for acute on chronic diastolic CHF. She was drinking a significant amount of fluids. She was treated with IV diuretics with improvement of her symptoms. Was recommended that she go home with Lasix every other day. She had a stress test while in the hospital showing no ischemia. Echocardiogram dated 09/24/2013 showing ejection fraction 50-55%,, mild to moderate tricuspid regurg, mild to moderate MR, normal RV function, normal right ventricular systolic pressure  ER again on 10/18/2013.  Previous hx of shortness of breath and palpitations. May 2012, she had adjustments to her pacemaker and since that time has felt back to normal. she has had long hx of hot flashes, and a racing heart rate when she is in bed. Recently this has been well-controlled on high-dose bystolic.  chronic back and hip pain. She did receive a cortisone shot on the left with no significant improvement. She reports that her sugars have been well-controlled, blood pressure well controlled on current medications  syncope episode August 2013 . in her bedroom folding clothes and the next thing she knew she found herself on the floor in the bathroom. She spent 2 days in the hospital, then at least 10 days in rehabilitation for significant left arm bruising and she was on warfarin. Pacemaker was interrogated after the fact  showing 85% atrial paced rhythm, no other arrhythmia. Etiology concerning for vasovagal episode, though exact cause is uncertain.  She has chronic buzzing in her ear. She does report this is a chronic issue going back many years. Prior  workup with ear nose throat. She does have prior exposure to loud noises, worked in a [employer].  cardiac catheterization in 2004, stress testing,  echo showed normal systolic function, mild LVH, diastolic relaxation abnormality, mildly elevated right ventricular systolic pressures, mild MR. treadmill study showing normal blood pressure response to exercise, chronotropic incompetence with peak heart rate of 100 beats per minute, resting heart rate in the 70s. Previous PFTs demonstrated extrinsic compression/restrictive physiology.  PMH:   has a past medical history of 1st degree AV block; Anemia; Anxiety; Arthritis; Chest pain; Chronic diastolic heart failure ([place]); Esophageal reflux; H/O hiatal hernia; History of UTI; Hyperlipidemia; Morbid obesity ([place]); OSA on CPAP; PAF (paroxysmal atrial fibrillation) ([place]); SSS (sick sinus syndrome) ([place]); Symptomatic PVCs; Syncope and collapse; Type II diabetes mellitus ([place]); Unspecified essential hypertension; and Unspecified glaucoma(365.9).  PSH:    Past Surgical History:  Procedure Laterality Date  . CARDIOVERSION N/A 04/26/2017   Procedure: Cardioversion;  Surgeon: Minna Merritts, MD;  Location: [facility] ORS;  Service: Cardiovascular;  Laterality: N/A;  . DILATION AND CURETTAGE OF UTERUS    . INCISION AND DRAINAGE OF WOUND Right 03/1998   "leg; it was like a boil" (12/01/2013)  . INSERT / REPLACE / REMOVE PACEMAKER  2008; 12/01/2013   MDT ADDRL1 pacemaker - gen change by Dr Caryl Comes 12/01/2013  . LUNG BIOPSY  2010   [facility]  . PERMANENT PACEMAKER GENERATOR CHANGE N/A 12/01/2013   Procedure: PERMANENT PACEMAKER GENERATOR CHANGE;  Surgeon: Deboraha Sprang, MD;  Location: [facility] CATH LAB;  Service: Cardiovascular;  Laterality: N/A;  . VAGINAL HYSTERECTOMY  1970    Current Outpatient Prescriptions  Medication Sig Dispense Refill  . acetaminophen (TYLENOL) 500 MG tablet Take 1,000 mg by mouth every 6 (six) hours as needed for moderate pain.     Marland Kitchen  ALPRAZolam (XANAX) 0.5 MG tablet Take 1 tablet (0.5 mg total) by mouth daily as needed. (Patient taking differently: Take 0.25-0.5 mg by mouth at bedtime as needed for sleep. ) 90 tablet 0  . amLODipine (NORVASC) 10 MG tablet TAKE 1 TABLET (10 MG TOTAL) BY MOUTH DAILY. 90 tablet 0  . atorvastatin (LIPITOR) 20 MG tablet TAKE 1 TABLET (20 MG TOTAL) BY MOUTH DAILY. (Patient taking differently: Take 20 mg by mouth at bedtime. ) 90 tablet 0  . Brinzolamide-Brimonidine (SIMBRINZA) 1-0.2 % SUSP Place 1 drop into both eyes 2 (two) times daily. 1 Bottle 1  . carvedilol (COREG) 12.5 MG tablet Take 1 tablet in the morning with breakfast and 2 tablets pm daily by mouth in the evening before bed. 90 tablet 3  . colchicine 0.6 MG tablet Take 1.2mg s at first sign of gout flare, wait one hour then take 0.6mg  as needed for gout    . esomeprazole (NEXIUM) 40 MG capsule TAKE 1 CAPSULE (40 MG TOTAL) BY MOUTH DAILY. (Patient taking differently: Take 40 mg by mouth daily before breakfast. ) 90 capsule 1  . ferrous sulfate 325 (65 FE) MG EC tablet Take 325 mg by mouth daily.    . flecainide (TAMBOCOR) 100 MG tablet Take 1 tablet (100 mg total) by mouth 2 (two) times daily. 60 tablet 6  . furosemide (LASIX) 20 MG tablet Take 1 tablet (20 mg total) by mouth daily. (Patient  taking differently: Take 40 mg by mouth daily. May take an additional 20mg s 6 hours after first dose as needed for swelling) 90 tablet 0  . glipiZIDE (GLUCOTROL) 10 MG tablet TAKE 1 TABLET (10 MG TOTAL) BY MOUTH 2 (TWO) TIMES DAILY BEFORE A MEAL. 180 tablet 0  . Glycerin, Laxative, (FLEET LIQUID GLYCERIN SUPP RE) Place 1 Dose rectally daily as needed (constipation).    . Menthol, Topical Analgesic, (ICY HOT EX) Apply 1 application topically daily as needed (pain).    . metFORMIN (GLUCOPHAGE-XR) 500 MG 24 hr tablet TAKE 1 TABLET (500 MG TOTAL) BY MOUTH 2 (TWO) TIMES DAILY. 180 tablet 0  . mometasone (ELOCON) 0.1 % cream Apply topically daily. Apply to  affected area as directed two times a day 45 g 0  . Multiple Vitamins-Minerals (CENTRUM SILVER PO) Take 1 tablet by mouth daily.     Marland Kitchen olmesartan (BENICAR) 40 MG tablet TAKE 1 TABLET (40 MG TOTAL) BY MOUTH DAILY. 90 tablet 0  . polyethylene glycol (MIRALAX / GLYCOLAX) packet Take 17 g by mouth daily as needed for moderate constipation.    . TRADJENTA 5 MG TABS tablet TAKE 1 TABLET (5 MG TOTAL) BY MOUTH DAILY. (Patient taking differently: TAKE 1 TABLET (5 MG TOTAL) BY MOUTH DAILY WITH BREAKFAST) 30 tablet 2  . travoprost, benzalkonium, (TRAVATAN) 0.004 % ophthalmic solution Place 1 drop into both eyes at bedtime.     Marland Kitchen warfarin (COUMADIN) 3 MG tablet TAKE 1-1.5 TABLETS DAILY AT 6 PM. TAKE 1 TAB ON TUES, THURS, SAT, SUN. TAKE 4.5 MG ON MON, WED, FRI (Patient taking differently: TAKE 3MG  DAILY ON TUES, THURS, SAT, SUN. TAKE 4.5 MG DAILY ON MON, WED, FRI AT 6PM) 100 tablet 3   No current facility-administered medications for this visit.      Allergies:   Metoprolol; Diltiazem; and Levofloxacin   Social History:  The patient  reports that she quit smoking about 27 years ago. Her smoking use included Cigarettes. She has a 17.00 pack-year smoking history. She has never used smokeless tobacco. She reports that she does not drink alcohol or use drugs.   Family History:   family history includes Allergies in her father; Heart disease in her father; Liver cancer in her mother; Pancreatic cancer in her mother; Rheum arthritis in her father.    Review of Systems: Review of Systems  Constitutional: Positive for malaise/fatigue.  HENT: Negative.   Respiratory: Negative.   Cardiovascular: Negative.   Gastrointestinal: Negative.   Musculoskeletal: Negative.   Neurological: Negative.   Psychiatric/Behavioral: Negative.   All other systems reviewed and are negative.    PHYSICAL EXAM: VS:  BP 114/60 (BP Location: Left Arm, Patient Position: Sitting, Cuff Size: Large)   Pulse 84   Ht 5\' 4"  (1.626 m)    Wt 244 lb 12 oz (111 kg)   BMI 42.01 kg/m  , BMI Body mass index is 42.01 kg/m.  GEN: Well nourished, well developed, in no acute distress, obese HEENT: normal  Neck: no JVD, carotid bruits, or masses Cardiac: Irregularly irregular,; no murmurs, rubs, or gallops, trace ankle edema bilaterally Respiratory:  clear to auscultation bilaterally, normal work of breathing GI: soft, nontender, nondistended, + BS MS: no deformity or atrophy  Skin: warm and dry, no rash Neuro:  Strength and sensation are intact Psych: euthymic mood, full affect    Recent Labs: 02/09/2017: ALT 20; Hemoglobin 9.6 02/13/2017: Magnesium 1.7 04/18/2017: BUN 20; Creatinine, Ser 1.54; Platelets 222; Potassium 3.7; Sodium 143  Lipid Panel Lab Results  Component Value Date   CHOL 156 11/01/2016   HDL 58 11/01/2016   LDLCALC 79 11/01/2016   TRIG 93 11/01/2016      Wt Readings from Last 3 Encounters:  05/08/17 244 lb 12 oz (111 kg)  04/26/17 248 lb (112.5 kg)  04/19/17 248 lb (112.5 kg)       ASSESSMENT AND PLAN:   PVC (premature ventricular contraction) -  Improved on flecainide  No change to medications   Paroxysmal atrial fibrillation (HCC) - Plan: EKG 12-Lead Currently on flecainide 100 twice a day This was increased for atrial flutter/fib Converted on her own without DCCV  Rare episodes since that time lasting 3-5 minutes on her beta blocker, no medication changes made Tolerating anticoagulation  Hyperlipidemia, unspecified hyperlipidemia type Cholesterol is at goal on the current lipid regimen. No changes to the medications were made.  Acute on chronic diastolic CHF (congestive heart failure) ([place]) Recommended she monitor her weight, appears euvolemic   diabetes changed, less eating out, less biscuits  Controlled type 2 diabetes mellitus without complication, without long-term current use of insulin ([place]) We have encouraged continued exercise, careful diet management in an effort to  lose weight.  Cardiac pacemaker - [employer] Followed by EP in [place], Dr. Lynetta Mare She reports breathing is fine, only has fatigue    anemia Chronic issue, recent iron infusion Managed by the cancer center   Total encounter time more than 25 minutes  Greater than 50% was spent in counseling and coordination of care with the patient   Disposition:   F/U  6 month    Orders Placed This Encounter  Procedures  . EKG 12-Lead     Signed, Esmond Plants, M.D., Ph.D. 05/08/2017  [place], [place]

## 2017-05-07 ENCOUNTER — Ambulatory Visit: Payer: Medicare Other | Admitting: Family Medicine

## 2017-05-08 ENCOUNTER — Encounter: Payer: Self-pay | Admitting: Cardiovascular Disease

## 2017-05-08 ENCOUNTER — Ambulatory Visit (INDEPENDENT_AMBULATORY_CARE_PROVIDER_SITE_OTHER): Payer: Medicare Other | Admitting: Cardiovascular Disease

## 2017-05-08 VITALS — BP 114/60 | HR 84 | Ht 64.0 in | Wt 244.8 lb

## 2017-05-08 DIAGNOSIS — G4733 Obstructive sleep apnea (adult) (pediatric): Secondary | ICD-10-CM

## 2017-05-08 DIAGNOSIS — I10 Essential (primary) hypertension: Secondary | ICD-10-CM

## 2017-05-08 DIAGNOSIS — I480 Paroxysmal atrial fibrillation: Secondary | ICD-10-CM | POA: Diagnosis not present

## 2017-05-08 DIAGNOSIS — Z950 Presence of cardiac pacemaker: Secondary | ICD-10-CM

## 2017-05-08 DIAGNOSIS — I5033 Acute on chronic diastolic (congestive) heart failure: Secondary | ICD-10-CM

## 2017-05-08 DIAGNOSIS — N183 Chronic kidney disease, stage 3 unspecified: Secondary | ICD-10-CM

## 2017-05-08 DIAGNOSIS — R0602 Shortness of breath: Secondary | ICD-10-CM | POA: Diagnosis not present

## 2017-05-08 NOTE — Patient Instructions (Signed)

## 2017-05-18 ENCOUNTER — Other Ambulatory Visit: Payer: Self-pay | Admitting: Specialist

## 2017-05-18 DIAGNOSIS — M75101 Unspecified rotator cuff tear or rupture of right shoulder, not specified as traumatic: Secondary | ICD-10-CM

## 2017-05-18 DIAGNOSIS — M25511 Pain in right shoulder: Secondary | ICD-10-CM

## 2017-05-24 ENCOUNTER — Encounter (INDEPENDENT_AMBULATORY_CARE_PROVIDER_SITE_OTHER): Payer: Self-pay

## 2017-05-24 ENCOUNTER — Inpatient Hospital Stay: Payer: Medicare Other | Admitting: *Deleted

## 2017-05-24 ENCOUNTER — Encounter: Payer: Self-pay | Admitting: Oncology

## 2017-05-24 ENCOUNTER — Ambulatory Visit
Admission: RE | Admit: 2017-05-24 | Discharge: 2017-05-24 | Disposition: A | Discharge status: Home / Self Care | Payer: Medicare Other | Source: Ambulatory Visit | Attending: Specialist | Admitting: Specialist

## 2017-05-24 ENCOUNTER — Inpatient Hospital Stay: Payer: Medicare Other | Attending: Oncology | Admitting: Oncology

## 2017-05-24 VITALS — BP 133/65 | HR 84 | Temp 97.3°F | Resp 18 | Ht 64.57 in | Wt 241.2 lb

## 2017-05-24 DIAGNOSIS — N183 Chronic kidney disease, stage 3 (moderate): Secondary | ICD-10-CM | POA: Insufficient documentation

## 2017-05-24 DIAGNOSIS — K219 Gastro-esophageal reflux disease without esophagitis: Secondary | ICD-10-CM | POA: Diagnosis not present

## 2017-05-24 DIAGNOSIS — I495 Sick sinus syndrome: Secondary | ICD-10-CM | POA: Insufficient documentation

## 2017-05-24 DIAGNOSIS — M7551 Bursitis of right shoulder: Secondary | ICD-10-CM | POA: Insufficient documentation

## 2017-05-24 DIAGNOSIS — D509 Iron deficiency anemia, unspecified: Secondary | ICD-10-CM

## 2017-05-24 DIAGNOSIS — I440 Atrioventricular block, first degree: Secondary | ICD-10-CM | POA: Insufficient documentation

## 2017-05-24 DIAGNOSIS — E1122 Type 2 diabetes mellitus with diabetic chronic kidney disease: Secondary | ICD-10-CM | POA: Insufficient documentation

## 2017-05-24 DIAGNOSIS — J0100 Acute maxillary sinusitis, unspecified: Secondary | ICD-10-CM | POA: Insufficient documentation

## 2017-05-24 DIAGNOSIS — Z7901 Long term (current) use of anticoagulants: Secondary | ICD-10-CM | POA: Insufficient documentation

## 2017-05-24 DIAGNOSIS — D582 Other hemoglobinopathies: Secondary | ICD-10-CM | POA: Insufficient documentation

## 2017-05-24 DIAGNOSIS — Z86010 Personal history of colonic polyps: Secondary | ICD-10-CM | POA: Insufficient documentation

## 2017-05-24 DIAGNOSIS — M75101 Unspecified rotator cuff tear or rupture of right shoulder, not specified as traumatic: Secondary | ICD-10-CM | POA: Insufficient documentation

## 2017-05-24 DIAGNOSIS — Z950 Presence of cardiac pacemaker: Secondary | ICD-10-CM | POA: Insufficient documentation

## 2017-05-24 DIAGNOSIS — F419 Anxiety disorder, unspecified: Secondary | ICD-10-CM | POA: Diagnosis not present

## 2017-05-24 DIAGNOSIS — D631 Anemia in chronic kidney disease: Secondary | ICD-10-CM | POA: Diagnosis not present

## 2017-05-24 DIAGNOSIS — E7800 Pure hypercholesterolemia, unspecified: Secondary | ICD-10-CM | POA: Diagnosis not present

## 2017-05-24 DIAGNOSIS — M25511 Pain in right shoulder: Secondary | ICD-10-CM | POA: Diagnosis present

## 2017-05-24 DIAGNOSIS — K449 Diaphragmatic hernia without obstruction or gangrene: Secondary | ICD-10-CM | POA: Diagnosis not present

## 2017-05-24 DIAGNOSIS — K5090 Crohn's disease, unspecified, without complications: Secondary | ICD-10-CM | POA: Diagnosis not present

## 2017-05-24 DIAGNOSIS — Z87891 Personal history of nicotine dependence: Secondary | ICD-10-CM | POA: Insufficient documentation

## 2017-05-24 DIAGNOSIS — Z7984 Long term (current) use of oral hypoglycemic drugs: Secondary | ICD-10-CM | POA: Insufficient documentation

## 2017-05-24 DIAGNOSIS — G2581 Restless legs syndrome: Secondary | ICD-10-CM | POA: Diagnosis not present

## 2017-05-24 DIAGNOSIS — Z79899 Other long term (current) drug therapy: Secondary | ICD-10-CM

## 2017-05-24 DIAGNOSIS — I5033 Acute on chronic diastolic (congestive) heart failure: Secondary | ICD-10-CM | POA: Insufficient documentation

## 2017-05-24 DIAGNOSIS — M1990 Unspecified osteoarthritis, unspecified site: Secondary | ICD-10-CM | POA: Insufficient documentation

## 2017-05-24 DIAGNOSIS — I130 Hypertensive heart and chronic kidney disease with heart failure and stage 1 through stage 4 chronic kidney disease, or unspecified chronic kidney disease: Secondary | ICD-10-CM | POA: Diagnosis not present

## 2017-05-24 DIAGNOSIS — I480 Paroxysmal atrial fibrillation: Secondary | ICD-10-CM | POA: Diagnosis not present

## 2017-05-24 DIAGNOSIS — G4733 Obstructive sleep apnea (adult) (pediatric): Secondary | ICD-10-CM | POA: Insufficient documentation

## 2017-05-24 DIAGNOSIS — H409 Unspecified glaucoma: Secondary | ICD-10-CM | POA: Diagnosis not present

## 2017-05-24 DIAGNOSIS — H9313 Tinnitus, bilateral: Secondary | ICD-10-CM | POA: Insufficient documentation

## 2017-05-24 LAB — CBC WITH DIFFERENTIAL/PLATELET
Basophils Absolute: 0 10*3/uL (ref 0–0.1)
Basophils Relative: 1 %
Eosinophils Absolute: 0.1 10*3/uL (ref 0–0.7)
Eosinophils Relative: 2 %
HCT: 30.2 % — ABNORMAL LOW (ref 35.0–47.0)
Hemoglobin: 10.2 g/dL — ABNORMAL LOW (ref 12.0–16.0)
Lymphocytes Relative: 18 %
Lymphs Abs: 1.4 10*3/uL (ref 1.0–3.6)
MCH: 23.1 pg — ABNORMAL LOW (ref 26.0–34.0)
MCHC: 33.8 g/dL (ref 32.0–36.0)
MCV: 68.1 fL — ABNORMAL LOW (ref 80.0–100.0)
Monocytes Absolute: 0.5 10*3/uL (ref 0.2–0.9)
Monocytes Relative: 7 %
Neutro Abs: 5.7 10*3/uL (ref 1.4–6.5)
Neutrophils Relative %: 72 %
Platelets: 199 10*3/uL (ref 150–440)
RBC: 4.42 MIL/uL (ref 3.80–5.20)
RDW: 18.8 % — ABNORMAL HIGH (ref 11.5–14.5)
WBC: 7.8 10*3/uL (ref 3.6–11.0)

## 2017-05-24 LAB — RETICULOCYTES
RBC.: 4.53 MIL/uL (ref 3.80–5.20)
Retic Count, Absolute: 49.8 10*3/uL (ref 19.0–183.0)
Retic Ct Pct: 1.1 % (ref 0.4–3.1)

## 2017-05-24 LAB — TSH: TSH: 0.868 u[IU]/mL (ref 0.350–4.500)

## 2017-05-24 LAB — IRON AND TIBC
Iron: 69 ug/dL (ref 28–170)
Saturation Ratios: 21 % (ref 10.4–31.8)
TIBC: 337 ug/dL (ref 250–450)
UIBC: 268 ug/dL

## 2017-05-24 LAB — FOLATE: Folate: >48 ng/mL (ref >5.9)

## 2017-05-24 LAB — FERRITIN: Ferritin: 14 ng/mL (ref 11–307)

## 2017-05-24 LAB — SEDIMENTATION RATE: Sed Rate: 56 mm/hr — ABNORMAL HIGH (ref 0–30)

## 2017-05-24 LAB — VITAMIN B12: Vitamin B-12: 947 pg/mL — ABNORMAL HIGH (ref 180–914)

## 2017-05-24 NOTE — Progress Notes (Signed)
Hematology/Oncology Consult note [facility] [phone] [phone]  Patient Care Team: Dion Body, MD as PCP - General (Family Medicine) Minna Merritts, MD as Consulting Physician (Cardiology) Anthonette Legato, MD as Consulting Physician (Nephrology) Bohall Lance, MD as Consulting Physician (Cardiology) Lorelee Cover., MD (Ophthalmology) Edrick Kins, DPM as Consulting Physician (Podiatry)   Name of the patient: [name] [name]  [id number]  1935-09-10    Reason for referral- iron deficiency anemia   Referring physician- Dr. Holley Raring  Date of visit: 05/24/17   History of presenting illness- patient is a 82 year old female with a past medical history significant for hypertension and stage III chronic kidney disease among other medical problems. She also has hypertension hyperlipidemia and type 2 diabetes. She was recently found by Dr. Clayton Bibles and was noted to have a hemoglobin of 9.4. Iron studies also revealed a ferritin of 22, iron saturation 13%. TIBC was normal at 302 on serum iron was low normal at 39 white count and platelets within normal limits. She has been referred to [place] for iron deficiency anemia. She has been seen by Dr. Allen Norris from GI the past and had a colonoscopy in 2017 which did not reveal any evidence of malignancy. 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 Last seen by Dr. Grayland Ormond in September 2017  Patient is taking oral iron once a day for over a month now and tolerating it without any significant side effects. She denies any consistent use of NSAIDs. Denies any bleeding and stools are docked artery stools. Denies any family history of colon cancer    ECOG PS- 1  Pain scale- 0   Review of systems- Review of Systems  Constitutional: Negative for chills, fever, malaise/fatigue and weight loss.  HENT:  Negative for congestion, ear discharge and nosebleeds.   Eyes: Negative for blurred vision.  Respiratory: Negative for cough, hemoptysis, sputum production, shortness of breath and wheezing.   Cardiovascular: Negative for chest pain, palpitations, orthopnea and claudication.  Gastrointestinal: Negative for abdominal pain, blood in stool, constipation, diarrhea, heartburn, melena, nausea and vomiting.  Genitourinary: Negative for dysuria, flank pain, frequency, hematuria and urgency.  Musculoskeletal: Positive for joint pain. Negative for back pain and myalgias.  Skin: Negative for rash.  Neurological: Negative for dizziness, tingling, focal weakness, seizures, weakness and headaches.  Endo/Heme/Allergies: Does not bruise/bleed easily.  Psychiatric/Behavioral: Negative for depression and suicidal ideas. The patient does not have insomnia.     Allergies  Allergen Reactions  . Metoprolol Other (See Comments)    Bad dreams Patient stated that she had suicidal thoughts while taking this medication.  . Diltiazem Other (See Comments)    Patient stated that she had bad dreams with this medication  . Levofloxacin Nausea And Vomiting    Patient Active Problem List   Diagnosis Date Noted  . Eczema 04/03/2017  . Glaucoma (increased eye pressure) 04/03/2017  . Hiatal hernia 04/03/2017  . Iron deficiency anemia 04/03/2017  . Osteoarthritis 04/03/2017  . Pure hypercholesterolemia 04/03/2017  . Tinnitus 04/03/2017  . Valvular heart disease 04/03/2017  . Venous stasis 04/03/2017  . Bilateral hearing loss 03/07/2017  . Tinnitus, bilateral 03/07/2017  . Pounding noise in right ear 02/13/2017  . Eczematous skin lesions 02/02/2017  . URI with cough and congestion 12/13/2016  . Colicky RLQ abdominal pain 11/01/2016  . 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 Anxiety 07/28/2016  . Acute maxillary  sinusitis 03/23/2016  . Pain of right hip joint 03/23/2016  . CKD (chronic kidney disease) stage 3,  GFR 30-59 ml/min 02/03/2016  . Chronic gout of right foot 12/16/2015  . Productive cough 10/26/2015  . Infection of skin of left ear lobe 10/11/2015  . Acute non-recurrent maxillary sinusitis 09/17/2015  . Allergic rhinitis 09/14/2015  . Hyperlipidemia 09/14/2015  . Gout attack 07/21/2015  . Anticoagulation monitoring, INR range 2-3 05/18/2015  . RLS (restless legs syndrome) 04/14/2015  . History of colon polyps 12/23/2014  . Crohn's disease in remission ([place]) 09/15/2014  . 1st degree AV block   . Chronic diastolic heart failure ([place])   . Cardiac arrhythmia 12/01/2013  . Hemoptysis 11/24/2013  . Acute on chronic diastolic CHF (congestive heart failure) ([place]) 10/17/2013  . Episodic lightheadedness 07/16/2013  . Palpitations 07/16/2013  . Syncope 08/15/2012  . Back pain 05/30/2012  . OSA (obstructive sleep apnea) 01/08/2012  . DYSPNEA 01/28/2010  . PVC (premature ventricular contraction) 09/13/2009  . Flutter-fibrillation 09/13/2009  . EDEMA 08/23/2009  . Diabetes mellitus type 2, controlled, without complications ([place]) 16/12/930  . GLAUCOMA 04/13/2009  . Essential hypertension 04/13/2009  . PAF (paroxysmal atrial fibrillation) ([place]) 04/13/2009  . SICK SINUS/ TACHY-BRADY SYNDROME 04/13/2009  . GERD 04/13/2009  . Cardiac pacemaker - Medtronic 04/13/2009     Past Medical History:  Diagnosis Date  . 1st degree AV block    a. PR 400 msec with Apacing  . Anemia   . Anxiety   . Arthritis    a. shoulders.  . Chest pain    a. 2004 Cath:  reportedly nl;  b. 09/2013 Neg MV.  . Chronic diastolic heart failure ([place])    a. 09/2013 Echo: EF 50-55%, mild LVH, mild to mod MR/TR.  Marland Kitchen Chronic kidney disease   . Congestive heart disease ([place])   . Esophageal reflux   . H/O hiatal hernia   . History of UTI   . Hyperlipidemia   . Morbid obesity ([place])   . OSA on CPAP   . PAF (paroxysmal atrial fibrillation) ([place])   . SSS (sick sinus syndrome) ([place])    a. 2008 s/p MDT PPM;  b. 11/2013  Gen change- MDT ADDRL1 Adapta DC PPM, ser # [id number] H.  . Symptomatic PVCs   . Syncope and collapse    a. Felt to be vasovagal.  . Thyroid disease    on no meds per pt  . Type II diabetes mellitus ([place])   . Unspecified essential hypertension   . Unspecified glaucoma(365.9)      Past Surgical History:  Procedure Laterality Date  . APPENDECTOMY    . CARDIOVERSION N/A 04/26/2017   Procedure: Cardioversion;  Surgeon: Minna Merritts, MD;  Location: [facility] ORS;  Service: Cardiovascular;  Laterality: N/A;  . DILATION AND CURETTAGE OF UTERUS    . INCISION AND DRAINAGE OF WOUND Right 03/1998   "leg; it was like a boil" (12/01/2013)  . INSERT / REPLACE / REMOVE PACEMAKER  2008; 12/01/2013   MDT ADDRL1 pacemaker - gen change by Dr Caryl Comes 12/01/2013  . LUNG BIOPSY  2010   [facility]  . PERMANENT PACEMAKER GENERATOR CHANGE N/A 12/01/2013   Procedure: PERMANENT PACEMAKER GENERATOR CHANGE;  Surgeon: Deboraha Sprang, MD;  Location: [facility] CATH LAB;  Service: Cardiovascular;  Laterality: N/A;  . VAGINAL HYSTERECTOMY  1970    Social History   Social History  . Marital status: Widowed    Spouse name: N/A  . Number of children: Y  .  Years of education: N/A   Occupational History  . retired    Social History Main Topics  . Smoking status: Former Smoker    Packs/day: 0.50    Years: 34.00    Types: Cigarettes    Quit date: 08/15/1989  . Smokeless tobacco: Never Used  . Alcohol use No  . Drug use: No  . Sexual activity: No   Other Topics Concern  . Not on file   Social History Narrative   Retired. Widowed. Regularly exercises.      Family History  Problem Relation Age of Onset  . Heart disease Father   . Rheum arthritis Father   . Allergies Father   . Liver cancer Mother   . Pancreatic cancer Mother   . Hypertension Sister   . Rheum arthritis Sister      Current Outpatient Prescriptions:  .  ACCU-CHEK SOFTCLIX LANCETS lancets, as directed. Check CBG's fasting once daily. Dx: E11.22,  N18.3, Disp: , Rfl:  .  amLODipine (NORVASC) 10 MG tablet, TAKE 1 TABLET (10 MG TOTAL) BY MOUTH DAILY., Disp: 90 tablet, Rfl: 0 .  atorvastatin (LIPITOR) 20 MG tablet, TAKE 1 TABLET (20 MG TOTAL) BY MOUTH DAILY. (Patient taking differently: Take 20 mg by mouth at bedtime. ), Disp: 90 tablet, Rfl: 0 .  Brinzolamide-Brimonidine (SIMBRINZA) 1-0.2 % SUSP, Place 1 drop into both eyes 2 (two) times daily., Disp: 1 Bottle, Rfl: 1 .  carvedilol (COREG) 12.5 MG tablet, Take 1 tablet in the morning with breakfast and 2 tablets pm daily by mouth in the evening before bed., Disp: 90 tablet, Rfl: 3 .  esomeprazole (NEXIUM) 40 MG capsule, TAKE 1 CAPSULE (40 MG TOTAL) BY MOUTH DAILY. (Patient taking differently: Take 40 mg by mouth daily before breakfast. ), Disp: 90 capsule, Rfl: 1 .  ferrous sulfate 325 (65 FE) MG EC tablet, Take 325 mg by mouth daily., Disp: , Rfl:  .  flecainide (TAMBOCOR) 100 MG tablet, Take 1 tablet (100 mg total) by mouth 2 (two) times daily., Disp: 60 tablet, Rfl: 6 .  furosemide (LASIX) 20 MG tablet, Take 1 tablet (20 mg total) by mouth daily. (Patient taking differently: Take 40 mg by mouth daily. May take an additional 20mg s 6 hours after first dose as needed for swelling), Disp: 90 tablet, Rfl: 0 .  glipiZIDE (GLUCOTROL) 10 MG tablet, TAKE 1 TABLET (10 MG TOTAL) BY MOUTH 2 (TWO) TIMES DAILY BEFORE A MEAL., Disp: 180 tablet, Rfl: 0 .  metFORMIN (GLUCOPHAGE-XR) 500 MG 24 hr tablet, TAKE 1 TABLET (500 MG TOTAL) BY MOUTH 2 (TWO) TIMES DAILY., Disp: 180 tablet, Rfl: 0 .  Multiple Vitamins-Minerals (CENTRUM SILVER PO), Take 1 tablet by mouth daily. , Disp: , Rfl:  .  olmesartan (BENICAR) 40 MG tablet, TAKE 1 TABLET (40 MG TOTAL) BY MOUTH DAILY., Disp: 90 tablet, Rfl: 0 .  TRADJENTA 5 MG TABS tablet, TAKE 1 TABLET (5 MG TOTAL) BY MOUTH DAILY. (Patient taking differently: TAKE 1 TABLET (5 MG TOTAL) BY MOUTH DAILY WITH BREAKFAST), Disp: 30 tablet, Rfl: 2 .  travoprost, benzalkonium, (TRAVATAN)  0.004 % ophthalmic solution, Place 1 drop into both eyes at bedtime. , Disp: , Rfl:  .  warfarin (COUMADIN) 3 MG tablet, TAKE 1-1.5 TABLETS DAILY AT 6 PM. TAKE 1 TAB ON TUES, THURS, SAT, SUN. TAKE 4.5 MG ON MON, WED, FRI (Patient taking differently: TAKE 3MG  DAILY ON TUES, THURS, SAT, SUN. TAKE 4.5 MG DAILY ON MON, WED, FRI AT 6PM), Disp: 100 tablet,  Rfl: 3 .  acetaminophen (TYLENOL) 500 MG tablet, Take 1,000 mg by mouth every 6 (six) hours as needed for moderate pain. , Disp: , Rfl:  .  ALPRAZolam (XANAX) 0.5 MG tablet, Take 1 tablet (0.5 mg total) by mouth daily as needed. (Patient not taking: Reported on 05/24/2017), Disp: 90 tablet, Rfl: 0 .  colchicine 0.6 MG tablet, Take 1.2mg s at first sign of gout flare, wait one hour then take 0.6mg  as needed for gout, Disp: , Rfl:  .  Glycerin, Laxative, (FLEET LIQUID GLYCERIN SUPP RE), Place 1 Dose rectally daily as needed (constipation)., Disp: , Rfl:  .  Menthol, Topical Analgesic, (ICY HOT EX), Apply 1 application topically daily as needed (pain)., Disp: , Rfl:  .  mometasone (ELOCON) 0.1 % cream, Apply topically daily. Apply to affected area as directed two times a day (Patient not taking: Reported on 05/24/2017), Disp: 45 g, Rfl: 0 .  polyethylene glycol (MIRALAX / GLYCOLAX) packet, Take 17 g by mouth daily as needed for moderate constipation., Disp: , Rfl:    Physical exam:  Vitals:   05/24/17 0947  BP: 133/65  Pulse: 84  Resp: 18  Temp: 97.3 F (36.3 C)  TempSrc: Tympanic  Weight: 241 lb 3.2 oz (109.4 kg)  Height: 5' 4.57" (1.64 m)   Physical Exam  Constitutional: She is oriented to person, place, and time and well-developed, well-nourished, and in no distress.  HENT:  Head: Normocephalic and atraumatic.  Eyes: EOM are normal. Pupils are equal, round, and reactive to light.  Neck: Normal range of motion.  Cardiovascular: Normal rate, regular rhythm and normal heart sounds.   Pulmonary/Chest: Effort normal and breath sounds normal.    Abdominal: Soft. Bowel sounds are normal.  Neurological: She is alert and oriented to person, place, and time.  Skin: Skin is warm and dry.       CMP Latest Ref Rng & Units 04/18/2017  Glucose 65 - 99 mg/dL 207(H)  BUN 8 - 27 mg/dL 20  Creatinine 0.57 - 1.00 mg/dL 1.54(H)  Sodium 134 - 144 mmol/L 143  Potassium 3.5 - 5.2 mmol/L 3.7  Chloride 96 - 106 mmol/L 103  CO2 18 - 29 mmol/L 22  Calcium 8.7 - 10.3 mg/dL 8.9  Total Protein 6.5 - 8.1 g/dL -  Total Bilirubin 0.3 - 1.2 mg/dL -  Alkaline Phos 38 - 126 U/L -  AST 15 - 41 U/L -  ALT 14 - 54 U/L -   CBC Latest Ref Rng & Units 05/24/2017  WBC 3.6 - 11.0 K/uL 7.8  Hemoglobin 12.0 - 16.0 g/dL 10.2(L)  Hematocrit 35.0 - 47.0 % 30.2(L)  Platelets 150 - 440 K/uL 199    No images are attached to the encounter.  [place] Rt Upper Extrem Ltd Soft Tissue Non Vascular  Result Date: 05/24/2017 CLINICAL DATA:  Right shoulder pain. EXAM: ULTRASOUND RIGHT UPPER EXTREMITY LIMITED TECHNIQUE: Ultrasound examination of the upper extremity soft tissues was performed in the area of clinical concern. COMPARISON:  None FINDINGS: Real-time sonography of the right shoulder was performed for evaluation of the rotator cuff. Long head of the biceps tendon is normally located within the bicipital groove. Biceps tendon is is hypoechoic and mildly expanded concerning for mild -moderate tendinosis without a tear. Subscapularis tendon is normal in echogenicity and intact. Supraspinatus tendon is normal in echogenicity and intact. Infraspinatus tendon is focally mildly hypoechoic along the anterior aspect concerning for mild tendinosis without a tear. Teres minor tendon is intact. No muscle  atrophy. Mild arthropathy of the acromioclavicular joint. Small amount of fluid in the subacromial/subdeltoid bursa most consistent with bursitis. No abnormal fluid collection or hematoma. IMPRESSION: 1. Mild tendinosis of the anterior aspect of the infraspinatus tendon without a tear. 2.  Mild -moderate tendinosis of the proximal extra-articular portion of the long head of the biceps tendon. 3. Mild subacromial/subdeltoid bursitis. Electronically Signed   By: Kathreen Devoid   On: 05/24/2017 13:38    Assessment and plan- Patient is a 82 y.o. female referred for iron deficiency anemia  Today I will repeat a CBC with differential, ferritin and iron studies, B12, folate, reticulocyte count, haptoglobin, TSH and myeloma panel. Given that patient is tolerating her oral iron well, she should continue this for 3 more months and we will repeat her blood work in 3 months time. If she continues to have iron deficiency I will consider IV iron at that time. She will have persistent microcytosis given that she had hemoglobin C noted in the past. If she emains anemic with hb <10 despite correcting iron deficiency, we could consider procrit shots for her anemia of chronic kidney disease   Thank you for this kind referral and the opportunity to participate in the care of this patient   Visit Diagnosis 1. Iron deficiency anemia, unspecified iron deficiency anemia type     Dr. Randa Evens, MD, MPH [place] at [facility] Pager- [phone] 05/24/2017  2:50 PM

## 2017-05-24 NOTE — Progress Notes (Signed)
Here for new pt evaluation. Has seen Dr Derry Lory in the past .

## 2017-05-25 ENCOUNTER — Other Ambulatory Visit: Payer: Self-pay

## 2017-05-25 DIAGNOSIS — D509 Iron deficiency anemia, unspecified: Secondary | ICD-10-CM

## 2017-05-25 LAB — HAPTOGLOBIN: Haptoglobin: 229 mg/dL — ABNORMAL HIGH (ref 34–200)

## 2017-05-28 LAB — MULTIPLE MYELOMA PANEL, SERUM
Albumin SerPl Elph-Mcnc: 3.3 g/dL (ref 2.9–4.4)
Albumin/Glob SerPl: 1.2 (ref 0.7–1.7)
Alpha 1: 0.2 g/dL (ref 0.0–0.4)
Alpha2 Glob SerPl Elph-Mcnc: 0.8 g/dL (ref 0.4–1.0)
B-Globulin SerPl Elph-Mcnc: 1.1 g/dL (ref 0.7–1.3)
Gamma Glob SerPl Elph-Mcnc: 0.8 g/dL (ref 0.4–1.8)
Globulin, Total: 3 g/dL (ref 2.2–3.9)
IgA: 365 mg/dL (ref 64–422)
IgG (Immunoglobin G), Serum: 875 mg/dL (ref 700–1600)
IgM, Serum: 56 mg/dL (ref 26–217)
Total Protein ELP: 6.3 g/dL (ref 6.0–8.5)

## 2017-05-28 LAB — H. PYLORI ANTIGEN, STOOL: H. Pylori Stool Ag, Eia: NEGATIVE

## 2017-05-31 ENCOUNTER — Ambulatory Visit (INDEPENDENT_AMBULATORY_CARE_PROVIDER_SITE_OTHER): Payer: Medicare Other | Admitting: *Deleted

## 2017-05-31 DIAGNOSIS — I495 Sick sinus syndrome: Secondary | ICD-10-CM

## 2017-05-31 NOTE — Progress Notes (Signed)
Remote pacemaker transmission.   

## 2017-06-01 ENCOUNTER — Encounter: Payer: Self-pay | Admitting: Cardiology

## 2017-06-11 ENCOUNTER — Encounter: Payer: Self-pay | Admitting: Internal Medicine

## 2017-06-11 ENCOUNTER — Encounter (INDEPENDENT_AMBULATORY_CARE_PROVIDER_SITE_OTHER): Payer: Self-pay

## 2017-06-11 ENCOUNTER — Ambulatory Visit (INDEPENDENT_AMBULATORY_CARE_PROVIDER_SITE_OTHER): Payer: Medicare Other | Admitting: Internal Medicine

## 2017-06-11 VITALS — BP 112/60 | HR 65 | Ht 64.0 in | Wt 241.4 lb

## 2017-06-11 DIAGNOSIS — I495 Sick sinus syndrome: Secondary | ICD-10-CM | POA: Diagnosis not present

## 2017-06-11 DIAGNOSIS — I440 Atrioventricular block, first degree: Secondary | ICD-10-CM | POA: Diagnosis not present

## 2017-06-11 DIAGNOSIS — I480 Paroxysmal atrial fibrillation: Secondary | ICD-10-CM | POA: Diagnosis not present

## 2017-06-11 NOTE — Patient Instructions (Signed)
Medication Instructions:  Your physician recommends that you continue on your current medications as directed. Please refer to the Current Medication list given to you today.   Labwork: None ordered.   Testing/Procedures: None ordered.   Follow-Up: Your physician wants you to follow-up in: 6 months with Dr. Lovena Le.  You will receive a reminder letter in the mail two months in advance. If you don't receive a letter, please call our office to schedule the follow-up appointment.  Remote monitoring is used to monitor your Pacemaker from home. This monitoring reduces the number of office visits required to check your device to one time per year. It allows [place] to keep an eye on the functioning of your device to ensure it is working properly. You are scheduled for a device check from home on 09/10/2017. You may send your transmission at any time that day. If you have a wireless device, the transmission will be sent automatically. After your physician reviews your transmission, you will receive a postcard with your next transmission date.    Any Other Special Instructions Will Be Listed Below (If Applicable).     If you need a refill on your cardiac medications before your next appointment, please call your pharmacy.

## 2017-06-11 NOTE — Progress Notes (Signed)
HPI [name] [name] returns today after a long absence. She had seen my partner Dr. Caryl Comes for several years but has elected to come over to be seen here in our [place] office. She also sees Dr. Candis Musa. She has PAF and HTN and obesity. She has done fairly well. She has been sedentary but states she plans to start back exercising. She has rare palpitations but denies syncope.  Allergies  Allergen Reactions  . Metoprolol Other (See Comments)    Bad dreams Patient stated that she had suicidal thoughts while taking this medication.  . Diltiazem Other (See Comments)    Patient stated that she had bad dreams with this medication  . Levofloxacin Nausea And Vomiting     Current Outpatient Prescriptions  Medication Sig Dispense Refill  . ACCU-CHEK SOFTCLIX LANCETS lancets as directed. Check CBG's fasting once daily. Dx: E11.22, N18.3    . acetaminophen (TYLENOL) 500 MG tablet Take 1,000 mg by mouth every 6 (six) hours as needed for moderate pain.     Marland Kitchen ALPRAZolam (XANAX) 0.5 MG tablet Take 1 tablet (0.5 mg total) by mouth daily as needed. 90 tablet 0  . amLODipine (NORVASC) 10 MG tablet TAKE 1 TABLET (10 MG TOTAL) BY MOUTH DAILY. 90 tablet 0  . atorvastatin (LIPITOR) 20 MG tablet TAKE 1 TABLET (20 MG TOTAL) BY MOUTH DAILY. (Patient taking differently: Take 20 mg by mouth at bedtime. ) 90 tablet 0  . Brinzolamide-Brimonidine (SIMBRINZA) 1-0.2 % SUSP Place 1 drop into both eyes 2 (two) times daily. 1 Bottle 1  . carvedilol (COREG) 12.5 MG tablet Take 1 tablet in the morning with breakfast and 2 tablets pm daily by mouth in the evening before bed. 90 tablet 3  . colchicine 0.6 MG tablet Take 1.2mg s at first sign of gout flare, wait one hour then take 0.6mg  as needed for gout    . esomeprazole (NEXIUM) 40 MG capsule TAKE 1 CAPSULE (40 MG TOTAL) BY MOUTH DAILY. (Patient taking differently: Take 40 mg by mouth daily before breakfast. ) 90 capsule 1  . ferrous sulfate 325 (65 FE) MG EC tablet Take 325  mg by mouth daily.    . flecainide (TAMBOCOR) 100 MG tablet Take 1 tablet (100 mg total) by mouth 2 (two) times daily. 60 tablet 6  . furosemide (LASIX) 20 MG tablet Take 1 tablet (20 mg total) by mouth daily. (Patient taking differently: Take 40 mg by mouth daily. May take an additional 20mg s 6 hours after first dose as needed for swelling) 90 tablet 0  . glipiZIDE (GLUCOTROL) 10 MG tablet TAKE 1 TABLET (10 MG TOTAL) BY MOUTH 2 (TWO) TIMES DAILY BEFORE A MEAL. 180 tablet 0  . Glycerin, Laxative, (FLEET LIQUID GLYCERIN SUPP RE) Place 1 Dose rectally daily as needed (constipation).    . Menthol, Topical Analgesic, (ICY HOT EX) Apply 1 application topically daily as needed (pain).    . metFORMIN (GLUCOPHAGE-XR) 500 MG 24 hr tablet TAKE 1 TABLET (500 MG TOTAL) BY MOUTH 2 (TWO) TIMES DAILY. 180 tablet 0  . mometasone (ELOCON) 0.1 % cream Apply topically daily. Apply to affected area as directed two times a day 45 g 0  . Multiple Vitamins-Minerals (CENTRUM SILVER PO) Take 1 tablet by mouth daily.     Marland Kitchen olmesartan (BENICAR) 40 MG tablet TAKE 1 TABLET (40 MG TOTAL) BY MOUTH DAILY. 90 tablet 0  . polyethylene glycol (MIRALAX / GLYCOLAX) packet Take 17 g by mouth daily as needed for  moderate constipation.    . TRADJENTA 5 MG TABS tablet TAKE 1 TABLET (5 MG TOTAL) BY MOUTH DAILY. (Patient taking differently: TAKE 1 TABLET (5 MG TOTAL) BY MOUTH DAILY WITH BREAKFAST) 30 tablet 2  . travoprost, benzalkonium, (TRAVATAN) 0.004 % ophthalmic solution Place 1 drop into both eyes at bedtime.     Marland Kitchen warfarin (COUMADIN) 3 MG tablet TAKE 1-1.5 TABLETS DAILY AT 6 PM. TAKE 1 TAB ON TUES, THURS, SAT, SUN. TAKE 4.5 MG ON MON, WED, FRI (Patient taking differently: TAKE 3MG  DAILY ON TUES, THURS, SAT, SUN. TAKE 4.5 MG DAILY ON MON, WED, FRI AT 6PM) 100 tablet 3   No current facility-administered medications for this visit.      Past Medical History:  Diagnosis Date  . 1st degree AV block    a. PR 400 msec with Apacing  .  Anemia   . Anxiety   . Arthritis    a. shoulders.  . Chest pain    a. 2004 Cath:  reportedly nl;  b. 09/2013 Neg MV.  . Chronic diastolic heart failure ([place])    a. 09/2013 Echo: EF 50-55%, mild LVH, mild to mod MR/TR.  Marland Kitchen Chronic kidney disease   . Congestive heart disease ([place])   . Esophageal reflux   . H/O hiatal hernia   . History of UTI   . Hyperlipidemia   . Morbid obesity ([place])   . OSA on CPAP   . PAF (paroxysmal atrial fibrillation) ([place])   . SSS (sick sinus syndrome) ([place])    a. 2008 s/p MDT PPM;  b. 11/2013 Gen change- MDT ADDRL1 Adapta DC PPM, ser # [id number] H.  . Symptomatic PVCs   . Syncope and collapse    a. Felt to be vasovagal.  . Thyroid disease    on no meds per pt  . Type II diabetes mellitus ([place])   . Unspecified essential hypertension   . Unspecified glaucoma(365.9)     ROS:   All systems reviewed and negative except as noted in the HPI.   Past Surgical History:  Procedure Laterality Date  . APPENDECTOMY    . CARDIOVERSION N/A 04/26/2017   Procedure: Cardioversion;  Surgeon: Minna Merritts, MD;  Location: [facility] ORS;  Service: Cardiovascular;  Laterality: N/A;  . DILATION AND CURETTAGE OF UTERUS    . INCISION AND DRAINAGE OF WOUND Right 03/1998   "leg; it was like a boil" (12/01/2013)  . INSERT / REPLACE / REMOVE PACEMAKER  2008; 12/01/2013   MDT ADDRL1 pacemaker - gen change by Dr Caryl Comes 12/01/2013  . LUNG BIOPSY  2010   [facility]  . PERMANENT PACEMAKER GENERATOR CHANGE N/A 12/01/2013   Procedure: PERMANENT PACEMAKER GENERATOR CHANGE;  Surgeon: Deboraha Sprang, MD;  Location: [facility] CATH LAB;  Service: Cardiovascular;  Laterality: N/A;  . VAGINAL HYSTERECTOMY  1970     Family History  Problem Relation Age of Onset  . Heart disease Father   . Rheum arthritis Father   . Allergies Father   . Liver cancer Mother   . Pancreatic cancer Mother   . Hypertension Sister   . Rheum arthritis Sister      Social History   Social History  . Marital status:  Widowed    Spouse name: N/A  . Number of children: Y  . Years of education: N/A   Occupational History  . retired    Social History Main Topics  . Smoking status: Former Smoker    Packs/day: 0.50    Years:  34.00    Types: Cigarettes    Quit date: 08/15/1989  . Smokeless tobacco: Never Used  . Alcohol use No  . Drug use: No  . Sexual activity: No   Other Topics Concern  . Not on file   Social History Narrative   Retired. Widowed. Regularly exercises.      BP 112/60   Pulse 65   Ht 5\' 4"  (1.626 m)   Wt 241 lb 6.4 oz (109.5 kg)   BMI 41.44 kg/m   Physical Exam:  obese appearing 82 yo woman NAD HEENT: Unremarkable Neck:  7 cm JVD, no thyromegally Lymphatics:  No adenopathy Back:  No CVA tenderness Lungs:  Clear with no wheezes HEART:  Regular rate rhythm, no murmurs, no rubs, no clicks Abd:  soft, positive bowel sounds, no organomegally, no rebound, no guarding Ext:  2 plus pulses, no edema, no cyanosis, no clubbing Skin:  No rashes no nodules Neuro:  CN II through XII intact, motor grossly intact   DEVICE  Normal device function.  See PaceArt for details.   Assess/Plan: 1. PAF - she will continue her current meds. We discussed the importance of taking her coumadin. 2. HTN - her blood pressure is ok today. She is encouraged to reduce her salt intake. 3. PPM - her medtronic DDD PM is working normally. Will follow. 4. Obesity - I have asked her to lose weight. She states she will try to.  Mikle Bosworth.D.

## 2017-06-14 LAB — CUP PACEART INCLINIC DEVICE CHECK
Battery Impedance: 157 Ohm
Battery Remaining Longevity: 127 mo
Battery Voltage: 2.79 V
Brady Statistic AP VP Percent: 0 %
Brady Statistic AP VS Percent: 99 %
Brady Statistic AS VP Percent: 0 %
Brady Statistic AS VS Percent: 1 %
Date Time Interrogation Session: 20180709145134
Implantable Lead Implant Date: 20080117
Implantable Lead Implant Date: 20080117
Implantable Lead Location: 753859
Implantable Lead Location: 753860
Implantable Lead Model: 5076
Implantable Lead Model: 5076
Implantable Pulse Generator Implant Date: 20141229
Lead Channel Impedance Value: 423 Ohm
Lead Channel Impedance Value: 583 Ohm
Lead Channel Pacing Threshold Amplitude: 0.75 V
Lead Channel Pacing Threshold Amplitude: 0.75 V
Lead Channel Pacing Threshold Amplitude: 1 V
Lead Channel Pacing Threshold Amplitude: 1.125 V
Lead Channel Pacing Threshold Pulse Width: 0.4 ms
Lead Channel Pacing Threshold Pulse Width: 0.4 ms
Lead Channel Pacing Threshold Pulse Width: 0.4 ms
Lead Channel Pacing Threshold Pulse Width: 0.4 ms
Lead Channel Sensing Intrinsic Amplitude: 11.2 mV
Lead Channel Setting Pacing Amplitude: 2 V
Lead Channel Setting Pacing Amplitude: 2.5 V
Lead Channel Setting Pacing Pulse Width: 0.4 ms
Lead Channel Setting Sensing Sensitivity: 4 mV

## 2017-06-26 LAB — CUP PACEART REMOTE DEVICE CHECK
Battery Impedance: 180 Ohm
Battery Remaining Longevity: 123 mo
Battery Voltage: 2.79 V
Brady Statistic AP VP Percent: 0 %
Brady Statistic AP VS Percent: 99 %
Brady Statistic AS VP Percent: 0 %
Brady Statistic AS VS Percent: 1 %
Date Time Interrogation Session: 20180627113051
Implantable Lead Implant Date: 20080117
Implantable Lead Implant Date: 20080117
Implantable Lead Location: 753859
Implantable Lead Location: 753860
Implantable Lead Model: 5076
Implantable Lead Model: 5076
Implantable Pulse Generator Implant Date: 20141229
Lead Channel Impedance Value: 423 Ohm
Lead Channel Impedance Value: 666 Ohm
Lead Channel Pacing Threshold Amplitude: 0.625 V
Lead Channel Pacing Threshold Amplitude: 1.125 V
Lead Channel Pacing Threshold Pulse Width: 0.4 ms
Lead Channel Pacing Threshold Pulse Width: 0.4 ms
Lead Channel Setting Pacing Amplitude: 2 V
Lead Channel Setting Pacing Amplitude: 2.5 V
Lead Channel Setting Pacing Pulse Width: 0.4 ms
Lead Channel Setting Sensing Sensitivity: 4 mV

## 2017-07-04 ENCOUNTER — Other Ambulatory Visit: Payer: Self-pay | Admitting: Family Medicine

## 2017-07-04 DIAGNOSIS — E7800 Pure hypercholesterolemia, unspecified: Secondary | ICD-10-CM

## 2017-07-18 ENCOUNTER — Other Ambulatory Visit: Payer: Self-pay | Admitting: Cardiovascular Disease

## 2017-08-16 ENCOUNTER — Other Ambulatory Visit: Payer: Self-pay | Admitting: Cardiovascular Disease

## 2017-08-27 ENCOUNTER — Telehealth: Payer: Self-pay | Admitting: Cardiology

## 2017-08-27 NOTE — Telephone Encounter (Signed)
New Message: ° ° ° ° °Please call. °

## 2017-08-28 NOTE — Telephone Encounter (Signed)
LMOVM for pt to return call 

## 2017-08-30 ENCOUNTER — Ambulatory Visit (INDEPENDENT_AMBULATORY_CARE_PROVIDER_SITE_OTHER): Payer: Medicare Other | Admitting: *Deleted

## 2017-08-30 ENCOUNTER — Telehealth: Payer: Self-pay | Admitting: *Deleted

## 2017-08-30 ENCOUNTER — Inpatient Hospital Stay: Payer: Medicare Other | Attending: Oncology

## 2017-08-30 ENCOUNTER — Other Ambulatory Visit: Payer: Self-pay | Admitting: Oncology

## 2017-08-30 ENCOUNTER — Encounter: Payer: Self-pay | Admitting: Oncology

## 2017-08-30 ENCOUNTER — Inpatient Hospital Stay (HOSPITAL_BASED_OUTPATIENT_CLINIC_OR_DEPARTMENT_OTHER): Payer: Medicare Other | Admitting: Oncology

## 2017-08-30 VITALS — BP 109/70 | Temp 99.0°F | Resp 16 | Wt 239.0 lb

## 2017-08-30 DIAGNOSIS — I495 Sick sinus syndrome: Secondary | ICD-10-CM | POA: Diagnosis not present

## 2017-08-30 DIAGNOSIS — I130 Hypertensive heart and chronic kidney disease with heart failure and stage 1 through stage 4 chronic kidney disease, or unspecified chronic kidney disease: Secondary | ICD-10-CM | POA: Insufficient documentation

## 2017-08-30 DIAGNOSIS — Z79899 Other long term (current) drug therapy: Secondary | ICD-10-CM | POA: Insufficient documentation

## 2017-08-30 DIAGNOSIS — E1122 Type 2 diabetes mellitus with diabetic chronic kidney disease: Secondary | ICD-10-CM | POA: Insufficient documentation

## 2017-08-30 DIAGNOSIS — Z7984 Long term (current) use of oral hypoglycemic drugs: Secondary | ICD-10-CM | POA: Insufficient documentation

## 2017-08-30 DIAGNOSIS — N183 Chronic kidney disease, stage 3 (moderate): Secondary | ICD-10-CM | POA: Insufficient documentation

## 2017-08-30 DIAGNOSIS — D509 Iron deficiency anemia, unspecified: Secondary | ICD-10-CM

## 2017-08-30 DIAGNOSIS — K219 Gastro-esophageal reflux disease without esophagitis: Secondary | ICD-10-CM | POA: Diagnosis not present

## 2017-08-30 DIAGNOSIS — I5032 Chronic diastolic (congestive) heart failure: Secondary | ICD-10-CM | POA: Insufficient documentation

## 2017-08-30 DIAGNOSIS — E079 Disorder of thyroid, unspecified: Secondary | ICD-10-CM | POA: Insufficient documentation

## 2017-08-30 DIAGNOSIS — F419 Anxiety disorder, unspecified: Secondary | ICD-10-CM | POA: Diagnosis not present

## 2017-08-30 DIAGNOSIS — Z7901 Long term (current) use of anticoagulants: Secondary | ICD-10-CM | POA: Diagnosis not present

## 2017-08-30 DIAGNOSIS — I440 Atrioventricular block, first degree: Secondary | ICD-10-CM | POA: Insufficient documentation

## 2017-08-30 DIAGNOSIS — G4733 Obstructive sleep apnea (adult) (pediatric): Secondary | ICD-10-CM | POA: Insufficient documentation

## 2017-08-30 DIAGNOSIS — I480 Paroxysmal atrial fibrillation: Secondary | ICD-10-CM | POA: Insufficient documentation

## 2017-08-30 DIAGNOSIS — D582 Other hemoglobinopathies: Secondary | ICD-10-CM | POA: Insufficient documentation

## 2017-08-30 DIAGNOSIS — M1990 Unspecified osteoarthritis, unspecified site: Secondary | ICD-10-CM | POA: Diagnosis not present

## 2017-08-30 DIAGNOSIS — H409 Unspecified glaucoma: Secondary | ICD-10-CM | POA: Diagnosis not present

## 2017-08-30 DIAGNOSIS — Z950 Presence of cardiac pacemaker: Secondary | ICD-10-CM | POA: Diagnosis not present

## 2017-08-30 DIAGNOSIS — Z87440 Personal history of urinary (tract) infections: Secondary | ICD-10-CM | POA: Diagnosis not present

## 2017-08-30 DIAGNOSIS — E785 Hyperlipidemia, unspecified: Secondary | ICD-10-CM | POA: Insufficient documentation

## 2017-08-30 DIAGNOSIS — Z87891 Personal history of nicotine dependence: Secondary | ICD-10-CM | POA: Diagnosis not present

## 2017-08-30 LAB — CUP PACEART REMOTE DEVICE CHECK
Battery Impedance: 180 Ohm
Battery Remaining Longevity: 124 mo
Battery Voltage: 2.79 V
Brady Statistic AP VP Percent: 0 %
Brady Statistic AP VS Percent: 100 %
Brady Statistic AS VP Percent: 0 %
Brady Statistic AS VS Percent: 0 %
Date Time Interrogation Session: 20180927125404
Implantable Lead Implant Date: 20080117
Implantable Lead Implant Date: 20080117
Implantable Lead Location: 753859
Implantable Lead Location: 753860
Implantable Lead Model: 5076
Implantable Lead Model: 5076
Implantable Pulse Generator Implant Date: 20141229
Lead Channel Impedance Value: 445 Ohm
Lead Channel Impedance Value: 680 Ohm
Lead Channel Pacing Threshold Amplitude: 0.625 V
Lead Channel Pacing Threshold Amplitude: 1 V
Lead Channel Pacing Threshold Pulse Width: 0.4 ms
Lead Channel Pacing Threshold Pulse Width: 0.4 ms
Lead Channel Setting Pacing Amplitude: 2 V
Lead Channel Setting Pacing Amplitude: 2.5 V
Lead Channel Setting Pacing Pulse Width: 0.4 ms
Lead Channel Setting Sensing Sensitivity: 4 mV

## 2017-08-30 LAB — CBC
HCT: 31.9 % — ABNORMAL LOW (ref 35.0–47.0)
Hemoglobin: 10.8 g/dL — ABNORMAL LOW (ref 12.0–16.0)
MCH: 24.6 pg — ABNORMAL LOW (ref 26.0–34.0)
MCHC: 33.9 g/dL (ref 32.0–36.0)
MCV: 72.4 fL — ABNORMAL LOW (ref 80.0–100.0)
Platelets: 220 10*3/uL (ref 150–440)
RBC: 4.4 MIL/uL (ref 3.80–5.20)
RDW: 16.2 % — ABNORMAL HIGH (ref 11.5–14.5)
WBC: 7.1 10*3/uL (ref 3.6–11.0)

## 2017-08-30 LAB — IRON AND TIBC
Iron: 59 ug/dL (ref 28–170)
Saturation Ratios: 19 % (ref 10.4–31.8)
TIBC: 316 ug/dL (ref 250–450)
UIBC: 257 ug/dL

## 2017-08-30 LAB — FERRITIN: Ferritin: 14 ng/mL (ref 11–307)

## 2017-08-30 NOTE — Telephone Encounter (Signed)
LMOVM informing pt that her remote transmission was received.

## 2017-08-30 NOTE — Progress Notes (Signed)
Hematology/Oncology Consult note [facility]  Telephone:([phone] [phone]  Patient Care Team: Dion Body, MD as PCP - General (Family Medicine) Minna Merritts, MD as Consulting Physician (Cardiology) Anthonette Legato, MD as Consulting Physician (Nephrology) Prusinski Lance, MD as Consulting Physician (Cardiology) Lorelee Cover., MD (Ophthalmology) Edrick Kins, DPM as Consulting Physician (Podiatry)   Name of the patient: Maleyah Piacentini  [id number]  1935-04-03   Date of visit: 08/30/17  Diagnosis- 1. Iron deficiency anemia 2. Hemoglobin C trait  Chief complaint/ Reason for visit- routine f/u of anemia  Heme/Onc history: patient is a 82 year old female with a past medical history significant for hypertension and stage III chronic kidney disease among other medical problems. She also has hypertension hyperlipidemia and type 2 diabetes. She was recently found by Dr. Clayton Bibles and was noted to have a hemoglobin of 9.4. Iron studies also revealed a ferritin of 22, iron saturation 13%. TIBC was normal at 302 on serum iron was low normal at 39 white count and platelets within normal limits. She has been referred to [place] for iron deficiency anemia. She has been seen by Dr. Allen Norris from GI the past and had a colonoscopy in 2017 which did not reveal any evidence of malignancy. 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 Last seen by Dr. Grayland Ormond in September 2017  Patient is taking oral iron once a day for over a month now and tolerating it without any significant side effects. She denies any consistent use of NSAIDs. Denies any bleeding and stools are docked artery stools. Denies any family history of colon cancer  Blood work from 05/24/2017 was as follows: CBC showed white count of 7.8, H&H of 10.2/30.2 with an MCV of 68.1 and a platelet count of 199.  Ferritin levels were low at 14. Vitamin B12 is worse normal at 947. Folate was normal. Haptoglobin and TSH was normal. Iron studies were within normal limits. ESR was mildly elevated at 56. Multiple myeloma panel did not reveal any monoclonal protein. H. pylori stool antigen was negative.  Interval history- she is doing well and denies any fatigue or other complaints. She is on oral iron once a day and tolerating it well  ECOG PS- 1 Pain scale- 0  Review of systems- Review of Systems  Constitutional: Negative for chills, fever, malaise/fatigue and weight loss.  HENT: Negative for congestion, ear discharge and nosebleeds.   Eyes: Negative for blurred vision.  Respiratory: Negative for cough, hemoptysis, sputum production, shortness of breath and wheezing.   Cardiovascular: Negative for chest pain, palpitations, orthopnea and claudication.  Gastrointestinal: Negative for abdominal pain, blood in stool, constipation, diarrhea, heartburn, melena, nausea and vomiting.  Genitourinary: Negative for dysuria, flank pain, frequency, hematuria and urgency.  Musculoskeletal: Negative for back pain, joint pain and myalgias.  Skin: Negative for rash.  Neurological: Negative for dizziness, tingling, focal weakness, seizures, weakness and headaches.  Endo/Heme/Allergies: Does not bruise/bleed easily.  Psychiatric/Behavioral: Negative for depression and suicidal ideas. The patient does not have insomnia.       Allergies  Allergen Reactions  . Metoprolol Other (See Comments)    Bad dreams Patient stated that she had suicidal thoughts while taking this medication.  . Diltiazem Other (See Comments)    Patient stated that she had bad dreams with this medication  . Levofloxacin Nausea And Vomiting     Past Medical History:  Diagnosis  Date  . 1st degree AV block    a. PR 400 msec with Apacing  . Anemia   . Anxiety   . Arthritis    a. shoulders.  . Chest pain    a. 2004 Cath:  reportedly nl;  b.  09/2013 Neg MV.  . Chronic diastolic heart failure ([place])    a. 09/2013 Echo: EF 50-55%, mild LVH, mild to mod MR/TR.  Marland Kitchen Chronic kidney disease   . Congestive heart disease ([place])   . Esophageal reflux   . H/O hiatal hernia   . History of UTI   . Hyperlipidemia   . Morbid obesity ([place])   . OSA on CPAP   . PAF (paroxysmal atrial fibrillation) ([place])   . SSS (sick sinus syndrome) ([place])    a. 2008 s/p MDT PPM;  b. 11/2013 Gen change- MDT ADDRL1 Adapta DC PPM, ser # [id number].  . Symptomatic PVCs   . Syncope and collapse    a. Felt to be vasovagal.  . Thyroid disease    on no meds per pt  . Type II diabetes mellitus ([place])   . Unspecified essential hypertension   . Unspecified glaucoma(365.9)      Past Surgical History:  Procedure Laterality Date  . APPENDECTOMY    . CARDIOVERSION N/A 04/26/2017   Procedure: Cardioversion;  Surgeon: Minna Merritts, MD;  Location: [facility] ORS;  Service: Cardiovascular;  Laterality: N/A;  . DILATION AND CURETTAGE OF UTERUS    . INCISION AND DRAINAGE OF WOUND Right 03/1998   "leg; it was like a boil" (12/01/2013)  . INSERT / REPLACE / REMOVE PACEMAKER  2008; 12/01/2013   MDT ADDRL1 pacemaker - gen change by Dr Caryl Comes 12/01/2013  . LUNG BIOPSY  2010   [facility]  . PERMANENT PACEMAKER GENERATOR CHANGE N/A 12/01/2013   Procedure: PERMANENT PACEMAKER GENERATOR CHANGE;  Surgeon: Deboraha Sprang, MD;  Location: [facility] CATH LAB;  Service: Cardiovascular;  Laterality: N/A;  . VAGINAL HYSTERECTOMY  1970    Social History   Social History  . Marital status: Widowed    Spouse name: N/A  . Number of children: Y  . Years of education: N/A   Occupational History  . retired    Social History Main Topics  . Smoking status: Former Smoker    Packs/day: 0.50    Years: 34.00    Types: Cigarettes    Quit date: 08/15/1989  . Smokeless tobacco: Never Used  . Alcohol use No  . Drug use: No  . Sexual activity: No   Other Topics Concern  . Not on file   Social  History Narrative   Retired. Widowed. Regularly exercises.     Family History  Problem Relation Age of Onset  . Heart disease Father   . Rheum arthritis Father   . Allergies Father   . Liver cancer Mother   . Pancreatic cancer Mother   . Hypertension Sister   . Rheum arthritis Sister      Current Outpatient Prescriptions:  .  ACCU-CHEK SOFTCLIX LANCETS lancets, as directed. Check CBG's fasting once daily. Dx: E11.22, N18.3, Disp: , Rfl:  .  acetaminophen (TYLENOL) 500 MG tablet, Take 1,000 mg by mouth every 6 (six) hours as needed for moderate pain. , Disp: , Rfl:  .  ALPRAZolam (XANAX) 0.5 MG tablet, Take 1 tablet (0.5 mg total) by mouth daily as needed., Disp: 90 tablet, Rfl: 0 .  amLODipine (NORVASC) 10 MG tablet, TAKE 1 TABLET (10 MG TOTAL) BY  MOUTH DAILY., Disp: 90 tablet, Rfl: 0 .  atorvastatin (LIPITOR) 20 MG tablet, TAKE 1 TABLET (20 MG TOTAL) BY MOUTH DAILY. (Patient taking differently: Take 20 mg by mouth at bedtime. ), Disp: 90 tablet, Rfl: 0 .  Brinzolamide-Brimonidine (SIMBRINZA) 1-0.2 % SUSP, Place 1 drop into both eyes 2 (two) times daily., Disp: 1 Bottle, Rfl: 1 .  carvedilol (COREG) 12.5 MG tablet, TAKE 1 TABLET BY MOUTH IN THE MORNING WITH BREAKFAST AND 2 TABLETS IN THE EVENING BEFORE BED, Disp: 90 tablet, Rfl: 0 .  colchicine 0.6 MG tablet, Take 1.82ms at first sign of gout flare, wait one hour then take 0.664mas needed for gout, Disp: , Rfl:  .  esomeprazole (NEXIUM) 40 MG capsule, TAKE 1 CAPSULE (40 MG TOTAL) BY MOUTH DAILY. (Patient taking differently: Take 40 mg by mouth daily before breakfast. ), Disp: 90 capsule, Rfl: 1 .  ferrous sulfate 325 (65 FE) MG EC tablet, Take 325 mg by mouth daily., Disp: , Rfl:  .  flecainide (TAMBOCOR) 100 MG tablet, Take 1 tablet (100 mg total) by mouth 2 (two) times daily., Disp: 60 tablet, Rfl: 6 .  glipiZIDE (GLUCOTROL) 10 MG tablet, TAKE 1 TABLET (10 MG TOTAL) BY MOUTH 2 (TWO) TIMES DAILY BEFORE A MEAL., Disp: 180 tablet, Rfl:  0 .  Glycerin, Laxative, (FLEET LIQUID GLYCERIN SUPP RE), Place 1 Dose rectally daily as needed (constipation)., Disp: , Rfl:  .  Menthol, Topical Analgesic, (ICY HOT EX), Apply 1 application topically daily as needed (pain)., Disp: , Rfl:  .  metFORMIN (GLUCOPHAGE-XR) 500 MG 24 hr tablet, TAKE 1 TABLET (500 MG TOTAL) BY MOUTH 2 (TWO) TIMES DAILY., Disp: 180 tablet, Rfl: 0 .  mometasone (ELOCON) 0.1 % cream, Apply topically daily. Apply to affected area as directed two times a day, Disp: 45 g, Rfl: 0 .  Multiple Vitamins-Minerals (CENTRUM SILVER PO), Take 1 tablet by mouth daily. , Disp: , Rfl:  .  olmesartan (BENICAR) 40 MG tablet, TAKE 1 TABLET (40 MG TOTAL) BY MOUTH DAILY., Disp: 90 tablet, Rfl: 0 .  polyethylene glycol (MIRALAX / GLYCOLAX) packet, Take 17 g by mouth daily as needed for moderate constipation., Disp: , Rfl:  .  TRADJENTA 5 MG TABS tablet, TAKE 1 TABLET (5 MG TOTAL) BY MOUTH DAILY. (Patient taking differently: TAKE 1 TABLET (5 MG TOTAL) BY MOUTH DAILY WITH BREAKFAST), Disp: 30 tablet, Rfl: 2 .  travoprost, benzalkonium, (TRAVATAN) 0.004 % ophthalmic solution, Place 1 drop into both eyes at bedtime. , Disp: , Rfl:  .  warfarin (COUMADIN) 3 MG tablet, TAKE 1-1.5 TABLETS DAILY AT 6 PM. TAKE 1 TAB ON TUES, THURS, SAT, SUN. TAKE 4.5 MG ON MON, WED, FRI (Patient taking differently: TAKE 3MG DAILY ON TUES, THURS, SAT, SUN. TAKE 4.5 MG DAILY ON MON, WED, FRI AT 6PM), Disp: 100 tablet, Rfl: 3 .  furosemide (LASIX) 20 MG tablet, Take 1 tablet (20 mg total) by mouth daily. (Patient taking differently: Take 40 mg by mouth daily. May take an additional 2062m6 hours after first dose as needed for swelling), Disp: 90 tablet, Rfl: 0  Physical exam:  Vitals:   08/30/17 1400  BP: 109/70  Resp: 16  Temp: 99 F (37.2 C)  TempSrc: Tympanic  Weight: 239 lb (108.4 kg)   Physical Exam  Constitutional: She is oriented to person, place, and time and well-developed, well-nourished, and in no  distress.  HENT:  Head: Normocephalic and atraumatic.  Eyes: Pupils are equal, round, and  reactive to light. EOM are normal.  Neck: Normal range of motion.  Cardiovascular: Normal rate, regular rhythm and normal heart sounds.   Pulmonary/Chest: Effort normal and breath sounds normal.  Abdominal: Soft. Bowel sounds are normal.  Neurological: She is alert and oriented to person, place, and time.  Skin: Skin is warm and dry.     CMP Latest Ref Rng & Units 04/18/2017  Glucose 65 - 99 mg/dL 207(H)  BUN 8 - 27 mg/dL 20  Creatinine 0.57 - 1.00 mg/dL 1.54(H)  Sodium 134 - 144 mmol/L 143  Potassium 3.5 - 5.2 mmol/L 3.7  Chloride 96 - 106 mmol/L 103  CO2 18 - 29 mmol/L 22  Calcium 8.7 - 10.3 mg/dL 8.9  Total Protein 6.5 - 8.1 g/dL -  Total Bilirubin 0.3 - 1.2 mg/dL -  Alkaline Phos 38 - 126 U/L -  AST 15 - 41 U/L -  ALT 14 - 54 U/L -   CBC Latest Ref Rng & Units 08/30/2017  WBC 3.6 - 11.0 K/uL 7.1  Hemoglobin 12.0 - 16.0 g/dL 10.8(L)  Hematocrit 35.0 - 47.0 % 31.9(L)  Platelets 150 - 440 K/uL 220    Assessment and plan- Patient is a 82 y.o. female with iron deficiency anemia and heterozygous for hemoglobin C disease  chronic persistent microcytosis suggestive of hemoglobin C rather than iron deficiency. Also pateints hb between 10-11 since 2013 and has been stable. Prior iron studies showed low ferritin but normal iron studies. I will await iron studies from today and if ferritin is still low, I will plan to give her 2 doses of feraheme 510 mg IV weekly. Discussed risks and benefits of ferriheme including all but not limited to fatigue, headache, neck swelling and risk of infusion reaction. Patient understands and agrees to proceed with Feraheme if need be. We will repeat CBC ferritin and iron studies in 3 months and 6 months and I will see her back in 6 months   Visit Diagnosis 1. Iron deficiency anemia, unspecified iron deficiency anemia type   2. Hemoglobinopathy [facility])      Dr.  Randa Evens, MD, MPH [facility] at [facility] Pager- [phone] 08/30/2017 2:21 PM

## 2017-08-30 NOTE — Addendum Note (Signed)
Addended by: Sindy Guadeloupe on: 08/30/2017 02:33 PM   Modules accepted: Orders

## 2017-08-30 NOTE — Progress Notes (Signed)
Remote pacemaker transmission.   

## 2017-08-30 NOTE — Telephone Encounter (Signed)
Called pt to let her know the ferritin is still low at 14, rec. 2 more dose of feraheme and pt agreeable.  I will send message to the sch. To get it set up and have them call ehr tom. She is agreeable to the plan,

## 2017-08-30 NOTE — Telephone Encounter (Signed)
-----   Message from Sindy Guadeloupe, MD sent at 08/30/2017  4:28 PM EDT ----- Ferritin is still low. Lets give 2 doses of feraheme as planned

## 2017-08-30 NOTE — Progress Notes (Signed)
Patient states that she has chronic arthritic pain in her right shoulder and knee. The only thing that seems to help is applying heat to these areas.

## 2017-08-31 ENCOUNTER — Telehealth: Payer: Self-pay | Admitting: Oncology

## 2017-08-31 NOTE — Telephone Encounter (Signed)
Feraheme x 2, per 08/30/17 schd msg/Sherry.  Scheduled and confirmed with patient for Friday 09/07/17 & 09/14/17.

## 2017-09-03 ENCOUNTER — Ambulatory Visit: Payer: Medicare Other

## 2017-09-03 ENCOUNTER — Ambulatory Visit: Payer: Medicare Other | Admitting: Family Medicine

## 2017-09-04 ENCOUNTER — Encounter: Payer: Self-pay | Admitting: Cardiology

## 2017-09-07 ENCOUNTER — Inpatient Hospital Stay: Payer: Medicare Other | Attending: Oncology

## 2017-09-07 VITALS — BP 116/72 | HR 60 | Temp 98.3°F | Resp 18

## 2017-09-07 DIAGNOSIS — Z7984 Long term (current) use of oral hypoglycemic drugs: Secondary | ICD-10-CM | POA: Diagnosis not present

## 2017-09-07 DIAGNOSIS — I5032 Chronic diastolic (congestive) heart failure: Secondary | ICD-10-CM | POA: Diagnosis not present

## 2017-09-07 DIAGNOSIS — Z87891 Personal history of nicotine dependence: Secondary | ICD-10-CM | POA: Insufficient documentation

## 2017-09-07 DIAGNOSIS — I480 Paroxysmal atrial fibrillation: Secondary | ICD-10-CM | POA: Insufficient documentation

## 2017-09-07 DIAGNOSIS — Z7901 Long term (current) use of anticoagulants: Secondary | ICD-10-CM | POA: Diagnosis not present

## 2017-09-07 DIAGNOSIS — E785 Hyperlipidemia, unspecified: Secondary | ICD-10-CM | POA: Diagnosis not present

## 2017-09-07 DIAGNOSIS — D582 Other hemoglobinopathies: Secondary | ICD-10-CM | POA: Insufficient documentation

## 2017-09-07 DIAGNOSIS — E079 Disorder of thyroid, unspecified: Secondary | ICD-10-CM | POA: Diagnosis not present

## 2017-09-07 DIAGNOSIS — I130 Hypertensive heart and chronic kidney disease with heart failure and stage 1 through stage 4 chronic kidney disease, or unspecified chronic kidney disease: Secondary | ICD-10-CM | POA: Diagnosis not present

## 2017-09-07 DIAGNOSIS — G4733 Obstructive sleep apnea (adult) (pediatric): Secondary | ICD-10-CM | POA: Diagnosis not present

## 2017-09-07 DIAGNOSIS — F419 Anxiety disorder, unspecified: Secondary | ICD-10-CM | POA: Insufficient documentation

## 2017-09-07 DIAGNOSIS — E1122 Type 2 diabetes mellitus with diabetic chronic kidney disease: Secondary | ICD-10-CM | POA: Diagnosis not present

## 2017-09-07 DIAGNOSIS — I440 Atrioventricular block, first degree: Secondary | ICD-10-CM | POA: Diagnosis not present

## 2017-09-07 DIAGNOSIS — Z950 Presence of cardiac pacemaker: Secondary | ICD-10-CM | POA: Insufficient documentation

## 2017-09-07 DIAGNOSIS — N183 Chronic kidney disease, stage 3 (moderate): Secondary | ICD-10-CM | POA: Insufficient documentation

## 2017-09-07 DIAGNOSIS — M1990 Unspecified osteoarthritis, unspecified site: Secondary | ICD-10-CM | POA: Insufficient documentation

## 2017-09-07 DIAGNOSIS — Z87440 Personal history of urinary (tract) infections: Secondary | ICD-10-CM | POA: Insufficient documentation

## 2017-09-07 DIAGNOSIS — H409 Unspecified glaucoma: Secondary | ICD-10-CM | POA: Insufficient documentation

## 2017-09-07 DIAGNOSIS — D509 Iron deficiency anemia, unspecified: Secondary | ICD-10-CM

## 2017-09-07 DIAGNOSIS — K219 Gastro-esophageal reflux disease without esophagitis: Secondary | ICD-10-CM | POA: Insufficient documentation

## 2017-09-07 DIAGNOSIS — Z79899 Other long term (current) drug therapy: Secondary | ICD-10-CM | POA: Insufficient documentation

## 2017-09-07 MED ORDER — SODIUM CHLORIDE 0.9 % IV SOLN
510.0000 mg | Freq: Once | INTRAVENOUS | Status: AC
  Administered 2017-09-07: 510 mg via INTRAVENOUS
  Filled 2017-09-07: qty 17

## 2017-09-07 MED ORDER — SODIUM CHLORIDE 0.9 % IV SOLN
Freq: Once | INTRAVENOUS | Status: AC
  Administered 2017-09-07: 14:00:00 via INTRAVENOUS
  Filled 2017-09-07: qty 1000

## 2017-09-14 ENCOUNTER — Inpatient Hospital Stay: Payer: Medicare Other

## 2017-09-14 VITALS — BP 116/68 | HR 60

## 2017-09-14 DIAGNOSIS — D509 Iron deficiency anemia, unspecified: Secondary | ICD-10-CM

## 2017-09-14 MED ORDER — SODIUM CHLORIDE 0.9 % IV SOLN
510.0000 mg | Freq: Once | INTRAVENOUS | Status: AC
  Administered 2017-09-14: 510 mg via INTRAVENOUS
  Filled 2017-09-14: qty 17

## 2017-09-14 MED ORDER — SODIUM CHLORIDE 0.9 % IV SOLN
Freq: Once | INTRAVENOUS | Status: AC
  Administered 2017-09-14: 14:00:00 via INTRAVENOUS
  Filled 2017-09-14: qty 1000

## 2017-09-18 ENCOUNTER — Other Ambulatory Visit: Payer: Self-pay | Admitting: Cardiovascular Disease

## 2017-09-25 ENCOUNTER — Other Ambulatory Visit: Payer: Self-pay | Admitting: "Endocrinology

## 2017-09-25 DIAGNOSIS — E042 Nontoxic multinodular goiter: Secondary | ICD-10-CM

## 2017-09-28 ENCOUNTER — Ambulatory Visit
Admission: RE | Admit: 2017-09-28 | Discharge: 2017-09-28 | Disposition: A | Discharge status: Home / Self Care | Payer: Medicare Other | Source: Ambulatory Visit | Attending: "Endocrinology | Admitting: "Endocrinology

## 2017-09-28 DIAGNOSIS — E042 Nontoxic multinodular goiter: Secondary | ICD-10-CM | POA: Insufficient documentation

## 2017-10-05 ENCOUNTER — Emergency Department: Payer: Medicare Other

## 2017-10-05 ENCOUNTER — Emergency Department
Admission: EM | Admit: 2017-10-05 | Discharge: 2017-10-05 | Disposition: A | Discharge status: Home / Self Care | Payer: Medicare Other | Attending: Emergency Medicine | Admitting: Emergency Medicine

## 2017-10-05 DIAGNOSIS — R51 Headache: Secondary | ICD-10-CM | POA: Diagnosis present

## 2017-10-05 DIAGNOSIS — Z7984 Long term (current) use of oral hypoglycemic drugs: Secondary | ICD-10-CM | POA: Diagnosis not present

## 2017-10-05 DIAGNOSIS — E1122 Type 2 diabetes mellitus with diabetic chronic kidney disease: Secondary | ICD-10-CM | POA: Insufficient documentation

## 2017-10-05 DIAGNOSIS — Z87891 Personal history of nicotine dependence: Secondary | ICD-10-CM | POA: Insufficient documentation

## 2017-10-05 DIAGNOSIS — I130 Hypertensive heart and chronic kidney disease with heart failure and stage 1 through stage 4 chronic kidney disease, or unspecified chronic kidney disease: Secondary | ICD-10-CM | POA: Insufficient documentation

## 2017-10-05 DIAGNOSIS — I5032 Chronic diastolic (congestive) heart failure: Secondary | ICD-10-CM | POA: Diagnosis not present

## 2017-10-05 DIAGNOSIS — N183 Chronic kidney disease, stage 3 (moderate): Secondary | ICD-10-CM | POA: Diagnosis not present

## 2017-10-05 DIAGNOSIS — Z7901 Long term (current) use of anticoagulants: Secondary | ICD-10-CM | POA: Diagnosis not present

## 2017-10-05 DIAGNOSIS — R519 Headache, unspecified: Secondary | ICD-10-CM

## 2017-10-05 LAB — URINE DRUG SCREEN, QUALITATIVE (ARMC ONLY)
Amphetamines, Ur Screen: NOT DETECTED
Barbiturates, Ur Screen: NOT DETECTED
Benzodiazepine, Ur Scrn: NOT DETECTED
Cannabinoid 50 Ng, Ur ~~LOC~~: NOT DETECTED
Cocaine Metabolite,Ur ~~LOC~~: NOT DETECTED
MDMA (Ecstasy)Ur Screen: NOT DETECTED
Methadone Scn, Ur: NOT DETECTED
Opiate, Ur Screen: NOT DETECTED
Phencyclidine (PCP) Ur S: NOT DETECTED
Tricyclic, Ur Screen: NOT DETECTED

## 2017-10-05 LAB — COMPREHENSIVE METABOLIC PANEL
ALT: 20 U/L (ref 14–54)
AST: 23 U/L (ref 15–41)
Albumin: 3.6 g/dL (ref 3.5–5.0)
Alkaline Phosphatase: 70 U/L (ref 38–126)
Anion gap: 7 (ref 5–15)
BUN: 17 mg/dL (ref 6–20)
CO2: 26 mmol/L (ref 22–32)
Calcium: 9.6 mg/dL (ref 8.9–10.3)
Chloride: 107 mmol/L (ref 101–111)
Creatinine, Ser: 1.41 mg/dL — ABNORMAL HIGH (ref 0.44–1.00)
GFR calc Af Amer: 40 mL/min — ABNORMAL LOW (ref >60)
GFR calc non Af Amer: 34 mL/min — ABNORMAL LOW (ref >60)
Glucose, Bld: 97 mg/dL (ref 65–99)
Potassium: 3.6 mmol/L (ref 3.5–5.1)
Sodium: 140 mmol/L (ref 135–145)
Total Bilirubin: 0.9 mg/dL (ref 0.3–1.2)
Total Protein: 7.1 g/dL (ref 6.5–8.1)

## 2017-10-05 LAB — URINALYSIS, ROUTINE W REFLEX MICROSCOPIC
Bilirubin Urine: NEGATIVE
Glucose, UA: NEGATIVE mg/dL
Hgb urine dipstick: NEGATIVE
Ketones, ur: NEGATIVE mg/dL
Leukocytes, UA: NEGATIVE
Nitrite: NEGATIVE
Protein, ur: NEGATIVE mg/dL
Specific Gravity, Urine: 1.004 — ABNORMAL LOW (ref 1.005–1.030)
pH: 7 (ref 5.0–8.0)

## 2017-10-05 LAB — CBC
HCT: 33.7 % — ABNORMAL LOW (ref 35.0–47.0)
Hemoglobin: 11.3 g/dL — ABNORMAL LOW (ref 12.0–16.0)
MCH: 25.4 pg — ABNORMAL LOW (ref 26.0–34.0)
MCHC: 33.6 g/dL (ref 32.0–36.0)
MCV: 75.7 fL — ABNORMAL LOW (ref 80.0–100.0)
Platelets: 179 10*3/uL (ref 150–440)
RBC: 4.45 MIL/uL (ref 3.80–5.20)
RDW: 17 % — ABNORMAL HIGH (ref 11.5–14.5)
WBC: 7.3 10*3/uL (ref 3.6–11.0)

## 2017-10-05 LAB — DIFFERENTIAL
Basophils Absolute: 0 10*3/uL (ref 0–0.1)
Basophils Relative: 0 %
Eosinophils Absolute: 0.2 10*3/uL (ref 0–0.7)
Eosinophils Relative: 3 %
Lymphocytes Relative: 19 %
Lymphs Abs: 1.4 10*3/uL (ref 1.0–3.6)
Monocytes Absolute: 0.7 10*3/uL (ref 0.2–0.9)
Monocytes Relative: 9 %
Neutro Abs: 5 10*3/uL (ref 1.4–6.5)
Neutrophils Relative %: 69 %

## 2017-10-05 LAB — ETHANOL: Alcohol, Ethyl (B): <10 mg/dL (ref <10)

## 2017-10-05 LAB — PROTIME-INR
INR: 1.92
Prothrombin Time: 21.8 seconds — ABNORMAL HIGH (ref 11.4–15.2)

## 2017-10-05 LAB — GLUCOSE, CAPILLARY: Glucose-Capillary: 89 mg/dL (ref 65–99)

## 2017-10-05 LAB — TROPONIN I: Troponin I: <0.03 ng/mL (ref <0.03)

## 2017-10-05 LAB — APTT: aPTT: 41 seconds — ABNORMAL HIGH (ref 24–36)

## 2017-10-05 MED ORDER — DIPHENHYDRAMINE HCL 50 MG/ML IJ SOLN
12.5000 mg | Freq: Once | INTRAMUSCULAR | Status: DC
  Filled 2017-10-05: qty 1

## 2017-10-05 MED ORDER — PROCHLORPERAZINE EDISYLATE 5 MG/ML IJ SOLN
5.0000 mg | Freq: Once | INTRAMUSCULAR | Status: AC
  Administered 2017-10-05: 5 mg via INTRAVENOUS
  Filled 2017-10-05: qty 2

## 2017-10-05 MED ORDER — IOPAMIDOL (ISOVUE-370) INJECTION 76%
60.0000 mL | Freq: Once | INTRAVENOUS | Status: AC | PRN
  Administered 2017-10-05: 60 mL via INTRAVENOUS

## 2017-10-05 MED ORDER — BUTALBITAL-APAP-CAFFEINE 50-325-40 MG PO TABS
1.0000 | ORAL_TABLET | Freq: Four times a day (QID) | ORAL | 0 refills | Status: DC | PRN
Start: 2017-10-05 — End: 2017-12-10

## 2017-10-05 MED ORDER — KETOROLAC TROMETHAMINE 30 MG/ML IJ SOLN
10.0000 mg | Freq: Once | INTRAMUSCULAR | Status: AC
  Administered 2017-10-05: 06:00:00 via INTRAVENOUS
  Filled 2017-10-05: qty 1

## 2017-10-05 NOTE — ED Triage Notes (Signed)
Patient c/o chest pain and worst headache of her life. Patient has decreased sensation to right arm, weakened grip right hand and aphasia.

## 2017-10-05 NOTE — ED Notes (Signed)
Patient transported to CT 

## 2017-10-05 NOTE — Discharge Instructions (Signed)
Please take your headache medication as prescribed and make an appointment to establish care with neurology as needed.  Please do make sure you make an appointment to follow-up with your primary care physician this coming Monday for reevaluation and return to the emergency department sooner for any concerns whatsoever.  It was a pleasure to take care of you today, and thank you for coming to our emergency department.  If you have any questions or concerns before leaving please ask the nurse to grab me and I'm more than happy to go through your aftercare instructions again.  If you were prescribed any opioid pain medication today such as Norco, Vicodin, Percocet, morphine, hydrocodone, or oxycodone please make sure you do not drive when you are taking this medication as it can alter your ability to drive safely.  If you have any concerns once you are home that you are not improving or are in fact getting worse before you can make it to your follow-up appointment, please do not hesitate to call 911 and come back for further evaluation.  Darel Hong, MD  Results for orders placed or performed during the hospital encounter of 10/05/17  Protime-INR  Result Value Ref Range   Prothrombin Time 21.8 (H) 11.4 - 15.2 seconds   INR 1.92   APTT  Result Value Ref Range   aPTT 41 (H) 24 - 36 seconds  CBC  Result Value Ref Range   WBC 7.3 3.6 - 11.0 K/uL   RBC 4.45 3.80 - 5.20 MIL/uL   Hemoglobin 11.3 (L) 12.0 - 16.0 g/dL   HCT 33.7 (L) 35.0 - 47.0 %   MCV 75.7 (L) 80.0 - 100.0 fL   MCH 25.4 (L) 26.0 - 34.0 pg   MCHC 33.6 32.0 - 36.0 g/dL   RDW 17.0 (H) 11.5 - 14.5 %   Platelets 179 150 - 440 K/uL  Differential  Result Value Ref Range   Neutrophils Relative % 69 %   Neutro Abs 5.0 1.4 - 6.5 K/uL   Lymphocytes Relative 19 %   Lymphs Abs 1.4 1.0 - 3.6 K/uL   Monocytes Relative 9 %   Monocytes Absolute 0.7 0.2 - 0.9 K/uL   Eosinophils Relative 3 %   Eosinophils Absolute 0.2 0 - 0.7 K/uL   Basophils Relative 0 %   Basophils Absolute 0.0 0 - 0.1 K/uL  Comprehensive metabolic panel  Result Value Ref Range   Sodium 140 135 - 145 mmol/L   Potassium 3.6 3.5 - 5.1 mmol/L   Chloride 107 101 - 111 mmol/L   CO2 26 22 - 32 mmol/L   Glucose, Bld 97 65 - 99 mg/dL   BUN 17 6 - 20 mg/dL   Creatinine, Ser 1.41 (H) 0.44 - 1.00 mg/dL   Calcium 9.6 8.9 - 10.3 mg/dL   Total Protein 7.1 6.5 - 8.1 g/dL   Albumin 3.6 3.5 - 5.0 g/dL   AST 23 15 - 41 U/L   ALT 20 14 - 54 U/L   Alkaline Phosphatase 70 38 - 126 U/L   Total Bilirubin 0.9 0.3 - 1.2 mg/dL   GFR calc non Af Amer 34 (L) >60 mL/min   GFR calc Af Amer 40 (L) >60 mL/min   Anion gap 7 5 - 15  Troponin I  Result Value Ref Range   Troponin I <0.03 <0.03 ng/mL  Ethanol  Result Value Ref Range   Alcohol, Ethyl (B) <10 <10 mg/dL  Urine Drug Screen, Qualitative  Result Value Ref  Range   Tricyclic, Ur Screen NONE DETECTED NONE DETECTED   Amphetamines, Ur Screen NONE DETECTED NONE DETECTED   MDMA (Ecstasy)Ur Screen NONE DETECTED NONE DETECTED   Cocaine Metabolite,Ur [place] NONE DETECTED NONE DETECTED   Opiate, Ur Screen NONE DETECTED NONE DETECTED   Phencyclidine (PCP) Ur S NONE DETECTED NONE DETECTED   Cannabinoid 50 Ng, Ur [place] NONE DETECTED NONE DETECTED   Barbiturates, Ur Screen NONE DETECTED NONE DETECTED   Benzodiazepine, Ur Scrn NONE DETECTED NONE DETECTED   Methadone Scn, Ur NONE DETECTED NONE DETECTED  Urinalysis, Routine w reflex microscopic  Result Value Ref Range   Color, Urine STRAW (A) YELLOW   APPearance CLEAR (A) CLEAR   Specific Gravity, Urine 1.004 (L) 1.005 - 1.030   pH 7.0 5.0 - 8.0   Glucose, UA NEGATIVE NEGATIVE mg/dL   Hgb urine dipstick NEGATIVE NEGATIVE   Bilirubin Urine NEGATIVE NEGATIVE   Ketones, ur NEGATIVE NEGATIVE mg/dL   Protein, ur NEGATIVE NEGATIVE mg/dL   Nitrite NEGATIVE NEGATIVE   Leukocytes, UA NEGATIVE NEGATIVE  Glucose, capillary  Result Value Ref Range   Glucose-Capillary 89 65 - 99 mg/dL     Ct Angio Head W/cm &/or Wo Cm  Result Date: 10/05/2017 CLINICAL DATA:  Chest pain and thunderclap headache EXAM: CT ANGIOGRAPHY HEAD AND NECK TECHNIQUE: Multidetector CT imaging of the head and neck was performed using the standard protocol during bolus administration of intravenous contrast. Multiplanar CT image reconstructions and MIPs were obtained to evaluate the vascular anatomy. Carotid stenosis measurements (when applicable) are obtained utilizing NASCET criteria, using the distal internal carotid diameter as the denominator. CONTRAST:  60 mL Isovue 370 COMPARISON:  Head CT 10/05/2017 FINDINGS: CTA NECK FINDINGS Aortic arch: There is minimal calcific atherosclerosis of the aortic arch. There is no aneurysm, dissection or hemodynamically significant stenosis of the visualized ascending aorta and aortic arch. There is a normal variant aortic arch branching pattern with the brachiocephalic and left common carotid arteries sharing a common origin. The visualized proximal subclavian arteries are normal. Right carotid system: The right common carotid origin is widely patent. There is no common carotid or internal carotid artery dissection or aneurysm. Atherosclerotic calcification at the carotid bifurcation without hemodynamically significant stenosis. Left carotid system: The left common carotid origin is widely patent. There is no common carotid or internal carotid artery dissection or aneurysm. Atherosclerotic calcification at the carotid bifurcation without hemodynamically significant stenosis. Vertebral arteries: The vertebral system is left dominant. Both vertebral artery origins are normal. Both vertebral arteries are normal to their confluence with the basilar artery. Skeleton: There is no bony spinal canal stenosis. No lytic or blastic lesions. Other neck: 2.5 cm hypodense nodule in the left thyroid. The thyroid is generally enlarged. Upper chest: No pneumothorax or pleural effusion. No nodules or  masses. Review of the MIP images confirms the above findings CTA HEAD FINDINGS Anterior circulation: --Intracranial internal carotid arteries: Normal. --Anterior cerebral arteries: Normal. --Middle cerebral arteries: Normal. --Posterior communicating arteries: Absent bilaterally. Posterior circulation: --Posterior cerebral arteries: Normal. --Superior cerebellar arteries: Normal. --Basilar artery: Normal. --Anterior inferior cerebellar arteries: Normal. --Posterior inferior cerebellar arteries: Normal. Venous sinuses: As permitted by contrast timing, patent. Anatomic variants: None Delayed phase: Not performed. Review of the MIP images confirms the above findings IMPRESSION: 1. No emergent large vessel occlusion or high-grade stenosis of the intracranial arteries. 2. No aneurysm or intracranial hemorrhage. 3. Bilateral carotid bifurcation mild atherosclerotic calcification without hemodynamically significant stenosis. 4.  Aortic Atherosclerosis (ICD10-I70.0). Electronically Signed  By: Ulyses Jarred M.D.   On: 10/05/2017 05:17   Ct Angio Neck W And/or Wo Contrast  Result Date: 10/05/2017 CLINICAL DATA:  Chest pain and thunderclap headache EXAM: CT ANGIOGRAPHY HEAD AND NECK TECHNIQUE: Multidetector CT imaging of the head and neck was performed using the standard protocol during bolus administration of intravenous contrast. Multiplanar CT image reconstructions and MIPs were obtained to evaluate the vascular anatomy. Carotid stenosis measurements (when applicable) are obtained utilizing NASCET criteria, using the distal internal carotid diameter as the denominator. CONTRAST:  60 mL Isovue 370 COMPARISON:  Head CT 10/05/2017 FINDINGS: CTA NECK FINDINGS Aortic arch: There is minimal calcific atherosclerosis of the aortic arch. There is no aneurysm, dissection or hemodynamically significant stenosis of the visualized ascending aorta and aortic arch. There is a normal variant aortic arch branching pattern with the  brachiocephalic and left common carotid arteries sharing a common origin. The visualized proximal subclavian arteries are normal. Right carotid system: The right common carotid origin is widely patent. There is no common carotid or internal carotid artery dissection or aneurysm. Atherosclerotic calcification at the carotid bifurcation without hemodynamically significant stenosis. Left carotid system: The left common carotid origin is widely patent. There is no common carotid or internal carotid artery dissection or aneurysm. Atherosclerotic calcification at the carotid bifurcation without hemodynamically significant stenosis. Vertebral arteries: The vertebral system is left dominant. Both vertebral artery origins are normal. Both vertebral arteries are normal to their confluence with the basilar artery. Skeleton: There is no bony spinal canal stenosis. No lytic or blastic lesions. Other neck: 2.5 cm hypodense nodule in the left thyroid. The thyroid is generally enlarged. Upper chest: No pneumothorax or pleural effusion. No nodules or masses. Review of the MIP images confirms the above findings CTA HEAD FINDINGS Anterior circulation: --Intracranial internal carotid arteries: Normal. --Anterior cerebral arteries: Normal. --Middle cerebral arteries: Normal. --Posterior communicating arteries: Absent bilaterally. Posterior circulation: --Posterior cerebral arteries: Normal. --Superior cerebellar arteries: Normal. --Basilar artery: Normal. --Anterior inferior cerebellar arteries: Normal. --Posterior inferior cerebellar arteries: Normal. Venous sinuses: As permitted by contrast timing, patent. Anatomic variants: None Delayed phase: Not performed. Review of the MIP images confirms the above findings IMPRESSION: 1. No emergent large vessel occlusion or high-grade stenosis of the intracranial arteries. 2. No aneurysm or intracranial hemorrhage. 3. Bilateral carotid bifurcation mild atherosclerotic calcification without  hemodynamically significant stenosis. 4.  Aortic Atherosclerosis (ICD10-I70.0). Electronically Signed   By: Ulyses Jarred M.D.   On: 10/05/2017 05:17   US Thyroid  Result Date: 09/28/2017 CLINICAL DATA:  Prior ultrasound follow-up. History of multinodular thyroid goiter. EXAM: THYROID ULTRASOUND TECHNIQUE: Ultrasound examination of the thyroid gland and adjacent soft tissues was performed. COMPARISON:  12/29/2016 and 12/18/2005 FINDINGS: Parenchymal Echotexture: Moderately heterogenous Isthmus: 1.0 cm Right lobe: 6.1 x 3.6 x 2.9 cm Left lobe: 6.3 x 3.1 x 2.4 cm _________________________________________________________ Estimated total number of nodules >/= 1 cm: 6-10 Number of spongiform nodules >/=  2 cm not described below (TR1): 0 Number of mixed cystic and solid nodules >/= 1.5 cm not described below (TR2): 0 _________________________________________________________ Nodule # 1: Prior biopsy: No Location: Isthmus; juncture of medial right lobe with isthmus Maximum size: 1.8 cm; Other 2 dimensions: 1.0 x 1.5 cm, previously, 1.7 cm Composition: spongiform (0) Echogenicity: anechoic (0) Shape: not taller-than-wide (0) Margins: smooth (0) Echogenic foci: none (0) ACR TI-RADS total points: 0. ACR TI-RADS risk category:  TR1 (0-1 points). Significant change in size (>/= 20% in two dimensions and minimal increase of 2 mm):  No Change in features: No Change in ACR TI-RADS risk category: No ACR TI-RADS recommendations: This nodule does NOT meet TI-RADS criteria for biopsy or dedicated follow-up. _________________________________________________________ Nodule # 2: Prior biopsy: No Location: Right; Inferior Maximum size: 1.6 cm; Other 2 dimensions: 1.0 x 1.1 cm, previously, 1.6 cm Composition: spongiform (0) Echogenicity: anechoic (0) Shape: not taller-than-wide (0) Margins: smooth (0) Echogenic foci: none (0) ACR TI-RADS total points: 0. ACR TI-RADS risk category:  TR1 (0-1 points). Significant change in size (>/= 20%  in two dimensions and minimal increase of 2 mm): No Change in features: No Change in ACR TI-RADS risk category: No ACR TI-RADS recommendations: This nodule does NOT meet TI-RADS criteria for biopsy or dedicated follow-up. _________________________________________________________ Nodule # 3: Prior biopsy: No Location: Left; Mid Maximum size: 1.7 cm; Other 2 dimensions: 0.9 x 1.4 cm, previously, 1.9 cm Composition: solid/almost completely solid (2) Echogenicity: isoechoic (1) Shape: not taller-than-wide (0) Margins: ill-defined (0) Echogenic foci: none (0) ACR TI-RADS total points: 3. ACR TI-RADS risk category:  TR3 (3 points). Significant change in size (>/= 20% in two dimensions and minimal increase of 2 mm): No Change in features: No Change in ACR TI-RADS risk category: No ACR TI-RADS recommendations: *Given size (>/= 1.5 - 2.4 cm) and appearance, a follow-up ultrasound in 1 year should be considered based on TI-RADS criteria. _________________________________________________________ Nodule # 4: Prior biopsy: No Location: Left; Inferior Maximum size: 1.4 cm; Other 2 dimensions: 0.9 x 0.8 cm, previously, 1.6 cm Composition: solid/almost completely solid (2) Echogenicity: hypoechoic (2) Shape: not taller-than-wide (0) Margins: smooth (0) Echogenic foci: none (0) ACR TI-RADS total points: 4. ACR TI-RADS risk category: TR4 (4-6 points) Significant change in size (>/= 20% in two dimensions and minimal increase of 2 mm): No Change in features: No Change in ACR TI-RADS risk category: No ACR TI-RADS recommendations: *Given size (>/= 1 - 1.4 cm) and appearance, a follow-up ultrasound in 1 year should be considered based on TI-RADS criteria. _________________________________________________________ 2.6 cm simple cyst in the inferior left lobe may be marginally larger and measured 2.3 cm previously. This has the appearance of a benign cyst. Additional 1.3 cm spongiform isthmus nodule is stable and benign in appearance. 1 cm  solid and cystic nodule in the mid right lobe is stable and appears benign. IMPRESSION: Multinodular thyroid goiter with no significant change in nodules and cysts. No nodules meet criteria for biopsy. Only a 1.7 cm mid left and 1.4 cm inferior left nodule meet criteria for 1 year follow-up ultrasound. All of the nodules do not meet criteria for further dedicated follow-up. The above is in keeping with the ACR TI-RADS recommendations - J Am Coll Radiol 2017;14:[id number]. Electronically Signed   By: Aletta Edouard M.D.   On: 09/28/2017 16:18   Ct Head Code Stroke Wo Contrast`  Result Date: 10/05/2017 CLINICAL DATA:  Code stroke.  Thunderclap headache EXAM: CT HEAD WITHOUT CONTRAST TECHNIQUE: Contiguous axial images were obtained from the base of the skull through the vertex without intravenous contrast. COMPARISON:  Head CT 07/31/2012 FINDINGS: Brain: No mass lesion or acute hemorrhage. No focal hypoattenuation of the basal ganglia or cortex to indicate infarcted tissue. There is periventricular hypoattenuation compatible with chronic microvascular disease. Vascular: No hyperdense vessel. No advanced atherosclerotic calcification of the arteries at the skull base. Skull: Normal visualized skull base, calvarium and extracranial soft tissues. Sinuses/Orbits: No sinus fluid levels or advanced mucosal thickening. No mastoid effusion. Normal orbits. ASPECTS [facility] Stroke Program Early CT Score) - Ganglionic  level infarction (caudate, lentiform nuclei, internal capsule, insula, M1-M3 cortex): 7 - Supraganglionic infarction (M4-M6 cortex): 3 Total score (0-10 with 10 being normal): 10 IMPRESSION: 1. No acute hemorrhage or mass lesion. 2. Chronic microvascular disease related white matter changes without acute abnormality. 3. ASPECTS is 10. These results were called by telephone at the time of interpretation on 10/05/2017 at 3:01 am to Dr. Darel Hong , who verbally acknowledged these results. Electronically Signed    By: Ulyses Jarred M.D.   On: 10/05/2017 03:01

## 2017-10-05 NOTE — ED Notes (Signed)
On monitor with tele neuro

## 2017-10-05 NOTE — ED Notes (Signed)
Pt up and dressed without any assistance. Feels good. Gait is steady. Speech is clear. Grips are excellent. D/C home. Verbalizes understanding of paln

## 2017-10-05 NOTE — Progress Notes (Signed)
[place] received a Code Stroke page for this pt who complaining of a pounding headache on right side of her head. Medical team assessing the pt. [place] contacted son but he was not answering the phone. [place] contacted pt.'s son and he called back and talked to his mother. Son works 1st shift and she doesn't want him to come and see her tonight, and the other son is at work as he works 3rd shift. Pt was alert and talking. [place] provide ministry of pastoral care and presence.    10/05/17 0500  Clinical Encounter Type  Visited With Patient;Health care provider  Visit Type Initial;Follow-up;Other (Comment)  Referral From Nurse  Consult/Referral To [place] (Comment)

## 2017-10-05 NOTE — ED Provider Notes (Signed)
[facility] Emergency Department Provider Note  ____________________________________________   First MD Initiated Contact with Patient 10/05/17 [phone]     (approximate)  I have reviewed the triage vital signs and the nursing notes.   HISTORY  Chief Complaint Code Stroke and Chest Pain   HPI [name] [name] is a 82 y.o. female who self presents to the emergency department with severe right sided headache that awoke her from sleep at roughly 130 this morning.  She went to sleep last night around 1030 feeling well.  She has never had a headache like this before and she describes it as feeling waves of rushing pain along the right lateral aspect of her head.  She feels like she may have had some slurred speech briefly and some tingling in her right arm and right leg.  She was worried she might be having atrial fibrillation.  She has a history of A. fib for which she takes Coumadin and has a pacemaker.  Her symptoms began suddenly have been constant nothing seems to make it better or worse.  Past Medical History:  Diagnosis Date  . 1st degree AV block    a. PR 400 msec with Apacing  . Anemia   . Anxiety   . Arthritis    a. shoulders.  . Chest pain    a. 2004 Cath:  reportedly nl;  b. 09/2013 Neg MV.  . Chronic diastolic heart failure ([place])    a. 09/2013 Echo: EF 50-55%, mild LVH, mild to mod MR/TR.  Marland Kitchen Chronic kidney disease   . Congestive heart disease ([place])   . Esophageal reflux   . H/O hiatal hernia   . History of UTI   . Hyperlipidemia   . Morbid obesity ([place])   . OSA on CPAP   . PAF (paroxysmal atrial fibrillation) ([place])   . SSS (sick sinus syndrome) ([place])    a. 2008 s/p MDT PPM;  b. 11/2013 Gen change- MDT ADDRL1 Adapta DC PPM, ser # [id number] H.  . Symptomatic PVCs   . Syncope and collapse    a. Felt to be vasovagal.  . Thyroid disease    on no meds per pt  . Type II diabetes mellitus ([place])   . Unspecified essential hypertension   . Unspecified  glaucoma(365.9)     Patient Active Problem List   Diagnosis Date Noted  . Eczema 04/03/2017  . Glaucoma (increased eye pressure) 04/03/2017  . Hiatal hernia 04/03/2017  . Iron deficiency anemia 04/03/2017  . Osteoarthritis 04/03/2017  . Pure hypercholesterolemia 04/03/2017  . Tinnitus 04/03/2017  . Valvular heart disease 04/03/2017  . Venous stasis 04/03/2017  . Bilateral hearing loss 03/07/2017  . Tinnitus, bilateral 03/07/2017  . Pounding noise in right ear 02/13/2017  . Eczematous skin lesions 02/02/2017  . URI with cough and congestion 12/13/2016  . Colicky RLQ abdominal pain 11/01/2016  . 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 Anxiety 07/28/2016  . Acute maxillary sinusitis 03/23/2016  . Pain of right hip joint 03/23/2016  . CKD (chronic kidney disease) stage 3, GFR 30-59 ml/min (HCC) 02/03/2016  . Chronic gout of right foot 12/16/2015  . Productive cough 10/26/2015  . Infection of skin of left ear lobe 10/11/2015  . Acute non-recurrent maxillary sinusitis 09/17/2015  . Allergic rhinitis 09/14/2015  . Hyperlipidemia 09/14/2015  . Gout attack 07/21/2015  . Anticoagulation monitoring, INR range 2-3 05/18/2015  . RLS (restless legs syndrome) 04/14/2015  . History of colon polyps 12/23/2014  . Crohn's  disease in remission ([place]) 09/15/2014  . 1st degree AV block   . Chronic diastolic heart failure ([place])   . Cardiac arrhythmia 12/01/2013  . Hemoptysis 11/24/2013  . Acute on chronic diastolic CHF (congestive heart failure) ([place]) 10/17/2013  . Episodic lightheadedness 07/16/2013  . Palpitations 07/16/2013  . Syncope 08/15/2012  . Back pain 05/30/2012  . OSA (obstructive sleep apnea) 01/08/2012  . DYSPNEA 01/28/2010  . PVC (premature ventricular contraction) 09/13/2009  . Flutter-fibrillation 09/13/2009  . EDEMA 08/23/2009  . Diabetes mellitus type 2, controlled, without complications ([place]) 52/84/1324  . GLAUCOMA 04/13/2009  . Essential hypertension 04/13/2009  .  PAF (paroxysmal atrial fibrillation) ([place]) 04/13/2009  . SICK SINUS/ TACHY-BRADY SYNDROME 04/13/2009  . GERD 04/13/2009  . Cardiac pacemaker - Medtronic 04/13/2009    Past Surgical History:  Procedure Laterality Date  . APPENDECTOMY    . CARDIOVERSION N/A 04/26/2017   Procedure: Cardioversion;  Surgeon: Minna Merritts, MD;  Location: [facility] ORS;  Service: Cardiovascular;  Laterality: N/A;  . DILATION AND CURETTAGE OF UTERUS    . INCISION AND DRAINAGE OF WOUND Right 03/1998   "leg; it was like a boil" (12/01/2013)  . INSERT / REPLACE / REMOVE PACEMAKER  2008; 12/01/2013   MDT ADDRL1 pacemaker - gen change by Dr Caryl Comes 12/01/2013  . LUNG BIOPSY  2010   [facility]  . PERMANENT PACEMAKER GENERATOR CHANGE N/A 12/01/2013   Procedure: PERMANENT PACEMAKER GENERATOR CHANGE;  Surgeon: Deboraha Sprang, MD;  Location: [facility] CATH LAB;  Service: Cardiovascular;  Laterality: N/A;  . [place]    Prior to Admission medications   Medication Sig Start Date End Date Taking? Authorizing Provider  ALPRAZolam Duanne Moron) 0.5 MG tablet Take 1 tablet (0.5 mg total) by mouth daily as needed. 02/02/17  Yes Keith Rake Asad A, MD  amLODipine (NORVASC) 10 MG tablet TAKE 1 TABLET (10 MG TOTAL) BY MOUTH DAILY. 12/20/16  Yes Rochel Brome A, MD  atorvastatin (LIPITOR) 20 MG tablet TAKE 1 TABLET (20 MG TOTAL) BY MOUTH DAILY. Patient taking differently: Take 20 mg by mouth at bedtime.  02/02/17  Yes Roselee Nova, MD  Brinzolamide-Brimonidine [facility]) 1-0.2 % SUSP Place 1 drop into both eyes 2 (two) times daily. 07/05/15  Yes Rochel Brome A, MD  carvedilol (COREG) 12.5 MG tablet TAKE 1 TABLET BY MOUTH IN THE MORNING WITH BREAKFAST AND 2 TABLETS IN THE EVENING BEFORE BED 09/18/17  Yes Gollan, Kathlene November, MD  colchicine 0.6 MG tablet Take 1.2mg s at first sign of gout flare, wait one hour then take 0.6mg  as needed for gout   Yes [provider]  esomeprazole (NEXIUM) 40 MG capsule TAKE 1 CAPSULE (40 MG  TOTAL) BY MOUTH DAILY. Patient taking differently: Take 40 mg by mouth daily before breakfast.  11/16/16  Yes Rochel Brome A, MD  ferrous sulfate 325 (65 FE) MG EC tablet Take 325 mg by mouth daily. 03/01/17  Yes [provider]  flecainide (TAMBOCOR) 100 MG tablet Take 1 tablet (100 mg total) by mouth 2 (two) times daily. 04/02/17  Yes Gollan, Kathlene November, MD  furosemide (LASIX) 20 MG tablet Take 1 tablet (20 mg total) by mouth daily. Patient taking differently: Take 40 mg by mouth daily. May take an additional 20mg s 6 hours after first dose as needed for swelling 02/02/17  Yes Keith Rake Asad A, MD  glipiZIDE (GLUCOTROL) 10 MG tablet TAKE 1 TABLET (10 MG TOTAL) BY MOUTH 2 (TWO) TIMES DAILY BEFORE  A MEAL. 01/29/17  Yes Keith Rake Asad A, MD  metFORMIN (GLUCOPHAGE-XR) 500 MG 24 hr tablet TAKE 1 TABLET (500 MG TOTAL) BY MOUTH 2 (TWO) TIMES DAILY. 02/02/17  Yes Roselee Nova, MD  Multiple Vitamins-Minerals (CENTRUM SILVER PO) Take 1 tablet by mouth daily.    Yes [provider]  olmesartan (BENICAR) 40 MG tablet TAKE 1 TABLET (40 MG TOTAL) BY MOUTH DAILY. 12/19/16  Yes Keith Rake Asad A, MD  TRADJENTA 5 MG TABS tablet TAKE 1 TABLET (5 MG TOTAL) BY MOUTH DAILY. Patient taking differently: TAKE 1 TABLET (5 MG TOTAL) BY MOUTH DAILY WITH BREAKFAST 12/07/16  Yes Keith Rake Asad A, MD  travoprost, benzalkonium, (TRAVATAN) 0.004 % ophthalmic solution Place 1 drop into both eyes at bedtime.    Yes [provider]  warfarin (COUMADIN) 3 MG tablet TAKE 1-1.5 TABLETS DAILY AT 6 PM. TAKE 1 TAB ON TUES, THURS, SAT, SUN. TAKE 4.5 MG ON MON, WED, FRI Patient taking differently: TAKE 3MG  DAILY ON TUES, THURS, SAT, SUN. TAKE 4.5 MG DAILY ON MON, WED, FRI AT 6PM 10/25/15  Yes Roselee Nova, MD  ACCU-CHEK SOFTCLIX LANCETS lancets as directed. Check CBG's fasting once daily. Dx: E11.22, N18.3 05/14/17   [provider]  acetaminophen (TYLENOL) 500 MG tablet Take 1,000 mg by mouth every 6  (six) hours as needed for moderate pain.     [provider]  butalbital-acetaminophen-caffeine (FIORICET, ESGIC) 50-325-40 MG tablet Take 1-2 tablets by mouth every 6 (six) hours as needed for headache. 10/05/17 10/05/18  Darel Hong, MD  Glycerin, Laxative, (FLEET LIQUID GLYCERIN SUPP RE) Place 1 Dose rectally daily as needed (constipation).    [provider]  Menthol, Topical Analgesic, (ICY HOT EX) Apply 1 application topically daily as needed (pain).    [provider]  mometasone (ELOCON) 0.1 % cream Apply topically daily. Apply to affected area as directed two times a day Patient not taking: Reported on 10/05/2017 02/02/17   Roselee Nova, MD  polyethylene glycol [facility] / GLYCOLAX) packet Take 17 g by mouth daily as needed for moderate constipation.    [provider]    Allergies Metoprolol; Diltiazem; and Levofloxacin  Family History  Problem Relation Age of Onset  . Heart disease Father   . Rheum arthritis Father   . Allergies Father   . Liver cancer Mother   . Pancreatic cancer Mother   . Hypertension Sister   . Rheum arthritis Sister     Social History Social History  Substance Use Topics  . Smoking status: Former Smoker    Packs/day: 0.50    Years: 34.00    Types: Cigarettes    Quit date: 08/15/1989  . Smokeless tobacco: Never Used  . Alcohol use No    Review of Systems Constitutional: No fever/chills Eyes: No visual changes. ENT: No sore throat. Cardiovascular: Denies chest pain. Respiratory: Denies shortness of breath. Gastrointestinal: No abdominal pain.  No nausea, no vomiting.  No diarrhea.  No constipation. Genitourinary: Negative for dysuria. Musculoskeletal: Negative for back pain. Skin: Negative for rash. Neurological: Positive for headache   ____________________________________________   PHYSICAL EXAM:  VITAL SIGNS: ED Triage Vitals [10/05/17 0302]  Enc Vitals Group     BP (!) 145/73     Pulse  Rate 66     Resp 17     Temp 98.3 F (36.8 C)     Temp Source Oral     SpO2 98 %  Weight      Height      Head Circumference      Peak Flow      Pain Score      Pain Loc      Pain Edu?      Excl. in [place]?     Constitutional: Alert and oriented x4 pleasant cooperative speaks full clear sentences no diaphoresis Eyes: PERRL EOMI. Head: Atraumatic. Nose: No congestion/rhinnorhea. Mouth/Throat: No trismus Neck: No stridor.   Cardiovascular: Normal rate, regular rhythm. Grossly normal heart sounds.  Good peripheral circulation. Respiratory: Normal respiratory effort.  No retractions. Lungs CTAB and moving good air Gastrointestinal: Soft nontender Musculoskeletal: No lower extremity edema   Neurologic:   Neuro exam somewhat challenging as the patient has significant arthritis No pronator drift Slightly decreased grip on the right compared to left Equal and normal strength bilateral lower extremities Skin:  Skin is warm, dry and intact. No rash noted. Psychiatric: Mood and affect are normal. Speech and behavior are normal.    ____________________________________________   DIFFERENTIAL includes but not limited to  Stroke, TIA, intracerebral hemorrhage, aortic dissection ____________________________________________   LABS (all labs ordered are listed, but only abnormal results are displayed)  Labs Reviewed  PROTIME-INR - Abnormal; Notable for the following:       Result Value   Prothrombin Time 21.8 (*)    All other components within normal limits  APTT - Abnormal; Notable for the following:    aPTT 41 (*)    All other components within normal limits  CBC - Abnormal; Notable for the following:    Hemoglobin 11.3 (*)    HCT 33.7 (*)    MCV 75.7 (*)    MCH 25.4 (*)    RDW 17.0 (*)    All other components within normal limits  COMPREHENSIVE METABOLIC PANEL - Abnormal; Notable for the following:    Creatinine, Ser 1.41 (*)    GFR calc non Af Amer 34 (*)    GFR calc  Af Amer 40 (*)    All other components within normal limits  URINALYSIS, ROUTINE W REFLEX MICROSCOPIC - Abnormal; Notable for the following:    Color, Urine STRAW (*)    APPearance CLEAR (*)    Specific Gravity, Urine 1.004 (*)    All other components within normal limits  DIFFERENTIAL  TROPONIN I  ETHANOL  URINE DRUG SCREEN, QUALITATIVE ([facility] ONLY)  GLUCOSE, CAPILLARY  CBG MONITORING, ED    Blood work reviewed and interpreted by me shows slightly subtherapeutic on her INR __________________________________________  EKG  ED ECG REPORT I, Darel Hong, the attending physician, personally viewed and interpreted this ECG.  Date: 10/05/2017 EKG Time:  Rate: 73 Rhythm: Atrial paced rhythm QRS Axis: normal Intervals: Prolonged AV conduction ST/T Wave abnormalities: normal Narrative Interpretation: no evidence of acute ischemia  ____________________________________________  RADIOLOGY  Head CT reviewed by me with no acute disease CT angios the head and neck reviewed by me with no acute disease ____________________________________________   PROCEDURES  Procedure(s) performed: no  Procedures  Critical Care performed: no  Observation: no ____________________________________________   INITIAL IMPRESSION / ASSESSMENT AND PLAN / ED COURSE  Pertinent labs & imaging results that were available during my care of the patient were reviewed by me and considered in my medical decision making (see chart for details).      ----------------------------------------- 3:28 AM on 10/05/2017 -----------------------------------------  The patient arrives with a sudden onset severe throbbing right-sided headache unlike any headache she is  ever had before.  It awoke her from her sleep at 1:30 AM about 2 hours ago at this point.  She did have a CT head which was normal, however given her chest pain radiating up her neck concerned that there may be a dissection as well.  She appears  to have some aphasia although it is difficult to fully interpret.  She has severe arthritis and neuro exam is difficult however she may have some pronator drift on the right as well as some decreased strength in her bilateral lower extremities.  Code stroke has been called.  ____________________________________________ 5:02 AM on 10/05/2017 -----------------------------------------  Official recommendation from Dr. Geraldine Solar: Primary diagnosis is transient ischemic attack  Continue Coumadin Glucose under 180 Correct hypovolemia MRI head plus MRA neck (if MRI not doable then CTA head and neck) All recommendations unless contraindications   ----------------------------------------- 5:35 AM on 10/05/2017 -----------------------------------------  I had a lengthy discussion with the patient regarding her findings and that it would be very unusual for a stroke to cause a headache and headache is her primary symptom.  She has a pacemaker that is not MRI compatible.  Her symptoms are completely resolved.  She is already anticoagulated and if this were a TIA she is already receiving appropriate therapy.  She understands to follow-up with her primary care physician this coming Monday for reevaluation and to establish care with neurology as needed.  ----------------------------------------- 6:24 AM on 10/05/2017 -----------------------------------------  The patient's headache is improved.  At this point she remains neuro intact and she is medically stable for outpatient management.  The patient and her family verbalized understanding and agreed with the plan.  FINAL CLINICAL IMPRESSION(S) / ED DIAGNOSES  Final diagnoses:  Nonintractable headache, unspecified chronicity pattern, unspecified headache type      NEW MEDICATIONS STARTED DURING THIS VISIT:  New Prescriptions   BUTALBITAL-ACETAMINOPHEN-CAFFEINE (FIORICET, ESGIC) 50-325-40 MG TABLET    Take 1-2 tablets by mouth every 6 (six)  hours as needed for headache.     Note:  This document was prepared using Dragon voice recognition software and may include unintentional dictation errors.     Darel Hong, MD 10/05/17 [phone]

## 2017-10-05 NOTE — ED Notes (Signed)
Chaplin at bedside talking with patient. He called her son for her and pt talked to him. She did not want him to come up to see her now. She will call him later

## 2017-10-23 ENCOUNTER — Other Ambulatory Visit: Payer: Self-pay | Admitting: Cardiovascular Disease

## 2017-11-22 ENCOUNTER — Other Ambulatory Visit: Payer: Self-pay | Admitting: Family Medicine

## 2017-11-22 DIAGNOSIS — Z1231 Encounter for screening mammogram for malignant neoplasm of breast: Secondary | ICD-10-CM

## 2017-11-29 ENCOUNTER — Inpatient Hospital Stay: Payer: Medicare Other | Attending: Oncology

## 2017-11-29 DIAGNOSIS — Z79899 Other long term (current) drug therapy: Secondary | ICD-10-CM | POA: Diagnosis not present

## 2017-11-29 DIAGNOSIS — Z87891 Personal history of nicotine dependence: Secondary | ICD-10-CM | POA: Insufficient documentation

## 2017-11-29 DIAGNOSIS — M1990 Unspecified osteoarthritis, unspecified site: Secondary | ICD-10-CM | POA: Diagnosis not present

## 2017-11-29 DIAGNOSIS — E1122 Type 2 diabetes mellitus with diabetic chronic kidney disease: Secondary | ICD-10-CM | POA: Insufficient documentation

## 2017-11-29 DIAGNOSIS — Z87440 Personal history of urinary (tract) infections: Secondary | ICD-10-CM | POA: Insufficient documentation

## 2017-11-29 DIAGNOSIS — D509 Iron deficiency anemia, unspecified: Secondary | ICD-10-CM

## 2017-11-29 DIAGNOSIS — G4733 Obstructive sleep apnea (adult) (pediatric): Secondary | ICD-10-CM | POA: Insufficient documentation

## 2017-11-29 DIAGNOSIS — E785 Hyperlipidemia, unspecified: Secondary | ICD-10-CM | POA: Insufficient documentation

## 2017-11-29 DIAGNOSIS — F419 Anxiety disorder, unspecified: Secondary | ICD-10-CM | POA: Insufficient documentation

## 2017-11-29 DIAGNOSIS — H409 Unspecified glaucoma: Secondary | ICD-10-CM | POA: Insufficient documentation

## 2017-11-29 DIAGNOSIS — E079 Disorder of thyroid, unspecified: Secondary | ICD-10-CM | POA: Insufficient documentation

## 2017-11-29 DIAGNOSIS — I440 Atrioventricular block, first degree: Secondary | ICD-10-CM | POA: Insufficient documentation

## 2017-11-29 DIAGNOSIS — I5032 Chronic diastolic (congestive) heart failure: Secondary | ICD-10-CM | POA: Insufficient documentation

## 2017-11-29 DIAGNOSIS — D582 Other hemoglobinopathies: Secondary | ICD-10-CM | POA: Insufficient documentation

## 2017-11-29 DIAGNOSIS — I130 Hypertensive heart and chronic kidney disease with heart failure and stage 1 through stage 4 chronic kidney disease, or unspecified chronic kidney disease: Secondary | ICD-10-CM | POA: Insufficient documentation

## 2017-11-29 DIAGNOSIS — Z950 Presence of cardiac pacemaker: Secondary | ICD-10-CM | POA: Insufficient documentation

## 2017-11-29 DIAGNOSIS — I480 Paroxysmal atrial fibrillation: Secondary | ICD-10-CM | POA: Diagnosis not present

## 2017-11-29 DIAGNOSIS — K219 Gastro-esophageal reflux disease without esophagitis: Secondary | ICD-10-CM | POA: Diagnosis not present

## 2017-11-29 DIAGNOSIS — Z7984 Long term (current) use of oral hypoglycemic drugs: Secondary | ICD-10-CM | POA: Diagnosis not present

## 2017-11-29 DIAGNOSIS — N183 Chronic kidney disease, stage 3 (moderate): Secondary | ICD-10-CM | POA: Insufficient documentation

## 2017-11-29 DIAGNOSIS — Z7901 Long term (current) use of anticoagulants: Secondary | ICD-10-CM | POA: Diagnosis not present

## 2017-11-29 LAB — CBC WITH DIFFERENTIAL/PLATELET
Basophils Absolute: 0 10*3/uL (ref 0–0.1)
Basophils Relative: 0 %
Eosinophils Absolute: 0.2 10*3/uL (ref 0–0.7)
Eosinophils Relative: 3 %
HCT: 34.8 % — ABNORMAL LOW (ref 35.0–47.0)
Hemoglobin: 11.6 g/dL — ABNORMAL LOW (ref 12.0–16.0)
Lymphocytes Relative: 25 %
Lymphs Abs: 1.6 10*3/uL (ref 1.0–3.6)
MCH: 25.7 pg — ABNORMAL LOW (ref 26.0–34.0)
MCHC: 33.2 g/dL (ref 32.0–36.0)
MCV: 77.4 fL — ABNORMAL LOW (ref 80.0–100.0)
Monocytes Absolute: 0.5 10*3/uL (ref 0.2–0.9)
Monocytes Relative: 8 %
Neutro Abs: 4.1 10*3/uL (ref 1.4–6.5)
Neutrophils Relative %: 64 %
Platelets: 170 10*3/uL (ref 150–440)
RBC: 4.5 MIL/uL (ref 3.80–5.20)
RDW: 16.6 % — ABNORMAL HIGH (ref 11.5–14.5)
WBC: 6.5 10*3/uL (ref 3.6–11.0)

## 2017-11-29 LAB — IRON AND TIBC
Iron: 83 ug/dL (ref 28–170)
Saturation Ratios: 35 % — ABNORMAL HIGH (ref 10.4–31.8)
TIBC: 235 ug/dL — ABNORMAL LOW (ref 250–450)
UIBC: 152 ug/dL

## 2017-11-29 LAB — FERRITIN: Ferritin: 158 ng/mL (ref 11–307)

## 2017-11-30 ENCOUNTER — Ambulatory Visit (INDEPENDENT_AMBULATORY_CARE_PROVIDER_SITE_OTHER): Payer: Medicare Other | Admitting: *Deleted

## 2017-11-30 DIAGNOSIS — I495 Sick sinus syndrome: Secondary | ICD-10-CM | POA: Diagnosis not present

## 2017-11-30 NOTE — Progress Notes (Signed)
Remote pacemaker transmission.   

## 2017-12-03 ENCOUNTER — Encounter: Payer: Self-pay | Admitting: Cardiology

## 2017-12-08 NOTE — Progress Notes (Signed)
Cardiology Office Note  Date:  12/10/2017   ID:  [name] [name], DOB 06-12-1935, MRN [id number]  PCP:  Dion Body, MD   Chief Complaint  Patient presents with  . OTHER    OD 6 month f/u no complaints today. Pt went to ED back in November for chest pain. Meds reviewed verbally with pt.    HPI:   [name] [name] is a 83 yo woman with a history of  Ejection fraction 50-55% by echocardiogram 2014 Paroxysmal atrial fibrillation, on warfarin Morbid obesity,  symptomatic palpitations/PVCs,  pacemaker implantation for sick sinus syndrome/tachybradycardia syndrome with chronic symptoms of palpitations,  shortness of breath with exertion,  chronic fatigue Obstructive sleep apnea on CPAP who presents for routine followup of her atrial fibrillation and shortness of breath  Seen in the emergency room October 05, 2017 Code stroke, chest pain Hospital records reviewed with the patient in detail Reported having severe right-sided headache woke her from sleep Unclear if she had slurred speech, tingling right arm right leg Had head CT scan no acute disease Possible TIA  Numerous issues to discuss on today's visit Today having episodes of flushing, Then causes palpitations, SOB Hot in the face, sometimes headache If she gets cold/fan, goes away 1  To 2 times a day Vitals are normal 3 to 4 months long  If AC on, does better Pounding in ear, previously seen by ENT  08/23/17 Woke up, sweats, tachycardia, could not get up,  "kept losing consciousness in the bed"  Chronic palpitations at nighttime in the morning Feels like she is going in and out of atrial fibrillation  No regular exercise program Feels she has lost weight, claims to have lost 20 pounds Prior history of chronic fatigue Anemia improved after iron infusion  Pacer download from sept 2018 reviewed showing rare tachycardia <1 min, x 3  EKG personally reviewed by myself on todays visit Shows paced rhythm rate 77  bpm  Other past medical history reviewed scheduled for cardioversion 03/27/2017 for atrial flutter/fib  Noted to be in normal sinus rhythm morning of the procedure, procedure was canceled  on flecainide 100 mg twice a day with anticoagulation, Carvedilol  Chronic palpitations at nighttime that was waking her up,   improved Rare short episodes of tachycardia lasting 3-5 minutes, not often other past   Previously Seen by pulmonary, increased CPAP pressures up to 10  Carotid u/s <39% b/l Thyroid ABN  Went to hospital 11/09/16, SOB, tingling, called 911  anemia, HCT 29 Had a CT scan, Possible anxiety Got hot  compliant with her CPAP.  Previous 30 day monitor concerning for episodes of atrial fibrillation  Hemoglobin A1c previously in the 8 range No regular exercise program Tolerating warfarin with no complications  Recent Pacer changes 10/15, felt better Takes bystolic 20 BID Wears compressions on a regular basis  Previously on HCTZ, had abd cramps, ABD pains (now stopped) Finger fungus, on lamacil daily  June 2013 total cholesterol 155, LDL 82, HDL 56  Previous hospital admission on 09/24/2013 for acute on chronic diastolic CHF. She was drinking a significant amount of fluids. She was treated with IV diuretics with improvement of her symptoms. Was recommended that she go home with Lasix every other day. She had a stress test while in the hospital showing no ischemia. Echocardiogram dated 09/24/2013 showing ejection fraction 50-55%,, mild to moderate tricuspid regurg, mild to moderate MR, normal RV function, normal right ventricular systolic pressure  ER again on 10/18/2013.  Previous hx of  shortness of breath and palpitations. May 2012, she had adjustments to her pacemaker and since that time has felt back to normal. she has had long hx of hot flashes, and a racing heart rate when she is in bed. Recently this has been well-controlled on high-dose bystolic.   chronic back and hip pain. She did receive a cortisone shot on the left with no significant improvement. She reports that her sugars have been well-controlled, blood pressure well controlled on current medications  syncope episode August 2013 . in her bedroom folding clothes and the next thing she knew she found herself on the floor in the bathroom. She spent 2 days in the hospital, then at least 10 days in rehabilitation for significant left arm bruising and she was on warfarin. Pacemaker was interrogated after the fact showing 85% atrial paced rhythm, no other arrhythmia. Etiology concerning for vasovagal episode, though exact cause is uncertain.  She has chronic buzzing in her ear. She does report this is a chronic issue going back many years. Prior workup with ear nose throat. She does have prior exposure to loud noises, worked in a [employer].  cardiac catheterization in 2004, stress testing,  echo showed normal systolic function, mild LVH, diastolic relaxation abnormality, mildly elevated right ventricular systolic pressures, mild MR. treadmill study showing normal blood pressure response to exercise, chronotropic incompetence with peak heart rate of 100 beats per minute, resting heart rate in the 70s. Previous PFTs demonstrated extrinsic compression/restrictive physiology.  PMH:   has a past medical history of 1st degree AV block, Anemia, Anxiety, Arthritis, Chest pain, Chronic diastolic heart failure (HCC), Chronic kidney disease, Congestive heart disease ([place]), Esophageal reflux, H/O hiatal hernia, History of UTI, Hyperlipidemia, Morbid obesity ([place]), OSA on CPAP, PAF (paroxysmal atrial fibrillation) ([place]), SSS (sick sinus syndrome) ([place]), Symptomatic PVCs, Syncope and collapse, Thyroid disease, Type II diabetes mellitus ([place]), Unspecified essential hypertension, and Unspecified glaucoma(365.9).  PSH:    Past Surgical History:  Procedure Laterality Date  . APPENDECTOMY    .  CARDIOVERSION N/A 04/26/2017   Procedure: Cardioversion;  Surgeon: Minna Merritts, MD;  Location: [facility] ORS;  Service: Cardiovascular;  Laterality: N/A;  . DILATION AND CURETTAGE OF UTERUS    . INCISION AND DRAINAGE OF WOUND Right 03/1998   "leg; it was like a boil" (12/01/2013)  . INSERT / REPLACE / REMOVE PACEMAKER  2008; 12/01/2013   MDT ADDRL1 pacemaker - gen change by Dr Caryl Comes 12/01/2013  . [facility]G [facility]OPSY  [facility]   [facility]  . PERMANENT PACEMAKER GENERATOR CHANGE N/A 12/01/2013   Procedure: PERMANENT PACEMAKER GENERATOR CHANGE;  Surgeon: Deboraha Sprang, MD;  Location[facility] C[facility] LAB;  Service: Cardiovascular;  Laterality: N/A;  . VAGINAL HYSTERECTOMY  1970    Current Outpatient Medications  Medication Sig Dispense Refill  . ACCU-CHEK SOFTCLIX LANCETS lancets as directed. Check CBG's fasting once daily. Dx: E11.22, N18.3    . acetaminophen (TYLENOL) 500 MG tablet Take 1,000 mg by mouth every 6 (six) hours as needed for moderate pain.     Marland Kitchen amLODipine (NORVASC) 10 MG tablet TAKE 1 TABLET (10 MG TOTAL) BY MOUTH DAILY. 90 tablet 0  . atorvastatin (LIPITOR) 20 MG tablet TAKE 1 TABLET (20 MG TOTAL) BY MOUTH DAILY. (Patient taking differently: Take 20 mg by mouth at bedtime. ) 90 tablet 0  . Brinzolamide-Brimonidine (SIMBRINZA) 1-0.2 % SUSP Place 1 drop into both eyes 2 (two) times daily. 1 Bottle 1  . carvedilol (COREG) 12.5 MG tablet TAKE 1 TABLET  BY MOUTH IN THE MORNING WITH BREAKFAST AND 2 TABLETS IN THE EVENING BEFORE BED 90 tablet 2  . colchicine 0.6 MG tablet as needed. Take 1.2mg s at first sign of gout flare, wait one hour then take 0.6mg  as needed for gout    . esomeprazole (NEXIUM) 40 MG capsule TAKE 1 CAPSULE (40 MG TOTAL) BY MOUTH DAILY. (Patient taking differently: Take 40 mg by mouth daily before breakfast. ) 90 capsule 1  . ferrous sulfate 325 (65 FE) MG EC tablet Take 325 mg by mouth daily.    . flecainide (TAMBOCOR) 100 MG tablet TAKE 1 TABLET BY MOUTH TWICE A DAY 60 tablet 3  .  furosemide (LASIX) 20 MG tablet Take 1 tablet (20 mg total) by mouth daily. (Patient taking differently: Take 40 mg by mouth daily. May take an additional 20mg s 6 hours after first dose as needed for swelling) 90 tablet 0  . glipiZIDE (GLUCOTROL) 10 MG tablet TAKE 1 TABLET (10 MG TOTAL) BY MOUTH 2 (TWO) TIMES DAILY BEFORE A MEAL. 180 tablet 0  . Glycerin, Laxative, (FLEET LIQUID GLYCERIN SUPP RE) Place 1 Dose rectally daily as needed (constipation).    . Menthol, Topical Analgesic, (ICY HOT EX) Apply 1 application topically daily as needed (pain).    . metFORMIN (GLUCOPHAGE-XR) 500 MG 24 hr tablet TAKE 1 TABLET (500 MG TOTAL) BY MOUTH 2 (TWO) TIMES DAILY. 180 tablet 0  . mometasone (ELOCON) 0.1 % cream Apply topically daily. Apply to affected area as directed two times a day 45 g 0  . Multiple Vitamins-Minerals (CENTRUM SILVER PO) Take 1 tablet by mouth daily.     Marland Kitchen olmesartan (BENICAR) 40 MG tablet TAKE 1 TABLET (40 MG TOTAL) BY MOUTH DAILY. 90 tablet 0  . polyethylene glycol (MIRALAX / GLYCOLAX) packet Take 17 g by mouth daily as needed for moderate constipation.    . TRADJENTA 5 MG TABS tablet TAKE 1 TABLET (5 MG TOTAL) BY MOUTH DAILY. (Patient taking differently: TAKE 1 TABLET (5 MG TOTAL) BY MOUTH DAILY WITH BREAKFAST) 30 tablet 2  . travoprost, benzalkonium, (TRAVATAN) 0.004 % ophthalmic solution Place 1 drop into both eyes at bedtime.     Marland Kitchen warfarin (COUMADIN) 3 MG tablet TAKE 1-1.5 TABLETS DAILY AT 6 PM. TAKE 1 TAB ON TUES, THURS, SAT, SUN. TAKE 4.5 MG ON MON, WED, FRI (Patient taking differently: TAKE 3MG  DAILY ON TUES, THURS, SAT, SUN. TAKE 4.5 MG DAILY ON MON, WED, FRI AT 6PM) 100 tablet 3   No current facility-administered medications for this visit.      Allergies:   Metoprolol; Diltiazem; and Levofloxacin   Social History:  The patient  reports that she quit smoking about 28 years ago. Her smoking use included cigarettes. She has a 17.00 pack-year smoking history. she has never used  smokeless tobacco. She reports that she does not drink alcohol or use drugs.   Family History:   family history includes Allergies in her father; Heart disease in her father; Hypertension in her sister; Liver cancer in her mother; Pancreatic cancer in her mother; Rheum arthritis in her father and sister.    Review of Systems: Review of Systems  Constitutional: Positive for malaise/fatigue.       Facial flushing  HENT: Negative.   Respiratory: Negative.   Cardiovascular: Positive for palpitations.  Gastrointestinal: Negative.   Musculoskeletal: Negative.   Neurological: Negative.   Psychiatric/Behavioral: Negative.   All other systems reviewed and are negative.    PHYSICAL EXAM: VS:  BP 118/60 (BP Location: Left Arm, Patient Position: Sitting, Cuff Size: Large)   Pulse 77   Ht 5' 3.5" (1.613 m)   Wt 239 lb 8 oz (108.6 kg)   BMI 41.76 kg/m  , BMI Body mass index is 41.76 kg/m.  GEN: Well nourished, well developed, in no acute distress, obese HEENT: normal  Neck: no JVD, carotid bruits, or masses Cardiac: Irregularly irregular,; no murmurs, rubs, or gallops, trace ankle edema bilaterally Respiratory:  clear to auscultation bilaterally, normal work of breathing GI: soft, nontender, nondistended, + BS MS: no deformity or atrophy  Skin: warm and dry, no rash Neuro:  Strength and sensation are intact Psych: euthymic mood, full affect    Recent Labs: 02/13/2017: Magnesium 1.7 05/24/2017: TSH 0.868 10/05/2017: ALT 20; BUN 17; Creatinine, Ser 1.41; Potassium 3.6; Sodium 140 11/29/2017: Hemoglobin 11.6; Platelets 170    Lipid Panel Lab Results  Component Value Date   CHOL 156 11/01/2016   HDL 58 11/01/2016   LDLCALC 79 11/01/2016   TRIG 93 11/01/2016      Wt Readings from Last 3 Encounters:  12/10/17 239 lb 8 oz (108.6 kg)  08/30/17 239 lb (108.4 kg)  06/11/17 241 lb 6.4 oz (109.5 kg)       ASSESSMENT AND PLAN:   PVC (premature ventricular contraction) -   Continue current meds, stable  Paroxysmal atrial fibrillation (HCC) - Plan: EKG 12-Lead on flecainide 100 twice a day Converted on her own without DCCV   no medication changes made Tolerating anticoagulation Chronic palpitations at nighttime possibly related to her sleep apnea Flushing in the face likely unrelated  Facial flushing Long time spent discussing her facial flushing, palpitations Having episodes lasting seconds at a time where she feels like she is going to pass out that improved with the pain in her face reports blood pressure heart rate stable during these episodes.  Suggested she try to move her amlodipine to the evening.  Unable to exclude orthostasis.  No significant arrhythmia on pacer download in the past   Hyperlipidemia, unspecified hyperlipidemia type Cholesterol is at goal on the current lipid regimen. No changes to the medications were made.stable  Acute on chronic diastolic CHF (congestive heart failure) (HCC) euvolemic , We have encouraged continued exercise, careful diet management in an effort to lose weight.  Controlled type 2 diabetes mellitus without complication, without long-term current use of insulin (HCC) Previously eating out a lot, encouraged low carb  Cardiac pacemaker - Medtronic Followed by EP in [place], Dr. Lovena Le  DYSPNEA Stable, has  fatigue    anemia Chronic issue, previous iron infusion Managed by the cancer center HCT 34.8   Total encounter time more than 25 minutes  Greater than 50% was spent in counseling and coordination of care with the patient   Disposition:   F/U  12 month    Orders Placed This Encounter  Procedures  . EKG 12-Lead     Signed, Esmond Plants, M.D., Ph.D. 12/10/2017  [place], [place]

## 2017-12-10 ENCOUNTER — Ambulatory Visit (INDEPENDENT_AMBULATORY_CARE_PROVIDER_SITE_OTHER): Payer: Medicare Other | Admitting: Cardiovascular Disease

## 2017-12-10 ENCOUNTER — Encounter: Payer: Self-pay | Admitting: Cardiovascular Disease

## 2017-12-10 VITALS — BP 118/60 | HR 77 | Ht 63.5 in | Wt 239.5 lb

## 2017-12-10 DIAGNOSIS — I10 Essential (primary) hypertension: Secondary | ICD-10-CM

## 2017-12-10 DIAGNOSIS — R600 Localized edema: Secondary | ICD-10-CM

## 2017-12-10 DIAGNOSIS — I5033 Acute on chronic diastolic (congestive) heart failure: Secondary | ICD-10-CM

## 2017-12-10 DIAGNOSIS — I5032 Chronic diastolic (congestive) heart failure: Secondary | ICD-10-CM | POA: Diagnosis not present

## 2017-12-10 DIAGNOSIS — I480 Paroxysmal atrial fibrillation: Secondary | ICD-10-CM | POA: Diagnosis not present

## 2017-12-10 DIAGNOSIS — Z950 Presence of cardiac pacemaker: Secondary | ICD-10-CM

## 2017-12-10 DIAGNOSIS — E119 Type 2 diabetes mellitus without complications: Secondary | ICD-10-CM | POA: Diagnosis not present

## 2017-12-10 NOTE — Patient Instructions (Addendum)

## 2017-12-14 ENCOUNTER — Ambulatory Visit (INDEPENDENT_AMBULATORY_CARE_PROVIDER_SITE_OTHER): Payer: Medicare Other | Admitting: Internal Medicine

## 2017-12-14 ENCOUNTER — Encounter (INDEPENDENT_AMBULATORY_CARE_PROVIDER_SITE_OTHER): Payer: Self-pay

## 2017-12-14 ENCOUNTER — Encounter: Payer: Self-pay | Admitting: Internal Medicine

## 2017-12-14 VITALS — BP 128/78 | HR 81 | Ht 64.0 in | Wt 238.0 lb

## 2017-12-14 DIAGNOSIS — Z950 Presence of cardiac pacemaker: Secondary | ICD-10-CM | POA: Diagnosis not present

## 2017-12-14 DIAGNOSIS — I440 Atrioventricular block, first degree: Secondary | ICD-10-CM

## 2017-12-14 DIAGNOSIS — I480 Paroxysmal atrial fibrillation: Secondary | ICD-10-CM

## 2017-12-14 DIAGNOSIS — I10 Essential (primary) hypertension: Secondary | ICD-10-CM

## 2017-12-14 NOTE — Progress Notes (Signed)
HPI Mrs. Langenberg returns today for ongoing evaluation and management of her PPM and sinus node dysfunction. She also has PAF and HTN and obesity. She has done fairly well in the interim. She has been sedentary but states she plans to start back exercising. She has rare palpitations but denies syncope. She has lost almost 15 pounds. Allergies  Allergen Reactions  . Metoprolol Other (See Comments)    Bad dreams Patient stated that she had suicidal thoughts while taking this medication.  . Diltiazem Other (See Comments)    Patient stated that she had bad dreams with this medication  . Levofloxacin Nausea And Vomiting     Current Outpatient Medications  Medication Sig Dispense Refill  . ACCU-CHEK SOFTCLIX LANCETS lancets as directed. Check CBG's fasting once daily. Dx: E11.22, N18.3    . acetaminophen (TYLENOL) 500 MG tablet Take 1,000 mg by mouth every 6 (six) hours as needed for moderate pain.     Marland Kitchen amLODipine (NORVASC) 10 MG tablet TAKE 1 TABLET (10 MG TOTAL) BY MOUTH DAILY. 90 tablet 0  . atorvastatin (LIPITOR) 20 MG tablet TAKE 1 TABLET (20 MG TOTAL) BY MOUTH DAILY. (Patient taking differently: Take 20 mg by mouth at bedtime. ) 90 tablet 0  . Brinzolamide-Brimonidine (SIMBRINZA) 1-0.2 % SUSP Place 1 drop into both eyes 2 (two) times daily. 1 Bottle 1  . carvedilol (COREG) 12.5 MG tablet TAKE 1 TABLET BY MOUTH IN THE MORNING WITH BREAKFAST AND 2 TABLETS IN THE EVENING BEFORE BED 90 tablet 2  . colchicine 0.6 MG tablet as needed. Take 1.2mg s at first sign of gout flare, wait one hour then take 0.6mg  as needed for gout    . esomeprazole (NEXIUM) 40 MG capsule TAKE 1 CAPSULE (40 MG TOTAL) BY MOUTH DAILY. (Patient taking differently: Take 40 mg by mouth daily before breakfast. ) 90 capsule 1  . ferrous sulfate 325 (65 FE) MG EC tablet Take 325 mg by mouth daily.    . flecainide (TAMBOCOR) 100 MG tablet TAKE 1 TABLET BY MOUTH TWICE A DAY 60 tablet 3  . furosemide (LASIX) 20 MG tablet Take  1 tablet (20 mg total) by mouth daily. (Patient taking differently: Take 40 mg by mouth daily. May take an additional 20mg s 6 hours after first dose as needed for swelling) 90 tablet 0  . glipiZIDE (GLUCOTROL) 10 MG tablet TAKE 1 TABLET (10 MG TOTAL) BY MOUTH 2 (TWO) TIMES DAILY BEFORE A MEAL. 180 tablet 0  . Glycerin, Laxative, (FLEET LIQUID GLYCERIN SUPP RE) Place 1 Dose rectally daily as needed (constipation).    . Menthol, Topical Analgesic, (ICY HOT EX) Apply 1 application topically daily as needed (pain).    . metFORMIN (GLUCOPHAGE-XR) 500 MG 24 hr tablet TAKE 1 TABLET (500 MG TOTAL) BY MOUTH 2 (TWO) TIMES DAILY. 180 tablet 0  . mometasone (ELOCON) 0.1 % cream Apply topically daily. Apply to affected area as directed two times a day 45 g 0  . Multiple Vitamins-Minerals (CENTRUM SILVER PO) Take 1 tablet by mouth daily.     Marland Kitchen olmesartan (BENICAR) 40 MG tablet TAKE 1 TABLET (40 MG TOTAL) BY MOUTH DAILY. 90 tablet 0  . polyethylene glycol (MIRALAX / GLYCOLAX) packet Take 17 g by mouth daily as needed for moderate constipation.    . TRADJENTA 5 MG TABS tablet TAKE 1 TABLET (5 MG TOTAL) BY MOUTH DAILY. (Patient taking differently: TAKE 1 TABLET (5 MG TOTAL) BY MOUTH DAILY WITH BREAKFAST) 30 tablet 2  .  travoprost, benzalkonium, (TRAVATAN) 0.004 % ophthalmic solution Place 1 drop into both eyes at bedtime.     Marland Kitchen warfarin (COUMADIN) 3 MG tablet TAKE 1-1.5 TABLETS DAILY AT 6 PM. TAKE 1 TAB ON TUES, THURS, SAT, SUN. TAKE 4.5 MG ON MON, WED, FRI (Patient taking differently: TAKE 3MG  DAILY ON TUES, THURS, SAT, SUN. TAKE 4.5 MG DAILY ON MON, WED, FRI AT 6PM) 100 tablet 3   No current facility-administered medications for this visit.      Past Medical History:  Diagnosis Date  . 1st degree AV block    a. PR 400 msec with Apacing  . Anemia   . Anxiety   . Arthritis    a. shoulders.  . Chest pain    a. 2004 Cath:  reportedly nl;  b. 09/2013 Neg MV.  . Chronic diastolic heart failure ([place])    a.  09/2013 Echo: EF 50-55%, mild LVH, mild to mod MR/TR.  Marland Kitchen Chronic kidney disease   . Congestive heart disease ([place])   . Esophageal reflux   . H/O hiatal hernia   . History of UTI   . Hyperlipidemia   . Morbid obesity ([place])   . OSA on CPAP   . PAF (paroxysmal atrial fibrillation) ([place])   . SSS (sick sinus syndrome) ([place])    a. 2008 s/p MDT PPM;  b. 11/2013 Gen change- MDT ADDRL1 Adapta DC PPM, ser # [id number] H.  . Symptomatic PVCs   . Syncope and collapse    a. Felt to be vasovagal.  . Thyroid disease    on no meds per pt  . Type II diabetes mellitus ([place])   . Unspecified essential hypertension   . Unspecified glaucoma(365.9)     ROS:   All systems reviewed and negative except as noted in the HPI.   Past Surgical History:  Procedure Laterality Date  . APPENDECTOMY    . CARDIOVERSION N/A 04/26/2017   Procedure: Cardioversion;  Surgeon: Minna Merritts, MD;  Location: [facility] ORS;  Service: Cardiovascular;  Laterality: N/A;  . DILATION AND CURETTAGE OF UTERUS    . INCISION AND DRAINAGE OF WOUND Right 03/1998   "leg; it was like a boil" (12/01/2013)  . INSERT / REPLACE / REMOVE PACEMAKER  2008; 12/01/2013   MDT ADDRL1 pacemaker - gen change by Dr Caryl Comes 12/01/2013  . LUNG BIOPSY  2010   [facility]  . PERMANENT PACEMAKER GENERATOR CHANGE N/A 12/01/2013   Procedure: PERMANENT PACEMAKER GENERATOR CHANGE;  Surgeon: Deboraha Sprang, MD;  Location: [facility] CATH LAB;  Service: Cardiovascular;  Laterality: N/A;  . VAGINAL HYSTERECTOMY  1970     Family History  Problem Relation Age of Onset  . Heart disease Father   . Rheum arthritis Father   . Allergies Father   . Liver cancer Mother   . Pancreatic cancer Mother   . Hypertension Sister   . Rheum arthritis Sister      Social History   Socioeconomic History  . Marital status: Widowed    Spouse name: Not on file  . Number of children: Y  . Years of education: Not on file  . Highest education level: Not on file  Social Needs  .  Financial resource strain: Not on file  . Food insecurity - worry: Not on file  . Food insecurity - inability: Not on file  . Transportation needs - medical: Not on file  . Transportation needs - non-medical: Not on file  Occupational History  . Occupation: retired  Tobacco  Use  . Smoking status: Former Smoker    Packs/day: 0.50    Years: 34.00    Pack years: 17.00    Types: Cigarettes    Last attempt to quit: 08/15/1989    Years since quitting: 28.3  . Smokeless tobacco: Never Used  Substance and Sexual Activity  . Alcohol use: No    Alcohol/week: 0.0 oz  . Drug use: No  . Sexual activity: No  Other Topics Concern  . Not on file  Social History Narrative   Retired. Widowed. Regularly exercises.      BP 128/78   Pulse 81   Ht 5\' 4"  (1.626 m)   Wt 238 lb (108 kg)   BMI 40.85 kg/m   Physical Exam:  Well appearing 83 yo woman, NAD HEENT: Unremarkable Neck:  6 cm JVD, no thyromegally Lymphatics:  No adenopathy Back:  No CVA tenderness Lungs:  Clear with no wheezes HEART:  Regular rate rhythm, no murmurs, no rubs, no clicks Abd:  soft, positive bowel sounds, no organomegally, no rebound, no guarding Ext:  2 plus pulses, no edema, no cyanosis, no clubbing Skin:  No rashes no nodules Neuro:  CN II through XII intact, motor grossly intact  EKG - NSR with AV pacing  DEVICE  Normal device function.  See PaceArt for details.   Assess/Plan: 1. Sinus node dysfunction - she is asymptomatic, s/p PPM insertion.  2. PPM - her medtronic DDD PM is working normally. Will recheck in several months.  3. Obesity - I have encouraged her to continue to lose weight.  4. HTN - she will continue medical therapy with coreg and amlodipine.  Mikle Bosworth.D.

## 2017-12-14 NOTE — Patient Instructions (Signed)
Medication Instructions:  Your physician recommends that you continue on your current medications as directed. Please refer to the Current Medication list given to you today.  Labwork: None ordered.  Testing/Procedures: None ordered.  Follow-Up: Your physician wants you to follow-up in: one year with Dr. Lovena Le.   You will receive a reminder letter in the mail two months in advance. If you don't receive a letter, please call our office to schedule the follow-up appointment.  Remote monitoring is used to monitor your Pacemaker from home. This monitoring reduces the number of office visits required to check your device to one time per year. It allows [place] to keep an eye on the functioning of your device to ensure it is working properly. You are scheduled for a device check from home on 03/05/2018. You may send your transmission at any time that day. If you have a wireless device, the transmission will be sent automatically. After your physician reviews your transmission, you will receive a postcard with your next transmission date.  Any Other Special Instructions Will Be Listed Below (If Applicable).  If you need a refill on your cardiac medications before your next appointment, please call your pharmacy.

## 2017-12-18 ENCOUNTER — Ambulatory Visit
Admission: RE | Admit: 2017-12-18 | Discharge: 2017-12-18 | Disposition: A | Discharge status: Home / Self Care | Payer: Medicare Other | Source: Ambulatory Visit | Attending: Family Medicine | Admitting: Family Medicine

## 2017-12-18 DIAGNOSIS — R928 Other abnormal and inconclusive findings on diagnostic imaging of breast: Secondary | ICD-10-CM | POA: Diagnosis not present

## 2017-12-18 DIAGNOSIS — N6320 Unspecified lump in the left breast, unspecified quadrant: Secondary | ICD-10-CM | POA: Diagnosis not present

## 2017-12-18 DIAGNOSIS — Z1231 Encounter for screening mammogram for malignant neoplasm of breast: Secondary | ICD-10-CM

## 2017-12-18 LAB — CUP PACEART INCLINIC DEVICE CHECK
Battery Impedance: 204 Ohm
Battery Remaining Longevity: 119 mo
Battery Voltage: 2.79 V
Brady Statistic AP VP Percent: 0 %
Brady Statistic AP VS Percent: 99 %
Brady Statistic AS VP Percent: 0 %
Brady Statistic AS VS Percent: 0 %
Date Time Interrogation Session: 20190111140919
Implantable Lead Implant Date: 20080117
Implantable Lead Implant Date: 20080117
Implantable Lead Location: 753859
Implantable Lead Location: 753860
Implantable Lead Model: 5076
Implantable Lead Model: 5076
Implantable Pulse Generator Implant Date: 20141229
Lead Channel Impedance Value: 434 Ohm
Lead Channel Impedance Value: 623 Ohm
Lead Channel Pacing Threshold Amplitude: 0.625 V
Lead Channel Pacing Threshold Amplitude: 0.75 V
Lead Channel Pacing Threshold Amplitude: 1 V
Lead Channel Pacing Threshold Amplitude: 1 V
Lead Channel Pacing Threshold Pulse Width: 0.4 ms
Lead Channel Pacing Threshold Pulse Width: 0.4 ms
Lead Channel Pacing Threshold Pulse Width: 0.4 ms
Lead Channel Pacing Threshold Pulse Width: 0.4 ms
Lead Channel Sensing Intrinsic Amplitude: 11.2 mV
Lead Channel Setting Pacing Amplitude: 2 V
Lead Channel Setting Pacing Amplitude: 2.5 V
Lead Channel Setting Pacing Pulse Width: 0.4 ms
Lead Channel Setting Sensing Sensitivity: 4 mV

## 2017-12-18 LAB — CUP PACEART REMOTE DEVICE CHECK
Battery Impedance: 204 Ohm
Battery Remaining Longevity: 119 mo
Battery Voltage: 2.79 V
Brady Statistic AP VP Percent: 0 %
Brady Statistic AP VS Percent: 99 %
Brady Statistic AS VP Percent: 0 %
Brady Statistic AS VS Percent: 0 %
Date Time Interrogation Session: 20181228141047
Date Time Interrogation Session: 20190115151827
Implantable Lead Implant Date: 20080117
Implantable Lead Implant Date: 20080117
Implantable Lead Implant Date: 20080117
Implantable Lead Implant Date: 20080117
Implantable Lead Location: 753859
Implantable Lead Location: 753860
Implantable Lead Location: 753859
Implantable Lead Location: 753860
Implantable Lead Model: 5076
Implantable Lead Model: 5076
Implantable Lead Model: 5076
Implantable Lead Model: 5076
Implantable Pulse Generator Implant Date: 20141229
Implantable Pulse Generator Implant Date: 20141229
Lead Channel Impedance Value: 446 Ohm
Lead Channel Impedance Value: 680 Ohm
Lead Channel Pacing Threshold Amplitude: 0.625 V
Lead Channel Pacing Threshold Amplitude: 1 V
Lead Channel Pacing Threshold Pulse Width: 0.4 ms
Lead Channel Pacing Threshold Pulse Width: 0.4 ms
Lead Channel Sensing Intrinsic Amplitude: 8 mV
Lead Channel Setting Pacing Amplitude: 2 V
Lead Channel Setting Pacing Amplitude: 2.5 V
Lead Channel Setting Pacing Pulse Width: 0.4 ms
Lead Channel Setting Sensing Sensitivity: 4 mV

## 2017-12-19 ENCOUNTER — Other Ambulatory Visit: Payer: Self-pay | Admitting: Cardiovascular Disease

## 2017-12-20 ENCOUNTER — Other Ambulatory Visit: Payer: Self-pay | Admitting: Family Medicine

## 2017-12-20 ENCOUNTER — Telehealth: Payer: Self-pay | Admitting: Cardiovascular Disease

## 2017-12-20 DIAGNOSIS — N6320 Unspecified lump in the left breast, unspecified quadrant: Secondary | ICD-10-CM

## 2017-12-20 DIAGNOSIS — R928 Other abnormal and inconclusive findings on diagnostic imaging of breast: Secondary | ICD-10-CM

## 2017-12-20 NOTE — Telephone Encounter (Signed)
S/w pharmacist, [name].  Wanted to make sure ok to prescribe carvedilol with patient having chronic diastolic heart failure which it is contraindicated with. Went back and looked in Dr Donivan Scull last office visit note on 12/10/17 and he notes patient has chronic diastolic heart failure.  She said she just needed to make sure it was still ok.

## 2017-12-20 NOTE — Telephone Encounter (Signed)
[name] with [facility] calling with drug interaction Carvedilol. Please call.

## 2017-12-27 ENCOUNTER — Ambulatory Visit
Admission: RE | Admit: 2017-12-27 | Discharge: 2017-12-27 | Disposition: A | Discharge status: Home / Self Care | Payer: Medicare Other | Source: Ambulatory Visit | Attending: Family Medicine | Admitting: Family Medicine

## 2017-12-27 DIAGNOSIS — N6320 Unspecified lump in the left breast, unspecified quadrant: Secondary | ICD-10-CM

## 2017-12-27 DIAGNOSIS — N641 Fat necrosis of breast: Secondary | ICD-10-CM | POA: Diagnosis not present

## 2017-12-27 DIAGNOSIS — R928 Other abnormal and inconclusive findings on diagnostic imaging of breast: Secondary | ICD-10-CM

## 2018-02-14 IMAGING — CR DG HIP (WITH OR WITHOUT PELVIS) 2-3V*R*
1 series · 3 of 3 positions shown · non-contrast
Comparison: None.

CLINICAL DATA: Right hip pain radiating to the groin, no injury

EXAM:
DG HIP (WITH OR WITHOUT PELVIS) 2-3V RIGHT

[Series 1: view not recorded · 0.14mm/px · 3 of 3 slices shown]
[im 1/3]
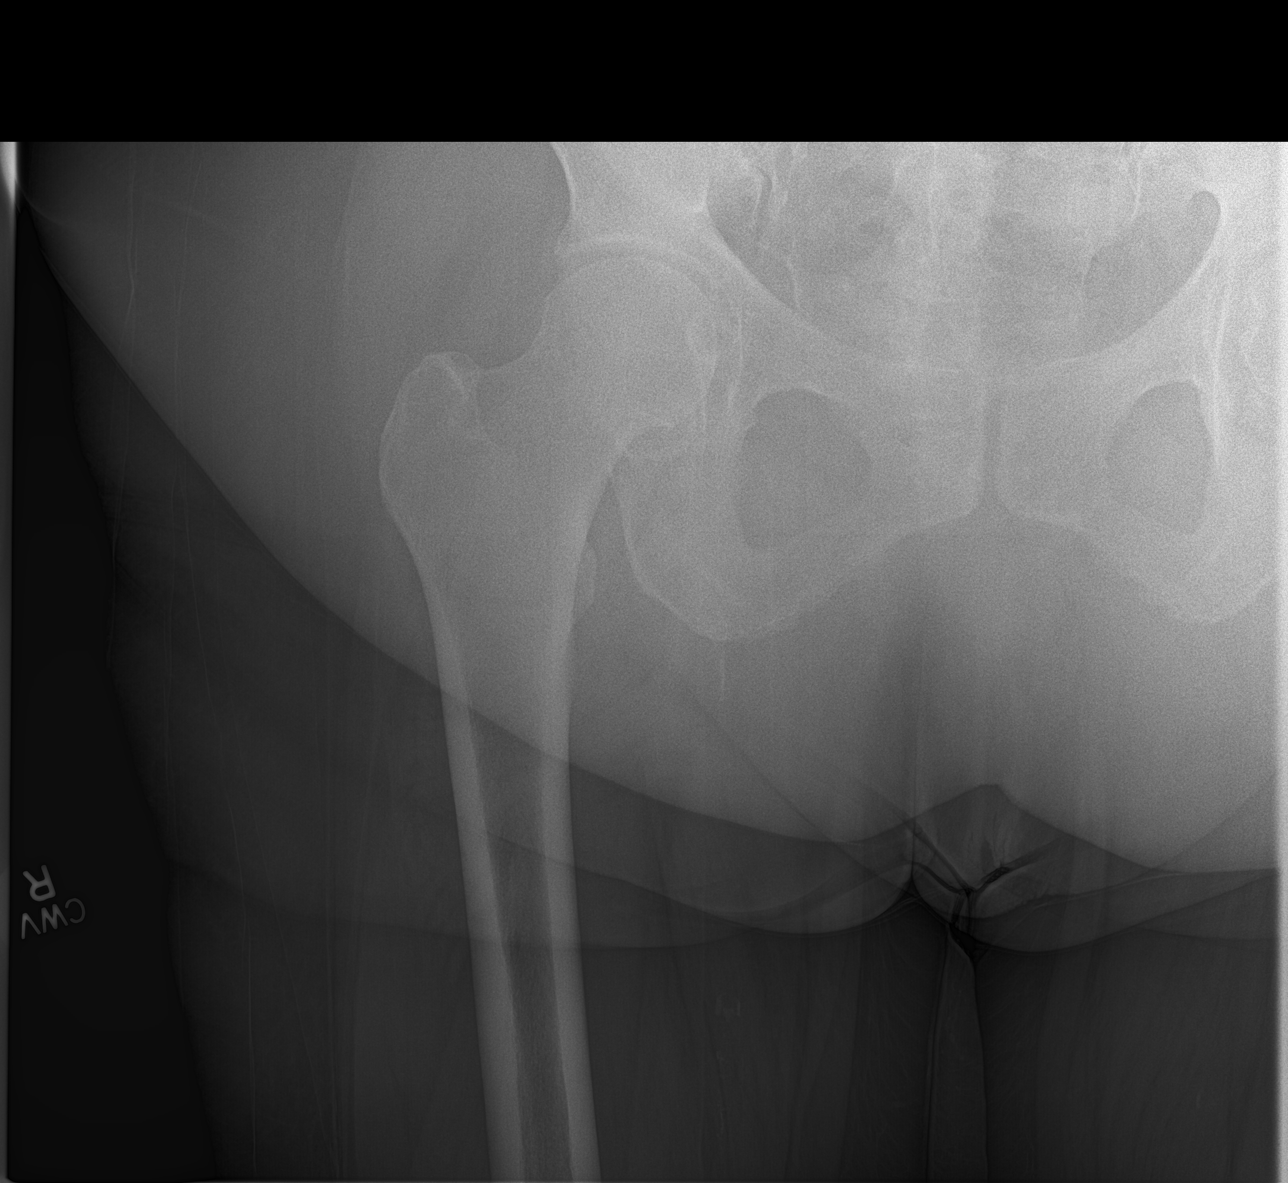
[im 2/3]
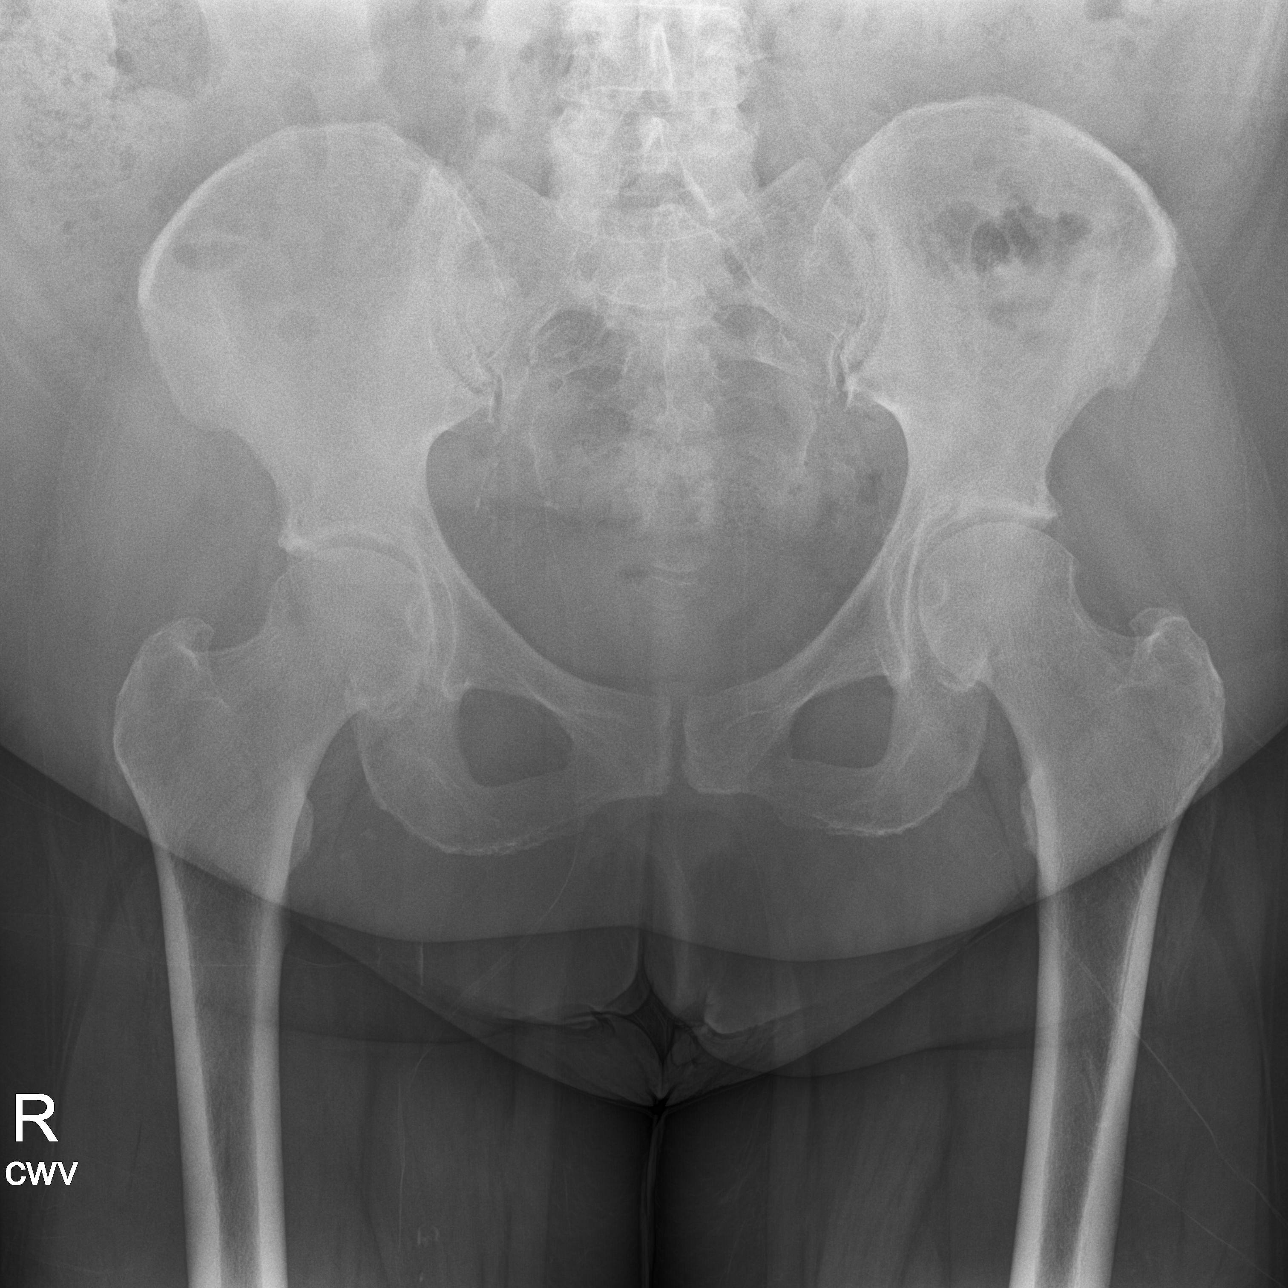
[im 3/3]
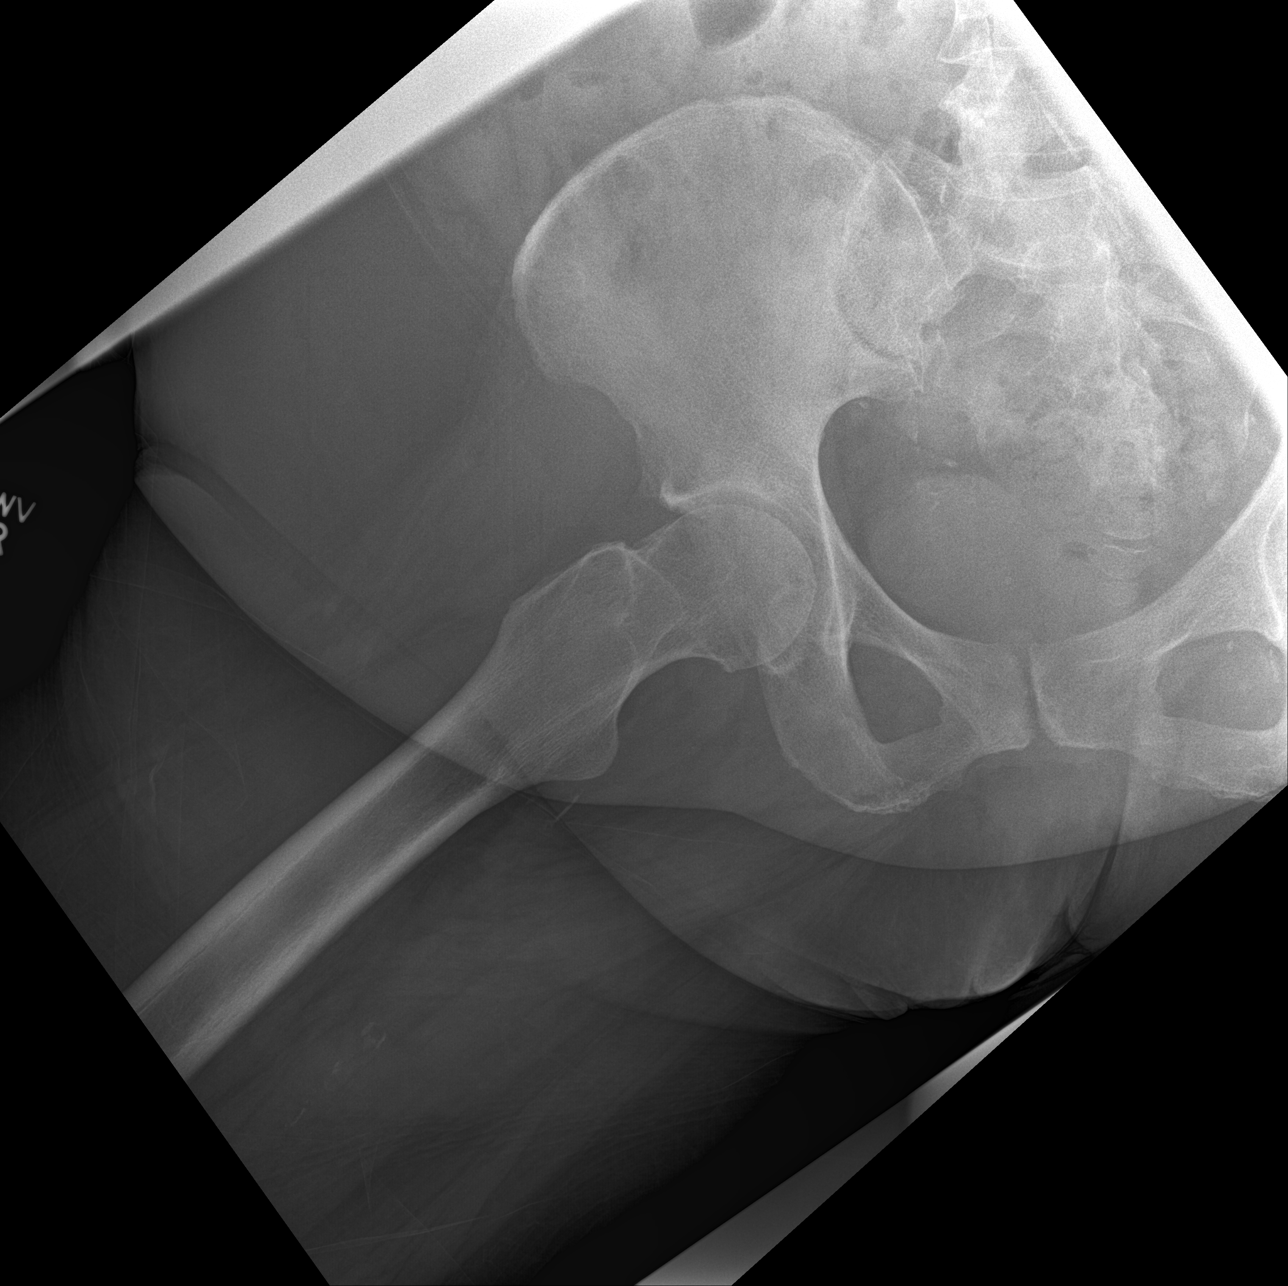

[3 of 3 positions shown; findings below may reference images not displayed]

FINDINGS: There is mild degenerative joint disease of the hips for age with
some loss of joint space superiorly and mild superior acetabular
spurring. No acute fracture is seen. The pelvic rami are intact. The
SI joints are corticated.
IMPRESSION: Mild degenerative joint disease of the hips.  No acute abnormality.

## 2018-02-27 DIAGNOSIS — Z7189 Other specified counseling: Secondary | ICD-10-CM | POA: Insufficient documentation

## 2018-02-27 DIAGNOSIS — Z0000 Encounter for general adult medical examination without abnormal findings: Secondary | ICD-10-CM | POA: Insufficient documentation

## 2018-02-27 DIAGNOSIS — Z7185 Encounter for immunization safety counseling: Secondary | ICD-10-CM | POA: Insufficient documentation

## 2018-02-28 ENCOUNTER — Encounter: Payer: Self-pay | Admitting: Oncology

## 2018-02-28 ENCOUNTER — Inpatient Hospital Stay: Payer: Medicare Other

## 2018-02-28 ENCOUNTER — Inpatient Hospital Stay: Payer: Medicare Other | Attending: Oncology | Admitting: Oncology

## 2018-02-28 VITALS — BP 119/73 | HR 68 | Temp 97.8°F | Resp 18 | Ht 64.0 in | Wt 244.7 lb

## 2018-02-28 DIAGNOSIS — D509 Iron deficiency anemia, unspecified: Secondary | ICD-10-CM | POA: Diagnosis present

## 2018-02-28 DIAGNOSIS — D582 Other hemoglobinopathies: Secondary | ICD-10-CM | POA: Insufficient documentation

## 2018-02-28 DIAGNOSIS — I130 Hypertensive heart and chronic kidney disease with heart failure and stage 1 through stage 4 chronic kidney disease, or unspecified chronic kidney disease: Secondary | ICD-10-CM | POA: Diagnosis not present

## 2018-02-28 DIAGNOSIS — E1122 Type 2 diabetes mellitus with diabetic chronic kidney disease: Secondary | ICD-10-CM | POA: Insufficient documentation

## 2018-02-28 DIAGNOSIS — Z950 Presence of cardiac pacemaker: Secondary | ICD-10-CM | POA: Insufficient documentation

## 2018-02-28 DIAGNOSIS — I480 Paroxysmal atrial fibrillation: Secondary | ICD-10-CM | POA: Diagnosis not present

## 2018-02-28 DIAGNOSIS — E785 Hyperlipidemia, unspecified: Secondary | ICD-10-CM | POA: Insufficient documentation

## 2018-02-28 DIAGNOSIS — K219 Gastro-esophageal reflux disease without esophagitis: Secondary | ICD-10-CM | POA: Diagnosis not present

## 2018-02-28 DIAGNOSIS — N183 Chronic kidney disease, stage 3 (moderate): Secondary | ICD-10-CM | POA: Insufficient documentation

## 2018-02-28 DIAGNOSIS — Z87891 Personal history of nicotine dependence: Secondary | ICD-10-CM | POA: Insufficient documentation

## 2018-02-28 DIAGNOSIS — Z79899 Other long term (current) drug therapy: Secondary | ICD-10-CM

## 2018-02-28 LAB — CBC WITH DIFFERENTIAL/PLATELET
Basophils Absolute: 0 10*3/uL (ref 0–0.1)
Basophils Relative: 0 %
Eosinophils Absolute: 0.1 10*3/uL (ref 0–0.7)
Eosinophils Relative: 2 %
HCT: 33.5 % — ABNORMAL LOW (ref 35.0–47.0)
Hemoglobin: 11.3 g/dL — ABNORMAL LOW (ref 12.0–16.0)
Lymphocytes Relative: 23 %
Lymphs Abs: 1.6 10*3/uL (ref 1.0–3.6)
MCH: 26.3 pg (ref 26.0–34.0)
MCHC: 33.7 g/dL (ref 32.0–36.0)
MCV: 78.2 fL — ABNORMAL LOW (ref 80.0–100.0)
Monocytes Absolute: 0.6 10*3/uL (ref 0.2–0.9)
Monocytes Relative: 9 %
Neutro Abs: 4.6 10*3/uL (ref 1.4–6.5)
Neutrophils Relative %: 66 %
Platelets: 170 10*3/uL (ref 150–440)
RBC: 4.29 MIL/uL (ref 3.80–5.20)
RDW: 14.5 % (ref 11.5–14.5)
WBC: 7 10*3/uL (ref 3.6–11.0)

## 2018-02-28 LAB — COMPREHENSIVE METABOLIC PANEL
ALT: 17 U/L (ref 14–54)
AST: 20 U/L (ref 15–41)
Albumin: 3.6 g/dL (ref 3.5–5.0)
Alkaline Phosphatase: 72 U/L (ref 38–126)
Anion gap: 8 (ref 5–15)
BUN: 21 mg/dL — ABNORMAL HIGH (ref 6–20)
CO2: 24 mmol/L (ref 22–32)
Calcium: 8.8 mg/dL — ABNORMAL LOW (ref 8.9–10.3)
Chloride: 107 mmol/L (ref 101–111)
Creatinine, Ser: 1.42 mg/dL — ABNORMAL HIGH (ref 0.44–1.00)
GFR calc Af Amer: 39 mL/min — ABNORMAL LOW (ref >60)
GFR calc non Af Amer: 34 mL/min — ABNORMAL LOW (ref >60)
Glucose, Bld: 114 mg/dL — ABNORMAL HIGH (ref 65–99)
Potassium: 3.6 mmol/L (ref 3.5–5.1)
Sodium: 139 mmol/L (ref 135–145)
Total Bilirubin: 0.7 mg/dL (ref 0.3–1.2)
Total Protein: 6.7 g/dL (ref 6.5–8.1)

## 2018-02-28 LAB — IRON AND TIBC
Iron: 43 ug/dL (ref 28–170)
Saturation Ratios: 18 % (ref 10.4–31.8)
TIBC: 237 ug/dL — ABNORMAL LOW (ref 250–450)
UIBC: 194 ug/dL

## 2018-02-28 LAB — FERRITIN: Ferritin: 116 ng/mL (ref 11–307)

## 2018-02-28 NOTE — Progress Notes (Signed)
New changes noted today

## 2018-03-04 NOTE — Progress Notes (Signed)
Hematology/Oncology Consult note [facility]  Telephone:([phone] [phone]  Patient Care Team: Dion Body, MD as PCP - General (Family Medicine) Minna Merritts, MD as Consulting Physician (Cardiology) Anthonette Legato, MD as Consulting Physician (Nephrology) Proch Lance, MD as Consulting Physician (Cardiology) Lorelee Cover., MD (Ophthalmology) Edrick Kins, DPM as Consulting Physician (Podiatry)   Name of the patient: [name] [name]  [id number]  09-28-1935   Date of visit: 03/04/18  Diagnosis- 1. Iron deficiency anemia 2. Hemoglobin C trait  Chief complaint/ Reason for visit- routine f/u of anemia  Heme/Onc history: patient is a 83 year old female with a past medical history significant for hypertension and stage III chronic kidney disease among other medical problems. She also has hypertension hyperlipidemia and type 2 diabetes. She was recently found by Dr. Clayton Bibles and was noted to have a hemoglobin of 9.4. Iron studies also revealed a ferritin of 22, iron saturation 13%. TIBC was normal at 302 on serum iron was low normal at 39 white count and platelets within normal limits. She has been referred to [place] for iron deficiency anemia. She has been seen by Dr. Remi Haggard GI the past and had a colonoscopy in 2017 which did not reveal any evidence of malignancy. 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 Grayland Ormond in September 2017  Patient is taking oral iron once a day for over a month now and tolerating it without any significant side effects. She denies any consistent use of NSAIDs. Denies any bleeding and stools are docked artery stools. Denies any family history of colon cancer  Blood work from 05/24/2017 was as follows: CBC showed white count of 7.8, H&H of 10.2/30.2 with an MCV of 68.1 and a platelet count of  199. Ferritin levels were low at 14. Vitamin B12 is worse normal at 947. Folate was normal. Haptoglobin and TSH was normal. Iron studies were within normal limits. ESR was mildly elevated at 56. Multiple myeloma panel did not reveal any monoclonal protein. H. pylori stool antigen was negative.   Interval history- she feels well. Denies any complaints today. No fatigue, fever, unintentional weight loss  ECOG PS- 1 Pain scale- 0   Review of systems- Review of Systems  Constitutional: Negative for chills, fever, malaise/fatigue and weight loss.  HENT: Negative for congestion, ear discharge and nosebleeds.   Eyes: Negative for blurred vision.  Respiratory: Negative for cough, hemoptysis, sputum production, shortness of breath and wheezing.   Cardiovascular: Negative for chest pain, palpitations, orthopnea and claudication.  Gastrointestinal: Negative for abdominal pain, blood in stool, constipation, diarrhea, heartburn, melena, nausea and vomiting.  Genitourinary: Negative for dysuria, flank pain, frequency, hematuria and urgency.  Musculoskeletal: Negative for back pain, joint pain and myalgias.  Skin: Negative for rash.  Neurological: Negative for dizziness, tingling, focal weakness, seizures, weakness and headaches.  Endo/Heme/Allergies: Does not bruise/bleed easily.  Psychiatric/Behavioral: Negative for depression and suicidal ideas. The patient does not have insomnia.      Allergies  Allergen Reactions  . Metoprolol Other (See Comments)    Bad dreams Patient stated that she had suicidal thoughts while taking this medication.  . Diltiazem Other (See Comments)    Patient stated that she had bad dreams with this medication  . Levofloxacin Nausea And Vomiting     Past Medical History:  Diagnosis Date  . 1st degree AV block    a.  PR 400 msec with Apacing  . Anemia   . Anxiety   . Arthritis    a. shoulders.  . Chest pain    a. 2004 Cath:  reportedly nl;  b. 09/2013 Neg MV.  .  Chronic diastolic heart failure ([place])    a. 09/2013 Echo: EF 50-55%, mild LVH, mild to mod MR/TR.  Marland Kitchen Chronic kidney disease   . Congestive heart disease ([place])   . Esophageal reflux   . H/O hiatal hernia   . History of UTI   . Hyperlipidemia   . Morbid obesity ([place])   . OSA on CPAP   . PAF (paroxysmal atrial fibrillation) ([place])   . SSS (sick sinus syndrome) ([place])    a. 2008 s/p MDT PPM;  b. 11/2013 Gen change- MDT ADDRL1 Ada[place] DC PPM, ser # [id number] H.  . Symptomatic PVCs   . Syncope and collapse    a. Felt to be vasovagal.  . Thyroid disease    on no meds per pt  . Type II diabetes mellitus ([place])   . Unspecified essential hypertension   . Unspecified glaucoma(365.9)      Past Surgical History:  Procedure Laterality Date  . APPENDECTOMY    . CARDIOVERSION N/A 04/26/2017   Procedure: Cardioversion;  Surgeon: Minna Merritts, MD;  Location: [facility] ORS;  Service: Cardiovascular;  Laterality: N/A;  . DILATION AND CURETTAGE OF UTERUS    . INCISION AND DRAINAGE OF WOUND Right 03/1998   "leg; it was like a boil" (12/01/2013)  . INSERT / REPLACE / REMOVE PACEMAKER  2008; 12/01/2013   MDT ADDRL1 pacemaker - gen change by Dr Caryl Comes 12/01/2013  . LUNG BIOPSY  2010   [facility]  . PERMANENT PACEMAKER GENERATOR CHANGE N/A 12/01/2013   Procedure: PERMANENT PACEMAKER GENERATOR CHANGE;  Surgeon: Deboraha Sprang, MD;  Location: [facility] CATH LAB;  Service: Cardiovascular;  Laterality: N/A;  . VAGINAL HYSTERECTOMY  1970    Social History   Socioeconomic History  . Marital status: Widowed    Spouse name: Not on file  . Number of children: Y  . Years of education: Not on file  . Highest education level: Not on file  Occupational History  . Occupation: retired  Scientific laboratory technician  . Financial resource strain: Not on file  . Food insecurity:    Worry: Not on file    Inability: Not on file  . Transportation needs:    Medical: Not on file    Non-medical: Not on file  Tobacco Use  . Smoking status:  Former Smoker    Packs/day: 0.50    Years: 34.00    Pack years: 17.00    Types: Cigarettes    Last attempt to quit: 08/15/1989    Years since quitting: 28.5  . Smokeless tobacco: Never Used  Substance and Sexual Activity  . Alcohol use: No    Alcohol/week: 0.0 oz  . Drug use: No  . Sexual activity: Never  Lifestyle  . Physical activity:    Days per week: Not on file    Minutes per session: Not on file  . Stress: Not on file  Relationships  . Social connections:    Talks on phone: Not on file    Gets together: Not on file    Attends religious service: Not on file    Active member of club or organization: Not on file    Attends meetings of clubs or organizations: Not on file    Relationship status: Not on file  .  Intimate partner violence:    Fear of current or ex partner: Not on file    Emotionally abused: Not on file    Physically abused: Not on file    Forced sexual activity: Not on file  Other Topics Concern  . Not on file  Social History Narrative   Retired. Widowed. Regularly exercises.     Family History  Problem Relation Age of Onset  . Heart disease Father   . Rheum arthritis Father   . Allergies Father   . Liver cancer Mother   . Pancreatic cancer Mother   . Hypertension Sister   . Rheum arthritis Sister   . Breast cancer Maternal Aunt      Current Outpatient Medications:  .  ACCU-CHEK SOFTCLIX LANCETS lancets, as directed. Check CBG's fasting once daily. Dx: E11.22, N18.3, Disp: , Rfl:  .  acetaminophen (TYLENOL) 500 MG tablet, Take 1,000 mg by mouth every 6 (six) hours as needed for moderate pain. , Disp: , Rfl:  .  amLODipine (NORVASC) 10 MG tablet, TAKE 1 TABLET (10 MG TOTAL) BY MOUTH DAILY., Disp: 90 tablet, Rfl: 0 .  atorvastatin (LIPITOR) 20 MG tablet, TAKE 1 TABLET (20 MG TOTAL) BY MOUTH DAILY. (Patient taking differently: Take 20 mg by mouth at bedtime. ), Disp: 90 tablet, Rfl: 0 .  carvedilol (COREG) 12.5 MG tablet, TAKE 1 TABLET BY MOUTH IN THE  MORNING WITH BREAKFAST AND 2 TABLETS IN THE EVENING BEFORE BED, Disp: 90 tablet, Rfl: 3 .  colchicine 0.6 MG tablet, as needed. Take 1.66ms at first sign of gout flare, wait one hour then take 0.663mas needed for gout, Disp: , Rfl:  .  ferrous sulfate 325 (65 FE) MG EC tablet, Take 325 mg by mouth daily., Disp: , Rfl:  .  flecainide (TAMBOCOR) 100 MG tablet, TAKE 1 TABLET BY MOUTH TWICE A DAY, Disp: 60 tablet, Rfl: 3 .  furosemide (LASIX) 20 MG tablet, Take 1 tablet (20 mg total) by mouth daily. (Patient taking differently: Take 40 mg by mouth daily. May take an additional 2059m6 hours after first dose as needed for swelling), Disp: 90 tablet, Rfl: 0 .  glipiZIDE (GLUCOTROL) 10 MG tablet, TAKE 1 TABLET (10 MG TOTAL) BY MOUTH 2 (TWO) TIMES DAILY BEFORE A MEAL., Disp: 180 tablet, Rfl: 0 .  metFORMIN (GLUCOPHAGE-XR) 500 MG 24 hr tablet, TAKE 1 TABLET (500 MG TOTAL) BY MOUTH 2 (TWO) TIMES DAILY., Disp: 180 tablet, Rfl: 0 .  mometasone (ELOCON) 0.1 % cream, Apply topically daily. Apply to affected area as directed two times a day, Disp: 45 g, Rfl: 0 .  Multiple Vitamins-Minerals (CENTRUM SILVER PO), Take 1 tablet by mouth daily. , Disp: , Rfl:  .  olmesartan (BENICAR) 40 MG tablet, TAKE 1 TABLET (40 MG TOTAL) BY MOUTH DAILY., Disp: 90 tablet, Rfl: 0 .  TRADJENTA 5 MG TABS tablet, TAKE 1 TABLET (5 MG TOTAL) BY MOUTH DAILY. (Patient taking differently: TAKE 1 TABLET (5 MG TOTAL) BY MOUTH DAILY WITH BREAKFAST), Disp: 30 tablet, Rfl: 2 .  travoprost, benzalkonium, (TRAVATAN) 0.004 % ophthalmic solution, Place 1 drop into both eyes at bedtime. , Disp: , Rfl:  .  warfarin (COUMADIN) 3 MG tablet, TAKE 1-1.5 TABLETS DAILY AT 6 PM. TAKE 1 TAB ON TUES, THURS, SAT, SUN. TAKE 4.5 MG ON MON, WED, FRI (Patient taking differently: TAKE 3MG DAILY ON TUES, THURS, SAT, SUN. TAKE 4.5 MG DAILY ON MON, WED, FRI AT 6PM), Disp: 100 tablet, Rfl: 3 .  Brinzolamide-Brimonidine (SIMBRINZA) 1-0.2 % SUSP, Place 1 drop into both eyes 2  (two) times daily. (Patient not taking: Reported on 02/28/2018), Disp: 1 Bottle, Rfl: 1 .  esomeprazole (NEXIUM) 40 MG capsule, TAKE 1 CAPSULE (40 MG TOTAL) BY MOUTH DAILY. (Patient not taking: Reported on 02/28/2018), Disp: 90 capsule, Rfl: 1 .  Glycerin, Laxative, (FLEET LIQUID GLYCERIN SUPP RE), Place 1 Dose rectally daily as needed (constipation)., Disp: , Rfl:  .  Menthol, Topical Analgesic, (ICY HOT EX), Apply 1 application topically daily as needed (pain)., Disp: , Rfl:  .  polyethylene glycol (MIRALAX / GLYCOLAX) packet, Take 17 g by mouth daily as needed for moderate constipation., Disp: , Rfl:   Physical exam:  Vitals:   02/28/18 1448  BP: 119/73  Pulse: 68  Resp: 18  Temp: 97.8 F (36.6 C)  TempSrc: Tympanic  SpO2: 97%  Weight: 244 lb 11.2 oz (111 kg)  Height: _0  (1.626 m)   Physical Exam  Constitutional: She is oriented to person, place, and time and well-developed, well-nourished, and in no distress.  HENT:  Head: Normocephalic and atraumatic.  Eyes: Pupils are equal, round, and reactive to light. EOM are normal.  Neck: Normal range of motion.  Cardiovascular: Normal rate, regular rhythm and normal heart sounds.  Pulmonary/Chest: Effort normal and breath sounds normal.  Abdominal: Soft. Bowel sounds are normal.  Neurological: She is alert and oriented to person, place, and time.  Skin: Skin is warm and dry.     CMP Latest Ref Rng & Units 02/28/2018  Glucose 65 - 99 mg/dL 114(H)  BUN 6 - 20 mg/dL 21(H)  Creatinine 0.44 - 1.00 mg/dL 1.42(H)  Sodium 135 - 145 mmol/L 139  Potassium 3.5 - 5.1 mmol/L 3.6  Chloride 101 - 111 mmol/L 107  CO2 22 - 32 mmol/L 24  Calcium 8.9 - 10.3 mg/dL 8.8(L)  Total Protein 6.5 - 8.1 g/dL 6.7  Total Bilirubin 0.3 - 1.2 mg/dL 0.7  Alkaline Phos 38 - 126 U/L 72  AST 15 - 41 U/L 20  ALT 14 - 54 U/L 17   CBC Latest Ref Rng & Units 02/28/2018  WBC 3.6 - 11.0 K/uL 7.0  Hemoglobin 12.0 - 16.0 g/dL 11.3(L)  Hematocrit 35.0 - 47.0 %  33.5(L)  Platelets 150 - 440 K/uL 170      Assessment and plan- Patient is a 83 y.o. female with iron deficiency anemia and heterozygous for hemoglobin C disease  1. Irond eficiency anemia: iron studies are normal today and H/H is at baseline around 11. No need for IV iron today  2. Microcytosis: secondary to Hemoglobin C. Her mild anemia is also likely due to the same   Repeat cbc, ferritin and iron studies in 3 and 6 months. See md in 6 months   Visit Diagnosis 1. Iron deficiency anemia, unspecified iron deficiency anemia type   2. Hemoglobin C disease ([place])      Dr. Randa Evens, MD, MPH [facility] Pager- [phone] 03/04/2018 8:16 AM

## 2018-03-05 ENCOUNTER — Ambulatory Visit (INDEPENDENT_AMBULATORY_CARE_PROVIDER_SITE_OTHER): Payer: Medicare Other | Admitting: *Deleted

## 2018-03-05 DIAGNOSIS — I495 Sick sinus syndrome: Secondary | ICD-10-CM | POA: Diagnosis not present

## 2018-03-05 NOTE — Progress Notes (Signed)
Remote pacemaker transmission.   

## 2018-03-06 ENCOUNTER — Encounter: Payer: Self-pay | Admitting: Cardiology

## 2018-03-06 ENCOUNTER — Other Ambulatory Visit: Payer: Self-pay | Admitting: Cardiovascular Disease

## 2018-03-07 ENCOUNTER — Other Ambulatory Visit: Payer: Self-pay | Admitting: Cardiovascular Disease

## 2018-03-08 ENCOUNTER — Telehealth: Payer: Self-pay | Admitting: Cardiovascular Disease

## 2018-03-08 ENCOUNTER — Other Ambulatory Visit: Payer: Self-pay

## 2018-03-08 LAB — CUP PACEART REMOTE DEVICE CHECK
Battery Impedance: 204 Ohm
Battery Remaining Longevity: 119 mo
Battery Voltage: 2.79 V
Brady Statistic AP VP Percent: 1 %
Brady Statistic AP VS Percent: 99 %
Brady Statistic AS VP Percent: 0 %
Brady Statistic AS VS Percent: 0 %
Date Time Interrogation Session: 20190402123932
Implantable Lead Implant Date: 20080117
Implantable Lead Implant Date: 20080117
Implantable Lead Location: 753859
Implantable Lead Location: 753860
Implantable Lead Model: 5076
Implantable Lead Model: 5076
Implantable Pulse Generator Implant Date: 20141229
Lead Channel Impedance Value: 434 Ohm
Lead Channel Impedance Value: 697 Ohm
Lead Channel Pacing Threshold Amplitude: 0.625 V
Lead Channel Pacing Threshold Amplitude: 1.25 V
Lead Channel Pacing Threshold Pulse Width: 0.4 ms
Lead Channel Pacing Threshold Pulse Width: 0.4 ms
Lead Channel Setting Pacing Amplitude: 2 V
Lead Channel Setting Pacing Amplitude: 2.5 V
Lead Channel Setting Pacing Pulse Width: 0.4 ms
Lead Channel Setting Sensing Sensitivity: 4 mV

## 2018-03-08 MED ORDER — FLECAINIDE ACETATE 100 MG PO TABS: 100.0000 mg | ORAL_TABLET | Freq: Two times a day (BID) | ORAL | 0 refills | Status: DC

## 2018-03-08 NOTE — Telephone Encounter (Signed)
Refill sent.   flecainide (TAMBOCOR) 100 MG tablet 180 tablet 0 03/08/2018    Sig - Route: Take 1 tablet (100 mg total) by mouth 2 (two) times daily. - Oral   Sent to pharmacy as: flecainide (TAMBOCOR) 100 MG tablet   E-Prescribing Status: Receipt confirmed by pharmacy (03/08/2018 8:58 AM EDT)   Pharmacy   [id number] [address], [place]

## 2018-03-08 NOTE — Telephone Encounter (Signed)
°*  STAT* If patient is at the pharmacy, call can be transferred to refill team.   1. Which medications need to be refilled? (please list name of each medication and dose if known)  flecainide (TAMBOCOR) 100 MG, states she takes 1 in the morning and 2 at night   2. Which pharmacy/location (including street and city if local pharmacy) is medication to be sent to? [facility] [address] [facility] or 90 day supply? 90 day

## 2018-05-31 ENCOUNTER — Inpatient Hospital Stay: Payer: Medicare Other | Attending: Oncology

## 2018-05-31 DIAGNOSIS — K219 Gastro-esophageal reflux disease without esophagitis: Secondary | ICD-10-CM | POA: Insufficient documentation

## 2018-05-31 DIAGNOSIS — Z79899 Other long term (current) drug therapy: Secondary | ICD-10-CM | POA: Diagnosis not present

## 2018-05-31 DIAGNOSIS — Z950 Presence of cardiac pacemaker: Secondary | ICD-10-CM | POA: Insufficient documentation

## 2018-05-31 DIAGNOSIS — I130 Hypertensive heart and chronic kidney disease with heart failure and stage 1 through stage 4 chronic kidney disease, or unspecified chronic kidney disease: Secondary | ICD-10-CM | POA: Diagnosis not present

## 2018-05-31 DIAGNOSIS — N183 Chronic kidney disease, stage 3 (moderate): Secondary | ICD-10-CM | POA: Insufficient documentation

## 2018-05-31 DIAGNOSIS — D582 Other hemoglobinopathies: Secondary | ICD-10-CM | POA: Insufficient documentation

## 2018-05-31 DIAGNOSIS — Z87891 Personal history of nicotine dependence: Secondary | ICD-10-CM | POA: Diagnosis not present

## 2018-05-31 DIAGNOSIS — E1122 Type 2 diabetes mellitus with diabetic chronic kidney disease: Secondary | ICD-10-CM | POA: Diagnosis not present

## 2018-05-31 DIAGNOSIS — E785 Hyperlipidemia, unspecified: Secondary | ICD-10-CM | POA: Insufficient documentation

## 2018-05-31 DIAGNOSIS — D509 Iron deficiency anemia, unspecified: Secondary | ICD-10-CM

## 2018-05-31 DIAGNOSIS — I480 Paroxysmal atrial fibrillation: Secondary | ICD-10-CM | POA: Insufficient documentation

## 2018-05-31 LAB — CBC
HCT: 35 % (ref 35.0–47.0)
Hemoglobin: 11.9 g/dL — ABNORMAL LOW (ref 12.0–16.0)
MCH: 26.2 pg (ref 26.0–34.0)
MCHC: 34 g/dL (ref 32.0–36.0)
MCV: 77.1 fL — ABNORMAL LOW (ref 80.0–100.0)
Platelets: 190 10*3/uL (ref 150–440)
RBC: 4.54 MIL/uL (ref 3.80–5.20)
RDW: 14.5 % (ref 11.5–14.5)
WBC: 10 10*3/uL (ref 3.6–11.0)

## 2018-05-31 LAB — IRON AND TIBC
Iron: 66 ug/dL (ref 28–170)
Saturation Ratios: 26 % (ref 10.4–31.8)
TIBC: 255 ug/dL (ref 250–450)
UIBC: 189 ug/dL

## 2018-05-31 LAB — FERRITIN: Ferritin: 94 ng/mL (ref 11–307)

## 2018-06-03 ENCOUNTER — Telehealth: Payer: Self-pay | Admitting: Cardiovascular Disease

## 2018-06-03 NOTE — Telephone Encounter (Signed)
Patient reports that the last 3-4 months she has been having afib. She also states that she wakes up with heart burn and rapid heart rate. She then gets up and takes some tums which helps that discomfort go away. She also goes to sleep in the recliner. She denies any shortness of breath but does feel tired and does have chest pain intermittently. The dates she has had chest tightness have been June 8th, June 12th, June 17th, June 20th. She reports taking extra carvedilol and that sometimes it helps and other times it doesn't. She states that it comes and goes. Rapid heart beat and palpitations unusually happen at night around 2-3 AM.   She reports that she had a 5 pound weight gain from the fluid but that it is now trending back down. Most recent blood pressure was from 6/28 1130 pm 115/55. She denies any chest pain or shortness of breath at this time. Advised that I would send this message over to Dr. Rockey Situ for his review and that I would give her a call back with any further recommendations.

## 2018-06-03 NOTE — Telephone Encounter (Signed)
Pt calling stating she's been having issues with her Afib for about 3 month Its on a off symptoms She will wake up in the morning with a rapid heartbeat  She said it just comes on randomly. She's trying to figure out what could be causing this or something to help control this She states also gained 5 pounds, but thinks it is fluid  She also states she's been experiencing some CP here and there. It more of a tightness in her chest.    Would like some advise on this

## 2018-06-04 ENCOUNTER — Ambulatory Visit (INDEPENDENT_AMBULATORY_CARE_PROVIDER_SITE_OTHER): Payer: Medicare Other | Admitting: *Deleted

## 2018-06-04 DIAGNOSIS — I495 Sick sinus syndrome: Secondary | ICD-10-CM

## 2018-06-04 NOTE — Progress Notes (Signed)
Remote pacemaker transmission.   

## 2018-06-04 NOTE — Telephone Encounter (Signed)
She should be scheduled for pacer download check On last download January through April was not having much atrial fibrillation This may have changed and we can track it on new download Can we try to download this sooner rather than later for evaluation

## 2018-06-05 NOTE — Telephone Encounter (Signed)
Spoke with patient and reviewed that report did not identify any afib since January per Dr. Donivan Scull review. She verbalized understanding and stated that it "felt like it" but was not sure. She was very appreciative for the call back with no further questions at this time. Instructed her to call back if her symptoms persist.

## 2018-06-05 NOTE — Telephone Encounter (Signed)
We have a copy thanks!

## 2018-06-05 NOTE — Telephone Encounter (Signed)
I have printed a copy of the 06/04/2018 report and sent to Dr Gwenyth Ober nurse for his review.

## 2018-06-13 LAB — CUP PACEART REMOTE DEVICE CHECK
Battery Impedance: 228 Ohm
Battery Remaining Longevity: 115 mo
Battery Voltage: 2.79 V
Brady Statistic AP VP Percent: 1 %
Brady Statistic AP VS Percent: 98 %
Brady Statistic AS VP Percent: 0 %
Brady Statistic AS VS Percent: 1 %
Date Time Interrogation Session: 20190702132913
Implantable Lead Implant Date: 20080117
Implantable Lead Implant Date: 20080117
Implantable Lead Location: 753859
Implantable Lead Location: 753860
Implantable Lead Model: 5076
Implantable Lead Model: 5076
Implantable Pulse Generator Implant Date: 20141229
Lead Channel Impedance Value: 423 Ohm
Lead Channel Impedance Value: 664 Ohm
Lead Channel Pacing Threshold Amplitude: 0.625 V
Lead Channel Pacing Threshold Amplitude: 1.125 V
Lead Channel Pacing Threshold Pulse Width: 0.4 ms
Lead Channel Pacing Threshold Pulse Width: 0.4 ms
Lead Channel Sensing Intrinsic Amplitude: 11.2 mV
Lead Channel Setting Pacing Amplitude: 2 V
Lead Channel Setting Pacing Amplitude: 2.5 V
Lead Channel Setting Pacing Pulse Width: 0.4 ms
Lead Channel Setting Sensing Sensitivity: 4 mV

## 2018-06-23 ENCOUNTER — Encounter: Payer: Self-pay | Admitting: Internal Medicine

## 2018-06-25 ENCOUNTER — Ambulatory Visit (INDEPENDENT_AMBULATORY_CARE_PROVIDER_SITE_OTHER): Payer: Medicare Other | Admitting: Internal Medicine

## 2018-06-25 ENCOUNTER — Encounter: Payer: Self-pay | Admitting: Internal Medicine

## 2018-06-25 VITALS — BP 124/78 | HR 73 | Resp 16 | Ht 64.0 in | Wt 242.0 lb

## 2018-06-25 DIAGNOSIS — G2581 Restless legs syndrome: Secondary | ICD-10-CM | POA: Diagnosis not present

## 2018-06-25 DIAGNOSIS — G4733 Obstructive sleep apnea (adult) (pediatric): Secondary | ICD-10-CM | POA: Diagnosis not present

## 2018-06-25 NOTE — Patient Instructions (Signed)
Continue with cpap every night, try to use it the whole night.

## 2018-06-25 NOTE — Progress Notes (Signed)
* [facility]     Assessment and Plan:  83 yo female with OSA and atrial fibrillation which has been causing sleep disturbance. AFib is now better controlled and sleep better. She is tolerating cpap well.   OSA --Continue to use CPAP every night.  --auto-set pressure to 10-20.  RLS -Appears adequately controlled at this time. Continue iron supplement.   Insomnia. -Sleep maintenance type, mild, she is off xanax and is doing well.    Date: 06/25/2018  MRN# [id number] [name] [name] 1935/09/12   [name] [name] is a 83 y.o. old female seen in follow up for chief complaint of  Chief Complaint  Patient presents with  . Sleep Apnea     HPI:   Patient is a 83 yo female with history of OSA and RLS,  Afib, SSS, HTN, DM, GERD.  She is here for follow-up of obstructive sleep apnea. She continues to use it every night, and feels that it is helping her, she is more awake during the day, and is no longer snoring.  She used to take xanax to help with sleep, but she has stopped it. She is now able to fall asleep on her own but sometimes has trouble going back to sleep after waking up to urinate.  She remains on iron supplement and her RLS symptoms are well controlled.   **Sleep download data 05/25/2018-06/23/2018>> usage greater than 4 hours is 24/30 days.  Average usage on days used is 5 hours 50 minutes.  Pressure setting is 10-20.  Median pressure is 11, 93rd percentile pressure 12, max pressure 13.  Residual AHI is 1.3.  Overall this shows adequate compliance with CPAP with excellent control of obstructive sleep apnea. Review of download data for the month of April 2018;  Average usage is 6 hours and 23 minutes, AutoSet 10-20. 95th percentile pressure was 12, review of graphic data shows pressures typically between 10 and 13. Leaks are minimal, residual AHI was 1.1.  PSG 10/24/13 >> AHI 7 Auto CPAP 05/10/15 to 06/08/15 >> used on 29 of 30 nights with average 6 hrs and 41 min.   Average AHI is 1.7 with median CPAP 8 cm H2O and 95 th percentile CPAP 12 cm H20.   PMhx >> Paroxymal atrial fibrillation, SSS s/p PM, HTN, Diastolic CHF, HLD, DM, HH, GERD, Glaucoma  Medication:   Outpatient Encounter Medications as of 06/25/2018  Medication Sig  . ACCU-CHEK SOFTCLIX LANCETS lancets as directed. Check CBG's fasting once daily. Dx: E11.22, N18.3  . acetaminophen (TYLENOL) 500 MG tablet Take 1,000 mg by mouth every 6 (six) hours as needed for moderate pain.   Marland Kitchen amLODipine (NORVASC) 10 MG tablet TAKE 1 TABLET (10 MG TOTAL) BY MOUTH DAILY.  Marland Kitchen atorvastatin (LIPITOR) 20 MG tablet TAKE 1 TABLET (20 MG TOTAL) BY MOUTH DAILY. (Patient taking differently: Take 20 mg by mouth at bedtime. )  . Brinzolamide-Brimonidine (SIMBRINZA) 1-0.2 % SUSP Place 1 drop into both eyes 2 (two) times daily.  . carvedilol (COREG) 12.5 MG tablet TAKE 1 TABLET BY MOUTH IN THE MORNING WITH BREAKFAST AND 2 TABLETS IN THE EVENING BEFORE BED  . colchicine 0.6 MG tablet as needed. Take 1.2mg s at first sign of gout flare, wait one hour then take 0.6mg  as needed for gout  . esomeprazole (NEXIUM) 40 MG capsule TAKE 1 CAPSULE (40 MG TOTAL) BY MOUTH DAILY.  . ferrous sulfate 325 (65 FE) MG EC tablet Take 325 mg by mouth daily.  . flecainide (  TAMBOCOR) 100 MG tablet Take 1 tablet (100 mg total) by mouth 2 (two) times daily.  . furosemide (LASIX) 20 MG tablet Take 1 tablet (20 mg total) by mouth daily. (Patient taking differently: Take 40 mg by mouth daily. May take an additional 20mg s 6 hours after first dose as needed for swelling)  . glipiZIDE (GLUCOTROL) 10 MG tablet TAKE 1 TABLET (10 MG TOTAL) BY MOUTH 2 (TWO) TIMES DAILY BEFORE A MEAL.  Marland Kitchen Glycerin, Laxative, (FLEET LIQUID GLYCERIN SUPP RE) Place 1 Dose rectally daily as needed (constipation).  . metFORMIN (GLUCOPHAGE-XR) 500 MG 24 hr tablet TAKE 1 TABLET (500 MG TOTAL) BY MOUTH 2 (TWO) TIMES DAILY.  . mometasone (ELOCON) 0.1 % cream Apply topically daily. Apply  to affected area as directed two times a day  . Multiple Vitamins-Minerals (CENTRUM SILVER PO) Take 1 tablet by mouth daily.   Marland Kitchen olmesartan (BENICAR) 40 MG tablet TAKE 1 TABLET (40 MG TOTAL) BY MOUTH DAILY.  Marland Kitchen polyethylene glycol (MIRALAX / GLYCOLAX) packet Take 17 g by mouth daily as needed for moderate constipation.  . TRADJENTA 5 MG TABS tablet TAKE 1 TABLET (5 MG TOTAL) BY MOUTH DAILY. (Patient taking differently: TAKE 1 TABLET (5 MG TOTAL) BY MOUTH DAILY WITH BREAKFAST)  . travoprost, benzalkonium, (TRAVATAN) 0.004 % ophthalmic solution Place 1 drop into both eyes at bedtime.   Marland Kitchen warfarin (COUMADIN) 3 MG tablet TAKE 1-1.5 TABLETS DAILY AT 6 PM. TAKE 1 TAB ON TUES, THURS, SAT, SUN. TAKE 4.5 MG ON MON, WED, FRI (Patient taking differently: TAKE 3MG  DAILY ON TUES, THURS, SAT, SUN. TAKE 4.5 MG DAILY ON MON, WED, FRI AT 6PM)  . KLOR-CON M10 10 MEQ tablet Take 1 tablet by mouth daily.  . [DISCONTINUED] Menthol, Topical Analgesic, (ICY HOT EX) Apply 1 application topically daily as needed (pain).   No facility-administered encounter medications on file as of 06/25/2018.      Allergies:  Metoprolol; Diltiazem; and Levofloxacin  Review of Systems:  Constitutional: Feels well. Cardiovascular: No chest pain.  Pulmonary: Denies dyspnea.   The remainder of systems were reviewed and were found to be negative other than what is documented in the HPI.    Physical Examination:   VS: BP 124/78 (BP Location: Left Arm, Cuff Size: Large)   Pulse 73   Resp 16   Ht 5\' 4"  (1.626 m)   Wt 242 lb (109.8 kg)   SpO2 99%   BMI 41.54 kg/m   General Appearance: No distress  Neuro:without focal findings, mental status, speech normal, alert and oriented HEENT: PERRLA, EOM intact Pulmonary: No wheezing, No rales  CardiovascularNormal S1,S2.  No m/r/g.  Abdomen: Benign, Soft, non-tender, No masses Renal:  No costovertebral tenderness  GU:  No performed at this time. Endoc: No evident thyromegaly, no signs  of acromegaly or Cushing features Skin:   warm, no rashes, no ecchymosis  Extremities: normal, no cyanosis, clubbing.     LABORATORY PANEL:   CBC No results for input(s): WBC, HGB, HCT, PLT in the last 168 hours. ------------------------------------------------------------------------------------------------------------------  Chemistries  No results for input(s): NA, K, CL, CO2, GLUCOSE, BUN, CREATININE, CALCIUM, MG, AST, ALT, ALKPHOS, BILITOT in the last 168 hours.  Invalid input(s): GFRCGP ------------------------------------------------------------------------------------------------------------------  Cardiac Enzymes No results for input(s): TROPONINI in the last 168 hours. ------------------------------------------------------------  RADIOLOGY:   No results found for this or any previous visit. Results for orders placed during the hospital encounter of 10/26/15  DG Chest 2 View   Narrative CLINICAL DATA:  Cough.  EXAM: CHEST  2 VIEW  COMPARISON:  08/03/2014 .  FINDINGS: Cardiac pacer with lead tip in the right atrium and right ventricle. Cardiomegaly with pulmonary vascular prominence and mild interstitial prominence noted. Pleural effusion or pneumothorax. Degenerative changes thoracic spine with stable mild mid thoracic vertebral body compression fracture.  IMPRESSION: 1. Cardiac pacer stable position. Cardiomegaly with mild pulmonary interstitial prominence. Mild component congestive heart failure cannot be excluded. Mild pneumonitis cannot be excluded. 2. Diffuse degenerative changes and osteopenia thoracic spine. Stable mild mid thoracic vertebral body compression fracture.   Electronically Signed   By: Marcello Moores  Register   On: 10/26/2015 14:54    ------------------------------------------------------------------------------------------------------------------  Thank  you for allowing [facility] to assist in the care of your  patient. Our recommendations are noted above.  Please contact us if we can be of further service.   Marda Stalker, M.D., F.C.C.P.  Board Certified in Internal Medicine, Pulmonary Medicine, [place], and Sleep Medicine.  [facility] Office Number: [phone]   06/25/2018

## 2018-06-30 ENCOUNTER — Other Ambulatory Visit: Payer: Self-pay | Admitting: Cardiovascular Disease

## 2018-08-03 ENCOUNTER — Other Ambulatory Visit: Payer: Self-pay | Admitting: Cardiovascular Disease

## 2018-08-14 DIAGNOSIS — E042 Nontoxic multinodular goiter: Secondary | ICD-10-CM | POA: Insufficient documentation

## 2018-08-30 ENCOUNTER — Inpatient Hospital Stay: Payer: Medicare HMO

## 2018-08-30 ENCOUNTER — Inpatient Hospital Stay: Payer: Medicare HMO | Attending: Oncology | Admitting: Oncology

## 2018-08-30 VITALS — BP 108/70 | HR 87 | Temp 98.5°F | Resp 18 | Ht 64.0 in | Wt 245.0 lb

## 2018-08-30 DIAGNOSIS — E1122 Type 2 diabetes mellitus with diabetic chronic kidney disease: Secondary | ICD-10-CM | POA: Diagnosis not present

## 2018-08-30 DIAGNOSIS — D582 Other hemoglobinopathies: Secondary | ICD-10-CM

## 2018-08-30 DIAGNOSIS — Z803 Family history of malignant neoplasm of breast: Secondary | ICD-10-CM

## 2018-08-30 DIAGNOSIS — D509 Iron deficiency anemia, unspecified: Secondary | ICD-10-CM

## 2018-08-30 DIAGNOSIS — I130 Hypertensive heart and chronic kidney disease with heart failure and stage 1 through stage 4 chronic kidney disease, or unspecified chronic kidney disease: Secondary | ICD-10-CM

## 2018-08-30 DIAGNOSIS — N183 Chronic kidney disease, stage 3 (moderate): Secondary | ICD-10-CM

## 2018-08-30 DIAGNOSIS — Z87891 Personal history of nicotine dependence: Secondary | ICD-10-CM

## 2018-08-30 DIAGNOSIS — Z800 Family history of malignant neoplasm of digestive organs: Secondary | ICD-10-CM

## 2018-08-30 DIAGNOSIS — Z79899 Other long term (current) drug therapy: Secondary | ICD-10-CM | POA: Diagnosis not present

## 2018-08-30 LAB — CBC
HCT: 33.4 % — ABNORMAL LOW (ref 35.0–47.0)
Hemoglobin: 11.2 g/dL — ABNORMAL LOW (ref 12.0–16.0)
MCH: 26.2 pg (ref 26.0–34.0)
MCHC: 33.6 g/dL (ref 32.0–36.0)
MCV: 77.9 fL — ABNORMAL LOW (ref 80.0–100.0)
Platelets: 183 10*3/uL (ref 150–440)
RBC: 4.29 MIL/uL (ref 3.80–5.20)
RDW: 14.4 % (ref 11.5–14.5)
WBC: 7.6 10*3/uL (ref 3.6–11.0)

## 2018-08-30 LAB — FERRITIN: Ferritin: 108 ng/mL (ref 11–307)

## 2018-08-30 LAB — IRON AND TIBC
Iron: 41 ug/dL (ref 28–170)
Saturation Ratios: 17 % (ref 10.4–31.8)
TIBC: 248 ug/dL — ABNORMAL LOW (ref 250–450)
UIBC: 207 ug/dL

## 2018-08-30 NOTE — Progress Notes (Signed)
No new changes noted today 

## 2018-09-02 ENCOUNTER — Encounter: Payer: Self-pay | Admitting: Oncology

## 2018-09-02 ENCOUNTER — Telehealth: Payer: Self-pay | Admitting: Cardiology

## 2018-09-02 NOTE — Telephone Encounter (Signed)
LMOVM for pt to return call in regards to releasing her information to [facility] cardiology.

## 2018-09-02 NOTE — Progress Notes (Signed)
Hematology/Oncology Consult note [facility]  Telephone:([phone] [phone]  Patient Care Team: Dion Body, MD as PCP - General (Family Medicine) Minna Merritts, MD as Consulting Physician (Cardiology) Anthonette Legato, MD as Consulting Physician (Nephrology) Sypher Lance, MD as Consulting Physician (Cardiology) Lorelee Cover., MD (Ophthalmology) Edrick Kins, DPM as Consulting Physician (Podiatry)   Name of the patient: [name] [name]  [id number]  11/16/1935   Date of visit: 09/02/18  Diagnosis- 1. Iron deficiency anemia 2. Hemoglobin C trait  Chief complaint/ Reason for visit-routine follow-up of iron deficiency anemia  Heme/Onc history: patient is a 83 year old female with a past medical history significant for hypertension and stage III chronic kidney disease among other medical problems. She also has hypertension hyperlipidemia and type 2 diabetes. She was recently found by Dr. Clayton Bibles and was noted to have a hemoglobin of 9.4. Iron studies also revealed a ferritin of 22, iron saturation 13%. TIBC was normal at 302 on serum iron was low normal at 39 white count and platelets within normal limits. She has been referred to [place] for iron deficiency anemia. She has been seen by Dr. Remi Haggard GI the past and had a colonoscopy in 2017 which did not reveal any evidence of malignancy. 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 Grayland Ormond in September 2017  Patient is taking oral iron once a day for over a month now and tolerating it without any significant side effects. She denies any consistent use of NSAIDs. Denies any bleeding and stools are docked artery stools. Denies any family history of colon cancer  Blood work from 05/24/2017 was as follows: CBC showed white count of 7.8, H&H of 10.2/30.2 with an MCV of 68.1 and a  platelet count of 199. Ferritin levels were low at 14. Vitamin B12 is worse normal at 947. Folate was normal. Haptoglobin and TSH was normal. Iron studies were within normal limits. ESR was mildly elevated at 56. Multiple myeloma panel did not reveal any monoclonal protein. H. pylori stool antigen was negative.  Interval history-overall patient reports doing well.  She has mild fatigue which is at her baseline.  Denies any blood in her stool or urine.  Denies any melanotic stools.  Her appetite is good and she denies any unintentional weight loss ECOG PS- 1 Pain scale- 0 Opioid associated constipation- no  Review of systems- Review of Systems  Constitutional: Positive for malaise/fatigue. Negative for chills, fever and weight loss.  HENT: Negative for congestion, ear discharge and nosebleeds.   Eyes: Negative for blurred vision.  Respiratory: Negative for cough, hemoptysis, sputum production, shortness of breath and wheezing.   Cardiovascular: Negative for chest pain, palpitations, orthopnea and claudication.  Gastrointestinal: Negative for abdominal pain, blood in stool, constipation, diarrhea, heartburn, melena, nausea and vomiting.  Genitourinary: Negative for dysuria, flank pain, frequency, hematuria and urgency.  Musculoskeletal: Negative for back pain, joint pain and myalgias.  Skin: Negative for rash.  Neurological: Negative for dizziness, tingling, focal weakness, seizures, weakness and headaches.  Endo/Heme/Allergies: Does not bruise/bleed easily.  Psychiatric/Behavioral: Negative for depression and suicidal ideas. The patient does not have insomnia.       Allergies  Allergen Reactions  . Metoprolol Other (See Comments)    Bad dreams Patient stated that she had suicidal thoughts while taking this medication. Other reaction(s): Other (See Comments) Other reaction(s): Other (See Comments) Bad dreams Patient  stated that she had suicidal thoughts while taking this medication. Bad  dreams Patient stated that she had suicidal thoughts while taking this medication. Bad dreams Patient stated that she had suicidal thoughts while taking this medication. Other reaction(s): Other (See Comments) Bad dreams   . Diltiazem Other (See Comments)    Patient stated that she had bad dreams with this medication Other reaction(s): Other (See Comments) Patient stated that she had bad dreams with this medication Other reaction(s): Other (See Comments) Patient stated that she had bad dreams with this medication Patient stated that she had bad dreams with this medication Patient stated that she had bad dreams with this medication  . Levofloxacin Nausea And Vomiting and Nausea Only    Other reaction(s): Nausea And Vomiting     Past Medical History:  Diagnosis Date  . 1st degree AV block    a. PR 400 msec with Apacing  . Anemia   . Anxiety   . Arthritis    a. shoulders.  . Chest pain    a. 2004 Cath:  reportedly nl;  b. 09/2013 Neg MV.  . Chronic diastolic heart failure ([place])    a. 09/2013 Echo: EF 50-55%, mild LVH, mild to mod MR/TR.  Marland Kitchen Chronic kidney disease   . Congestive heart disease ([place])   . Esophageal reflux   . H/O hiatal hernia   . History of UTI   . Hyperlipidemia   . Morbid obesity ([place])   . OSA on CPAP   . PAF (paroxysmal atrial fibrillation) ([place])   . SSS (sick sinus syndrome) ([place])    a. 2008 s/p MDT PPM;  b. 11/2013 Gen change- MDT ADDRL1 Adapta DC PPM, ser # [id number].  . Symptomatic PVCs   . Syncope and collapse    a. Felt to be vasovagal.  . Thyroid disease    on no meds per pt  . Type II diabetes mellitus ([place])   . Unspecified essential hypertension   . Unspecified glaucoma(365.9)      Past Surgical History:  Procedure Laterality Date  . APPENDECTOMY    . CARDIOVERSION N/A 04/26/2017   Procedure: Cardioversion;  Surgeon: Minna Merritts, MD;  Location: [facility] ORS;  Service: Cardiovascular;  Laterality: N/A;  . DILATION AND CURETTAGE OF  UTERUS    . INCISION AND DRAINAGE OF WOUND Right 03/1998   "leg; it was like a boil" (12/01/2013)  . INSERT / REPLACE / REMOVE PACEMAKER  2008; 12/01/2013   MDT ADDRL1 pacemaker - gen change by Dr Caryl Comes 12/01/2013  . LUNG BIOPSY  2010   [facility]  . PERMANENT PACEMAKER GENERATOR CHANGE N/A 12/01/2013   Procedure: PERMANENT PACEMAKER GENERATOR CHANGE;  Surgeon: Deboraha Sprang, MD;  Location: [facility] CATH LAB;  Service: Cardiovascular;  Laterality: N/A;  . VAGINAL HYSTERECTOMY  1970    Social History   Socioeconomic History  . Marital status: Widowed    Spouse name: Not on file  . Number of children: Y  . Years of education: Not on file  . Highest education level: Not on file  Occupational History  . Occupation: retired  Scientific laboratory technician  . Financial resource strain: Not on file  . Food insecurity:    Worry: Not on file    Inability: Not on file  . Transportation needs:    Medical: Not on file    Non-medical: Not on file  Tobacco Use  . Smoking status: Former Smoker    Packs/day: 0.50    Years: 34.00  Pack years: 17.00    Types: Cigarettes    Last attempt to quit: 08/15/1989    Years since quitting: 29.0  . Smokeless tobacco: Never Used  Substance and Sexual Activity  . Alcohol use: No    Alcohol/week: 0.0 standard drinks  . Drug use: No  . Sexual activity: Never  Lifestyle  . Physical activity:    Days per week: Not on file    Minutes per session: Not on file  . Stress: Not on file  Relationships  . Social connections:    Talks on phone: Not on file    Gets together: Not on file    Attends religious service: Not on file    Active member of club or organization: Not on file    Attends meetings of clubs or organizations: Not on file    Relationship status: Not on file  . Intimate partner violence:    Fear of current or ex partner: Not on file    Emotionally abused: Not on file    Physically abused: Not on file    Forced sexual activity: Not on file  Other Topics Concern    . Not on file  Social History Narrative   Retired. Widowed. Regularly exercises.     Family History  Problem Relation Age of Onset  . Heart disease Father   . Rheum arthritis Father   . Allergies Father   . Liver cancer Mother   . Pancreatic cancer Mother   . Hypertension Sister   . Rheum arthritis Sister   . Breast cancer Maternal Aunt      Current Outpatient Medications:  .  ACCU-CHEK SOFTCLIX LANCETS lancets, as directed. Check CBG's fasting once daily. Dx: E11.22, N18.3, Disp: , Rfl:  .  amLODipine (NORVASC) 10 MG tablet, TAKE 1 TABLET (10 MG TOTAL) BY MOUTH DAILY., Disp: 90 tablet, Rfl: 0 .  atorvastatin (LIPITOR) 20 MG tablet, TAKE 1 TABLET (20 MG TOTAL) BY MOUTH DAILY. (Patient taking differently: Take 20 mg by mouth at bedtime. ), Disp: 90 tablet, Rfl: 0 .  carvedilol (COREG) 12.5 MG tablet, TAKE 1 TABLET BY MOUTH IN THE MORNING WITH BREAKFAST AND 2 TABLETS IN THE EVENING BEFORE BED, Disp: 270 tablet, Rfl: 1 .  esomeprazole (NEXIUM) 40 MG capsule, TAKE 1 CAPSULE (40 MG TOTAL) BY MOUTH DAILY., Disp: 90 capsule, Rfl: 1 .  ferrous sulfate 325 (65 FE) MG EC tablet, Take 325 mg by mouth daily., Disp: , Rfl:  .  flecainide (TAMBOCOR) 100 MG tablet, Take 1 tablet (100 mg total) by mouth 2 (two) times daily., Disp: 180 tablet, Rfl: 0 .  furosemide (LASIX) 20 MG tablet, Take 1 tablet (20 mg total) by mouth daily. (Patient taking differently: Take 40 mg by mouth daily. May take an additional 67ms 6 hours after first dose as needed for swelling), Disp: 90 tablet, Rfl: 0 .  glipiZIDE (GLUCOTROL) 10 MG tablet, TAKE 1 TABLET (10 MG TOTAL) BY MOUTH 2 (TWO) TIMES DAILY BEFORE A MEAL., Disp: 180 tablet, Rfl: 0 .  KLOR-CON M10 10 MEQ tablet, Take 1 tablet by mouth daily., Disp: , Rfl: 3 .  metFORMIN (GLUCOPHAGE-XR) 500 MG 24 hr tablet, TAKE 1 TABLET (500 MG TOTAL) BY MOUTH 2 (TWO) TIMES DAILY., Disp: 180 tablet, Rfl: 0 .  mometasone (ELOCON) 0.1 % cream, Apply topically daily. Apply to  affected area as directed two times a day, Disp: 45 g, Rfl: 0 .  Multiple Vitamins-Minerals (CENTRUM SILVER PO), Take 1 tablet by mouth  daily. , Disp: , Rfl:  .  olmesartan (BENICAR) 40 MG tablet, TAKE 1 TABLET (40 MG TOTAL) BY MOUTH DAILY., Disp: 90 tablet, Rfl: 0 .  TRADJENTA 5 MG TABS tablet, TAKE 1 TABLET (5 MG TOTAL) BY MOUTH DAILY. (Patient taking differently: TAKE 1 TABLET (5 MG TOTAL) BY MOUTH DAILY WITH BREAKFAST), Disp: 30 tablet, Rfl: 2 .  travoprost, benzalkonium, (TRAVATAN) 0.004 % ophthalmic solution, Place 1 drop into both eyes at bedtime. , Disp: , Rfl:  .  warfarin (COUMADIN) 3 MG tablet, TAKE 1-1.5 TABLETS DAILY AT 6 PM. TAKE 1 TAB ON TUES, THURS, SAT, SUN. TAKE 4.5 MG ON MON, WED, FRI (Patient taking differently: TAKE 3MG DAILY ON TUES, THURS, SAT, SUN. TAKE 4.5 MG DAILY ON MON, WED, FRI AT 6PM), Disp: 100 tablet, Rfl: 3 .  acetaminophen (TYLENOL) 500 MG tablet, Take 1,000 mg by mouth every 6 (six) hours as needed for moderate pain. , Disp: , Rfl:  .  Brinzolamide-Brimonidine (SIMBRINZA) 1-0.2 % SUSP, Place 1 drop into both eyes 2 (two) times daily. (Patient not taking: Reported on 08/30/2018), Disp: 1 Bottle, Rfl: 1 .  colchicine 0.6 MG tablet, as needed. Take 1.30ms at first sign of gout flare, wait one hour then take 0.674mas needed for gout, Disp: , Rfl:  .  flecainide (TAMBOCOR) 100 MG tablet, TAKE 1 TABLET BY MOUTH TWICE A DAY, Disp: 180 tablet, Rfl: 1 .  Glycerin, Laxative, (FLEET LIQUID GLYCERIN SUPP RE), Place 1 Dose rectally daily as needed (constipation)., Disp: , Rfl:  .  polyethylene glycol (MIRALAX / GLYCOLAX) packet, Take 17 g by mouth daily as needed for moderate constipation., Disp: , Rfl:   Physical exam:  Vitals:   08/30/18 1421  BP: 108/70  Pulse: 87  Resp: 18  Temp: 98.5 F (36.9 C)  TempSrc: Tympanic  SpO2: 97%  Weight: 245 lb (111.1 kg)  Height: '5\' 4"'  (1.626 m)   Physical Exam  Constitutional: She is oriented to person, place, and time. She appears  well-developed and well-nourished.  HENT:  Head: Normocephalic and atraumatic.  Eyes: Pupils are equal, round, and reactive to light. EOM are normal.  Neck: Normal range of motion.  Cardiovascular: Normal rate, regular rhythm and normal heart sounds.  Pulmonary/Chest: Effort normal and breath sounds normal.  Abdominal: Soft. Bowel sounds are normal.  Neurological: She is alert and oriented to person, place, and time.  Skin: Skin is warm and dry.     CMP Latest Ref Rng & Units 02/28/2018  Glucose 65 - 99 mg/dL 114(H)  BUN 6 - 20 mg/dL 21(H)  Creatinine 0.44 - 1.00 mg/dL 1.42(H)  Sodium 135 - 145 mmol/L 139  Potassium 3.5 - 5.1 mmol/L 3.6  Chloride 101 - 111 mmol/L 107  CO2 22 - 32 mmol/L 24  Calcium 8.9 - 10.3 mg/dL 8.8(L)  Total Protein 6.5 - 8.1 g/dL 6.7  Total Bilirubin 0.3 - 1.2 mg/dL 0.7  Alkaline Phos 38 - 126 U/L 72  AST 15 - 41 U/L 20  ALT 14 - 54 U/L 17   CBC Latest Ref Rng & Units 08/30/2018  WBC 3.6 - 11.0 K/uL 7.6  Hemoglobin 12.0 - 16.0 g/dL 11.2(L)  Hematocrit 35.0 - 47.0 % 33.4(L)  Platelets 150 - 440 K/uL 183      Assessment and plan- Patient is a [age].o. female with iron deficiency anemia and hemoglobin C trait  Patient's hemoglobin is stable around 11 which is her baseline.  Her iron studies are normal  and therefore she does not require any IV iron at this time.  She does have chronic microcytosis likely secondary to hemoglobin C trait.  Repeat CBC ferritin and iron studies in 4 and 8 months and I will see her back in 8 months   Visit Diagnosis 1. Iron deficiency anemia, unspecified iron deficiency anemia type      Dr. Randa Evens, MD, MPH [facility] [phone] 09/02/2018 11:34 AM

## 2018-09-02 NOTE — Telephone Encounter (Signed)
Pt was returning [name] [name] phone call. I confirmed with the patient that she wanted to be release in [facility] to [facility] Cardiology.

## 2018-09-03 ENCOUNTER — Encounter: Payer: Medicare HMO | Admitting: *Deleted

## 2018-10-01 IMAGING — CT CT ABD-PELV W/O CM
1 of 2 series · 15 of 32 positions shown, 19 images · non-contrast
Comparison: 12/03/2009

CLINICAL DATA: Right lower back pain, right lower quadrant pain for
1 month.

EXAM:
CT ABDOMEN AND PELVIS WITHOUT CONTRAST
TECHNIQUE: Multidetector CT imaging of the abdomen and pelvis was performed
following the standard protocol without IV contrast.

[Series 2: axial st · axial · 0.83mm/px · z∈[-952,-542]mm · 15 of 90 slices shown, 19 images]
[im 4/90  soft-tissue]
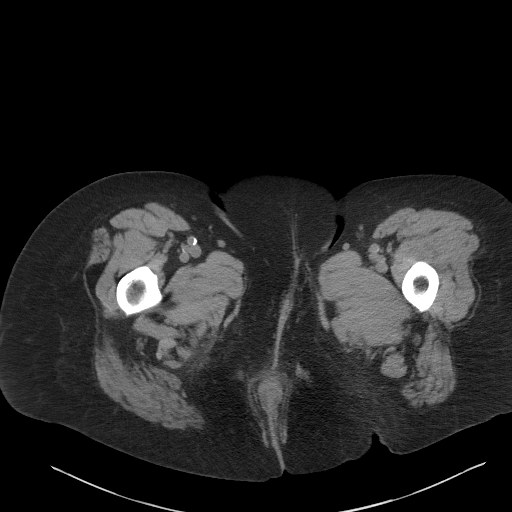
[im 4/90  bone]
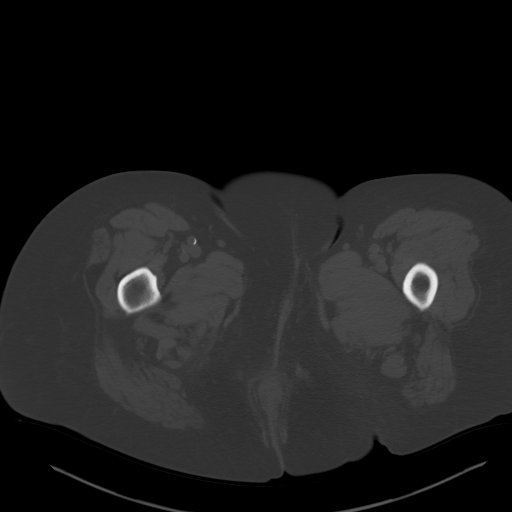
[im 12/90  soft-tissue]
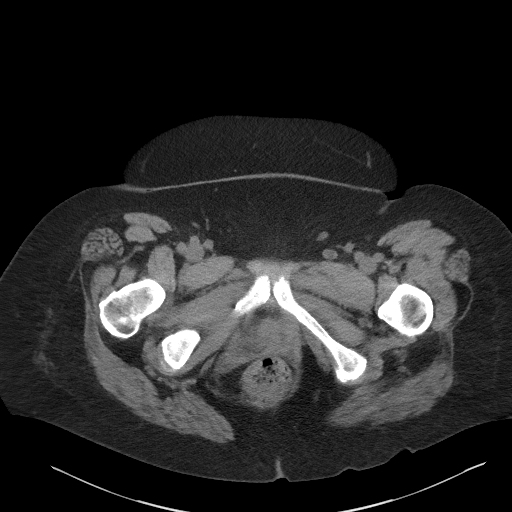
[im 19/90  soft-tissue]
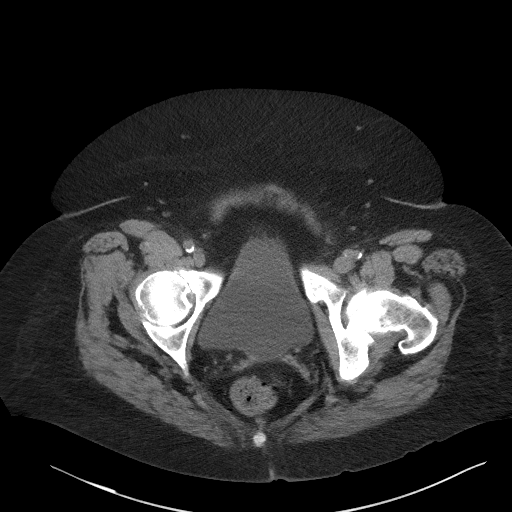
[im 26/90  soft-tissue]
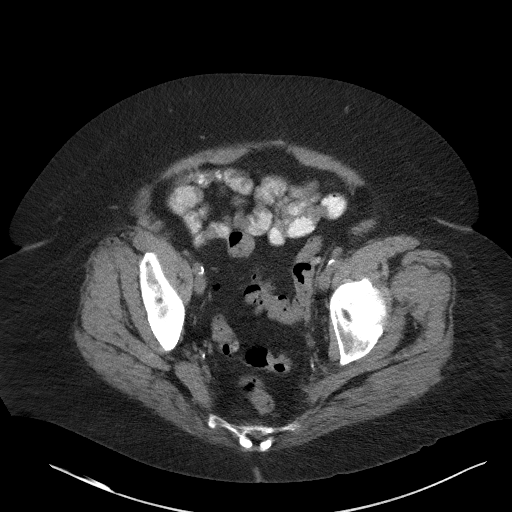
[im 30/90  soft-tissue]
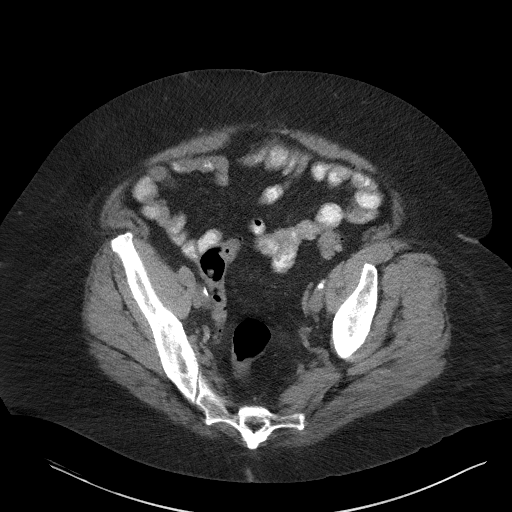
[im 38/90  soft-tissue]
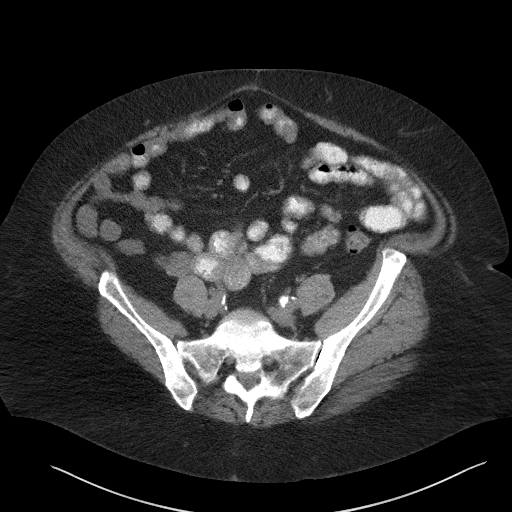
[im 45/90  soft-tissue]
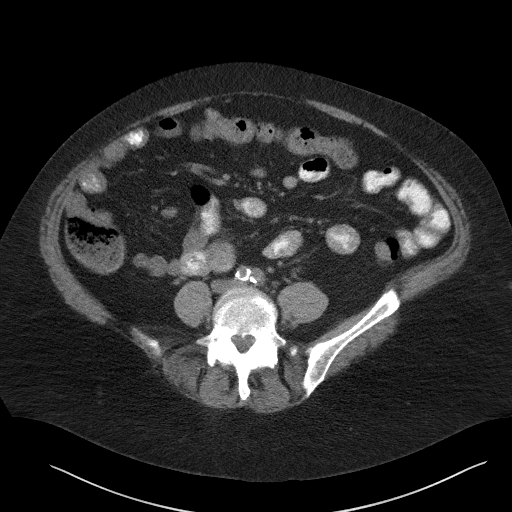
[im 52/90  soft-tissue]
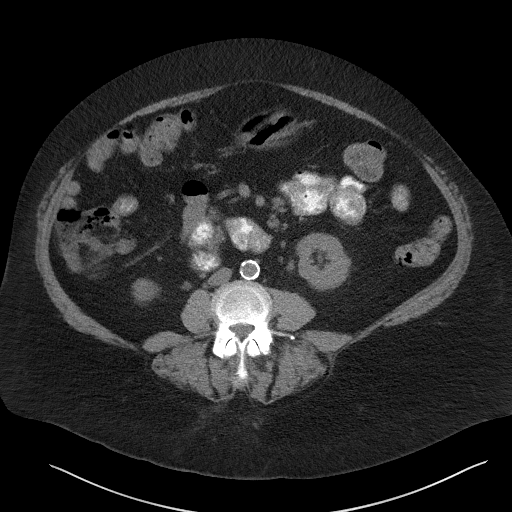
[im 60/90  soft-tissue]
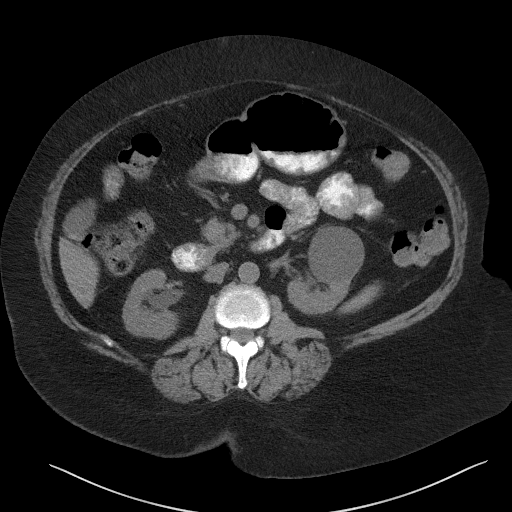
[im 60/90  bone]
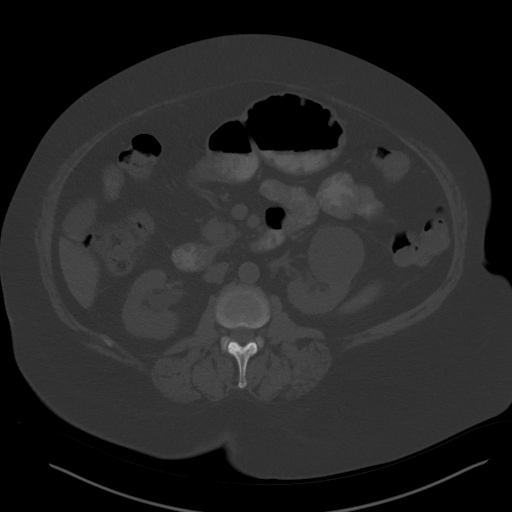
[im 64/90  soft-tissue]
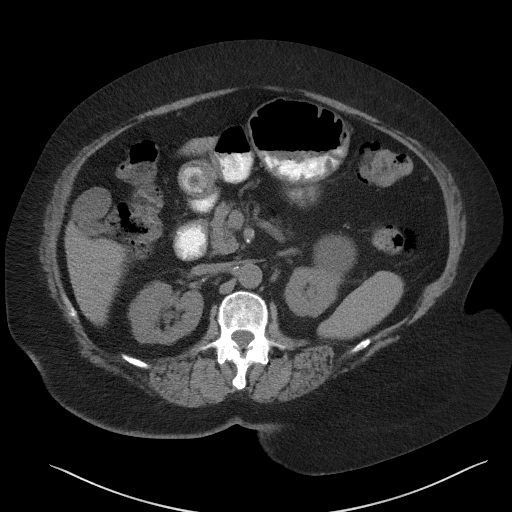
[im 71/90  soft-tissue]
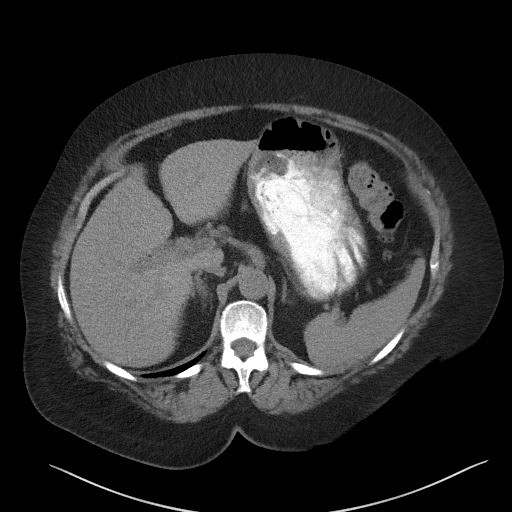
[im 75/90  lung]
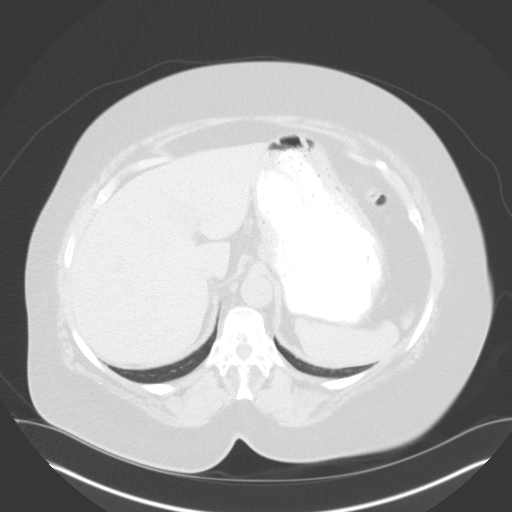
[im 78/90  soft-tissue]
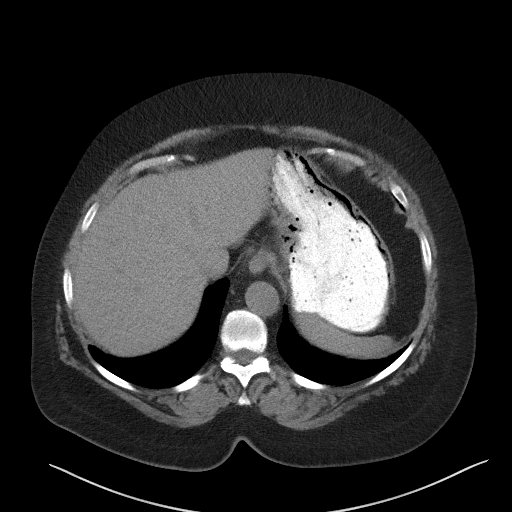
[im 78/90  lung]
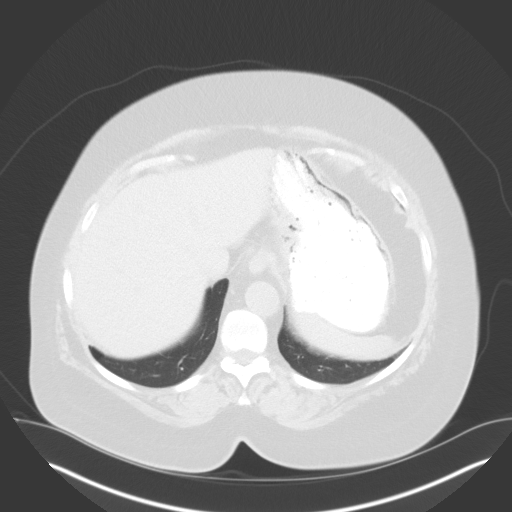
[im 82/90  lung]
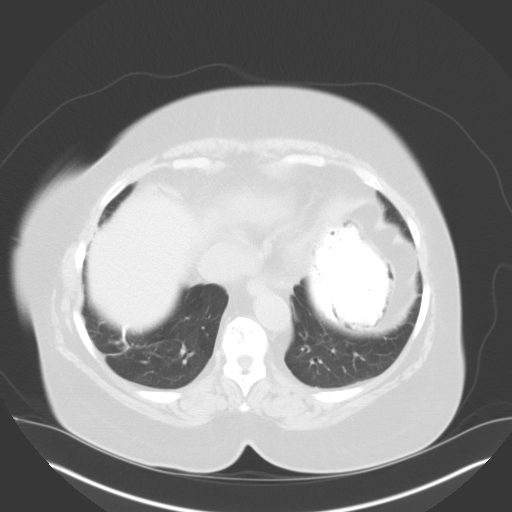
[im 86/90  soft-tissue]
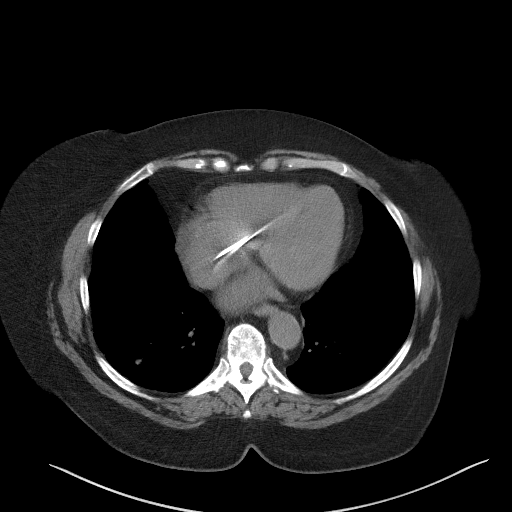
[im 86/90  lung]
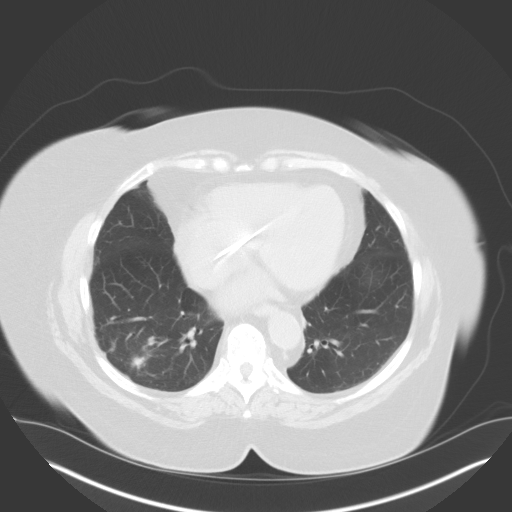

[15 of 32 positions shown; findings below may reference images not displayed]

FINDINGS: Lower chest: Calcified nodular area noted at the right lung base,
likely scarring. This is similar to prior study. No effusions. Heart
is borderline in size. Pacer wire noted in the right heart.

Hepatobiliary: No focal hepatic abnormality. Gallbladder
unremarkable.

Pancreas: No focal abnormality or ductal dilatation.

Spleen: No focal abnormality.  Normal size.

Adrenals/Urinary Tract: 4.5 cm low-density lesion in the midpole of
the left kidney, unchanged. This cannot be fully characterized
without intravenous contrast, but the stability over 7 years
suggests this is a benign cyst. No hydronephrosis. Ureters are
decompressed. Urinary bladder and adrenal glands unremarkable. No
stones.

Stomach/Bowel: Sigmoid and descending colonic diverticulosis. No
active diverticulitis. Stomach and small bowel decompressed,
unremarkable.

Vascular/Lymphatic: Aortic and iliac calcifications. No aneurysm or
adenopathy.

Reproductive: Prior hysterectomy.  No adnexal masses.

Other: No free fluid or free air.

Musculoskeletal: No acute bony abnormality or focal bone lesion.
Degenerative facet disease in the lower lumbar spine.
IMPRESSION: Sigmoid and descending colonic diverticulosis. No active
diverticulitis.

No acute findings in the abdomen or pelvis.

Aortoiliac atherosclerosis.

## 2018-11-20 ENCOUNTER — Other Ambulatory Visit: Payer: Self-pay | Admitting: Cardiology

## 2018-11-20 ENCOUNTER — Other Ambulatory Visit: Payer: Self-pay | Admitting: Rehabilitative and Restorative Service Providers"

## 2018-11-20 DIAGNOSIS — R0602 Shortness of breath: Secondary | ICD-10-CM

## 2018-11-23 ENCOUNTER — Encounter: Payer: Self-pay | Admitting: Emergency Medicine

## 2018-11-23 ENCOUNTER — Emergency Department: Payer: Medicare HMO

## 2018-11-23 ENCOUNTER — Other Ambulatory Visit: Payer: Self-pay

## 2018-11-23 ENCOUNTER — Emergency Department
Admission: EM | Admit: 2018-11-23 | Discharge: 2018-11-23 | Disposition: A | Discharge status: Home / Self Care | Payer: Medicare HMO | Attending: Emergency Medicine | Admitting: Emergency Medicine

## 2018-11-23 DIAGNOSIS — E1122 Type 2 diabetes mellitus with diabetic chronic kidney disease: Secondary | ICD-10-CM | POA: Diagnosis not present

## 2018-11-23 DIAGNOSIS — Z87891 Personal history of nicotine dependence: Secondary | ICD-10-CM | POA: Insufficient documentation

## 2018-11-23 DIAGNOSIS — I130 Hypertensive heart and chronic kidney disease with heart failure and stage 1 through stage 4 chronic kidney disease, or unspecified chronic kidney disease: Secondary | ICD-10-CM | POA: Diagnosis not present

## 2018-11-23 DIAGNOSIS — N183 Chronic kidney disease, stage 3 (moderate): Secondary | ICD-10-CM | POA: Insufficient documentation

## 2018-11-23 DIAGNOSIS — Z7984 Long term (current) use of oral hypoglycemic drugs: Secondary | ICD-10-CM | POA: Insufficient documentation

## 2018-11-23 DIAGNOSIS — F419 Anxiety disorder, unspecified: Secondary | ICD-10-CM | POA: Diagnosis not present

## 2018-11-23 DIAGNOSIS — R0602 Shortness of breath: Secondary | ICD-10-CM | POA: Diagnosis present

## 2018-11-23 DIAGNOSIS — I509 Heart failure, unspecified: Secondary | ICD-10-CM | POA: Diagnosis not present

## 2018-11-23 DIAGNOSIS — Z7901 Long term (current) use of anticoagulants: Secondary | ICD-10-CM | POA: Diagnosis not present

## 2018-11-23 DIAGNOSIS — Z79899 Other long term (current) drug therapy: Secondary | ICD-10-CM | POA: Diagnosis not present

## 2018-11-23 DIAGNOSIS — Z950 Presence of cardiac pacemaker: Secondary | ICD-10-CM | POA: Diagnosis not present

## 2018-11-23 LAB — CBC
HCT: 29.2 % — ABNORMAL LOW (ref 36.0–46.0)
Hemoglobin: 9.5 g/dL — ABNORMAL LOW (ref 12.0–15.0)
MCH: 24.9 pg — ABNORMAL LOW (ref 26.0–34.0)
MCHC: 32.5 g/dL (ref 30.0–36.0)
MCV: 76.6 fL — ABNORMAL LOW (ref 80.0–100.0)
Platelets: 161 10*3/uL (ref 150–400)
RBC: 3.81 MIL/uL — ABNORMAL LOW (ref 3.87–5.11)
RDW: 17.1 % — ABNORMAL HIGH (ref 11.5–15.5)
WBC: 7.1 10*3/uL (ref 4.0–10.5)
nRBC: 0 % (ref 0.0–0.2)

## 2018-11-23 LAB — COMPREHENSIVE METABOLIC PANEL
ALT: 50 U/L — ABNORMAL HIGH (ref 0–44)
AST: 25 U/L (ref 15–41)
Albumin: 3.5 g/dL (ref 3.5–5.0)
Alkaline Phosphatase: 59 U/L (ref 38–126)
Anion gap: 7 (ref 5–15)
BUN: 14 mg/dL (ref 8–23)
CO2: 24 mmol/L (ref 22–32)
Calcium: 8.8 mg/dL — ABNORMAL LOW (ref 8.9–10.3)
Chloride: 110 mmol/L (ref 98–111)
Creatinine, Ser: 1.6 mg/dL — ABNORMAL HIGH (ref 0.44–1.00)
GFR calc Af Amer: 35 mL/min — ABNORMAL LOW (ref >60)
GFR calc non Af Amer: 30 mL/min — ABNORMAL LOW (ref >60)
Glucose, Bld: 192 mg/dL — ABNORMAL HIGH (ref 70–99)
Potassium: 3.6 mmol/L (ref 3.5–5.1)
Sodium: 141 mmol/L (ref 135–145)
Total Bilirubin: 1.1 mg/dL (ref 0.3–1.2)
Total Protein: 6.4 g/dL — ABNORMAL LOW (ref 6.5–8.1)

## 2018-11-23 LAB — PROTIME-INR
INR: 1.85
Prothrombin Time: 21.1 seconds — ABNORMAL HIGH (ref 11.4–15.2)

## 2018-11-23 LAB — TROPONIN I: Troponin I: <0.03 ng/mL (ref <0.03)

## 2018-11-23 LAB — BRAIN NATRIURETIC PEPTIDE: B Natriuretic Peptide: 628 pg/mL — ABNORMAL HIGH (ref 0.0–100.0)

## 2018-11-23 MED ORDER — IPRATROPIUM-ALBUTEROL 0.5-2.5 (3) MG/3ML IN SOLN
3.0000 mL | Freq: Once | RESPIRATORY_TRACT | Status: AC
  Administered 2018-11-23: 3 mL via RESPIRATORY_TRACT
  Filled 2018-11-23: qty 3

## 2018-11-23 MED ORDER — FUROSEMIDE 10 MG/ML IJ SOLN
60.0000 mg | Freq: Once | INTRAMUSCULAR | Status: AC
  Administered 2018-11-23: 60 mg via INTRAVENOUS
  Filled 2018-11-23: qty 8

## 2018-11-23 NOTE — ED Triage Notes (Signed)
SOB x 2 days.  

## 2018-11-23 NOTE — Discharge Instructions (Addendum)
As we discussed please increase your Lasix from 60 mg in the morning to 80 mg in the morning, and from 20 mg in the afternoon to 40 mg in the afternoon for the next 5 days.  Please call your cardiologist, Dr. Ubaldo Glassing Monday morning to arrange a follow-up appointment.  Return to the emergency department if you experience any worsening trouble breathing or if you develop any chest pain, or for any other symptom personally concerning to yourself.

## 2018-11-23 NOTE — ED Provider Notes (Signed)
[facility] Emergency Department Provider Note  Time seen: 8:03 AM  I have reviewed the triage vital signs and the nursing notes.   HISTORY  Chief Complaint Shortness of Breath    HPI [name] [name] is a 83 y.o. female with a past medical history of anemia, heart failure, gastric reflux, hyperlipidemia, presents to the emergency department for shortness of breath.  According to the patient over the past 3 to 4 days she has had worsening shortness of breath with cough and white sputum production.  States she is not able to lie back and had to sit up in a recliner last night but had difficulty sleeping because of the shortness of breath.  Denies any chest pain at any point recently.  Largely negative review of systems.   Past Medical History:  Diagnosis Date  . 1st degree AV block    a. PR 400 msec with Apacing  . Anemia   . Anxiety   . Arthritis    a. shoulders.  . Chest pain    a. 2004 Cath:  reportedly nl;  b. 09/2013 Neg MV.  . Chronic diastolic heart failure ([place])    a. 09/2013 Echo: EF 50-55%, mild LVH, mild to mod MR/TR.  Marland Kitchen Chronic kidney disease   . Congestive heart disease ([place])   . Esophageal reflux   . H/O hiatal hernia   . History of UTI   . Hyperlipidemia   . Morbid obesity ([place])   . OSA on CPAP   . PAF (paroxysmal atrial fibrillation) ([place])   . SSS (sick sinus syndrome) ([place])    a. 2008 s/p MDT PPM;  b. 11/2013 Gen change- MDT ADDRL1 Adapta DC PPM, ser # [id number].  . Symptomatic PVCs   . Syncope and collapse    a. Felt to be vasovagal.  . Thyroid disease    on no meds per pt  . Type II diabetes mellitus ([place])   . Unspecified essential hypertension   . Unspecified glaucoma(365.9)     Patient Active Problem List   Diagnosis Date Noted  . Eczema 04/03/2017  . Glaucoma (increased eye pressure) 04/03/2017  . Hiatal hernia 04/03/2017  . Iron deficiency anemia 04/03/2017  . Osteoarthritis 04/03/2017  . Pure hypercholesterolemia  04/03/2017  . Tinnitus 04/03/2017  . Valvular heart disease 04/03/2017  . Venous stasis 04/03/2017  . Bilateral hearing loss 03/07/2017  . Tinnitus, bilateral 03/07/2017  . Pounding noise in right ear 02/13/2017  . Eczematous skin lesions 02/02/2017  . URI with cough and congestion 12/13/2016  . Colicky RLQ abdominal pain 11/01/2016  . 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 Anxiety 07/28/2016  . Acute maxillary sinusitis 03/23/2016  . Pain of right hip joint 03/23/2016  . CKD (chronic kidney disease) stage 3, GFR 30-59 ml/min (HCC) 02/03/2016  . Chronic gout of right foot 12/16/2015  . Productive cough 10/26/2015  . Infection of skin of left ear lobe 10/11/2015  . Acute non-recurrent maxillary sinusitis 09/17/2015  . Allergic rhinitis 09/14/2015  . Hyperlipidemia 09/14/2015  . Gout attack 07/21/2015  . Anticoagulation monitoring, INR range 2-3 05/18/2015  . RLS (restless legs syndrome) 04/14/2015  . History of colon polyps 12/23/2014  . Crohn's disease in remission ([place]) 09/15/2014  . 1st degree AV block   . Chronic diastolic heart failure ([place])   . Cardiac arrhythmia 12/01/2013  . Hemoptysis 11/24/2013  . Acute on chronic diastolic CHF (congestive heart failure) ([place]) 10/17/2013  . Episodic lightheadedness 07/16/2013  . Palpitations  07/16/2013  . Syncope 08/15/2012  . Back pain 05/30/2012  . OSA (obstructive sleep apnea) 01/08/2012  . DYSPNEA 01/28/2010  . PVC (premature ventricular contraction) 09/13/2009  . Flutter-fibrillation ([place]) 09/13/2009  . EDEMA 08/23/2009  . Diabetes mellitus type 2, controlled, without complications ([place]) 12/87/8676  . GLAUCOMA 04/13/2009  . Essential hypertension 04/13/2009  . PAF (paroxysmal atrial fibrillation) ([place]) 04/13/2009  . SICK SINUS/ TACHY-BRADY SYNDROME 04/13/2009  . GERD 04/13/2009  . Cardiac pacemaker - Medtronic 04/13/2009    Past Surgical History:  Procedure Laterality Date  . APPENDECTOMY    . CARDIOVERSION N/A  04/26/2017   Procedure: Cardioversion;  Surgeon: Minna Merritts, MD;  Location: [facility] ORS;  Service: Cardiovascular;  Laterality: N/A;  . DILATION AND CURETTAGE OF UTERUS    . INCISION AND DRAINAGE OF WOUND Right 03/1998   "leg; it was like a boil" (12/01/2013)  . INSERT / REPLACE / REMOVE PACEMAKER  2008; 12/01/2013   MDT ADDRL1 pacemaker - gen change by Dr Caryl Comes 12/01/2013  . LUNG BIOPSY  2010   [facility]  . PERMANENT PACEMAKER GENERATOR CHANGE N/A 12/01/2013   Procedure: PERMANENT PACEMAKER GENERATOR CHANGE;  Surgeon: Deboraha Sprang, MD;  Location: [facility] CATH LAB;  Service: Cardiovascular;  Laterality: N/A;  . [place]    Prior to Admission medications   Medication Sig Start Date End Date Taking? Authorizing Provider  ACCU-CHEK SOFTCLIX LANCETS lancets as directed. Check CBG's fasting once daily. Dx: E11.22, N18.3 05/14/17   [provider]  acetaminophen (TYLENOL) 500 MG tablet Take 1,000 mg by mouth every 6 (six) hours as needed for moderate pain.     [provider]  amLODipine (NORVASC) 10 MG tablet TAKE 1 TABLET (10 MG TOTAL) BY MOUTH DAILY. 12/20/16   Roselee Nova, MD  atorvastatin (LIPITOR) 20 MG tablet TAKE 1 TABLET (20 MG TOTAL) BY MOUTH DAILY. Patient taking differently: Take 20 mg by mouth at bedtime.  02/02/17   Roselee Nova, MD  Brinzolamide-Brimonidine [facility]) 1-0.2 % SUSP Place 1 drop into both eyes 2 (two) times daily. Patient not taking: Reported on 08/30/2018 07/05/15   Roselee Nova, MD  carvedilol (COREG) 12.5 MG tablet TAKE 1 TABLET BY MOUTH IN THE MORNING WITH BREAKFAST AND 2 TABLETS IN THE EVENING BEFORE BED 07/01/18   Minna Merritts, MD  colchicine 0.6 MG tablet as needed. Take 1.2mg s at first sign of gout flare, wait one hour then take 0.6mg  as needed for gout    [provider]  esomeprazole (NEXIUM) 40 MG capsule TAKE 1 CAPSULE (40 MG TOTAL) BY MOUTH DAILY. 11/16/16   Roselee Nova, MD  ferrous sulfate 325  (65 FE) MG EC tablet Take 325 mg by mouth daily. 03/01/17   [provider]  flecainide (TAMBOCOR) 100 MG tablet Take 1 tablet (100 mg total) by mouth 2 (two) times daily. 03/08/18   Minna Merritts, MD  flecainide (TAMBOCOR) 100 MG tablet TAKE 1 TABLET BY MOUTH TWICE A DAY 08/06/18   Minna Merritts, MD  furosemide (LASIX) 20 MG tablet Take 1 tablet (20 mg total) by mouth daily. Patient taking differently: Take 40 mg by mouth daily. May take an additional 20mg s 6 hours after first dose as needed for swelling 02/02/17   Rochel Brome A, MD  glipiZIDE (GLUCOTROL) 10 MG tablet TAKE 1 TABLET (10 MG TOTAL) BY MOUTH 2 (TWO) TIMES DAILY BEFORE A MEAL. 01/29/17   Rochel Brome  A, MD  Glycerin, Laxative, (FLEET LIQUID GLYCERIN SUPP RE) Place 1 Dose rectally daily as needed (constipation).    [provider]  KLOR-CON M10 10 MEQ tablet Take 1 tablet by mouth daily. 06/12/18   [provider]  metFORMIN (GLUCOPHAGE-XR) 500 MG 24 hr tablet TAKE 1 TABLET (500 MG TOTAL) BY MOUTH 2 (TWO) TIMES DAILY. 02/02/17   Roselee Nova, MD  mometasone (ELOCON) 0.1 % cream Apply topically daily. Apply to affected area as directed two times a day 02/02/17   Roselee Nova, MD  Multiple Vitamins-Minerals (CENTRUM SILVER PO) Take 1 tablet by mouth daily.     [provider]  olmesartan (BENICAR) 40 MG tablet TAKE 1 TABLET (40 MG TOTAL) BY MOUTH DAILY. 12/19/16   Roselee Nova, MD  polyethylene glycol [facility] Floria Raveling) packet Take 17 g by mouth daily as needed for moderate constipation.    [provider]  TRADJENTA 5 MG TABS tablet TAKE 1 TABLET (5 MG TOTAL) BY MOUTH DAILY. Patient taking differently: TAKE 1 TABLET (5 MG TOTAL) BY MOUTH DAILY WITH BREAKFAST 12/07/16   Rochel Brome A, MD  travoprost, benzalkonium, (TRAVATAN) 0.004 % ophthalmic solution Place 1 drop into both eyes at bedtime.     [provider]  warfarin (COUMADIN) 3 MG tablet TAKE 1-1.5 TABLETS DAILY  AT 6 PM. TAKE 1 TAB ON TUES, THURS, SAT, SUN. TAKE 4.5 MG ON MON, WED, FRI Patient taking differently: TAKE 3MG  DAILY ON TUES, THURS, SAT, SUN. TAKE 4.5 MG DAILY ON MON, WED, FRI AT [facility] 10/25/15   Roselee Nova, MD    Allergies  Allergen Reactions  . Metoprolol Other (See Comments)    Bad dreams Patient stated that she had suicidal thoughts while taking this medication. Other reaction(s): Other (See Comments) Other reaction(s): Other (See Comments) Bad dreams Patient stated that she had suicidal thoughts while taking this medication. Bad dreams Patient stated that she had suicidal thoughts while taking this medication. Bad dreams Patient stated that she had suicidal thoughts while taking this medication. Other reaction(s): Other (See Comments) Bad dreams   . Diltiazem Other (See Comments)    Patient stated that she had bad dreams with this medication Other reaction(s): Other (See Comments) Patient stated that she had bad dreams with this medication Other reaction(s): Other (See Comments) Patient stated that she had bad dreams with this medication Patient stated that she had bad dreams with this medication Patient stated that she had bad dreams with this medication  . Levofloxacin Nausea And Vomiting and Nausea Only    Other reaction(s): Nausea And Vomiting    Family History  Problem Relation Age of Onset  . Heart disease Father   . Rheum arthritis Father   . Allergies Father   . Liver cancer Mother   . Pancreatic cancer Mother   . Hypertension Sister   . Rheum arthritis Sister   . Breast cancer Maternal Aunt     Social History Social History   Tobacco Use  . Smoking status: Former Smoker    Packs/day: 0.50    Years: 34.00    Pack years: 17.00    Types: Cigarettes    Last attempt to quit: 08/15/1989    Years since quitting: 29.2  . Smokeless tobacco: Never Used  Substance Use Topics  . Alcohol use: No    Alcohol/week: 0.0 standard drinks  . Drug use: No     Review of Systems Constitutional: Negative  for fever Cardiovascular: Negative for chest pain. Respiratory: Positive for shortness of breath.  Positive for cough with white sputum production. Gastrointestinal: Negative for abdominal pain, vomiting  Genitourinary: Negative for urinary compaints Musculoskeletal: Increased lower extremity edema x3 days. Skin: Negative for skin complaints  Neurological: Negative for headache All other ROS negative  ____________________________________________   PHYSICAL EXAM:  VITAL SIGNS: ED Triage Vitals  Enc Vitals Group     BP 11/23/18 0731 (!) 192/94     Pulse Rate 11/23/18 0731 80     Resp 11/23/18 0731 18     Temp 11/23/18 0731 98.7 F (37.1 C)     Temp Source 11/23/18 0731 Oral     SpO2 11/23/18 0731 97 %     Weight 11/23/18 0735 241 lb (109.3 kg)     Height 11/23/18 0735 5\' 4"  (1.626 m)     Head Circumference --      Peak Flow --      Pain Score 11/23/18 0734 0     Pain Loc --      Pain Edu? --      Excl. in [place]? --    Constitutional: Alert and oriented. Well appearing and in no distress. Eyes: Normal exam ENT   Head: Normocephalic and atraumatic.   Mouth/Throat: Mucous membranes are moist. Cardiovascular: Normal rate, regular rhythm. No murmur Respiratory: Normal respiratory effort without tachypnea nor retractions.  Slight rales/wheeze on auscultation bilaterally. Gastrointestinal: Soft and nontender. No distention.  Musculoskeletal: Nontender with normal range of motion in all extremities.  2+ lower extremity edema equal bilaterally Neurologic:  Normal speech and language. No gross focal neurologic deficits Skin:  Skin is warm, dry and intact.  Psychiatric: Mood and affect are normal.   ____________________________________________    EKG  EKG viewed and interpreted by myself appears to show atrial flutter with a variable AV block at 77 bpm, narrow QRS.  Normal axis.  Nonspecific ST  changes.  ____________________________________________    RADIOLOGY  Cardiomegaly without overt edema  ____________________________________________   INITIAL IMPRESSION / ASSESSMENT AND PLAN / ED COURSE  Pertinent labs & imaging results that were available during my care of the patient were reviewed by me and considered in my medical decision making (see chart for details).  Patient presents emergency department for 3 to 4 days of worsening shortness of breath with cough and white sputum production.  Now she states she is unable to lie down asked to sit up or stand up for symptom relief.  Differential would include CHF exacerbation, pneumonia, reactive airway disease, pneumothorax, ACS.  We will check labs including cardiac enzymes obtain a chest x-ray and EKG.  She does have slight wheeze and possibly rales on exam we will dose a DuoNeb while awaiting chest x-ray and lab results.  Has the patient is unable to lie flat, worsening symptoms when lying back along with cough and white sputum production highly suspect CHF exacerbation.    I reviewed patient's recent cardiology note from Dr. Ubaldo Glassing.  Patient has a history of A. fib/a flutter currently anticoagulated with warfarin.  Patient's labs have resulted, largely nonrevealing, negative troponin.  Patient's chest x-ray shows mild changes consistent with CHF without overt edema.  Patient currently satting 98% on room air, no distress.  We will dose 60 mg of IV Lasix in the emergency department.  Patient currently takes 60 mg of oral Lasix in the morning and 20 mg in the afternoon.  We will increase to 80 mg in the  morning and 40 mg in the afternoon for the next 5 days.  Patient will call her cardiologist Dr. Ubaldo Glassing Monday morning.  I discussed with the patient return precautions for any worsening trouble breathing or development of any chest pain.  Patient agreeable to plan of care.    ____________________________________________   FINAL  CLINICAL IMPRESSION(S) / ED DIAGNOSES  CHF exacerbation    Harvest Dark, MD 11/23/18 [phone]

## 2018-11-26 ENCOUNTER — Ambulatory Visit: Payer: Medicare HMO

## 2018-12-01 ENCOUNTER — Other Ambulatory Visit: Payer: Self-pay

## 2018-12-01 ENCOUNTER — Encounter: Payer: Self-pay | Admitting: Emergency Medicine

## 2018-12-01 ENCOUNTER — Inpatient Hospital Stay
Admission: EM | Admit: 2018-12-01 | Discharge: 2018-12-03 | DRG: 193 | Disposition: A | Discharge status: Home / Self Care | Payer: Medicare HMO | Attending: Specialist | Admitting: Specialist

## 2018-12-01 ENCOUNTER — Emergency Department: Payer: Medicare HMO

## 2018-12-01 DIAGNOSIS — M19011 Primary osteoarthritis, right shoulder: Secondary | ICD-10-CM | POA: Diagnosis present

## 2018-12-01 DIAGNOSIS — E785 Hyperlipidemia, unspecified: Secondary | ICD-10-CM | POA: Diagnosis present

## 2018-12-01 DIAGNOSIS — I440 Atrioventricular block, first degree: Secondary | ICD-10-CM | POA: Diagnosis present

## 2018-12-01 DIAGNOSIS — N183 Chronic kidney disease, stage 3 unspecified: Secondary | ICD-10-CM

## 2018-12-01 DIAGNOSIS — Z8639 Personal history of other endocrine, nutritional and metabolic disease: Secondary | ICD-10-CM

## 2018-12-01 DIAGNOSIS — N179 Acute kidney failure, unspecified: Secondary | ICD-10-CM | POA: Diagnosis present

## 2018-12-01 DIAGNOSIS — Z888 Allergy status to other drugs, medicaments and biological substances status: Secondary | ICD-10-CM

## 2018-12-01 DIAGNOSIS — E1122 Type 2 diabetes mellitus with diabetic chronic kidney disease: Secondary | ICD-10-CM | POA: Diagnosis present

## 2018-12-01 DIAGNOSIS — Z9989 Dependence on other enabling machines and devices: Secondary | ICD-10-CM

## 2018-12-01 DIAGNOSIS — D631 Anemia in chronic kidney disease: Secondary | ICD-10-CM | POA: Diagnosis present

## 2018-12-01 DIAGNOSIS — G4733 Obstructive sleep apnea (adult) (pediatric): Secondary | ICD-10-CM | POA: Diagnosis present

## 2018-12-01 DIAGNOSIS — I480 Paroxysmal atrial fibrillation: Secondary | ICD-10-CM | POA: Diagnosis present

## 2018-12-01 DIAGNOSIS — J9801 Acute bronchospasm: Secondary | ICD-10-CM | POA: Diagnosis present

## 2018-12-01 DIAGNOSIS — Z87891 Personal history of nicotine dependence: Secondary | ICD-10-CM

## 2018-12-01 DIAGNOSIS — J9601 Acute respiratory failure with hypoxia: Secondary | ICD-10-CM | POA: Diagnosis present

## 2018-12-01 DIAGNOSIS — H409 Unspecified glaucoma: Secondary | ICD-10-CM | POA: Diagnosis present

## 2018-12-01 DIAGNOSIS — Z6841 Body Mass Index (BMI) 40.0 and over, adult: Secondary | ICD-10-CM

## 2018-12-01 DIAGNOSIS — K219 Gastro-esophageal reflux disease without esophagitis: Secondary | ICD-10-CM | POA: Diagnosis present

## 2018-12-01 DIAGNOSIS — T380X5A Adverse effect of glucocorticoids and synthetic analogues, initial encounter: Secondary | ICD-10-CM | POA: Diagnosis not present

## 2018-12-01 DIAGNOSIS — Z79899 Other long term (current) drug therapy: Secondary | ICD-10-CM

## 2018-12-01 DIAGNOSIS — Z881 Allergy status to other antibiotic agents status: Secondary | ICD-10-CM

## 2018-12-01 DIAGNOSIS — J1000 Influenza due to other identified influenza virus with unspecified type of pneumonia: Secondary | ICD-10-CM | POA: Diagnosis present

## 2018-12-01 DIAGNOSIS — E119 Type 2 diabetes mellitus without complications: Secondary | ICD-10-CM

## 2018-12-01 DIAGNOSIS — I5032 Chronic diastolic (congestive) heart failure: Secondary | ICD-10-CM | POA: Diagnosis present

## 2018-12-01 DIAGNOSIS — M19012 Primary osteoarthritis, left shoulder: Secondary | ICD-10-CM | POA: Diagnosis present

## 2018-12-01 DIAGNOSIS — E1165 Type 2 diabetes mellitus with hyperglycemia: Secondary | ICD-10-CM | POA: Diagnosis not present

## 2018-12-01 DIAGNOSIS — Z950 Presence of cardiac pacemaker: Secondary | ICD-10-CM | POA: Diagnosis not present

## 2018-12-01 DIAGNOSIS — Z7984 Long term (current) use of oral hypoglycemic drugs: Secondary | ICD-10-CM

## 2018-12-01 DIAGNOSIS — R791 Abnormal coagulation profile: Secondary | ICD-10-CM | POA: Diagnosis present

## 2018-12-01 DIAGNOSIS — R0602 Shortness of breath: Secondary | ICD-10-CM | POA: Diagnosis present

## 2018-12-01 DIAGNOSIS — J189 Pneumonia, unspecified organism: Secondary | ICD-10-CM | POA: Diagnosis present

## 2018-12-01 DIAGNOSIS — J101 Influenza due to other identified influenza virus with other respiratory manifestations: Secondary | ICD-10-CM

## 2018-12-01 DIAGNOSIS — Z7901 Long term (current) use of anticoagulants: Secondary | ICD-10-CM

## 2018-12-01 DIAGNOSIS — I130 Hypertensive heart and chronic kidney disease with heart failure and stage 1 through stage 4 chronic kidney disease, or unspecified chronic kidney disease: Secondary | ICD-10-CM | POA: Diagnosis present

## 2018-12-01 LAB — URINALYSIS, COMPLETE (UACMP) WITH MICROSCOPIC
Bacteria, UA: NONE SEEN
Bilirubin Urine: NEGATIVE
Glucose, UA: NEGATIVE mg/dL
Hgb urine dipstick: NEGATIVE
Ketones, ur: NEGATIVE mg/dL
Leukocytes, UA: NEGATIVE
Nitrite: NEGATIVE
Protein, ur: NEGATIVE mg/dL
Specific Gravity, Urine: 1.006 (ref 1.005–1.030)
pH: 7 (ref 5.0–8.0)

## 2018-12-01 LAB — COMPREHENSIVE METABOLIC PANEL
ALT: 23 U/L (ref 0–44)
AST: 26 U/L (ref 15–41)
Albumin: 3.4 g/dL — ABNORMAL LOW (ref 3.5–5.0)
Alkaline Phosphatase: 48 U/L (ref 38–126)
Anion gap: 9 (ref 5–15)
BUN: 19 mg/dL (ref 8–23)
CO2: 26 mmol/L (ref 22–32)
Calcium: 8.7 mg/dL — ABNORMAL LOW (ref 8.9–10.3)
Chloride: 107 mmol/L (ref 98–111)
Creatinine, Ser: 1.98 mg/dL — ABNORMAL HIGH (ref 0.44–1.00)
GFR calc Af Amer: 27 mL/min — ABNORMAL LOW (ref >60)
GFR calc non Af Amer: 23 mL/min — ABNORMAL LOW (ref >60)
Glucose, Bld: 111 mg/dL — ABNORMAL HIGH (ref 70–99)
Potassium: 3.1 mmol/L — ABNORMAL LOW (ref 3.5–5.1)
Sodium: 142 mmol/L (ref 135–145)
Total Bilirubin: 1.1 mg/dL (ref 0.3–1.2)
Total Protein: 6.3 g/dL — ABNORMAL LOW (ref 6.5–8.1)

## 2018-12-01 LAB — CBC
HCT: 31.6 % — ABNORMAL LOW (ref 36.0–46.0)
Hemoglobin: 10.3 g/dL — ABNORMAL LOW (ref 12.0–15.0)
MCH: 24.8 pg — ABNORMAL LOW (ref 26.0–34.0)
MCHC: 32.6 g/dL (ref 30.0–36.0)
MCV: 76 fL — ABNORMAL LOW (ref 80.0–100.0)
Platelets: 188 10*3/uL (ref 150–400)
RBC: 4.16 MIL/uL (ref 3.87–5.11)
RDW: 17.1 % — ABNORMAL HIGH (ref 11.5–15.5)
WBC: 4.8 10*3/uL (ref 4.0–10.5)
nRBC: 0 % (ref 0.0–0.2)

## 2018-12-01 LAB — INFLUENZA PANEL BY PCR (TYPE A & B)
Influenza A By PCR: POSITIVE — AB
Influenza B By PCR: NEGATIVE

## 2018-12-01 LAB — PROTIME-INR
INR: 5.07
Prothrombin Time: 46.1 seconds — ABNORMAL HIGH (ref 11.4–15.2)

## 2018-12-01 LAB — GLUCOSE, CAPILLARY
Glucose-Capillary: 153 mg/dL — ABNORMAL HIGH (ref 70–99)
Glucose-Capillary: 238 mg/dL — ABNORMAL HIGH (ref 70–99)
Glucose-Capillary: 174 mg/dL — ABNORMAL HIGH (ref 70–99)

## 2018-12-01 LAB — LIPASE, BLOOD: Lipase: 25 U/L (ref 11–51)

## 2018-12-01 LAB — PROCALCITONIN: Procalcitonin: <0.1 ng/mL

## 2018-12-01 LAB — EXPECTORATED SPUTUM ASSESSMENT W GRAM STAIN, RFLX TO RESP C

## 2018-12-01 LAB — EXPECTORATED SPUTUM ASSESSMENT W REFEX TO RESP CULTURE

## 2018-12-01 MED ORDER — OSELTAMIVIR PHOSPHATE 75 MG PO CAPS
75.0000 mg | ORAL_CAPSULE | Freq: Once | ORAL | Status: AC
  Administered 2018-12-01: 75 mg via ORAL
  Filled 2018-12-01: qty 1

## 2018-12-01 MED ORDER — METHYLPREDNISOLONE SODIUM SUCC 125 MG IJ SOLR
60.0000 mg | Freq: Four times a day (QID) | INTRAMUSCULAR | Status: DC
  Administered 2018-12-01 – 2018-12-02 (×3): 60 mg via INTRAVENOUS
  Filled 2018-12-01 (×3): qty 2

## 2018-12-01 MED ORDER — PANTOPRAZOLE SODIUM 40 MG PO TBEC
40.0000 mg | DELAYED_RELEASE_TABLET | Freq: Every day | ORAL | Status: DC
  Administered 2018-12-01 – 2018-12-02 (×2): 40 mg via ORAL
  Filled 2018-12-01 (×2): qty 1

## 2018-12-01 MED ORDER — CARVEDILOL 25 MG PO TABS
50.0000 mg | ORAL_TABLET | Freq: Every day | ORAL | Status: DC
  Administered 2018-12-01 – 2018-12-02 (×2): 50 mg via ORAL
  Filled 2018-12-01 (×2): qty 2

## 2018-12-01 MED ORDER — AMIODARONE HCL 100 MG PO TABS
100.0000 mg | ORAL_TABLET | Freq: Two times a day (BID) | ORAL | Status: DC
  Administered 2018-12-01 – 2018-12-02 (×3): 100 mg via ORAL
  Filled 2018-12-01 (×4): qty 1

## 2018-12-01 MED ORDER — LINAGLIPTIN 5 MG PO TABS
5.0000 mg | ORAL_TABLET | Freq: Every day | ORAL | Status: DC
  Administered 2018-12-01 – 2018-12-03 (×3): 5 mg via ORAL
  Filled 2018-12-01 (×3): qty 1

## 2018-12-01 MED ORDER — ACETAMINOPHEN 500 MG PO TABS
1000.0000 mg | ORAL_TABLET | Freq: Four times a day (QID) | ORAL | Status: DC | PRN
  Administered 2018-12-02: 1000 mg via ORAL
  Filled 2018-12-01: qty 2

## 2018-12-01 MED ORDER — IPRATROPIUM-ALBUTEROL 0.5-2.5 (3) MG/3ML IN SOLN
3.0000 mL | Freq: Once | RESPIRATORY_TRACT | Status: AC
  Administered 2018-12-01: 3 mL via RESPIRATORY_TRACT
  Filled 2018-12-01: qty 3

## 2018-12-01 MED ORDER — BENZONATATE 100 MG PO CAPS
100.0000 mg | ORAL_CAPSULE | Freq: Once | ORAL | Status: AC
  Administered 2018-12-01: 100 mg via ORAL
  Filled 2018-12-01: qty 1

## 2018-12-01 MED ORDER — CARVEDILOL 25 MG PO TABS
25.0000 mg | ORAL_TABLET | Freq: Every day | ORAL | Status: DC
  Administered 2018-12-01 – 2018-12-03 (×3): 25 mg via ORAL
  Filled 2018-12-01 (×3): qty 1

## 2018-12-01 MED ORDER — WARFARIN - PHARMACIST DOSING INPATIENT: Freq: Every day | Status: DC

## 2018-12-01 MED ORDER — SODIUM CHLORIDE 0.9 % IV BOLUS
500.0000 mL | Freq: Once | INTRAVENOUS | Status: AC
  Administered 2018-12-01: 500 mL via INTRAVENOUS

## 2018-12-01 MED ORDER — GLIPIZIDE 10 MG PO TABS
10.0000 mg | ORAL_TABLET | Freq: Two times a day (BID) | ORAL | Status: DC
  Administered 2018-12-01 – 2018-12-03 (×4): 10 mg via ORAL
  Filled 2018-12-01 (×5): qty 1

## 2018-12-01 MED ORDER — GUAIFENESIN ER 600 MG PO TB12
600.0000 mg | ORAL_TABLET | Freq: Two times a day (BID) | ORAL | Status: DC
  Administered 2018-12-01 – 2018-12-03 (×5): 600 mg via ORAL
  Filled 2018-12-01 (×5): qty 1

## 2018-12-01 MED ORDER — FERROUS SULFATE 325 (65 FE) MG PO TABS
325.0000 mg | ORAL_TABLET | Freq: Two times a day (BID) | ORAL | Status: DC
  Administered 2018-12-01 – 2018-12-03 (×4): 325 mg via ORAL
  Filled 2018-12-01 (×4): qty 1

## 2018-12-01 MED ORDER — ONDANSETRON HCL 4 MG/2ML IJ SOLN
4.0000 mg | Freq: Once | INTRAMUSCULAR | Status: AC
  Administered 2018-12-01: 4 mg via INTRAVENOUS
  Filled 2018-12-01: qty 2

## 2018-12-01 MED ORDER — SODIUM CHLORIDE 0.9 % IV SOLN
INTRAVENOUS | Status: DC
  Administered 2018-12-01 – 2018-12-02 (×2): via INTRAVENOUS

## 2018-12-01 MED ORDER — ALBUTEROL SULFATE (2.5 MG/3ML) 0.083% IN NEBU
5.0000 mg | INHALATION_SOLUTION | Freq: Once | RESPIRATORY_TRACT | Status: AC
  Administered 2018-12-01: 5 mg via RESPIRATORY_TRACT
  Filled 2018-12-01: qty 6

## 2018-12-01 MED ORDER — OSELTAMIVIR PHOSPHATE 30 MG PO CAPS
30.0000 mg | ORAL_CAPSULE | Freq: Every day | ORAL | Status: DC
  Administered 2018-12-02: 08:00:00 30 mg via ORAL
  Filled 2018-12-01: qty 1

## 2018-12-01 MED ORDER — DOXYCYCLINE HYCLATE 100 MG PO TABS
100.0000 mg | ORAL_TABLET | Freq: Two times a day (BID) | ORAL | Status: DC
  Administered 2018-12-01 – 2018-12-03 (×4): 100 mg via ORAL
  Filled 2018-12-01 (×6): qty 1

## 2018-12-01 MED ORDER — ATORVASTATIN CALCIUM 20 MG PO TABS
20.0000 mg | ORAL_TABLET | Freq: Every day | ORAL | Status: DC
  Administered 2018-12-02 – 2018-12-03 (×2): 20 mg via ORAL
  Filled 2018-12-01 (×2): qty 1

## 2018-12-01 MED ORDER — TRAVOPROST (BAK FREE) 0.004 % OP SOLN
1.0000 [drp] | Freq: Every day | OPHTHALMIC | Status: DC
  Administered 2018-12-02: 1 [drp] via OPHTHALMIC
  Filled 2018-12-01: qty 2.5

## 2018-12-01 MED ORDER — IRBESARTAN 75 MG PO TABS
37.5000 mg | ORAL_TABLET | Freq: Every day | ORAL | Status: DC
  Administered 2018-12-02 – 2018-12-03 (×2): 37.5 mg via ORAL
  Filled 2018-12-01 (×3): qty 0.5

## 2018-12-01 MED ORDER — CARVEDILOL 25 MG PO TABS: 25.0000 mg | ORAL_TABLET | ORAL | Status: DC

## 2018-12-01 MED ORDER — ATORVASTATIN CALCIUM 20 MG PO TABS
20.0000 mg | ORAL_TABLET | Freq: Every day | ORAL | Status: DC
  Filled 2018-12-01: qty 1

## 2018-12-01 MED ORDER — AMLODIPINE BESYLATE 5 MG PO TABS
10.0000 mg | ORAL_TABLET | Freq: Every day | ORAL | Status: DC
  Administered 2018-12-01 – 2018-12-03 (×3): 10 mg via ORAL
  Filled 2018-12-01 (×3): qty 2

## 2018-12-01 MED ORDER — IPRATROPIUM-ALBUTEROL 0.5-2.5 (3) MG/3ML IN SOLN
3.0000 mL | RESPIRATORY_TRACT | Status: DC
  Administered 2018-12-01 (×3): 3 mL via RESPIRATORY_TRACT
  Filled 2018-12-01 (×3): qty 3

## 2018-12-01 MED ORDER — BUDESONIDE 0.25 MG/2ML IN SUSP
0.2500 mg | Freq: Two times a day (BID) | RESPIRATORY_TRACT | Status: DC
  Administered 2018-12-01 – 2018-12-03 (×4): 0.25 mg via RESPIRATORY_TRACT
  Filled 2018-12-01 (×4): qty 2

## 2018-12-01 MED ORDER — INSULIN ASPART 100 UNIT/ML ~~LOC~~ SOLN
0.0000 [IU] | Freq: Three times a day (TID) | SUBCUTANEOUS | Status: DC
  Administered 2018-12-01: 18:00:00 2 [IU] via SUBCUTANEOUS
  Administered 2018-12-02: 3 [IU] via SUBCUTANEOUS
  Administered 2018-12-02: 5 [IU] via SUBCUTANEOUS
  Administered 2018-12-02: 7 [IU] via SUBCUTANEOUS
  Administered 2018-12-03: 09:00:00 5 [IU] via SUBCUTANEOUS
  Administered 2018-12-03: 7 [IU] via SUBCUTANEOUS
  Filled 2018-12-01 (×6): qty 1

## 2018-12-01 NOTE — ED Provider Notes (Signed)
[facility] Emergency Department Provider Note  ____________________________________________  Time seen: Approximately 11:03 AM  I have reviewed the triage vital signs and the nursing notes.   HISTORY  Chief Complaint Cough and Emesis    HPI [name] [name] is a 83 y.o. female with a history of hypertension diabetes heart failure and paroxysmal atrial fibrillation who complains of productive cough, congestion, nausea vomiting diarrhea and inability to take her medicines for the past 3 days.  Also feels very short of breath and wheezy.  Denies fever or chills.  No significant chest pain headache or neck stiffness.  Symptoms are intermittent, waxing waning, worse with eating, no alleviating factors      Past Medical History:  Diagnosis Date  . 1st degree AV block    a. PR 400 msec with Apacing  . Anemia   . Anxiety   . Arthritis    a. shoulders.  . Chest pain    a. 2004 Cath:  reportedly nl;  b. 09/2013 Neg MV.  . Chronic diastolic heart failure ([place])    a. 09/2013 Echo: EF 50-55%, mild LVH, mild to mod MR/TR.  Marland Kitchen Chronic kidney disease   . Congestive heart disease ([place])   . Esophageal reflux   . H/O hiatal hernia   . History of UTI   . Hyperlipidemia   . Morbid obesity ([place])   . OSA on CPAP   . PAF (paroxysmal atrial fibrillation) ([place])   . SSS (sick sinus syndrome) ([place])    a. 2008 s/p MDT PPM;  b. 11/2013 Gen change- MDT ADDRL1 Adapta DC PPM, ser # [id number] H.  . Symptomatic PVCs   . Syncope and collapse    a. Felt to be vasovagal.  . Thyroid disease    on no meds per pt  . Type II diabetes mellitus ([place])   . Unspecified essential hypertension   . Unspecified glaucoma(365.9)      Patient Active Problem List   Diagnosis Date Noted  . Eczema 04/03/2017  . Glaucoma (increased eye pressure) 04/03/2017  . Hiatal hernia 04/03/2017  . Iron deficiency anemia 04/03/2017  . Osteoarthritis 04/03/2017  . Pure hypercholesterolemia 04/03/2017  .  Tinnitus 04/03/2017  . Valvular heart disease 04/03/2017  . Venous stasis 04/03/2017  . Bilateral hearing loss 03/07/2017  . Tinnitus, bilateral 03/07/2017  . Pounding noise in right ear 02/13/2017  . Eczematous skin lesions 02/02/2017  . URI with cough and congestion 12/13/2016  . Colicky RLQ abdominal pain 11/01/2016  . 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 Anxiety 07/28/2016  . Acute maxillary sinusitis 03/23/2016  . Pain of right hip joint 03/23/2016  . CKD (chronic kidney disease) stage 3, GFR 30-59 ml/min (HCC) 02/03/2016  . Chronic gout of right foot 12/16/2015  . Productive cough 10/26/2015  . Infection of skin of left ear lobe 10/11/2015  . Acute non-recurrent maxillary sinusitis 09/17/2015  . Allergic rhinitis 09/14/2015  . Hyperlipidemia 09/14/2015  . Gout attack 07/21/2015  . Anticoagulation monitoring, INR range 2-3 05/18/2015  . RLS (restless legs syndrome) 04/14/2015  . History of colon polyps 12/23/2014  . Crohn's disease in remission ([place]) 09/15/2014  . 1st degree AV block   . Chronic diastolic heart failure ([place])   . Cardiac arrhythmia 12/01/2013  . Hemoptysis 11/24/2013  . Acute on chronic diastolic CHF (congestive heart failure) ([place]) 10/17/2013  . Episodic lightheadedness 07/16/2013  . Palpitations 07/16/2013  . Syncope 08/15/2012  . Back pain 05/30/2012  . OSA (obstructive sleep apnea)  01/08/2012  . DYSPNEA 01/28/2010  . PVC (premature ventricular contraction) 09/13/2009  . Flutter-fibrillation ([place]) 09/13/2009  . EDEMA 08/23/2009  . Diabetes mellitus type 2, controlled, without complications ([place]) 09/62/8366  . GLAUCOMA 04/13/2009  . Essential hypertension 04/13/2009  . PAF (paroxysmal atrial fibrillation) ([place]) 04/13/2009  . SICK SINUS/ TACHY-BRADY SYNDROME 04/13/2009  . GERD 04/13/2009  . Cardiac pacemaker - Medtronic 04/13/2009     Past Surgical History:  Procedure Laterality Date  . APPENDECTOMY    . CARDIOVERSION N/A 04/26/2017    Procedure: Cardioversion;  Surgeon: Minna Merritts, MD;  Location: [facility] ORS;  Service: Cardiovascular;  Laterality: N/A;  . DILATION AND CURETTAGE OF UTERUS    . INCISION AND DRAINAGE OF WOUND Right 03/1998   "leg; it was like a boil" (12/01/2013)  . INSERT / REPLACE / REMOVE PACEMAKER  2008; 12/01/2013   MDT ADDRL1 pacemaker - gen change by Dr Caryl Comes 12/01/2013  . LUNG BIOPSY  2010   [facility]  . PERMANENT PACEMAKER GENERATOR CHANGE N/A 12/01/2013   Procedure: PERMANENT PACEMAKER GENERATOR CHANGE;  Surgeon: Deboraha Sprang, MD;  Location: [facility] CATH LAB;  Service: Cardiovascular;  Laterality: N/A;  . [place]     Prior to Admission medications   Medication Sig Start Date End Date Taking? Authorizing Provider  acetaminophen (TYLENOL) 500 MG tablet Take 1,000 mg by mouth every 6 (six) hours as needed for moderate pain.     [provider]  allopurinol (ZYLOPRIM) 100 MG tablet Take 200 mg by mouth daily.    [provider]  amiodarone (PACERONE) 200 MG tablet Take 100 mg by mouth 2 (two) times daily.    [provider]  amLODipine (NORVASC) 10 MG tablet TAKE 1 TABLET (10 MG TOTAL) BY MOUTH DAILY. 12/20/16   Roselee Nova, MD  atorvastatin (LIPITOR) 20 MG tablet TAKE 1 TABLET (20 MG TOTAL) BY MOUTH DAILY. Patient taking differently: Take 20 mg by mouth at bedtime.  02/02/17   Roselee Nova, MD  Brinzolamide-Brimonidine [facility]) 1-0.2 % SUSP Place 1 drop into both eyes 2 (two) times daily. 07/05/15   Roselee Nova, MD  carvedilol (COREG) 12.5 MG tablet TAKE 1 TABLET BY MOUTH IN THE MORNING WITH BREAKFAST AND 2 TABLETS IN THE EVENING BEFORE BED Patient not taking: Reported on 11/23/2018 07/01/18   Minna Merritts, MD  carvedilol (COREG) 25 MG tablet Take 25-50 mg by mouth See admin instructions. Take 1 tablet (25MG ) by mouth every morning and 2 tablets (50MG ) by mouth every night before bed    [provider]  diclofenac sodium (VOLTAREN) 1  % GEL Apply 2 g topically 4 (four) times daily.    [provider]  esomeprazole (NEXIUM) 40 MG capsule TAKE 1 CAPSULE (40 MG TOTAL) BY MOUTH DAILY. 11/16/16   Roselee Nova, MD  ferrous sulfate 325 (65 FE) MG EC tablet Take 325 mg by mouth 2 (two) times daily with a meal.  03/01/17   [provider]  flecainide (TAMBOCOR) 100 MG tablet Take 1 tablet (100 mg total) by mouth 2 (two) times daily. 03/08/18   Minna Merritts, MD  flecainide (TAMBOCOR) 100 MG tablet TAKE 1 TABLET BY MOUTH TWICE A DAY 08/06/18   Minna Merritts, MD  furosemide (LASIX) 20 MG tablet Take 1 tablet (20 mg total) by mouth daily. Patient taking differently: Take 60 mg by mouth daily.  02/02/17   Roselee Nova, MD  glipiZIDE (GLUCOTROL) 10 MG tablet TAKE 1 TABLET (10 MG TOTAL) BY MOUTH 2 (TWO) TIMES DAILY BEFORE A MEAL. 01/29/17   Roselee Nova, MD  Glycerin, Laxative, (FLEET LIQUID GLYCERIN SUPP RE) Place 1 Dose rectally daily as needed (constipation).    [provider]  KLOR-CON M10 10 MEQ tablet Take 1 tablet by mouth daily. 06/12/18   [provider]  metFORMIN (GLUCOPHAGE-XR) 500 MG 24 hr tablet TAKE 1 TABLET (500 MG TOTAL) BY MOUTH 2 (TWO) TIMES DAILY. 02/02/17   Roselee Nova, MD  mometasone (ELOCON) 0.1 % cream Apply topically daily. Apply to affected area as directed two times a day Patient taking differently: Apply topically daily. Apply to affected area as directed two times a day as needed 02/02/17   Roselee Nova, MD  Multiple Vitamins-Minerals (CENTRUM SILVER PO) Take 1 tablet by mouth daily.     [provider]  olmesartan (BENICAR) 40 MG tablet TAKE 1 TABLET (40 MG TOTAL) BY MOUTH DAILY. 12/19/16   Roselee Nova, MD  polyethylene glycol [facility] / Floria Raveling) packet Take 17 g by mouth daily as needed for moderate constipation.    [provider]  TRADJENTA 5 MG TABS tablet TAKE 1 TABLET (5 MG TOTAL) BY MOUTH DAILY. Patient taking differently: TAKE  1 TABLET (5 MG TOTAL) BY MOUTH DAILY WITH BREAKFAST 12/07/16   Rochel Brome A, MD  travoprost, benzalkonium, (TRAVATAN) 0.004 % ophthalmic solution Place 1 drop into both eyes at bedtime.     [provider]  warfarin (COUMADIN) 1 MG tablet Take 3-4 mg by mouth daily. Take 3MG  by mouth every Monday, Tuesday, Wednesday, Thursday and Friday evening and take 4MG  by mouth every Saturday and Sunday evening    [provider]  warfarin (COUMADIN) 3 MG tablet TAKE 1-1.5 TABLETS DAILY AT 6 PM. TAKE 1 TAB ON TUES, THURS, SAT, SUN. TAKE 4.5 MG ON MON, WED, FRI Patient not taking: Reported on 11/23/2018 10/25/15   Roselee Nova, MD     Allergies Metoprolol; Diltiazem; and Levofloxacin   Family History  Problem Relation Age of Onset  . Heart disease Father   . Rheum arthritis Father   . Allergies Father   . Liver cancer Mother   . Pancreatic cancer Mother   . Hypertension Sister   . Rheum arthritis Sister   . Breast cancer Maternal Aunt     Social History Social History   Tobacco Use  . Smoking status: Former Smoker    Packs/day: 0.50    Years: 34.00    Pack years: 17.00    Types: Cigarettes    Last attempt to quit: 08/15/1989    Years since quitting: 29.3  . Smokeless tobacco: Never Used  Substance Use Topics  . Alcohol use: No    Alcohol/week: 0.0 standard drinks  . Drug use: No    Review of Systems  Constitutional:   No fever or chills.  ENT:   Positive sore throat.  Positive rhinorrhea. Cardiovascular:   No chest pain or syncope. Respiratory:   Positive shortness of breath and productive cough. Gastrointestinal:   Negative for abdominal pain, positive vomiting and diarrhea.  Musculoskeletal:   Negative for focal pain or swelling All other systems reviewed and are negative except as documented above in ROS and HPI.  ____________________________________________   PHYSICAL EXAM:  VITAL SIGNS: ED Triage Vitals  Enc Vitals Group     BP 12/01/18  0731 (!) 173/72  Pulse Rate 12/01/18 0727 62     Resp 12/01/18 0727 16     Temp 12/01/18 0727 98.5 F (36.9 C)     Temp Source 12/01/18 0727 Oral     SpO2 12/01/18 0727 99 %     Weight 12/01/18 0729 240 lb 4.8 oz (109 kg)     Height 12/01/18 0729 5\' 4"  (1.626 m)     Head Circumference --      Peak Flow --      Pain Score 12/01/18 0729 0     Pain Loc --      Pain Edu? --      Excl. in [place]? --     Vital signs reviewed, nursing assessments reviewed.   Constitutional:   Alert and oriented. Non-toxic appearance. Eyes:   Conjunctivae are normal. EOMI. PERRL. ENT      Head:   Normocephalic and atraumatic.      Nose:   No congestion/rhinnorhea.       Mouth/Throat:   Dry mucous membranes, no pharyngeal erythema. No peritonsillar mass.       Neck:   No meningismus. Full ROM. Hematological/Lymphatic/Immunilogical:   No cervical lymphadenopathy. Cardiovascular:   RRR. Symmetric bilateral radial and DP pulses.  No murmurs. Cap refill less than 2 seconds. Respiratory:   Normal respiratory effort without tachypnea/retractions. Breath sounds are clear and equal bilaterally. No wheezes/rales/rhonchi. Gastrointestinal:   Soft and nontender. Non distended. There is no CVA tenderness.  No rebound, rigidity, or guarding. Musculoskeletal:   Normal range of motion in all extremities. No joint effusions.  No lower extremity tenderness.  1+ pitting edema bilaterally neurologic:   Normal speech and language.  Motor grossly intact. No acute focal neurologic deficits are appreciated.  Skin:    Skin is warm, dry and intact. No rash noted.  No petechiae, purpura, or bullae.  ____________________________________________    LABS (pertinent positives/negatives) (all labs ordered are listed, but only abnormal results are displayed) Labs Reviewed  COMPREHENSIVE METABOLIC PANEL - Abnormal; Notable for the following components:      Result Value   Potassium 3.1 (*)    Glucose, Bld 111 (*)    Creatinine,  Ser 1.98 (*)    Calcium 8.7 (*)    Total Protein 6.3 (*)    Albumin 3.4 (*)    GFR calc non Af Amer 23 (*)    GFR calc Af Amer 27 (*)    All other components within normal limits  CBC - Abnormal; Notable for the following components:   Hemoglobin 10.3 (*)    HCT 31.6 (*)    MCV 76.0 (*)    MCH 24.8 (*)    RDW 17.1 (*)    All other components within normal limits  URINALYSIS, COMPLETE (UACMP) WITH MICROSCOPIC - Abnormal; Notable for the following components:   Color, Urine STRAW (*)    APPearance CLEAR (*)    All other components within normal limits  INFLUENZA PANEL BY PCR (TYPE A & B) - Abnormal; Notable for the following components:   Influenza A By PCR POSITIVE (*)    All other components within normal limits  LIPASE, BLOOD  PROCALCITONIN   ____________________________________________   EKG  Interpreted by me Ventricular paced rhythm converts to a atrial flutter rhythm during the EKG.  Rate is 63.  No intrinsic BBB. No acute ischemic changes.  ____________________________________________    RADIOLOGY  Dg Chest 2 View  Result Date: 12/01/2018 CLINICAL DATA:  Patient with productive cough and  shortness of breath EXAM: CHEST - 2 VIEW COMPARISON:  Chest radiograph 11/23/2018 FINDINGS: Monitoring leads overlie the patient. Multi lead pacer apparatus overlies the right hemithorax, leads are stable in position. Stable cardiomegaly. Tortuosity of the thoracic aorta. Postsurgical changes right lower lung. Heterogeneous opacities mid lower lungs bilaterally. Thoracic spine degenerative changes. IMPRESSION: Heterogeneous opacities left mid and lower lung as well as right lung base may represent atelectasis, scarring or infection. Electronically Signed   By: Lovey Newcomer M.D.   On: 12/01/2018 08:55    ____________________________________________   PROCEDURES Procedures  ____________________________________________  DIFFERENTIAL DIAGNOSIS   Influenza, pneumonia, heart  failure, dehydration, other viral syndrome  CLINICAL IMPRESSION / ASSESSMENT AND PLAN / ED COURSE  Pertinent labs & imaging results that were available during my care of the patient were reviewed by me and considered in my medical decision making (see chart for details).    Patient presents with constellation of symptoms likely to be a viral syndrome.  Possibly an exacerbation of her heart failure or pneumonia.  Check a chest x-ray and labs and flu test.  Gentle IV fluids as a trial to see if hydration helps her feel better as well as Zofran  Clinical Course as of Dec 01 1213  Sun Dec 01, 2018  0938 Labs show positive for influenza A, stable baseline anemia with a hemoglobin of 10, slight worsening of her CKD from a baseline creatinine of 1.6 to now 2.0 in the setting of dehydration from vomiting.  Chest x-ray shows some infiltrates consistent with pneumonitis.  Patient's been given albuterol Tessalon Zofran and IV fluids.  If symptomatically improved and comfortable with discharge home I think she is suitable for outpatient follow-up given that she is a week into her illness.  However if symptoms are persistent and continued to be worrisome for her I will discuss with the hospitalist for observation.   [PS]    Clinical Course User Index [PS] Carrie Mew, MD    ----------------------------------------- 12:21 PM on 12/01/2018 -----------------------------------------  Patient not feeling any better, still nauseated and unable to take anything by mouth.  Additional fluids given, start due to age even though she has been having symptoms for greater than 72 hours.  Hospitalist consulted.   ____________________________________________   FINAL CLINICAL IMPRESSION(S) / ED DIAGNOSES    Final diagnoses:  Influenza A  Pneumonitis  CKD (chronic kidney disease) stage 3, GFR 30-59 ml/min (HCC)  Type 2 diabetes mellitus with stage 3 chronic kidney disease, without long-term current use of  insulin [facility])     ED Discharge Orders    None      Portions of this note were generated with dragon dictation software. Dictation errors may occur despite best attempts at proofreading.   Carrie Mew, MD 12/01/18 [phone]

## 2018-12-01 NOTE — H&P (Addendum)
[facility] NAME: [name] [name]    MR#:  [id number]  DATE OF BIRTH:  September 19, 1935  DATE OF ADMISSION:  12/01/2018  PRIMARY CARE PHYSICIAN: Dion Body, MD   REQUESTING/REFERRING PHYSICIAN:  Carrie Mew, MD     CHIEF COMPLAINT:   Chief Complaint  Patient presents with  . Cough  . Emesis    HISTORY OF PRESENT ILLNESS: [name] [name]  is a 83 y.o. female with a known history of arthritis, chronic diastolic heart failure, chronic kidney disease, hyperlipidemia, sleep apnea, morbid obesity, paroxysmal atrial fibrillation and diabetes type 2 who is presenting to the hospital with complaint of nausea vomiting feeling weak for the last 1 week.  Patient also started having shortness of breath for the past few days as well as having coughing and wheezing.  He has had some fevers at home as well.  Patient in the ER is noted to have influenza A as well as bronchospasm. PAST MEDICAL HISTORY:   Past Medical History:  Diagnosis Date  . 1st degree AV block    a. PR 400 msec with Apacing  . Anemia   . Anxiety   . Arthritis    a. shoulders.  . Chest pain    a. 2004 Cath:  reportedly nl;  b. 09/2013 Neg MV.  . Chronic diastolic heart failure ([place])    a. 09/2013 Echo: EF 50-55%, mild LVH, mild to mod MR/TR.  Marland Kitchen Chronic kidney disease   . Congestive heart disease ([place])   . Esophageal reflux   . H/O hiatal hernia   . History of UTI   . Hyperlipidemia   . Morbid obesity ([place])   . OSA on CPAP   . PAF (paroxysmal atrial fibrillation) ([place])   . SSS (sick sinus syndrome) ([place])    a. 2008 s/p MDT PPM;  b. 11/2013 Gen change- MDT ADDRL1 Adapta DC PPM, ser # [id number] H.  . Symptomatic PVCs   . Syncope and collapse    a. Felt to be vasovagal.  . Thyroid disease    on no meds per pt  . Type II diabetes mellitus ([place])   . Unspecified essential hypertension   . Unspecified glaucoma(365.9)     PAST SURGICAL HISTORY:  Past Surgical History:   Procedure Laterality Date  . APPENDECTOMY    . CARDIOVERSION N/A 04/26/2017   Procedure: Cardioversion;  Surgeon: Minna Merritts, MD;  Location: [facility] ORS;  Service: Cardiovascular;  Laterality: N/A;  . DILATION AND CURETTAGE OF UTERUS    . INCISION AND DRAINAGE OF WOUND Right 03/1998   "leg; it was like a boil" (12/01/2013)  . INSERT / REPLACE / REMOVE PACEMAKER  2008; 12/01/2013   MDT ADDRL1 pacemaker - gen change by Dr Caryl Comes 12/01/2013  . LUNG BIOPSY  2010   [facility]  . PERMANENT PACEMAKER GENERATOR CHANGE N/A 12/01/2013   Procedure: PERMANENT PACEMAKER GENERATOR CHANGE;  Surgeon: Deboraha Sprang, MD;  Location: [facility] CATH LAB;  Service: Cardiovascular;  Laterality: N/A;  . VAGINAL HYSTERECTOMY  1970    SOCIAL HISTORY:  Social History   Tobacco Use  . Smoking status: Former Smoker    Packs/day: 0.50    Years: 34.00    Pack years: 17.00    Types: Cigarettes    Last attempt to quit: 08/15/1989    Years since quitting: 29.3  . Smokeless tobacco: Never Used  Substance Use Topics  . Alcohol use: No    Alcohol/week: 0.0 standard drinks  FAMILY HISTORY:  Family History  Problem Relation Age of Onset  . Heart disease Father   . Rheum arthritis Father   . Allergies Father   . Liver cancer Mother   . Pancreatic cancer Mother   . Hypertension Sister   . Rheum arthritis Sister   . Breast cancer Maternal Aunt     DRUG ALLERGIES:  Allergies  Allergen Reactions  . Metoprolol Other (See Comments)    Bad dreams Patient stated that she had suicidal thoughts while taking this medication. Other reaction(s): Other (See Comments) Other reaction(s): Other (See Comments) Bad dreams Patient stated that she had suicidal thoughts while taking this medication. Bad dreams Patient stated that she had suicidal thoughts while taking this medication. Bad dreams Patient stated that she had suicidal thoughts while taking this medication. Other reaction(s): Other (See Comments) Bad dreams   .  Diltiazem Other (See Comments)    Patient stated that she had bad dreams with this medication Other reaction(s): Other (See Comments) Patient stated that she had bad dreams with this medication Other reaction(s): Other (See Comments) Patient stated that she had bad dreams with this medication Patient stated that she had bad dreams with this medication Patient stated that she had bad dreams with this medication  . Levofloxacin Nausea And Vomiting and Nausea Only    Other reaction(s): Nausea And Vomiting    REVIEW OF SYSTEMS:   CONSTITUTIONAL: Positive fever, positive fatigue or positive weakness.  EYES: No blurred or double vision.  EARS, NOSE, AND THROAT: No tinnitus or ear pain.  RESPIRATORY: Positive cough, positive shortness of breath, positive wheezing no hemoptysis.  CARDIOVASCULAR: No chest pain, orthopnea, edema.  GASTROINTESTINAL: No nausea, vomiting, diarrhea or abdominal pain.  GENITOURINARY: No dysuria, hematuria.  ENDOCRINE: No polyuria, nocturia,  HEMATOLOGY: No anemia, easy bruising or bleeding SKIN: No rash or lesion. MUSCULOSKELETAL: No joint pain or arthritis.   NEUROLOGIC: No tingling, numbness, weakness.  PSYCHIATRY: No anxiety or depression.   MEDICATIONS AT HOME:  Prior to Admission medications   Medication Sig Start Date End Date Taking? Authorizing Provider  acetaminophen (TYLENOL) 500 MG tablet Take 1,000 mg by mouth every 6 (six) hours as needed for moderate pain.     [provider]  allopurinol (ZYLOPRIM) 100 MG tablet Take 200 mg by mouth daily.    [provider]  amiodarone (PACERONE) 200 MG tablet Take 100 mg by mouth 2 (two) times daily.    [provider]  amLODipine (NORVASC) 10 MG tablet TAKE 1 TABLET (10 MG TOTAL) BY MOUTH DAILY. 12/20/16   Roselee Nova, MD  atorvastatin (LIPITOR) 20 MG tablet TAKE 1 TABLET (20 MG TOTAL) BY MOUTH DAILY. Patient taking differently: Take 20 mg by mouth at bedtime.  02/02/17   Roselee Nova, MD  Brinzolamide-Brimonidine [facility]) 1-0.2 % SUSP Place 1 drop into both eyes 2 (two) times daily. 07/05/15   Roselee Nova, MD  carvedilol (COREG) 12.5 MG tablet TAKE 1 TABLET BY MOUTH IN THE MORNING WITH BREAKFAST AND 2 TABLETS IN THE EVENING BEFORE BED Patient not taking: Reported on 11/23/2018 07/01/18   Minna Merritts, MD  carvedilol (COREG) 25 MG tablet Take 25-50 mg by mouth See admin instructions. Take 1 tablet (25MG ) by mouth every morning and 2 tablets (50MG ) by mouth every night before bed    [provider]  diclofenac sodium (VOLTAREN) 1 % GEL Apply 2 g topically 4 (four) times daily.    [provider]  esomeprazole (NEXIUM) 40 MG capsule TAKE 1 CAPSULE (40 MG TOTAL) BY MOUTH DAILY. 11/16/16   Roselee Nova, MD  ferrous sulfate 325 (65 FE) MG EC tablet Take 325 mg by mouth 2 (two) times daily with a meal.  03/01/17   [provider]  flecainide (TAMBOCOR) 100 MG tablet Take 1 tablet (100 mg total) by mouth 2 (two) times daily. 03/08/18   Minna Merritts, MD  flecainide (TAMBOCOR) 100 MG tablet TAKE 1 TABLET BY MOUTH TWICE A DAY 08/06/18   Minna Merritts, MD  furosemide (LASIX) 20 MG tablet Take 1 tablet (20 mg total) by mouth daily. Patient taking differently: Take 60 mg by mouth daily.  02/02/17   Rochel Brome A, MD  glipiZIDE (GLUCOTROL) 10 MG tablet TAKE 1 TABLET (10 MG TOTAL) BY MOUTH 2 (TWO) TIMES DAILY BEFORE A MEAL. 01/29/17   Roselee Nova, MD  Glycerin, Laxative, (FLEET LIQUID GLYCERIN SUPP RE) Place 1 Dose rectally daily as needed (constipation).    [provider]  KLOR-CON M10 10 MEQ tablet Take 1 tablet by mouth daily. 06/12/18   [provider]  metFORMIN (GLUCOPHAGE-XR) 500 MG 24 hr tablet TAKE 1 TABLET (500 MG TOTAL) BY MOUTH 2 (TWO) TIMES DAILY. 02/02/17   Roselee Nova, MD  mometasone (ELOCON) 0.1 % cream Apply topically daily. Apply to affected area as directed two times a day Patient taking  differently: Apply topically daily. Apply to affected area as directed two times a day as needed 02/02/17   Roselee Nova, MD  Multiple Vitamins-Minerals (CENTRUM SILVER PO) Take 1 tablet by mouth daily.     [provider]  olmesartan (BENICAR) 40 MG tablet TAKE 1 TABLET (40 MG TOTAL) BY MOUTH DAILY. 12/19/16   Roselee Nova, MD  polyethylene glycol [facility] Floria Raveling) packet Take 17 g by mouth daily as needed for moderate constipation.    [provider]  TRADJENTA 5 MG TABS tablet TAKE 1 TABLET (5 MG TOTAL) BY MOUTH DAILY. Patient taking differently: TAKE 1 TABLET (5 MG TOTAL) BY MOUTH DAILY WITH BREAKFAST 12/07/16   Rochel Brome A, MD  travoprost, benzalkonium, (TRAVATAN) 0.004 % ophthalmic solution Place 1 drop into both eyes at bedtime.     [provider]  warfarin (COUMADIN) 1 MG tablet Take 3-4 mg by mouth daily. Take 3MG  by mouth every Monday, Tuesday, Wednesday, Thursday and Friday evening and take 4MG  by mouth every Saturday and Sunday evening    [provider]  warfarin (COUMADIN) 3 MG tablet TAKE 1-1.5 TABLETS DAILY AT 6 PM. TAKE 1 TAB ON TUES, THURS, SAT, SUN. TAKE 4.5 MG ON MON, WED, FRI Patient not taking: Reported on 11/23/2018 10/25/15   Roselee Nova, MD      PHYSICAL EXAMINATION:   VITAL SIGNS: Blood pressure (!) 176/74, pulse 66, temperature 98.8 F (37.1 C), temperature source Oral, resp. rate 13, height 5\' 4"  (1.626 m), weight 109 kg, SpO2 95 %.  GENERAL:  83 y.o.-year-old patient lying in the bed with no acute distress.  EYES: Pupils equal, round, reactive to light and accommodation. No scleral icterus. Extraocular muscles intact.  HEENT: Head atraumatic, normocephalic. Oropharynx and nasopharynx clear.  NECK:  Supple, no jugular venous distention. No thyroid enlargement, no tenderness.  LUNGS: Bilateral wheezing throughout both lungs no accessory muscle usage CARDIOVASCULAR: S1, S2 normal. No murmurs, rubs, or gallops.   ABDOMEN: Soft, nontender, nondistended.  Bowel sounds present. No organomegaly or mass.  EXTREMITIES: No pedal edema, cyanosis, or clubbing.  NEUROLOGIC: Cranial nerves II through XII are intact. Muscle strength 5/5 in all extremities. Sensation intact. Gait not checked.  PSYCHIATRIC: The patient is alert and oriented x 3.  SKIN: No obvious rash, lesion, or ulcer.   LABORATORY PANEL:   CBC Recent Labs  Lab 12/01/18 0732  WBC 4.8  HGB 10.3*  HCT 31.6*  PLT 188  MCV 76.0*  MCH 24.8*  MCHC 32.6  RDW 17.1*   ------------------------------------------------------------------------------------------------------------------  Chemistries  Recent Labs  Lab 12/01/18 0732  NA 142  K 3.1*  CL 107  CO2 26  GLUCOSE 111*  BUN 19  CREATININE 1.98*  CALCIUM 8.7*  AST 26  ALT 23  ALKPHOS 48  BILITOT 1.1   ------------------------------------------------------------------------------------------------------------------ estimated creatinine clearance is 26.9 mL/min (A) (by C-G formula based on SCr of 1.98 mg/dL (H)). ------------------------------------------------------------------------------------------------------------------ No results for input(s): TSH, T4TOTAL, T3FREE, THYROIDAB in the last 72 hours.  Invalid input(s): FREET3   Coagulation profile No results for input(s): INR, PROTIME in the last 168 hours. ------------------------------------------------------------------------------------------------------------------- No results for input(s): DDIMER in the last 72 hours. -------------------------------------------------------------------------------------------------------------------  Cardiac Enzymes No results for input(s): CKMB, TROPONINI, MYOGLOBIN in the last 168 hours.  Invalid input(s): CK ------------------------------------------------------------------------------------------------------------------ Invalid input(s):  POCBNP  ---------------------------------------------------------------------------------------------------------------  Urinalysis    Component Value Date/Time   COLORURINE STRAW (A) 12/01/2018 0828   APPEARANCEUR CLEAR (A) 12/01/2018 0828   APPEARANCEUR Clear 08/03/2014 1622   LABSPEC 1.006 12/01/2018 0828   LABSPEC 1.004 08/03/2014 1622   PHURINE 7.0 12/01/2018 0828   GLUCOSEU NEGATIVE 12/01/2018 0828   GLUCOSEU Negative 08/03/2014 1622   HGBUR NEGATIVE 12/01/2018 0828   BILIRUBINUR NEGATIVE 12/01/2018 0828   BILIRUBINUR Negative 08/03/2014 1622   KETONESUR NEGATIVE 12/01/2018 0828   PROTEINUR NEGATIVE 12/01/2018 0828   NITRITE NEGATIVE 12/01/2018 0828   LEUKOCYTESUR NEGATIVE 12/01/2018 0828   LEUKOCYTESUR Negative 08/03/2014 1622     RADIOLOGY: Dg Chest 2 View  Result Date: 12/01/2018 CLINICAL DATA:  Patient with productive cough and shortness of breath EXAM: CHEST - 2 VIEW COMPARISON:  Chest radiograph 11/23/2018 FINDINGS: Monitoring leads overlie the patient. Multi lead pacer apparatus overlies the right hemithorax, leads are stable in position. Stable cardiomegaly. Tortuosity of the thoracic aorta. Postsurgical changes right lower lung. Heterogeneous opacities mid lower lungs bilaterally. Thoracic spine degenerative changes. IMPRESSION: Heterogeneous opacities left mid and lower lung as well as right lung base may represent atelectasis, scarring or infection. Electronically Signed   By: Lovey Newcomer M.D.   On: 12/01/2018 08:55    EKG: Orders placed or performed during the hospital encounter of 12/01/18  . EKG 12-Lead  . EKG 12-Lead    IMPRESSION AND PLAN: Patient is 83 year old presenting with shortness of breath nausea vomiting  1.  Acute bronchospasm likely related to influenza A We will treat with nebulizer therapy, Solu-Medrol, and oxygen therapy Chest x-ray is abnormal I will check a procalcitonin level She may also have concurrent acute bronchitis I will treat  with oral doxycycline  2.  Influenza A we will treat with Tamiflu  3 acute kidney injury on chronic kidney disease stage III we will give her IV fluids monitor renal function   4.  Diabetes type 2 we will place sliding scale insulin continue Glucotrol discontinue metformin due to elevated creatinine, continue Tradjenta   5.  Paroxysmal atrial fibrillation continue amiodarone, pharmacy consult for Coumadin management  6.  Hyperlipidemia continue Lipitor  7.  GERD continue Nexium  8.  Hypertension continue Benicar  9.  Glaucoma continue eyedrops  All the records are reviewed and case discussed with ED provider. Management plans discussed with the patient, family and they are in agreement.  CODE STATUS: Code Status History    Date Active Date Inactive Code Status Order ID Comments User Context   12/01/2013 1300 12/02/2013 1423 Full Code [id number]  Deboraha Sprang, MD Inpatient       TOTAL TIME TAKING CARE OF THIS PATIENT:55 minutes.    Dustin Flock M.D on 12/01/2018 at 12:04 PM  Between 7am to 6pm - Pager - [phone]  After 6pm go to [email or web address] - password [email or web address]  [phone]  CC: Primary care physician; Dion Body, MD

## 2018-12-01 NOTE — Progress Notes (Signed)
CRITICAL VALUE ALERT  Critical Value:  INR  Date & Time Notied: 12/01/18 1519  Provider Notified: Dustin Flock   Orders Received/Actions taken: [facility]

## 2018-12-01 NOTE — Progress Notes (Signed)
Advanced care plan.  Purpose of the Encounter: CODE STATUS  Parties in Attendance: Patient herself  Patient's Decision Capacity: Intact  Subjective/Patient's story: [name] [name]  is a 83 y.o. female with a known history of arthritis, chronic diastolic heart failure, chronic kidney disease, hyperlipidemia, sleep apnea, morbid obesity, paroxysmal atrial fibrillation and diabetes type 2 who is presenting to the hospital with complaint of nausea vomiting feeling weak for the last 1 week.  She is noticed to have influenza A as well as bronchospasm   Objective/Medical story  Due to her advanced age I discussed with the patient's desire for cardiac and pulmonary resuscitation Goals of care determination:   Patient states that she would like everything to be done including cardiac and pulmonary resuscitation  CODE STATUS:  Full code  Time spent discussing advanced care planning: 16 minutes

## 2018-12-01 NOTE — ED Notes (Signed)
Dr. Joni Fears in room to reassess patient.  Will continue to monitor.

## 2018-12-01 NOTE — Consult Note (Signed)
ANTICOAGULATION CONSULT NOTE - Initial Consult  Pharmacy Consult for Warfarin Dosing and Monitoring  Indication: atrial fibrillation  Patient Measurements: Height: 5\' 4"  (162.6 cm) Weight: 240 lb 4.8 oz (109 kg) IBW/kg (Calculated) : 54.7  Vital Signs: Temp: 98.7 F (37.1 C) (12/29 1337) Temp Source: Oral (12/29 1337) BP: 182/85 (12/29 1337) Pulse Rate: 62 (12/29 1337)  Labs: Recent Labs    12/01/18 0732  HGB 10.3*  HCT 31.6*  PLT 188  CREATININE 1.98*    Estimated Creatinine Clearance: 26.9 mL/min (A) (by C-G formula based on SCr of 1.98 mg/dL (H)).   Medical History: Past Medical History:  Diagnosis Date  . 1st degree AV block    a. PR 400 msec with Apacing  . Anemia   . Anxiety   . Arthritis    a. shoulders.  . Chest pain    a. 2004 Cath:  reportedly nl;  b. 09/2013 Neg MV.  . Chronic diastolic heart failure ([place])    a. 09/2013 Echo: EF 50-55%, mild LVH, mild to mod MR/TR.  Marland Kitchen Chronic kidney disease   . Congestive heart disease ([place])   . Esophageal reflux   . H/O hiatal hernia   . History of UTI   . Hyperlipidemia   . Morbid obesity ([place])   . OSA on CPAP   . PAF (paroxysmal atrial fibrillation) ([place])   . SSS (sick sinus syndrome) ([place])    a. 2008 s/p MDT PPM;  b. 11/2013 Gen change- MDT ADDRL1 Adapta DC PPM, ser # [id number] H.  . Symptomatic PVCs   . Syncope and collapse    a. Felt to be vasovagal.  . Thyroid disease    on no meds per pt  . Type II diabetes mellitus ([place])   . Unspecified essential hypertension   . Unspecified glaucoma(365.9)     Assessment: Pharmacy consulted for warfarin dosing and monitoring in 83 yo female w/PMH of A. Fib. Patient admitted for acute bronchospasm and influenza A. Doxycycline and oseltamivir ([place]) ordered on admission. Last warfarin dose reported to be 12/28. INR supratherapeutic on admission @ 5.07  Home Regimen: Warfarin 3mg  T,TH, Sat, Sun                              Warfarin 4.5mg   M,W,F  DATE INR DOSE 12/29 5.07    Goal of Therapy:  INR 2-3 Monitor platelets by anticoagulation protocol: Yes   Plan:  12/29 INR 5.07. level is supratherapeutic.  Oseltamivir may also further elevate INR and increase risk of bleeding.   Will hold warfarin doses until INR closer to therapeutic range.   INRs daily while on antibiotics per protocol.  Pernell Dupre, PharmD, BCPS Clinical Pharmacist 12/01/2018 2:26 PM

## 2018-12-01 NOTE — ED Triage Notes (Signed)
Pt to ED via POV c/o cough, congestion and vomiting. Pt states that she has been vomiting since Thursday. Pt report that she is unable to keep to anything on her stomach. Pt is also c/o wheezing. Pt is in NAD at this time.

## 2018-12-01 NOTE — Consult Note (Signed)
PHARMACIST - PHYSICIAN COMMUNICATION  CONCERNING:  Oseltamivir (Tamiflu)  DESCRIPTION:  Oseltamivir 75mg  BID x 5 days is currently ordered.    This patient has an order for Oseltamivir (Tamiflu) and has an Estimated Creatinine Clearance: 26.9 mL/min (A) (by C-G formula based on SCr of 1.98 mg/dL (H)).   The package insert for Oseltamivir recommends 30mg  QD x 5 days for patients with CrCl <30 ml/min.  RECOMMENDATION: Dose has been changed to Oseltamivir 30mg  PO DAILY x 5 days.  Pernell Dupre, PharmD, BCPS Clinical Pharmacist 12/01/2018 2:02 PM

## 2018-12-01 NOTE — ED Notes (Signed)
Report given to floor

## 2018-12-02 LAB — BASIC METABOLIC PANEL
Anion gap: 11 (ref 5–15)
BUN: 16 mg/dL (ref 8–23)
CO2: 23 mmol/L (ref 22–32)
Calcium: 8.6 mg/dL — ABNORMAL LOW (ref 8.9–10.3)
Chloride: 107 mmol/L (ref 98–111)
Creatinine, Ser: 1.52 mg/dL — ABNORMAL HIGH (ref 0.44–1.00)
GFR calc Af Amer: 37 mL/min — ABNORMAL LOW (ref >60)
GFR calc non Af Amer: 32 mL/min — ABNORMAL LOW (ref >60)
Glucose, Bld: 243 mg/dL — ABNORMAL HIGH (ref 70–99)
Potassium: 3.3 mmol/L — ABNORMAL LOW (ref 3.5–5.1)
Sodium: 141 mmol/L (ref 135–145)

## 2018-12-02 LAB — GLUCOSE, CAPILLARY
Glucose-Capillary: 219 mg/dL — ABNORMAL HIGH (ref 70–99)
Glucose-Capillary: 253 mg/dL — ABNORMAL HIGH (ref 70–99)
Glucose-Capillary: 307 mg/dL — ABNORMAL HIGH (ref 70–99)
Glucose-Capillary: 275 mg/dL — ABNORMAL HIGH (ref 70–99)

## 2018-12-02 LAB — PROTIME-INR
INR: 5.76
Prothrombin Time: 50.9 seconds — ABNORMAL HIGH (ref 11.4–15.2)

## 2018-12-02 LAB — CBC
HCT: 32.7 % — ABNORMAL LOW (ref 36.0–46.0)
Hemoglobin: 10.5 g/dL — ABNORMAL LOW (ref 12.0–15.0)
MCH: 24.6 pg — ABNORMAL LOW (ref 26.0–34.0)
MCHC: 32.1 g/dL (ref 30.0–36.0)
MCV: 76.8 fL — ABNORMAL LOW (ref 80.0–100.0)
Platelets: 201 10*3/uL (ref 150–400)
RBC: 4.26 MIL/uL (ref 3.87–5.11)
RDW: 17.1 % — ABNORMAL HIGH (ref 11.5–15.5)
WBC: 4.6 10*3/uL (ref 4.0–10.5)
nRBC: 0 % (ref 0.0–0.2)

## 2018-12-02 MED ORDER — IPRATROPIUM-ALBUTEROL 0.5-2.5 (3) MG/3ML IN SOLN
3.0000 mL | Freq: Four times a day (QID) | RESPIRATORY_TRACT | Status: DC
  Administered 2018-12-02 (×2): 3 mL via RESPIRATORY_TRACT
  Filled 2018-12-02 (×2): qty 3

## 2018-12-02 MED ORDER — IPRATROPIUM-ALBUTEROL 0.5-2.5 (3) MG/3ML IN SOLN
3.0000 mL | Freq: Three times a day (TID) | RESPIRATORY_TRACT | Status: DC
  Administered 2018-12-03 (×2): 3 mL via RESPIRATORY_TRACT
  Filled 2018-12-02 (×2): qty 3

## 2018-12-02 MED ORDER — OSELTAMIVIR PHOSPHATE 30 MG PO CAPS
30.0000 mg | ORAL_CAPSULE | Freq: Two times a day (BID) | ORAL | Status: DC
  Administered 2018-12-02 – 2018-12-03 (×2): 30 mg via ORAL
  Filled 2018-12-02 (×3): qty 1

## 2018-12-02 MED ORDER — METHYLPREDNISOLONE SODIUM SUCC 125 MG IJ SOLR
60.0000 mg | Freq: Two times a day (BID) | INTRAMUSCULAR | Status: DC
  Administered 2018-12-03 (×2): 60 mg via INTRAVENOUS
  Filled 2018-12-02 (×2): qty 2

## 2018-12-02 MED ORDER — POTASSIUM CHLORIDE CRYS ER 20 MEQ PO TBCR
40.0000 meq | EXTENDED_RELEASE_TABLET | Freq: Once | ORAL | Status: AC
  Administered 2018-12-02: 14:00:00 40 meq via ORAL
  Filled 2018-12-02: qty 2

## 2018-12-02 MED ORDER — AMIODARONE HCL 200 MG PO TABS
100.0000 mg | ORAL_TABLET | Freq: Every day | ORAL | Status: DC
  Administered 2018-12-03: 09:00:00 100 mg via ORAL
  Filled 2018-12-02: qty 1

## 2018-12-02 NOTE — Consult Note (Signed)
PHARMACIST - PHYSICIAN COMMUNICATION  CONCERNING:  Oseltamivir (Tamiflu)  DESCRIPTION:  Oseltamivir 30 mg QD x 5 days is currently ordered.    This patient has an order for Oseltamivir (Tamiflu) and has an Estimated Creatinine Clearance: 35 mL/min (A) (by C-G formula based on SCr of 1.52 mg/dL (H)).   The package insert for Oseltamivir recommends 30mg  BID x 5 days for patients with CrCl 30-60 ml/min.  RECOMMENDATION: Dose has been changed to Oseltamivir 30mg  PO twice daily x 5 days.  Forrest Moron, PharmD Clinical Pharmacist 12/02/2018 10:16 AM

## 2018-12-02 NOTE — Consult Note (Signed)
ANTICOAGULATION CONSULT NOTE - Initial Consult  Pharmacy Consult for Warfarin Dosing and Monitoring  Indication: atrial fibrillation  Patient Measurements: Height: 5\' 4"  (162.6 cm) Weight: 240 lb 4.8 oz (109 kg) IBW/kg (Calculated) : 54.7  Vital Signs: Temp: 97.8 F (36.6 C) (12/30 0512) Temp Source: Oral (12/30 0512) BP: 138/77 (12/30 0512) Pulse Rate: 63 (12/30 0512)  Labs: Recent Labs    12/01/18 0732 12/01/18 1356 12/02/18 0427 12/02/18 0736  HGB 10.3*  --  10.5*  --   HCT 31.6*  --  32.7*  --   PLT 188  --  201  --   LABPROT  --  46.1*  --  50.9*  INR  --  5.07*  --  5.76*  CREATININE 1.98*  --  1.52*  --     Estimated Creatinine Clearance: 35 mL/min (A) (by C-G formula based on SCr of 1.52 mg/dL (H)).   Medical History: Past Medical History:  Diagnosis Date  . 1st degree AV block    a. PR 400 msec with Apacing  . Anemia   . Anxiety   . Arthritis    a. shoulders.  . Chest pain    a. 2004 Cath:  reportedly nl;  b. 09/2013 Neg MV.  . Chronic diastolic heart failure ([place])    a. 09/2013 Echo: EF 50-55%, mild LVH, mild to mod MR/TR.  Marland Kitchen Chronic kidney disease   . Congestive heart disease ([place])   . Esophageal reflux   . H/O hiatal hernia   . History of UTI   . Hyperlipidemia   . Morbid obesity ([place])   . OSA on CPAP   . PAF (paroxysmal atrial fibrillation) ([place])   . SSS (sick sinus syndrome) ([place])    a. 2008 s/p MDT PPM;  b. 11/2013 Gen change- MDT ADDRL1 Adapta DC PPM, ser # [id number] H.  . Symptomatic PVCs   . Syncope and collapse    a. Felt to be vasovagal.  . Thyroid disease    on no meds per pt  . Type II diabetes mellitus ([place])   . Unspecified essential hypertension   . Unspecified glaucoma(365.9)     Assessment: Pharmacy consulted for warfarin dosing and monitoring in 83 yo female w/PMH of A. Fib. Patient admitted for acute bronchospasm and influenza A. Doxycycline and oseltamivir ([place]) ordered on admission. Last warfarin dose reported to  be 12/28. INR supratherapeutic on admission @ 5.07  Home Regimen: Warfarin 3mg  T,TH, Sat, Sun                              Warfarin 4.5mg  M,W,F  DATE INR DOSE 12/29 5.07 Held 12/30 5.76 Held   Goal of Therapy:  INR 2-3 Monitor platelets by anticoagulation protocol: Yes   Plan:  12/30 INR 5.76. level is supratherapeutic.  Oseltamivir may also further elevate INR and increase risk of bleeding.   Will hold warfarin doses until INR closer to therapeutic range.   INRs daily while on antibiotics per protocol.  Forrest Moron, PharmD Clinical Pharmacist 12/02/2018 10:14 AM

## 2018-12-02 NOTE — Progress Notes (Signed)
[facility] [place] NAME: [name] [name]    MR#:  [id number]  DATE OF BIRTH:  06-24-1935  SUBJECTIVE:   Patient here due to shortness of breath, cough and respiratory failure secondary to flu.  Still having some cough and rattling but improved since yesterday.  No chest pains, abdominal pain nausea or vomiting or any other symptoms.  REVIEW OF SYSTEMS:    Review of Systems  Constitutional: Negative for chills and fever.  HENT: Negative for congestion and tinnitus.   Eyes: Negative for blurred vision and double vision.  Respiratory: Positive for cough, sputum production and shortness of breath. Negative for wheezing.   Cardiovascular: Negative for chest pain, orthopnea and PND.  Gastrointestinal: Negative for abdominal pain, diarrhea, nausea and vomiting.  Genitourinary: Negative for dysuria and hematuria.  Neurological: Negative for dizziness, sensory change and focal weakness.  All other systems reviewed and are negative.   Nutrition: Heart Healthy/Carb control.  Tolerating Diet: Yes Tolerating PT:  Await Eval.   DRUG ALLERGIES:   Allergies  Allergen Reactions  . Metoprolol Other (See Comments)    Bad dreams Patient stated that she had suicidal thoughts while taking this medication. Other reaction(s): Other (See Comments) Other reaction(s): Other (See Comments) Bad dreams Patient stated that she had suicidal thoughts while taking this medication. Bad dreams Patient stated that she had suicidal thoughts while taking this medication. Bad dreams Patient stated that she had suicidal thoughts while taking this medication. Other reaction(s): Other (See Comments) Bad dreams   . Diltiazem Other (See Comments)    Patient stated that she had bad dreams with this medication Other reaction(s): Other (See Comments) Patient stated that she had bad dreams with this medication Other reaction(s): Other (See Comments) Patient stated that  she had bad dreams with this medication Patient stated that she had bad dreams with this medication Patient stated that she had bad dreams with this medication  . Levofloxacin Nausea And Vomiting and Nausea Only    Other reaction(s): Nausea And Vomiting    VITALS:  Blood pressure 138/77, pulse 63, temperature 97.8 F (36.6 C), temperature source Oral, resp. rate 20, height 5\' 4"  (1.626 m), weight 109 kg, SpO2 99 %.  PHYSICAL EXAMINATION:   Physical Exam  GENERAL:  83 y.o.-year-old obese patient lying in bed in no acute distress.  EYES: Pupils equal, round, reactive to light and accommodation. No scleral icterus. Extraocular muscles intact.  HEENT: Head atraumatic, normocephalic. Oropharynx and nasopharynx clear.  NECK:  Supple, no jugular venous distention. No thyroid enlargement, no tenderness.  LUNGS: Good air entry bilaterally, diffuse end expiratory wheezing b/l and minimal rhonchi, no rales.  Negative use of accessory muscles. CARDIOVASCULAR: S1, S2 normal. No murmurs, rubs, or gallops.  ABDOMEN: Soft, nontender, nondistended. Bowel sounds present. No organomegaly or mass.  EXTREMITIES: No cyanosis, clubbing or edema b/l.    NEUROLOGIC: Cranial nerves II through XII are intact. No focal Motor or sensory deficits b/l.   PSYCHIATRIC: The patient is alert and oriented x 3.  SKIN: No obvious rash, lesion, or ulcer.    LABORATORY PANEL:   CBC Recent Labs  Lab 12/02/18 0427  WBC 4.6  HGB 10.5*  HCT 32.7*  PLT 201   ------------------------------------------------------------------------------------------------------------------  Chemistries  Recent Labs  Lab 12/01/18 0732 12/02/18 0427  NA 142 141  K 3.1* 3.3*  CL 107 107  CO2 26 23  GLUCOSE 111* 243*  BUN 19 16  CREATININE  1.98* 1.52*  CALCIUM 8.7* 8.6*  AST 26  --   ALT 23  --   ALKPHOS 48  --   BILITOT 1.1  --     ------------------------------------------------------------------------------------------------------------------  Cardiac Enzymes No results for input(s): TROPONINI in the last 168 hours. ------------------------------------------------------------------------------------------------------------------  RADIOLOGY:  Dg Chest 2 View  Result Date: 12/01/2018 CLINICAL DATA:  Patient with productive cough and shortness of breath EXAM: CHEST - 2 VIEW COMPARISON:  Chest radiograph 11/23/2018 FINDINGS: Monitoring leads overlie the patient. Multi lead pacer apparatus overlies the right hemithorax, leads are stable in position. Stable cardiomegaly. Tortuosity of the thoracic aorta. Postsurgical changes right lower lung. Heterogeneous opacities mid lower lungs bilaterally. Thoracic spine degenerative changes. IMPRESSION: Heterogeneous opacities left mid and lower lung as well as right lung base may represent atelectasis, scarring or infection. Electronically Signed   By: Lovey Newcomer M.D.   On: 12/01/2018 08:55     ASSESSMENT AND PLAN:   83 year old female with past medical history of atrial fibrillation, diabetes, sick sinus syndrome status post pacemaker, obesity, GERD, history of CHF, anxiety, osteoarthritis, glaucoma who presented to the hospital due to shortness of breath cough and weakness.  1.  Acute respiratory failure with hypoxia-secondary to reactive airway disease and also underlying flu. - Continue IV steroids, but will taper them.  Continue Tamiflu, scheduled duonebs, scheduled duo nebs.  Patient is clinically improving.  2.  Flu-patient is positive for influenza A. -Continue Tamiflu.  Continue airborne precautions.  3.  Diabetes type 2 without complication-continue glipizide, Tradjenta, sliding scale insulin.  4.  History of atrial fibrillation-rate controlled. -Continue amiodarone, carvedilol. -Continue Coumadin, INR supratherapeutic.  5.  Hyperlipidemia-continue  atorvastatin.   6.  Essential hypertension-continue carvedilol, Norvasc.  7.  Glaucoma-continue Travatan eyedrops.  8.  GERD-continue Protonix.  Discharge home tomorrow if clinically improving.  All the records are reviewed and case discussed with Care Management/Social Worker. Management plans discussed with the patient, family and they are in agreement.  CODE STATUS: Full code  DVT Prophylaxis: Coumadin  TOTAL TIME TAKING CARE OF THIS PATIENT: 30 minutes.   POSSIBLE D/C IN 1-2 DAYS, DEPENDING ON CLINICAL CONDITION.   Henreitta Leber M.D on 12/02/2018 at 12:56 PM  Between 7am to 6pm - Pager - [phone]  After 6pm go to [email or web address] - Proofreader  [facility]  Office  [phone]  CC: Primary care physician; Dion Body, MD

## 2018-12-02 NOTE — Care Management Note (Signed)
Case Management Note  Patient Details  Name: [name] [name] MRN: [id number] Date of Birth: 1935-06-14  Subjective/Objective:   Patient admitted with shortness of breath and tested positive for the flu.  Patient is sitting up in bed looking well and reports that she feels much better.  Patient is from home and is independent in ADL's, she has a walker but only uses when her gout flares up.  Patient drives.  PCP verified as Doy Mince who she sees every 4 months, pharmacy is [facility] on [address].  Patient lives in senior apartments- all one level with grab bars installed in the restrooms.  Patient has a son that lives in [place] and a sister Joycelyn Schmid that lives about 9 miles away.  No discharge needs identified. Doran Clay RN BSN [phone]                    Action/Plan:   Expected Discharge Date:                  Expected Discharge Plan:  Home/Self Care  In-House Referral:     Discharge planning Services  CM Consult  Post Acute Care Choice:    Choice offered to:     DME Arranged:    DME Agency:     HH Arranged:    [place] Agency:     Status of Service:  Completed, signed off  If discussed at [place] of Stay Meetings, dates discussed:    Additional Comments:  Shelbie Hutching, RN 12/02/2018, 2:38 PM

## 2018-12-03 LAB — GLUCOSE, CAPILLARY
Glucose-Capillary: 259 mg/dL — ABNORMAL HIGH (ref 70–99)
Glucose-Capillary: 338 mg/dL — ABNORMAL HIGH (ref 70–99)

## 2018-12-03 LAB — BASIC METABOLIC PANEL
Anion gap: 8 (ref 5–15)
BUN: 27 mg/dL — ABNORMAL HIGH (ref 8–23)
CO2: 24 mmol/L (ref 22–32)
Calcium: 8.7 mg/dL — ABNORMAL LOW (ref 8.9–10.3)
Chloride: 106 mmol/L (ref 98–111)
Creatinine, Ser: 1.64 mg/dL — ABNORMAL HIGH (ref 0.44–1.00)
GFR calc Af Amer: 34 mL/min — ABNORMAL LOW (ref >60)
GFR calc non Af Amer: 29 mL/min — ABNORMAL LOW (ref >60)
Glucose, Bld: 272 mg/dL — ABNORMAL HIGH (ref 70–99)
Potassium: 3.9 mmol/L (ref 3.5–5.1)
Sodium: 138 mmol/L (ref 135–145)

## 2018-12-03 LAB — PROTIME-INR
INR: 6.08
Prothrombin Time: 53.1 seconds — ABNORMAL HIGH (ref 11.4–15.2)

## 2018-12-03 MED ORDER — PREDNISONE 10 MG PO TABS: ORAL_TABLET | ORAL | 0 refills | Status: DC

## 2018-12-03 MED ORDER — OSELTAMIVIR PHOSPHATE 30 MG PO CAPS: 30.0000 mg | ORAL_CAPSULE | Freq: Two times a day (BID) | ORAL | 0 refills | Status: AC

## 2018-12-03 MED ORDER — DOXYCYCLINE HYCLATE 100 MG PO TABS: 100.0000 mg | ORAL_TABLET | Freq: Two times a day (BID) | ORAL | 0 refills | Status: AC

## 2018-12-03 NOTE — Discharge Summary (Signed)
[facility] [place] NAME: [name] [name]    MR#:  [id number]  DATE OF BIRTH:  1935-02-28  DATE OF ADMISSION:  12/01/2018 ADMITTING PHYSICIAN: Dustin Flock, MD  DATE OF DISCHARGE: 12/03/2018  PRIMARY CARE PHYSICIAN: Dion Body, MD    ADMISSION DIAGNOSIS:  Pneumonitis [J18.9] Influenza A [J10.1] CKD (chronic kidney disease) stage 3, GFR 30-59 ml/min (HCC) [N18.3] Type 2 diabetes mellitus with stage 3 chronic kidney disease, without long-term current use of insulin (HCC) [E11.22, N18.3]  DISCHARGE DIAGNOSIS:  Active Problems:   SOB (shortness of breath)   SECONDARY DIAGNOSIS:   Past Medical History:  Diagnosis Date  . 1st degree AV block    a. PR 400 msec with Apacing  . Anemia   . Anxiety   . Arthritis    a. shoulders.  . Chest pain    a. 2004 Cath:  reportedly nl;  b. 09/2013 Neg MV.  . Chronic diastolic heart failure ([place])    a. 09/2013 Echo: EF 50-55%, mild LVH, mild to mod MR/TR.  Marland Kitchen Chronic kidney disease   . Congestive heart disease ([place])   . Esophageal reflux   . H/O hiatal hernia   . History of UTI   . Hyperlipidemia   . Morbid obesity ([place])   . OSA on CPAP   . PAF (paroxysmal atrial fibrillation) ([place])   . SSS (sick sinus syndrome) ([place])    a. 2008 s/p MDT PPM;  b. 11/2013 Gen change- MDT ADDRL1 Adapta DC PPM, ser # [id number] H.  . Symptomatic PVCs   . Syncope and collapse    a. Felt to be vasovagal.  . Thyroid disease    on no meds per pt  . Type II diabetes mellitus ([place])   . Unspecified essential hypertension   . Unspecified glaucoma(365.9)     HOSPITAL COURSE:   83 year old female with past medical history of atrial fibrillation, diabetes, sick sinus syndrome status post pacemaker, obesity, GERD, history of CHF, anxiety, osteoarthritis, glaucoma who presented to the hospital due to shortness of breath cough and weakness.  1.  Acute respiratory failure with hypoxia-secondary to reactive airway  disease and also underlying flu. -Patient was treated with IV steroids, given Tamiflu and also given scheduled duo nebs and Pulmicort nebs. Pt. Has improved with therapy and has less wheezing and bronchospasm.  She is now being discharged on oral prednisone taper and Tamiflu.  2.  Flu-patient is positive for influenza A. - pt. Will finish course of Tamiflu.  Clinically improved.   3.  Diabetes type 2 without complication-continue glipizide, Tradjenta, sliding scale insulin. - BS were a bit elevated due to IV steroids but have improved now.    4.  History of atrial fibrillation-rate controlled. -Continue amiodarone, carvedilol. Patient's INR is supratherapeutic and pt. Should hold her coumadin for another 3 days and have it rechecked at her PCP's office next week.   5.  Hyperlipidemia- pt. Will continue atorvastatin.   6.  Essential hypertension- pt. Will continue carvedilol, Norvasc.  7.  Glaucoma-pt will continue Travatan eyedrops.  8.  GERD- pt. Will continue Protonix.  Pt. Will be discharged home today.   DISCHARGE CONDITIONS:   Stable.    CONSULTS OBTAINED:    DRUG ALLERGIES:   Allergies  Allergen Reactions  . Metoprolol Other (See Comments)    Bad dreams Patient stated that she had suicidal thoughts while taking this medication. Other reaction(s): Other (See Comments) Other reaction(s): Other (See Comments) Bad dreams  Patient stated that she had suicidal thoughts while taking this medication. Bad dreams Patient stated that she had suicidal thoughts while taking this medication. Bad dreams Patient stated that she had suicidal thoughts while taking this medication. Other reaction(s): Other (See Comments) Bad dreams   . Diltiazem Other (See Comments)    Patient stated that she had bad dreams with this medication Other reaction(s): Other (See Comments) Patient stated that she had bad dreams with this medication Other reaction(s): Other (See  Comments) Patient stated that she had bad dreams with this medication Patient stated that she had bad dreams with this medication Patient stated that she had bad dreams with this medication  . Levofloxacin Nausea And Vomiting and Nausea Only    Other reaction(s): Nausea And Vomiting    DISCHARGE MEDICATIONS:   Allergies as of 12/03/2018      Reactions   Metoprolol Other (See Comments)   Bad dreams Patient stated that she had suicidal thoughts while taking this medication. Other reaction(s): Other (See Comments) Other reaction(s): Other (See Comments) Bad dreams Patient stated that she had suicidal thoughts while taking this medication. Bad dreams Patient stated that she had suicidal thoughts while taking this medication. Bad dreams Patient stated that she had suicidal thoughts while taking this medication. Other reaction(s): Other (See Comments) Bad dreams   Diltiazem Other (See Comments)   Patient stated that she had bad dreams with this medication Other reaction(s): Other (See Comments) Patient stated that she had bad dreams with this medication Other reaction(s): Other (See Comments) Patient stated that she had bad dreams with this medication Patient stated that she had bad dreams with this medication Patient stated that she had bad dreams with this medication   Levofloxacin Nausea And Vomiting, Nausea Only   Other reaction(s): Nausea And Vomiting      Medication List    STOP taking these medications   flecainide 100 MG tablet Commonly known as:  TAMBOCOR     TAKE these medications   acetaminophen 500 MG tablet Commonly known as:  TYLENOL Take 1,000 mg by mouth every 6 (six) hours as needed for moderate pain.   allopurinol 100 MG tablet Commonly known as:  ZYLOPRIM Take 200 mg by mouth daily.   amiodarone 200 MG tablet Commonly known as:  PACERONE Take 100 mg by mouth 2 (two) times daily.   amLODipine 10 MG tablet Commonly known as:  NORVASC TAKE 1 TABLET  (10 MG TOTAL) BY MOUTH DAILY.   atorvastatin 20 MG tablet Commonly known as:  LIPITOR TAKE 1 TABLET (20 MG TOTAL) BY MOUTH DAILY. What changed:    how much to take  how to take this  when to take this  additional instructions   Brinzolamide-Brimonidine 1-0.2 % Susp Commonly known as:  [place] 1 drop into both eyes 2 (two) times daily.   carvedilol 25 MG tablet Commonly known as:  COREG Take 25-50 mg by mouth See admin instructions. Take 1 tablet (25MG ) by mouth every morning and 2 tablets (50MG ) by mouth every night before bed   carvedilol 12.5 MG tablet Commonly known as:  COREG TAKE 1 TABLET BY MOUTH IN THE MORNING WITH BREAKFAST AND 2 TABLETS IN THE EVENING BEFORE BED   CENTRUM SILVER PO Take 1 tablet by mouth daily.   diclofenac sodium 1 % Gel Commonly known as:  VOLTAREN Apply 2 g topically 4 (four) times daily.   doxycycline 100 MG tablet Commonly known as:  VIBRA-TABS Take 1 tablet (100 mg  total) by mouth every 12 (twelve) hours for 5 days.   esomeprazole 40 MG capsule Commonly known as:  NEXIUM TAKE 1 CAPSULE (40 MG TOTAL) BY MOUTH DAILY.   ferrous sulfate 325 (65 FE) MG EC tablet Take 325 mg by mouth 2 (two) times daily with a meal.   FLEET LIQUID GLYCERIN SUPP RE Place 1 Dose rectally daily as needed (constipation).   furosemide 20 MG tablet Commonly known as:  LASIX Take 1 tablet (20 mg total) by mouth daily. What changed:  how much to take   glipiZIDE 10 MG tablet Commonly known as:  GLUCOTROL TAKE 1 TABLET (10 MG TOTAL) BY MOUTH 2 (TWO) TIMES DAILY BEFORE A MEAL.   KLOR-CON M10 10 MEQ tablet Generic drug:  potassium chloride Take 1 tablet by mouth daily.   metFORMIN 500 MG 24 hr tablet Commonly known as:  GLUCOPHAGE-XR TAKE 1 TABLET (500 MG TOTAL) BY MOUTH 2 (TWO) TIMES DAILY.   mometasone 0.1 % cream Commonly known as:  ELOCON Apply topically daily. Apply to affected area as directed two times a day What changed:  additional  instructions   olmesartan 40 MG tablet Commonly known as:  BENICAR TAKE 1 TABLET (40 MG TOTAL) BY MOUTH DAILY.   oseltamivir 30 MG capsule Commonly known as:  TAMIFLU Take 1 capsule (30 mg total) by mouth 2 (two) times daily for 3 days.   polyethylene glycol packet Commonly known as:  MIRALAX / GLYCOLAX Take 17 g by mouth daily as needed for moderate constipation.   predniSONE 10 MG tablet Commonly known as:  DELTASONE Label  & dispense according to the schedule below. 5 Pills PO for 1 day then, 4 Pills PO for 1 day, 3 Pills PO for 1 day, 2 Pills PO for 1 day, 1 Pill PO for 1 days then STOP.   TRADJENTA 5 MG Tabs tablet Generic drug:  linagliptin TAKE 1 TABLET (5 MG TOTAL) BY MOUTH DAILY. What changed:  See the new instructions.   TRAVATAN 0.004 % ophthalmic solution Generic drug:  travoprost (benzalkonium) Place 1 drop into both eyes at bedtime.   warfarin 1 MG tablet Commonly known as:  COUMADIN Take as directed. If you are unsure how to take this medication, talk to your nurse or doctor. Original instructions:  Take 3-4 mg by mouth daily. Take 3MG  by mouth every Monday, Tuesday, Wednesday, Thursday and Friday evening and take 4MG  by mouth every Saturday and Sunday evening   warfarin 3 MG tablet Commonly known as:  COUMADIN Take as directed. If you are unsure how to take this medication, talk to your nurse or doctor. Original instructions:  TAKE 1-1.5 TABLETS DAILY AT 6 PM. TAKE 1 TAB ON TUES, THURS, SAT, SUN. TAKE 4.5 MG ON MON, WED, FRI         DISCHARGE INSTRUCTIONS:   DIET:  Cardiac diet and Diabetic diet  DISCHARGE CONDITION:  Stable  ACTIVITY:  Activity as tolerated  OXYGEN:  Home Oxygen: No.   Oxygen Delivery: room air  DISCHARGE LOCATION:  home   If you experience worsening of your admission symptoms, develop shortness of breath, life threatening emergency, suicidal or homicidal thoughts you must seek medical attention immediately by calling 911  or calling your MD immediately  if symptoms less severe.  You Must read complete instructions/literature along with all the possible adverse reactions/side effects for all the Medicines you take and that have been prescribed to you. Take any new Medicines after you have completely understood  and accpet all the possible adverse reactions/side effects.   Please note  You were cared for by a hospitalist during your hospital stay. If you have any questions about your discharge medications or the care you received while you were in the hospital after you are discharged, you can call the unit and asked to speak with the hospitalist on call if the hospitalist that took care of you is not available. Once you are discharged, your primary care physician will handle any further medical issues. Please note that NO REFILLS for any discharge medications will be authorized once you are discharged, as it is imperative that you return to your primary care physician (or establish a relationship with a primary care physician if you do not have one) for your aftercare needs so that they can reassess your need for medications and monitor your lab values.     Today   Feels better, shortness of breath, wheezing improved.  Pt. Would like to go home today.   VITAL SIGNS:  Blood pressure (!) 148/78, pulse 65, temperature 98.2 F (36.8 C), temperature source Oral, resp. rate 18, height 5\' 4"  (1.626 m), weight 109 kg, SpO2 97 %.  I/O:  No intake or output data in the 24 hours ending 12/03/18 1359  PHYSICAL EXAMINATION:   GENERAL:  83 y.o.-year-old obese patient lying in bed in no acute distress.  EYES: Pupils equal, round, reactive to light and accommodation. No scleral icterus. Extraocular muscles intact.  HEENT: Head atraumatic, normocephalic. Oropharynx and nasopharynx clear.  NECK:  Supple, no jugular venous distention. No thyroid enlargement, no tenderness.  LUNGS: Good air entry bilaterally, no  wheezing/rhonchi, no rales.  Negative use of accessory muscles. CARDIOVASCULAR: S1, S2 normal. No murmurs, rubs, or gallops.  ABDOMEN: Soft, nontender, nondistended. Bowel sounds present. No organomegaly or mass.  EXTREMITIES: No cyanosis, clubbing or edema b/l.    NEUROLOGIC: Cranial nerves II through XII are intact. No focal Motor or sensory deficits b/l.   PSYCHIATRIC: The patient is alert and oriented x 3.  SKIN: No obvious rash, lesion, or ulcer.   DATA REVIEW:   CBC Recent Labs  Lab 12/02/18 0427  WBC 4.6  HGB 10.5*  HCT 32.7*  PLT 201    Chemistries  Recent Labs  Lab 12/01/18 0732  12/03/18 0456  NA 142   < > 138  K 3.1*   < > 3.9  CL 107   < > 106  CO2 26   < > 24  GLUCOSE 111*   < > 272*  BUN 19   < > 27*  CREATININE 1.98*   < > 1.64*  CALCIUM 8.7*   < > 8.7*  AST 26  --   --   ALT 23  --   --   ALKPHOS 48  --   --   BILITOT 1.1  --   --    < > = values in this interval not displayed.    Cardiac Enzymes No results for input(s): TROPONINI in the last 168 hours.  Microbiology Results  Results for orders placed or performed during the hospital encounter of 12/01/18  Culture, sputum-assessment     Status: None   Collection Time: 12/01/18  2:37 PM  Result Value Ref Range Status   Specimen Description SPUTUM  Final   Special Requests NONE  Final   Sputum evaluation   Final    Sputum specimen not acceptable for testing.  Please recollect.   C/TANILLY GIRALT AT 6962  12/01/18.PMF Performed at [facility], [address], [place], [place] [address]    Report Status 12/01/2018 FINAL  Final    RADIOLOGY:  No results found.    Management plans discussed with the patient, family and they are in agreement.  CODE STATUS:     Code Status Orders  (From admission, onward)         Start     Ordered   12/01/18 1306  Full code  Continuous     12/01/18 1305       TOTAL TIME TAKING CARE OF THIS PATIENT: 40 minutes.    Henreitta Leber M.D on  12/03/2018 at 1:59 PM  Between 7am to 6pm - Pager - [phone]  After 6pm go to [email or web address] - Proofreader  [facility]  Office  [phone]  CC: Primary care physician; Dion Body, MD

## 2018-12-03 NOTE — Consult Note (Signed)
ANTICOAGULATION CONSULT NOTE - Initial Consult  Pharmacy Consult for Warfarin Dosing and Monitoring  Indication: atrial fibrillation  Patient Measurements: Height: 5\' 4"  (162.6 cm) Weight: 240 lb 4.8 oz (109 kg) IBW/kg (Calculated) : 54.7  Vital Signs: Temp: 98.2 F (36.8 C) (12/31 0638) Temp Source: Oral (12/31 9326) BP: 148/78 (12/31 7124) Pulse Rate: 65 (12/31 0638)  Labs: Recent Labs    12/01/18 0732 12/01/18 1356 12/02/18 0427 12/02/18 0736 12/03/18 0456  HGB 10.3*  --  10.5*  --   --   HCT 31.6*  --  32.7*  --   --   PLT 188  --  201  --   --   LABPROT  --  46.1*  --  50.9* 53.1*  INR  --  5.07*  --  5.76* 6.08*  CREATININE 1.98*  --  1.52*  --  1.64*    Estimated Creatinine Clearance: 32.4 mL/min (A) (by C-G formula based on SCr of 1.64 mg/dL (H)).   Medical History: Past Medical History:  Diagnosis Date  . 1st degree AV block    a. PR 400 msec with Apacing  . Anemia   . Anxiety   . Arthritis    a. shoulders.  . Chest pain    a. 2004 Cath:  reportedly nl;  b. 09/2013 Neg MV.  . Chronic diastolic heart failure ([place])    a. 09/2013 Echo: EF 50-55%, mild LVH, mild to mod MR/TR.  Marland Kitchen Chronic kidney disease   . Congestive heart disease ([place])   . Esophageal reflux   . H/O hiatal hernia   . History of UTI   . Hyperlipidemia   . Morbid obesity ([place])   . OSA on CPAP   . PAF (paroxysmal atrial fibrillation) ([place])   . SSS (sick sinus syndrome) ([employer])    a. 2008 s/p MDT PPM;  b. 11/2013 Gen change- MDT ADDRL1 Adapta DC PPM, ser # [id number] H.  . Symptomatic PVCs   . Syncope and collapse    a. Felt to be vasovagal.  . Thyroid disease    on no meds per pt  . Type II diabetes mellitus ([place])   . Unspecified essential hypertension   . Unspecified glaucoma(365.9)     Assessment: Pharmacy consulted for warfarin dosing and monitoring in 83 yo female w/PMH of A. Fib. Patient admitted for acute bronchospasm and influenza A. Doxycycline and oseltamivir ([place])  ordered on admission. Last warfarin dose reported to be 12/28. INR supratherapeutic on admission @ 5.07  Home Regimen: Warfarin 3mg  T,TH, Sat, Sun                              Warfarin 4.5mg  M,W,F  DATE INR DOSE 12/29 5.07 Held 12/30 5.76 Held 12/31 6.08 Held   Goal of Therapy:  INR 2-3 Monitor platelets by anticoagulation protocol: Yes   Plan:  12/30 INR 6.08. level is supratherapeutic.  Oseltamivir may also further elevate INR and increase risk of bleeding.   No signs or symptoms of active bleed per RN.  Will hold warfarin doses until INR closer to therapeutic range.   INRs daily while on antibiotics per protocol.  Forrest Moron, PharmD Clinical Pharmacist 12/03/2018 1:48 PM

## 2018-12-03 NOTE — Progress Notes (Signed)
Nutrition Brief Note  Patient identified on the Malnutrition Screening Tool (MST) Report  Wt Readings from Last 15 Encounters:  12/01/18 109 kg  11/23/18 109.3 kg  08/30/18 111.1 kg  06/25/18 109.8 kg  02/28/18 111 kg  12/14/17 108 kg  12/10/17 108.6 kg  08/30/17 108.4 kg  06/11/17 109.5 kg  05/24/17 109.4 kg  05/08/17 111 kg  04/26/17 112.5 kg  04/19/17 112.5 kg  04/03/17 113.4 kg  04/02/17 114.2 kg   Met with patient at bedside. She reports her appetite was decreased 1-2 days PTA but has improved and is now back to baseline. She is eating 90-100% of her meals here. Weight is stable. No subcutaneous fat or muscle wasting found on nutrition focused physical exam. Patient does not meet criteria for malnutrition. Plan is for patient to discharge home today.  Body mass index is 41.25 kg/m. Patient meets criteria for obesity class III based on current BMI.   Current diet order is heart-health/carbohdyrate modified, patient is consuming approximately 90-100% of meals at this time. Labs and medications reviewed.   No nutrition interventions warranted at this time. If nutrition issues arise, please consult RD.   Willey Blade, MS, [place], LDN Office: [phone] Pager: [phone] After Hours/Weekend Pager: [phone]

## 2018-12-03 NOTE — Discharge Instructions (Signed)
Start taking coumadin on Sat.

## 2018-12-03 NOTE — Progress Notes (Signed)
Inpatient Diabetes Program Recommendations  AACE/ADA: New Consensus Statement on Inpatient Glycemic Control (2019)  Target Ranges:  Prepandial:   less than 140 mg/dL      Peak postprandial:   less than 180 mg/dL (1-2 hours)      Critically ill patients:  140 - 180 mg/dL  Results for [name] [name], [name] [name] (MRN [id number]) as of 12/03/2018 07:43  Ref. Range 12/03/2018 04:56  Glucose Latest Ref Range: 70 - 99 mg/dL 272 (H)   Results for [name] [name], [name] [name] (MRN [id number]) as of 12/03/2018 07:43  Ref. Range 12/02/2018 07:52 12/02/2018 12:10 12/02/2018 16:56 12/02/2018 21:20  Glucose-Capillary Latest Ref Range: 70 - 99 mg/dL 219 (H) 253 (H) 307 (H) 275 (H)    Review of Glycemic Control  Diabetes history: DM2 Outpatient Diabetes medications: Metformin XR 500 BID, Glipizide 10 mg BID, Tradjenta 5 mg daily Current orders for Inpatient glycemic control: Novolog 0-9 units TID with meals, Tradjenta 5 mg daily, Glipizide 10 mg BID; Solumedrol 60 mg Q12H  Inpatient Diabetes Program Recommendations:  Correction (SSI): Please consider ordering Novolog 0-5 units QHS for bedtime correction. Insulin - Meal Coverage: If steroids are continued, please consider ordering Novolog 4 units TID with meals for meal coverage if patient eats at least 50% of meals.  Thanks, Barnie Alderman, RN, MSN, CDE Diabetes Coordinator Inpatient Diabetes Program [phone] (Team Pager from 8am to 5pm)

## 2018-12-03 NOTE — Progress Notes (Signed)
Pt for discharge home. A/o/ no resp distress. Instructions discussed with pt.presc given and they and home meds discussed. Diet / activity and f/u discussed  Verbalizes understanding. Iv site d/cd. Ready for discharge . Waiting on ride.

## 2018-12-05 ENCOUNTER — Ambulatory Visit: Payer: Medicare HMO

## 2018-12-06 ENCOUNTER — Telehealth: Payer: Self-pay

## 2018-12-06 NOTE — Telephone Encounter (Signed)
Flagged on EMMI report for not knowing who to call regarding changes in condition and for having unfilled prescriptions.  Called and spoke with patient.  She reports she was able to fill all her prescriptions and knows to call Dr. Raylene Miyamoto office for any questions or concerns.  She attended a follow up appointment with him yesterday. No other questions or issues at this time.  I thanked her for her time and informed her she would receive one more automated call checking in over the next few days.

## 2018-12-18 ENCOUNTER — Ambulatory Visit
Admission: RE | Admit: 2018-12-18 | Discharge: 2018-12-18 | Disposition: A | Discharge status: Home / Self Care | Payer: Medicare HMO | Source: Ambulatory Visit | Attending: Cardiology | Admitting: Cardiology

## 2018-12-18 DIAGNOSIS — I440 Atrioventricular block, first degree: Secondary | ICD-10-CM | POA: Insufficient documentation

## 2018-12-18 DIAGNOSIS — I130 Hypertensive heart and chronic kidney disease with heart failure and stage 1 through stage 4 chronic kidney disease, or unspecified chronic kidney disease: Secondary | ICD-10-CM | POA: Diagnosis not present

## 2018-12-18 DIAGNOSIS — E785 Hyperlipidemia, unspecified: Secondary | ICD-10-CM | POA: Insufficient documentation

## 2018-12-18 DIAGNOSIS — F419 Anxiety disorder, unspecified: Secondary | ICD-10-CM | POA: Insufficient documentation

## 2018-12-18 DIAGNOSIS — I480 Paroxysmal atrial fibrillation: Secondary | ICD-10-CM | POA: Insufficient documentation

## 2018-12-18 DIAGNOSIS — G4733 Obstructive sleep apnea (adult) (pediatric): Secondary | ICD-10-CM | POA: Insufficient documentation

## 2018-12-18 DIAGNOSIS — I5032 Chronic diastolic (congestive) heart failure: Secondary | ICD-10-CM | POA: Insufficient documentation

## 2018-12-18 DIAGNOSIS — R0602 Shortness of breath: Secondary | ICD-10-CM

## 2018-12-18 DIAGNOSIS — D649 Anemia, unspecified: Secondary | ICD-10-CM | POA: Diagnosis not present

## 2018-12-18 DIAGNOSIS — I081 Rheumatic disorders of both mitral and tricuspid valves: Secondary | ICD-10-CM | POA: Diagnosis not present

## 2018-12-18 DIAGNOSIS — R55 Syncope and collapse: Secondary | ICD-10-CM | POA: Insufficient documentation

## 2018-12-18 DIAGNOSIS — E1122 Type 2 diabetes mellitus with diabetic chronic kidney disease: Secondary | ICD-10-CM | POA: Insufficient documentation

## 2018-12-18 DIAGNOSIS — N189 Chronic kidney disease, unspecified: Secondary | ICD-10-CM | POA: Diagnosis not present

## 2018-12-18 NOTE — Progress Notes (Signed)
*  PRELIMINARY RESULTS* Echocardiogram 2D Echocardiogram has been performed.  Sherrie Sport 12/18/2018, 10:28 AM

## 2018-12-30 ENCOUNTER — Inpatient Hospital Stay: Payer: Medicare HMO | Attending: Oncology

## 2018-12-30 DIAGNOSIS — I130 Hypertensive heart and chronic kidney disease with heart failure and stage 1 through stage 4 chronic kidney disease, or unspecified chronic kidney disease: Secondary | ICD-10-CM | POA: Insufficient documentation

## 2018-12-30 DIAGNOSIS — Z87891 Personal history of nicotine dependence: Secondary | ICD-10-CM | POA: Insufficient documentation

## 2018-12-30 DIAGNOSIS — D509 Iron deficiency anemia, unspecified: Secondary | ICD-10-CM | POA: Diagnosis not present

## 2018-12-30 DIAGNOSIS — D582 Other hemoglobinopathies: Secondary | ICD-10-CM | POA: Insufficient documentation

## 2018-12-30 DIAGNOSIS — Z79899 Other long term (current) drug therapy: Secondary | ICD-10-CM | POA: Insufficient documentation

## 2018-12-30 DIAGNOSIS — N183 Chronic kidney disease, stage 3 (moderate): Secondary | ICD-10-CM | POA: Diagnosis not present

## 2018-12-30 DIAGNOSIS — Z803 Family history of malignant neoplasm of breast: Secondary | ICD-10-CM | POA: Insufficient documentation

## 2018-12-30 DIAGNOSIS — E1122 Type 2 diabetes mellitus with diabetic chronic kidney disease: Secondary | ICD-10-CM | POA: Insufficient documentation

## 2018-12-30 DIAGNOSIS — Z800 Family history of malignant neoplasm of digestive organs: Secondary | ICD-10-CM | POA: Diagnosis not present

## 2018-12-30 LAB — CBC
HCT: 29.3 % — ABNORMAL LOW (ref 36.0–46.0)
Hemoglobin: 9.6 g/dL — ABNORMAL LOW (ref 12.0–15.0)
MCH: 25.5 pg — ABNORMAL LOW (ref 26.0–34.0)
MCHC: 32.8 g/dL (ref 30.0–36.0)
MCV: 77.7 fL — ABNORMAL LOW (ref 80.0–100.0)
Platelets: 188 10*3/uL (ref 150–400)
RBC: 3.77 MIL/uL — ABNORMAL LOW (ref 3.87–5.11)
RDW: 18.4 % — ABNORMAL HIGH (ref 11.5–15.5)
WBC: 4.7 10*3/uL (ref 4.0–10.5)
nRBC: 0 % (ref 0.0–0.2)

## 2018-12-30 LAB — FERRITIN: Ferritin: 127 ng/mL (ref 11–307)

## 2018-12-30 LAB — IRON AND TIBC
Iron: 42 ug/dL (ref 28–170)
Saturation Ratios: 16 % (ref 10.4–31.8)
TIBC: 261 ug/dL (ref 250–450)
UIBC: 219 ug/dL

## 2018-12-31 ENCOUNTER — Other Ambulatory Visit: Payer: Self-pay | Admitting: Family Medicine

## 2018-12-31 DIAGNOSIS — R1310 Dysphagia, unspecified: Secondary | ICD-10-CM

## 2019-01-06 ENCOUNTER — Other Ambulatory Visit: Payer: Self-pay | Admitting: Family Medicine

## 2019-01-06 ENCOUNTER — Ambulatory Visit
Admission: RE | Admit: 2019-01-06 | Discharge: 2019-01-06 | Disposition: A | Discharge status: Home / Self Care | Payer: Medicare HMO | Source: Ambulatory Visit | Attending: Family Medicine | Admitting: Family Medicine

## 2019-01-06 DIAGNOSIS — R1310 Dysphagia, unspecified: Secondary | ICD-10-CM | POA: Diagnosis not present

## 2019-01-19 ENCOUNTER — Emergency Department
Admission: EM | Admit: 2019-01-19 | Discharge: 2019-01-19 | Disposition: A | Discharge status: Home / Self Care | Payer: Medicare HMO | Attending: Emergency Medicine | Admitting: Emergency Medicine

## 2019-01-19 ENCOUNTER — Other Ambulatory Visit: Payer: Self-pay

## 2019-01-19 ENCOUNTER — Encounter: Payer: Self-pay | Admitting: Emergency Medicine

## 2019-01-19 ENCOUNTER — Emergency Department: Payer: Medicare HMO

## 2019-01-19 DIAGNOSIS — N183 Chronic kidney disease, stage 3 (moderate): Secondary | ICD-10-CM | POA: Insufficient documentation

## 2019-01-19 DIAGNOSIS — R51 Headache: Secondary | ICD-10-CM | POA: Diagnosis not present

## 2019-01-19 DIAGNOSIS — Z79899 Other long term (current) drug therapy: Secondary | ICD-10-CM | POA: Diagnosis not present

## 2019-01-19 DIAGNOSIS — Z7901 Long term (current) use of anticoagulants: Secondary | ICD-10-CM | POA: Diagnosis not present

## 2019-01-19 DIAGNOSIS — Z950 Presence of cardiac pacemaker: Secondary | ICD-10-CM | POA: Diagnosis not present

## 2019-01-19 DIAGNOSIS — R0789 Other chest pain: Secondary | ICD-10-CM | POA: Insufficient documentation

## 2019-01-19 DIAGNOSIS — I5032 Chronic diastolic (congestive) heart failure: Secondary | ICD-10-CM | POA: Insufficient documentation

## 2019-01-19 DIAGNOSIS — I130 Hypertensive heart and chronic kidney disease with heart failure and stage 1 through stage 4 chronic kidney disease, or unspecified chronic kidney disease: Secondary | ICD-10-CM | POA: Insufficient documentation

## 2019-01-19 DIAGNOSIS — Z87891 Personal history of nicotine dependence: Secondary | ICD-10-CM | POA: Diagnosis not present

## 2019-01-19 DIAGNOSIS — Z7984 Long term (current) use of oral hypoglycemic drugs: Secondary | ICD-10-CM | POA: Diagnosis not present

## 2019-01-19 DIAGNOSIS — E1122 Type 2 diabetes mellitus with diabetic chronic kidney disease: Secondary | ICD-10-CM | POA: Insufficient documentation

## 2019-01-19 DIAGNOSIS — R519 Headache, unspecified: Secondary | ICD-10-CM

## 2019-01-19 DIAGNOSIS — R079 Chest pain, unspecified: Secondary | ICD-10-CM

## 2019-01-19 DIAGNOSIS — I10 Essential (primary) hypertension: Secondary | ICD-10-CM

## 2019-01-19 LAB — CBC WITH DIFFERENTIAL/PLATELET
Abs Immature Granulocytes: 0.04 10*3/uL (ref 0.00–0.07)
Basophils Absolute: 0 10*3/uL (ref 0.0–0.1)
Basophils Relative: 0 %
Eosinophils Absolute: 0.2 10*3/uL (ref 0.0–0.5)
Eosinophils Relative: 2 %
HCT: 32 % — ABNORMAL LOW (ref 36.0–46.0)
Hemoglobin: 10.5 g/dL — ABNORMAL LOW (ref 12.0–15.0)
Immature Granulocytes: 1 %
Lymphocytes Relative: 20 %
Lymphs Abs: 1.4 10*3/uL (ref 0.7–4.0)
MCH: 25.2 pg — ABNORMAL LOW (ref 26.0–34.0)
MCHC: 32.8 g/dL (ref 30.0–36.0)
MCV: 76.7 fL — ABNORMAL LOW (ref 80.0–100.0)
Monocytes Absolute: 0.6 10*3/uL (ref 0.1–1.0)
Monocytes Relative: 9 %
Neutro Abs: 4.7 10*3/uL (ref 1.7–7.7)
Neutrophils Relative %: 68 %
Platelets: 162 10*3/uL (ref 150–400)
RBC: 4.17 MIL/uL (ref 3.87–5.11)
RDW: 17 % — ABNORMAL HIGH (ref 11.5–15.5)
WBC: 6.9 10*3/uL (ref 4.0–10.5)
nRBC: 0 % (ref 0.0–0.2)

## 2019-01-19 LAB — COMPREHENSIVE METABOLIC PANEL
ALT: 17 U/L (ref 0–44)
AST: 19 U/L (ref 15–41)
Albumin: 3.8 g/dL (ref 3.5–5.0)
Alkaline Phosphatase: 56 U/L (ref 38–126)
Anion gap: 8 (ref 5–15)
BUN: 22 mg/dL (ref 8–23)
CO2: 26 mmol/L (ref 22–32)
Calcium: 9.5 mg/dL (ref 8.9–10.3)
Chloride: 107 mmol/L (ref 98–111)
Creatinine, Ser: 1.93 mg/dL — ABNORMAL HIGH (ref 0.44–1.00)
GFR calc Af Amer: 28 mL/min — ABNORMAL LOW (ref >60)
GFR calc non Af Amer: 24 mL/min — ABNORMAL LOW (ref >60)
Glucose, Bld: 82 mg/dL (ref 70–99)
Potassium: 3.3 mmol/L — ABNORMAL LOW (ref 3.5–5.1)
Sodium: 141 mmol/L (ref 135–145)
Total Bilirubin: 1 mg/dL (ref 0.3–1.2)
Total Protein: 6.9 g/dL (ref 6.5–8.1)

## 2019-01-19 LAB — TROPONIN I
Troponin I: <0.03 ng/mL (ref <0.03)
Troponin I: <0.03 ng/mL (ref <0.03)

## 2019-01-19 MED ORDER — ACETAMINOPHEN 500 MG PO TABS
1000.0000 mg | ORAL_TABLET | Freq: Once | ORAL | Status: AC
  Administered 2019-01-19: 1000 mg via ORAL
  Filled 2019-01-19: qty 2

## 2019-01-19 MED ORDER — AMLODIPINE BESYLATE 5 MG PO TABS
10.0000 mg | ORAL_TABLET | Freq: Once | ORAL | Status: AC
  Administered 2019-01-19: 10 mg via ORAL
  Filled 2019-01-19: qty 2

## 2019-01-19 MED ORDER — CARVEDILOL 25 MG PO TABS
25.0000 mg | ORAL_TABLET | Freq: Two times a day (BID) | ORAL | Status: DC
  Administered 2019-01-19: 25 mg via ORAL
  Filled 2019-01-19: qty 1

## 2019-01-19 MED ORDER — IRBESARTAN 150 MG PO TABS
300.0000 mg | ORAL_TABLET | Freq: Every day | ORAL | Status: DC
  Administered 2019-01-19: 300 mg via ORAL
  Filled 2019-01-19: qty 2

## 2019-01-19 NOTE — ED Notes (Signed)
Pt given crackers, applesauce, peanut butter, a cup of water and milk. Pt states that she hasn't eaten anything this am and can't take her medications on an empty stomach

## 2019-01-19 NOTE — ED Provider Notes (Signed)
[facility] Emergency Department Provider Note  ____________________________________________  Time seen: Approximately 10:52 AM  I have reviewed the triage vital signs and the nursing notes.   HISTORY  Chief Complaint Hypertension   HPI [name] [name] is a 84 y.o. female with a history of hypertension, anemia, hyperlipidemia, OSA on CPAP, atrial fibrillation, sick sinus syndrome status post pacemaker, diabetes who presents for evaluation of hypertension.  Patient reports that 4 months ago her cardiologist took her off of her amlodipine.  About 2 weeks ago she started hearing a whooshing sound into her right ear which is usually present when her blood pressure is high.  She checked it at home and a blood pressure was elevated in the 190s.  She saw her primary care doctor who started her back on the amlodipine 3 days ago.  She is currently on Benicar and amlodipine and Coreg for her blood pressure.  Has not taken any of these medications this morning.  She has been checking her blood pressure at home and she was told by her doctor to present to the emergency room if her blood pressure remained elevated over the weekend.  This morning her blood pressure was elevated in the 190s which prompted her visit to the emergency room.  She is also complaining of mild frontal throbbing to remittent headache over the last few days.  No slurred speech, facial droop, thunderclap headache, changes in vision, difficulty walking, unilateral weakness or numbness.  Upon arrival to the emergency room patient reports that she felt a mild pressure in the center of her chest which lasted 5 minutes and resolved without intervention.  She denies any prior history of heart attacks.  She denies shortness of breath dizziness or nausea associated with this episode.  Past Medical History:  Diagnosis Date  . 1st degree AV block    a. PR 400 msec with Apacing  . Anemia   . Anxiety   . Arthritis    a.  shoulders.  . Chest pain    a. 2004 Cath:  reportedly nl;  b. 09/2013 Neg MV.  . Chronic diastolic heart failure ([place])    a. 09/2013 Echo: EF 50-55%, mild LVH, mild to mod MR/TR.  Marland Kitchen Chronic kidney disease   . Congestive heart disease ([place])   . Esophageal reflux   . H/O hiatal hernia   . History of UTI   . Hyperlipidemia   . Morbid obesity ([place])   . OSA on CPAP   . PAF (paroxysmal atrial fibrillation) ([place])   . SSS (sick sinus syndrome) ([place])    a. 2008 s/p MDT PPM;  b. 11/2013 Gen change- MDT ADDRL1 [place] DC PPM, ser # [id number].  . Symptomatic PVCs   . Syncope and collapse    a. Felt to be vasovagal.  . Thyroid disease    on no meds per pt  . Type II diabetes mellitus ([place])   . Unspecified essential hypertension   . Unspecified glaucoma(365.9)     Patient Active Problem List   Diagnosis Date Noted  . SOB (shortness of breath) 12/01/2018  . Eczema 04/03/2017  . Glaucoma (increased eye pressure) 04/03/2017  . Hiatal hernia 04/03/2017  . Iron deficiency anemia 04/03/2017  . Osteoarthritis 04/03/2017  . Pure hypercholesterolemia 04/03/2017  . Tinnitus 04/03/2017  . Valvular heart disease 04/03/2017  . Venous stasis 04/03/2017  . Bilateral hearing loss 03/07/2017  . Tinnitus, bilateral 03/07/2017  . Pounding noise in right ear 02/13/2017  . Eczematous  skin lesions 02/02/2017  . URI with cough and congestion 12/13/2016  . Colicky RLQ abdominal pain 11/01/2016  . 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 Anxiety 07/28/2016  . Acute maxillary sinusitis 03/23/2016  . Pain of right hip joint 03/23/2016  . CKD (chronic kidney disease) stage 3, GFR 30-59 ml/min (HCC) 02/03/2016  . Chronic gout of right foot 12/16/2015  . Productive cough 10/26/2015  . Infection of skin of left ear lobe 10/11/2015  . Acute non-recurrent maxillary sinusitis 09/17/2015  . Allergic rhinitis 09/14/2015  . Hyperlipidemia 09/14/2015  . Gout attack 07/21/2015  . Anticoagulation monitoring, INR  range 2-3 05/18/2015  . RLS (restless legs syndrome) 04/14/2015  . History of colon polyps 12/23/2014  . Crohn's disease in remission ([place]) 09/15/2014  . 1st degree AV block   . Chronic diastolic heart failure ([place])   . Cardiac arrhythmia 12/01/2013  . Hemoptysis 11/24/2013  . Acute on chronic diastolic CHF (congestive heart failure) ([place]) 10/17/2013  . Episodic lightheadedness 07/16/2013  . Palpitations 07/16/2013  . Syncope 08/15/2012  . Back pain 05/30/2012  . OSA (obstructive sleep apnea) 01/08/2012  . DYSPNEA 01/28/2010  . PVC (premature ventricular contraction) 09/13/2009  . Flutter-fibrillation ([place]) 09/13/2009  . EDEMA 08/23/2009  . Diabetes mellitus type 2, controlled, without complications ([place]) 25/42/7062  . GLAUCOMA 04/13/2009  . Essential hypertension 04/13/2009  . PAF (paroxysmal atrial fibrillation) ([place]) 04/13/2009  . SICK SINUS/ TACHY-BRADY SYNDROME 04/13/2009  . GERD 04/13/2009  . Cardiac pacemaker - Medtronic 04/13/2009    Past Surgical History:  Procedure Laterality Date  . APPENDECTOMY    . CARDIOVERSION N/A 04/26/2017   Procedure: Cardioversion;  Surgeon: Minna Merritts, MD;  Location: [facility] ORS;  Service: Cardiovascular;  Laterality: N/A;  . DILATION AND CURETTAGE OF UTERUS    . INCISION AND DRAINAGE OF WOUND Right 03/1998   "leg; it was like a boil" (12/01/2013)  . INSERT / REPLACE / REMOVE PACEMAKER  2008; 12/01/2013   MDT ADDRL1 pacemaker - gen change by Dr Caryl Comes 12/01/2013  . LUNG BIOPSY  2010   [facility]  . PERMANENT PACEMAKER GENERATOR CHANGE N/A 12/01/2013   Procedure: PERMANENT PACEMAKER GENERATOR CHANGE;  Surgeon: Deboraha Sprang, MD;  Location: [facility] C[facility] LAB;  Service: Cardiovascular;  Laterality: N/A;  . [place]    Prior to Admission medications   Medication Sig Start Date End Date Taking? Authorizing Provider  acetaminophen (TYLENOL) 500 MG tablet Take 1,000 mg by mouth every 6 (six) hours as needed for moderate pain.      [provider]  allopurinol (ZYLOPRIM) 100 MG tablet Take 200 mg by mouth daily.    [provider]  amiodarone (PACERONE) 200 MG tablet Take 100 mg by mouth 2 (two) times daily.    [provider]  amLODipine (NORVASC) 10 MG tablet TAKE 1 TABLET (10 MG TOTAL) BY MOUTH DAILY. 12/20/16   Roselee Nova, MD  atorvastatin (LIPITOR) 20 MG tablet TAKE 1 TABLET (20 MG TOTAL) BY MOUTH DAILY. Patient taking differently: Take 20 mg by mouth at bedtime.  02/02/17   Roselee Nova, MD  Brinzolamide-Brimonidine [facility]) 1-0.2 % SUSP Place 1 drop into both eyes 2 (two) times daily. 07/05/15   Roselee Nova, MD  carvedilol (COREG) 12.5 MG tablet TAKE 1 TABLET BY MOUTH IN THE MORNING WITH BREAKFAST AND 2 TABLETS IN THE EVENING BEFORE BED 07/01/18   Minna Merritts, MD  carvedilol (COREG) 25 MG tablet Take 25-50  mg by mouth See admin instructions. Take 1 tablet (25MG ) by mouth every morning and 2 tablets (50MG ) by mouth every night before bed    [provider]  diclofenac sodium (VOLTAREN) 1 % GEL Apply 2 g topically 4 (four) times daily.    [provider]  esomeprazole (NEXIUM) 40 MG capsule TAKE 1 CAPSULE (40 MG TOTAL) BY MOUTH DAILY. 11/16/16   Roselee Nova, MD  ferrous sulfate 325 (65 FE) MG EC tablet Take 325 mg by mouth 2 (two) times daily with a meal.  03/01/17   [provider]  furosemide (LASIX) 20 MG tablet Take 1 tablet (20 mg total) by mouth daily. Patient taking differently: Take 60 mg by mouth daily.  02/02/17   Rochel Brome A, MD  glipiZIDE (GLUCOTROL) 10 MG tablet TAKE 1 TABLET (10 MG TOTAL) BY MOUTH 2 (TWO) TIMES DAILY BEFORE A MEAL. 01/29/17   Roselee Nova, MD  Glycerin, Laxative, (FLEET LIQUID GLYCERIN SUPP RE) Place 1 Dose rectally daily as needed (constipation).    [provider]  KLOR-CON M10 10 MEQ tablet Take 1 tablet by mouth daily. 06/12/18   [provider]  metFORMIN (GLUCOPHAGE-XR) 500 MG 24 hr  tablet TAKE 1 TABLET (500 MG TOTAL) BY MOUTH 2 (TWO) TIMES DAILY. 02/02/17   Roselee Nova, MD  mometasone (ELOCON) 0.1 % cream Apply topically daily. Apply to affected area as directed two times a day Patient taking differently: Apply topically daily. Apply to affected area as directed two times a day as needed 02/02/17   Roselee Nova, MD  Multiple Vitamins-Minerals (CENTRUM SILVER PO) Take 1 tablet by mouth daily.     [provider]  olmesartan (BENICAR) 40 MG tablet TAKE 1 TABLET (40 MG TOTAL) BY MOUTH DAILY. 12/19/16   Roselee Nova, MD  polyethylene glycol [facility] Floria Raveling) packet Take 17 g by mouth daily as needed for moderate constipation.    [provider]  predniSONE (DELTASONE) 10 MG tablet Label  & dispense according to the schedule below. 5 Pills PO for 1 day then, 4 Pills PO for 1 day, 3 Pills PO for 1 day, 2 Pills PO for 1 day, 1 Pill PO for 1 days then STOP. 12/03/18   Sainani, Belia Heman, MD  TRADJENTA 5 MG TABS tablet TAKE 1 TABLET (5 MG TOTAL) BY MOUTH DAILY. Patient taking differently: TAKE 1 TABLET (5 MG TOTAL) BY MOUTH DAILY WITH BREAKFAST 12/07/16   Rochel Brome A, MD  travoprost, benzalkonium, (TRAVATAN) 0.004 % ophthalmic solution Place 1 drop into both eyes at bedtime.     [provider]  warfarin (COUMADIN) 1 MG tablet Take 3-4 mg by mouth daily. Take 3MG  by mouth every Monday, Tuesday, Wednesday, Thursday and Friday evening and take 4MG  by mouth every Saturday and Sunday evening    [provider]  warfarin (COUMADIN) 3 MG tablet TAKE 1-1.5 TABLETS DAILY AT 6 PM. TAKE 1 TAB ON TUES, THURS, SAT, SUN. TAKE 4.5 MG ON MON, WED, FRI 10/25/15   Roselee Nova, MD    Allergies Metoprolol; Diltiazem; and Levofloxacin  Family History  Problem Relation Age of Onset  . Heart disease Father   . Rheum arthritis Father   . Allergies Father   . Liver cancer Mother   . Pancreatic cancer Mother   . Hypertension Sister   . Rheum  arthritis Sister   . Breast cancer Maternal Aunt  Social History Social History   Tobacco Use  . Smoking status: Former Smoker    Packs/day: 0.50    Years: 34.00    Pack years: 17.00    Types: Cigarettes    Last attempt to quit: 08/15/1989    Years since quitting: 29.4  . Smokeless tobacco: Never Used  Substance Use Topics  . Alcohol use: No    Alcohol/week: 0.0 standard drinks  . Drug use: No    Review of Systems  Constitutional: Negative for fever. Eyes: Negative for visual changes. ENT: Negative for sore throat. Neck: No neck pain  Cardiovascular: + chest pain. Respiratory: Negative for shortness of breath. Gastrointestinal: Negative for abdominal pain, vomiting or diarrhea. Genitourinary: Negative for dysuria. Musculoskeletal: Negative for back pain. Skin: Negative for rash. Neurological: Negative for weakness or numbness. + HA Psych: No SI or HI  ____________________________________________   PHYSICAL EXAM:  VITAL SIGNS: ED Triage Vitals [01/19/19 0516]  Enc Vitals Group     BP (!) 177/63     Pulse Rate 73     Resp 18     Temp 98.6 F (37 C)     Temp Source Oral     SpO2 97 %     Weight 237 lb (107.5 kg)     Height 5\' 4"  (1.626 m)     Head Circumference      Peak Flow      Pain Score 8     Pain Loc      Pain Edu?      Excl. in [place]?     Constitutional: Alert and oriented. Well appearing and in no apparent distress. HEENT:      Head: Normocephalic and atraumatic.         Eyes: Conjunctivae are normal. Sclera is non-icteric.       Mouth/Throat: Mucous membranes are moist.       Neck: Supple with no signs of meningismus. Cardiovascular: Regular rate and rhythm. No murmurs, gallops, or rubs. 2+ symmetrical distal pulses are present in all extremities. No JVD. Respiratory: Normal respiratory effort. Lungs are clear to auscultation bilaterally. No wheezes, crackles, or rhonchi.  Gastrointestinal: Soft, non tender, and non distended with positive  bowel sounds. No rebound or guarding. Musculoskeletal: Nontender with normal range of motion in all extremities. No edema, cyanosis, or erythema of extremities. Neurologic: Normal speech and language. Face is symmetric.  Intact strength and sensation x4 Skin: Skin is warm, dry and intact. No rash noted. Psychiatric: Mood and affect are normal. Speech and behavior are normal.  ____________________________________________   LABS (all labs ordered are listed, but only abnormal results are displayed)  Labs Reviewed  CBC WITH DIFFERENTIAL/PLATELET - Abnormal; Notable for the following components:      Result Value   Hemoglobin 10.5 (*)    HCT 32.0 (*)    MCV 76.7 (*)    MCH 25.2 (*)    RDW 17.0 (*)    All other components within normal limits  COMPREHENSIVE METABOLIC PANEL - Abnormal; Notable for the following components:   Potassium 3.3 (*)    Creatinine, Ser 1.93 (*)    GFR calc non Af Amer 24 (*)    GFR calc Af Amer 28 (*)    All other components within normal limits  TROPONIN I  TROPONIN I   ____________________________________________  EKG  ED ECG REPORT I, Rudene Re, the attending physician, personally viewed and interpreted this ECG.  Atrial paced rhythm with rate of 68, normal intervals,  normal axis, no ST elevations or depressions.  Unchanged from prior. ____________________________________________  RADIOLOGY  none  ____________________________________________   PROCEDURES  Procedure(s) performed: None Procedures Critical Care performed:  None ____________________________________________   INITIAL IMPRESSION / ASSESSMENT AND PLAN / ED COURSE  84 y.o. female with a history of hypertension, anemia, hyperlipidemia, OSA on CPAP, atrial fibrillation, sick sinus syndrome status post pacemaker, diabetes who presents for evaluation of hypertension in the setting of several recent changes in her medication.  Patient was given a dose of Benicar, increase her  amlodipine to 10 mg and a dose of her Coreg and with significant improvement in her blood pressure.  EKG with no ischemic changes.  Troponin x2 was negative.  Patient remains with no further episodes of chest pain.  Her headaches mild with no neurological deficits, no thunderclap headache.  At this time no indication for head CT.  Labs showing no endorgan damage.  Will discharge patient home on the current medications given in the emergency room and close follow-up with primary care doctor.  Recommended keeping a blood pressure diary.  Discussed return precautions with patient      As part of my medical decision making, I reviewed the following data within the [place] notes reviewed and incorporated, Labs reviewed , EKG interpreted  Old EKG reviewed, Old chart reviewed, Notes from prior ED visits and [place] Controlled Substance Database    Pertinent labs & imaging results that were available during my care of the patient were reviewed by me and considered in my medical decision making (see chart for details).    ____________________________________________   FINAL CLINICAL IMPRESSION(S) / ED DIAGNOSES  Final diagnoses:  Essential hypertension  Acute nonintractable headache, unspecified headache type  Chest pain, unspecified type      NEW MEDICATIONS STARTED DURING THIS VISIT:  ED Discharge Orders    None       Note:  This document was prepared using Dragon voice recognition software and may include unintentional dictation errors.    Alfred Levins, [place], MD 01/19/19 1058

## 2019-01-19 NOTE — ED Triage Notes (Signed)
Patient ambulatory to triage with complaints of HTN for 3-4 days with the last being 193/70 with slight HA and nausea.  Pt reports being faithful to meds, and pt takes warfarin for AFIB.     Pt also now reporting pressure and squeezing in the chest 7/10. No MI hx   Speaking in complete coherent sentences. No acute breathing distress noted.

## 2019-01-19 NOTE — ED Notes (Signed)
Patient reports she has noticed her blood pressure has been elevated over the past several days (monitors it at home) and spoke with her MD about it and he told her if it continued to be elevated to come to the ED.  Patient reports headache with nausea and chest discomfort on arrival.  Now while in treatment room reports continued headache nausea and chest pain have resolved.

## 2019-01-19 NOTE — ED Notes (Signed)
Dr Alfred Levins to bedside for d/c instructions and re-eval

## 2019-01-19 NOTE — Discharge Instructions (Signed)
You were seen for chest pain. Your workup today was reassuring. As I explained to you that does not mean that you do not have heart disease. You may need further evaluation to ensure you do not have a serious heart problem. Therefore it is imperative that you follow up with your doctor in 1-2 days for further evaluation.   For your BP: take your coreg and benicar as prescribed. Increase amlodipine to 10 mg daily. Keep a blood pressure diary and follow up with your doctor in 1-2 days.  Return to the emergency room if you have severe headache or chest pain, dizziness or shortness of breath.  When should you call for help?  Call 911 if: You passed out (lost consciousness) or if you feel dizzy. You have difficulty breathing. You have symptoms of a heart attack. These may include: Chest pain or pressure, or a strange feeling in your chest. Indigestion. Sweating. Shortness of breath. Nausea or vomiting. Pain, pressure, or a strange feeling in your back, neck, jaw, or upper belly or in one or both shoulders or arms. Lightheadedness or sudden weakness. A fast or irregular heartbeat. After you call 911, the operator may tell you to chew 1 adult-strength or 2 to 4 low-dose aspirin. Wait for an ambulance. Do not try to drive yourself.   Call your doctor today if: You have any trouble breathing. Your chest pain gets worse. You are dizzy or lightheaded, or you feel like you may faint. You are not getting better as expected. You are having new or different chest pain  How can you care for yourself at home? Rest until you feel better. Take your medicine exactly as prescribed. Call your doctor if you think you are having a problem with your medicine. Do not drive after taking a prescription pain medicine.

## 2019-02-14 ENCOUNTER — Telehealth: Payer: Self-pay

## 2019-02-14 NOTE — Telephone Encounter (Signed)
Patient declined and has new provider.  Deleting recall

## 2019-03-28 DIAGNOSIS — L84 Corns and callosities: Secondary | ICD-10-CM | POA: Insufficient documentation

## 2019-05-06 ENCOUNTER — Ambulatory Visit: Payer: Medicare HMO | Admitting: Oncology

## 2019-05-06 ENCOUNTER — Other Ambulatory Visit: Payer: Medicare HMO

## 2019-05-15 ENCOUNTER — Ambulatory Visit: Payer: Medicare HMO | Admitting: Oncology

## 2019-05-15 ENCOUNTER — Other Ambulatory Visit: Payer: Medicare HMO

## 2019-05-23 ENCOUNTER — Other Ambulatory Visit: Payer: Self-pay | Admitting: Student

## 2019-05-23 DIAGNOSIS — R948 Abnormal results of function studies of other organs and systems: Secondary | ICD-10-CM

## 2019-05-23 DIAGNOSIS — R140 Abdominal distension (gaseous): Secondary | ICD-10-CM

## 2019-05-23 DIAGNOSIS — R1084 Generalized abdominal pain: Secondary | ICD-10-CM

## 2019-05-23 DIAGNOSIS — Z8719 Personal history of other diseases of the digestive system: Secondary | ICD-10-CM

## 2019-05-23 DIAGNOSIS — K5909 Other constipation: Secondary | ICD-10-CM

## 2019-06-04 ENCOUNTER — Other Ambulatory Visit: Payer: Self-pay

## 2019-06-04 DIAGNOSIS — D509 Iron deficiency anemia, unspecified: Secondary | ICD-10-CM

## 2019-06-05 ENCOUNTER — Inpatient Hospital Stay (HOSPITAL_BASED_OUTPATIENT_CLINIC_OR_DEPARTMENT_OTHER): Payer: Medicare HMO | Admitting: Oncology

## 2019-06-05 ENCOUNTER — Inpatient Hospital Stay: Payer: Medicare HMO | Attending: Oncology

## 2019-06-05 ENCOUNTER — Encounter: Payer: Self-pay | Admitting: Oncology

## 2019-06-05 ENCOUNTER — Other Ambulatory Visit: Payer: Self-pay

## 2019-06-05 VITALS — BP 104/65 | HR 61 | Temp 96.5°F | Resp 18 | Wt 238.7 lb

## 2019-06-05 DIAGNOSIS — I130 Hypertensive heart and chronic kidney disease with heart failure and stage 1 through stage 4 chronic kidney disease, or unspecified chronic kidney disease: Secondary | ICD-10-CM

## 2019-06-05 DIAGNOSIS — N189 Chronic kidney disease, unspecified: Secondary | ICD-10-CM

## 2019-06-05 DIAGNOSIS — D631 Anemia in chronic kidney disease: Secondary | ICD-10-CM | POA: Insufficient documentation

## 2019-06-05 DIAGNOSIS — Z79899 Other long term (current) drug therapy: Secondary | ICD-10-CM

## 2019-06-05 DIAGNOSIS — N183 Chronic kidney disease, stage 3 (moderate): Secondary | ICD-10-CM | POA: Insufficient documentation

## 2019-06-05 DIAGNOSIS — D509 Iron deficiency anemia, unspecified: Secondary | ICD-10-CM

## 2019-06-05 DIAGNOSIS — Z862 Personal history of diseases of the blood and blood-forming organs and certain disorders involving the immune mechanism: Secondary | ICD-10-CM

## 2019-06-05 LAB — CBC WITH DIFFERENTIAL/PLATELET
Abs Immature Granulocytes: 0.03 10*3/uL (ref 0.00–0.07)
Basophils Absolute: 0 10*3/uL (ref 0.0–0.1)
Basophils Relative: 0 %
Eosinophils Absolute: 0.1 10*3/uL (ref 0.0–0.5)
Eosinophils Relative: 2 %
HCT: 27.6 % — ABNORMAL LOW (ref 36.0–46.0)
Hemoglobin: 9.3 g/dL — ABNORMAL LOW (ref 12.0–15.0)
Immature Granulocytes: 1 %
Lymphocytes Relative: 22 %
Lymphs Abs: 1.2 10*3/uL (ref 0.7–4.0)
MCH: 26.9 pg (ref 26.0–34.0)
MCHC: 33.7 g/dL (ref 30.0–36.0)
MCV: 79.8 fL — ABNORMAL LOW (ref 80.0–100.0)
Monocytes Absolute: 0.5 10*3/uL (ref 0.1–1.0)
Monocytes Relative: 9 %
Neutro Abs: 3.8 10*3/uL (ref 1.7–7.7)
Neutrophils Relative %: 66 %
Platelets: 146 10*3/uL — ABNORMAL LOW (ref 150–400)
RBC: 3.46 MIL/uL — ABNORMAL LOW (ref 3.87–5.11)
RDW: 15.4 % (ref 11.5–15.5)
WBC: 5.7 10*3/uL (ref 4.0–10.5)
nRBC: 0 % (ref 0.0–0.2)

## 2019-06-05 LAB — IRON AND TIBC
Iron: 53 ug/dL (ref 28–170)
Saturation Ratios: 20 % (ref 10.4–31.8)
TIBC: 261 ug/dL (ref 250–450)
UIBC: 208 ug/dL

## 2019-06-05 LAB — FERRITIN: Ferritin: 121 ng/mL (ref 11–307)

## 2019-06-05 NOTE — Progress Notes (Signed)
Patient here for follow up. No concerns voiced.  °

## 2019-06-08 NOTE — Progress Notes (Signed)
   Hematology/Oncology Consult note [facility]  [phone] [phone]  Patient Care Team: Linthavong, Kanhka, MD as PCP - General (Family Medicine) Gollan, Timothy J, MD as Consulting Physician (Cardiology) Lateef, Munsoor, MD as Consulting Physician (Nephrology) Taylor, Gregg W, MD as Consulting Physician (Cardiology) Bell, Phillip T., MD (Ophthalmology) Soberanes, Brent M, DPM as Consulting Physician (Podiatry)   Name of the patient: [name] [name]  [id number]  01/14/1935   Date of visit: 06/08/19  Diagnosis- microcytic anemia- multifactorial: HbC trait/ anemia of chronic kidney disease and component of iron deficiency anemia  Chief complaint/ Reason for visit- rouitne f/u of anemia  Heme/Onc history: patient is a 83-year-old female with a past medical history significant for hypertension and stage III chronic kidney disease among other medical problems. She also has hypertension hyperlipidemia and type 2 diabetes. She was recently found by Dr. V and was noted to have a hemoglobin of 9.4. Iron studies also revealed a ferritin of 22, iron saturation 13%. TIBC was normal at 302 on serum iron was low normal at 39 white count and platelets within normal limits. She has been referred to us for iron deficiency anemia. She has been seen by Dr. Wohlfrom GI the past and had a colonoscopy in 2017 which did not reveal any evidence of malignancy. 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 Finnegan in September 2017  Patient is taking oral iron once a day for over a month now and tolerating it without any significant side effects. She denies any consistent use of NSAIDs. Denies any bleeding and stools are docked artery stools. Denies any family history of colon cancer  Blood work from 05/24/2017 was as follows: CBC showed white count of  7.8, H&H of 10.2/30.2 with an MCV of 68.1 and a platelet count of 199. Ferritin levels were low at 14. Vitamin B12 is worse normal at 947. Folate was normal. Haptoglobin and TSH was normal. Iron studies were within normal limits. ESR was mildly elevated at 56. Multiple myeloma panel did not reveal any monoclonal protein. H. pylori stool antigen was negative.  Patient does have CKD and sees nephrology   Interval history- she has mild chronic fatigue. Denies nay new complaints at this time  ECOG PS- 1 Pain scale- 0   Review of systems- Review of Systems  Constitutional: Positive for malaise/fatigue. Negative for chills, fever and weight loss.  HENT: Negative for congestion, ear discharge and nosebleeds.   Eyes: Negative for blurred vision.  Respiratory: Negative for cough, hemoptysis, sputum production, shortness of breath and wheezing.   Cardiovascular: Negative for chest pain, palpitations, orthopnea and claudication.  Gastrointestinal: Negative for abdominal pain, blood in stool, constipation, diarrhea, heartburn, melena, nausea and vomiting.  Genitourinary: Negative for dysuria, flank pain, frequency, hematuria and urgency.  Musculoskeletal: Negative for back pain, joint pain and myalgias.  Skin: Negative for rash.  Neurological: Negative for dizziness, tingling, focal weakness, seizures, weakness and headaches.  Endo/Heme/Allergies: Does not bruise/bleed easily.  Psychiatric/Behavioral: Negative for depression and suicidal ideas. The patient does not have insomnia.       Allergies  Allergen Reactions  . Metoprolol Other (See Comments)    Bad dreams Patient stated that she had suicidal thoughts while taking this medication. Other reaction(s): Other (See Comments) Other reaction(s): Other (See Comments) Bad dreams Patient stated that she had suicidal thoughts while taking this medication. Bad dreams Patient stated   that she had suicidal thoughts while taking this medication.  Bad dreams Patient stated that she had suicidal thoughts while taking this medication. Other reaction(s): Other (See Comments) Bad dreams   . Diltiazem Other (See Comments)    Patient stated that she had bad dreams with this medication Other reaction(s): Other (See Comments) Patient stated that she had bad dreams with this medication Other reaction(s): Other (See Comments) Patient stated that she had bad dreams with this medication Patient stated that she had bad dreams with this medication Patient stated that she had bad dreams with this medication  . Levofloxacin Nausea And Vomiting and Nausea Only    Other reaction(s): Nausea And Vomiting     Past Medical History:  Diagnosis Date  . 1st degree AV block    a. PR 400 msec with Apacing  . Anemia   . Anxiety   . Arthritis    a. shoulders.  . Chest pain    a. 2004 Cath:  reportedly nl;  b. 09/2013 Neg MV.  . Chronic diastolic heart failure (HCC)    a. 09/2013 Echo: EF 50-55%, mild LVH, mild to mod MR/TR.  . Chronic kidney disease   . Congestive heart disease (HCC)   . Esophageal reflux   . H/O hiatal hernia   . History of UTI   . Hyperlipidemia   . Morbid obesity (HCC)   . OSA on CPAP   . PAF (paroxysmal atrial fibrillation) (HCC)   . SSS (sick sinus syndrome) (HCC)    a. 2008 s/p MDT PPM;  b. 11/2013 Gen change- MDT ADDRL1 Adapta DC PPM, ser # [id number].  . Symptomatic PVCs   . Syncope and collapse    a. Felt to be vasovagal.  . Thyroid disease    on no meds per pt  . Type II diabetes mellitus (HCC)   . Unspecified essential hypertension   . Unspecified glaucoma(365.9)      Past Surgical History:  Procedure Laterality Date  . APPENDECTOMY    . CARDIOVERSION N/A 04/26/2017   Procedure: Cardioversion;  Surgeon: Gollan, Timothy J, MD;  Location: [facility] ORS;  Service: Cardiovascular;  Laterality: N/A;  . DILATION AND CURETTAGE OF UTERUS    . INCISION AND DRAINAGE OF WOUND Right 03/1998   "leg; it was like a boil"  (12/01/2013)  . INSERT / REPLACE / REMOVE PACEMAKER  2008; 12/01/2013   MDT ADDRL1 pacemaker - gen change by Dr Klein 12/01/2013  . [facility]OPSY  [facility]   [facility]  . PERMANENT PACEMAKER GENERATOR CHANGE N/A 12/01/2013   Procedure: PERMANENT PACEMAKER GENERATOR CHANGE;  Surgeon: Steven C Klein, MD;  Location: [facility] CATH [facility];  Service: Cardiovascular;  Laterality: N/A;  . VAGINAL HYSTERECTOMY  1970    Social History   Socioeconomic History  . Marital status: Widowed    Spouse name: Not on file  . Number of children: Y  . Years of education: Not on file  . Highest education level: Not on file  Occupational History  . Occupation: retired  Social Needs  . Financial resource strain: Patient refused  . Food insecurity    Worry: Patient refused    Inability: Patient refused  . Transportation needs    Medical: Patient refused    Non-medical: Patient refused  Tobacco Use  . Smoking status: Former Smoker    Packs/day: 0.50    Years: 34.00    Pack years: 17.00    Types: Cigarettes    Quit date: 08/15/1989    Years   since quitting: 29.8  . Smokeless tobacco: Never Used  Substance and Sexual Activity  . Alcohol use: No    Alcohol/week: 0.0 standard drinks  . Drug use: No  . Sexual activity: Never  Lifestyle  . Physical activity    Days per week: Patient refused    Minutes per session: Patient refused  . Stress: Patient refused  Relationships  . Social Herbalist on phone: Patient refused    Gets together: Patient refused    Attends religious service: Patient refused    Active member of club or organization: Patient refused    Attends meetings of clubs or organizations: Patient refused    Relationship status: Patient refused  . Intimate partner violence    Fear of current or ex partner: Patient refused    Emotionally abused: Patient refused    Physically abused: Patient refused    Forced sexual activity: Patient refused  Other Topics Concern  . Not on file  Social History  Narrative   Retired. Widowed. Regularly exercises.     Family History  Problem Relation Age of Onset  . Heart disease Father   . Rheum arthritis Father   . Allergies Father   . Liver cancer Mother   . Pancreatic cancer Mother   . Hypertension Sister   . Rheum arthritis Sister   . Breast cancer Maternal Aunt      Current Outpatient Medications:  .  acetaminophen (TYLENOL) 500 MG tablet, Take 1,000 mg by mouth every 6 (six) hours as needed for moderate pain. , Disp: , Rfl:  .  allopurinol (ZYLOPRIM) 100 MG tablet, Take 200 mg by mouth daily., Disp: , Rfl:  .  amiodarone (PACERONE) 200 MG tablet, Take 100 mg by mouth 2 (two) times daily., Disp: , Rfl:  .  amLODipine (NORVASC) 10 MG tablet, TAKE 1 TABLET (10 MG TOTAL) BY MOUTH DAILY., Disp: 90 tablet, Rfl: 0 .  atorvastatin (LIPITOR) 20 MG tablet, TAKE 1 TABLET (20 MG TOTAL) BY MOUTH DAILY. (Patient taking differently: Take 20 mg by mouth at bedtime. ), Disp: 90 tablet, Rfl: 0 .  Brinzolamide-Brimonidine (SIMBRINZA) 1-0.2 % SUSP, Place 1 drop into both eyes 2 (two) times daily., Disp: 1 Bottle, Rfl: 1 .  carvedilol (COREG) 25 MG tablet, Take 25-50 mg by mouth See admin instructions. Take 1 tablet (25MG) by mouth every morning and 2 tablets (50MG) by mouth every night before bed, Disp: , Rfl:  .  diclofenac sodium (VOLTAREN) 1 % GEL, Apply 2 g topically 4 (four) times daily., Disp: , Rfl:  .  esomeprazole (NEXIUM) 40 MG capsule, TAKE 1 CAPSULE (40 MG TOTAL) BY MOUTH DAILY., Disp: 90 capsule, Rfl: 1 .  ferrous sulfate 325 (65 FE) MG EC tablet, Take 325 mg by mouth 2 (two) times daily with a meal. , Disp: , Rfl:  .  glipiZIDE (GLUCOTROL) 10 MG tablet, TAKE 1 TABLET (10 MG TOTAL) BY MOUTH 2 (TWO) TIMES DAILY BEFORE A MEAL., Disp: 180 tablet, Rfl: 0 .  Glycerin, Laxative, (FLEET LIQUID GLYCERIN SUPP RE), Place 1 Dose rectally daily as needed (constipation)., Disp: , Rfl:  .  KLOR-CON M10 10 MEQ tablet, Take 1 tablet by mouth daily., Disp: , Rfl:  3 .  metFORMIN (GLUCOPHAGE-XR) 500 MG 24 hr tablet, TAKE 1 TABLET (500 MG TOTAL) BY MOUTH 2 (TWO) TIMES DAILY., Disp: 180 tablet, Rfl: 0 .  mometasone (ELOCON) 0.1 % cream, Apply topically daily. Apply to affected area as directed two times a  day (Patient taking differently: Apply topically daily. Apply to affected area as directed two times a day as needed), Disp: 45 g, Rfl: 0 .  Multiple Vitamins-Minerals (CENTRUM SILVER PO), Take 1 tablet by mouth daily. , Disp: , Rfl:  .  olmesartan (BENICAR) 40 MG tablet, TAKE 1 TABLET (40 MG TOTAL) BY MOUTH DAILY., Disp: 90 tablet, Rfl: 0 .  polyethylene glycol (MIRALAX / GLYCOLAX) packet, Take 17 g by mouth daily as needed for moderate constipation., Disp: , Rfl:  .  TRADJENTA 5 MG TABS tablet, TAKE 1 TABLET (5 MG TOTAL) BY MOUTH DAILY. (Patient taking differently: TAKE 1 TABLET (5 MG TOTAL) BY MOUTH DAILY WITH BREAKFAST), Disp: 30 tablet, Rfl: 2 .  travoprost, benzalkonium, (TRAVATAN) 0.004 % ophthalmic solution, Place 1 drop into both eyes at bedtime. , Disp: , Rfl:  .  warfarin (COUMADIN) 1 MG tablet, Take 3-4 mg by mouth daily. Take 3MG by mouth every Monday, Tuesday, Wednesday, Thursday and Friday evening and take 4MG by mouth every Saturday and Sunday evening, Disp: , Rfl:  .  warfarin (COUMADIN) 3 MG tablet, TAKE 1-1.5 TABLETS DAILY AT 6 PM. TAKE 1 TAB ON TUES, THURS, SAT, SUN. TAKE 4.5 MG ON MON, WED, FRI, Disp: 100 tablet, Rfl: 3 .  carvedilol (COREG) 12.5 MG tablet, TAKE 1 TABLET BY MOUTH IN THE MORNING WITH BREAKFAST AND 2 TABLETS IN THE EVENING BEFORE BED (Patient not taking: Reported on 06/05/2019), Disp: 270 tablet, Rfl: 1 .  furosemide (LASIX) 20 MG tablet, Take 1 tablet (20 mg total) by mouth daily. (Patient not taking: Reported on 06/05/2019), Disp: 90 tablet, Rfl: 0 .  predniSONE (DELTASONE) 10 MG tablet, Label  & dispense according to the schedule below. 5 Pills PO for 1 day then, 4 Pills PO for 1 day, 3 Pills PO for 1 day, 2 Pills PO for 1 day, 1  Pill PO for 1 days then STOP. (Patient not taking: Reported on 06/05/2019), Disp: 15 tablet, Rfl: 0  Physical exam:  Vitals:   06/05/19 0948  BP: 104/65  Pulse: 61  Resp: 18  Temp: (!) 96.5 F (35.8 C)  Weight: 238 lb 11.2 oz (108.3 kg)   Physical Exam HENT:     Head: Normocephalic and atraumatic.  Eyes:     Pupils: Pupils are equal, round, and reactive to light.  Neck:     Musculoskeletal: Normal range of motion.  Cardiovascular:     Rate and Rhythm: Normal rate and regular rhythm.     Heart sounds: Normal heart sounds.  Pulmonary:     Effort: Pulmonary effort is normal.     Breath sounds: Normal breath sounds.  Abdominal:     General: Bowel sounds are normal.     Palpations: Abdomen is soft.  Skin:    General: Skin is warm and dry.  Neurological:     Mental Status: She is alert and oriented to person, place, and time.      CMP Latest Ref Rng & Units 01/19/2019  Glucose 70 - 99 mg/dL 82  BUN 8 - 23 mg/dL 22  Creatinine 0.44 - 1.00 mg/dL 1.93(H)  Sodium 135 - 145 mmol/L 141  Potassium 3.5 - 5.1 mmol/L 3.3(L)  Chloride 98 - 111 mmol/L 107  CO2 22 - 32 mmol/L 26  Calcium 8.9 - 10.3 mg/dL 9.5  Total Protein 6.5 - 8.1 g/dL 6.9  Total Bilirubin 0.3 - 1.2 mg/dL 1.0  Alkaline Phos 38 - 126 U/L 56  AST 15 - 41  U/L 19  ALT 0 - 44 U/L 17   CBC Latest Ref Rng & Units 06/05/2019  WBC 4.0 - 10.5 K/uL 5.7  Hemoglobin 12.0 - 15.0 g/dL 9.3(L)  Hematocrit 36.0 - 46.0 % 27.6(L)  Platelets 150 - 400 K/uL 146(L)    Assessment and plan- Patient is a 83 y.o. female with multifactorial anemia- hb C trait/ anemia of chronic kidney disease and component of iron deficiency  Patient's baseline hemoglobin runs between 10-11.  There is a little lower today at 9.3 today I suspect this is secondary to anemia of chronic kidney disease.  She does have chronic microcytosis likely secondary to hemoglobin C trait.  Iron studies at this time are normal.  Given that her hemoglobin is close to 10 she  does not require any EPO at this time.  I will repeat CBC ferritin and iron studies in 3 in 6 months and see her back in 6 months.  If her hemoglobin is lower I will consider starting EPO at that time   Visit Diagnosis 1. Iron deficiency anemia, unspecified iron deficiency anemia type   2. History of anemia due to chronic kidney disease      Dr.  , MD, MPH [facility] at [facility] [phone] 06/08/2019 12:30 PM                

## 2019-06-18 ENCOUNTER — Ambulatory Visit
Admission: RE | Admit: 2019-06-18 | Discharge: 2019-06-18 | Disposition: A | Discharge status: Home / Self Care | Payer: Medicare HMO | Source: Ambulatory Visit | Attending: Student | Admitting: Student

## 2019-06-18 ENCOUNTER — Other Ambulatory Visit: Payer: Self-pay

## 2019-06-18 DIAGNOSIS — K5909 Other constipation: Secondary | ICD-10-CM

## 2019-06-18 DIAGNOSIS — R948 Abnormal results of function studies of other organs and systems: Secondary | ICD-10-CM

## 2019-06-18 DIAGNOSIS — R1084 Generalized abdominal pain: Secondary | ICD-10-CM

## 2019-06-18 DIAGNOSIS — Z8719 Personal history of other diseases of the digestive system: Secondary | ICD-10-CM

## 2019-06-18 DIAGNOSIS — R140 Abdominal distension (gaseous): Secondary | ICD-10-CM

## 2019-06-19 ENCOUNTER — Other Ambulatory Visit: Payer: Self-pay | Admitting: Student

## 2019-06-19 ENCOUNTER — Ambulatory Visit
Admission: RE | Admit: 2019-06-19 | Discharge: 2019-06-19 | Disposition: A | Discharge status: Home / Self Care | Payer: Medicare HMO | Source: Ambulatory Visit | Attending: Student | Admitting: Student

## 2019-06-19 ENCOUNTER — Other Ambulatory Visit: Payer: Self-pay

## 2019-06-19 DIAGNOSIS — R1084 Generalized abdominal pain: Secondary | ICD-10-CM | POA: Diagnosis present

## 2019-06-19 DIAGNOSIS — R948 Abnormal results of function studies of other organs and systems: Secondary | ICD-10-CM | POA: Diagnosis not present

## 2019-06-19 DIAGNOSIS — Z8719 Personal history of other diseases of the digestive system: Secondary | ICD-10-CM

## 2019-06-19 DIAGNOSIS — R140 Abdominal distension (gaseous): Secondary | ICD-10-CM

## 2019-06-19 DIAGNOSIS — K5909 Other constipation: Secondary | ICD-10-CM

## 2019-06-25 ENCOUNTER — Other Ambulatory Visit: Payer: Self-pay | Admitting: Student

## 2019-06-25 DIAGNOSIS — R1310 Dysphagia, unspecified: Secondary | ICD-10-CM

## 2019-06-25 DIAGNOSIS — R1319 Other dysphagia: Secondary | ICD-10-CM

## 2019-07-02 ENCOUNTER — Ambulatory Visit
Admission: RE | Admit: 2019-07-02 | Discharge: 2019-07-02 | Disposition: A | Discharge status: Home / Self Care | Payer: Medicare HMO | Source: Ambulatory Visit | Attending: Student | Admitting: Student

## 2019-07-02 ENCOUNTER — Other Ambulatory Visit: Payer: Self-pay

## 2019-07-02 DIAGNOSIS — R1319 Other dysphagia: Secondary | ICD-10-CM

## 2019-07-02 DIAGNOSIS — R1310 Dysphagia, unspecified: Secondary | ICD-10-CM | POA: Diagnosis not present

## 2019-07-17 ENCOUNTER — Other Ambulatory Visit: Payer: Self-pay | Admitting: Student

## 2019-07-17 DIAGNOSIS — R1310 Dysphagia, unspecified: Secondary | ICD-10-CM

## 2019-08-04 ENCOUNTER — Other Ambulatory Visit: Payer: Self-pay | Admitting: Otolaryngology

## 2019-08-04 DIAGNOSIS — R1310 Dysphagia, unspecified: Secondary | ICD-10-CM

## 2019-08-04 DIAGNOSIS — R220 Localized swelling, mass and lump, head: Secondary | ICD-10-CM

## 2019-08-12 ENCOUNTER — Ambulatory Visit: Payer: Medicare HMO | Admitting: *Deleted

## 2019-08-14 ENCOUNTER — Ambulatory Visit
Admission: RE | Admit: 2019-08-14 | Discharge: 2019-08-14 | Disposition: A | Discharge status: Home / Self Care | Payer: Medicare HMO | Source: Ambulatory Visit | Attending: Student | Admitting: Student

## 2019-08-14 ENCOUNTER — Other Ambulatory Visit: Payer: Self-pay

## 2019-08-14 DIAGNOSIS — R1310 Dysphagia, unspecified: Secondary | ICD-10-CM | POA: Diagnosis not present

## 2019-08-14 NOTE — Therapy (Signed)
[place], [place], [address] Phone: [phone]   Fax:     Modified Barium Swallow  Patient Details  Name: [name] [name] MRN: [id number] Date of Birth: 1935/10/24 No data recorded  Encounter Date: 08/14/2019  End of Session - 08/14/19 1547    Visit Number  1    Number of Visits  1    Date for SLP Re-Evaluation  08/14/19    SLP Start Time  1245    SLP Stop Time   1345    SLP Time Calculation (min)  60 min    Activity Tolerance  Patient tolerated treatment well       Past Medical History:  Diagnosis Date  . 1st degree AV block    a. PR 400 msec with Apacing  . Anemia   . Anxiety   . Arthritis    a. shoulders.  . Chest pain    a. 2004 Cath:  reportedly nl;  b. 09/2013 Neg MV.  . Chronic diastolic heart failure ([place])    a. 09/2013 Echo: EF 50-55%, mild LVH, mild to mod MR/TR.  Marland Kitchen Chronic kidney disease   . Congestive heart disease ([place])   . Esophageal reflux   . H/O hiatal hernia   . History of UTI   . Hyperlipidemia   . Morbid obesity ([place])   . OSA on CPAP   . PAF (paroxysmal atrial fibrillation) ([place])   . SSS (sick sinus syndrome) ([place])    a. 2008 s/p MDT PPM;  b. 11/2013 Gen change- MDT ADDRL1 Adapta DC PPM, ser # [id number].  . Symptomatic PVCs   . Syncope and collapse    a. Felt to be vasovagal.  . Thyroid disease    on no meds per pt  . Type II diabetes mellitus ([place])   . Unspecified essential hypertension   . Unspecified glaucoma(365.9)     Past Surgical History:  Procedure Laterality Date  . APPENDECTOMY    . CARDIOVERSION N/A 04/26/2017   Procedure: Cardioversion;  Surgeon: Minna Merritts, MD;  Location: [facility] ORS;  Service: Cardiovascular;  Laterality: N/A;  . DILATION AND CURETTAGE OF UTERUS    . INCISION AND DRAINAGE OF WOUND Right 03/1998   "leg; it was like a boil" (12/01/2013)  . INSERT / REPLACE / REMOVE PACEMAKER  2008; 12/01/2013   MDT ADDRL1 pacemaker -  gen change by Dr Caryl Comes 12/01/2013  . LUNG BIOPSY  2010   [facility]  . PERMANENT PACEMAKER GENERATOR CHANGE N/A 12/01/2013   Procedure: PERMANENT PACEMAKER GENERATOR CHANGE;  Surgeon: Deboraha Sprang, MD;  Location: [facility] CATH LAB;  Service: Cardiovascular;  Laterality: N/A;  . VAGINAL HYSTERECTOMY  1970    There were no vitals filed for this visit.       Subjective: Patient behavior: (alertness, ability to follow instructions, etc.): pt pleasant, engaging and verbally described s/s of Esophageal dysmotility and discomfort intermittently when eating/drinking at meals. She also endorsed s/s of Reflux "during the night time" stating she had been on Nexium (PPI) "for years but they've changed me to some other medications recently" - referring to her GI and ENT MDs. Pt stated she felt her Reflux was "better right now" but she was concerned about "side effects" - encouraged her to communicate this to her MDs. Pt also stated she felt like she "had too many doctors". Pt has a h/o multiple medical dxs including morbid obesity, Hiatal Hernia, GERD.  Chief complaint: dysphagia.  OM exam WFL; denture, partial plate denture.    Objective:  Radiological Procedure: A videoflouroscopic evaluation of oral-preparatory, reflex initiation, and pharyngeal phases of the swallow was performed; as well as a screening of the upper esophageal phase.  I. POSTURE: upright II. VIEW: lateral III. COMPENSATORY STRATEGIES: NONE IV. BOLUSES ADMINISTERED:  Thin Liquid: 6 trials, multiple swallows x1  Nectar-thick Liquid: 1 trial  Honey-thick Liquid: NT   Puree: 3 trials  Mechanical Soft: 2 trials V. RESULTS OF EVALUATION: A. ORAL PREPARATORY PHASE: (The lips, tongue, and velum are observed for strength and coordination)       **Overall Severity Rating: WFL. Timely bolus management and bolus control; mastication adequate for breakdown of bolus for swallow. Oral clearing achieved post each trial - pt often used a f/u, dry swallow  to clear any bolus residue from around the Denture plates.   B. SWALLOW INITIATION/REFLEX: (The reflex is normal if "triggered" by the time the bolus reached the base of the tongue)  **Overall Severity Rating: [facility]. Timing of the pharyngeal swallow initiation was appropriate; full airway closure achieved. NO laryngeal penetration or aspiration noted to occur w/ trials.   C. PHARYNGEAL PHASE: (Pharyngeal function is normal if the bolus shows rapid, smooth, and continuous transit through the pharynx and there is no pharyngeal residue after the swallow)  **Overall Severity Rating: [facility]. No pharyngeal residue remained post swallowing of trial consistencies indicating adequate laryngeal excursion and pharyngeal pressure during the swallow.  D. LARYNGEAL PENETRATION: (Material entering into the laryngeal inlet/vestibule but not aspirated): NONE E. ASPIRATION: NONE F. ESOPHAGEAL PHASE: (Screening of the upper esophagus): no cervical Esophagus dysmotility noted; min slower bolus clearing noted in mid-lower Esophagus during brief viewing(defer to ongoing GI assessments)   ASSESSMENT: Pt appeared to present w/ No oropharyngeal phase dysphagia - a functional, normal swallow w/ No aspiration noted w/ trials during this study. Pt's complaints appeared to center around Esophageal phase dysmotility and her baseline GERD. Encouraged pt to continue f/u w/ GI, ENT for management.  During the oral phase, timely bolus management, bolus control, and A-P transfer w/ all trials; mastication of trials of increased texture was adequate for breakdown of bolus for swallowing. Oral clearing achieved post each trial - pt often used a f/u, dry swallow to clear any bolus residue from around the Denture plates. During the pharyngeal phase, timing of the pharyngeal swallow initiation was appropriate w/ all trial consistencies(BOT-Valleculae); full airway closure achieved. NO laryngeal penetration or aspiration noted to occur w/ trials.  No pharyngeal residue remained post swallowing of trial consistencies indicating adequate laryngeal excursion and pharyngeal pressure during the swallow. During the Esophageal phase, no cervical Esophagus dysmotility noted; min slower bolus clearing noted in mid-lower Esophagus during a brief viewing(defer to ongoing GI assessments).    PLAN/RECOMMENDATIONS:  A. Diet: Regular diet w/ thin liquids; foods cut small and moistened well.  Lessen meats/breads in diet as these foods can negatively impact Esophageal motility in light of GERD and Hiatal Hernia; also less Carbonated drinks DURING meals.   B. Swallowing Precautions: REFLUX precautions as per dx of GERD; general aspiration precautions   C. Recommended consultation to: continue f/u w/ GI, ENT for ongoing management of GERD, Hiatal Hernia  D. Therapy recommendations: None  E. Results and recommendations were discussed w/ pt; video viewed and questions answered            Dysphagia, unspecified type - Plan: DG SWALLOW FUNC OP MEDICARE SPEECH PATH, [place]  Problem List Patient Active Problem List   Diagnosis Date Noted  . SOB (shortness of breath) 12/01/2018  . Eczema 04/03/2017  . Glaucoma (increased eye pressure) 04/03/2017  . Hiatal hernia 04/03/2017  . Iron deficiency anemia 04/03/2017  . Osteoarthritis 04/03/2017  . Pure hypercholesterolemia 04/03/2017  . Tinnitus 04/03/2017  . Valvular heart disease 04/03/2017  . Venous stasis 04/03/2017  . Bilateral hearing loss 03/07/2017  . Tinnitus, bilateral 03/07/2017  . Pounding noise in right ear 02/13/2017  . Eczematous skin lesions 02/02/2017  . URI with cough and congestion 12/13/2016  . Colicky RLQ abdominal pain 11/01/2016  . 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 Anxiety 07/28/2016  . Acute maxillary sinusitis 03/23/2016  . Pain of right hip joint 03/23/2016  . CKD (chronic kidney disease) stage 3, GFR 30-59 ml/min (HCC)  02/03/2016  . Chronic gout of right foot 12/16/2015  . Productive cough 10/26/2015  . Infection of skin of left ear lobe 10/11/2015  . Acute non-recurrent maxillary sinusitis 09/17/2015  . Allergic rhinitis 09/14/2015  . Hyperlipidemia 09/14/2015  . Gout attack 07/21/2015  . Anticoagulation monitoring, INR range 2-3 05/18/2015  . RLS (restless legs syndrome) 04/14/2015  . History of colon polyps 12/23/2014  . Crohn's disease in remission ([place]) 09/15/2014  . 1st degree AV block   . Chronic diastolic heart failure ([place])   . Cardiac arrhythmia 12/01/2013  . Hemoptysis 11/24/2013  . Acute on chronic diastolic CHF (congestive heart failure) ([place]) 10/17/2013  . Episodic lightheadedness 07/16/2013  . Palpitations 07/16/2013  . Syncope 08/15/2012  . Back pain 05/30/2012  . OSA (obstructive sleep apnea) 01/08/2012  . DYSPNEA 01/28/2010  . PVC (premature ventricular contraction) 09/13/2009  . Flutter-fibrillation ([place]) 09/13/2009  . EDEMA 08/23/2009  . Diabetes mellitus type 2, controlled, without complications ([place]) 00/76/2263  . GLAUCOMA 04/13/2009  . Essential hypertension 04/13/2009  . PAF (paroxysmal atrial fibrillation) ([place]) 04/13/2009  . SICK SINUS/ TACHY-BRADY SYNDROME 04/13/2009  . GERD 04/13/2009  . Cardiac pacemaker - [employer] 04/13/2009       Orinda Kenner, [place], CCC-SLP Nassim Cosma 08/14/2019, 3:48 PM  [facility] [place], [place], [address] Phone: [phone]   Fax:     Name: DAWNNA GRITZ MRN: [id number] Date of Birth: [birth year]

## 2019-08-18 ENCOUNTER — Other Ambulatory Visit: Payer: Self-pay

## 2019-08-18 ENCOUNTER — Ambulatory Visit
Admission: RE | Admit: 2019-08-18 | Discharge: 2019-08-18 | Disposition: A | Discharge status: Home / Self Care | Payer: Medicare HMO | Source: Ambulatory Visit | Attending: Otolaryngology | Admitting: Otolaryngology

## 2019-08-18 DIAGNOSIS — R220 Localized swelling, mass and lump, head: Secondary | ICD-10-CM | POA: Diagnosis not present

## 2019-08-18 DIAGNOSIS — R221 Localized swelling, mass and lump, neck: Secondary | ICD-10-CM | POA: Diagnosis present

## 2019-08-18 DIAGNOSIS — R1310 Dysphagia, unspecified: Secondary | ICD-10-CM | POA: Diagnosis present

## 2019-09-03 ENCOUNTER — Ambulatory Visit: Payer: Medicare HMO

## 2019-09-30 DIAGNOSIS — D631 Anemia in chronic kidney disease: Secondary | ICD-10-CM | POA: Insufficient documentation

## 2019-10-02 DIAGNOSIS — R600 Localized edema: Secondary | ICD-10-CM | POA: Insufficient documentation

## 2019-10-02 DIAGNOSIS — N2581 Secondary hyperparathyroidism of renal origin: Secondary | ICD-10-CM | POA: Insufficient documentation

## 2019-10-14 ENCOUNTER — Other Ambulatory Visit: Payer: Self-pay | Admitting: *Deleted

## 2019-10-14 DIAGNOSIS — N189 Chronic kidney disease, unspecified: Secondary | ICD-10-CM

## 2019-10-14 DIAGNOSIS — D509 Iron deficiency anemia, unspecified: Secondary | ICD-10-CM

## 2019-10-14 DIAGNOSIS — D582 Other hemoglobinopathies: Secondary | ICD-10-CM

## 2019-10-14 DIAGNOSIS — D631 Anemia in chronic kidney disease: Secondary | ICD-10-CM

## 2019-10-16 ENCOUNTER — Other Ambulatory Visit: Payer: Self-pay

## 2019-10-16 ENCOUNTER — Encounter: Payer: Self-pay | Admitting: Oncology

## 2019-10-16 ENCOUNTER — Inpatient Hospital Stay (HOSPITAL_BASED_OUTPATIENT_CLINIC_OR_DEPARTMENT_OTHER): Payer: Medicare HMO | Admitting: Oncology

## 2019-10-16 ENCOUNTER — Inpatient Hospital Stay: Payer: Medicare HMO | Attending: Oncology

## 2019-10-16 VITALS — BP 110/66 | HR 63 | Temp 96.9°F | Wt 243.0 lb

## 2019-10-16 DIAGNOSIS — D509 Iron deficiency anemia, unspecified: Secondary | ICD-10-CM

## 2019-10-16 DIAGNOSIS — N183 Chronic kidney disease, stage 3 unspecified: Secondary | ICD-10-CM | POA: Diagnosis not present

## 2019-10-16 DIAGNOSIS — N189 Chronic kidney disease, unspecified: Secondary | ICD-10-CM

## 2019-10-16 DIAGNOSIS — E785 Hyperlipidemia, unspecified: Secondary | ICD-10-CM | POA: Insufficient documentation

## 2019-10-16 DIAGNOSIS — D582 Other hemoglobinopathies: Secondary | ICD-10-CM

## 2019-10-16 DIAGNOSIS — I130 Hypertensive heart and chronic kidney disease with heart failure and stage 1 through stage 4 chronic kidney disease, or unspecified chronic kidney disease: Secondary | ICD-10-CM | POA: Insufficient documentation

## 2019-10-16 DIAGNOSIS — D631 Anemia in chronic kidney disease: Secondary | ICD-10-CM | POA: Diagnosis not present

## 2019-10-16 DIAGNOSIS — E1122 Type 2 diabetes mellitus with diabetic chronic kidney disease: Secondary | ICD-10-CM | POA: Diagnosis not present

## 2019-10-16 LAB — COMPREHENSIVE METABOLIC PANEL
ALT: 27 U/L (ref 0–44)
AST: 24 U/L (ref 15–41)
Albumin: 3.8 g/dL (ref 3.5–5.0)
Alkaline Phosphatase: 72 U/L (ref 38–126)
Anion gap: 7 (ref 5–15)
BUN: 30 mg/dL — ABNORMAL HIGH (ref 8–23)
CO2: 25 mmol/L (ref 22–32)
Calcium: 9.3 mg/dL (ref 8.9–10.3)
Chloride: 105 mmol/L (ref 98–111)
Creatinine, Ser: 3.08 mg/dL — ABNORMAL HIGH (ref 0.44–1.00)
GFR calc Af Amer: 16 mL/min — ABNORMAL LOW (ref >60)
GFR calc non Af Amer: 14 mL/min — ABNORMAL LOW (ref >60)
Glucose, Bld: 217 mg/dL — ABNORMAL HIGH (ref 70–99)
Potassium: 4 mmol/L (ref 3.5–5.1)
Sodium: 137 mmol/L (ref 135–145)
Total Bilirubin: 0.6 mg/dL (ref 0.3–1.2)
Total Protein: 6.5 g/dL (ref 6.5–8.1)

## 2019-10-16 LAB — CBC
HCT: 27.1 % — ABNORMAL LOW (ref 36.0–46.0)
Hemoglobin: 9.1 g/dL — ABNORMAL LOW (ref 12.0–15.0)
MCH: 27 pg (ref 26.0–34.0)
MCHC: 33.6 g/dL (ref 30.0–36.0)
MCV: 80.4 fL (ref 80.0–100.0)
Platelets: 133 10*3/uL — ABNORMAL LOW (ref 150–400)
RBC: 3.37 MIL/uL — ABNORMAL LOW (ref 3.87–5.11)
RDW: 15.9 % — ABNORMAL HIGH (ref 11.5–15.5)
WBC: 5.2 10*3/uL (ref 4.0–10.5)
nRBC: 0 % (ref 0.0–0.2)

## 2019-10-16 LAB — VITAMIN B12: Vitamin B-12: 834 pg/mL (ref 180–914)

## 2019-10-16 LAB — IRON AND TIBC
Iron: 59 ug/dL (ref 28–170)
Saturation Ratios: 22 % (ref 10.4–31.8)
TIBC: 274 ug/dL (ref 250–450)
UIBC: 215 ug/dL

## 2019-10-16 LAB — FERRITIN: Ferritin: 101 ng/mL (ref 11–307)

## 2019-10-16 NOTE — Progress Notes (Signed)
Patient stated that she had been doing well with no complaints. Patient stated that she had not seen any blood.

## 2019-10-17 NOTE — Progress Notes (Signed)
Hematology/Oncology Consult note [facility]  [phone] [phone]  Patient Care Team: Dion Body, MD as PCP - General (Family Medicine) Minna Merritts, MD as Consulting Physician (Cardiology) Anthonette Legato, MD as Consulting Physician (Nephrology) Mellin Lance, MD as Consulting Physician (Cardiology) Lorelee Cover., MD (Ophthalmology) Edrick Kins, DPM as Consulting Physician (Podiatry)   Name of the patient: [name] [name]  [id number]  11/03/1935   Date of visit: 10/17/19  Diagnosis-chronic anemia multifactorial secondary to anemia of chronic kidney disease and a component of iron deficiency  Chief complaint/ Reason for visit-routine follow-up of anemia  Heme/Onc history: patient is a 84 year old female with a past medical history significant for hypertension and stage III chronic kidney disease among other medical problems. She also has hypertension hyperlipidemia and type 2 diabetes. She was recently found by Dr. Clayton Bibles and was noted to have a hemoglobin of 9.4. Iron studies also revealed a ferritin of 22, iron saturation 13%. TIBC was normal at 302 on serum iron was low normal at 39 white count and platelets within normal limits. She has been referred to [place] for iron deficiency anemia. She has been seen by Dr. Remi Haggard GI the past and had a colonoscopy in 2017 which did not reveal any evidence of malignancy. 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 Grayland Ormond in September 2017  Patient is taking oral iron once a day for over a month now and tolerating it without any significant side effects. She denies any consistent use of NSAIDs. Denies any bleeding and stools are docked artery stools. Denies any family history of colon cancer  Blood work from 05/24/2017 was as follows: CBC showed white count of  7.8, H&H of 10.2/30.2 with an MCV of 68.1 and a platelet count of 199. Ferritin levels were low at 14. Vitamin B12 is worse normal at 947. Folate was normal. Haptoglobin and TSH was normal. Iron studies were within normal limits. ESR was mildly elevated at 56. Multiple myeloma panel did not reveal any monoclonal protein. H. pylori stool antigen was negative.  Patient does have CKD and sees nephrology   Interval history-other than mild fatigue patient denies any other complaints at this time.  Denies any blood in stool or urine  ECOG PS- 1 Pain scale- 0   Review of systems- Review of Systems  Constitutional: Positive for malaise/fatigue. Negative for chills, fever and weight loss.  HENT: Negative for congestion, ear discharge and nosebleeds.   Eyes: Negative for blurred vision.  Respiratory: Negative for cough, hemoptysis, sputum production, shortness of breath and wheezing.   Cardiovascular: Negative for chest pain, palpitations, orthopnea and claudication.  Gastrointestinal: Negative for abdominal pain, blood in stool, constipation, diarrhea, heartburn, melena, nausea and vomiting.  Genitourinary: Negative for dysuria, flank pain, frequency, hematuria and urgency.  Musculoskeletal: Negative for back pain, joint pain and myalgias.  Skin: Negative for rash.  Neurological: Negative for dizziness, tingling, focal weakness, seizures, weakness and headaches.  Endo/Heme/Allergies: Does not bruise/bleed easily.  Psychiatric/Behavioral: Negative for depression and suicidal ideas. The patient does not have insomnia.       Allergies  Allergen Reactions   Metoprolol Other (See Comments)    Bad dreams Patient stated that she had suicidal thoughts while taking this medication. Other reaction(s): Other (See Comments) Other reaction(s): Other (See Comments) Bad dreams Patient stated that she had suicidal thoughts while taking this  medication. Bad dreams Patient stated that she had suicidal  thoughts while taking this medication. Bad dreams Patient stated that she had suicidal thoughts while taking this medication. Other reaction(s): Other (See Comments) Bad dreams    Diltiazem Other (See Comments)    Patient stated that she had bad dreams with this medication Other reaction(s): Other (See Comments) Patient stated that she had bad dreams with this medication Other reaction(s): Other (See Comments) Patient stated that she had bad dreams with this medication Patient stated that she had bad dreams with this medication Patient stated that she had bad dreams with this medication   Levofloxacin Nausea And Vomiting and Nausea Only    Other reaction(s): Nausea And Vomiting     Past Medical History:  Diagnosis Date   1st degree AV block    a. PR 400 msec with Apacing   Anemia    Anxiety    Arthritis    a. shoulders.   Chest pain    a. 2004 Cath:  reportedly nl;  b. 09/2013 Neg MV.   Chronic diastolic heart failure ([place])    a. 09/2013 Echo: EF 50-55%, mild LVH, mild to mod MR/TR.   Chronic kidney disease    Congestive heart disease ([place])    Esophageal reflux    H/O hiatal hernia    History of UTI    Hyperlipidemia    Morbid obesity ([place])    OSA on CPAP    PAF (paroxysmal atrial fibrillation) (H[facility])    SSS (sick sinus syndrome) ([place])    a. 2008 s/p MDT PPM;  b. 11/2013 Gen change- MDT ADDRL1 Adapta DC PPM, ser # [id number].   Symptomatic PVCs    Syncope and collapse    a. Felt to be vasovagal.   Thyroid disease    on no meds per pt   Type II diabetes mellitus ([place])    Unspecified essential hypertension    Unspecified glaucoma(365.9)      Past Surgical History:  Procedure Laterality Date   APPENDECTOMY     CARDIOVERSION N/A 04/26/2017   Procedure: Cardioversion;  Surgeon: Minna Merritts, MD;  Location: [facility] ORS;  Service: Cardiovascular;  Laterality: N/A;   DILATION AND CURETTAGE OF UTERUS     INCISION AND DRAINAGE OF WOUND  Right 03/1998   "leg; it was like a boil" (12/01/2013)   INSERT / REPLACE / REMOVE PACEMAKER  2008; 12/01/2013   MDT ADDRL1 pacemaker - gen change by Dr Caryl Comes 12/01/2013   LUNG BIOPSY  2010   [facility]   [place] N/A 12/01/2013   Procedure: PERMANENT PACEMAKER GENERATOR CHANGE;  Surgeon: Deboraha Sprang, MD;  Location: [facility] CATH LAB;  Service: Cardiovascular;  Laterality: N/A;   VAGINAL HYSTERECTOMY  1970    Social History   Socioeconomic History   Marital status: Widowed    Spouse name: Not on file   Number of children: Y   Years of education: Not on file   Highest education level: Not on file  Occupational History   Occupation: retired  Scientist, product/process development strain: Patient refused   Food insecurity    Worry: Patient refused    Inability: Patient refused   Transportation needs    Medical: Patient refused    Non-medical: Patient refused  Tobacco Use   Smoking status: Former Smoker    Packs/day: 0.50    Years: 34.00    Pack years: 17.00    Types: Cigarettes    Quit date:  08/15/1989    Years since quitting: 30.1   Smokeless tobacco: Never Used  Substance and Sexual Activity   Alcohol use: No    Alcohol/week: 0.0 standard drinks   Drug use: No   Sexual activity: Never  Lifestyle   Physical activity    Days per week: Patient refused    Minutes per session: Patient refused   Stress: Patient refused  Relationships   Social connections    Talks on phone: Patient refused    Gets together: Patient refused    Attends religious service: Patient refused    Active member of club or organization: Patient refused    Attends meetings of clubs or organizations: Patient refused    Relationship status: Patient refused   Intimate partner violence    Fear of current or ex partner: Patient refused    Emotionally abused: Patient refused    Physically abused: Patient refused    Forced sexual activity: Patient refused  Other  Topics Concern   Not on file  Social History Narrative   Retired. Widowed. Regularly exercises.     Family History  Problem Relation Age of Onset   Heart disease Father    Rheum arthritis Father    Allergies Father    Liver cancer Mother    Pancreatic cancer Mother    Hypertension Sister    Rheum arthritis Sister    Breast cancer Maternal Aunt      Current Outpatient Medications:    acetaminophen (TYLENOL) 500 MG tablet, Take 1,000 mg by mouth every 6 (six) hours as needed for moderate pain. , Disp: , Rfl:    allopurinol (ZYLOPRIM) 100 MG tablet, Take 200 mg by mouth daily., Disp: , Rfl:    amiodarone (PACERONE) 200 MG tablet, Take 100 mg by mouth 2 (two) times daily., Disp: , Rfl:    amLODipine (NORVASC) 10 MG tablet, TAKE 1 TABLET (10 MG TOTAL) BY MOUTH DAILY., Disp: 90 tablet, Rfl: 0   atorvastatin (LIPITOR) 20 MG tablet, TAKE 1 TABLET (20 MG TOTAL) BY MOUTH DAILY. (Patient taking differently: Take 20 mg by mouth at bedtime. ), Disp: 90 tablet, Rfl: 0   Brinzolamide-Brimonidine (SIMBRINZA) 1-0.2 % SUSP, Place 1 drop into both eyes 2 (two) times daily., Disp: 1 Bottle, Rfl: 1   carvedilol (COREG) 25 MG tablet, Take 25-50 mg by mouth See admin instructions. Take 1 tablet (25MG) by mouth every morning and 2 tablets (50MG) by mouth every night before bed, Disp: , Rfl:    diclofenac sodium (VOLTAREN) 1 % GEL, Apply 2 g topically 4 (four) times daily., Disp: , Rfl:    ferrous sulfate 325 (65 FE) MG EC tablet, Take 325 mg by mouth 2 (two) times daily with a meal. , Disp: , Rfl:    furosemide (LASIX) 20 MG tablet, Take 1 tablet (20 mg total) by mouth daily., Disp: 90 tablet, Rfl: 0   glipiZIDE (GLUCOTROL) 10 MG tablet, TAKE 1 TABLET (10 MG TOTAL) BY MOUTH 2 (TWO) TIMES DAILY BEFORE A MEAL., Disp: 180 tablet, Rfl: 0   Glycerin, Laxative, (FLEET LIQUID GLYCERIN SUPP RE), Place 1 Dose rectally daily as needed (constipation)., Disp: , Rfl:    KLOR-CON M10 10 MEQ tablet,  Take 1 tablet by mouth daily., Disp: , Rfl: 3   mometasone (ELOCON) 0.1 % cream, Apply topically daily. Apply to affected area as directed two times a day (Patient taking differently: Apply topically daily. Apply to affected area as directed two times a day as needed), Disp: 45  g, Rfl: 0   Multiple Vitamins-Minerals (CENTRUM SILVER PO), Take 1 tablet by mouth daily. , Disp: , Rfl:    olmesartan (BENICAR) 40 MG tablet, TAKE 1 TABLET (40 MG TOTAL) BY MOUTH DAILY., Disp: 90 tablet, Rfl: 0   polyethylene glycol (MIRALAX / GLYCOLAX) packet, Take 17 g by mouth daily as needed for moderate constipation., Disp: , Rfl:    TRADJENTA 5 MG TABS tablet, TAKE 1 TABLET (5 MG TOTAL) BY MOUTH DAILY. (Patient taking differently: TAKE 1 TABLET (5 MG TOTAL) BY MOUTH DAILY WITH BREAKFAST), Disp: 30 tablet, Rfl: 2   travoprost, benzalkonium, (TRAVATAN) 0.004 % ophthalmic solution, Place 1 drop into both eyes at bedtime. , Disp: , Rfl:    warfarin (COUMADIN) 3 MG tablet, TAKE 1-1.5 TABLETS DAILY AT 6 PM. TAKE 1 TAB ON TUES, THURS, SAT, SUN. TAKE 4.5 MG ON MON, WED, FRI, Disp: 100 tablet, Rfl: 3  Physical exam:  Vitals:   10/16/19 0924  BP: 110/66  Pulse: 63  Temp: (!) 96.9 F (36.1 C)  TempSrc: Tympanic  Weight: 243 lb (110.2 kg)   Physical Exam Constitutional:      General: She is not in acute distress. HENT:     Head: Normocephalic and atraumatic.  Eyes:     Pupils: Pupils are equal, round, and reactive to light.  Neck:     Musculoskeletal: Normal range of motion.  Cardiovascular:     Rate and Rhythm: Normal rate and regular rhythm.     Heart sounds: Normal heart sounds.  Pulmonary:     Effort: Pulmonary effort is normal.     Breath sounds: Normal breath sounds.  Abdominal:     General: Bowel sounds are normal.     Palpations: Abdomen is soft.  Skin:    General: Skin is warm and dry.  Neurological:     Mental Status: She is alert and oriented to person, place, and time.      CMP Latest  Ref Rng & Units 10/16/2019  Glucose 70 - 99 mg/dL 217(H)  BUN 8 - 23 mg/dL 30(H)  Creatinine 0.44 - 1.00 mg/dL 3.08(H)  Sodium 135 - 145 mmol/L 137  Potassium 3.5 - 5.1 mmol/L 4.0  Chloride 98 - 111 mmol/L 105  CO2 22 - 32 mmol/L 25  Calcium 8.9 - 10.3 mg/dL 9.3  Total Protein 6.5 - 8.1 g/dL 6.5  Total Bilirubin 0.3 - 1.2 mg/dL 0.6  Alkaline Phos 38 - 126 U/L 72  AST 15 - 41 U/L 24  ALT 0 - 44 U/L 27   CBC Latest Ref Rng & Units 10/16/2019  WBC 4.0 - 10.5 K/uL 5.2  Hemoglobin 12.0 - 15.0 g/dL 9.1(L)  Hematocrit 36.0 - 46.0 % 27.1(L)  Platelets 150 - 400 K/uL 133(L)     Assessment and plan- Patient is a 84 y.o. female with chronic anemia multifactorial secondary to anemia of chronic kidney disease as well as iron deficiency.  She is here for routine follow-up  Patient's hemoglobin is slowly drifting down from 10-11 last year to 9.1.  Her iron studies are within normal limits and her iron saturation is 22%.  B12 levels are also normal.Suspect that her anemia is currently mainly due to her chronic kidney disease.  I also noticed that her creatinine is worse today at the from a prior value of 1.9 and her GFR has declined down to 15.  I will inform Dr. Holley Raring about her GFR.  Discussed the patient would benefit from EPO given her  worsening anemia of chronic kidney disease.  Discussed risks and benefits of EPO including all but not limited to risk of heart attack strokes and thromboembolic events.  Patient understands and agrees to proceed.  Plan to give her Retacrit 40,000 units every 3 weeks with a goal to maintain hemoglobin between 10-11.  I will see her back in 3 months with labs   Visit Diagnosis 1. Anemia of chronic kidney failure, unspecified stage   2. Iron deficiency anemia, unspecified iron deficiency anemia type      Dr. Randa Evens, MD, MPH [facility] [phone] 10/17/2019 8:11 AM

## 2019-10-22 ENCOUNTER — Other Ambulatory Visit: Payer: Self-pay

## 2019-10-23 ENCOUNTER — Inpatient Hospital Stay: Payer: Medicare HMO

## 2019-10-23 ENCOUNTER — Other Ambulatory Visit: Payer: Self-pay

## 2019-10-23 VITALS — BP 110/72

## 2019-10-23 DIAGNOSIS — N183 Chronic kidney disease, stage 3 unspecified: Secondary | ICD-10-CM | POA: Diagnosis not present

## 2019-10-23 DIAGNOSIS — D509 Iron deficiency anemia, unspecified: Secondary | ICD-10-CM

## 2019-10-23 MED ORDER — EPOETIN ALFA-EPBX 40000 UNIT/ML IJ SOLN
40000.0000 [IU] | INTRAMUSCULAR | Status: AC
  Administered 2019-10-23: 40000 [IU] via SUBCUTANEOUS
  Filled 2019-10-23: qty 1

## 2019-11-12 ENCOUNTER — Other Ambulatory Visit: Payer: Self-pay | Admitting: *Deleted

## 2019-11-12 DIAGNOSIS — D509 Iron deficiency anemia, unspecified: Secondary | ICD-10-CM

## 2019-11-12 DIAGNOSIS — N189 Chronic kidney disease, unspecified: Secondary | ICD-10-CM

## 2019-11-12 DIAGNOSIS — D631 Anemia in chronic kidney disease: Secondary | ICD-10-CM

## 2019-11-13 ENCOUNTER — Inpatient Hospital Stay: Payer: Medicare HMO

## 2019-11-13 ENCOUNTER — Other Ambulatory Visit: Payer: Self-pay

## 2019-11-13 ENCOUNTER — Inpatient Hospital Stay: Payer: Medicare HMO | Attending: Oncology

## 2019-11-13 DIAGNOSIS — N189 Chronic kidney disease, unspecified: Secondary | ICD-10-CM

## 2019-11-13 DIAGNOSIS — D509 Iron deficiency anemia, unspecified: Secondary | ICD-10-CM

## 2019-11-13 DIAGNOSIS — D631 Anemia in chronic kidney disease: Secondary | ICD-10-CM

## 2019-11-13 LAB — CBC
HCT: 32.2 % — ABNORMAL LOW (ref 36.0–46.0)
Hemoglobin: 10.5 g/dL — ABNORMAL LOW (ref 12.0–15.0)
MCH: 26.7 pg (ref 26.0–34.0)
MCHC: 32.6 g/dL (ref 30.0–36.0)
MCV: 81.9 fL (ref 80.0–100.0)
Platelets: 143 10*3/uL — ABNORMAL LOW (ref 150–400)
RBC: 3.93 MIL/uL (ref 3.87–5.11)
RDW: 15.3 % (ref 11.5–15.5)
WBC: 5.8 10*3/uL (ref 4.0–10.5)
nRBC: 0 % (ref 0.0–0.2)

## 2019-12-04 ENCOUNTER — Inpatient Hospital Stay: Payer: Medicare HMO

## 2019-12-04 ENCOUNTER — Other Ambulatory Visit: Payer: Self-pay

## 2019-12-04 DIAGNOSIS — N189 Chronic kidney disease, unspecified: Secondary | ICD-10-CM

## 2019-12-04 DIAGNOSIS — D509 Iron deficiency anemia, unspecified: Secondary | ICD-10-CM

## 2019-12-04 DIAGNOSIS — D631 Anemia in chronic kidney disease: Secondary | ICD-10-CM

## 2019-12-04 LAB — CBC
HCT: 30.6 % — ABNORMAL LOW (ref 36.0–46.0)
Hemoglobin: 10 g/dL — ABNORMAL LOW (ref 12.0–15.0)
MCH: 26.8 pg (ref 26.0–34.0)
MCHC: 32.7 g/dL (ref 30.0–36.0)
MCV: 82 fL (ref 80.0–100.0)
Platelets: 129 10*3/uL — ABNORMAL LOW (ref 150–400)
RBC: 3.73 MIL/uL — ABNORMAL LOW (ref 3.87–5.11)
RDW: 15 % (ref 11.5–15.5)
WBC: 6.7 10*3/uL (ref 4.0–10.5)
nRBC: 0 % (ref 0.0–0.2)

## 2019-12-16 ENCOUNTER — Other Ambulatory Visit: Payer: Self-pay | Admitting: Family Medicine

## 2019-12-24 ENCOUNTER — Other Ambulatory Visit: Payer: Self-pay

## 2019-12-25 ENCOUNTER — Other Ambulatory Visit: Payer: Self-pay

## 2019-12-25 ENCOUNTER — Inpatient Hospital Stay: Payer: Medicare HMO | Attending: Oncology

## 2019-12-25 ENCOUNTER — Inpatient Hospital Stay: Payer: Medicare HMO

## 2019-12-25 DIAGNOSIS — D631 Anemia in chronic kidney disease: Secondary | ICD-10-CM | POA: Diagnosis present

## 2019-12-25 DIAGNOSIS — D509 Iron deficiency anemia, unspecified: Secondary | ICD-10-CM

## 2019-12-25 DIAGNOSIS — N189 Chronic kidney disease, unspecified: Secondary | ICD-10-CM | POA: Insufficient documentation

## 2019-12-25 LAB — COMPREHENSIVE METABOLIC PANEL
ALT: 20 U/L (ref 0–44)
AST: 23 U/L (ref 15–41)
Albumin: 4.1 g/dL (ref 3.5–5.0)
Alkaline Phosphatase: 92 U/L (ref 38–126)
Anion gap: 10 (ref 5–15)
BUN: 34 mg/dL — ABNORMAL HIGH (ref 8–23)
CO2: 26 mmol/L (ref 22–32)
Calcium: 9.7 mg/dL (ref 8.9–10.3)
Chloride: 105 mmol/L (ref 98–111)
Creatinine, Ser: 2.94 mg/dL — ABNORMAL HIGH (ref 0.44–1.00)
GFR calc Af Amer: 16 mL/min — ABNORMAL LOW (ref >60)
GFR calc non Af Amer: 14 mL/min — ABNORMAL LOW (ref >60)
Glucose, Bld: 121 mg/dL — ABNORMAL HIGH (ref 70–99)
Potassium: 4.2 mmol/L (ref 3.5–5.1)
Sodium: 141 mmol/L (ref 135–145)
Total Bilirubin: 0.8 mg/dL (ref 0.3–1.2)
Total Protein: 7.1 g/dL (ref 6.5–8.1)

## 2019-12-25 LAB — IRON AND TIBC
Iron: 60 ug/dL (ref 28–170)
Saturation Ratios: 21 % (ref 10.4–31.8)
TIBC: 284 ug/dL (ref 250–450)
UIBC: 224 ug/dL

## 2019-12-25 LAB — CBC WITH DIFFERENTIAL/PLATELET
Abs Immature Granulocytes: 0.03 10*3/uL (ref 0.00–0.07)
Basophils Absolute: 0 10*3/uL (ref 0.0–0.1)
Basophils Relative: 0 %
Eosinophils Absolute: 0.2 10*3/uL (ref 0.0–0.5)
Eosinophils Relative: 2 %
HCT: 33 % — ABNORMAL LOW (ref 36.0–46.0)
Hemoglobin: 10.8 g/dL — ABNORMAL LOW (ref 12.0–15.0)
Immature Granulocytes: 1 %
Lymphocytes Relative: 23 %
Lymphs Abs: 1.5 10*3/uL (ref 0.7–4.0)
MCH: 26.7 pg (ref 26.0–34.0)
MCHC: 32.7 g/dL (ref 30.0–36.0)
MCV: 81.5 fL (ref 80.0–100.0)
Monocytes Absolute: 0.5 10*3/uL (ref 0.1–1.0)
Monocytes Relative: 7 %
Neutro Abs: 4.2 10*3/uL (ref 1.7–7.7)
Neutrophils Relative %: 67 %
Platelets: 143 10*3/uL — ABNORMAL LOW (ref 150–400)
RBC: 4.05 MIL/uL (ref 3.87–5.11)
RDW: 14.6 % (ref 11.5–15.5)
WBC: 6.3 10*3/uL (ref 4.0–10.5)
nRBC: 0 % (ref 0.0–0.2)

## 2019-12-25 LAB — FERRITIN: Ferritin: 111 ng/mL (ref 11–307)

## 2020-01-14 ENCOUNTER — Other Ambulatory Visit: Payer: Self-pay

## 2020-01-15 ENCOUNTER — Other Ambulatory Visit: Payer: Self-pay

## 2020-01-15 ENCOUNTER — Inpatient Hospital Stay: Payer: Medicare HMO

## 2020-01-15 ENCOUNTER — Inpatient Hospital Stay: Payer: Medicare HMO | Attending: Oncology

## 2020-01-15 DIAGNOSIS — N189 Chronic kidney disease, unspecified: Secondary | ICD-10-CM

## 2020-01-15 DIAGNOSIS — D631 Anemia in chronic kidney disease: Secondary | ICD-10-CM | POA: Insufficient documentation

## 2020-01-15 DIAGNOSIS — D509 Iron deficiency anemia, unspecified: Secondary | ICD-10-CM

## 2020-01-15 LAB — CBC
HCT: 31.2 % — ABNORMAL LOW (ref 36.0–46.0)
Hemoglobin: 10.1 g/dL — ABNORMAL LOW (ref 12.0–15.0)
MCH: 26.1 pg (ref 26.0–34.0)
MCHC: 32.4 g/dL (ref 30.0–36.0)
MCV: 80.6 fL (ref 80.0–100.0)
Platelets: 140 10*3/uL — ABNORMAL LOW (ref 150–400)
RBC: 3.87 MIL/uL (ref 3.87–5.11)
RDW: 14.9 % (ref 11.5–15.5)
WBC: 5.9 10*3/uL (ref 4.0–10.5)
nRBC: 0 % (ref 0.0–0.2)

## 2020-01-16 ENCOUNTER — Ambulatory Visit: Payer: Medicare HMO | Attending: Internal Medicine

## 2020-01-16 DIAGNOSIS — Z23 Encounter for immunization: Secondary | ICD-10-CM | POA: Insufficient documentation

## 2020-01-16 NOTE — Progress Notes (Signed)
   Covid-19 Vaccination Clinic  Name:  [name] [name]    MRN: [id number] DOB: [birth year]  01/16/2020  [name] was observed post Covid-19 immunization for 30 minutes based on pre-vaccination screening without incidence. She was provided with Vaccine Information Sheet and instruction to access the V-Safe system.   Ms. Such was instructed to call 911 with any severe reactions post vaccine: Marland Kitchen Difficulty breathing  . Swelling of your face and throat  . A fast heartbeat  . A bad rash all over your body  . Dizziness and weakness    Immunizations Administered    Name Date Dose VIS Date Route   Pfizer COVID-19 Vaccine 01/16/2020 10:12 AM 0.3 mL 11/14/2019 Intramuscular   Manufacturer: [place]   Lot: [id number]   [place]: [id number]

## 2020-02-05 ENCOUNTER — Inpatient Hospital Stay: Payer: Medicare HMO | Admitting: Oncology

## 2020-02-05 ENCOUNTER — Inpatient Hospital Stay: Payer: Medicare HMO

## 2020-02-06 ENCOUNTER — Inpatient Hospital Stay: Payer: Medicare HMO

## 2020-02-06 ENCOUNTER — Encounter: Payer: Self-pay | Admitting: Oncology

## 2020-02-06 ENCOUNTER — Inpatient Hospital Stay (HOSPITAL_BASED_OUTPATIENT_CLINIC_OR_DEPARTMENT_OTHER): Payer: Medicare HMO | Admitting: Oncology

## 2020-02-06 ENCOUNTER — Other Ambulatory Visit: Payer: Self-pay

## 2020-02-06 ENCOUNTER — Inpatient Hospital Stay: Payer: Medicare HMO | Attending: Oncology

## 2020-02-06 VITALS — BP 122/51 | HR 61 | Temp 98.2°F | Resp 16 | Wt 243.8 lb

## 2020-02-06 DIAGNOSIS — N183 Chronic kidney disease, stage 3 unspecified: Secondary | ICD-10-CM | POA: Insufficient documentation

## 2020-02-06 DIAGNOSIS — D631 Anemia in chronic kidney disease: Secondary | ICD-10-CM

## 2020-02-06 DIAGNOSIS — N189 Chronic kidney disease, unspecified: Secondary | ICD-10-CM | POA: Diagnosis not present

## 2020-02-06 DIAGNOSIS — D509 Iron deficiency anemia, unspecified: Secondary | ICD-10-CM

## 2020-02-06 DIAGNOSIS — D649 Anemia, unspecified: Secondary | ICD-10-CM | POA: Diagnosis not present

## 2020-02-06 DIAGNOSIS — Z79899 Other long term (current) drug therapy: Secondary | ICD-10-CM | POA: Diagnosis not present

## 2020-02-06 DIAGNOSIS — I130 Hypertensive heart and chronic kidney disease with heart failure and stage 1 through stage 4 chronic kidney disease, or unspecified chronic kidney disease: Secondary | ICD-10-CM | POA: Insufficient documentation

## 2020-02-06 DIAGNOSIS — E1122 Type 2 diabetes mellitus with diabetic chronic kidney disease: Secondary | ICD-10-CM | POA: Insufficient documentation

## 2020-02-06 DIAGNOSIS — E785 Hyperlipidemia, unspecified: Secondary | ICD-10-CM | POA: Insufficient documentation

## 2020-02-06 LAB — COMPREHENSIVE METABOLIC PANEL
ALT: 18 U/L (ref 0–44)
AST: 22 U/L (ref 15–41)
Albumin: 3.8 g/dL (ref 3.5–5.0)
Alkaline Phosphatase: 76 U/L (ref 38–126)
Anion gap: 10 (ref 5–15)
BUN: 33 mg/dL — ABNORMAL HIGH (ref 8–23)
CO2: 24 mmol/L (ref 22–32)
Calcium: 9.3 mg/dL (ref 8.9–10.3)
Chloride: 106 mmol/L (ref 98–111)
Creatinine, Ser: 2.89 mg/dL — ABNORMAL HIGH (ref 0.44–1.00)
GFR calc Af Amer: 17 mL/min — ABNORMAL LOW (ref >60)
GFR calc non Af Amer: 15 mL/min — ABNORMAL LOW (ref >60)
Glucose, Bld: 182 mg/dL — ABNORMAL HIGH (ref 70–99)
Potassium: 4.2 mmol/L (ref 3.5–5.1)
Sodium: 140 mmol/L (ref 135–145)
Total Bilirubin: 0.7 mg/dL (ref 0.3–1.2)
Total Protein: 6.4 g/dL — ABNORMAL LOW (ref 6.5–8.1)

## 2020-02-06 LAB — IRON AND TIBC
Iron: 77 ug/dL (ref 28–170)
Saturation Ratios: 30 % (ref 10.4–31.8)
TIBC: 260 ug/dL (ref 250–450)
UIBC: 183 ug/dL

## 2020-02-06 LAB — CBC WITH DIFFERENTIAL/PLATELET
Abs Immature Granulocytes: 0.01 10*3/uL (ref 0.00–0.07)
Basophils Absolute: 0 10*3/uL (ref 0.0–0.1)
Basophils Relative: 0 %
Eosinophils Absolute: 0.1 10*3/uL (ref 0.0–0.5)
Eosinophils Relative: 2 %
HCT: 29.8 % — ABNORMAL LOW (ref 36.0–46.0)
Hemoglobin: 9.9 g/dL — ABNORMAL LOW (ref 12.0–15.0)
Immature Granulocytes: 0 %
Lymphocytes Relative: 22 %
Lymphs Abs: 1.2 10*3/uL (ref 0.7–4.0)
MCH: 26.8 pg (ref 26.0–34.0)
MCHC: 33.2 g/dL (ref 30.0–36.0)
MCV: 80.8 fL (ref 80.0–100.0)
Monocytes Absolute: 0.4 10*3/uL (ref 0.1–1.0)
Monocytes Relative: 7 %
Neutro Abs: 3.7 10*3/uL (ref 1.7–7.7)
Neutrophils Relative %: 69 %
Platelets: 136 10*3/uL — ABNORMAL LOW (ref 150–400)
RBC: 3.69 MIL/uL — ABNORMAL LOW (ref 3.87–5.11)
RDW: 15.3 % (ref 11.5–15.5)
WBC: 5.4 10*3/uL (ref 4.0–10.5)
nRBC: 0 % (ref 0.0–0.2)

## 2020-02-06 LAB — FERRITIN: Ferritin: 109 ng/mL (ref 11–307)

## 2020-02-06 MED ORDER — EPOETIN ALFA-EPBX 40000 UNIT/ML IJ SOLN
40000.0000 [IU] | INTRAMUSCULAR | Status: AC
  Administered 2020-02-06: 40000 [IU] via SUBCUTANEOUS
  Filled 2020-02-06: qty 1

## 2020-02-06 NOTE — Progress Notes (Signed)
Pt wanted to know how the shot is suppose to help her. The last few times she has not needed one. I went over how the shot helps make hgb. The more hgb better to give oxygen to organs and hopefully pt will feel better. She says she feels tired today. She has been working on diet with less sodium and less sugar in food. She was told to walk and she has places to walk at her apt. But needs to make herself do it

## 2020-02-09 NOTE — Patient Instructions (Signed)
limits

## 2020-02-09 NOTE — Progress Notes (Signed)
Hematology/Oncology Consult note [facility]  Telephone:([phone] [phone]  Patient Care Team: Dion Body, MD as PCP - General (Family Medicine) Minna Merritts, MD as Consulting Physician (Cardiology) Anthonette Legato, MD as Consulting Physician (Nephrology) Ulatowski Lance, MD as Consulting Physician (Cardiology) Lorelee Cover., MD (Ophthalmology) Edrick Kins, DPM as Consulting Physician (Podiatry)   Name of the patient: [name] [name]  [id number]  01-Jun-1935   Date of visit: 02/09/20  Diagnosis- chronic anemia multifactorial secondary to anemia of chronic kidney disease and a component of iron deficiency  Chief complaint/ Reason for visit-routine follow-up of anemia  Heme/Onc history: patient is a85 year old female with a past medical history significant for hypertension and stage III chronic kidney disease among other medical problems. She also has hypertension hyperlipidemia and type 2 diabetes. She was recently found by Dr. Clayton Bibles and was noted to have a hemoglobin of 9.4. Iron studies also revealed a ferritin of 22, iron saturation 13%. TIBC was normal at 302 on serum iron was low normal at 39 white count and platelets within normal limits. She has been referred to [place] for iron deficiency anemia. She has been seen by Dr. Remi Haggard GI the past and had a colonoscopy in 2017 which did not reveal any evidence of malignancy. 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 Grayland Ormond in September 2017  Patient is taking oral iron once a day for over a month now and tolerating it without any significant side effects. She denies any consistent use of NSAIDs. Denies any bleeding and stools are docked artery stools. Denies any family history of colon cancer  Blood work from 05/24/2017 was as follows: CBC showed white count of  7.8, H&H of 10.2/30.2 with an MCV of 68.1 and a platelet count of 199. Ferritin levels were low at 14. Vitamin B12 is worse normal at 947. Folate was normal. Haptoglobin and TSH was normal. Iron studies were within normal limits. ESR was mildly elevated at 56. Multiple myeloma panel did not reveal any monoclonal protein. H. pylori stool antigen was negative.  Patient does have CKD and sees nephrology  Interval history-overall patient is doing well and denies any new complaints at this time.  She has chronic fatigue which is remained stable.  ECOG PS- 1 Pain scale- 0   Review of systems- Review of Systems  Constitutional: Positive for malaise/fatigue. Negative for chills, fever and weight loss.  HENT: Negative for congestion, ear discharge and nosebleeds.   Eyes: Negative for blurred vision.  Respiratory: Negative for cough, hemoptysis, sputum production, shortness of breath and wheezing.   Cardiovascular: Negative for chest pain, palpitations, orthopnea and claudication.  Gastrointestinal: Negative for abdominal pain, blood in stool, constipation, diarrhea, heartburn, melena, nausea and vomiting.  Genitourinary: Negative for dysuria, flank pain, frequency, hematuria and urgency.  Musculoskeletal: Negative for back pain, joint pain and myalgias.  Skin: Negative for rash.  Neurological: Negative for dizziness, tingling, focal weakness, seizures, weakness and headaches.  Endo/Heme/Allergies: Does not bruise/bleed easily.  Psychiatric/Behavioral: Negative for depression and suicidal ideas. The patient does not have insomnia.       Allergies  Allergen Reactions  . Metoprolol Other (See Comments)    Bad dreams Patient stated that she had suicidal thoughts while taking this medication. Other reaction(s): Other (See Comments) Other reaction(s): Other (See Comments) Bad dreams Patient stated that she had suicidal thoughts while taking  this medication. Bad dreams Patient stated that she  had suicidal thoughts while taking this medication. Bad dreams Patient stated that she had suicidal thoughts while taking this medication. Other reaction(s): Other (See Comments) Bad dreams   . Diltiazem Other (See Comments)    Patient stated that she had bad dreams with this medication Other reaction(s): Other (See Comments) Patient stated that she had bad dreams with this medication Other reaction(s): Other (See Comments) Patient stated that she had bad dreams with this medication Patient stated that she had bad dreams with this medication Patient stated that she had bad dreams with this medication  . Levofloxacin Nausea And Vomiting and Nausea Only    Other reaction(s): Nausea And Vomiting     Past Medical History:  Diagnosis Date  . 1st degree AV block    a. PR 400 msec with Apacing  . Anemia   . Anxiety   . Arthritis    a. shoulders.  . Chest pain    a. 2004 Cath:  reportedly nl;  b. 09/2013 Neg MV.  . Chronic diastolic heart failure ([place])    a. 09/2013 Echo: EF 50-55%, mild LVH, mild to mod MR/TR.  Marland Kitchen Chronic kidney disease   . Congestive heart disease ([place])   . Esophageal reflux   . H/O hiatal hernia   . History of UTI   . Hyperlipidemia   . Morbid obesity ([place])   . OSA on CPAP   . PAF (paroxysmal atrial fibrillation) ([place])   . SSS (sick sinus syndrome) ([place])    a. 2008 s/p MDT PPM;  b. 11/2013 Gen change- MDT ADDRL1 Adapta DC PPM, ser # [id number] H.  . Symptomatic PVCs   . Syncope and collapse    a. Felt to be vasovagal.  . Thyroid disease    on no meds per pt  . Type II diabetes mellitus ([place])   . Unspecified essential hypertension   . Unspecified glaucoma(365.9)      Past Surgical History:  Procedure Laterality Date  . APPENDECTOMY    . CARDIOVERSION N/A 04/26/2017   Procedure: Cardioversion;  Surgeon: Minna Merritts, MD;  Location: [facility] ORS;  Service: Cardiovascular;  Laterality: N/A;  . DILATION AND CURETTAGE OF UTERUS    . INCISION AND DRAINAGE  OF WOUND Right 03/1998   "leg; it was like a boil" (12/01/2013)  . INSERT / REPLACE / REMOVE PACEMAKER  2008; 12/01/2013   MDT ADDRL1 pacemaker - gen change by Dr Caryl Comes 12/01/2013  . LUNG BIOPSY  2010   [facility]  . PERMANENT PACEMAKER GENERATOR CHANGE N/A 12/01/2013   Procedure: PERMANENT PACEMAKER GENERATOR CHANGE;  Surgeon: Deboraha Sprang, MD;  Location: [facility] CATH LAB;  Service: Cardiovascular;  Laterality: N/A;  . VAGINAL HYSTERECTOMY  1970    Social History   Socioeconomic History  . Marital status: Widowed    Spouse name: Not on file  . Number of children: Y  . Years of education: Not on file  . Highest education level: Not on file  Occupational History  . Occupation: retired  Tobacco Use  . Smoking status: Former Smoker    Packs/day: 0.50    Years: 34.00    Pack years: 17.00    Types: Cigarettes    Quit date: 08/15/1989    Years since quitting: 30.5  . Smokeless tobacco: Never Used  Substance and Sexual Activity  . Alcohol use: No    Alcohol/week: 0.0 standard drinks  . Drug use: No  . Sexual activity: Never  Other Topics Concern  . Not on file  Social History Narrative   Retired. Widowed. Regularly exercises.    Social Determinants of Health   Financial Resource Strain:   . Difficulty of Paying Living Expenses: Not on file  Food Insecurity:   . Worried About Charity fundraiser in the Last Year: Not on file  . Ran Out of Food in the Last Year: Not on file  Transportation Needs:   . Lack of Transportation (Medical): Not on file  . Lack of Transportation (Non-Medical): Not on file  Physical Activity:   . Days of Exercise per Week: Not on file  . Minutes of Exercise per Session: Not on file  Stress:   . Feeling of Stress : Not on file  Social Connections:   . Frequency of Communication with Friends and Family: Not on file  . Frequency of Social Gatherings with Friends and Family: Not on file  . Attends Religious Services: Not on file  . Active Member of Clubs or  Organizations: Not on file  . Attends Archivist Meetings: Not on file  . Marital Status: Not on file  Intimate Partner Violence:   . Fear of Current or Ex-Partner: Not on file  . Emotionally Abused: Not on file  . Physically Abused: Not on file  . Sexually Abused: Not on file    Family History  Problem Relation Age of Onset  . Heart disease Father   . Rheum arthritis Father   . Allergies Father   . Liver cancer Mother   . Pancreatic cancer Mother   . Hypertension Sister   . Rheum arthritis Sister   . Breast cancer Maternal Aunt      Current Outpatient Medications:  .  acetaminophen (TYLENOL) 500 MG tablet, Take 1,000 mg by mouth every 6 (six) hours as needed for moderate pain. , Disp: , Rfl:  .  allopurinol (ZYLOPRIM) 100 MG tablet, Take 200 mg by mouth daily., Disp: , Rfl:  .  amiodarone (PACERONE) 200 MG tablet, Take 100 mg by mouth 2 (two) times daily., Disp: , Rfl:  .  amLODipine (NORVASC) 10 MG tablet, TAKE 1 TABLET (10 MG TOTAL) BY MOUTH DAILY., Disp: 90 tablet, Rfl: 0 .  atorvastatin (LIPITOR) 20 MG tablet, TAKE 1 TABLET (20 MG TOTAL) BY MOUTH DAILY. (Patient taking differently: Take 20 mg by mouth at bedtime. ), Disp: 90 tablet, Rfl: 0 .  Brinzolamide-Brimonidine (SIMBRINZA) 1-0.2 % SUSP, Place 1 drop into both eyes 2 (two) times daily., Disp: 1 Bottle, Rfl: 1 .  carvedilol (COREG) 25 MG tablet, Take 25-50 mg by mouth See admin instructions. Take 1 tablet (25MG) by mouth every morning and 2 tablets (50MG) by mouth every night before bed, Disp: , Rfl:  .  ferrous sulfate 325 (65 FE) MG EC tablet, Take 325 mg by mouth 2 (two) times daily with a meal. , Disp: , Rfl:  .  furosemide (LASIX) 20 MG tablet, Take 1 tablet (20 mg total) by mouth daily., Disp: 90 tablet, Rfl: 0 .  glipiZIDE (GLUCOTROL) 10 MG tablet, TAKE 1 TABLET (10 MG TOTAL) BY MOUTH 2 (TWO) TIMES DAILY BEFORE A MEAL., Disp: 180 tablet, Rfl: 0 .  Glycerin, Laxative, (FLEET LIQUID GLYCERIN SUPP RE), Place 1  Dose rectally daily as needed (constipation)., Disp: , Rfl:  .  KLOR-CON M10 10 MEQ tablet, Take 1 tablet by mouth daily., Disp: , Rfl: 3 .  mometasone (ELOCON) 0.1 % cream, Apply topically daily. Apply to  affected area as directed two times a day (Patient taking differently: Apply topically daily. Apply to affected area as directed two times a day as needed), Disp: 45 g, Rfl: 0 .  Multiple Vitamins-Minerals (CENTRUM SILVER PO), Take 1 tablet by mouth daily. , Disp: , Rfl:  .  olmesartan (BENICAR) 40 MG tablet, TAKE 1 TABLET (40 MG TOTAL) BY MOUTH DAILY., Disp: 90 tablet, Rfl: 0 .  polyethylene glycol (MIRALAX / GLYCOLAX) packet, Take 17 g by mouth daily as needed for moderate constipation., Disp: , Rfl:  .  TRADJENTA 5 MG TABS tablet, TAKE 1 TABLET (5 MG TOTAL) BY MOUTH DAILY. (Patient taking differently: TAKE 1 TABLET (5 MG TOTAL) BY MOUTH DAILY WITH BREAKFAST), Disp: 30 tablet, Rfl: 2 .  travoprost, benzalkonium, (TRAVATAN) 0.004 % ophthalmic solution, Place 1 drop into both eyes at bedtime. , Disp: , Rfl:  .  warfarin (COUMADIN) 3 MG tablet, TAKE 1-1.5 TABLETS DAILY AT 6 PM. TAKE 1 TAB ON TUES, THURS, SAT, SUN. TAKE 4.5 MG ON MON, WED, FRI, Disp: 100 tablet, Rfl: 3 .  diclofenac sodium (VOLTAREN) 1 % GEL, Apply 2 g topically 4 (four) times daily., Disp: , Rfl:  No current facility-administered medications for this visit.  Facility-Administered Medications Ordered in Other Visits:  .  epoetin alfa-epbx (RETACRIT) injection 40,000 Units, 40,000 Units, Subcutaneous, Weekly, Sindy Guadeloupe, MD, 40,000 Units at 02/06/20 1201  Physical exam:  Vitals:   02/06/20 1127  BP: (!) 122/51  Pulse: 61  Resp: 16  Temp: 98.2 F (36.8 C)  TempSrc: Tympanic  Weight: 243 lb 12.8 oz (110.6 kg)   Physical Exam Cardiovascular:     Rate and Rhythm: Normal rate and regular rhythm.     Heart sounds: Normal heart sounds.  Pulmonary:     Effort: Pulmonary effort is normal.     Breath sounds: Normal breath  sounds.  Abdominal:     General: Bowel sounds are normal.     Palpations: Abdomen is soft.  Skin:    General: Skin is warm and dry.  Neurological:     Mental Status: She is alert and oriented to person, place, and time.      CMP Latest Ref Rng & Units 02/06/2020  Glucose 70 - 99 mg/dL 182(H)  BUN 8 - 23 mg/dL 33(H)  Creatinine 0.44 - 1.00 mg/dL 2.89(H)  Sodium 135 - 145 mmol/L 140  Potassium 3.5 - 5.1 mmol/L 4.2  Chloride 98 - 111 mmol/L 106  CO2 22 - 32 mmol/L 24  Calcium 8.9 - 10.3 mg/dL 9.3  Total Protein 6.5 - 8.1 g/dL 6.4(L)  Total Bilirubin 0.3 - 1.2 mg/dL 0.7  Alkaline Phos 38 - 126 U/L 76  AST 15 - 41 U/L 22  ALT 0 - 44 U/L 18   CBC Latest Ref Rng & Units 02/06/2020  WBC 4.0 - 10.5 K/uL 5.4  Hemoglobin 12.0 - 15.0 g/dL 9.9(L)  Hematocrit 36.0 - 46.0 % 29.8(L)  Platelets 150 - 400 K/uL 136(L)     Assessment and plan- Patient is a 85 y.o. female with anemia of chronic kidney disease here for routine follow-up  Patient's hemoglobin is 9.9 and she will proceed with her next dose of Retacrit today.  She will continue to receive it every 3 weeks and we will hold it if her hemoglobin is less than 11.  Iron studies are within normal limits.  I will see her back in 12 weeks for possible Retacrit and repeat CBC ferritin and iron  studies on that day   Visit Diagnosis 1. Anemia of chronic kidney failure, unspecified stage   2. Erythropoietin (EPO) stimulating agent anemia management patient      Dr. Randa Evens, MD, MPH [facility] [phone] 02/09/2020 8:46 AM

## 2020-02-10 ENCOUNTER — Ambulatory Visit: Payer: Medicare HMO | Attending: Internal Medicine

## 2020-02-10 DIAGNOSIS — Z23 Encounter for immunization: Secondary | ICD-10-CM | POA: Insufficient documentation

## 2020-02-10 NOTE — Progress Notes (Signed)
   Covid-19 Vaccination Clinic  Name:  [name] [name]    MRN: [id number] DOB: 1935-01-17  02/10/2020  [name] was observed post Covid-19 immunization for 15 minutes without incident. She was provided with Vaccine Information Sheet and instruction to access the V-Safe system.   [name] was instructed to call 911 with any severe reactions post vaccine: Marland Kitchen Difficulty breathing  . Swelling of face and throat  . A fast heartbeat  . A bad rash all over body  . Dizziness and weakness   Immunizations Administered    Name Date Dose VIS Date Route   Pfizer COVID-19 Vaccine 02/10/2020 10:38 AM 0.3 mL 11/14/2019 Intramuscular   Manufacturer: [place]   Lot: [id number]   [place]: [id number]

## 2020-02-27 ENCOUNTER — Inpatient Hospital Stay: Payer: Medicare HMO

## 2020-02-27 VITALS — BP 137/76 | HR 76

## 2020-02-27 DIAGNOSIS — N183 Chronic kidney disease, stage 3 unspecified: Secondary | ICD-10-CM | POA: Diagnosis not present

## 2020-02-27 DIAGNOSIS — D631 Anemia in chronic kidney disease: Secondary | ICD-10-CM

## 2020-02-27 DIAGNOSIS — D509 Iron deficiency anemia, unspecified: Secondary | ICD-10-CM

## 2020-02-27 DIAGNOSIS — N189 Chronic kidney disease, unspecified: Secondary | ICD-10-CM

## 2020-02-27 LAB — HEMOGLOBIN AND HEMATOCRIT, BLOOD
HCT: 30.8 % — ABNORMAL LOW (ref 36.0–46.0)
Hemoglobin: 10.6 g/dL — ABNORMAL LOW (ref 12.0–15.0)

## 2020-02-27 MED ORDER — EPOETIN ALFA-EPBX 40000 UNIT/ML IJ SOLN
40000.0000 [IU] | INTRAMUSCULAR | Status: DC
  Administered 2020-02-27: 40000 [IU] via SUBCUTANEOUS
  Filled 2020-02-27: qty 1

## 2020-03-19 ENCOUNTER — Other Ambulatory Visit: Payer: Self-pay

## 2020-03-19 ENCOUNTER — Inpatient Hospital Stay: Payer: Medicare HMO | Attending: Oncology

## 2020-03-19 ENCOUNTER — Inpatient Hospital Stay: Payer: Medicare HMO

## 2020-03-19 DIAGNOSIS — N189 Chronic kidney disease, unspecified: Secondary | ICD-10-CM | POA: Insufficient documentation

## 2020-03-19 DIAGNOSIS — D631 Anemia in chronic kidney disease: Secondary | ICD-10-CM | POA: Insufficient documentation

## 2020-03-19 LAB — HEMOGLOBIN AND HEMATOCRIT, BLOOD
HCT: 33.3 % — ABNORMAL LOW (ref 36.0–46.0)
Hemoglobin: 11.2 g/dL — ABNORMAL LOW (ref 12.0–15.0)

## 2020-04-09 ENCOUNTER — Other Ambulatory Visit: Payer: Self-pay

## 2020-04-09 ENCOUNTER — Inpatient Hospital Stay: Payer: Medicare HMO | Attending: Oncology

## 2020-04-09 ENCOUNTER — Inpatient Hospital Stay: Payer: Medicare HMO

## 2020-04-09 VITALS — BP 118/72 | HR 71

## 2020-04-09 DIAGNOSIS — E1122 Type 2 diabetes mellitus with diabetic chronic kidney disease: Secondary | ICD-10-CM | POA: Insufficient documentation

## 2020-04-09 DIAGNOSIS — D631 Anemia in chronic kidney disease: Secondary | ICD-10-CM | POA: Diagnosis present

## 2020-04-09 DIAGNOSIS — N183 Chronic kidney disease, stage 3 unspecified: Secondary | ICD-10-CM | POA: Insufficient documentation

## 2020-04-09 DIAGNOSIS — D509 Iron deficiency anemia, unspecified: Secondary | ICD-10-CM

## 2020-04-09 DIAGNOSIS — I129 Hypertensive chronic kidney disease with stage 1 through stage 4 chronic kidney disease, or unspecified chronic kidney disease: Secondary | ICD-10-CM | POA: Insufficient documentation

## 2020-04-09 DIAGNOSIS — N189 Chronic kidney disease, unspecified: Secondary | ICD-10-CM

## 2020-04-09 LAB — HEMOGLOBIN AND HEMATOCRIT, BLOOD
HCT: 31.7 % — ABNORMAL LOW (ref 36.0–46.0)
Hemoglobin: 10.7 g/dL — ABNORMAL LOW (ref 12.0–15.0)

## 2020-04-09 MED ORDER — EPOETIN ALFA-EPBX 40000 UNIT/ML IJ SOLN
40000.0000 [IU] | INTRAMUSCULAR | Status: DC
  Administered 2020-04-09: 40000 [IU] via SUBCUTANEOUS
  Filled 2020-04-09: qty 1

## 2020-04-30 ENCOUNTER — Other Ambulatory Visit: Payer: Self-pay

## 2020-04-30 ENCOUNTER — Inpatient Hospital Stay: Payer: Medicare HMO

## 2020-04-30 ENCOUNTER — Inpatient Hospital Stay (HOSPITAL_BASED_OUTPATIENT_CLINIC_OR_DEPARTMENT_OTHER): Payer: Medicare HMO | Admitting: Oncology

## 2020-04-30 VITALS — BP 104/49 | HR 64 | Temp 98.1°F | Wt 242.5 lb

## 2020-04-30 DIAGNOSIS — N183 Chronic kidney disease, stage 3 unspecified: Secondary | ICD-10-CM | POA: Diagnosis not present

## 2020-04-30 DIAGNOSIS — D631 Anemia in chronic kidney disease: Secondary | ICD-10-CM | POA: Diagnosis not present

## 2020-04-30 DIAGNOSIS — N189 Chronic kidney disease, unspecified: Secondary | ICD-10-CM | POA: Diagnosis not present

## 2020-04-30 DIAGNOSIS — D509 Iron deficiency anemia, unspecified: Secondary | ICD-10-CM

## 2020-04-30 LAB — CBC WITH DIFFERENTIAL/PLATELET
Abs Immature Granulocytes: 0.03 10*3/uL (ref 0.00–0.07)
Basophils Absolute: 0 10*3/uL (ref 0.0–0.1)
Basophils Relative: 0 %
Eosinophils Absolute: 0.1 10*3/uL (ref 0.0–0.5)
Eosinophils Relative: 2 %
HCT: 31.3 % — ABNORMAL LOW (ref 36.0–46.0)
Hemoglobin: 10.7 g/dL — ABNORMAL LOW (ref 12.0–15.0)
Immature Granulocytes: 1 %
Lymphocytes Relative: 21 %
Lymphs Abs: 1.1 10*3/uL (ref 0.7–4.0)
MCH: 26.8 pg (ref 26.0–34.0)
MCHC: 34.2 g/dL (ref 30.0–36.0)
MCV: 78.4 fL — ABNORMAL LOW (ref 80.0–100.0)
Monocytes Absolute: 0.4 10*3/uL (ref 0.1–1.0)
Monocytes Relative: 8 %
Neutro Abs: 3.8 10*3/uL (ref 1.7–7.7)
Neutrophils Relative %: 68 %
Platelets: 164 10*3/uL (ref 150–400)
RBC: 3.99 MIL/uL (ref 3.87–5.11)
RDW: 14.8 % (ref 11.5–15.5)
WBC: 5.5 10*3/uL (ref 4.0–10.5)
nRBC: 0 % (ref 0.0–0.2)

## 2020-04-30 LAB — FERRITIN: Ferritin: 98 ng/mL (ref 11–307)

## 2020-04-30 LAB — IRON AND TIBC
Iron: 45 ug/dL (ref 28–170)
Saturation Ratios: 18 % (ref 10.4–31.8)
TIBC: 245 ug/dL — ABNORMAL LOW (ref 250–450)
UIBC: 200 ug/dL

## 2020-04-30 MED ORDER — EPOETIN ALFA-EPBX 40000 UNIT/ML IJ SOLN
40000.0000 [IU] | INTRAMUSCULAR | Status: AC
  Administered 2020-04-30: 40000 [IU] via SUBCUTANEOUS
  Filled 2020-04-30: qty 1

## 2020-05-03 NOTE — Progress Notes (Signed)
Hematology/Oncology Consult note [facility]  [phone] [phone]  Patient Care Team: Dion Body, MD as PCP - General (Family Medicine) Minna Merritts, MD as Consulting Physician (Cardiology) Anthonette Legato, MD as Consulting Physician (Nephrology) Mellinger Lance, MD as Consulting Physician (Cardiology) Lorelee Cover., MD (Ophthalmology) Edrick Kins, DPM as Consulting Physician (Podiatry)   Name of the patient: [name] [name]  [id number]  19-Jun-1935   Date of visit: 05/03/20  Diagnosis- chronic anemia multifactorial secondary to anemia of chronic kidney disease and a component of iron deficiency   Chief complaint/ Reason for visit- routine f/u of anemia  Heme/Onc history: patient is a85 year old female with a past medical history significant for hypertension and stage III chronic kidney disease among other medical problems. She also has hypertension hyperlipidemia and type 2 diabetes. She was recently found by Dr. Clayton Bibles and was noted to have a hemoglobin of 9.4. Iron studies also revealed a ferritin of 22, iron saturation 13%. TIBC was normal at 302 on serum iron was low normal at 39 white count and platelets within normal limits. She has been referred to [place] for iron deficiency anemia. She has been seen by Dr. Remi Haggard GI the past and had a colonoscopy in 2017 which did not reveal any evidence of malignancy. 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 Grayland Ormond in September 2017  Patient is taking oral iron once a day for over a month now and tolerating it without any significant side effects. She denies any consistent use of NSAIDs. Denies any bleeding and stools are docked artery stools. Denies any family history of colon cancer  Blood work from 05/24/2017 was as follows: CBC showed white count of 7.8,  H&H of 10.2/30.2 with an MCV of 68.1 and a platelet count of 199. Ferritin levels were low at 14. Vitamin B12 is worse normal at 947. Folate was normal. Haptoglobin and TSH was normal. Iron studies were within normal limits. ESR was mildly elevated at 56. Multiple myeloma panel did not reveal any monoclonal protein. H. pylori stool antigen was negative.  Patient does have CKD and sees   Interval history-patient is doing well for her age and other than mild fatigue she denies other complaints  ECOG PS- 1 Pain scale- 0   Review of systems- Review of Systems  Constitutional: Positive for malaise/fatigue. Negative for chills, fever and weight loss.  HENT: Negative for congestion, ear discharge and nosebleeds.   Eyes: Negative for blurred vision.  Respiratory: Negative for cough, hemoptysis, sputum production, shortness of breath and wheezing.   Cardiovascular: Negative for chest pain, palpitations, orthopnea and claudication.  Gastrointestinal: Negative for abdominal pain, blood in stool, constipation, diarrhea, heartburn, melena, nausea and vomiting.  Genitourinary: Negative for dysuria, flank pain, frequency, hematuria and urgency.  Musculoskeletal: Negative for back pain, joint pain and myalgias.  Skin: Negative for rash.  Neurological: Negative for dizziness, tingling, focal weakness, seizures, weakness and headaches.  Endo/Heme/Allergies: Does not bruise/bleed easily.  Psychiatric/Behavioral: Negative for depression and suicidal ideas. The patient does not have insomnia.      Allergies  Allergen Reactions  . Metoprolol Other (See Comments)    Bad dreams Patient stated that she had suicidal thoughts while taking this medication. Other reaction(s): Other (See Comments) Other reaction(s): Other (See Comments) Bad dreams Patient stated that she had suicidal thoughts while taking this medication. Bad dreams Patient  stated that she had suicidal thoughts while taking this  medication. Bad dreams Patient stated that she had suicidal thoughts while taking this medication. Other reaction(s): Other (See Comments) Bad dreams   . Diltiazem Other (See Comments)    Patient stated that she had bad dreams with this medication Other reaction(s): Other (See Comments) Patient stated that she had bad dreams with this medication Other reaction(s): Other (See Comments) Patient stated that she had bad dreams with this medication Patient stated that she had bad dreams with this medication Patient stated that she had bad dreams with this medication  . Levofloxacin Nausea And Vomiting and Nausea Only    Other reaction(s): Nausea And Vomiting     Past Medical History:  Diagnosis Date  . 1st degree AV block    a. PR 400 msec with Apacing  . Anemia   . Anxiety   . Arthritis    a. shoulders.  . Chest pain    a. 2004 Cath:  reportedly nl;  b. 09/2013 Neg MV.  . Chronic diastolic heart failure ([place])    a. 09/2013 Echo: EF 50-55%, mild LVH, mild to mod MR/TR.  Marland Kitchen Chronic kidney disease   . Congestive heart disease ([place])   . Esophageal reflux   . H/O hiatal hernia   . History of UTI   . Hyperlipidemia   . Morbid obesity ([place])   . OSA on CPAP   . PAF (paroxysmal atrial fibrillation) ([place])   . SSS (sick sinus syndrome) ([place])    a. 2008 s/p MDT PPM;  b. 11/2013 Gen change- MDT ADDRL1 Adapta DC PPM, ser # [id number] H.  . Symptomatic PVCs   . Syncope and collapse    a. Felt to be vasovagal.  . Thyroid disease    on no meds per pt  . Type II diabetes mellitus ([place])   . Unspecified essential hypertension   . Unspecified glaucoma(365.9)      Past Surgical History:  Procedure Laterality Date  . APPENDECTOMY    . CARDIOVERSION N/A 04/26/2017   Procedure: Cardioversion;  Surgeon: Minna Merritts, MD;  Location: [facility] ORS;  Service: Cardiovascular;  Laterality: N/A;  . DILATION AND CURETTAGE OF UTERUS    . INCISION AND DRAINAGE OF WOUND Right 03/1998   "leg; it was  like a boil" (12/01/2013)  . INSERT / REPLACE / REMOVE PACEMAKER  2008; 12/01/2013   MDT ADDRL1 pacemaker - gen change by Dr Caryl Comes 12/01/2013  . LUNG BIOPSY  2010   [facility]  . PERMANENT PACEMAKER GENERATOR CHANGE N/A 12/01/2013   Procedure: PERMANENT PACEMAKER GENERATOR CHANGE;  Surgeon: Deboraha Sprang, MD;  Location: [facility] CATH LAB;  Service: Cardiovascular;  Laterality: N/A;  . VAGINAL HYSTERECTOMY  1970    Social History   Socioeconomic History  . Marital status: Widowed    Spouse name: Not on file  . Number of children: Y  . Years of education: Not on file  . Highest education level: Not on file  Occupational History  . Occupation: retired  Tobacco Use  . Smoking status: Former Smoker    Packs/day: 0.50    Years: 34.00    Pack years: 17.00    Types: Cigarettes    Quit date: 08/15/1989    Years since quitting: 30.7  . Smokeless tobacco: Never Used  Substance and Sexual Activity  . Alcohol use: No    Alcohol/week: 0.0 standard drinks  . Drug use: No  . Sexual activity: Never  Other Topics Concern  .  Not on file  Social History Narrative   Retired. Widowed. Regularly exercises.    Social Determinants of Health   Financial Resource Strain:   . Difficulty of Paying Living Expenses:   Food Insecurity:   . Worried About Charity fundraiser in the Last Year:   . Arboriculturist in the Last Year:   Transportation Needs:   . Film/video editor (Medical):   Marland Kitchen Lack of Transportation (Non-Medical):   Physical Activity:   . Days of Exercise per Week:   . Minutes of Exercise per Session:   Stress:   . Feeling of Stress :   Social Connections:   . Frequency of Communication with Friends and Family:   . Frequency of Social Gatherings with Friends and Family:   . Attends Religious Services:   . Active Member of Clubs or Organizations:   . Attends Archivist Meetings:   Marland Kitchen Marital Status:   Intimate Partner Violence:   . Fear of Current or Ex-Partner:   .  Emotionally Abused:   Marland Kitchen Physically Abused:   . Sexually Abused:     Family History  Problem Relation Age of Onset  . Heart disease Father   . Rheum arthritis Father   . Allergies Father   . Liver cancer Mother   . Pancreatic cancer Mother   . Hypertension Sister   . Rheum arthritis Sister   . Breast cancer Maternal Aunt      Current Outpatient Medications:  .  acetaminophen (TYLENOL) 500 MG tablet, Take 1,000 mg by mouth every 6 (six) hours as needed for moderate pain. , Disp: , Rfl:  .  allopurinol (ZYLOPRIM) 100 MG tablet, Take 200 mg by mouth daily., Disp: , Rfl:  .  amiodarone (PACERONE) 200 MG tablet, Take 100 mg by mouth 2 (two) times daily., Disp: , Rfl:  .  amLODipine (NORVASC) 10 MG tablet, TAKE 1 TABLET (10 MG TOTAL) BY MOUTH DAILY., Disp: 90 tablet, Rfl: 0 .  atorvastatin (LIPITOR) 20 MG tablet, TAKE 1 TABLET (20 MG TOTAL) BY MOUTH DAILY. (Patient taking differently: Take 20 mg by mouth at bedtime. ), Disp: 90 tablet, Rfl: 0 .  Brinzolamide-Brimonidine (SIMBRINZA) 1-0.2 % SUSP, Place 1 drop into both eyes 2 (two) times daily., Disp: 1 Bottle, Rfl: 1 .  carvedilol (COREG) 25 MG tablet, Take 25-50 mg by mouth See admin instructions. Take 1 tablet (25MG) by mouth every morning and 2 tablets (50MG) by mouth every night before bed, Disp: , Rfl:  .  ferrous sulfate 325 (65 FE) MG EC tablet, Take 325 mg by mouth 2 (two) times daily with a meal. , Disp: , Rfl:  .  furosemide (LASIX) 20 MG tablet, Take 1 tablet (20 mg total) by mouth daily., Disp: 90 tablet, Rfl: 0 .  glipiZIDE (GLUCOTROL) 10 MG tablet, TAKE 1 TABLET (10 MG TOTAL) BY MOUTH 2 (TWO) TIMES DAILY BEFORE A MEAL., Disp: 180 tablet, Rfl: 0 .  Glycerin, Laxative, (FLEET LIQUID GLYCERIN SUPP RE), Place 1 Dose rectally daily as needed (constipation)., Disp: , Rfl:  .  KLOR-CON M10 10 MEQ tablet, Take 1 tablet by mouth daily., Disp: , Rfl: 3 .  mometasone (ELOCON) 0.1 % cream, Apply topically daily. Apply to affected area as  directed two times a day (Patient taking differently: Apply topically daily. Apply to affected area as directed two times a day as needed), Disp: 45 g, Rfl: 0 .  diclofenac sodium (VOLTAREN) 1 % GEL, Apply 2  g topically 4 (four) times daily., Disp: , Rfl:  .  Multiple Vitamins-Minerals (CENTRUM SILVER PO), Take 1 tablet by mouth daily. , Disp: , Rfl:  .  olmesartan (BENICAR) 40 MG tablet, TAKE 1 TABLET (40 MG TOTAL) BY MOUTH DAILY., Disp: 90 tablet, Rfl: 0 .  polyethylene glycol (MIRALAX / GLYCOLAX) packet, Take 17 g by mouth daily as needed for moderate constipation., Disp: , Rfl:  .  TRADJENTA 5 MG TABS tablet, TAKE 1 TABLET (5 MG TOTAL) BY MOUTH DAILY. (Patient taking differently: TAKE 1 TABLET (5 MG TOTAL) BY MOUTH DAILY WITH BREAKFAST), Disp: 30 tablet, Rfl: 2 .  travoprost, benzalkonium, (TRAVATAN) 0.004 % ophthalmic solution, Place 1 drop into both eyes at bedtime. , Disp: , Rfl:  .  warfarin (COUMADIN) 3 MG tablet, TAKE 1-1.5 TABLETS DAILY AT 6 PM. TAKE 1 TAB ON TUES, THURS, SAT, SUN. TAKE 4.5 MG ON MON, WED, FRI, Disp: 100 tablet, Rfl: 3 No current facility-administered medications for this visit.  Facility-Administered Medications Ordered in Other Visits:  .  epoetin alfa-epbx (RETACRIT) injection 40,000 Units, 40,000 Units, Subcutaneous, Weekly, Sindy Guadeloupe, MD, 40,000 Units at 04/30/20 1212  Physical exam:  Vitals:   04/30/20 1116 04/30/20 1133  BP:  (!) 104/49  Pulse: 64   Temp: 98.1 F (36.7 C)   Weight: 242 lb 8 oz (110 kg)    Physical Exam Constitutional:      General: She is not in acute distress. Cardiovascular:     Rate and Rhythm: Normal rate and regular rhythm.     Heart sounds: Normal heart sounds.  Pulmonary:     Effort: Pulmonary effort is normal.     Breath sounds: Normal breath sounds.  Abdominal:     General: Bowel sounds are normal.     Palpations: Abdomen is soft.  Skin:    General: Skin is warm and dry.  Neurological:     Mental Status: She is  alert and oriented to person, place, and time.      CMP Latest Ref Rng & Units 02/06/2020  Glucose 70 - 99 mg/dL 182(H)  BUN 8 - 23 mg/dL 33(H)  Creatinine 0.44 - 1.00 mg/dL 2.89(H)  Sodium 135 - 145 mmol/L 140  Potassium 3.5 - 5.1 mmol/L 4.2  Chloride 98 - 111 mmol/L 106  CO2 22 - 32 mmol/L 24  Calcium 8.9 - 10.3 mg/dL 9.3  Total Protein 6.5 - 8.1 g/dL 6.4(L)  Total Bilirubin 0.3 - 1.2 mg/dL 0.7  Alkaline Phos 38 - 126 U/L 76  AST 15 - 41 U/L 22  ALT 0 - 44 U/L 18   CBC Latest Ref Rng & Units 04/30/2020  WBC 4.0 - 10.5 K/uL 5.5  Hemoglobin 12.0 - 15.0 g/dL 10.7(L)  Hematocrit 36.0 - 46.0 % 31.3(L)  Platelets 150 - 400 K/uL 164    Assessment and plan- Patient is a 85 y.o. female with anemia of chronic kidney disease here for routine follow-up  Patient has been receiving Retacrit since November 2020.  Her hemoglobin has remained stable around 10.  Today her hemoglobin is 10.8.  Plan is to continue Retacrit unless hemoglobin is greater than 11.  She will therefore receive Retacrit today and will need to receive it every 3 weeks.  I will see her back in 3 months for the next dose of Retacrit.   Visit Diagnosis 1. Anemia of chronic renal failure, unspecified CKD stage      Dr. Randa Evens, MD, MPH [place] [facility]  [facility] [phone] 05/03/2020 8:20 PM

## 2020-05-21 ENCOUNTER — Inpatient Hospital Stay: Payer: Medicare HMO | Attending: Oncology

## 2020-05-21 ENCOUNTER — Other Ambulatory Visit: Payer: Self-pay

## 2020-05-21 ENCOUNTER — Inpatient Hospital Stay: Payer: Medicare HMO

## 2020-05-21 DIAGNOSIS — D631 Anemia in chronic kidney disease: Secondary | ICD-10-CM

## 2020-05-21 DIAGNOSIS — N189 Chronic kidney disease, unspecified: Secondary | ICD-10-CM

## 2020-05-21 LAB — HEMOGLOBIN AND HEMATOCRIT, BLOOD
HCT: 32.5 % — ABNORMAL LOW (ref 36.0–46.0)
Hemoglobin: 11 g/dL — ABNORMAL LOW (ref 12.0–15.0)

## 2020-06-08 DIAGNOSIS — R791 Abnormal coagulation profile: Secondary | ICD-10-CM | POA: Diagnosis not present

## 2020-06-11 ENCOUNTER — Inpatient Hospital Stay: Payer: PPO

## 2020-06-11 ENCOUNTER — Inpatient Hospital Stay: Payer: PPO | Attending: Oncology

## 2020-06-11 ENCOUNTER — Other Ambulatory Visit: Payer: Self-pay

## 2020-06-11 VITALS — BP 111/66 | HR 67

## 2020-06-11 DIAGNOSIS — N189 Chronic kidney disease, unspecified: Secondary | ICD-10-CM | POA: Diagnosis not present

## 2020-06-11 DIAGNOSIS — D509 Iron deficiency anemia, unspecified: Secondary | ICD-10-CM

## 2020-06-11 DIAGNOSIS — D631 Anemia in chronic kidney disease: Secondary | ICD-10-CM | POA: Diagnosis not present

## 2020-06-11 LAB — HEMOGLOBIN AND HEMATOCRIT, BLOOD
HCT: 31.2 % — ABNORMAL LOW (ref 36.0–46.0)
Hemoglobin: 10.7 g/dL — ABNORMAL LOW (ref 12.0–15.0)

## 2020-06-11 MED ORDER — EPOETIN ALFA-EPBX 40000 UNIT/ML IJ SOLN
40000.0000 [IU] | INTRAMUSCULAR | Status: DC
  Administered 2020-06-11: 40000 [IU] via SUBCUTANEOUS
  Filled 2020-06-11: qty 1

## 2020-06-15 DIAGNOSIS — I495 Sick sinus syndrome: Secondary | ICD-10-CM | POA: Diagnosis not present

## 2020-06-16 DIAGNOSIS — E1140 Type 2 diabetes mellitus with diabetic neuropathy, unspecified: Secondary | ICD-10-CM | POA: Diagnosis not present

## 2020-06-16 DIAGNOSIS — L851 Acquired keratosis [keratoderma] palmaris et plantaris: Secondary | ICD-10-CM | POA: Diagnosis not present

## 2020-06-16 DIAGNOSIS — B351 Tinea unguium: Secondary | ICD-10-CM | POA: Diagnosis not present

## 2020-06-22 DIAGNOSIS — R791 Abnormal coagulation profile: Secondary | ICD-10-CM | POA: Diagnosis not present

## 2020-07-02 ENCOUNTER — Inpatient Hospital Stay: Payer: PPO

## 2020-07-02 ENCOUNTER — Other Ambulatory Visit: Payer: Self-pay

## 2020-07-02 DIAGNOSIS — D631 Anemia in chronic kidney disease: Secondary | ICD-10-CM

## 2020-07-02 DIAGNOSIS — N189 Chronic kidney disease, unspecified: Secondary | ICD-10-CM | POA: Diagnosis not present

## 2020-07-02 LAB — HEMOGLOBIN AND HEMATOCRIT, BLOOD
HCT: 32.8 % — ABNORMAL LOW (ref 36.0–46.0)
Hemoglobin: 11.1 g/dL — ABNORMAL LOW (ref 12.0–15.0)

## 2020-07-05 DIAGNOSIS — H2512 Age-related nuclear cataract, left eye: Secondary | ICD-10-CM | POA: Diagnosis not present

## 2020-07-05 DIAGNOSIS — G4733 Obstructive sleep apnea (adult) (pediatric): Secondary | ICD-10-CM | POA: Diagnosis not present

## 2020-07-06 DIAGNOSIS — R791 Abnormal coagulation profile: Secondary | ICD-10-CM | POA: Diagnosis not present

## 2020-07-12 DIAGNOSIS — R809 Proteinuria, unspecified: Secondary | ICD-10-CM | POA: Diagnosis not present

## 2020-07-12 DIAGNOSIS — N2581 Secondary hyperparathyroidism of renal origin: Secondary | ICD-10-CM | POA: Diagnosis not present

## 2020-07-12 DIAGNOSIS — E875 Hyperkalemia: Secondary | ICD-10-CM | POA: Diagnosis not present

## 2020-07-12 DIAGNOSIS — N184 Chronic kidney disease, stage 4 (severe): Secondary | ICD-10-CM | POA: Diagnosis not present

## 2020-07-12 DIAGNOSIS — I10 Essential (primary) hypertension: Secondary | ICD-10-CM | POA: Diagnosis not present

## 2020-07-12 DIAGNOSIS — E1122 Type 2 diabetes mellitus with diabetic chronic kidney disease: Secondary | ICD-10-CM | POA: Diagnosis not present

## 2020-07-15 ENCOUNTER — Encounter: Payer: Self-pay | Admitting: Ophthalmology

## 2020-07-15 ENCOUNTER — Other Ambulatory Visit: Payer: Self-pay

## 2020-07-19 ENCOUNTER — Other Ambulatory Visit
Admission: RE | Admit: 2020-07-19 | Discharge: 2020-07-19 | Disposition: A | Discharge status: Home / Self Care | Payer: PPO | Source: Ambulatory Visit | Attending: Ophthalmology | Admitting: Ophthalmology

## 2020-07-19 ENCOUNTER — Other Ambulatory Visit: Payer: Self-pay

## 2020-07-19 DIAGNOSIS — Z20822 Contact with and (suspected) exposure to covid-19: Secondary | ICD-10-CM | POA: Diagnosis not present

## 2020-07-19 DIAGNOSIS — R791 Abnormal coagulation profile: Secondary | ICD-10-CM | POA: Diagnosis not present

## 2020-07-19 DIAGNOSIS — Z01812 Encounter for preprocedural laboratory examination: Secondary | ICD-10-CM | POA: Diagnosis not present

## 2020-07-19 LAB — SARS CORONAVIRUS 2 (TAT 6-24 HRS): SARS Coronavirus 2: NEGATIVE

## 2020-07-19 NOTE — Discharge Instructions (Signed)

## 2020-07-21 ENCOUNTER — Ambulatory Visit: Payer: PPO | Admitting: Anesthesiology

## 2020-07-21 ENCOUNTER — Other Ambulatory Visit: Payer: Self-pay

## 2020-07-21 ENCOUNTER — Encounter: Payer: Self-pay | Admitting: Ophthalmology

## 2020-07-21 ENCOUNTER — Encounter
Admission: RE | Disposition: A | Discharge status: Home / Self Care | Payer: Self-pay | Source: Home / Self Care | Attending: Ophthalmology

## 2020-07-21 ENCOUNTER — Ambulatory Visit
Admission: RE | Admit: 2020-07-21 | Discharge: 2020-07-21 | Disposition: A | Discharge status: Home / Self Care | Payer: PPO | Attending: Ophthalmology | Admitting: Ophthalmology

## 2020-07-21 DIAGNOSIS — D631 Anemia in chronic kidney disease: Secondary | ICD-10-CM | POA: Diagnosis not present

## 2020-07-21 DIAGNOSIS — I4891 Unspecified atrial fibrillation: Secondary | ICD-10-CM | POA: Diagnosis not present

## 2020-07-21 DIAGNOSIS — M109 Gout, unspecified: Secondary | ICD-10-CM | POA: Diagnosis not present

## 2020-07-21 DIAGNOSIS — Z6841 Body Mass Index (BMI) 40.0 and over, adult: Secondary | ICD-10-CM | POA: Insufficient documentation

## 2020-07-21 DIAGNOSIS — Z950 Presence of cardiac pacemaker: Secondary | ICD-10-CM | POA: Insufficient documentation

## 2020-07-21 DIAGNOSIS — N184 Chronic kidney disease, stage 4 (severe): Secondary | ICD-10-CM | POA: Diagnosis not present

## 2020-07-21 DIAGNOSIS — E1122 Type 2 diabetes mellitus with diabetic chronic kidney disease: Secondary | ICD-10-CM | POA: Diagnosis not present

## 2020-07-21 DIAGNOSIS — H2512 Age-related nuclear cataract, left eye: Secondary | ICD-10-CM | POA: Insufficient documentation

## 2020-07-21 DIAGNOSIS — E7800 Pure hypercholesterolemia, unspecified: Secondary | ICD-10-CM | POA: Insufficient documentation

## 2020-07-21 DIAGNOSIS — K219 Gastro-esophageal reflux disease without esophagitis: Secondary | ICD-10-CM | POA: Diagnosis not present

## 2020-07-21 DIAGNOSIS — Z87891 Personal history of nicotine dependence: Secondary | ICD-10-CM | POA: Diagnosis not present

## 2020-07-21 DIAGNOSIS — Z7984 Long term (current) use of oral hypoglycemic drugs: Secondary | ICD-10-CM | POA: Insufficient documentation

## 2020-07-21 DIAGNOSIS — G4730 Sleep apnea, unspecified: Secondary | ICD-10-CM | POA: Insufficient documentation

## 2020-07-21 DIAGNOSIS — E1136 Type 2 diabetes mellitus with diabetic cataract: Secondary | ICD-10-CM | POA: Insufficient documentation

## 2020-07-21 DIAGNOSIS — Z7901 Long term (current) use of anticoagulants: Secondary | ICD-10-CM | POA: Diagnosis not present

## 2020-07-21 DIAGNOSIS — I509 Heart failure, unspecified: Secondary | ICD-10-CM | POA: Diagnosis not present

## 2020-07-21 DIAGNOSIS — I130 Hypertensive heart and chronic kidney disease with heart failure and stage 1 through stage 4 chronic kidney disease, or unspecified chronic kidney disease: Secondary | ICD-10-CM | POA: Insufficient documentation

## 2020-07-21 DIAGNOSIS — H25812 Combined forms of age-related cataract, left eye: Secondary | ICD-10-CM | POA: Diagnosis not present

## 2020-07-21 HISTORY — PX: CATARACT EXTRACTION W/PHACO: SHX586

## 2020-07-21 HISTORY — DX: Dizziness and giddiness: R42

## 2020-07-21 HISTORY — DX: Unspecified atrial fibrillation: I48.91

## 2020-07-21 HISTORY — DX: Presence of dental prosthetic device (complete) (partial): Z97.2

## 2020-07-21 HISTORY — DX: Other seasonal allergic rhinitis: J30.2

## 2020-07-21 HISTORY — DX: Gout, unspecified: M10.9

## 2020-07-21 LAB — GLUCOSE, CAPILLARY
Glucose-Capillary: 80 mg/dL (ref 70–99)
Glucose-Capillary: 76 mg/dL (ref 70–99)

## 2020-07-21 SURGERY — PHACOEMULSIFICATION, CATARACT, WITH IOL INSERTION
Anesthesia: Monitor Anesthesia Care | Site: Eye | Laterality: Left

## 2020-07-21 MED ORDER — TETRACAINE HCL 0.5 % OP SOLN
1.0000 [drp] | OPHTHALMIC | Status: DC | PRN
  Administered 2020-07-21 (×3): 1 [drp] via OPHTHALMIC

## 2020-07-21 MED ORDER — FENTANYL CITRATE (PF) 100 MCG/2ML IJ SOLN
INTRAMUSCULAR | Status: DC | PRN
  Administered 2020-07-21: 50 ug via INTRAVENOUS

## 2020-07-21 MED ORDER — BRIMONIDINE TARTRATE-TIMOLOL 0.2-0.5 % OP SOLN
OPHTHALMIC | Status: DC | PRN
  Administered 2020-07-21: 1 [drp] via OPHTHALMIC

## 2020-07-21 MED ORDER — ARMC OPHTHALMIC DILATING DROPS
1.0000 "application " | OPHTHALMIC | Status: DC | PRN
  Administered 2020-07-21 (×3): 1 via OPHTHALMIC

## 2020-07-21 MED ORDER — EPINEPHRINE PF 1 MG/ML IJ SOLN
INTRAOCULAR | Status: DC | PRN
  Administered 2020-07-21: 64 mL via OPHTHALMIC

## 2020-07-21 MED ORDER — NA HYALUR & NA CHOND-NA HYALUR 0.4-0.35 ML IO KIT
PACK | INTRAOCULAR | Status: DC | PRN
  Administered 2020-07-21: 1 mL via INTRAOCULAR

## 2020-07-21 MED ORDER — MOXIFLOXACIN HCL 0.5 % OP SOLN
1.0000 [drp] | OPHTHALMIC | Status: DC | PRN
  Administered 2020-07-21 (×3): 1 [drp] via OPHTHALMIC

## 2020-07-21 MED ORDER — CEFUROXIME OPHTHALMIC INJECTION 1 MG/0.1 ML
INJECTION | OPHTHALMIC | Status: DC | PRN
  Administered 2020-07-21: 0.1 mL via INTRACAMERAL

## 2020-07-21 MED ORDER — MIDAZOLAM HCL 2 MG/2ML IJ SOLN
INTRAMUSCULAR | Status: DC | PRN
  Administered 2020-07-21: 1 mg via INTRAVENOUS

## 2020-07-21 MED ORDER — LIDOCAINE HCL (PF) 2 % IJ SOLN
INTRAOCULAR | Status: DC | PRN
  Administered 2020-07-21: 1 mL

## 2020-07-21 SURGICAL SUPPLY — 29 items
CANNULA ANT/CHMB 27G (MISCELLANEOUS) ×1 IMPLANT
CANNULA ANT/CHMB 27GA (MISCELLANEOUS) ×2 IMPLANT
GLOVE SURG LX 7.5 STRW (GLOVE) ×2
GLOVE SURG LX STRL 7.5 STRW (GLOVE) ×1 IMPLANT
GLOVE SURG TRIUMPH 8.0 PF LTX (GLOVE) ×2 IMPLANT
GOWN STRL REUS W/ TWL LRG LVL3 (GOWN DISPOSABLE) ×2 IMPLANT
GOWN STRL REUS W/TWL LRG LVL3 (GOWN DISPOSABLE) ×4
LENS IOL DIOP 23.5 (Intraocular Lens) ×2 IMPLANT
LENS IOL TECNIS MONO 23.5 (Intraocular Lens) IMPLANT
MARKER SKIN DUAL TIP RULER LAB (MISCELLANEOUS) ×2 IMPLANT
NDL CAPSULORHEX 25GA (NEEDLE) ×1 IMPLANT
NDL FILTER BLUNT 18X1 1/2 (NEEDLE) ×2 IMPLANT
NDL RETROBULBAR .5 NSTRL (NEEDLE) IMPLANT
NEEDLE CAPSULORHEX 25GA (NEEDLE) ×2 IMPLANT
NEEDLE FILTER BLUNT 18X 1/2SAF (NEEDLE) ×2
NEEDLE FILTER BLUNT 18X1 1/2 (NEEDLE) ×2 IMPLANT
PACK CATARACT BRASINGTON (MISCELLANEOUS) ×2 IMPLANT
PACK EYE AFTER SURG (MISCELLANEOUS) ×2 IMPLANT
PACK OPTHALMIC (MISCELLANEOUS) ×2 IMPLANT
RING MALYGIN 7.0 (MISCELLANEOUS) IMPLANT
SOLUTION OPHTHALMIC SALT (MISCELLANEOUS) ×2 IMPLANT
SUT ETHILON 10-0 CS-B-6CS-B-6 (SUTURE)
SUT VICRYL  9 0 (SUTURE)
SUT VICRYL 9 0 (SUTURE) IMPLANT
SUTURE EHLN 10-0 CS-B-6CS-B-6 (SUTURE) IMPLANT
SYR 3ML LL SCALE MARK (SYRINGE) ×4 IMPLANT
SYR TB 1ML LUER SLIP (SYRINGE) ×2 IMPLANT
WATER STERILE IRR 250ML POUR (IV SOLUTION) ×2 IMPLANT
WIPE NON LINTING 3.25X3.25 (MISCELLANEOUS) ×2 IMPLANT

## 2020-07-21 NOTE — Anesthesia Postprocedure Evaluation (Signed)
Anesthesia Post Note  Patient: [name] [name]  Procedure(s) Performed: CATARACT EXTRACTION PHACO AND INTRAOCULAR LENS PLACEMENT (IOC) LEFT DIABETIC (Left Eye)     Anesthesia Post Evaluation No complications documented.  Danil Wedge [employer]

## 2020-07-21 NOTE — Transfer of Care (Signed)
Immediate Anesthesia Transfer of Care Note  Patient: [name] [name]  Procedure(s) Performed: CATARACT EXTRACTION PHACO AND INTRAOCULAR LENS PLACEMENT (IOC) LEFT DIABETIC (Left Eye)  Patient Location: PACU  Anesthesia Type: MAC  Level of Consciousness: awake, alert  and patient cooperative  Airway and Oxygen Therapy: Patient Spontanous Breathing and Patient connected to supplemental oxygen  Post-op Assessment: Post-op Vital signs reviewed, Patient's Cardiovascular Status Stable, Respiratory Function Stable, Patent Airway and No signs of Nausea or vomiting  Post-op Vital Signs: Reviewed and stable  Complications: No complications documented.

## 2020-07-21 NOTE — H&P (Signed)

## 2020-07-21 NOTE — Anesthesia Preprocedure Evaluation (Addendum)
Anesthesia Evaluation  Patient identified by MRN, date of birth, ID band Patient awake    Reviewed: Allergy & Precautions, NPO status , Patient's Chart, lab work & pertinent test results, reviewed documented beta blocker date and time   Airway Mallampati: II  TM Distance: >3 FB Neck ROM: Full    Dental  (+) Upper Dentures, Partial Lower   Pulmonary sleep apnea and Continuous Positive Airway Pressure Ventilation , former smoker,    Pulmonary exam normal        Cardiovascular hypertension, Normal cardiovascular exam+ dysrhythmias (Last coumadin 07/20/2020) Atrial Fibrillation + pacemaker  Rhythm:Regular Rate:Normal  ECHO 12/2018 - Left ventricle: The cavity size was normal. Wall thickness was increased in a pattern of mild LVH. Systolic function was normal. The estimated ejection fraction was in the range of 55% to 65%.  - Mitral valve: There was mild regurgitation.  - Left atrium: The atrium was mildly dilated.   Neuro/Psych Anxiety    GI/Hepatic GERD  Controlled,  Endo/Other  diabetes, Type 2Morbid obesity  Renal/GU CRFRenal disease     Musculoskeletal  (+) Arthritis ,   Abdominal (+) + obese,   Peds  Hematology  (+) anemia ,   Anesthesia Other Findings   Reproductive/Obstetrics                           Anesthesia Physical Anesthesia Plan  ASA: III  Anesthesia Plan: MAC   Post-op Pain Management:    Induction: Intravenous  PONV Risk Score and Plan: 2 and Ondansetron, TIVA, Midazolam and Treatment may vary due to age or medical condition  Airway Management Planned: Nasal Cannula  Additional Equipment: None  Intra-op Plan:   Post-operative Plan:   Informed Consent: I have reviewed the patients History and Physical, chart, labs and discussed the procedure including the risks, benefits and alternatives for the proposed anesthesia with the patient or authorized representative who has  indicated his/her understanding and acceptance.     Dental advisory given  Plan Discussed with: CRNA  Anesthesia Plan Comments:         Anesthesia Quick Evaluation

## 2020-07-21 NOTE — Anesthesia Procedure Notes (Signed)
Procedure Name: MAC Date/Time: 07/21/2020 7:53 AM Performed by: Silvana Newness, CRNA Pre-anesthesia Checklist: Patient identified, Emergency Drugs available, Suction available, Patient being monitored and Timeout performed Patient Re-evaluated:Patient Re-evaluated prior to induction Oxygen Delivery Method: Nasal cannula Placement Confirmation: positive ETCO2

## 2020-07-21 NOTE — Op Note (Signed)
OPERATIVE NOTE  [name] [name] [id number] 07/21/2020   PREOPERATIVE DIAGNOSIS:  Nuclear sclerotic cataract left eye. H25.12   POSTOPERATIVE DIAGNOSIS:    Nuclear sclerotic cataract left eye.     PROCEDURE:  Phacoemusification with posterior chamber intraocular lens placement of the left eye  Ultrasound time: Procedure(s) with comments: CATARACT EXTRACTION PHACO AND INTRAOCULAR LENS PLACEMENT (IOC) LEFT DIABETIC (Left) - 10.25 1:13.2 14.0%  LENS:   Implant Name Type Inv. Item Serial No. Manufacturer Lot No. LRB No. Used Action  LENS IOL DIOP 23.5 - [id number] Intraocular Lens LENS IOL DIOP 23.5 [id number] [employer]  Left 1 Implanted      SURGEON:  Wyonia Hough, MD   ANESTHESIA:  Topical with tetracaine drops and 2% Xylocaine jelly, augmented with 1% preservative-free intracameral lidocaine.    COMPLICATIONS:  None.   DESCRIPTION OF PROCEDURE:  The patient was identified in the holding room and transported to the operating room and placed in the supine position under the operating microscope.  The left eye was identified as the operative eye and it was prepped and draped in the usual sterile ophthalmic fashion.   A 1 millimeter clear-corneal paracentesis was made at the 1:30 position.  0.5 ml of preservative-free 1% lidocaine was injected into the anterior chamber.  The anterior chamber was filled with Viscoat viscoelastic.  A 2.4 millimeter keratome was used to make a near-clear corneal incision at the 10:30 position.  .  A curvilinear capsulorrhexis was made with a cystotome and capsulorrhexis forceps.  Balanced salt solution was used to hydrodissect and hydrodelineate the nucleus.   Phacoemulsification was then used in stop and chop fashion to remove the lens nucleus and epinucleus.  The remaining cortex was then removed using the irrigation and aspiration handpiece. Provisc was then placed into the capsular bag to distend it for lens placement.  A lens was  then injected into the capsular bag.  The remaining viscoelastic was aspirated.   Wounds were hydrated with balanced salt solution.  The anterior chamber was inflated to a physiologic pressure with balanced salt solution.  No wound leaks were noted. Cefuroxime 0.1 ml of a 10mg /ml solution was injected into the anterior chamber for a dose of 1 mg of intracameral antibiotic at the completion of the case.   Timolol and Brimonidine drops were applied to the eye.  The patient was taken to the recovery room in stable condition without complications of anesthesia or surgery.  [name] [name] 07/21/2020, 8:05 AM

## 2020-07-22 ENCOUNTER — Encounter: Payer: Self-pay | Admitting: Ophthalmology

## 2020-07-23 ENCOUNTER — Other Ambulatory Visit: Payer: Medicare HMO

## 2020-07-23 ENCOUNTER — Ambulatory Visit: Payer: Medicare HMO

## 2020-07-23 ENCOUNTER — Ambulatory Visit: Payer: Medicare HMO | Admitting: Oncology

## 2020-07-28 ENCOUNTER — Other Ambulatory Visit: Payer: Self-pay | Admitting: Family Medicine

## 2020-07-28 DIAGNOSIS — Z1231 Encounter for screening mammogram for malignant neoplasm of breast: Secondary | ICD-10-CM

## 2020-07-29 DIAGNOSIS — N189 Chronic kidney disease, unspecified: Secondary | ICD-10-CM | POA: Diagnosis not present

## 2020-07-29 DIAGNOSIS — H2511 Age-related nuclear cataract, right eye: Secondary | ICD-10-CM | POA: Diagnosis not present

## 2020-08-02 DIAGNOSIS — I480 Paroxysmal atrial fibrillation: Secondary | ICD-10-CM | POA: Diagnosis not present

## 2020-08-04 ENCOUNTER — Other Ambulatory Visit: Payer: Self-pay

## 2020-08-04 ENCOUNTER — Encounter: Payer: Self-pay | Admitting: Ophthalmology

## 2020-08-04 DIAGNOSIS — G4733 Obstructive sleep apnea (adult) (pediatric): Secondary | ICD-10-CM | POA: Diagnosis not present

## 2020-08-05 ENCOUNTER — Inpatient Hospital Stay: Payer: PPO | Attending: Oncology | Admitting: Oncology

## 2020-08-05 ENCOUNTER — Inpatient Hospital Stay: Payer: PPO

## 2020-08-05 ENCOUNTER — Encounter: Payer: Self-pay | Admitting: Oncology

## 2020-08-05 VITALS — BP 138/64 | HR 66 | Temp 96.4°F | Resp 16 | Wt 243.8 lb

## 2020-08-05 DIAGNOSIS — D631 Anemia in chronic kidney disease: Secondary | ICD-10-CM

## 2020-08-05 DIAGNOSIS — Z803 Family history of malignant neoplasm of breast: Secondary | ICD-10-CM | POA: Diagnosis not present

## 2020-08-05 DIAGNOSIS — D649 Anemia, unspecified: Secondary | ICD-10-CM | POA: Diagnosis not present

## 2020-08-05 DIAGNOSIS — Z87891 Personal history of nicotine dependence: Secondary | ICD-10-CM | POA: Insufficient documentation

## 2020-08-05 DIAGNOSIS — Z79899 Other long term (current) drug therapy: Secondary | ICD-10-CM | POA: Diagnosis not present

## 2020-08-05 DIAGNOSIS — Z8249 Family history of ischemic heart disease and other diseases of the circulatory system: Secondary | ICD-10-CM | POA: Diagnosis not present

## 2020-08-05 DIAGNOSIS — Z8261 Family history of arthritis: Secondary | ICD-10-CM | POA: Insufficient documentation

## 2020-08-05 DIAGNOSIS — N189 Chronic kidney disease, unspecified: Secondary | ICD-10-CM

## 2020-08-05 DIAGNOSIS — Z800 Family history of malignant neoplasm of digestive organs: Secondary | ICD-10-CM | POA: Insufficient documentation

## 2020-08-05 DIAGNOSIS — I129 Hypertensive chronic kidney disease with stage 1 through stage 4 chronic kidney disease, or unspecified chronic kidney disease: Secondary | ICD-10-CM | POA: Diagnosis not present

## 2020-08-05 DIAGNOSIS — N183 Chronic kidney disease, stage 3 unspecified: Secondary | ICD-10-CM | POA: Diagnosis not present

## 2020-08-05 DIAGNOSIS — N1831 Chronic kidney disease, stage 3a: Secondary | ICD-10-CM | POA: Diagnosis not present

## 2020-08-05 DIAGNOSIS — E785 Hyperlipidemia, unspecified: Secondary | ICD-10-CM | POA: Diagnosis not present

## 2020-08-05 DIAGNOSIS — E1122 Type 2 diabetes mellitus with diabetic chronic kidney disease: Secondary | ICD-10-CM | POA: Diagnosis not present

## 2020-08-05 DIAGNOSIS — D509 Iron deficiency anemia, unspecified: Secondary | ICD-10-CM

## 2020-08-05 LAB — HEMOGLOBIN AND HEMATOCRIT, BLOOD
HCT: 31.8 % — ABNORMAL LOW (ref 36.0–46.0)
Hemoglobin: 10.9 g/dL — ABNORMAL LOW (ref 12.0–15.0)

## 2020-08-05 MED ORDER — EPOETIN ALFA-EPBX 40000 UNIT/ML IJ SOLN
40000.0000 [IU] | INTRAMUSCULAR | Status: DC
  Administered 2020-08-05: 40000 [IU] via SUBCUTANEOUS
  Filled 2020-08-05: qty 1

## 2020-08-05 NOTE — Progress Notes (Signed)
Hematology/Oncology Consult note [facility]  Telephone:([phone] [phone]  Patient Care Team: Dion Body, MD as PCP - General (Family Medicine) Minna Merritts, MD as Consulting Physician (Cardiology) Anthonette Legato, MD as Consulting Physician (Nephrology) Orrico Lance, MD as Consulting Physician (Cardiology) Lorelee Cover., MD (Ophthalmology) Edrick Kins, DPM as Consulting Physician (Podiatry)   Name of the patient: [name] [name]  [id number]  November 14, 1935   Date of visit: 08/05/20  Diagnosis- chronic anemia multifactorial secondary to anemia of chronic kidney disease and a component of iron deficiency   Chief complaint/ Reason for visit- routine f/u of anemia  Heme/Onc history: patient is a85 year old female with a past medical history significant for hypertension and stage III chronic kidney disease among other medical problems. She also has hypertension hyperlipidemia and type 2 diabetes. She was recently found by Dr. Clayton Bibles and was noted to have a hemoglobin of 9.4. Iron studies also revealed a ferritin of 22, iron saturation 13%. TIBC was normal at 302 on serum iron was low normal at 39 white count and platelets within normal limits. She has been referred to [place] for iron deficiency anemia. She has been seen by Dr. Remi Haggard GI the past and had a colonoscopy in 2017 which did not reveal any evidence of malignancy. 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 Grayland Ormond in September 2017  Blood work from 05/24/2017 was as follows: CBC showed white count of 7.8, H&H of 10.2/30.2 with an MCV of 68.1 and a platelet count of 199. Ferritin levels were low at 14. Vitamin B12 is worse normal at 947. Folate was normal. Haptoglobin and TSH was normal. Iron studies were within normal limits. ESR was mildly elevated at 56.  Multiple myeloma panel did not reveal any monoclonal protein. H. pylori stool antigen was negative.   Interval history- she reports doing well. Recently underwent cataract surgery. Denies other complaints at this time  ECOG PS- 1 Pain scale- 0   Review of systems- Review of Systems  Constitutional: Negative for chills, fever, malaise/fatigue and weight loss.  HENT: Negative for congestion, ear discharge and nosebleeds.   Eyes: Negative for blurred vision.  Respiratory: Negative for cough, hemoptysis, sputum production, shortness of breath and wheezing.   Cardiovascular: Negative for chest pain, palpitations, orthopnea and claudication.  Gastrointestinal: Negative for abdominal pain, blood in stool, constipation, diarrhea, heartburn, melena, nausea and vomiting.  Genitourinary: Negative for dysuria, flank pain, frequency, hematuria and urgency.  Musculoskeletal: Negative for back pain, joint pain and myalgias.  Skin: Negative for rash.  Neurological: Negative for dizziness, tingling, focal weakness, seizures, weakness and headaches.  Endo/Heme/Allergies: Does not bruise/bleed easily.  Psychiatric/Behavioral: Negative for depression and suicidal ideas. The patient does not have insomnia.       Allergies  Allergen Reactions  . Metoprolol Other (See Comments)    Bad dreams Patient stated that she had suicidal thoughts while taking this medication.   . Diltiazem Other (See Comments)    Patient stated that she had bad dreams with this medication  . Levofloxacin Nausea And Vomiting and Nausea Only          Past Medical History:  Diagnosis Date  . 1st degree AV block    a. PR 400 msec with Apacing  . A-fib (HCC)    Hx of  . Anemia   . Anxiety   . Arthritis  knees and lower back   . Chest pain    a. 2004 Cath:  reportedly nl;  b. 09/2013 Neg MV.  . Chronic diastolic heart failure ([place])    a. 09/2013 Echo: EF 50-55%, mild LVH, mild to mod MR/TR.  Marland Kitchen Chronic kidney disease     Stage IV  . Congestive heart disease ([place])   . Esophageal reflux   . Gout   . H/O hiatal hernia   . History of UTI   . Hyperlipidemia   . Morbid obesity ([place])   . OSA on CPAP    CPAP  . Pacemaker    copy of medtronic card in chart  . PAF (paroxysmal atrial fibrillation) ([place])   . Seasonal allergies   . SSS (sick sinus syndrome) ([place])    a. 2008 s/p MDT PPM;  b. 11/2013 Gen change- MDT ADDRL1 Adapta DC PPM, ser # [id number] H.  . Symptomatic PVCs   . Syncope and collapse    a. Felt to be vasovagal.  . Thyroid disease    nodules on thyroid, with goiter, on no meds per pt  . Type II diabetes mellitus (HCC)    Type 2  . Unspecified essential hypertension    controlled on meds  . Unspecified glaucoma(365.9)   . Vertigo    in past  . Wears dentures    upper full and lower partial     Past Surgical History:  Procedure Laterality Date  . APPENDECTOMY    . CARDIAC CATHETERIZATION    . CARDIOVERSION N/A 04/26/2017   Procedure: Cardioversion;  Surgeon: Minna Merritts, MD;  Location: [facility] ORS;  Service: Cardiovascular;  Laterality: N/A;  . CATARACT EXTRACTION W/PHACO Left 07/21/2020   Procedure: CATARACT EXTRACTION PHACO AND INTRAOCULAR LENS PLACEMENT ([place]) LEFT DIABETIC;  Surgeon: Leandrew Koyanagi, MD;  Location: [place];  Service: Ophthalmology;  Laterality: Left;  10.25 1:13.2 14.0%  . DILATION AND CURETTAGE OF UTERUS    . INCISION AND DRAINAGE OF WOUND Right 03/1998   "leg; it was like a boil" (12/01/2013)  . INSERT / REPLACE / REMOVE PACEMAKER  2008; 12/01/2013   MDT ADDRL1 pacemaker - gen change by Dr Caryl Comes 12/01/2013  . LUNG BIOPSY  2010   [facility]  . PERMANENT PACEMAKER GENERATOR CHANGE N/A 12/01/2013   Procedure: PERMANENT PACEMAKER GENERATOR CHANGE;  Surgeon: Deboraha Sprang, MD;  Location: [facility] CATH LAB;  Service: Cardiovascular;  Laterality: N/A;  . VAGINAL HYSTERECTOMY  1970    Social History   Socioeconomic History  . Marital status: Widowed     Spouse name: Not on file  . Number of children: Y  . Years of education: Not on file  . Highest education level: Not on file  Occupational History  . Occupation: retired  Tobacco Use  . Smoking status: Former Smoker    Packs/day: 0.50    Years: 34.00    Pack years: 17.00    Types: Cigarettes    Quit date: 08/15/1989    Years since quitting: 30.9  . Smokeless tobacco: Never Used  Vaping Use  . Vaping Use: Never used  Substance and Sexual Activity  . Alcohol use: No    Alcohol/week: 0.0 standard drinks  . Drug use: No  . Sexual activity: Never  Other Topics Concern  . Not on file  Social History Narrative   Retired. Widowed. Regularly exercises.    Social Determinants of Health   Financial Resource Strain:   . Difficulty of Paying Living Expenses: Not on  file  Food Insecurity:   . Worried About Charity fundraiser in the Last Year: Not on file  . Ran Out of Food in the Last Year: Not on file  Transportation Needs:   . Lack of Transportation (Medical): Not on file  . Lack of Transportation (Non-Medical): Not on file  Physical Activity:   . Days of Exercise per Week: Not on file  . Minutes of Exercise per Session: Not on file  Stress:   . Feeling of Stress : Not on file  Social Connections:   . Frequency of Communication with Friends and Family: Not on file  . Frequency of Social Gatherings with Friends and Family: Not on file  . Attends Religious Services: Not on file  . Active Member of Clubs or Organizations: Not on file  . Attends Archivist Meetings: Not on file  . Marital Status: Not on file  Intimate Partner Violence:   . Fear of Current or Ex-Partner: Not on file  . Emotionally Abused: Not on file  . Physically Abused: Not on file  . Sexually Abused: Not on file    Family History  Problem Relation Age of Onset  . Heart disease Father   . Rheum arthritis Father   . Allergies Father   . Liver cancer Mother   . Pancreatic cancer Mother   .  Hypertension Sister   . Rheum arthritis Sister   . Breast cancer Maternal Aunt      Current Outpatient Medications:  .  acetaminophen (TYLENOL) 500 MG tablet, Take 1,000 mg by mouth every 6 (six) hours as needed for moderate pain. , Disp: , Rfl:  .  allopurinol (ZYLOPRIM) 100 MG tablet, Take 100 mg by mouth daily. , Disp: , Rfl:  .  amiodarone (PACERONE) 200 MG tablet, Take 100 mg by mouth 2 (two) times daily., Disp: , Rfl:  .  amLODipine (NORVASC) 10 MG tablet, TAKE 1 TABLET (10 MG TOTAL) BY MOUTH DAILY. (Patient taking differently: Take 5 mg by mouth daily. ), Disp: 90 tablet, Rfl: 0 .  atorvastatin (LIPITOR) 20 MG tablet, TAKE 1 TABLET (20 MG TOTAL) BY MOUTH DAILY. (Patient taking differently: Take 20 mg by mouth at bedtime. ), Disp: 90 tablet, Rfl: 0 .  Brinzolamide-Brimonidine (SIMBRINZA) 1-0.2 % SUSP, Place 1 drop into both eyes 2 (two) times daily., Disp: 1 Bottle, Rfl: 1 .  carvedilol (COREG) 25 MG tablet, Take 25-50 mg by mouth See admin instructions. Take 1 tablet (25MG) by mouth every morning and 2 tablets (50MG) by mouth every night before bed, Disp: , Rfl:  .  diclofenac sodium (VOLTAREN) 1 % GEL, Apply 2 g topically 4 (four) times daily., Disp: , Rfl:  .  ferrous sulfate 325 (65 FE) MG EC tablet, Take 325 mg by mouth daily. , Disp: , Rfl:  .  furosemide (LASIX) 20 MG tablet, Take 1 tablet (20 mg total) by mouth daily., Disp: 90 tablet, Rfl: 0 .  glipiZIDE (GLUCOTROL) 10 MG tablet, TAKE 1 TABLET (10 MG TOTAL) BY MOUTH 2 (TWO) TIMES DAILY BEFORE A MEAL., Disp: 180 tablet, Rfl: 0 .  Glycerin, Laxative, (FLEET LIQUID GLYCERIN SUPP RE), Place 1 Dose rectally daily as needed (constipation)., Disp: , Rfl:  .  KLOR-CON M10 10 MEQ tablet, Take 1 tablet by mouth daily., Disp: , Rfl: 3 .  mometasone (ELOCON) 0.1 % cream, Apply topically daily. Apply to affected area as directed two times a day (Patient taking differently: Apply topically daily. Apply to  affected area as directed two times a  day as needed), Disp: 45 g, Rfl: 0 .  Multiple Vitamins-Minerals (CENTRUM SILVER PO), Take 1 tablet by mouth daily. , Disp: , Rfl:  .  olmesartan (BENICAR) 40 MG tablet, TAKE 1 TABLET (40 MG TOTAL) BY MOUTH DAILY., Disp: 90 tablet, Rfl: 0 .  pantoprazole (PROTONIX) 40 MG tablet, Take 40 mg by mouth daily., Disp: , Rfl:  .  polyethylene glycol (MIRALAX / GLYCOLAX) packet, Take 17 g by mouth daily as needed for moderate constipation., Disp: , Rfl:  .  TRADJENTA 5 MG TABS tablet, TAKE 1 TABLET (5 MG TOTAL) BY MOUTH DAILY. (Patient taking differently: TAKE 1 TABLET (5 MG TOTAL) BY MOUTH DAILY WITH BREAKFAST), Disp: 30 tablet, Rfl: 2 .  travoprost, benzalkonium, (TRAVATAN) 0.004 % ophthalmic solution, Place 1 drop into both eyes at bedtime. , Disp: , Rfl:  .  warfarin (COUMADIN) 3 MG tablet, TAKE 1-1.5 TABLETS DAILY AT 6 PM. TAKE 1 TAB ON TUES, THURS, SAT, SUN. TAKE 4.5 MG ON MON, WED, FRI (Patient taking differently: 3 mg Monday-Saturday, 64m on Sunday), Disp: 100 tablet, Rfl: 3 No current facility-administered medications for this visit.  Facility-Administered Medications Ordered in Other Visits:  .  epoetin alfa-epbx (RETACRIT) injection 40,000 Units, 40,000 Units, Subcutaneous, Weekly, RSindy Guadeloupe MD, 40,000 Units at 08/05/20 1059  Physical exam:  Vitals:   08/05/20 0956  BP: 138/64  Pulse: 66  Resp: 16  Temp: (!) 96.4 F (35.8 C)  TempSrc: Tympanic  SpO2: 100%  Weight: 243 lb 12.8 oz (110.6 kg)   Physical Exam Constitutional:      General: She is not in acute distress. Cardiovascular:     Rate and Rhythm: Normal rate and regular rhythm.     Heart sounds: Normal heart sounds.  Pulmonary:     Effort: Pulmonary effort is normal.     Breath sounds: Normal breath sounds.  Abdominal:     General: Bowel sounds are normal.     Palpations: Abdomen is soft.  Skin:    General: Skin is warm and dry.  Neurological:     Mental Status: She is alert and oriented to person, place, and  time.      CMP Latest Ref Rng & Units 02/06/2020  Glucose 70 - 99 mg/dL 182(H)  BUN 8 - 23 mg/dL 33(H)  Creatinine 0.44 - 1.00 mg/dL 2.89(H)  Sodium 135 - 145 mmol/L 140  Potassium 3.5 - 5.1 mmol/L 4.2  Chloride 98 - 111 mmol/L 106  CO2 22 - 32 mmol/L 24  Calcium 8.9 - 10.3 mg/dL 9.3  Total Protein 6.5 - 8.1 g/dL 6.4(L)  Total Bilirubin 0.3 - 1.2 mg/dL 0.7  Alkaline Phos 38 - 126 U/L 76  AST 15 - 41 U/L 22  ALT 0 - 44 U/L 18   CBC Latest Ref Rng & Units 08/05/2020  WBC 4.0 - 10.5 K/uL -  Hemoglobin 12.0 - 15.0 g/dL 10.9(L)  Hematocrit 36 - 46 % 31.8(L)  Platelets 150 - 400 K/uL -      Assessment and plan- Patient is a 85y.o. female with anemia of CKD here for routine f/u  Patient is doing well with regards to her anemia and continues to get Retacrit every 3 weeks.  Today her Hemoglobin is 10.9 and we will plan to give her Retacrit as long as it is less than 11.  She will therefore receive her Retacrit today and continue to get it every 3 weeks and  I will see her back in 3 months with CBC ferritin iron studies    Visit Diagnosis 1. Anemia of chronic renal failure, stage 3a   2. Erythropoietin (EPO) stimulating agent anemia management patient      Dr. Randa Evens, MD, MPH [facility] [phone] 08/05/2020 12:54 PM

## 2020-08-06 ENCOUNTER — Other Ambulatory Visit: Payer: Self-pay

## 2020-08-06 ENCOUNTER — Other Ambulatory Visit: Admission: RE | Admit: 2020-08-06 | Payer: PPO | Source: Ambulatory Visit

## 2020-08-06 ENCOUNTER — Ambulatory Visit
Admission: RE | Admit: 2020-08-06 | Discharge: 2020-08-06 | Disposition: A | Discharge status: Home / Self Care | Payer: PPO | Source: Ambulatory Visit | Attending: Family Medicine | Admitting: Family Medicine

## 2020-08-06 DIAGNOSIS — Z1231 Encounter for screening mammogram for malignant neoplasm of breast: Secondary | ICD-10-CM | POA: Insufficient documentation

## 2020-08-10 ENCOUNTER — Other Ambulatory Visit
Admission: RE | Admit: 2020-08-10 | Discharge: 2020-08-10 | Disposition: A | Discharge status: Home / Self Care | Payer: PPO | Source: Ambulatory Visit | Attending: Ophthalmology | Admitting: Ophthalmology

## 2020-08-10 ENCOUNTER — Other Ambulatory Visit: Payer: Self-pay

## 2020-08-10 DIAGNOSIS — Z01812 Encounter for preprocedural laboratory examination: Secondary | ICD-10-CM | POA: Diagnosis not present

## 2020-08-10 DIAGNOSIS — Z20822 Contact with and (suspected) exposure to covid-19: Secondary | ICD-10-CM | POA: Insufficient documentation

## 2020-08-10 NOTE — Discharge Instructions (Signed)

## 2020-08-11 ENCOUNTER — Other Ambulatory Visit: Payer: Self-pay

## 2020-08-11 ENCOUNTER — Encounter: Payer: Self-pay | Admitting: Ophthalmology

## 2020-08-11 ENCOUNTER — Ambulatory Visit
Admission: RE | Admit: 2020-08-11 | Discharge: 2020-08-11 | Disposition: A | Discharge status: Home / Self Care | Payer: PPO | Attending: Ophthalmology | Admitting: Ophthalmology

## 2020-08-11 ENCOUNTER — Ambulatory Visit: Payer: PPO | Admitting: Anesthesiology

## 2020-08-11 ENCOUNTER — Encounter
Admission: RE | Disposition: A | Discharge status: Home / Self Care | Payer: Self-pay | Source: Home / Self Care | Attending: Ophthalmology

## 2020-08-11 DIAGNOSIS — K219 Gastro-esophageal reflux disease without esophagitis: Secondary | ICD-10-CM | POA: Insufficient documentation

## 2020-08-11 DIAGNOSIS — Z79899 Other long term (current) drug therapy: Secondary | ICD-10-CM | POA: Insufficient documentation

## 2020-08-11 DIAGNOSIS — H25811 Combined forms of age-related cataract, right eye: Secondary | ICD-10-CM | POA: Diagnosis not present

## 2020-08-11 DIAGNOSIS — Z950 Presence of cardiac pacemaker: Secondary | ICD-10-CM | POA: Insufficient documentation

## 2020-08-11 DIAGNOSIS — I4891 Unspecified atrial fibrillation: Secondary | ICD-10-CM | POA: Insufficient documentation

## 2020-08-11 DIAGNOSIS — N184 Chronic kidney disease, stage 4 (severe): Secondary | ICD-10-CM | POA: Insufficient documentation

## 2020-08-11 DIAGNOSIS — Z7984 Long term (current) use of oral hypoglycemic drugs: Secondary | ICD-10-CM | POA: Insufficient documentation

## 2020-08-11 DIAGNOSIS — H2511 Age-related nuclear cataract, right eye: Secondary | ICD-10-CM | POA: Insufficient documentation

## 2020-08-11 DIAGNOSIS — M109 Gout, unspecified: Secondary | ICD-10-CM | POA: Diagnosis not present

## 2020-08-11 DIAGNOSIS — E7800 Pure hypercholesterolemia, unspecified: Secondary | ICD-10-CM | POA: Diagnosis not present

## 2020-08-11 DIAGNOSIS — G4730 Sleep apnea, unspecified: Secondary | ICD-10-CM | POA: Insufficient documentation

## 2020-08-11 DIAGNOSIS — E049 Nontoxic goiter, unspecified: Secondary | ICD-10-CM | POA: Diagnosis not present

## 2020-08-11 DIAGNOSIS — Z7901 Long term (current) use of anticoagulants: Secondary | ICD-10-CM | POA: Diagnosis not present

## 2020-08-11 DIAGNOSIS — Z9842 Cataract extraction status, left eye: Secondary | ICD-10-CM | POA: Insufficient documentation

## 2020-08-11 DIAGNOSIS — I509 Heart failure, unspecified: Secondary | ICD-10-CM | POA: Insufficient documentation

## 2020-08-11 DIAGNOSIS — E119 Type 2 diabetes mellitus without complications: Secondary | ICD-10-CM | POA: Diagnosis not present

## 2020-08-11 DIAGNOSIS — I110 Hypertensive heart disease with heart failure: Secondary | ICD-10-CM | POA: Diagnosis not present

## 2020-08-11 DIAGNOSIS — M1990 Unspecified osteoarthritis, unspecified site: Secondary | ICD-10-CM | POA: Diagnosis not present

## 2020-08-11 HISTORY — PX: CATARACT EXTRACTION W/PHACO: SHX586

## 2020-08-11 LAB — GLUCOSE, CAPILLARY
Glucose-Capillary: 93 mg/dL (ref 70–99)
Glucose-Capillary: 96 mg/dL (ref 70–99)

## 2020-08-11 LAB — SARS CORONAVIRUS 2 (TAT 6-24 HRS): SARS Coronavirus 2: NEGATIVE

## 2020-08-11 SURGERY — PHACOEMULSIFICATION, CATARACT, WITH IOL INSERTION
Anesthesia: Monitor Anesthesia Care | Site: Eye | Laterality: Right

## 2020-08-11 MED ORDER — TETRACAINE HCL 0.5 % OP SOLN
1.0000 [drp] | OPHTHALMIC | Status: DC | PRN
  Administered 2020-08-11 (×3): 1 [drp] via OPHTHALMIC

## 2020-08-11 MED ORDER — CEFUROXIME OPHTHALMIC INJECTION 1 MG/0.1 ML
INJECTION | OPHTHALMIC | Status: DC | PRN
  Administered 2020-08-11: 0.1 mL via INTRACAMERAL

## 2020-08-11 MED ORDER — FENTANYL CITRATE (PF) 100 MCG/2ML IJ SOLN
INTRAMUSCULAR | Status: DC | PRN
Start: 2020-08-11 — End: 2020-08-11
  Administered 2020-08-11 (×2): 50 ug via INTRAVENOUS

## 2020-08-11 MED ORDER — EPINEPHRINE PF 1 MG/ML IJ SOLN
INTRAOCULAR | Status: DC | PRN
  Administered 2020-08-11: 82 mL via OPHTHALMIC

## 2020-08-11 MED ORDER — MIDAZOLAM HCL 2 MG/2ML IJ SOLN
INTRAMUSCULAR | Status: DC | PRN
  Administered 2020-08-11: 2 mg via INTRAVENOUS

## 2020-08-11 MED ORDER — LIDOCAINE HCL (PF) 2 % IJ SOLN
INTRAOCULAR | Status: DC | PRN
  Administered 2020-08-11: 1 mL

## 2020-08-11 MED ORDER — BRIMONIDINE TARTRATE-TIMOLOL 0.2-0.5 % OP SOLN
OPHTHALMIC | Status: DC | PRN
  Administered 2020-08-11: 1 [drp] via OPHTHALMIC

## 2020-08-11 MED ORDER — NA HYALUR & NA CHOND-NA HYALUR 0.4-0.35 ML IO KIT
PACK | INTRAOCULAR | Status: DC | PRN
  Administered 2020-08-11: 1 mL via INTRAOCULAR

## 2020-08-11 MED ORDER — MOXIFLOXACIN HCL 0.5 % OP SOLN
1.0000 [drp] | OPHTHALMIC | Status: DC | PRN
  Administered 2020-08-11 (×3): 1 [drp] via OPHTHALMIC

## 2020-08-11 MED ORDER — ARMC OPHTHALMIC DILATING DROPS
1.0000 "application " | OPHTHALMIC | Status: DC | PRN
  Administered 2020-08-11 (×3): 1 via OPHTHALMIC

## 2020-08-11 MED ORDER — LACTATED RINGERS IV SOLN: INTRAVENOUS | Status: DC

## 2020-08-11 SURGICAL SUPPLY — 23 items
CANNULA ANT/CHMB 27G (MISCELLANEOUS) ×1 IMPLANT
CANNULA ANT/CHMB 27GA (MISCELLANEOUS) ×2 IMPLANT
GLOVE SURG LX 7.5 STRW (GLOVE) ×1
GLOVE SURG LX STRL 7.5 STRW (GLOVE) ×1 IMPLANT
GLOVE SURG TRIUMPH 8.0 PF LTX (GLOVE) ×2 IMPLANT
GOWN STRL REUS W/ TWL LRG LVL3 (GOWN DISPOSABLE) ×2 IMPLANT
GOWN STRL REUS W/TWL LRG LVL3 (GOWN DISPOSABLE) ×4
LENS IOL DIOP 23.5 (Intraocular Lens) ×2 IMPLANT
LENS IOL TECNIS MONO 23.5 (Intraocular Lens) IMPLANT
MARKER SKIN DUAL TIP RULER LAB (MISCELLANEOUS) ×2 IMPLANT
NDL CAPSULORHEX 25GA (NEEDLE) ×1 IMPLANT
NDL FILTER BLUNT 18X1 1/2 (NEEDLE) ×2 IMPLANT
NEEDLE CAPSULORHEX 25GA (NEEDLE) ×2 IMPLANT
NEEDLE FILTER BLUNT 18X 1/2SAF (NEEDLE) ×2
NEEDLE FILTER BLUNT 18X1 1/2 (NEEDLE) ×2 IMPLANT
PACK CATARACT BRASINGTON (MISCELLANEOUS) ×2 IMPLANT
PACK EYE AFTER SURG (MISCELLANEOUS) ×2 IMPLANT
PACK OPTHALMIC (MISCELLANEOUS) ×2 IMPLANT
SOLUTION OPHTHALMIC SALT (MISCELLANEOUS) ×2 IMPLANT
SYR 3ML LL SCALE MARK (SYRINGE) ×4 IMPLANT
SYR TB 1ML LUER SLIP (SYRINGE) ×2 IMPLANT
WATER STERILE IRR 250ML POUR (IV SOLUTION) ×2 IMPLANT
WIPE NON LINTING 3.25X3.25 (MISCELLANEOUS) ×2 IMPLANT

## 2020-08-11 NOTE — Anesthesia Postprocedure Evaluation (Signed)
Anesthesia Post Note  Patient: [name] [name]  Procedure(s) Performed: CATARACT EXTRACTION PHACO AND INTRAOCULAR LENS PLACEMENT ([facility]) RIGHT DIABETIC (Right Eye)     Anesthesia Post Evaluation No complications documented.  Broedy Osbourne [employer]

## 2020-08-11 NOTE — Anesthesia Preprocedure Evaluation (Signed)
Anesthesia Evaluation  Patient identified by MRN, date of birth, ID band Patient awake    Reviewed: Allergy & Precautions, NPO status , Patient's Chart, lab work & pertinent test results, reviewed documented beta blocker date and time   Airway Mallampati: II  TM Distance: >3 FB Neck ROM: Full    Dental  (+) Upper Dentures, Partial Lower   Pulmonary sleep apnea and Continuous Positive Airway Pressure Ventilation , former smoker,    Pulmonary exam normal        Cardiovascular hypertension, Normal cardiovascular exam+ dysrhythmias (Last coumadin 07/20/2020) Atrial Fibrillation + pacemaker   ECHO 12/2018 - Left ventricle: The cavity size was normal. Wall thickness was increased in a pattern of mild LVH. Systolic function was normal. The estimated ejection fraction was in the range of 55% to 65%.  - Mitral valve: There was mild regurgitation.  - Left atrium: The atrium was mildly dilated.   Neuro/Psych Anxiety negative neurological ROS     GI/Hepatic GERD  Controlled,  Endo/Other  diabetes, Type 2Morbid obesity (BMI 41)  Renal/GU CRFRenal disease     Musculoskeletal  (+) Arthritis ,   Abdominal (+) + obese,   Peds  Hematology  (+) anemia ,   Anesthesia Other Findings   Reproductive/Obstetrics                             Anesthesia Physical  Anesthesia Plan  ASA: III  Anesthesia Plan: MAC   Post-op Pain Management:    Induction: Intravenous  PONV Risk Score and Plan: 2 and Ondansetron, TIVA, Midazolam and Treatment may vary due to age or medical condition  Airway Management Planned: Nasal Cannula and Natural Airway  Additional Equipment: None  Intra-op Plan:   Post-operative Plan:   Informed Consent: I have reviewed the patients History and Physical, chart, labs and discussed the procedure including the risks, benefits and alternatives for the proposed anesthesia with the patient or  authorized representative who has indicated his/her understanding and acceptance.     Dental advisory given  Plan Discussed with: CRNA  Anesthesia Plan Comments:         Anesthesia Quick Evaluation

## 2020-08-11 NOTE — Op Note (Signed)
LOCATION:  [place]   PREOPERATIVE DIAGNOSIS:    Nuclear sclerotic cataract right eye. H25.11   POSTOPERATIVE DIAGNOSIS:  Nuclear sclerotic cataract right eye.     PROCEDURE:  Phacoemusification with posterior chamber intraocular lens placement of the right eye   ULTRASOUND TIME: Procedure(s) with comments: CATARACT EXTRACTION PHACO AND INTRAOCULAR LENS PLACEMENT ([facility]) RIGHT DIABETIC (Right) - 11.63 1:25.0 13.7%  LENS:   Implant Name Type Inv. Item Serial No. Manufacturer Lot No. LRB No. Used Action  LENS IOL DIOP 23.5 - [id number] Intraocular Lens LENS IOL DIOP 23.5 [id number] [employer]  Right 1 Implanted         SURGEON:  Wyonia Hough, MD   ANESTHESIA:  Topical with tetracaine drops and 2% Xylocaine jelly, augmented with 1% preservative-free intracameral lidocaine.    COMPLICATIONS:  None.   DESCRIPTION OF PROCEDURE:  The patient was identified in the holding room and transported to the operating room and placed in the supine position under the operating microscope.  The right eye was identified as the operative eye and it was prepped and draped in the usual sterile ophthalmic fashion.   A 1 millimeter clear-corneal paracentesis was made at the 12:00 position.  0.5 ml of preservative-free 1% lidocaine was injected into the anterior chamber. The anterior chamber was filled with Viscoat viscoelastic.  A 2.4 millimeter keratome was used to make a near-clear corneal incision at the 9:00 position.  A curvilinear capsulorrhexis was made with a cystotome and capsulorrhexis forceps.  Balanced salt solution was used to hydrodissect and hydrodelineate the nucleus.   Phacoemulsification was then used in stop and chop fashion to remove the lens nucleus and epinucleus.  The remaining cortex was then removed using the irrigation and aspiration handpiece. Provisc was then placed into the capsular bag to distend it for lens placement.  A lens was then injected  into the capsular bag.  The remaining viscoelastic was aspirated.   Wounds were hydrated with balanced salt solution.  The anterior chamber was inflated to a physiologic pressure with balanced salt solution.  No wound leaks were noted. Cefuroxime 0.1 ml of a 10mg /ml solution was injected into the anterior chamber for a dose of 1 mg of intracameral antibiotic at the completion of the case.   Timolol and Brimonidine drops were applied to the eye.  The patient was taken to the recovery room in stable condition without complications of anesthesia or surgery.   Renai Lopata 08/11/2020, 10:39 AM

## 2020-08-11 NOTE — Transfer of Care (Signed)
Immediate Anesthesia Transfer of Care Note  Patient: [name] [name]  Procedure(s) Performed: CATARACT EXTRACTION PHACO AND INTRAOCULAR LENS PLACEMENT ([facility]) RIGHT DIABETIC (Right Eye)  Patient Location: PACU  Anesthesia Type: MAC  Level of Consciousness: awake, alert  and patient cooperative  Airway and Oxygen Therapy: Patient Spontanous Breathing and Patient connected to supplemental oxygen  Post-op Assessment: Post-op Vital signs reviewed, Patient's Cardiovascular Status Stable, Respiratory Function Stable, Patent Airway and No signs of Nausea or vomiting  Post-op Vital Signs: Reviewed and stable  Complications: No complications documented.

## 2020-08-11 NOTE — H&P (Signed)

## 2020-08-11 NOTE — Anesthesia Procedure Notes (Signed)
Procedure Name: MAC Date/Time: 08/11/2020 10:17 AM Performed by: Jeannene Patella, CRNA Pre-anesthesia Checklist: Patient identified, Emergency Drugs available, Suction available, Patient being monitored and Timeout performed Patient Re-evaluated:Patient Re-evaluated prior to induction Oxygen Delivery Method: Nasal cannula Preoxygenation: Pre-oxygenation with 100% oxygen

## 2020-08-12 ENCOUNTER — Encounter: Payer: Self-pay | Admitting: Ophthalmology

## 2020-08-26 ENCOUNTER — Other Ambulatory Visit: Payer: Self-pay

## 2020-08-26 ENCOUNTER — Inpatient Hospital Stay: Payer: PPO

## 2020-08-26 DIAGNOSIS — I129 Hypertensive chronic kidney disease with stage 1 through stage 4 chronic kidney disease, or unspecified chronic kidney disease: Secondary | ICD-10-CM | POA: Diagnosis not present

## 2020-08-26 DIAGNOSIS — D631 Anemia in chronic kidney disease: Secondary | ICD-10-CM

## 2020-08-26 DIAGNOSIS — D649 Anemia, unspecified: Secondary | ICD-10-CM

## 2020-08-26 DIAGNOSIS — Z79899 Other long term (current) drug therapy: Secondary | ICD-10-CM

## 2020-08-26 LAB — HEMOGLOBIN AND HEMATOCRIT, BLOOD
HCT: 32 % — ABNORMAL LOW (ref 36.0–46.0)
Hemoglobin: 11.1 g/dL — ABNORMAL LOW (ref 12.0–15.0)

## 2020-08-31 DIAGNOSIS — I10 Essential (primary) hypertension: Secondary | ICD-10-CM | POA: Diagnosis not present

## 2020-08-31 DIAGNOSIS — R791 Abnormal coagulation profile: Secondary | ICD-10-CM | POA: Diagnosis not present

## 2020-08-31 DIAGNOSIS — I495 Sick sinus syndrome: Secondary | ICD-10-CM | POA: Diagnosis not present

## 2020-08-31 DIAGNOSIS — E1122 Type 2 diabetes mellitus with diabetic chronic kidney disease: Secondary | ICD-10-CM | POA: Diagnosis not present

## 2020-08-31 DIAGNOSIS — G4733 Obstructive sleep apnea (adult) (pediatric): Secondary | ICD-10-CM | POA: Diagnosis not present

## 2020-08-31 DIAGNOSIS — I480 Paroxysmal atrial fibrillation: Secondary | ICD-10-CM | POA: Diagnosis not present

## 2020-08-31 DIAGNOSIS — Z9989 Dependence on other enabling machines and devices: Secondary | ICD-10-CM | POA: Diagnosis not present

## 2020-08-31 DIAGNOSIS — Z950 Presence of cardiac pacemaker: Secondary | ICD-10-CM | POA: Diagnosis not present

## 2020-08-31 DIAGNOSIS — I5032 Chronic diastolic (congestive) heart failure: Secondary | ICD-10-CM | POA: Diagnosis not present

## 2020-09-16 ENCOUNTER — Inpatient Hospital Stay: Payer: PPO

## 2020-09-16 ENCOUNTER — Other Ambulatory Visit: Payer: Self-pay

## 2020-09-16 ENCOUNTER — Inpatient Hospital Stay: Payer: PPO | Attending: Oncology

## 2020-09-16 DIAGNOSIS — N189 Chronic kidney disease, unspecified: Secondary | ICD-10-CM | POA: Diagnosis not present

## 2020-09-16 DIAGNOSIS — B351 Tinea unguium: Secondary | ICD-10-CM | POA: Diagnosis not present

## 2020-09-16 DIAGNOSIS — E1140 Type 2 diabetes mellitus with diabetic neuropathy, unspecified: Secondary | ICD-10-CM | POA: Diagnosis not present

## 2020-09-16 DIAGNOSIS — D631 Anemia in chronic kidney disease: Secondary | ICD-10-CM | POA: Diagnosis not present

## 2020-09-16 DIAGNOSIS — Z79899 Other long term (current) drug therapy: Secondary | ICD-10-CM

## 2020-09-16 DIAGNOSIS — D649 Anemia, unspecified: Secondary | ICD-10-CM

## 2020-09-16 DIAGNOSIS — N1831 Chronic kidney disease, stage 3a: Secondary | ICD-10-CM

## 2020-09-16 DIAGNOSIS — L851 Acquired keratosis [keratoderma] palmaris et plantaris: Secondary | ICD-10-CM | POA: Diagnosis not present

## 2020-09-16 DIAGNOSIS — E042 Nontoxic multinodular goiter: Secondary | ICD-10-CM | POA: Diagnosis not present

## 2020-09-16 DIAGNOSIS — E1122 Type 2 diabetes mellitus with diabetic chronic kidney disease: Secondary | ICD-10-CM | POA: Diagnosis not present

## 2020-09-16 DIAGNOSIS — N184 Chronic kidney disease, stage 4 (severe): Secondary | ICD-10-CM | POA: Diagnosis not present

## 2020-09-16 LAB — HEMOGLOBIN AND HEMATOCRIT, BLOOD
HCT: 33.2 % — ABNORMAL LOW (ref 36.0–46.0)
Hemoglobin: 11.1 g/dL — ABNORMAL LOW (ref 12.0–15.0)

## 2020-09-16 NOTE — Progress Notes (Unsigned)
Pt hgb was 11.1 and she did not need her retacrit inj. today

## 2020-09-17 DIAGNOSIS — I10 Essential (primary) hypertension: Secondary | ICD-10-CM | POA: Diagnosis not present

## 2020-09-17 DIAGNOSIS — N2581 Secondary hyperparathyroidism of renal origin: Secondary | ICD-10-CM | POA: Diagnosis not present

## 2020-09-17 DIAGNOSIS — E1122 Type 2 diabetes mellitus with diabetic chronic kidney disease: Secondary | ICD-10-CM | POA: Diagnosis not present

## 2020-09-17 DIAGNOSIS — D631 Anemia in chronic kidney disease: Secondary | ICD-10-CM | POA: Diagnosis not present

## 2020-09-17 DIAGNOSIS — N184 Chronic kidney disease, stage 4 (severe): Secondary | ICD-10-CM | POA: Diagnosis not present

## 2020-09-29 DIAGNOSIS — N184 Chronic kidney disease, stage 4 (severe): Secondary | ICD-10-CM | POA: Diagnosis not present

## 2020-09-29 DIAGNOSIS — I480 Paroxysmal atrial fibrillation: Secondary | ICD-10-CM | POA: Diagnosis not present

## 2020-09-29 DIAGNOSIS — E1122 Type 2 diabetes mellitus with diabetic chronic kidney disease: Secondary | ICD-10-CM | POA: Diagnosis not present

## 2020-09-30 DIAGNOSIS — H524 Presbyopia: Secondary | ICD-10-CM | POA: Diagnosis not present

## 2020-09-30 DIAGNOSIS — H43823 Vitreomacular adhesion, bilateral: Secondary | ICD-10-CM | POA: Diagnosis not present

## 2020-10-05 DIAGNOSIS — G4733 Obstructive sleep apnea (adult) (pediatric): Secondary | ICD-10-CM | POA: Diagnosis not present

## 2020-10-06 DIAGNOSIS — R791 Abnormal coagulation profile: Secondary | ICD-10-CM | POA: Diagnosis not present

## 2020-10-06 DIAGNOSIS — E042 Nontoxic multinodular goiter: Secondary | ICD-10-CM | POA: Diagnosis not present

## 2020-10-07 ENCOUNTER — Inpatient Hospital Stay: Payer: PPO

## 2020-10-07 ENCOUNTER — Inpatient Hospital Stay: Payer: PPO | Attending: Oncology

## 2020-10-07 DIAGNOSIS — E785 Hyperlipidemia, unspecified: Secondary | ICD-10-CM | POA: Insufficient documentation

## 2020-10-07 DIAGNOSIS — E1122 Type 2 diabetes mellitus with diabetic chronic kidney disease: Secondary | ICD-10-CM | POA: Insufficient documentation

## 2020-10-07 DIAGNOSIS — N183 Chronic kidney disease, stage 3 unspecified: Secondary | ICD-10-CM | POA: Insufficient documentation

## 2020-10-07 DIAGNOSIS — D631 Anemia in chronic kidney disease: Secondary | ICD-10-CM

## 2020-10-07 DIAGNOSIS — D649 Anemia, unspecified: Secondary | ICD-10-CM

## 2020-10-07 DIAGNOSIS — I130 Hypertensive heart and chronic kidney disease with heart failure and stage 1 through stage 4 chronic kidney disease, or unspecified chronic kidney disease: Secondary | ICD-10-CM | POA: Diagnosis not present

## 2020-10-07 DIAGNOSIS — N189 Chronic kidney disease, unspecified: Secondary | ICD-10-CM | POA: Diagnosis present

## 2020-10-07 DIAGNOSIS — N1831 Chronic kidney disease, stage 3a: Secondary | ICD-10-CM

## 2020-10-07 DIAGNOSIS — Z79899 Other long term (current) drug therapy: Secondary | ICD-10-CM

## 2020-10-07 LAB — HEMOGLOBIN AND HEMATOCRIT, BLOOD
HCT: 32.5 % — ABNORMAL LOW (ref 36.0–46.0)
Hemoglobin: 11.1 g/dL — ABNORMAL LOW (ref 12.0–15.0)

## 2020-10-13 DIAGNOSIS — N184 Chronic kidney disease, stage 4 (severe): Secondary | ICD-10-CM | POA: Diagnosis not present

## 2020-10-13 DIAGNOSIS — E1122 Type 2 diabetes mellitus with diabetic chronic kidney disease: Secondary | ICD-10-CM | POA: Diagnosis not present

## 2020-10-13 DIAGNOSIS — E042 Nontoxic multinodular goiter: Secondary | ICD-10-CM | POA: Diagnosis not present

## 2020-10-20 DIAGNOSIS — I480 Paroxysmal atrial fibrillation: Secondary | ICD-10-CM | POA: Diagnosis not present

## 2020-10-26 ENCOUNTER — Inpatient Hospital Stay (HOSPITAL_BASED_OUTPATIENT_CLINIC_OR_DEPARTMENT_OTHER): Payer: PPO | Admitting: Oncology

## 2020-10-26 ENCOUNTER — Inpatient Hospital Stay: Payer: PPO

## 2020-10-26 VITALS — BP 139/67 | HR 73 | Temp 96.5°F | Wt 248.4 lb

## 2020-10-26 DIAGNOSIS — D649 Anemia, unspecified: Secondary | ICD-10-CM

## 2020-10-26 DIAGNOSIS — N189 Chronic kidney disease, unspecified: Secondary | ICD-10-CM

## 2020-10-26 DIAGNOSIS — Z79899 Other long term (current) drug therapy: Secondary | ICD-10-CM | POA: Diagnosis not present

## 2020-10-26 DIAGNOSIS — D631 Anemia in chronic kidney disease: Secondary | ICD-10-CM

## 2020-10-26 DIAGNOSIS — N183 Chronic kidney disease, stage 3 unspecified: Secondary | ICD-10-CM | POA: Diagnosis not present

## 2020-10-26 DIAGNOSIS — R791 Abnormal coagulation profile: Secondary | ICD-10-CM | POA: Diagnosis not present

## 2020-10-26 DIAGNOSIS — N1831 Anemia in chronic kidney disease: Secondary | ICD-10-CM

## 2020-10-26 DIAGNOSIS — D509 Iron deficiency anemia, unspecified: Secondary | ICD-10-CM

## 2020-10-26 LAB — CBC WITH DIFFERENTIAL/PLATELET
Abs Immature Granulocytes: 0.03 10*3/uL (ref 0.00–0.07)
Basophils Absolute: 0 10*3/uL (ref 0.0–0.1)
Basophils Relative: 1 %
Eosinophils Absolute: 0.1 10*3/uL (ref 0.0–0.5)
Eosinophils Relative: 2 %
HCT: 31.2 % — ABNORMAL LOW (ref 36.0–46.0)
Hemoglobin: 10.7 g/dL — ABNORMAL LOW (ref 12.0–15.0)
Immature Granulocytes: 1 %
Lymphocytes Relative: 21 %
Lymphs Abs: 1.3 10*3/uL (ref 0.7–4.0)
MCH: 27.4 pg (ref 26.0–34.0)
MCHC: 34.3 g/dL (ref 30.0–36.0)
MCV: 79.8 fL — ABNORMAL LOW (ref 80.0–100.0)
Monocytes Absolute: 0.6 10*3/uL (ref 0.1–1.0)
Monocytes Relative: 11 %
Neutro Abs: 3.9 10*3/uL (ref 1.7–7.7)
Neutrophils Relative %: 64 %
Platelets: 133 10*3/uL — ABNORMAL LOW (ref 150–400)
RBC: 3.91 MIL/uL (ref 3.87–5.11)
RDW: 14.7 % (ref 11.5–15.5)
WBC: 6 10*3/uL (ref 4.0–10.5)
nRBC: 0 % (ref 0.0–0.2)

## 2020-10-26 LAB — IRON AND TIBC
Iron: 49 ug/dL (ref 28–170)
Saturation Ratios: 18 % (ref 10.4–31.8)
TIBC: 280 ug/dL (ref 250–450)
UIBC: 231 ug/dL

## 2020-10-26 LAB — FERRITIN: Ferritin: 130 ng/mL (ref 11–307)

## 2020-10-26 MED ORDER — EPOETIN ALFA-EPBX 40000 UNIT/ML IJ SOLN
40000.0000 [IU] | INTRAMUSCULAR | Status: AC
  Administered 2020-10-26: 40000 [IU] via SUBCUTANEOUS
  Filled 2020-10-26: qty 1

## 2020-10-26 NOTE — Progress Notes (Signed)
Hematology/Oncology Consult note [facility]  Telephone:([phone] [phone]  Patient Care Team: Dion Body, MD as PCP - General (Family Medicine) Minna Merritts, MD as Consulting Physician (Cardiology) Anthonette Legato, MD as Consulting Physician (Nephrology) Mapp Lance, MD as Consulting Physician (Cardiology) Lorelee Cover., MD (Ophthalmology) Edrick Kins, DPM as Consulting Physician (Podiatry)   Name of the patient: [name] [name]  [id number]  Oct 19, 1935   Date of visit: 10/26/20  Diagnosis- chronic anemia multifactorial secondary to anemia of chronic kidney disease and a component of iron deficiency    Chief complaint/ Reason for visit-routine follow-up of anemia for possible Retacrit  Heme/Onc history: patient is a85 year old female with a past medical history significant for hypertension and stage III chronic kidney disease among other medical problems. She also has hypertension hyperlipidemia and type 2 diabetes. She was recently found by Dr. Clayton Bibles and was noted to have a hemoglobin of 9.4. Iron studies also revealed a ferritin of 22, iron saturation 13%. TIBC was normal at 302 on serum iron was low normal at 39 white count and platelets within normal limits. She has been referred to [place] for iron deficiency anemia. She has been seen by Dr. Remi Haggard GI the past and had a colonoscopy in 2017 which did not reveal any evidence of malignancy. 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 Grayland Ormond in September 2017  Blood work from 05/24/2017 was as follows: CBC showed white count of 7.8, H&H of 10.2/30.2 with an MCV of 68.1 and a platelet count of 199. Ferritin levels were low at 14. Vitamin B12 is worse normal at 947. Folate was normal. Haptoglobin and TSH was normal. Iron studies were within normal limits. ESR  was mildly elevated at 56. Multiple myeloma panel did not reveal any monoclonal protein. H. pylori stool antigen was negative.   Interval history-reports doing well and denies any complaints at this time.  Energy levels are good  ECOG PS- 1 Pain scale- 0   Review of systems- Review of Systems  Constitutional: Negative for chills, fever, malaise/fatigue and weight loss.  HENT: Negative for congestion, ear discharge and nosebleeds.   Eyes: Negative for blurred vision.  Respiratory: Negative for cough, hemoptysis, sputum production, shortness of breath and wheezing.   Cardiovascular: Negative for chest pain, palpitations, orthopnea and claudication.  Gastrointestinal: Negative for abdominal pain, blood in stool, constipation, diarrhea, heartburn, melena, nausea and vomiting.  Genitourinary: Negative for dysuria, flank pain, frequency, hematuria and urgency.  Musculoskeletal: Negative for back pain, joint pain and myalgias.  Skin: Negative for rash.  Neurological: Negative for dizziness, tingling, focal weakness, seizures, weakness and headaches.  Endo/Heme/Allergies: Does not bruise/bleed easily.  Psychiatric/Behavioral: Negative for depression and suicidal ideas. The patient does not have insomnia.       Allergies  Allergen Reactions  . Metoprolol Other (See Comments)    Bad dreams Patient stated that she had suicidal thoughts while taking this medication.   . Diltiazem Other (See Comments)    Patient stated that she had bad dreams with this medication  . Levofloxacin Nausea And Vomiting and Nausea Only          Past Medical History:  Diagnosis Date  . 1st degree AV block    a. PR 400 msec with Apacing  . A-fib (HCC)    Hx of  . Anemia   . Anxiety   .  Arthritis    knees and lower back   . Chest pain    a. 2004 Cath:  reportedly nl;  b. 09/2013 Neg MV.  . Chronic diastolic heart failure ([place])    a. 09/2013 Echo: EF 50-55%, mild LVH, mild to mod MR/TR.  Marland Kitchen Chronic  kidney disease    Stage IV  . Congestive heart disease ([place])   . Esophageal reflux   . Gout   . H/O hiatal hernia   . History of UTI   . Hyperlipidemia   . Morbid obesity ([place])   . OSA on CPAP    CPAP  . Pacemaker    copy of medtronic card in chart  . PAF (paroxysmal atrial fibrillation) ([place])   . Seasonal allergies   . SSS (sick sinus syndrome) ([place])    a. 2008 s/p MDT PPM;  b. 11/2013 Gen change- MDT ADDRL1 Adapta DC PPM, ser # [id number] H.  . Symptomatic PVCs   . Syncope and collapse    a. Felt to be vasovagal.  . Thyroid disease    nodules on thyroid, with goiter, on no meds per pt  . Type II diabetes mellitus (HCC)    Type 2  . Unspecified essential hypertension    controlled on meds  . Unspecified glaucoma(365.9)   . Vertigo    in past  . Wears dentures    upper full and lower partial     Past Surgical History:  Procedure Laterality Date  . APPENDECTOMY    . CARDIAC CATHETERIZATION    . CARDIOVERSION N/A 04/26/2017   Procedure: Cardioversion;  Surgeon: Minna Merritts, MD;  Location: [facility] ORS;  Service: Cardiovascular;  Laterality: N/A;  . CATARACT EXTRACTION W/PHACO Left 07/21/2020   Procedure: CATARACT EXTRACTION PHACO AND INTRAOCULAR LENS PLACEMENT ([place]) LEFT DIABETIC;  Surgeon: Leandrew Koyanagi, MD;  Location: [place];  Service: Ophthalmology;  Laterality: Left;  10.25 1:13.2 14.0%  . CATARACT EXTRACTION W/PHACO Right 08/11/2020   Procedure: CATARACT EXTRACTION PHACO AND INTRAOCULAR LENS PLACEMENT ([place]) RIGHT DIABETIC;  Surgeon: Leandrew Koyanagi, MD;  Location: [place];  Service: Ophthalmology;  Laterality: Right;  11.63 1:25.0 13.7%  . DILATION AND CURETTAGE OF UTERUS    . INCISION AND DRAINAGE OF WOUND Right 03/1998   "leg; it was like a boil" (12/01/2013)  . INSERT / REPLACE / REMOVE PACEMAKER  2008; 12/01/2013   MDT ADDRL1 pacemaker - gen change by Dr Caryl Comes 12/01/2013  . LUNG BIOPSY  2010   [facility]  . PERMANENT PACEMAKER  GENERATOR CHANGE N/A 12/01/2013   Procedure: PERMANENT PACEMAKER GENERATOR CHANGE;  Surgeon: Deboraha Sprang, MD;  Location: [facility] CATH LAB;  Service: Cardiovascular;  Laterality: N/A;  . VAGINAL HYSTERECTOMY  1970    Social History   Socioeconomic History  . Marital status: Widowed    Spouse name: Not on file  . Number of children: Y  . Years of education: Not on file  . Highest education level: Not on file  Occupational History  . Occupation: retired  Tobacco Use  . Smoking status: Former Smoker    Packs/day: 0.50    Years: 34.00    Pack years: 17.00    Types: Cigarettes    Quit date: 08/15/1989    Years since quitting: 31.2  . Smokeless tobacco: Never Used  Vaping Use  . Vaping Use: Never used  Substance and Sexual Activity  . Alcohol use: No    Alcohol/week: 0.0 standard drinks  . Drug use: No  .  Sexual activity: Never  Other Topics Concern  . Not on file  Social History Narrative   Retired. Widowed. Regularly exercises.    Social Determinants of Health   Financial Resource Strain:   . Difficulty of Paying Living Expenses: Not on file  Food Insecurity:   . Worried About Charity fundraiser in the Last Year: Not on file  . Ran Out of Food in the Last Year: Not on file  Transportation Needs:   . Lack of Transportation (Medical): Not on file  . Lack of Transportation (Non-Medical): Not on file  Physical Activity:   . Days of Exercise per Week: Not on file  . Minutes of Exercise per Session: Not on file  Stress:   . Feeling of Stress : Not on file  Social Connections:   . Frequency of Communication with Friends and Family: Not on file  . Frequency of Social Gatherings with Friends and Family: Not on file  . Attends Religious Services: Not on file  . Active Member of Clubs or Organizations: Not on file  . Attends Archivist Meetings: Not on file  . Marital Status: Not on file  Intimate Partner Violence:   . Fear of Current or Ex-Partner: Not on file  .  Emotionally Abused: Not on file  . Physically Abused: Not on file  . Sexually Abused: Not on file    Family History  Problem Relation Age of Onset  . Heart disease Father   . Rheum arthritis Father   . Allergies Father   . Liver cancer Mother   . Pancreatic cancer Mother   . Hypertension Sister   . Rheum arthritis Sister   . Breast cancer Maternal Aunt      Current Outpatient Medications:  .  acetaminophen (TYLENOL) 500 MG tablet, Take 1,000 mg by mouth every 6 (six) hours as needed for moderate pain. , Disp: , Rfl:  .  allopurinol (ZYLOPRIM) 100 MG tablet, Take 100 mg by mouth daily. , Disp: , Rfl:  .  amiodarone (PACERONE) 200 MG tablet, Take 100 mg by mouth 2 (two) times daily., Disp: , Rfl:  .  amLODipine (NORVASC) 10 MG tablet, TAKE 1 TABLET (10 MG TOTAL) BY MOUTH DAILY. (Patient taking differently: Take 5 mg by mouth daily. ), Disp: 90 tablet, Rfl: 0 .  atorvastatin (LIPITOR) 20 MG tablet, TAKE 1 TABLET (20 MG TOTAL) BY MOUTH DAILY. (Patient taking differently: Take 20 mg by mouth at bedtime. ), Disp: 90 tablet, Rfl: 0 .  carvedilol (COREG) 25 MG tablet, Take 25-50 mg by mouth See admin instructions. Take 1 tablet (25MG) by mouth every morning and 2 tablets (50MG) by mouth every night before bed, Disp: , Rfl:  .  diclofenac sodium (VOLTAREN) 1 % GEL, Apply 2 g topically 4 (four) times daily., Disp: , Rfl:  .  ferrous sulfate 325 (65 FE) MG EC tablet, Take 325 mg by mouth daily. , Disp: , Rfl:  .  furosemide (LASIX) 20 MG tablet, Take 1 tablet (20 mg total) by mouth daily., Disp: 90 tablet, Rfl: 0 .  glipiZIDE (GLUCOTROL) 10 MG tablet, TAKE 1 TABLET (10 MG TOTAL) BY MOUTH 2 (TWO) TIMES DAILY BEFORE A MEAL., Disp: 180 tablet, Rfl: 0 .  Glycerin, Laxative, (FLEET LIQUID GLYCERIN SUPP RE), Place 1 Dose rectally daily as needed (constipation)., Disp: , Rfl:  .  KLOR-CON M10 10 MEQ tablet, Take 1 tablet by mouth daily., Disp: , Rfl: 3 .  mometasone (ELOCON) 0.1 %  cream, Apply topically  daily. Apply to affected area as directed two times a day (Patient taking differently: Apply topically daily. Apply to affected area as directed two times a day as needed), Disp: 45 g, Rfl: 0 .  Multiple Vitamins-Minerals (CENTRUM SILVER PO), Take 1 tablet by mouth daily. , Disp: , Rfl:  .  olmesartan (BENICAR) 40 MG tablet, TAKE 1 TABLET (40 MG TOTAL) BY MOUTH DAILY., Disp: 90 tablet, Rfl: 0 .  pantoprazole (PROTONIX) 40 MG tablet, Take 40 mg by mouth daily., Disp: , Rfl:  .  polyethylene glycol (MIRALAX / GLYCOLAX) packet, Take 17 g by mouth daily as needed for moderate constipation., Disp: , Rfl:  .  TRADJENTA 5 MG TABS tablet, TAKE 1 TABLET (5 MG TOTAL) BY MOUTH DAILY. (Patient taking differently: TAKE 1 TABLET (5 MG TOTAL) BY MOUTH DAILY WITH BREAKFAST), Disp: 30 tablet, Rfl: 2 .  warfarin (COUMADIN) 3 MG tablet, TAKE 1-1.5 TABLETS DAILY AT 6 PM. TAKE 1 TAB ON TUES, THURS, SAT, SUN. TAKE 4.5 MG ON MON, WED, FRI (Patient taking differently: 3 mg Monday-Saturday, 48m on Sunday), Disp: 100 tablet, Rfl: 3 .  Brinzolamide-Brimonidine (SIMBRINZA) 1-0.2 % SUSP, Place 1 drop into both eyes 2 (two) times daily. (Patient not taking: Reported on 10/26/2020), Disp: 1 Bottle, Rfl: 1 .  travoprost, benzalkonium, (TRAVATAN) 0.004 % ophthalmic solution, Place 1 drop into both eyes at bedtime.  (Patient not taking: Reported on 10/26/2020), Disp: , Rfl:  No current facility-administered medications for this visit.  Facility-Administered Medications Ordered in Other Visits:  .  epoetin alfa-epbx (RETACRIT) injection 40,000 Units, 40,000 Units, Subcutaneous, Weekly, RSindy Guadeloupe MD  Physical exam:  Vitals:   10/26/20 1001  BP: 139/67  Pulse: 73  Temp: (!) 96.5 F (35.8 C)  TempSrc: Tympanic  SpO2: 100%  Weight: 248 lb 6.4 oz (112.7 kg)   Physical Exam Constitutional:      General: She is not in acute distress. Cardiovascular:     Rate and Rhythm: Normal rate and regular rhythm.     Heart sounds:  Normal heart sounds.  Pulmonary:     Effort: Pulmonary effort is normal.     Breath sounds: Normal breath sounds.  Abdominal:     General: Bowel sounds are normal.     Palpations: Abdomen is soft.  Skin:    General: Skin is warm and dry.  Neurological:     Mental Status: She is alert and oriented to person, place, and time.      CMP Latest Ref Rng & Units 02/06/2020  Glucose 70 - 99 mg/dL 182(H)  BUN 8 - 23 mg/dL 33(H)  Creatinine 0.44 - 1.00 mg/dL 2.89(H)  Sodium 135 - 145 mmol/L 140  Potassium 3.5 - 5.1 mmol/L 4.2  Chloride 98 - 111 mmol/L 106  CO2 22 - 32 mmol/L 24  Calcium 8.9 - 10.3 mg/dL 9.3  Total Protein 6.5 - 8.1 g/dL 6.4(L)  Total Bilirubin 0.3 - 1.2 mg/dL 0.7  Alkaline Phos 38 - 126 U/L 76  AST 15 - 41 U/L 22  ALT 0 - 44 U/L 18   CBC Latest Ref Rng & Units 10/26/2020  WBC 4.0 - 10.5 K/uL 6.0  Hemoglobin 12.0 - 15.0 g/dL 10.7(L)  Hematocrit 36 - 46 % 31.2(L)  Platelets 150 - 400 K/uL 133(L)      Assessment and plan- Patient is a 85y.o. female here for routine follow-up of anemia of chronic kidney disease  Hemoglobin is 10.7 today and okay  to proceed with Retacrit.  She will continue to get Retacrit every 3 weeks and I will see her back in 3 months with CBC ferritin and iron studies.Her iron studies are presently normal and she does not require any IV iron at this time   Visit Diagnosis 1. Iron deficiency anemia, unspecified iron deficiency anemia type   2. Anemia of chronic renal failure, unspecified CKD stage   3. Erythropoietin (EPO) stimulating agent anemia management patient      Dr. Randa Evens, MD, MPH [facility] [phone] 10/26/2020 10:51 AM

## 2020-11-09 DIAGNOSIS — E7800 Pure hypercholesterolemia, unspecified: Secondary | ICD-10-CM | POA: Diagnosis not present

## 2020-11-09 DIAGNOSIS — I480 Paroxysmal atrial fibrillation: Secondary | ICD-10-CM | POA: Diagnosis not present

## 2020-11-16 ENCOUNTER — Inpatient Hospital Stay: Payer: PPO

## 2020-11-16 ENCOUNTER — Other Ambulatory Visit: Payer: Self-pay

## 2020-11-16 ENCOUNTER — Inpatient Hospital Stay: Payer: PPO | Attending: Oncology

## 2020-11-16 DIAGNOSIS — D631 Anemia in chronic kidney disease: Secondary | ICD-10-CM | POA: Insufficient documentation

## 2020-11-16 DIAGNOSIS — N189 Chronic kidney disease, unspecified: Secondary | ICD-10-CM | POA: Insufficient documentation

## 2020-11-16 DIAGNOSIS — D649 Anemia, unspecified: Secondary | ICD-10-CM

## 2020-11-16 DIAGNOSIS — Z79899 Other long term (current) drug therapy: Secondary | ICD-10-CM

## 2020-11-16 LAB — CBC WITH DIFFERENTIAL/PLATELET
Abs Immature Granulocytes: 0.02 10*3/uL (ref 0.00–0.07)
Basophils Absolute: 0 10*3/uL (ref 0.0–0.1)
Basophils Relative: 1 %
Eosinophils Absolute: 0.1 10*3/uL (ref 0.0–0.5)
Eosinophils Relative: 2 %
HCT: 32.8 % — ABNORMAL LOW (ref 36.0–46.0)
Hemoglobin: 11.1 g/dL — ABNORMAL LOW (ref 12.0–15.0)
Immature Granulocytes: 0 %
Lymphocytes Relative: 21 %
Lymphs Abs: 1.3 10*3/uL (ref 0.7–4.0)
MCH: 27.3 pg (ref 26.0–34.0)
MCHC: 33.8 g/dL (ref 30.0–36.0)
MCV: 80.6 fL (ref 80.0–100.0)
Monocytes Absolute: 0.5 10*3/uL (ref 0.1–1.0)
Monocytes Relative: 9 %
Neutro Abs: 4.2 10*3/uL (ref 1.7–7.7)
Neutrophils Relative %: 67 %
Platelets: 154 10*3/uL (ref 150–400)
RBC: 4.07 MIL/uL (ref 3.87–5.11)
RDW: 15 % (ref 11.5–15.5)
WBC: 6.2 10*3/uL (ref 4.0–10.5)
nRBC: 0 % (ref 0.0–0.2)

## 2020-11-16 LAB — IRON AND TIBC
Iron: 60 ug/dL (ref 28–170)
Saturation Ratios: 20 % (ref 10.4–31.8)
TIBC: 295 ug/dL (ref 250–450)
UIBC: 235 ug/dL

## 2020-11-16 LAB — FERRITIN: Ferritin: 101 ng/mL (ref 11–307)

## 2020-11-16 LAB — VITAMIN B12: Vitamin B-12: 982 pg/mL — ABNORMAL HIGH (ref 180–914)

## 2020-11-18 DIAGNOSIS — E1122 Type 2 diabetes mellitus with diabetic chronic kidney disease: Secondary | ICD-10-CM | POA: Diagnosis not present

## 2020-11-18 DIAGNOSIS — F411 Generalized anxiety disorder: Secondary | ICD-10-CM | POA: Diagnosis not present

## 2020-11-18 DIAGNOSIS — E7800 Pure hypercholesterolemia, unspecified: Secondary | ICD-10-CM | POA: Diagnosis not present

## 2020-11-18 DIAGNOSIS — I10 Essential (primary) hypertension: Secondary | ICD-10-CM | POA: Diagnosis not present

## 2020-11-18 DIAGNOSIS — N184 Chronic kidney disease, stage 4 (severe): Secondary | ICD-10-CM | POA: Diagnosis not present

## 2020-11-18 DIAGNOSIS — I5032 Chronic diastolic (congestive) heart failure: Secondary | ICD-10-CM | POA: Diagnosis not present

## 2020-11-18 DIAGNOSIS — L309 Dermatitis, unspecified: Secondary | ICD-10-CM | POA: Diagnosis not present

## 2020-11-19 DIAGNOSIS — D631 Anemia in chronic kidney disease: Secondary | ICD-10-CM | POA: Diagnosis not present

## 2020-11-19 DIAGNOSIS — N184 Chronic kidney disease, stage 4 (severe): Secondary | ICD-10-CM | POA: Diagnosis not present

## 2020-11-19 DIAGNOSIS — I10 Essential (primary) hypertension: Secondary | ICD-10-CM | POA: Diagnosis not present

## 2020-11-19 DIAGNOSIS — N2581 Secondary hyperparathyroidism of renal origin: Secondary | ICD-10-CM | POA: Diagnosis not present

## 2020-11-19 DIAGNOSIS — E1122 Type 2 diabetes mellitus with diabetic chronic kidney disease: Secondary | ICD-10-CM | POA: Diagnosis not present

## 2020-11-23 DIAGNOSIS — I480 Paroxysmal atrial fibrillation: Secondary | ICD-10-CM | POA: Diagnosis not present

## 2020-11-25 DIAGNOSIS — H40003 Preglaucoma, unspecified, bilateral: Secondary | ICD-10-CM | POA: Diagnosis not present

## 2020-11-25 DIAGNOSIS — Z961 Presence of intraocular lens: Secondary | ICD-10-CM | POA: Diagnosis not present

## 2020-12-07 ENCOUNTER — Inpatient Hospital Stay: Payer: HMO

## 2020-12-07 ENCOUNTER — Inpatient Hospital Stay: Payer: HMO | Attending: Oncology

## 2020-12-07 DIAGNOSIS — D649 Anemia, unspecified: Secondary | ICD-10-CM

## 2020-12-07 DIAGNOSIS — N1831 Chronic kidney disease, stage 3a: Secondary | ICD-10-CM

## 2020-12-07 DIAGNOSIS — N189 Chronic kidney disease, unspecified: Secondary | ICD-10-CM | POA: Insufficient documentation

## 2020-12-07 DIAGNOSIS — D631 Anemia in chronic kidney disease: Secondary | ICD-10-CM | POA: Diagnosis not present

## 2020-12-07 DIAGNOSIS — Z79899 Other long term (current) drug therapy: Secondary | ICD-10-CM

## 2020-12-07 LAB — HEMOGLOBIN AND HEMATOCRIT, BLOOD
HCT: 33.4 % — ABNORMAL LOW (ref 36.0–46.0)
Hemoglobin: 11.5 g/dL — ABNORMAL LOW (ref 12.0–15.0)

## 2020-12-14 DIAGNOSIS — I495 Sick sinus syndrome: Secondary | ICD-10-CM | POA: Diagnosis not present

## 2020-12-21 DIAGNOSIS — I480 Paroxysmal atrial fibrillation: Secondary | ICD-10-CM | POA: Diagnosis not present

## 2020-12-28 ENCOUNTER — Inpatient Hospital Stay: Payer: HMO

## 2020-12-28 DIAGNOSIS — D631 Anemia in chronic kidney disease: Secondary | ICD-10-CM

## 2020-12-28 DIAGNOSIS — N189 Chronic kidney disease, unspecified: Secondary | ICD-10-CM | POA: Diagnosis not present

## 2020-12-28 DIAGNOSIS — N1831 Chronic kidney disease, stage 3a: Secondary | ICD-10-CM

## 2020-12-28 DIAGNOSIS — Z79899 Other long term (current) drug therapy: Secondary | ICD-10-CM

## 2020-12-28 DIAGNOSIS — D649 Anemia, unspecified: Secondary | ICD-10-CM

## 2020-12-28 LAB — HEMOGLOBIN AND HEMATOCRIT, BLOOD
HCT: 33.2 % — ABNORMAL LOW (ref 36.0–46.0)
Hemoglobin: 11.4 g/dL — ABNORMAL LOW (ref 12.0–15.0)

## 2021-01-04 DIAGNOSIS — I480 Paroxysmal atrial fibrillation: Secondary | ICD-10-CM | POA: Diagnosis not present

## 2021-01-12 DIAGNOSIS — R791 Abnormal coagulation profile: Secondary | ICD-10-CM | POA: Diagnosis not present

## 2021-01-12 DIAGNOSIS — E1140 Type 2 diabetes mellitus with diabetic neuropathy, unspecified: Secondary | ICD-10-CM | POA: Diagnosis not present

## 2021-01-12 DIAGNOSIS — B351 Tinea unguium: Secondary | ICD-10-CM | POA: Diagnosis not present

## 2021-01-12 DIAGNOSIS — L851 Acquired keratosis [keratoderma] palmaris et plantaris: Secondary | ICD-10-CM | POA: Diagnosis not present

## 2021-01-16 ENCOUNTER — Other Ambulatory Visit: Payer: Self-pay | Admitting: *Deleted

## 2021-01-16 DIAGNOSIS — D631 Anemia in chronic kidney disease: Secondary | ICD-10-CM

## 2021-01-16 DIAGNOSIS — N1831 Chronic kidney disease, stage 3a: Secondary | ICD-10-CM

## 2021-01-17 DIAGNOSIS — E1122 Type 2 diabetes mellitus with diabetic chronic kidney disease: Secondary | ICD-10-CM | POA: Diagnosis not present

## 2021-01-17 DIAGNOSIS — D631 Anemia in chronic kidney disease: Secondary | ICD-10-CM | POA: Diagnosis not present

## 2021-01-17 DIAGNOSIS — N2581 Secondary hyperparathyroidism of renal origin: Secondary | ICD-10-CM | POA: Diagnosis not present

## 2021-01-17 DIAGNOSIS — I10 Essential (primary) hypertension: Secondary | ICD-10-CM | POA: Diagnosis not present

## 2021-01-18 ENCOUNTER — Inpatient Hospital Stay: Payer: HMO | Attending: Oncology

## 2021-01-18 ENCOUNTER — Encounter: Payer: Self-pay | Admitting: Oncology

## 2021-01-18 ENCOUNTER — Inpatient Hospital Stay (HOSPITAL_BASED_OUTPATIENT_CLINIC_OR_DEPARTMENT_OTHER): Payer: HMO | Admitting: Oncology

## 2021-01-18 ENCOUNTER — Inpatient Hospital Stay: Payer: HMO

## 2021-01-18 VITALS — BP 139/72 | HR 66 | Temp 97.4°F | Resp 18 | Wt 254.8 lb

## 2021-01-18 DIAGNOSIS — I10 Essential (primary) hypertension: Secondary | ICD-10-CM | POA: Diagnosis not present

## 2021-01-18 DIAGNOSIS — N1831 Chronic kidney disease, stage 3a: Secondary | ICD-10-CM | POA: Diagnosis not present

## 2021-01-18 DIAGNOSIS — D631 Anemia in chronic kidney disease: Secondary | ICD-10-CM | POA: Insufficient documentation

## 2021-01-18 DIAGNOSIS — R791 Abnormal coagulation profile: Secondary | ICD-10-CM | POA: Diagnosis not present

## 2021-01-18 DIAGNOSIS — N184 Chronic kidney disease, stage 4 (severe): Secondary | ICD-10-CM | POA: Diagnosis not present

## 2021-01-18 DIAGNOSIS — D509 Iron deficiency anemia, unspecified: Secondary | ICD-10-CM

## 2021-01-18 DIAGNOSIS — E7800 Pure hypercholesterolemia, unspecified: Secondary | ICD-10-CM | POA: Diagnosis not present

## 2021-01-18 DIAGNOSIS — E1122 Type 2 diabetes mellitus with diabetic chronic kidney disease: Secondary | ICD-10-CM | POA: Diagnosis not present

## 2021-01-18 LAB — CBC
HCT: 32 % — ABNORMAL LOW (ref 36.0–46.0)
Hemoglobin: 10.8 g/dL — ABNORMAL LOW (ref 12.0–15.0)
MCH: 26.3 pg (ref 26.0–34.0)
MCHC: 33.8 g/dL (ref 30.0–36.0)
MCV: 78 fL — ABNORMAL LOW (ref 80.0–100.0)
Platelets: 148 10*3/uL — ABNORMAL LOW (ref 150–400)
RBC: 4.1 MIL/uL (ref 3.87–5.11)
RDW: 14.9 % (ref 11.5–15.5)
WBC: 6.8 10*3/uL (ref 4.0–10.5)
nRBC: 0 % (ref 0.0–0.2)

## 2021-01-18 LAB — IRON AND TIBC
Iron: 62 ug/dL (ref 28–170)
Saturation Ratios: 21 % (ref 10.4–31.8)
TIBC: 298 ug/dL (ref 250–450)
UIBC: 236 ug/dL

## 2021-01-18 LAB — FERRITIN: Ferritin: 124 ng/mL (ref 11–307)

## 2021-01-18 MED ORDER — EPOETIN ALFA-EPBX 40000 UNIT/ML IJ SOLN
40000.0000 [IU] | INTRAMUSCULAR | Status: AC
  Administered 2021-01-18: 40000 [IU] via SUBCUTANEOUS
  Filled 2021-01-18: qty 1

## 2021-01-18 NOTE — Progress Notes (Signed)
Hematology/Oncology Consult note [facility]  Telephone:([phone] [phone]  Patient Care Team: Dion Body, MD as PCP - General (Family Medicine) Minna Merritts, MD as Consulting Physician (Cardiology) Anthonette Legato, MD as Consulting Physician (Nephrology) Jolley Lance, MD as Consulting Physician (Cardiology) Lorelee Cover., MD (Ophthalmology) Edrick Kins, DPM as Consulting Physician (Podiatry)   Name of the patient: [name] [name]  [id number]  [birth year]   Date of visit: 01/18/21  Diagnosis- chronic anemia multifactorial secondary to anemia of chronic kidney disease and a component of iron deficiency   Chief complaint/ Reason for visit-routine follow-up of anemia of chronic kidney disease for Retacrit  Heme/Onc history: patient is a86 year old female with a past medical history significant for hypertension and stage III chronic kidney disease among other medical problems. She also has hypertension hyperlipidemia and type 2 diabetes. She was recently found by Dr. Clayton Bibles and was noted to have a hemoglobin of 9.4. Iron studies also revealed a ferritin of 22, iron saturation 13%. TIBC was normal at 302 on serum iron was low normal at 39 white count and platelets within normal limits. She has been referred to [place] for iron deficiency anemia. She has been seen by Dr. Remi Haggard GI the past and had a colonoscopy in 2017 which did not reveal any evidence of malignancy. 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 Grayland Ormond in September 2017  Blood work from 05/24/2017 was as follows: CBC showed white count of 7.8, H&H of 10.2/30.2 with an MCV of 68.1 and a platelet count of 199. Ferritin levels were low at 14. Vitamin B12 is worse normal at 947. Folate was normal. Haptoglobin and TSH was normal. Iron studies were within  normal limits. ESR was mildly elevated at 56. Multiple myeloma panel did not reveal any monoclonal protein. H. pylori stool antigen was negative.   Interval history-patient reports chronic fatigue but is overall doing well.  She remains independent of her ADLs and IADLs.  ECOG PS- 1 Pain scale- 0   Review of systems- Review of Systems  Constitutional: Positive for malaise/fatigue. Negative for chills, fever and weight loss.  HENT: Negative for congestion, ear discharge and nosebleeds.   Eyes: Negative for blurred vision.  Respiratory: Negative for cough, hemoptysis, sputum production, shortness of breath and wheezing.   Cardiovascular: Negative for chest pain, palpitations, orthopnea and claudication.  Gastrointestinal: Negative for abdominal pain, blood in stool, constipation, diarrhea, heartburn, melena, nausea and vomiting.  Genitourinary: Negative for dysuria, flank pain, frequency, hematuria and urgency.  Musculoskeletal: Negative for back pain, joint pain and myalgias.  Skin: Negative for rash.  Neurological: Negative for dizziness, tingling, focal weakness, seizures, weakness and headaches.  Endo/Heme/Allergies: Does not bruise/bleed easily.  Psychiatric/Behavioral: Negative for depression and suicidal ideas. The patient does not have insomnia.       Allergies  Allergen Reactions  . Metoprolol Other (See Comments)    Bad dreams Patient stated that she had suicidal thoughts while taking this medication.   . Diltiazem Other (See Comments)    Patient stated that she had bad dreams with this medication  . Levofloxacin Nausea And Vomiting and Nausea Only          Past Medical History:  Diagnosis Date  . 1st degree AV block    a. PR 400 msec with Apacing  . A-fib (HCC)    Hx of  .  Anemia   . Anxiety   . Arthritis    knees and lower back   . Chest pain    a. 2004 Cath:  reportedly nl;  b. 09/2013 Neg MV.  . Chronic diastolic heart failure ([place])    a. 09/2013 Echo:  EF 50-55%, mild LVH, mild to mod MR/TR.  Marland Kitchen Chronic kidney disease    Stage IV  . Congestive heart disease ([place])   . Esophageal reflux   . Gout   . H/O hiatal hernia   . History of UTI   . Hyperlipidemia   . Morbid obesity ([place])   . OSA on CPAP    CPAP  . Pacemaker    copy of medtronic card in chart  . PAF (paroxysmal atrial fibrillation) ([place])   . Seasonal allergies   . SSS (sick sinus syndrome) ([place])    a. 2008 s/p MDT PPM;  b. 11/2013 Gen change- MDT ADDRL1 Adapta DC PPM, ser # [id number] H.  . Symptomatic PVCs   . Syncope and collapse    a. Felt to be vasovagal.  . Thyroid disease    nodules on thyroid, with goiter, on no meds per pt  . Type II diabetes mellitus (HCC)    Type 2  . Unspecified essential hypertension    controlled on meds  . Unspecified glaucoma(365.9)   . Vertigo    in past  . Wears dentures    upper full and lower partial     Past Surgical History:  Procedure Laterality Date  . APPENDECTOMY    . CARDIAC CATHETERIZATION    . CARDIOVERSION N/A 04/26/2017   Procedure: Cardioversion;  Surgeon: Minna Merritts, MD;  Location: [facility] ORS;  Service: Cardiovascular;  Laterality: N/A;  . CATARACT EXTRACTION W/PHACO Left 07/21/2020   Procedure: CATARACT EXTRACTION PHACO AND INTRAOCULAR LENS PLACEMENT ([place]) LEFT DIABETIC;  Surgeon: Leandrew Koyanagi, MD;  Location: [place];  Service: Ophthalmology;  Laterality: Left;  10.25 1:13.2 14.0%  . CATARACT EXTRACTION W/PHACO Right 08/11/2020   Procedure: CATARACT EXTRACTION PHACO AND INTRAOCULAR LENS PLACEMENT ([place]) RIGHT DIABETIC;  Surgeon: Leandrew Koyanagi, MD;  Location: [place];  Service: Ophthalmology;  Laterality: Right;  11.63 1:25.0 13.7%  . DILATION AND CURETTAGE OF UTERUS    . INCISION AND DRAINAGE OF WOUND Right 03/1998   "leg; it was like a boil" (12/01/2013)  . INSERT / REPLACE / REMOVE PACEMAKER  2008; 12/01/2013   MDT ADDRL1 pacemaker - gen change by Dr Caryl Comes 12/01/2013  .  [facility]G [facility]OPSY  [facility]   [facility]  . PERMANENT PACEMAKER GENERATOR CHANGE N/A 12/01/2013   Procedure: PERMANENT PACEMAKER GENERATOR CHANGE;  Surgeon: Deboraha Sprang, MD;  Location: [facility] CATH LAB;  Service: Cardiovascular;  Laterality: N/A;  . VAGINAL HYSTERECTOMY  1970    Social History   Socioeconomic History  . Marital status: Widowed    Spouse name: Not on file  . Number of children: Y  . Years of education: Not on file  . Highest education level: Not on file  Occupational History  . Occupation: retired  Tobacco Use  . Smoking status: Former Smoker    Packs/day: 0.50    Years: 34.00    Pack years: 17.00    Types: Cigarettes    Quit date: 08/15/1989    Years since quitting: 31.4  . Smokeless tobacco: Never Used  Vaping Use  . Vaping Use: Never used  Substance and Sexual Activity  . Alcohol use: No    Alcohol/week: 0.0 standard  drinks  . Drug use: No  . Sexual activity: Never  Other Topics Concern  . Not on file  Social History Narrative   Retired. Widowed. Regularly exercises.    Social Determinants of Health   Financial Resource Strain: Not on file  Food Insecurity: Not on file  Transportation Needs: Not on file  Physical Activity: Not on file  Stress: Not on file  Social Connections: Not on file  Intimate Partner Violence: Not on file    Family History  Problem Relation Age of Onset  . Heart disease Father   . Rheum arthritis Father   . Allergies Father   . Liver cancer Mother   . Pancreatic cancer Mother   . Hypertension Sister   . Rheum arthritis Sister   . Breast cancer Maternal Aunt      Current Outpatient Medications:  .  acetaminophen (TYLENOL) 500 MG tablet, Take 1,000 mg by mouth every 6 (six) hours as needed for moderate pain. , Disp: , Rfl:  .  allopurinol (ZYLOPRIM) 100 MG tablet, Take 100 mg by mouth daily. , Disp: , Rfl:  .  amiodarone (PACERONE) 200 MG tablet, Take 100 mg by mouth 2 (two) times daily., Disp: , Rfl:  .  amLODipine (NORVASC) 10 MG  tablet, TAKE 1 TABLET (10 MG TOTAL) BY MOUTH DAILY. (Patient taking differently: Take 5 mg by mouth daily.), Disp: 90 tablet, Rfl: 0 .  atorvastatin (LIPITOR) 20 MG tablet, TAKE 1 TABLET (20 MG TOTAL) BY MOUTH DAILY. (Patient taking differently: Take 20 mg by mouth at bedtime.), Disp: 90 tablet, Rfl: 0 .  carvedilol (COREG) 25 MG tablet, Take 25-50 mg by mouth See admin instructions. Take 1 tablet (25MG) by mouth every morning and 2 tablets (50MG) by mouth every night before bed, Disp: , Rfl:  .  diclofenac sodium (VOLTAREN) 1 % GEL, Apply 2 g topically 4 (four) times daily., Disp: , Rfl:  .  ferrous sulfate 325 (65 FE) MG EC tablet, Take 325 mg by mouth daily. , Disp: , Rfl:  .  furosemide (LASIX) 20 MG tablet, Take 1 tablet (20 mg total) by mouth daily., Disp: 90 tablet, Rfl: 0 .  glipiZIDE (GLUCOTROL) 10 MG tablet, TAKE 1 TABLET (10 MG TOTAL) BY MOUTH 2 (TWO) TIMES DAILY BEFORE A MEAL., Disp: 180 tablet, Rfl: 0 .  Glycerin, Laxative, (FLEET LIQUID GLYCERIN SUPP RE), Place 1 Dose rectally daily as needed (constipation)., Disp: , Rfl:  .  KLOR-CON M10 10 MEQ tablet, Take 1 tablet by mouth daily., Disp: , Rfl: 3 .  Multiple Vitamins-Minerals (CENTRUM SILVER PO), Take 1 tablet by mouth daily. , Disp: , Rfl:  .  olmesartan (BENICAR) 40 MG tablet, TAKE 1 TABLET (40 MG TOTAL) BY MOUTH DAILY., Disp: 90 tablet, Rfl: 0 .  pantoprazole (PROTONIX) 40 MG tablet, Take 40 mg by mouth daily., Disp: , Rfl:  .  polyethylene glycol (MIRALAX / GLYCOLAX) packet, Take 17 g by mouth daily as needed for moderate constipation., Disp: , Rfl:  .  TRADJENTA 5 MG TABS tablet, TAKE 1 TABLET (5 MG TOTAL) BY MOUTH DAILY., Disp: 30 tablet, Rfl: 2 .  warfarin (COUMADIN) 3 MG tablet, TAKE 1-1.5 TABLETS DAILY AT 6 PM. TAKE 1 TAB ON TUES, THURS, SAT, SUN. TAKE 4.5 MG ON MON, WED, FRI (Patient taking differently: 3 mg Monday-Saturday, 63m on Sunday), Disp: 100 tablet, Rfl: 3 .  Brinzolamide-Brimonidine (SIMBRINZA) 1-0.2 % SUSP, Place 1  drop into both eyes 2 (two) times daily. (Patient not  taking: No sig reported), Disp: 1 Bottle, Rfl: 1 .  mometasone (ELOCON) 0.1 % cream, Apply topically daily. Apply to affected area as directed two times a day (Patient not taking: Reported on 01/18/2021), Disp: 45 g, Rfl: 0 .  travoprost, benzalkonium, (TRAVATAN) 0.004 % ophthalmic solution, Place 1 drop into both eyes at bedtime.  (Patient not taking: No sig reported), Disp: , Rfl:  No current facility-administered medications for this visit.  Facility-Administered Medications Ordered in Other Visits:  .  epoetin alfa-epbx (RETACRIT) injection 40,000 Units, 40,000 Units, Subcutaneous, Weekly, Sindy Guadeloupe, MD, 40,000 Units at 01/18/21 1101  Physical exam:  Vitals:   01/18/21 1010  BP: 139/72  Pulse: 66  Resp: 18  Temp: (!) 97.4 F (36.3 C)  TempSrc: Tympanic  SpO2: 100%  Weight: 254 lb 12.8 oz (115.6 kg)   Physical Exam Eyes:     Extraocular Movements: EOM normal.  Cardiovascular:     Rate and Rhythm: Normal rate and regular rhythm.     Heart sounds: Normal heart sounds.  Pulmonary:     Effort: Pulmonary effort is normal.     Breath sounds: Normal breath sounds.  Abdominal:     General: Bowel sounds are normal.     Palpations: Abdomen is soft.  Skin:    General: Skin is warm and dry.  Neurological:     Mental Status: She is alert and oriented to person, place, and time.      CMP Latest Ref Rng & Units 02/06/2020  Glucose 70 - 99 mg/dL 182(H)  BUN 8 - 23 mg/dL 33(H)  Creatinine 0.44 - 1.00 mg/dL 2.89(H)  Sodium 135 - 145 mmol/L 140  Potassium 3.5 - 5.1 mmol/L 4.2  Chloride 98 - 111 mmol/L 106  CO2 22 - 32 mmol/L 24  Calcium 8.9 - 10.3 mg/dL 9.3  Total Protein 6.5 - 8.1 g/dL 6.4(L)  Total Bilirubin 0.3 - 1.2 mg/dL 0.7  Alkaline Phos 38 - 126 U/L 76  AST 15 - 41 U/L 22  ALT 0 - 44 U/L 18   CBC Latest Ref Rng & Units 01/18/2021  WBC 4.0 - 10.5 K/uL 6.8  Hemoglobin 12.0 - 15.0 g/dL 10.8(L)  Hematocrit 36.0 - 46.0  % 32.0(L)  Platelets 150 - 400 K/uL 148(L)      Assessment and plan- Patient is a 86 y.o. female with anemia of chronic kidney disease here for a routine follow-up  Patient's hemoglobin today is 10.8 and she will continue to get Retacrit every 3 weeks when hemoglobin is less than 11.  Iron studies are presently within normal limits and she does not require any IV iron at this time.  I will see her back in 3 months with repeat CBC ferritin and iron studies for possible Retacrit   Visit Diagnosis 1. Anemia of chronic renal failure, stage 3a ([place])   2. Iron deficiency anemia, unspecified iron deficiency anemia type      Dr. Randa Evens, MD, MPH [facility] [phone] 01/18/2021 12:35 PM

## 2021-02-01 DIAGNOSIS — I480 Paroxysmal atrial fibrillation: Secondary | ICD-10-CM | POA: Diagnosis not present

## 2021-02-03 DIAGNOSIS — H43823 Vitreomacular adhesion, bilateral: Secondary | ICD-10-CM | POA: Diagnosis not present

## 2021-02-04 DIAGNOSIS — J019 Acute sinusitis, unspecified: Secondary | ICD-10-CM | POA: Diagnosis not present

## 2021-02-04 DIAGNOSIS — H524 Presbyopia: Secondary | ICD-10-CM | POA: Diagnosis not present

## 2021-02-07 DIAGNOSIS — E1122 Type 2 diabetes mellitus with diabetic chronic kidney disease: Secondary | ICD-10-CM | POA: Diagnosis not present

## 2021-02-07 DIAGNOSIS — I10 Essential (primary) hypertension: Secondary | ICD-10-CM | POA: Diagnosis not present

## 2021-02-07 DIAGNOSIS — I480 Paroxysmal atrial fibrillation: Secondary | ICD-10-CM | POA: Diagnosis not present

## 2021-02-07 DIAGNOSIS — G4733 Obstructive sleep apnea (adult) (pediatric): Secondary | ICD-10-CM | POA: Diagnosis not present

## 2021-02-07 DIAGNOSIS — E7800 Pure hypercholesterolemia, unspecified: Secondary | ICD-10-CM | POA: Diagnosis not present

## 2021-02-07 DIAGNOSIS — Z9989 Dependence on other enabling machines and devices: Secondary | ICD-10-CM | POA: Diagnosis not present

## 2021-02-07 DIAGNOSIS — Z950 Presence of cardiac pacemaker: Secondary | ICD-10-CM | POA: Diagnosis not present

## 2021-02-07 DIAGNOSIS — I5032 Chronic diastolic (congestive) heart failure: Secondary | ICD-10-CM | POA: Diagnosis not present

## 2021-02-07 DIAGNOSIS — I495 Sick sinus syndrome: Secondary | ICD-10-CM | POA: Diagnosis not present

## 2021-02-08 ENCOUNTER — Inpatient Hospital Stay: Payer: HMO | Attending: Oncology

## 2021-02-08 ENCOUNTER — Inpatient Hospital Stay: Payer: HMO

## 2021-02-08 DIAGNOSIS — D649 Anemia, unspecified: Secondary | ICD-10-CM

## 2021-02-08 DIAGNOSIS — N1831 Chronic kidney disease, stage 3a: Secondary | ICD-10-CM | POA: Diagnosis not present

## 2021-02-08 DIAGNOSIS — Z79899 Other long term (current) drug therapy: Secondary | ICD-10-CM

## 2021-02-08 DIAGNOSIS — D631 Anemia in chronic kidney disease: Secondary | ICD-10-CM | POA: Diagnosis not present

## 2021-02-08 LAB — HEMOGLOBIN AND HEMATOCRIT, BLOOD
HCT: 34.8 % — ABNORMAL LOW (ref 36.0–46.0)
Hemoglobin: 11.6 g/dL — ABNORMAL LOW (ref 12.0–15.0)

## 2021-03-01 ENCOUNTER — Inpatient Hospital Stay: Payer: HMO

## 2021-03-01 DIAGNOSIS — I480 Paroxysmal atrial fibrillation: Secondary | ICD-10-CM | POA: Diagnosis not present

## 2021-03-01 DIAGNOSIS — N1831 Chronic kidney disease, stage 3a: Secondary | ICD-10-CM | POA: Diagnosis not present

## 2021-03-01 DIAGNOSIS — D631 Anemia in chronic kidney disease: Secondary | ICD-10-CM

## 2021-03-01 DIAGNOSIS — D649 Anemia, unspecified: Secondary | ICD-10-CM

## 2021-03-01 LAB — HEMOGLOBIN AND HEMATOCRIT, BLOOD
HCT: 32.4 % — ABNORMAL LOW (ref 36.0–46.0)
Hemoglobin: 11 g/dL — ABNORMAL LOW (ref 12.0–15.0)

## 2021-03-11 DIAGNOSIS — R791 Abnormal coagulation profile: Secondary | ICD-10-CM | POA: Diagnosis not present

## 2021-03-21 DIAGNOSIS — E1122 Type 2 diabetes mellitus with diabetic chronic kidney disease: Secondary | ICD-10-CM | POA: Diagnosis not present

## 2021-03-21 DIAGNOSIS — I10 Essential (primary) hypertension: Secondary | ICD-10-CM | POA: Diagnosis not present

## 2021-03-21 DIAGNOSIS — N184 Chronic kidney disease, stage 4 (severe): Secondary | ICD-10-CM | POA: Diagnosis not present

## 2021-03-21 DIAGNOSIS — N2581 Secondary hyperparathyroidism of renal origin: Secondary | ICD-10-CM | POA: Diagnosis not present

## 2021-03-21 DIAGNOSIS — D631 Anemia in chronic kidney disease: Secondary | ICD-10-CM | POA: Diagnosis not present

## 2021-03-22 ENCOUNTER — Inpatient Hospital Stay: Payer: HMO | Attending: Oncology

## 2021-03-22 ENCOUNTER — Inpatient Hospital Stay: Payer: HMO

## 2021-03-22 VITALS — BP 131/78 | HR 76

## 2021-03-22 DIAGNOSIS — N1831 Chronic kidney disease, stage 3a: Secondary | ICD-10-CM | POA: Diagnosis not present

## 2021-03-22 DIAGNOSIS — D649 Anemia, unspecified: Secondary | ICD-10-CM

## 2021-03-22 DIAGNOSIS — D631 Anemia in chronic kidney disease: Secondary | ICD-10-CM | POA: Insufficient documentation

## 2021-03-22 DIAGNOSIS — D509 Iron deficiency anemia, unspecified: Secondary | ICD-10-CM

## 2021-03-22 LAB — HEMOGLOBIN AND HEMATOCRIT, BLOOD
HCT: 31.9 % — ABNORMAL LOW (ref 36.0–46.0)
Hemoglobin: 10.9 g/dL — ABNORMAL LOW (ref 12.0–15.0)

## 2021-03-22 MED ORDER — EPOETIN ALFA-EPBX 40000 UNIT/ML IJ SOLN
40000.0000 [IU] | INTRAMUSCULAR | Status: DC
  Administered 2021-03-22: 40000 [IU] via SUBCUTANEOUS
  Filled 2021-03-22: qty 1

## 2021-03-25 DIAGNOSIS — I480 Paroxysmal atrial fibrillation: Secondary | ICD-10-CM | POA: Diagnosis not present

## 2021-03-28 DIAGNOSIS — R791 Abnormal coagulation profile: Secondary | ICD-10-CM | POA: Diagnosis not present

## 2021-03-30 DIAGNOSIS — I480 Paroxysmal atrial fibrillation: Secondary | ICD-10-CM | POA: Diagnosis not present

## 2021-03-30 DIAGNOSIS — E042 Nontoxic multinodular goiter: Secondary | ICD-10-CM | POA: Diagnosis not present

## 2021-03-30 DIAGNOSIS — I10 Essential (primary) hypertension: Secondary | ICD-10-CM | POA: Diagnosis not present

## 2021-03-30 DIAGNOSIS — G4733 Obstructive sleep apnea (adult) (pediatric): Secondary | ICD-10-CM | POA: Diagnosis not present

## 2021-03-30 DIAGNOSIS — N184 Chronic kidney disease, stage 4 (severe): Secondary | ICD-10-CM | POA: Diagnosis not present

## 2021-03-30 DIAGNOSIS — Z950 Presence of cardiac pacemaker: Secondary | ICD-10-CM | POA: Diagnosis not present

## 2021-03-30 DIAGNOSIS — I495 Sick sinus syndrome: Secondary | ICD-10-CM | POA: Diagnosis not present

## 2021-03-30 DIAGNOSIS — E1122 Type 2 diabetes mellitus with diabetic chronic kidney disease: Secondary | ICD-10-CM | POA: Diagnosis not present

## 2021-03-30 DIAGNOSIS — I5032 Chronic diastolic (congestive) heart failure: Secondary | ICD-10-CM | POA: Diagnosis not present

## 2021-03-30 DIAGNOSIS — Z9989 Dependence on other enabling machines and devices: Secondary | ICD-10-CM | POA: Diagnosis not present

## 2021-03-30 DIAGNOSIS — R0602 Shortness of breath: Secondary | ICD-10-CM | POA: Diagnosis not present

## 2021-04-11 DIAGNOSIS — L851 Acquired keratosis [keratoderma] palmaris et plantaris: Secondary | ICD-10-CM | POA: Diagnosis not present

## 2021-04-11 DIAGNOSIS — I480 Paroxysmal atrial fibrillation: Secondary | ICD-10-CM | POA: Diagnosis not present

## 2021-04-11 DIAGNOSIS — B351 Tinea unguium: Secondary | ICD-10-CM | POA: Diagnosis not present

## 2021-04-11 DIAGNOSIS — R0602 Shortness of breath: Secondary | ICD-10-CM | POA: Diagnosis not present

## 2021-04-11 DIAGNOSIS — E1140 Type 2 diabetes mellitus with diabetic neuropathy, unspecified: Secondary | ICD-10-CM | POA: Diagnosis not present

## 2021-04-12 ENCOUNTER — Inpatient Hospital Stay (HOSPITAL_BASED_OUTPATIENT_CLINIC_OR_DEPARTMENT_OTHER): Payer: HMO | Admitting: Oncology

## 2021-04-12 ENCOUNTER — Encounter: Payer: Self-pay | Admitting: Oncology

## 2021-04-12 ENCOUNTER — Inpatient Hospital Stay: Payer: HMO

## 2021-04-12 ENCOUNTER — Other Ambulatory Visit: Payer: Self-pay | Admitting: *Deleted

## 2021-04-12 ENCOUNTER — Inpatient Hospital Stay: Payer: HMO | Attending: Oncology

## 2021-04-12 VITALS — BP 142/61 | HR 60 | Temp 97.5°F | Resp 18 | Wt 249.0 lb

## 2021-04-12 DIAGNOSIS — N184 Chronic kidney disease, stage 4 (severe): Secondary | ICD-10-CM | POA: Insufficient documentation

## 2021-04-12 DIAGNOSIS — D649 Anemia, unspecified: Secondary | ICD-10-CM

## 2021-04-12 DIAGNOSIS — N183 Chronic kidney disease, stage 3 unspecified: Secondary | ICD-10-CM

## 2021-04-12 DIAGNOSIS — D631 Anemia in chronic kidney disease: Secondary | ICD-10-CM | POA: Insufficient documentation

## 2021-04-12 DIAGNOSIS — Z79899 Other long term (current) drug therapy: Secondary | ICD-10-CM

## 2021-04-12 DIAGNOSIS — D509 Iron deficiency anemia, unspecified: Secondary | ICD-10-CM

## 2021-04-12 LAB — VITAMIN B12: Vitamin B-12: 922 pg/mL — ABNORMAL HIGH (ref 180–914)

## 2021-04-12 LAB — CBC WITH DIFFERENTIAL/PLATELET
Abs Immature Granulocytes: 0.02 10*3/uL (ref 0.00–0.07)
Basophils Absolute: 0 10*3/uL (ref 0.0–0.1)
Basophils Relative: 0 %
Eosinophils Absolute: 0.2 10*3/uL (ref 0.0–0.5)
Eosinophils Relative: 4 %
HCT: 33.2 % — ABNORMAL LOW (ref 36.0–46.0)
Hemoglobin: 11.1 g/dL — ABNORMAL LOW (ref 12.0–15.0)
Immature Granulocytes: 0 %
Lymphocytes Relative: 25 %
Lymphs Abs: 1.4 10*3/uL (ref 0.7–4.0)
MCH: 26.8 pg (ref 26.0–34.0)
MCHC: 33.4 g/dL (ref 30.0–36.0)
MCV: 80.2 fL (ref 80.0–100.0)
Monocytes Absolute: 0.5 10*3/uL (ref 0.1–1.0)
Monocytes Relative: 9 %
Neutro Abs: 3.4 10*3/uL (ref 1.7–7.7)
Neutrophils Relative %: 62 %
Platelets: 180 10*3/uL (ref 150–400)
RBC: 4.14 MIL/uL (ref 3.87–5.11)
RDW: 15.9 % — ABNORMAL HIGH (ref 11.5–15.5)
WBC: 5.5 10*3/uL (ref 4.0–10.5)
nRBC: 0 % (ref 0.0–0.2)

## 2021-04-12 LAB — IRON AND TIBC
Iron: 79 ug/dL (ref 28–170)
Saturation Ratios: 29 % (ref 10.4–31.8)
TIBC: 272 ug/dL (ref 250–450)
UIBC: 193 ug/dL

## 2021-04-12 LAB — FERRITIN: Ferritin: 126 ng/mL (ref 11–307)

## 2021-04-12 LAB — FOLATE: Folate: 26 ng/mL (ref >5.9)

## 2021-04-12 NOTE — Progress Notes (Signed)
Pt in for follow up, states is doing well. Encouraged that she has lost some weight.

## 2021-04-12 NOTE — Progress Notes (Signed)
Hematology/Oncology Consult note [facility]  Telephone:([phone] [phone]  Patient Care Team: Dion Body, MD as PCP - General (Family Medicine) Minna Merritts, MD as Consulting Physician (Cardiology) Anthonette Legato, MD as Consulting Physician (Nephrology) Skelly Lance, MD as Consulting Physician (Cardiology) Lorelee Cover., MD (Ophthalmology) Edrick Kins, DPM as Consulting Physician (Podiatry) Sindy Guadeloupe, MD as Consulting Physician (Hematology and Oncology)   Name of the patient: [name] [name]  [id number]  [birth year]   Date of visit: 04/12/21  Diagnosis- chronic anemia multifactorial secondary to anemia of chronic kidney disease and a component of iron deficiency   Chief complaint/ Reason for visit-routine follow-up of anemia of chronic kidney disease for Retacrit  Heme/Onc history: Patient is a 86 year old female with history of anemia of chronic kidney disease for which she is on Retacrit.  She also has hemoglobin C trait.  She has received IV iron in the past as well.  Hemoglobin stable between 10-11 on Retacrit  Interval history-patient reports doing well and denies any specific complaints at this time.  She has mild baseline fatigue  ECOG PS- 1 Pain scale- 0  Review of systems- Review of Systems  Constitutional: Negative for chills, fever, malaise/fatigue and weight loss.  HENT: Negative for congestion, ear discharge and nosebleeds.   Eyes: Negative for blurred vision.  Respiratory: Negative for cough, hemoptysis, sputum production, shortness of breath and wheezing.   Cardiovascular: Negative for chest pain, palpitations, orthopnea and claudication.  Gastrointestinal: Negative for abdominal pain, blood in stool, constipation, diarrhea, heartburn, melena, nausea and vomiting.  Genitourinary: Negative for dysuria, flank pain, frequency, hematuria and urgency.  Musculoskeletal: Negative for back pain, joint pain  and myalgias.  Skin: Negative for rash.  Neurological: Negative for dizziness, tingling, focal weakness, seizures, weakness and headaches.  Endo/Heme/Allergies: Does not bruise/bleed easily.  Psychiatric/Behavioral: Negative for depression and suicidal ideas. The patient does not have insomnia.      Allergies  Allergen Reactions  . Metoprolol Other (See Comments)    Bad dreams Patient stated that she had suicidal thoughts while taking this medication.   . Diltiazem Other (See Comments)    Patient stated that she had bad dreams with this medication  . Levofloxacin Nausea And Vomiting and Nausea Only          Past Medical History:  Diagnosis Date  . 1st degree AV block    a. PR 400 msec with Apacing  . A-fib (HCC)    Hx of  . Anemia   . Anxiety   . Arthritis    knees and lower back   . Chest pain    a. 2004 Cath:  reportedly nl;  b. 09/2013 Neg MV.  . Chronic diastolic heart failure ([place])    a. 09/2013 Echo: EF 50-55%, mild LVH, mild to mod MR/TR.  Marland Kitchen Chronic kidney disease    Stage IV  . Congestive heart disease ([place])   . Esophageal reflux   . Gout   . H/O hiatal hernia   . History of UTI   . Hyperlipidemia   . Morbid obesity ([place])   . OSA on CPAP    CPAP  . Pacemaker    copy of medtronic card in chart  . PAF (paroxysmal atrial fibrillation) ([place])   . Seasonal allergies   . SSS (sick sinus syndrome) ([place])    a. 2008 s/p MDT PPM;  b. 11/2013 Gen change- MDT ADDRL1 Adapta DC PPM, ser # [id number] H.  Marland Kitchen  Symptomatic PVCs   . Syncope and collapse    a. Felt to be vasovagal.  . Thyroid disease    nodules on thyroid, with goiter, on no meds per pt  . Type II diabetes mellitus (HCC)    Type 2  . Unspecified essential hypertension    controlled on meds  . Unspecified glaucoma(365.9)   . Vertigo    in past  . Wears dentures    upper full and lower partial     Past Surgical History:  Procedure Laterality Date  . APPENDECTOMY    . CARDIAC CATHETERIZATION    .  CARDIOVERSION N/A 04/26/2017   Procedure: Cardioversion;  Surgeon: Minna Merritts, MD;  Location: [facility] ORS;  Service: Cardiovascular;  Laterality: N/A;  . CATARACT EXTRACTION W/PHACO Left 07/21/2020   Procedure: CATARACT EXTRACTION PHACO AND INTRAOCULAR LENS PLACEMENT ([place]) LEFT DIABETIC;  Surgeon: Leandrew Koyanagi, MD;  Location: [place];  Service: Ophthalmology;  Laterality: Left;  10.25 1:13.2 14.0%  . CATARACT EXTRACTION W/PHACO Right 08/11/2020   Procedure: CATARACT EXTRACTION PHACO AND INTRAOCULAR LENS PLACEMENT ([place]) RIGHT DIABETIC;  Surgeon: Leandrew Koyanagi, MD;  Location: [place];  Service: Ophthalmology;  Laterality: Right;  11.63 1:25.0 13.7%  . DILATION AND CURETTAGE OF UTERUS    . INCISION AND DRAINAGE OF WOUND Right 03/1998   "leg; it was like a boil" (12/01/2013)  . INSERT / REPLACE / REMOVE PACEMAKER  2008; 12/01/2013   MDT ADDRL1 pacemaker - gen change by Dr Caryl Comes 12/01/2013  . LUNG BIOPSY  2010   [facility]  . PERMANENT PACEMAKER GENERATOR CHANGE N/A 12/01/2013   Procedure: PERMANENT PACEMAKER GENERATOR CHANGE;  Surgeon: Deboraha Sprang, MD;  Location: [facility] CATH LAB;  Service: Cardiovascular;  Laterality: N/A;  . VAGINAL HYSTERECTOMY  1970    Social History   Socioeconomic History  . Marital status: Widowed    Spouse name: Not on file  . Number of children: Y  . Years of education: Not on file  . Highest education level: Not on file  Occupational History  . Occupation: retired  Tobacco Use  . Smoking status: Former Smoker    Packs/day: 0.50    Years: 34.00    Pack years: 17.00    Types: Cigarettes    Quit date: 08/15/1989    Years since quitting: 31.6  . Smokeless tobacco: Never Used  Vaping Use  . Vaping Use: Never used  Substance and Sexual Activity  . Alcohol use: No    Alcohol/week: 0.0 standard drinks  . Drug use: No  . Sexual activity: Never  Other Topics Concern  . Not on file  Social History Narrative   Retired. Widowed.  Regularly exercises.    Social Determinants of Health   Financial Resource Strain: Not on file  Food Insecurity: Not on file  Transportation Needs: Not on file  Physical Activity: Not on file  Stress: Not on file  Social Connections: Not on file  Intimate Partner Violence: Not on file    Family History  Problem Relation Age of Onset  . Heart disease Father   . Rheum arthritis Father   . Allergies Father   . Liver cancer Mother   . Pancreatic cancer Mother   . Hypertension Sister   . Rheum arthritis Sister   . Breast cancer Maternal Aunt      Current Outpatient Medications:  .  acetaminophen (TYLENOL) 500 MG tablet, Take 1,000 mg by mouth every 6 (six) hours as needed for moderate pain. ,  Disp: , Rfl:  .  allopurinol (ZYLOPRIM) 100 MG tablet, Take 100 mg by mouth daily. , Disp: , Rfl:  .  amiodarone (PACERONE) 200 MG tablet, Take 100 mg by mouth 2 (two) times daily., Disp: , Rfl:  .  amLODipine (NORVASC) 10 MG tablet, TAKE 1 TABLET (10 MG TOTAL) BY MOUTH DAILY. (Patient taking differently: Take 5 mg by mouth daily.), Disp: 90 tablet, Rfl: 0 .  atorvastatin (LIPITOR) 20 MG tablet, TAKE 1 TABLET (20 MG TOTAL) BY MOUTH DAILY. (Patient taking differently: Take 20 mg by mouth at bedtime.), Disp: 90 tablet, Rfl: 0 .  carvedilol (COREG) 25 MG tablet, Take 25-50 mg by mouth See admin instructions. Take 1 tablet (25MG ) by mouth every morning and 2 tablets (50MG ) by mouth every night before bed, Disp: , Rfl:  .  diclofenac sodium (VOLTAREN) 1 % GEL, Apply 2 g topically 4 (four) times daily., Disp: , Rfl:  .  ferrous sulfate 325 (65 FE) MG EC tablet, Take 325 mg by mouth daily. , Disp: , Rfl:  .  furosemide (LASIX) 20 MG tablet, Take 1 tablet (20 mg total) by mouth daily., Disp: 90 tablet, Rfl: 0 .  glipiZIDE (GLUCOTROL) 10 MG tablet, TAKE 1 TABLET (10 MG TOTAL) BY MOUTH 2 (TWO) TIMES DAILY BEFORE A MEAL., Disp: 180 tablet, Rfl: 0 .  Glycerin, Laxative, (FLEET LIQUID GLYCERIN SUPP RE), Place  1 Dose rectally daily as needed (constipation)., Disp: , Rfl:  .  KLOR-CON M10 10 MEQ tablet, Take 1 tablet by mouth daily., Disp: , Rfl: 3 .  mometasone (ELOCON) 0.1 % cream, Apply topically daily. Apply to affected area as directed two times a day, Disp: 45 g, Rfl: 0 .  Multiple Vitamins-Minerals (CENTRUM SILVER PO), Take 1 tablet by mouth daily. , Disp: , Rfl:  .  olmesartan (BENICAR) 40 MG tablet, TAKE 1 TABLET (40 MG TOTAL) BY MOUTH DAILY., Disp: 90 tablet, Rfl: 0 .  pantoprazole (PROTONIX) 40 MG tablet, Take 40 mg by mouth daily., Disp: , Rfl:  .  polyethylene glycol (MIRALAX / GLYCOLAX) packet, Take 17 g by mouth daily as needed for moderate constipation., Disp: , Rfl:  .  warfarin (COUMADIN) 3 MG tablet, TAKE 1-1.5 TABLETS DAILY AT 6 PM. TAKE 1 TAB ON TUES, THURS, SAT, SUN. TAKE 4.5 MG ON MON, WED, FRI (Patient taking differently: 3mg  tues and Thursday, 2mg  mon,wed,Friday, Saturday and sunday), Disp: 100 tablet, Rfl: 3  Physical exam:  Vitals:   04/12/21 0934  BP: (!) 142/61  Pulse: 60  Resp: 18  Temp: (!) 97.5 F (36.4 C)  TempSrc: Tympanic  Weight: 249 lb (112.9 kg)   Physical Exam Constitutional:      General: She is not in acute distress. Cardiovascular:     Rate and Rhythm: Normal rate and regular rhythm.     Heart sounds: Normal heart sounds.  Pulmonary:     Effort: Pulmonary effort is normal.     Breath sounds: Normal breath sounds.  Abdominal:     General: Bowel sounds are normal.     Palpations: Abdomen is soft.  Skin:    General: Skin is warm and dry.  Neurological:     Mental Status: She is alert and oriented to person, place, and time.      CMP Latest Ref Rng & Units 02/06/2020  Glucose 70 - 99 mg/dL 182(H)  BUN 8 - 23 mg/dL 33(H)  Creatinine 0.44 - 1.00 mg/dL 2.89(H)  Sodium 135 - 145 mmol/L 140  Potassium 3.5 - 5.1 mmol/L 4.2  Chloride 98 - 111 mmol/L 106  CO2 22 - 32 mmol/L 24  Calcium 8.9 - 10.3 mg/dL 9.3  Total Protein 6.5 - 8.1 g/dL 6.4(L)   Total Bilirubin 0.3 - 1.2 mg/dL 0.7  Alkaline Phos 38 - 126 U/L 76  AST 15 - 41 U/L 22  ALT 0 - 44 U/L 18   CBC Latest Ref Rng & Units 04/12/2021  WBC 4.0 - 10.5 K/uL 5.5  Hemoglobin 12.0 - 15.0 g/dL 11.1(L)  Hematocrit 36.0 - 46.0 % 33.2(L)  Platelets 150 - 400 K/uL 180     Assessment and plan- Patient is a 86 y.o. female with anemia of chronic kidney disease on Retacrit here for routine follow-up  Patient's hemoglobin is 11.1 today and she does not require Retacrit.  Overall hemoglobin has been stable between 10-11 with Retacrit 10 she will continue to get H&H every 4 weeks instead of every 3 weeks and receive Retacrit if hemoglobin less than 11.  I will see her back in 4 months.  Ferritin levels are normal at 126 with an iron saturation of greater than 20%.  She does not require IV iron at this time   Visit Diagnosis 1. Erythropoietin (EPO) stimulating agent anemia management patient   2. Anemia of chronic kidney failure, stage 3 (moderate) (HCC)      Dr. Randa Evens, MD, MPH [facility] [phone] 04/12/2021 1:29 PM

## 2021-04-18 ENCOUNTER — Other Ambulatory Visit: Payer: HMO

## 2021-04-18 ENCOUNTER — Ambulatory Visit: Payer: HMO

## 2021-04-18 ENCOUNTER — Ambulatory Visit: Payer: HMO | Admitting: Oncology

## 2021-04-19 DIAGNOSIS — I495 Sick sinus syndrome: Secondary | ICD-10-CM | POA: Diagnosis not present

## 2021-04-26 DIAGNOSIS — G4733 Obstructive sleep apnea (adult) (pediatric): Secondary | ICD-10-CM | POA: Diagnosis not present

## 2021-04-26 DIAGNOSIS — Z9989 Dependence on other enabling machines and devices: Secondary | ICD-10-CM | POA: Diagnosis not present

## 2021-04-26 DIAGNOSIS — I5032 Chronic diastolic (congestive) heart failure: Secondary | ICD-10-CM | POA: Diagnosis not present

## 2021-04-26 DIAGNOSIS — I495 Sick sinus syndrome: Secondary | ICD-10-CM | POA: Diagnosis not present

## 2021-04-26 DIAGNOSIS — I480 Paroxysmal atrial fibrillation: Secondary | ICD-10-CM | POA: Diagnosis not present

## 2021-04-26 DIAGNOSIS — Z950 Presence of cardiac pacemaker: Secondary | ICD-10-CM | POA: Diagnosis not present

## 2021-04-26 DIAGNOSIS — I10 Essential (primary) hypertension: Secondary | ICD-10-CM | POA: Diagnosis not present

## 2021-05-03 ENCOUNTER — Other Ambulatory Visit: Payer: HMO

## 2021-05-03 ENCOUNTER — Ambulatory Visit: Payer: HMO

## 2021-05-09 DIAGNOSIS — I480 Paroxysmal atrial fibrillation: Secondary | ICD-10-CM | POA: Diagnosis not present

## 2021-05-10 ENCOUNTER — Inpatient Hospital Stay: Payer: HMO | Attending: Oncology

## 2021-05-10 ENCOUNTER — Inpatient Hospital Stay: Payer: HMO

## 2021-05-10 VITALS — BP 122/68 | HR 84

## 2021-05-10 DIAGNOSIS — D509 Iron deficiency anemia, unspecified: Secondary | ICD-10-CM

## 2021-05-10 DIAGNOSIS — D649 Anemia, unspecified: Secondary | ICD-10-CM

## 2021-05-10 DIAGNOSIS — N184 Chronic kidney disease, stage 4 (severe): Secondary | ICD-10-CM | POA: Insufficient documentation

## 2021-05-10 DIAGNOSIS — D631 Anemia in chronic kidney disease: Secondary | ICD-10-CM | POA: Insufficient documentation

## 2021-05-10 DIAGNOSIS — N183 Chronic kidney disease, stage 3 unspecified: Secondary | ICD-10-CM

## 2021-05-10 LAB — HEMOGLOBIN AND HEMATOCRIT, BLOOD
HCT: 31.4 % — ABNORMAL LOW (ref 36.0–46.0)
Hemoglobin: 10.5 g/dL — ABNORMAL LOW (ref 12.0–15.0)

## 2021-05-10 MED ORDER — EPOETIN ALFA-EPBX 40000 UNIT/ML IJ SOLN
40000.0000 [IU] | INTRAMUSCULAR | Status: DC
  Administered 2021-05-10: 40000 [IU] via SUBCUTANEOUS
  Filled 2021-05-10: qty 1

## 2021-05-12 DIAGNOSIS — N2581 Secondary hyperparathyroidism of renal origin: Secondary | ICD-10-CM | POA: Diagnosis not present

## 2021-05-12 DIAGNOSIS — E1122 Type 2 diabetes mellitus with diabetic chronic kidney disease: Secondary | ICD-10-CM | POA: Diagnosis not present

## 2021-05-12 DIAGNOSIS — N184 Chronic kidney disease, stage 4 (severe): Secondary | ICD-10-CM | POA: Diagnosis not present

## 2021-05-12 DIAGNOSIS — I10 Essential (primary) hypertension: Secondary | ICD-10-CM | POA: Diagnosis not present

## 2021-05-12 DIAGNOSIS — D631 Anemia in chronic kidney disease: Secondary | ICD-10-CM | POA: Diagnosis not present

## 2021-05-16 DIAGNOSIS — R0602 Shortness of breath: Secondary | ICD-10-CM | POA: Diagnosis not present

## 2021-05-16 DIAGNOSIS — I5032 Chronic diastolic (congestive) heart failure: Secondary | ICD-10-CM | POA: Diagnosis not present

## 2021-05-16 DIAGNOSIS — I480 Paroxysmal atrial fibrillation: Secondary | ICD-10-CM | POA: Diagnosis not present

## 2021-05-16 DIAGNOSIS — E1122 Type 2 diabetes mellitus with diabetic chronic kidney disease: Secondary | ICD-10-CM | POA: Diagnosis not present

## 2021-05-16 DIAGNOSIS — N184 Chronic kidney disease, stage 4 (severe): Secondary | ICD-10-CM | POA: Diagnosis not present

## 2021-05-23 DIAGNOSIS — I10 Essential (primary) hypertension: Secondary | ICD-10-CM | POA: Diagnosis not present

## 2021-05-23 DIAGNOSIS — E7800 Pure hypercholesterolemia, unspecified: Secondary | ICD-10-CM | POA: Diagnosis not present

## 2021-05-30 DIAGNOSIS — N184 Chronic kidney disease, stage 4 (severe): Secondary | ICD-10-CM | POA: Diagnosis not present

## 2021-05-30 DIAGNOSIS — I10 Essential (primary) hypertension: Secondary | ICD-10-CM | POA: Diagnosis not present

## 2021-05-30 DIAGNOSIS — I480 Paroxysmal atrial fibrillation: Secondary | ICD-10-CM | POA: Diagnosis not present

## 2021-05-30 DIAGNOSIS — E7800 Pure hypercholesterolemia, unspecified: Secondary | ICD-10-CM | POA: Diagnosis not present

## 2021-05-30 DIAGNOSIS — Z0000 Encounter for general adult medical examination without abnormal findings: Secondary | ICD-10-CM | POA: Diagnosis not present

## 2021-05-31 DIAGNOSIS — E662 Morbid (severe) obesity with alveolar hypoventilation: Secondary | ICD-10-CM | POA: Diagnosis not present

## 2021-06-07 ENCOUNTER — Other Ambulatory Visit: Payer: Self-pay

## 2021-06-07 ENCOUNTER — Inpatient Hospital Stay: Payer: HMO | Attending: Oncology

## 2021-06-07 ENCOUNTER — Inpatient Hospital Stay: Payer: HMO

## 2021-06-07 DIAGNOSIS — D631 Anemia in chronic kidney disease: Secondary | ICD-10-CM | POA: Diagnosis not present

## 2021-06-07 DIAGNOSIS — D509 Iron deficiency anemia, unspecified: Secondary | ICD-10-CM

## 2021-06-07 DIAGNOSIS — N184 Chronic kidney disease, stage 4 (severe): Secondary | ICD-10-CM | POA: Insufficient documentation

## 2021-06-07 LAB — HEMOGLOBIN AND HEMATOCRIT, BLOOD
HCT: 32.7 % — ABNORMAL LOW (ref 36.0–46.0)
Hemoglobin: 11 g/dL — ABNORMAL LOW (ref 12.0–15.0)

## 2021-06-16 DIAGNOSIS — M1712 Unilateral primary osteoarthritis, left knee: Secondary | ICD-10-CM | POA: Diagnosis not present

## 2021-06-22 DIAGNOSIS — G4733 Obstructive sleep apnea (adult) (pediatric): Secondary | ICD-10-CM | POA: Diagnosis not present

## 2021-06-28 DIAGNOSIS — I480 Paroxysmal atrial fibrillation: Secondary | ICD-10-CM | POA: Diagnosis not present

## 2021-07-05 ENCOUNTER — Inpatient Hospital Stay: Payer: HMO

## 2021-07-05 ENCOUNTER — Inpatient Hospital Stay: Payer: HMO | Attending: Oncology

## 2021-07-05 VITALS — BP 117/75 | HR 85

## 2021-07-05 DIAGNOSIS — D649 Anemia, unspecified: Secondary | ICD-10-CM

## 2021-07-05 DIAGNOSIS — D509 Iron deficiency anemia, unspecified: Secondary | ICD-10-CM

## 2021-07-05 DIAGNOSIS — N184 Chronic kidney disease, stage 4 (severe): Secondary | ICD-10-CM | POA: Diagnosis not present

## 2021-07-05 DIAGNOSIS — N183 Chronic kidney disease, stage 3 unspecified: Secondary | ICD-10-CM

## 2021-07-05 DIAGNOSIS — D631 Anemia in chronic kidney disease: Secondary | ICD-10-CM | POA: Insufficient documentation

## 2021-07-05 LAB — CBC WITH DIFFERENTIAL/PLATELET
Abs Immature Granulocytes: 0.03 10*3/uL (ref 0.00–0.07)
Basophils Absolute: 0 10*3/uL (ref 0.0–0.1)
Basophils Relative: 0 %
Eosinophils Absolute: 0.1 10*3/uL (ref 0.0–0.5)
Eosinophils Relative: 2 %
HCT: 32.3 % — ABNORMAL LOW (ref 36.0–46.0)
Hemoglobin: 10.9 g/dL — ABNORMAL LOW (ref 12.0–15.0)
Immature Granulocytes: 1 %
Lymphocytes Relative: 15 %
Lymphs Abs: 0.9 10*3/uL (ref 0.7–4.0)
MCH: 27 pg (ref 26.0–34.0)
MCHC: 33.7 g/dL (ref 30.0–36.0)
MCV: 80.1 fL (ref 80.0–100.0)
Monocytes Absolute: 0.6 10*3/uL (ref 0.1–1.0)
Monocytes Relative: 9 %
Neutro Abs: 4.5 10*3/uL (ref 1.7–7.7)
Neutrophils Relative %: 73 %
Platelets: 133 10*3/uL — ABNORMAL LOW (ref 150–400)
RBC: 4.03 MIL/uL (ref 3.87–5.11)
RDW: 14.8 % (ref 11.5–15.5)
WBC: 6.2 10*3/uL (ref 4.0–10.5)
nRBC: 0 % (ref 0.0–0.2)

## 2021-07-05 LAB — COMPREHENSIVE METABOLIC PANEL
ALT: 19 U/L (ref 0–44)
AST: 24 U/L (ref 15–41)
Albumin: 3.8 g/dL (ref 3.5–5.0)
Alkaline Phosphatase: 73 U/L (ref 38–126)
Anion gap: 8 (ref 5–15)
BUN: 32 mg/dL — ABNORMAL HIGH (ref 8–23)
CO2: 29 mmol/L (ref 22–32)
Calcium: 8.9 mg/dL (ref 8.9–10.3)
Chloride: 101 mmol/L (ref 98–111)
Creatinine, Ser: 2.79 mg/dL — ABNORMAL HIGH (ref 0.44–1.00)
GFR, Estimated: 16 mL/min — ABNORMAL LOW (ref >60)
Glucose, Bld: 158 mg/dL — ABNORMAL HIGH (ref 70–99)
Potassium: 4.2 mmol/L (ref 3.5–5.1)
Sodium: 138 mmol/L (ref 135–145)
Total Bilirubin: 0.7 mg/dL (ref 0.3–1.2)
Total Protein: 6.4 g/dL — ABNORMAL LOW (ref 6.5–8.1)

## 2021-07-05 LAB — IRON AND TIBC
Iron: 66 ug/dL (ref 28–170)
Saturation Ratios: 25 % (ref 10.4–31.8)
TIBC: 269 ug/dL (ref 250–450)
UIBC: 203 ug/dL

## 2021-07-05 LAB — FERRITIN: Ferritin: 123 ng/mL (ref 11–307)

## 2021-07-05 MED ORDER — EPOETIN ALFA-EPBX 40000 UNIT/ML IJ SOLN: 40000.0000 [IU] | INTRAMUSCULAR | Status: DC

## 2021-07-07 DIAGNOSIS — I10 Essential (primary) hypertension: Secondary | ICD-10-CM | POA: Diagnosis not present

## 2021-07-07 DIAGNOSIS — D631 Anemia in chronic kidney disease: Secondary | ICD-10-CM | POA: Diagnosis not present

## 2021-07-07 DIAGNOSIS — E1122 Type 2 diabetes mellitus with diabetic chronic kidney disease: Secondary | ICD-10-CM | POA: Diagnosis not present

## 2021-07-07 DIAGNOSIS — N2581 Secondary hyperparathyroidism of renal origin: Secondary | ICD-10-CM | POA: Diagnosis not present

## 2021-07-08 DIAGNOSIS — R791 Abnormal coagulation profile: Secondary | ICD-10-CM | POA: Diagnosis not present

## 2021-07-12 DIAGNOSIS — L851 Acquired keratosis [keratoderma] palmaris et plantaris: Secondary | ICD-10-CM | POA: Diagnosis not present

## 2021-07-12 DIAGNOSIS — E1140 Type 2 diabetes mellitus with diabetic neuropathy, unspecified: Secondary | ICD-10-CM | POA: Diagnosis not present

## 2021-07-12 DIAGNOSIS — B351 Tinea unguium: Secondary | ICD-10-CM | POA: Diagnosis not present

## 2021-07-14 DIAGNOSIS — E042 Nontoxic multinodular goiter: Secondary | ICD-10-CM | POA: Diagnosis not present

## 2021-07-14 DIAGNOSIS — N184 Chronic kidney disease, stage 4 (severe): Secondary | ICD-10-CM | POA: Diagnosis not present

## 2021-07-14 DIAGNOSIS — E1122 Type 2 diabetes mellitus with diabetic chronic kidney disease: Secondary | ICD-10-CM | POA: Diagnosis not present

## 2021-07-15 DIAGNOSIS — M25562 Pain in left knee: Secondary | ICD-10-CM | POA: Diagnosis not present

## 2021-07-25 DIAGNOSIS — N184 Chronic kidney disease, stage 4 (severe): Secondary | ICD-10-CM | POA: Diagnosis not present

## 2021-07-25 DIAGNOSIS — I10 Essential (primary) hypertension: Secondary | ICD-10-CM | POA: Diagnosis not present

## 2021-08-01 ENCOUNTER — Other Ambulatory Visit: Payer: Self-pay | Admitting: *Deleted

## 2021-08-01 DIAGNOSIS — D631 Anemia in chronic kidney disease: Secondary | ICD-10-CM

## 2021-08-01 DIAGNOSIS — D649 Anemia, unspecified: Secondary | ICD-10-CM

## 2021-08-01 DIAGNOSIS — N183 Chronic kidney disease, stage 3 unspecified: Secondary | ICD-10-CM

## 2021-08-02 ENCOUNTER — Inpatient Hospital Stay: Payer: HMO

## 2021-08-02 DIAGNOSIS — I5032 Chronic diastolic (congestive) heart failure: Secondary | ICD-10-CM | POA: Diagnosis not present

## 2021-08-02 DIAGNOSIS — Z950 Presence of cardiac pacemaker: Secondary | ICD-10-CM | POA: Diagnosis not present

## 2021-08-02 DIAGNOSIS — N184 Chronic kidney disease, stage 4 (severe): Secondary | ICD-10-CM | POA: Diagnosis not present

## 2021-08-02 DIAGNOSIS — I495 Sick sinus syndrome: Secondary | ICD-10-CM | POA: Diagnosis not present

## 2021-08-02 DIAGNOSIS — G4733 Obstructive sleep apnea (adult) (pediatric): Secondary | ICD-10-CM | POA: Diagnosis not present

## 2021-08-02 DIAGNOSIS — E1122 Type 2 diabetes mellitus with diabetic chronic kidney disease: Secondary | ICD-10-CM | POA: Diagnosis not present

## 2021-08-02 DIAGNOSIS — I480 Paroxysmal atrial fibrillation: Secondary | ICD-10-CM | POA: Diagnosis not present

## 2021-08-02 DIAGNOSIS — Z9989 Dependence on other enabling machines and devices: Secondary | ICD-10-CM | POA: Diagnosis not present

## 2021-08-02 DIAGNOSIS — D649 Anemia, unspecified: Secondary | ICD-10-CM

## 2021-08-02 DIAGNOSIS — D631 Anemia in chronic kidney disease: Secondary | ICD-10-CM

## 2021-08-02 DIAGNOSIS — I10 Essential (primary) hypertension: Secondary | ICD-10-CM | POA: Diagnosis not present

## 2021-08-02 LAB — HEMOGLOBIN AND HEMATOCRIT, BLOOD
HCT: 32.2 % — ABNORMAL LOW (ref 36.0–46.0)
Hemoglobin: 10.9 g/dL — ABNORMAL LOW (ref 12.0–15.0)

## 2021-08-04 DIAGNOSIS — H40003 Preglaucoma, unspecified, bilateral: Secondary | ICD-10-CM | POA: Diagnosis not present

## 2021-08-04 DIAGNOSIS — H43823 Vitreomacular adhesion, bilateral: Secondary | ICD-10-CM | POA: Diagnosis not present

## 2021-08-05 DIAGNOSIS — I480 Paroxysmal atrial fibrillation: Secondary | ICD-10-CM | POA: Diagnosis not present

## 2021-08-09 DIAGNOSIS — R791 Abnormal coagulation profile: Secondary | ICD-10-CM | POA: Diagnosis not present

## 2021-08-12 ENCOUNTER — Other Ambulatory Visit: Payer: HMO

## 2021-08-12 ENCOUNTER — Ambulatory Visit: Payer: HMO | Admitting: Oncology

## 2021-08-12 ENCOUNTER — Ambulatory Visit: Payer: HMO

## 2021-08-17 DIAGNOSIS — I10 Essential (primary) hypertension: Secondary | ICD-10-CM | POA: Diagnosis not present

## 2021-08-17 DIAGNOSIS — R791 Abnormal coagulation profile: Secondary | ICD-10-CM | POA: Diagnosis not present

## 2021-08-23 DIAGNOSIS — I495 Sick sinus syndrome: Secondary | ICD-10-CM | POA: Diagnosis not present

## 2021-08-30 ENCOUNTER — Inpatient Hospital Stay (HOSPITAL_BASED_OUTPATIENT_CLINIC_OR_DEPARTMENT_OTHER): Payer: HMO | Admitting: Nurse Practitioner

## 2021-08-30 ENCOUNTER — Inpatient Hospital Stay: Payer: HMO

## 2021-08-30 ENCOUNTER — Encounter: Payer: Self-pay | Admitting: Nurse Practitioner

## 2021-08-30 ENCOUNTER — Inpatient Hospital Stay: Payer: HMO | Attending: Nurse Practitioner

## 2021-08-30 VITALS — BP 114/56 | HR 72 | Temp 97.0°F | Resp 20 | Wt 248.6 lb

## 2021-08-30 DIAGNOSIS — Z881 Allergy status to other antibiotic agents status: Secondary | ICD-10-CM | POA: Diagnosis not present

## 2021-08-30 DIAGNOSIS — Z800 Family history of malignant neoplasm of digestive organs: Secondary | ICD-10-CM | POA: Insufficient documentation

## 2021-08-30 DIAGNOSIS — Z8261 Family history of arthritis: Secondary | ICD-10-CM | POA: Insufficient documentation

## 2021-08-30 DIAGNOSIS — E1122 Type 2 diabetes mellitus with diabetic chronic kidney disease: Secondary | ICD-10-CM | POA: Diagnosis not present

## 2021-08-30 DIAGNOSIS — Z87440 Personal history of urinary (tract) infections: Secondary | ICD-10-CM | POA: Diagnosis not present

## 2021-08-30 DIAGNOSIS — Z8249 Family history of ischemic heart disease and other diseases of the circulatory system: Secondary | ICD-10-CM | POA: Insufficient documentation

## 2021-08-30 DIAGNOSIS — D631 Anemia in chronic kidney disease: Secondary | ICD-10-CM | POA: Insufficient documentation

## 2021-08-30 DIAGNOSIS — Z9049 Acquired absence of other specified parts of digestive tract: Secondary | ICD-10-CM | POA: Diagnosis not present

## 2021-08-30 DIAGNOSIS — D649 Anemia, unspecified: Secondary | ICD-10-CM | POA: Diagnosis not present

## 2021-08-30 DIAGNOSIS — D696 Thrombocytopenia, unspecified: Secondary | ICD-10-CM | POA: Insufficient documentation

## 2021-08-30 DIAGNOSIS — Z803 Family history of malignant neoplasm of breast: Secondary | ICD-10-CM | POA: Insufficient documentation

## 2021-08-30 DIAGNOSIS — Z79899 Other long term (current) drug therapy: Secondary | ICD-10-CM | POA: Insufficient documentation

## 2021-08-30 DIAGNOSIS — Z7901 Long term (current) use of anticoagulants: Secondary | ICD-10-CM | POA: Insufficient documentation

## 2021-08-30 DIAGNOSIS — N183 Chronic kidney disease, stage 3 unspecified: Secondary | ICD-10-CM

## 2021-08-30 DIAGNOSIS — R791 Abnormal coagulation profile: Secondary | ICD-10-CM | POA: Diagnosis not present

## 2021-08-30 DIAGNOSIS — N184 Chronic kidney disease, stage 4 (severe): Secondary | ICD-10-CM | POA: Diagnosis not present

## 2021-08-30 DIAGNOSIS — Z87891 Personal history of nicotine dependence: Secondary | ICD-10-CM | POA: Insufficient documentation

## 2021-08-30 LAB — CBC WITH DIFFERENTIAL/PLATELET
Abs Immature Granulocytes: 0.03 10*3/uL (ref 0.00–0.07)
Basophils Absolute: 0 10*3/uL (ref 0.0–0.1)
Basophils Relative: 0 %
Eosinophils Absolute: 0.2 10*3/uL (ref 0.0–0.5)
Eosinophils Relative: 3 %
HCT: 32.1 % — ABNORMAL LOW (ref 36.0–46.0)
Hemoglobin: 10.8 g/dL — ABNORMAL LOW (ref 12.0–15.0)
Immature Granulocytes: 1 %
Lymphocytes Relative: 21 %
Lymphs Abs: 1.3 10*3/uL (ref 0.7–4.0)
MCH: 27.1 pg (ref 26.0–34.0)
MCHC: 33.6 g/dL (ref 30.0–36.0)
MCV: 80.5 fL (ref 80.0–100.0)
Monocytes Absolute: 0.5 10*3/uL (ref 0.1–1.0)
Monocytes Relative: 9 %
Neutro Abs: 3.9 10*3/uL (ref 1.7–7.7)
Neutrophils Relative %: 66 %
Platelets: 140 10*3/uL — ABNORMAL LOW (ref 150–400)
RBC: 3.99 MIL/uL (ref 3.87–5.11)
RDW: 15.7 % — ABNORMAL HIGH (ref 11.5–15.5)
WBC: 5.9 10*3/uL (ref 4.0–10.5)
nRBC: 0 % (ref 0.0–0.2)

## 2021-08-30 LAB — COMPREHENSIVE METABOLIC PANEL
ALT: 22 U/L (ref 0–44)
AST: 27 U/L (ref 15–41)
Albumin: 4 g/dL (ref 3.5–5.0)
Alkaline Phosphatase: 72 U/L (ref 38–126)
Anion gap: 9 (ref 5–15)
BUN: 27 mg/dL — ABNORMAL HIGH (ref 8–23)
CO2: 27 mmol/L (ref 22–32)
Calcium: 9.5 mg/dL (ref 8.9–10.3)
Chloride: 103 mmol/L (ref 98–111)
Creatinine, Ser: 2.63 mg/dL — ABNORMAL HIGH (ref 0.44–1.00)
GFR, Estimated: 18 mL/min — ABNORMAL LOW (ref >60)
Glucose, Bld: 169 mg/dL — ABNORMAL HIGH (ref 70–99)
Potassium: 4 mmol/L (ref 3.5–5.1)
Sodium: 139 mmol/L (ref 135–145)
Total Bilirubin: 1 mg/dL (ref 0.3–1.2)
Total Protein: 7 g/dL (ref 6.5–8.1)

## 2021-08-30 LAB — IRON AND TIBC
Iron: 65 ug/dL (ref 28–170)
Saturation Ratios: 23 % (ref 10.4–31.8)
TIBC: 279 ug/dL (ref 250–450)
UIBC: 214 ug/dL

## 2021-08-30 LAB — FERRITIN: Ferritin: 134 ng/mL (ref 11–307)

## 2021-08-30 MED ORDER — EPOETIN ALFA-EPBX 40000 UNIT/ML IJ SOLN
40000.0000 [IU] | Freq: Once | INTRAMUSCULAR | Status: AC
  Administered 2021-08-30: 40000 [IU] via SUBCUTANEOUS
  Filled 2021-08-30: qty 1

## 2021-08-30 NOTE — Progress Notes (Signed)
Hematology/Oncology Consult Note [facility]  [phone] [phone]  Patient Care Team: Dion Body, MD as PCP - General (Family Medicine) Minna Merritts, MD as Consulting Physician (Cardiology) Anthonette Legato, MD as Consulting Physician (Nephrology) Sizer Lance, MD as Consulting Physician (Cardiology) Lorelee Cover., MD (Ophthalmology) Edrick Kins, DPM as Consulting Physician (Podiatry) Sindy Guadeloupe, MD as Consulting Physician (Hematology and Oncology)   Name of the patient: [name] [name]  [id number]  Aug 24, 1935   Date of visit: 08/30/21  Diagnosis- chronic anemia multifactorial secondary to anemia of chronic kidney disease and a component of iron deficiency   Chief complaint/ Reason for visit-routine follow-up of anemia of chronic kidney disease for Retacrit  Heme/Onc history: Patient is a 86 year old female with history of anemia of chronic kidney disease for which she is on Retacrit.  She also has hemoglobin C trait.  She has received IV iron in the past as well.  Hemoglobin has been stable between 10-11 on Retacrit  Interval history-patient is 86 year old female with above history of chronic kidney disease as well as iron deficiency who returns to clinic for labs, further evaluation and consideration of IV iron and erythropoietin. She feels well and denies specific complaints. Denies any neurologic complaints. Denies recent fevers or illnesses. Denies any easy bleeding or bruising. No melena or hematochezia. No pica or restless leg. Reports good appetite and denies weight loss. Denies chest pain. Denies any nausea, vomiting, constipation, or diarrhea. Denies urinary complaints.   ECOG PS- 1 Pain scale- 0  Review of systems- Review of Systems  Constitutional:  Negative for chills, fever, malaise/fatigue and weight loss.  HENT:  Negative for congestion, ear discharge and nosebleeds.   Eyes:  Negative for blurred  vision.  Respiratory:  Negative for cough, hemoptysis, sputum production, shortness of breath and wheezing.   Cardiovascular:  Negative for chest pain, palpitations, orthopnea and claudication.  Gastrointestinal:  Negative for abdominal pain, blood in stool, constipation, diarrhea, heartburn, melena, nausea and vomiting.  Genitourinary:  Negative for dysuria, flank pain, frequency, hematuria and urgency.  Musculoskeletal:  Negative for back pain, joint pain and myalgias.  Skin:  Negative for rash.  Neurological:  Negative for dizziness, tingling, focal weakness, seizures, weakness and headaches.  Endo/Heme/Allergies:  Does not bruise/bleed easily.  Psychiatric/Behavioral:  Negative for depression and suicidal ideas. The patient does not have insomnia.     Allergies  Allergen Reactions   Metoprolol Other (See Comments)    Bad dreams Patient stated that she had suicidal thoughts while taking this medication.    Diltiazem Other (See Comments)    Patient stated that she had bad dreams with this medication   Levofloxacin Nausea And Vomiting and Nausea Only        Past Medical History:  Diagnosis Date   1st degree AV block    a. PR 400 msec with Apacing   A-fib ([place])    Hx of   Anemia    Anxiety    Arthritis    knees and lower back    Chest pain    a. 2004 Cath:  reportedly nl;  b. 09/2013 Neg MV.   Chronic diastolic heart failure ([place])    a. 09/2013 Echo: EF 50-55%, mild LVH, mild to mod MR/TR.   Chronic kidney disease    Stage IV   Congestive heart disease (HCC)    Esophageal reflux    Gout    H/O hiatal hernia    History of UTI  Hyperlipidemia    Morbid obesity ([place])    OSA on CPAP    CPAP   Pacemaker    copy of medtronic card in chart   PAF (paroxysmal atrial fibrillation) (HCC)    Seasonal allergies    SSS (sick sinus syndrome) ([place])    a. 2008 s/p MDT PPM;  b. 11/2013 Gen change- MDT ADDRL1 Adapta DC PPM, ser # [id number].   Symptomatic PVCs    Syncope and  collapse    a. Felt to be vasovagal.   Thyroid disease    nodules on thyroid, with goiter, on no meds per pt   Type II diabetes mellitus ([place])    Type 2   Unspecified essential hypertension    controlled on meds   Unspecified glaucoma(365.9)    Vertigo    in past   Wears dentures    upper full and lower partial   Past Surgical History:  Procedure Laterality Date   APPENDECTOMY     CARDIAC CATHETERIZATION     CARDIOVERSION N/A 04/26/2017   Procedure: Cardioversion;  Surgeon: Minna Merritts, MD;  Location: [facility] OR[facility];  Service: Cardiovascular;  Laterality: N/A;   CATARACT EXTRACTION W/PHACO Left 07/21/2020   Procedure: CATARACT EXTRACTION PHACO AND INTRAOCULAR LENS PLACEMENT ([place]) LEFT DIABETIC;  Surgeon: Leandrew Koyanagi, MD;  Location: [place];  Service: Ophthalmology;  Laterality: Left;  10.25 1:13.2 14.0%   CATARACT EXTRACTION W/PHACO Right 08/11/2020   Procedure: CATARACT EXTRACTION PHACO AND INTRAOCULAR LENS PLACEMENT ([place]) RIGHT DIABETIC;  Surgeon: Leandrew Koyanagi, MD;  Location: [place];  Service: Ophthalmology;  Laterality: Right;  11.63 1:25.0 13.7%   DILATION AND CURETTAGE OF UTERUS     INCISION AND DRAINAGE OF WOUND Right 03/1998   "leg; it was like a boil" (12/01/2013)   INSERT / REPLACE / REMOVE PACEMAKER  2008; 12/01/2013   MDT ADDRL1 pacemaker - gen change by Dr Caryl Comes 12/01/2013   LUNG BIOPSY  2010   unc   PERMANENT PACEMAKER GENERATOR CHANGE N/A 12/01/2013   Procedure: PERMANENT PACEMAKER GENERATOR CHANGE;  Surgeon: Deboraha Sprang, MD;  Location: [facility] CATH LAB;  Service: Cardiovascular;  Laterality: N/A;   VAGINAL HYSTERECTOMY  1970   Social History   Socioeconomic History   Marital status: Widowed    Spouse name: Not on file   Number of children: Y   Years of education: Not on file   Highest education level: Not on file  Occupational History   Occupation: retired  Tobacco Use   Smoking status: Former    Packs/day: 0.50     Years: 34.00    Pack years: 17.00    Types: Cigarettes    Quit date: 08/15/1989    Years since quitting: 32.0   Smokeless tobacco: Never  Vaping Use   Vaping Use: Never used  Substance and Sexual Activity   Alcohol use: No    Alcohol/week: 0.0 standard drinks   Drug use: No   Sexual activity: Never  Other Topics Concern   Not on file  Social History Narrative   Retired. Widowed. Regularly exercises.    Social Determinants of Health   Financial Resource Strain: Not on file  Food Insecurity: Not on file  Transportation Needs: Not on file  Physical Activity: Not on file  Stress: Not on file  Social Connections: Not on file  Intimate Partner Violence: Not on file   Family History  Problem Relation Age of Onset   Heart disease Father    Rheum arthritis  Father    Allergies Father    Liver cancer Mother    Pancreatic cancer Mother    Hypertension Sister    Rheum arthritis Sister    Breast cancer Maternal Aunt    Current Outpatient Medications:    acetaminophen (TYLENOL) 500 MG tablet, Take 1,000 mg by mouth every 6 (six) hours as needed for moderate pain. , Disp: , Rfl:    allopurinol (ZYLOPRIM) 100 MG tablet, Take 1 tablet by mouth daily., Disp: , Rfl:    amiodarone (PACERONE) 200 MG tablet, Take 100 mg by mouth 2 (two) times daily., Disp: , Rfl:    amLODipine (NORVASC) 5 MG tablet, Take 5 mg by mouth daily., Disp: , Rfl:    atorvastatin (LIPITOR) 20 MG tablet, TAKE 1 TABLET (20 MG TOTAL) BY MOUTH DAILY. (Patient taking differently: Take 20 mg by mouth at bedtime.), Disp: 90 tablet, Rfl: 0   azelastine (ASTELIN) 0.1 % nasal spray, Place into the nose., Disp: , Rfl:    Blood Glucose Monitoring Suppl (FIFTY50 GLUCOSE METER 2.0) w/Device KIT, Use as directed ONE TOUCH DX E11.22, Disp: , Rfl:    calcitRIOL (ROCALTROL) 0.25 MCG capsule, TAKE 1 CAPSULE (0.25 MCG TOTAL) BY MOUTH 1 (ONE) TIME EACH DAY, Disp: , Rfl:    carvedilol (COREG) 25 MG tablet, Take 25-50 mg by mouth See admin  instructions. Take 1 tablet (25MG) by mouth every morning and 2 tablets (50MG) by mouth every night before bed, Disp: , Rfl:    diclofenac sodium (VOLTAREN) 1 % GEL, Apply 2 g topically 4 (four) times daily., Disp: , Rfl:    ferrous sulfate 325 (65 FE) MG EC tablet, Take 325 mg by mouth daily. , Disp: , Rfl:    furosemide (LASIX) 20 MG tablet, Take 1 tablet (20 mg total) by mouth daily., Disp: 90 tablet, Rfl: 0   glipiZIDE (GLUCOTROL) 10 MG tablet, TAKE 1 TABLET (10 MG TOTAL) BY MOUTH 2 (TWO) TIMES DAILY BEFORE A MEAL., Disp: 180 tablet, Rfl: 0   Glycerin, Laxative, (FLEET LIQUID GLYCERIN SUPP RE), Place 1 Dose rectally daily as needed (constipation)., Disp: , Rfl:    KLOR-CON M10 10 MEQ tablet, Take 1 tablet by mouth daily., Disp: , Rfl: 3   mometasone (ELOCON) 0.1 % cream, Apply topically daily. Apply to affected area as directed two times a day, Disp: 45 g, Rfl: 0   Multiple Vitamins-Minerals (CENTRUM SILVER PO), Take 1 tablet by mouth daily. , Disp: , Rfl:    olmesartan (BENICAR) 40 MG tablet, Take 1 tablet by mouth daily., Disp: , Rfl:    OneTouch Delica Lancets 19T MISC, 3 (three) times daily Use as instructed., Disp: , Rfl:    ONETOUCH VERIO test strip, 3 (three) times daily., Disp: , Rfl:    pantoprazole (PROTONIX) 40 MG tablet, Take 1 tablet by mouth daily., Disp: , Rfl:    polyethylene glycol (MIRALAX / GLYCOLAX) packet, Take 17 g by mouth daily as needed for moderate constipation., Disp: , Rfl:    sitaGLIPtin (JANUVIA) 25 MG tablet, Take by mouth., Disp: , Rfl:    warfarin (COUMADIN) 1 MG tablet, Take 1 mg by mouth daily., Disp: , Rfl:    warfarin (COUMADIN) 3 MG tablet, TAKE 1-1.5 TABLETS DAILY AT 6 PM. TAKE 1 TAB ON TUES, THURS, SAT, SUN. TAKE 4.5 MG ON MON, WED, FRI (Patient taking differently: 14m tues and Thursday, 233mmon,wed,Friday, Saturday and sunday), Disp: 100 tablet, Rfl: 3   amLODipine (NORVASC) 10 MG tablet, TAKE 1  TABLET (10 MG TOTAL) BY MOUTH DAILY. (Patient not taking:  Reported on 08/30/2021), Disp: 90 tablet, Rfl: 0   esomeprazole (NEXIUM) 40 MG capsule, Take by mouth. (Patient not taking: Reported on 08/30/2021), Disp: , Rfl:    metFORMIN (GLUCOPHAGE-XR) 500 MG 24 hr tablet, Take by mouth. (Patient not taking: Reported on 08/30/2021), Disp: , Rfl:    simethicone (MYLICON) 40 YV/8.5FY drops, Take by mouth. (Patient not taking: Reported on 08/30/2021), Disp: , Rfl:    traZODone (DESYREL) 50 MG tablet, Take 50 mg by mouth at bedtime. (Patient not taking: Reported on 08/30/2021), Disp: , Rfl:   Physical exam:  Vitals:   08/30/21 1001  Resp: 20  Temp: (!) 97 F (36.1 C)  Weight: 248 lb 9.6 oz (112.8 kg)   Physical Exam Constitutional:      General: She is not in acute distress. Cardiovascular:     Rate and Rhythm: Normal rate and regular rhythm.     Heart sounds: Normal heart sounds.  Pulmonary:     Effort: Pulmonary effort is normal.     Breath sounds: Normal breath sounds.  Abdominal:     General: Bowel sounds are normal.     Palpations: Abdomen is soft.  Skin:    General: Skin is warm and dry.  Neurological:     Mental Status: She is alert and oriented to person, place, and time.     CMP Latest Ref Rng & Units 08/30/2021  Glucose 70 - 99 mg/dL 169(H)  BUN 8 - 23 mg/dL 27(H)  Creatinine 0.44 - 1.00 mg/dL 2.63(H)  Sodium 135 - 145 mmol/L 139  Potassium 3.5 - 5.1 mmol/L 4.0  Chloride 98 - 111 mmol/L 103  CO2 22 - 32 mmol/L 27  Calcium 8.9 - 10.3 mg/dL 9.5  Total Protein 6.5 - 8.1 g/dL 7.0  Total Bilirubin 0.3 - 1.2 mg/dL 1.0  Alkaline Phos 38 - 126 U/L 72  AST 15 - 41 U/L 27  ALT 0 - 44 U/L 22   CBC Latest Ref Rng & Units 08/30/2021  WBC 4.0 - 10.5 K/uL 5.9  Hemoglobin 12.0 - 15.0 g/dL 10.8(L)  Hematocrit 36.0 - 46.0 % 32.1(L)  Platelets 150 - 400 K/uL 140(L)    Assessment and plan- Patient is a 86 y.o. female with anemia of chronic kidney disease, currently on Retacrit, who returns to clinic for routine follow-up.  Hemoglobin is 10.8  today.  Hemoglobin ranges between 10 and 11.  She receives Retacrit if hemoglobin is less than 11.  Retacrit today.  Continue H&H every 4 weeks, +/- retacrit. RTC in 4 months for labs, Janese Banks, possible retacrit.   Iron deficiency - -iron studies pending at time of dictation.  No microcytosis and clinically asymptomatic.  Previous iron studies were reviewed and were normal.  Ferritin of 123, iron saturation of 25%.  TIBC normal.  No indication for IV iron at this time.  CKD- creatinine 2.63, BUN 27. Stage 4 CKD.  Worse. Follow up with nephrology.   Thrombocytopenia-platelet count 140.  Monitor.  Visit Diagnosis 1. Erythropoietin (EPO) stimulating agent anemia management patient   2. Anemia due to chronic renal failure treated with erythropoietin, stage 4 (severe) ([place])    Beckey Rutter, DNP, AGNP-C [place] at [facility] [phone] (clinic) 08/30/2021

## 2021-09-06 DIAGNOSIS — D631 Anemia in chronic kidney disease: Secondary | ICD-10-CM | POA: Diagnosis not present

## 2021-09-06 DIAGNOSIS — N184 Chronic kidney disease, stage 4 (severe): Secondary | ICD-10-CM | POA: Diagnosis not present

## 2021-09-06 DIAGNOSIS — I10 Essential (primary) hypertension: Secondary | ICD-10-CM | POA: Diagnosis not present

## 2021-09-06 DIAGNOSIS — N2581 Secondary hyperparathyroidism of renal origin: Secondary | ICD-10-CM | POA: Diagnosis not present

## 2021-09-06 DIAGNOSIS — E1122 Type 2 diabetes mellitus with diabetic chronic kidney disease: Secondary | ICD-10-CM | POA: Diagnosis not present

## 2021-09-08 DIAGNOSIS — H26491 Other secondary cataract, right eye: Secondary | ICD-10-CM | POA: Diagnosis not present

## 2021-09-09 ENCOUNTER — Other Ambulatory Visit: Payer: Self-pay | Admitting: Family Medicine

## 2021-09-09 DIAGNOSIS — Z1231 Encounter for screening mammogram for malignant neoplasm of breast: Secondary | ICD-10-CM

## 2021-09-13 ENCOUNTER — Encounter: Payer: Self-pay | Admitting: Oncology

## 2021-09-26 DIAGNOSIS — I480 Paroxysmal atrial fibrillation: Secondary | ICD-10-CM | POA: Diagnosis not present

## 2021-09-27 ENCOUNTER — Telehealth: Payer: Self-pay | Admitting: Oncology

## 2021-09-27 ENCOUNTER — Other Ambulatory Visit: Payer: Self-pay

## 2021-09-27 ENCOUNTER — Inpatient Hospital Stay: Payer: HMO | Attending: Oncology

## 2021-09-27 ENCOUNTER — Inpatient Hospital Stay: Payer: HMO

## 2021-09-27 VITALS — BP 111/67 | HR 73

## 2021-09-27 DIAGNOSIS — N184 Chronic kidney disease, stage 4 (severe): Secondary | ICD-10-CM | POA: Insufficient documentation

## 2021-09-27 DIAGNOSIS — G4733 Obstructive sleep apnea (adult) (pediatric): Secondary | ICD-10-CM | POA: Diagnosis not present

## 2021-09-27 DIAGNOSIS — D631 Anemia in chronic kidney disease: Secondary | ICD-10-CM

## 2021-09-27 LAB — CBC WITH DIFFERENTIAL/PLATELET
Abs Immature Granulocytes: 0.03 10*3/uL (ref 0.00–0.07)
Basophils Absolute: 0 10*3/uL (ref 0.0–0.1)
Basophils Relative: 1 %
Eosinophils Absolute: 0.1 10*3/uL (ref 0.0–0.5)
Eosinophils Relative: 2 %
HCT: 31 % — ABNORMAL LOW (ref 36.0–46.0)
Hemoglobin: 10.4 g/dL — ABNORMAL LOW (ref 12.0–15.0)
Immature Granulocytes: 1 %
Lymphocytes Relative: 22 %
Lymphs Abs: 1.2 10*3/uL (ref 0.7–4.0)
MCH: 27 pg (ref 26.0–34.0)
MCHC: 33.5 g/dL (ref 30.0–36.0)
MCV: 80.5 fL (ref 80.0–100.0)
Monocytes Absolute: 0.5 10*3/uL (ref 0.1–1.0)
Monocytes Relative: 9 %
Neutro Abs: 3.7 10*3/uL (ref 1.7–7.7)
Neutrophils Relative %: 65 %
Platelets: 147 10*3/uL — ABNORMAL LOW (ref 150–400)
RBC: 3.85 MIL/uL — ABNORMAL LOW (ref 3.87–5.11)
RDW: 15.6 % — ABNORMAL HIGH (ref 11.5–15.5)
WBC: 5.6 10*3/uL (ref 4.0–10.5)
nRBC: 0 % (ref 0.0–0.2)

## 2021-09-27 LAB — IRON AND TIBC
Iron: 61 ug/dL (ref 28–170)
Saturation Ratios: 23 % (ref 10.4–31.8)
TIBC: 266 ug/dL (ref 250–450)
UIBC: 205 ug/dL

## 2021-09-27 LAB — COMPREHENSIVE METABOLIC PANEL
ALT: 23 U/L (ref 0–44)
AST: 27 U/L (ref 15–41)
Albumin: 3.7 g/dL (ref 3.5–5.0)
Alkaline Phosphatase: 69 U/L (ref 38–126)
Anion gap: 8 (ref 5–15)
BUN: 33 mg/dL — ABNORMAL HIGH (ref 8–23)
CO2: 27 mmol/L (ref 22–32)
Calcium: 9 mg/dL (ref 8.9–10.3)
Chloride: 102 mmol/L (ref 98–111)
Creatinine, Ser: 2.9 mg/dL — ABNORMAL HIGH (ref 0.44–1.00)
GFR, Estimated: 16 mL/min — ABNORMAL LOW (ref >60)
Glucose, Bld: 160 mg/dL — ABNORMAL HIGH (ref 70–99)
Potassium: 4.1 mmol/L (ref 3.5–5.1)
Sodium: 137 mmol/L (ref 135–145)
Total Bilirubin: 1 mg/dL (ref 0.3–1.2)
Total Protein: 6.6 g/dL (ref 6.5–8.1)

## 2021-09-27 LAB — FERRITIN: Ferritin: 103 ng/mL (ref 11–307)

## 2021-09-27 MED ORDER — EPOETIN ALFA-EPBX 40000 UNIT/ML IJ SOLN
40000.0000 [IU] | Freq: Once | INTRAMUSCULAR | Status: AC
  Administered 2021-09-27: 40000 [IU] via SUBCUTANEOUS
  Filled 2021-09-27: qty 1

## 2021-09-27 NOTE — Telephone Encounter (Signed)
Please look into this

## 2021-09-28 ENCOUNTER — Ambulatory Visit
Admission: RE | Admit: 2021-09-28 | Discharge: 2021-09-28 | Disposition: A | Discharge status: Home / Self Care | Payer: HMO | Source: Ambulatory Visit | Attending: Family Medicine | Admitting: Family Medicine

## 2021-09-28 DIAGNOSIS — Z1231 Encounter for screening mammogram for malignant neoplasm of breast: Secondary | ICD-10-CM | POA: Insufficient documentation

## 2021-10-12 DIAGNOSIS — B351 Tinea unguium: Secondary | ICD-10-CM | POA: Diagnosis not present

## 2021-10-12 DIAGNOSIS — L851 Acquired keratosis [keratoderma] palmaris et plantaris: Secondary | ICD-10-CM | POA: Diagnosis not present

## 2021-10-12 DIAGNOSIS — E119 Type 2 diabetes mellitus without complications: Secondary | ICD-10-CM | POA: Diagnosis not present

## 2021-10-20 DIAGNOSIS — I5032 Chronic diastolic (congestive) heart failure: Secondary | ICD-10-CM | POA: Diagnosis not present

## 2021-10-20 DIAGNOSIS — E7800 Pure hypercholesterolemia, unspecified: Secondary | ICD-10-CM | POA: Diagnosis not present

## 2021-10-20 DIAGNOSIS — E1122 Type 2 diabetes mellitus with diabetic chronic kidney disease: Secondary | ICD-10-CM | POA: Diagnosis not present

## 2021-10-20 DIAGNOSIS — I10 Essential (primary) hypertension: Secondary | ICD-10-CM | POA: Diagnosis not present

## 2021-10-20 DIAGNOSIS — G4733 Obstructive sleep apnea (adult) (pediatric): Secondary | ICD-10-CM | POA: Diagnosis not present

## 2021-10-20 DIAGNOSIS — I495 Sick sinus syndrome: Secondary | ICD-10-CM | POA: Diagnosis not present

## 2021-10-20 DIAGNOSIS — Z950 Presence of cardiac pacemaker: Secondary | ICD-10-CM | POA: Diagnosis not present

## 2021-10-20 DIAGNOSIS — Z9989 Dependence on other enabling machines and devices: Secondary | ICD-10-CM | POA: Diagnosis not present

## 2021-10-24 DIAGNOSIS — I5032 Chronic diastolic (congestive) heart failure: Secondary | ICD-10-CM | POA: Diagnosis not present

## 2021-10-24 DIAGNOSIS — I495 Sick sinus syndrome: Secondary | ICD-10-CM | POA: Diagnosis not present

## 2021-10-24 DIAGNOSIS — I480 Paroxysmal atrial fibrillation: Secondary | ICD-10-CM | POA: Diagnosis not present

## 2021-10-25 ENCOUNTER — Other Ambulatory Visit: Payer: Self-pay

## 2021-10-25 ENCOUNTER — Inpatient Hospital Stay: Payer: HMO

## 2021-10-25 ENCOUNTER — Inpatient Hospital Stay: Payer: HMO | Attending: Oncology

## 2021-10-25 DIAGNOSIS — N184 Chronic kidney disease, stage 4 (severe): Secondary | ICD-10-CM | POA: Insufficient documentation

## 2021-10-25 DIAGNOSIS — D631 Anemia in chronic kidney disease: Secondary | ICD-10-CM

## 2021-10-25 LAB — HEMOGLOBIN AND HEMATOCRIT, BLOOD
HCT: 34.8 % — ABNORMAL LOW (ref 36.0–46.0)
Hemoglobin: 11.7 g/dL — ABNORMAL LOW (ref 12.0–15.0)

## 2021-11-01 ENCOUNTER — Telehealth: Payer: Self-pay

## 2021-11-01 NOTE — Telephone Encounter (Signed)
Called, no answer and left message to inform patient labs are within normal limits per NP and to follow-up at next appointment.

## 2021-11-03 DIAGNOSIS — N184 Chronic kidney disease, stage 4 (severe): Secondary | ICD-10-CM | POA: Diagnosis not present

## 2021-11-03 DIAGNOSIS — B9789 Other viral agents as the cause of diseases classified elsewhere: Secondary | ICD-10-CM | POA: Diagnosis not present

## 2021-11-03 DIAGNOSIS — E1122 Type 2 diabetes mellitus with diabetic chronic kidney disease: Secondary | ICD-10-CM | POA: Diagnosis not present

## 2021-11-03 DIAGNOSIS — J028 Acute pharyngitis due to other specified organisms: Secondary | ICD-10-CM | POA: Diagnosis not present

## 2021-11-03 DIAGNOSIS — E042 Nontoxic multinodular goiter: Secondary | ICD-10-CM | POA: Diagnosis not present

## 2021-11-08 DIAGNOSIS — N184 Chronic kidney disease, stage 4 (severe): Secondary | ICD-10-CM | POA: Diagnosis not present

## 2021-11-08 DIAGNOSIS — D631 Anemia in chronic kidney disease: Secondary | ICD-10-CM | POA: Diagnosis not present

## 2021-11-08 DIAGNOSIS — N2581 Secondary hyperparathyroidism of renal origin: Secondary | ICD-10-CM | POA: Diagnosis not present

## 2021-11-08 DIAGNOSIS — E1122 Type 2 diabetes mellitus with diabetic chronic kidney disease: Secondary | ICD-10-CM | POA: Diagnosis not present

## 2021-11-08 DIAGNOSIS — I10 Essential (primary) hypertension: Secondary | ICD-10-CM | POA: Diagnosis not present

## 2021-11-22 ENCOUNTER — Inpatient Hospital Stay: Payer: HMO

## 2021-11-22 ENCOUNTER — Inpatient Hospital Stay: Payer: HMO | Attending: Oncology

## 2021-11-22 DIAGNOSIS — E7800 Pure hypercholesterolemia, unspecified: Secondary | ICD-10-CM | POA: Diagnosis not present

## 2021-11-22 DIAGNOSIS — I480 Paroxysmal atrial fibrillation: Secondary | ICD-10-CM | POA: Diagnosis not present

## 2021-11-23 ENCOUNTER — Telehealth: Payer: Self-pay | Admitting: Oncology

## 2021-11-23 NOTE — Telephone Encounter (Signed)
Pt called to let us know that she missed her appt on 12-20 and she is going to wait till her Jan. Appt. To come in.

## 2021-11-29 DIAGNOSIS — E7800 Pure hypercholesterolemia, unspecified: Secondary | ICD-10-CM | POA: Diagnosis not present

## 2021-11-29 DIAGNOSIS — N184 Chronic kidney disease, stage 4 (severe): Secondary | ICD-10-CM | POA: Diagnosis not present

## 2021-11-29 DIAGNOSIS — I10 Essential (primary) hypertension: Secondary | ICD-10-CM | POA: Diagnosis not present

## 2021-12-13 ENCOUNTER — Encounter: Payer: Self-pay | Admitting: Oncology

## 2021-12-14 ENCOUNTER — Encounter: Payer: Self-pay | Admitting: Oncology

## 2021-12-15 ENCOUNTER — Other Ambulatory Visit: Payer: Self-pay | Admitting: *Deleted

## 2021-12-15 DIAGNOSIS — N184 Chronic kidney disease, stage 4 (severe): Secondary | ICD-10-CM

## 2021-12-15 DIAGNOSIS — D631 Anemia in chronic kidney disease: Secondary | ICD-10-CM

## 2021-12-15 DIAGNOSIS — D649 Anemia, unspecified: Secondary | ICD-10-CM

## 2021-12-15 DIAGNOSIS — Z79899 Other long term (current) drug therapy: Secondary | ICD-10-CM

## 2021-12-20 ENCOUNTER — Other Ambulatory Visit: Payer: Self-pay

## 2021-12-20 ENCOUNTER — Inpatient Hospital Stay: Payer: HMO

## 2021-12-20 ENCOUNTER — Inpatient Hospital Stay: Payer: HMO | Attending: Oncology

## 2021-12-20 ENCOUNTER — Inpatient Hospital Stay (HOSPITAL_BASED_OUTPATIENT_CLINIC_OR_DEPARTMENT_OTHER): Payer: HMO | Admitting: Oncology

## 2021-12-20 ENCOUNTER — Encounter: Payer: Self-pay | Admitting: Oncology

## 2021-12-20 VITALS — BP 126/73 | HR 73 | Temp 98.9°F | Wt 255.7 lb

## 2021-12-20 DIAGNOSIS — N183 Chronic kidney disease, stage 3 unspecified: Secondary | ICD-10-CM

## 2021-12-20 DIAGNOSIS — I480 Paroxysmal atrial fibrillation: Secondary | ICD-10-CM | POA: Diagnosis not present

## 2021-12-20 DIAGNOSIS — D649 Anemia, unspecified: Secondary | ICD-10-CM | POA: Diagnosis not present

## 2021-12-20 DIAGNOSIS — Z79899 Other long term (current) drug therapy: Secondary | ICD-10-CM | POA: Diagnosis not present

## 2021-12-20 DIAGNOSIS — D631 Anemia in chronic kidney disease: Secondary | ICD-10-CM

## 2021-12-20 DIAGNOSIS — D509 Iron deficiency anemia, unspecified: Secondary | ICD-10-CM | POA: Diagnosis not present

## 2021-12-20 DIAGNOSIS — N1831 Chronic kidney disease, stage 3a: Secondary | ICD-10-CM

## 2021-12-20 DIAGNOSIS — N184 Chronic kidney disease, stage 4 (severe): Secondary | ICD-10-CM

## 2021-12-20 LAB — CBC WITH DIFFERENTIAL/PLATELET
Abs Immature Granulocytes: 0.03 10*3/uL (ref 0.00–0.07)
Basophils Absolute: 0 10*3/uL (ref 0.0–0.1)
Basophils Relative: 0 %
Eosinophils Absolute: 0.1 10*3/uL (ref 0.0–0.5)
Eosinophils Relative: 2 %
HCT: 32.1 % — ABNORMAL LOW (ref 36.0–46.0)
Hemoglobin: 11 g/dL — ABNORMAL LOW (ref 12.0–15.0)
Immature Granulocytes: 1 %
Lymphocytes Relative: 22 %
Lymphs Abs: 1.3 10*3/uL (ref 0.7–4.0)
MCH: 27 pg (ref 26.0–34.0)
MCHC: 34.3 g/dL (ref 30.0–36.0)
MCV: 78.9 fL — ABNORMAL LOW (ref 80.0–100.0)
Monocytes Absolute: 0.6 10*3/uL (ref 0.1–1.0)
Monocytes Relative: 10 %
Neutro Abs: 3.7 10*3/uL (ref 1.7–7.7)
Neutrophils Relative %: 65 %
Platelets: 144 10*3/uL — ABNORMAL LOW (ref 150–400)
RBC: 4.07 MIL/uL (ref 3.87–5.11)
RDW: 15.3 % (ref 11.5–15.5)
WBC: 5.6 10*3/uL (ref 4.0–10.5)
nRBC: 0 % (ref 0.0–0.2)

## 2021-12-20 LAB — IRON AND TIBC
Iron: 72 ug/dL (ref 28–170)
Saturation Ratios: 25 % (ref 10.4–31.8)
TIBC: 290 ug/dL (ref 250–450)
UIBC: 218 ug/dL

## 2021-12-20 LAB — COMPREHENSIVE METABOLIC PANEL
ALT: 24 U/L (ref 0–44)
AST: 25 U/L (ref 15–41)
Albumin: 4 g/dL (ref 3.5–5.0)
Alkaline Phosphatase: 72 U/L (ref 38–126)
Anion gap: 10 (ref 5–15)
BUN: 31 mg/dL — ABNORMAL HIGH (ref 8–23)
CO2: 26 mmol/L (ref 22–32)
Calcium: 9.5 mg/dL (ref 8.9–10.3)
Chloride: 102 mmol/L (ref 98–111)
Creatinine, Ser: 2.78 mg/dL — ABNORMAL HIGH (ref 0.44–1.00)
GFR, Estimated: 16 mL/min — ABNORMAL LOW (ref >60)
Glucose, Bld: 154 mg/dL — ABNORMAL HIGH (ref 70–99)
Potassium: 4.2 mmol/L (ref 3.5–5.1)
Sodium: 138 mmol/L (ref 135–145)
Total Bilirubin: 0.8 mg/dL (ref 0.3–1.2)
Total Protein: 6.7 g/dL (ref 6.5–8.1)

## 2021-12-20 LAB — FERRITIN: Ferritin: 106 ng/mL (ref 11–307)

## 2021-12-20 NOTE — Progress Notes (Signed)
Pt is doing well and no complaints. °

## 2021-12-22 ENCOUNTER — Encounter: Payer: Self-pay | Admitting: Oncology

## 2021-12-22 NOTE — Progress Notes (Signed)
Hematology/Oncology Consult note [facility]  Telephone:([phone] [phone]  Patient Care Team: Dion Body, MD as PCP - General (Family Medicine) Minna Merritts, MD as Consulting Physician (Cardiology) Anthonette Legato, MD as Consulting Physician (Nephrology) Asaro Lance, MD as Consulting Physician (Cardiology) Lorelee Cover., MD (Ophthalmology) Edrick Kins, DPM as Consulting Physician (Podiatry) Sindy Guadeloupe, MD as Consulting Physician (Hematology and Oncology)   Name of the patient: [name] [name]  [id number]  [birth year]   Date of visit: 12/22/21  Diagnosis- chronic anemia multifactorial secondary to anemia of chronic kidney disease and a component of iron deficiency  Chief complaint/ Reason for visit-routine follow-up of anemia of chronic kidney disease to receive Retacrit  Heme/Onc history:  Patient is a 87 year old female with history of anemia of chronic kidney disease for which she is on Retacrit.  She also has hemoglobin C trait.  She has received IV iron in the past as well.  Hemoglobin stable between 10-11 on Retacrit  Interval history-patient is doing well for her age and denies any specific complaints at this time.  ECOG PS- 1 Pain scale- 0   Review of systems- Review of Systems  Constitutional:  Negative for chills, fever, malaise/fatigue and weight loss.  HENT:  Negative for congestion, ear discharge and nosebleeds.   Eyes:  Negative for blurred vision.  Respiratory:  Negative for cough, hemoptysis, sputum production, shortness of breath and wheezing.   Cardiovascular:  Negative for chest pain, palpitations, orthopnea and claudication.  Gastrointestinal:  Negative for abdominal pain, blood in stool, constipation, diarrhea, heartburn, melena, nausea and vomiting.  Genitourinary:  Negative for dysuria, flank pain, frequency, hematuria and urgency.  Musculoskeletal:  Negative for back pain, joint pain and  myalgias.  Skin:  Negative for rash.  Neurological:  Negative for dizziness, tingling, focal weakness, seizures, weakness and headaches.  Endo/Heme/Allergies:  Does not bruise/bleed easily.  Psychiatric/Behavioral:  Negative for depression and suicidal ideas. The patient does not have insomnia.      Allergies  Allergen Reactions   Metoprolol Other (See Comments)    Bad dreams Patient stated that she had suicidal thoughts while taking this medication.    Diltiazem Other (See Comments)    Patient stated that she had bad dreams with this medication   Levofloxacin Nausea And Vomiting and Nausea Only          Past Medical History:  Diagnosis Date   1st degree AV block    a. PR 400 msec with Apacing   A-fib ([place])    Hx of   Anemia    Anxiety    Arthritis    knees and lower back    Chest pain    a. 2004 Cath:  reportedly nl;  b. 09/2013 Neg MV.   Chronic diastolic heart failure ([place])    a. 09/2013 Echo: EF 50-55%, mild LVH, mild to mod MR/TR.   Chronic kidney disease    Stage IV   Congestive heart disease (HCC)    Esophageal reflux    Gout    H/O hiatal hernia    History of UTI    Hyperlipidemia    Morbid obesity ([place])    OSA on CPAP    CPAP   Pacemaker    copy of medtronic card in chart   PAF (paroxysmal atrial fibrillation) (HCC)    Seasonal allergies    SSS (sick sinus syndrome) ([place])    a. 2008 s/p MDT PPM;  b. 11/2013  Gen change- MDT ADDRL1 Adapta DC PPM, ser # [id number].   Symptomatic PVCs    Syncope and collapse    a. Felt to be vasovagal.   Thyroid disease    nodules on thyroid, with goiter, on no meds per pt   Type II diabetes mellitus ([place])    Type 2   Unspecified essential hypertension    controlled on meds   Unspecified glaucoma(365.9)    Vertigo    in past   Wears dentures    upper full and lower partial     Past Surgical History:  Procedure Laterality Date   APPENDECTOMY     CARDIAC CATHETERIZATION     CARDIOVERSION N/A 04/26/2017    Procedure: Cardioversion;  Surgeon: Minna Merritts, MD;  Location: [facility];  Service: Cardiovascular;  Laterality: N/A;   CATARACT EXTRACTION W/PHACO Left 07/21/2020   Procedure: CATARACT EXTRACTION PHACO AND INTRAOCULAR LENS PLACEMENT ([place]) LEFT DIABETIC;  Surgeon: Leandrew Koyanagi, MD;  Location: [place];  Service: Ophthalmology;  Laterality: Left;  10.25 1:13.2 14.0%   CATARACT EXTRACTION W/PHACO Right 08/11/2020   Procedure: CATARACT EXTRACTION PHACO AND INTRAOCULAR LENS PLACEMENT ([place]) RIGHT DIABETIC;  Surgeon: Leandrew Koyanagi, MD;  Location: [place];  Service: Ophthalmology;  Laterality: Right;  11.63 1:25.0 13.7%   DILATION AND CURETTAGE OF UTERUS     INCISION AND DRAINAGE OF WOUND Right 03/1998   "leg; it was like a boil" (12/01/2013)   INSERT / REPLACE / REMOVE PACEMAKER  2008; 12/01/2013   MDT ADDRL1 pacemaker - gen change by Dr Caryl Comes 12/01/2013   LUNG BIOPSY  2010   [facility]   PERMANENT PACEMAKER GENERATOR CHANGE N/A 12/01/2013   Procedure: PERMANENT PACEMAKER GENERATOR CHANGE;  Surgeon: Deboraha Sprang, MD;  Location: [facility] CATH LAB;  Service: Cardiovascular;  Laterality: N/A;   VAGINAL HYSTERECTOMY  1970    Social History   Socioeconomic History   Marital status: Widowed    Spouse name: Not on file   Number of children: Y   Years of education: Not on file   Highest education level: Not on file  Occupational History   Occupation: retired  Tobacco Use   Smoking status: Former    Packs/day: 0.50    Years: 34.00    Pack years: 17.00    Types: Cigarettes    Quit date: 08/15/1989    Years since quitting: 32.3   Smokeless tobacco: Never  Vaping Use   Vaping Use: Never used  Substance and Sexual Activity   Alcohol use: No    Alcohol/week: 0.0 standard drinks   Drug use: No   Sexual activity: Never  Other Topics Concern   Not on file  Social History Narrative   Retired. Widowed. Regularly exercises.    Social Determinants of Health    Financial Resource Strain: Not on file  Food Insecurity: Not on file  Transportation Needs: Not on file  Physical Activity: Not on file  Stress: Not on file  Social Connections: Not on file  Intimate Partner Violence: Not on file    Family History  Problem Relation Age of Onset   Heart disease Father    Rheum arthritis Father    Allergies Father    Liver cancer Mother    Pancreatic cancer Mother    Hypertension Sister    Rheum arthritis Sister    Breast cancer Maternal Aunt      Current Outpatient Medications:    acetaminophen (TYLENOL) 500 MG tablet, Take 1,000 mg by  mouth every 6 (six) hours as needed for moderate pain. , Disp: , Rfl:    allopurinol (ZYLOPRIM) 100 MG tablet, Take 1 tablet by mouth daily., Disp: , Rfl:    amiodarone (PACERONE) 200 MG tablet, Take 100 mg by mouth 2 (two) times daily., Disp: , Rfl:    amLODipine (NORVASC) 5 MG tablet, Take 5 mg by mouth daily., Disp: , Rfl:    atorvastatin (LIPITOR) 20 MG tablet, TAKE 1 TABLET (20 MG TOTAL) BY MOUTH DAILY. (Patient taking differently: Take 20 mg by mouth at bedtime.), Disp: 90 tablet, Rfl: 0   azelastine (ASTELIN) 0.1 % nasal spray, Place into the nose., Disp: , Rfl:    Blood Glucose Monitoring Suppl (FIFTY50 GLUCOSE METER 2.0) w/Device KIT, Use as directed ONE TOUCH DX E11.22, Disp: , Rfl:    calcitRIOL (ROCALTROL) 0.25 MCG capsule, TAKE 1 CAPSULE (0.25 MCG TOTAL) BY MOUTH 1 (ONE) TIME EACH DAY, Disp: , Rfl:    carvedilol (COREG) 25 MG tablet, Take 25-50 mg by mouth See admin instructions. Take 1 tablet (25MG) by mouth every morning and 2 tablets (50MG) by mouth every night before bed, Disp: , Rfl:    diclofenac sodium (VOLTAREN) 1 % GEL, Apply 2 g topically 4 (four) times daily., Disp: , Rfl:    ferrous sulfate 325 (65 FE) MG EC tablet, Take 325 mg by mouth daily. , Disp: , Rfl:    furosemide (LASIX) 20 MG tablet, Take 1 tablet (20 mg total) by mouth daily., Disp: 90 tablet, Rfl: 0   glipiZIDE (GLUCOTROL) 10  MG tablet, TAKE 1 TABLET (10 MG TOTAL) BY MOUTH 2 (TWO) TIMES DAILY BEFORE A MEAL., Disp: 180 tablet, Rfl: 0   Glycerin, Laxative, (FLEET LIQUID GLYCERIN SUPP RE), Place 1 Dose rectally daily as needed (constipation)., Disp: , Rfl:    KLOR-CON M10 10 MEQ tablet, Take 1 tablet by mouth daily., Disp: , Rfl: 3   mometasone (ELOCON) 0.1 % cream, Apply topically daily. Apply to affected area as directed two times a day, Disp: 45 g, Rfl: 0   Multiple Vitamins-Minerals (CENTRUM SILVER PO), Take 1 tablet by mouth daily. , Disp: , Rfl:    olmesartan (BENICAR) 40 MG tablet, Take 1 tablet by mouth daily., Disp: , Rfl:    OneTouch Delica Lancets 81E MISC, 3 (three) times daily Use as instructed., Disp: , Rfl:    ONETOUCH VERIO test strip, 3 (three) times daily., Disp: , Rfl:    pantoprazole (PROTONIX) 40 MG tablet, Take 1 tablet by mouth daily., Disp: , Rfl:    polyethylene glycol (MIRALAX / GLYCOLAX) packet, Take 17 g by mouth daily as needed for moderate constipation., Disp: , Rfl:    sitaGLIPtin (JANUVIA) 25 MG tablet, Take by mouth., Disp: , Rfl:    warfarin (COUMADIN) 1 MG tablet, Take 1 mg by mouth daily., Disp: , Rfl:    amLODipine (NORVASC) 10 MG tablet, TAKE 1 TABLET (10 MG TOTAL) BY MOUTH DAILY. (Patient not taking: Reported on 08/30/2021), Disp: 90 tablet, Rfl: 0   esomeprazole (NEXIUM) 40 MG capsule, Take by mouth. (Patient not taking: Reported on 08/30/2021), Disp: , Rfl:    metFORMIN (GLUCOPHAGE-XR) 500 MG 24 hr tablet, Take by mouth. (Patient not taking: Reported on 08/30/2021), Disp: , Rfl:    simethicone (MYLICON) 40 XN/1.7GY drops, Take by mouth. (Patient not taking: Reported on 08/30/2021), Disp: , Rfl:    traZODone (DESYREL) 50 MG tablet, Take 50 mg by mouth at bedtime. (Patient not taking: Reported on 08/30/2021),  Disp: , Rfl:    warfarin (COUMADIN) 3 MG tablet, TAKE 1-1.5 TABLETS DAILY AT 6 PM. TAKE 1 TAB ON TUES, THURS, SAT, SUN. TAKE 4.5 MG ON MON, WED, FRI (Patient not taking: Reported on  12/20/2021), Disp: 100 tablet, Rfl: 3  Physical exam:  Vitals:   12/20/21 1051  BP: 126/73  Pulse: 73  Temp: 98.9 F (37.2 C)  TempSrc: Tympanic  Weight: 255 lb 11.2 oz (116 kg)   Physical Exam Constitutional:      General: She is not in acute distress. Cardiovascular:     Rate and Rhythm: Normal rate and regular rhythm.     Heart sounds: Normal heart sounds.  Pulmonary:     Effort: Pulmonary effort is normal.     Breath sounds: Normal breath sounds.  Abdominal:     General: Bowel sounds are normal.     Palpations: Abdomen is soft.  Skin:    General: Skin is warm and dry.  Neurological:     Mental Status: She is alert and oriented to person, place, and time.     CMP Latest Ref Rng & Units 12/20/2021  Glucose 70 - 99 mg/dL 154(H)  BUN 8 - 23 mg/dL 31(H)  Creatinine 0.44 - 1.00 mg/dL 2.78(H)  Sodium 135 - 145 mmol/L 138  Potassium 3.5 - 5.1 mmol/L 4.2  Chloride 98 - 111 mmol/L 102  CO2 22 - 32 mmol/L 26  Calcium 8.9 - 10.3 mg/dL 9.5  Total Protein 6.5 - 8.1 g/dL 6.7  Total Bilirubin 0.3 - 1.2 mg/dL 0.8  Alkaline Phos 38 - 126 U/L 72  AST 15 - 41 U/L 25  ALT 0 - 44 U/L 24   CBC Latest Ref Rng & Units 12/20/2021  WBC 4.0 - 10.5 K/uL 5.6  Hemoglobin 12.0 - 15.0 g/dL 11.0(L)  Hematocrit 36.0 - 46.0 % 32.1(L)  Platelets 150 - 400 K/uL 144(L)    Assessment and plan- Patient is a 87 y.o. female with anemia of chronic kidney disease on Retacrit here for routine follow-up  Patient is currently doing well and has not received any Retacrit since October 2022.  HerHemoglobin presently is 11 and therefore she does not require any Retacrit at this time.  Although she has evidence of microcytosis her ferritin is 106 with an iron saturation of 25% and therefore she does not require any IV iron at this time.  Given that her hemoglobin is remained close to 11 for the most part we will repeat her CBC ferritin and iron studies in 3 in 6 months and I will see her back in 6 months.  If she  drops her hemoglobin again we will consider reinitiating Retacrit at that time   Visit Diagnosis 1. Anemia due to chronic renal failure treated with erythropoietin, stage 4 (severe) ([place])   2. Erythropoietin (EPO) stimulating agent anemia management patient   3. Iron deficiency anemia, unspecified iron deficiency anemia type   4. Anemia of chronic kidney failure, stage 3 (moderate) (HCC)   5. Anemia of chronic renal failure, stage 3a (HCC)      Dr. Randa Evens, MD, MPH [facility] [phone] 12/22/2021 12:23 PM

## 2022-01-12 DIAGNOSIS — E119 Type 2 diabetes mellitus without complications: Secondary | ICD-10-CM | POA: Diagnosis not present

## 2022-01-12 DIAGNOSIS — B351 Tinea unguium: Secondary | ICD-10-CM | POA: Diagnosis not present

## 2022-01-12 DIAGNOSIS — L851 Acquired keratosis [keratoderma] palmaris et plantaris: Secondary | ICD-10-CM | POA: Diagnosis not present

## 2022-01-20 DIAGNOSIS — Z7901 Long term (current) use of anticoagulants: Secondary | ICD-10-CM | POA: Diagnosis not present

## 2022-01-20 DIAGNOSIS — I480 Paroxysmal atrial fibrillation: Secondary | ICD-10-CM | POA: Diagnosis not present

## 2022-01-25 DIAGNOSIS — I495 Sick sinus syndrome: Secondary | ICD-10-CM | POA: Diagnosis not present

## 2022-01-26 DIAGNOSIS — J028 Acute pharyngitis due to other specified organisms: Secondary | ICD-10-CM | POA: Diagnosis not present

## 2022-01-26 DIAGNOSIS — B9789 Other viral agents as the cause of diseases classified elsewhere: Secondary | ICD-10-CM | POA: Diagnosis not present

## 2022-01-26 DIAGNOSIS — J069 Acute upper respiratory infection, unspecified: Secondary | ICD-10-CM | POA: Diagnosis not present

## 2022-01-26 DIAGNOSIS — R0981 Nasal congestion: Secondary | ICD-10-CM | POA: Diagnosis not present

## 2022-01-30 DIAGNOSIS — N2581 Secondary hyperparathyroidism of renal origin: Secondary | ICD-10-CM | POA: Diagnosis not present

## 2022-01-30 DIAGNOSIS — I10 Essential (primary) hypertension: Secondary | ICD-10-CM | POA: Diagnosis not present

## 2022-01-30 DIAGNOSIS — N184 Chronic kidney disease, stage 4 (severe): Secondary | ICD-10-CM | POA: Diagnosis not present

## 2022-01-30 DIAGNOSIS — E1122 Type 2 diabetes mellitus with diabetic chronic kidney disease: Secondary | ICD-10-CM | POA: Diagnosis not present

## 2022-01-30 DIAGNOSIS — D631 Anemia in chronic kidney disease: Secondary | ICD-10-CM | POA: Diagnosis not present

## 2022-02-14 DIAGNOSIS — G4733 Obstructive sleep apnea (adult) (pediatric): Secondary | ICD-10-CM | POA: Diagnosis not present

## 2022-02-17 DIAGNOSIS — I480 Paroxysmal atrial fibrillation: Secondary | ICD-10-CM | POA: Diagnosis not present

## 2022-02-20 DIAGNOSIS — I495 Sick sinus syndrome: Secondary | ICD-10-CM | POA: Diagnosis not present

## 2022-02-20 DIAGNOSIS — I480 Paroxysmal atrial fibrillation: Secondary | ICD-10-CM | POA: Diagnosis not present

## 2022-02-20 DIAGNOSIS — E7800 Pure hypercholesterolemia, unspecified: Secondary | ICD-10-CM | POA: Diagnosis not present

## 2022-02-20 DIAGNOSIS — I10 Essential (primary) hypertension: Secondary | ICD-10-CM | POA: Diagnosis not present

## 2022-02-20 DIAGNOSIS — I5032 Chronic diastolic (congestive) heart failure: Secondary | ICD-10-CM | POA: Diagnosis not present

## 2022-02-20 DIAGNOSIS — R600 Localized edema: Secondary | ICD-10-CM | POA: Diagnosis not present

## 2022-03-08 DIAGNOSIS — I5032 Chronic diastolic (congestive) heart failure: Secondary | ICD-10-CM | POA: Diagnosis not present

## 2022-03-08 DIAGNOSIS — N184 Chronic kidney disease, stage 4 (severe): Secondary | ICD-10-CM | POA: Diagnosis not present

## 2022-03-17 DIAGNOSIS — I480 Paroxysmal atrial fibrillation: Secondary | ICD-10-CM | POA: Diagnosis not present

## 2022-03-17 DIAGNOSIS — E139 Other specified diabetes mellitus without complications: Secondary | ICD-10-CM | POA: Diagnosis not present

## 2022-03-17 DIAGNOSIS — I4891 Unspecified atrial fibrillation: Secondary | ICD-10-CM | POA: Diagnosis not present

## 2022-03-17 DIAGNOSIS — G4733 Obstructive sleep apnea (adult) (pediatric): Secondary | ICD-10-CM | POA: Diagnosis not present

## 2022-03-20 ENCOUNTER — Inpatient Hospital Stay: Payer: HMO | Attending: Oncology

## 2022-03-20 ENCOUNTER — Inpatient Hospital Stay: Payer: HMO

## 2022-03-20 DIAGNOSIS — D631 Anemia in chronic kidney disease: Secondary | ICD-10-CM | POA: Diagnosis not present

## 2022-03-20 DIAGNOSIS — N184 Chronic kidney disease, stage 4 (severe): Secondary | ICD-10-CM | POA: Insufficient documentation

## 2022-03-20 DIAGNOSIS — D509 Iron deficiency anemia, unspecified: Secondary | ICD-10-CM

## 2022-03-20 DIAGNOSIS — Z79899 Other long term (current) drug therapy: Secondary | ICD-10-CM

## 2022-03-20 DIAGNOSIS — D649 Anemia, unspecified: Secondary | ICD-10-CM

## 2022-03-20 DIAGNOSIS — N183 Chronic kidney disease, stage 3 unspecified: Secondary | ICD-10-CM

## 2022-03-20 LAB — CBC WITH DIFFERENTIAL/PLATELET
Abs Immature Granulocytes: 0.02 10*3/uL (ref 0.00–0.07)
Basophils Absolute: 0 10*3/uL (ref 0.0–0.1)
Basophils Relative: 0 %
Eosinophils Absolute: 0.2 10*3/uL (ref 0.0–0.5)
Eosinophils Relative: 2 %
HCT: 32.2 % — ABNORMAL LOW (ref 36.0–46.0)
Hemoglobin: 10.9 g/dL — ABNORMAL LOW (ref 12.0–15.0)
Immature Granulocytes: 0 %
Lymphocytes Relative: 21 %
Lymphs Abs: 1.3 10*3/uL (ref 0.7–4.0)
MCH: 27 pg (ref 26.0–34.0)
MCHC: 33.9 g/dL (ref 30.0–36.0)
MCV: 79.7 fL — ABNORMAL LOW (ref 80.0–100.0)
Monocytes Absolute: 0.6 10*3/uL (ref 0.1–1.0)
Monocytes Relative: 9 %
Neutro Abs: 4.2 10*3/uL (ref 1.7–7.7)
Neutrophils Relative %: 68 %
Platelets: 152 10*3/uL (ref 150–400)
RBC: 4.04 MIL/uL (ref 3.87–5.11)
RDW: 14.5 % (ref 11.5–15.5)
WBC: 6.2 10*3/uL (ref 4.0–10.5)
nRBC: 0 % (ref 0.0–0.2)

## 2022-03-20 LAB — IRON AND TIBC
Iron: 62 ug/dL (ref 28–170)
Saturation Ratios: 23 % (ref 10.4–31.8)
TIBC: 269 ug/dL (ref 250–450)
UIBC: 207 ug/dL

## 2022-03-20 LAB — FERRITIN: Ferritin: 121 ng/mL (ref 11–307)

## 2022-03-22 ENCOUNTER — Ambulatory Visit (INDEPENDENT_AMBULATORY_CARE_PROVIDER_SITE_OTHER): Payer: HMO | Admitting: Cardiovascular Disease

## 2022-03-22 ENCOUNTER — Encounter: Payer: Self-pay | Admitting: Cardiovascular Disease

## 2022-03-22 VITALS — BP 128/58 | HR 64 | Ht 64.0 in | Wt 256.0 lb

## 2022-03-22 DIAGNOSIS — I480 Paroxysmal atrial fibrillation: Secondary | ICD-10-CM

## 2022-03-22 DIAGNOSIS — I5032 Chronic diastolic (congestive) heart failure: Secondary | ICD-10-CM

## 2022-03-22 DIAGNOSIS — I10 Essential (primary) hypertension: Secondary | ICD-10-CM | POA: Diagnosis not present

## 2022-03-22 DIAGNOSIS — Z950 Presence of cardiac pacemaker: Secondary | ICD-10-CM

## 2022-03-22 DIAGNOSIS — I495 Sick sinus syndrome: Secondary | ICD-10-CM | POA: Diagnosis not present

## 2022-03-22 NOTE — Patient Instructions (Addendum)
You have been referred to EP for a pacer check ? ?Medication Instructions:  ?No changes ? ?If you need a refill on your cardiac medications before your next appointment, please call your pharmacy.  ? ?Lab work: ?No new labs needed ? ?Testing/Procedures: ?No new testing needed ? ?Follow-Up: ?At [facility], you and your health needs are our priority.  As part of our continuing mission to provide you with exceptional heart care, we have created designated Provider Care Teams.  These Care Teams include your primary Cardiologist (physician) and Advanced Practice Providers (APPs -  Physician Assistants and Nurse Practitioners) who all work together to provide you with the care you need, when you need it. ? ?You will need a follow up appointment in 6 months ? ?Providers on your designated Care Team:   ?Murray Hodgkins, NP ?Christell Faith, PA-C ?Cadence Kathlen Mody, PA-C ? ?COVID-19 Vaccine Information can be found at: [email or web address] For questions related to vaccine distribution or appointments, please email [email or web address] or call [phone].  ? ?

## 2022-03-22 NOTE — Progress Notes (Signed)
Cardiology Office Note ? ?Date:  03/22/2022  ? ?ID:  [name] [name], DOB 1935/11/17, MRN [id number] ? ?PCP:  Dion Body, MD  ? ?Chief Complaint  ?Patient presents with  ? New Patient (Initial Visit)  ?  Establish care for A-Fib; former patient of Dr. Ubaldo Glassing. Patient c/o LE edema, shortness of breath and chest pressure at times. Medications reviewed by the patient verbally.   ? ? ?HPI:  ?[name] [name] is a 87 yo woman with a history of  ?Ejection fraction 50-55%  ?Paroxysmal atrial fibrillation, on warfarin ?Morbid obesity,  ?symptomatic palpitations/PVCs,  ?pacemaker implantation for sick sinus syndrome/tachybradycardia syndrome with chronic symptoms of palpitations,  ?shortness of breath with exertion,  ?chronic fatigue ?Obstructive sleep apnea on CPAP ?Chronic renal failure ?who presents for new patient evaluation for atrial fibrillation and shortness of breath ?Sees Dr. Ouida Sills at [place] ? ?Last seen by myself in clinic January 2019 ?Changed to [facility] for several years, now wishes to change back to [facility] ?Has had her pacer downloads evaluated through the [place] system ? ?Recent imaging done in the [facility] system reviewed ?Echocardiogram May 2022 ?NORMAL LEFT VENTRICULAR SYSTOLIC FUNCTION  ?NORMAL RIGHT VENTRICULAR SYSTOLIC FUNCTION  ?NO VALVULAR STENOSIS  ?MILD to MODERATE MR  ?MODERATE TR  ?MILD PR  ?EF 55-60%  ?Closest EF: >55% (Estimated)  ?Size: MILDLY ENLARGED  ?Mitral: MILD MR  ?Tricuspid: MODERATE TR  ?RV Masses: CATHETER IN RV  ? ?Stress test October 2020, Myoview ?No significant ischemia, normal ejection fraction ? ?In general reports that she feels well ?Exercising 3x a week at the [facility] ?Rare chest pain, mild on left at rest, takes deep breaths and symptoms go away ?Little tired when walking ?No regular walking, limited by chronic left knee pain, wears a brace, uses a cane ? ?Has underlying renal failure creatinine 2.4 followed by nephrology ?Does not want dialysis if it ever comes to that ?Drinks >64 oz  a day water, chronic lower extremity edema ?" I drink a little bit extra just to be safe" ? ?History of sleep disorder, on CPAP ? ?Lab work reviewed ?Creatinine 2.56 BUN 30 ?Hemoglobin 11 ?Total cholesterol 169 LDL 90 ?A1c 6.4, had previously run in the 8 range ? ?EKG personally reviewed by myself on todays visit ?Paced rhythm rate 64 bpm ? ? ?PMH:   has a past medical history of 1st degree AV block, A-fib (HCC), Anemia, Anxiety, Arthritis, Chest pain, Chronic diastolic heart failure (HCC), Chronic kidney disease, Congestive heart disease ([place]), Esophageal reflux, Gout, H/O hiatal hernia, History of UTI, Hyperlipidemia, Morbid obesity ([place]), OSA on CPAP, Pacemaker, PAF (paroxysmal atrial fibrillation) ([place]), Seasonal allergies, SSS (sick sinus syndrome) ([place]), Symptomatic PVCs, Syncope and collapse, Thyroid disease, Type II diabetes mellitus ([place]), Unspecified essential hypertension, Unspecified glaucoma(365.9), Vertigo, and Wears dentures. ? ?PSH:    ?Past Surgical History:  ?Procedure Laterality Date  ? APPENDECTOMY    ? CARDIAC CATHETERIZATION    ? CARDIOVERSION N/A 04/26/2017  ? Procedure: Cardioversion;  Surgeon: Minna Merritts, MD;  Location: [facility];  Service: Cardiovascular;  Laterality: N/A;  ? CATARACT EXTRACTION W/PHACO Left 07/21/2020  ? Procedure: CATARACT EXTRACTION PHACO AND INTRAOCULAR LENS PLACEMENT ([place]) LEFT DIABETIC;  Surgeon: Leandrew Koyanagi, MD;  Location: [place];  Service: Ophthalmology;  Laterality: Left;  10.25 ?1:13.2 ?14.0%  ? CATARACT EXTRACTION W/PHACO Right 08/11/2020  ? Procedure: CATARACT EXTRACTION PHACO AND INTRAOCULAR LENS PLACEMENT ([place]) RIGHT DIABETIC;  Surgeon: Leandrew Koyanagi, MD;  Location: [place];  Service: Ophthalmology;  Laterality:  Right;  11.63 ?1:25.0 ?13.7%  ? DILATION AND CURETTAGE OF UTERUS    ? INCISION AND DRAINAGE OF WOUND Right 03/1998  ? "leg; it was like a boil" (12/01/2013)  ? INSERT / REPLACE / REMOVE PACEMAKER  2008;  12/01/2013  ? MDT ADDRL1 pacemaker - gen change by Dr Caryl Comes 12/01/2013  ? LUNG BIOPSY  2010  ? [facility]  ? PERMANENT PACEMAKER GENERATOR CHANGE N/A 12/01/2013  ? Procedure: PERMANENT PACEMAKER GENERATOR CHANGE;  Surgeon: Deboraha Sprang, MD;  Location: [facility] CATH LAB;  Service: Cardiovascular;  Laterality: N/A;  ? VAGINAL HYSTERECTOMY  1970  ? ? ?Current Outpatient Medications  ?Medication Sig Dispense Refill  ? acetaminophen (TYLENOL) 500 MG tablet Take 1,000 mg by mouth every 6 (six) hours as needed for moderate pain.     ? allopurinol (ZYLOPRIM) 100 MG tablet Take 1 tablet by mouth daily.    ? amiodarone (PACERONE) 200 MG tablet Take 200 mg by mouth daily.    ? amLODipine (NORVASC) 5 MG tablet Take 5 mg by mouth daily.    ? atorvastatin (LIPITOR) 20 MG tablet TAKE 1 TABLET (20 MG TOTAL) BY MOUTH DAILY. (Patient taking differently: Take 20 mg by mouth at bedtime.) 90 tablet 0  ? calcitRIOL (ROCALTROL) 0.25 MCG capsule TAKE 1 CAPSULE (0.25 MCG TOTAL) BY MOUTH 1 (ONE) TIME EACH DAY    ? carvedilol (COREG) 25 MG tablet Take 25-50 mg by mouth See admin instructions. Take 1 tablet (25MG) by mouth every morning and 2 tablets (50MG) by mouth every night before bed    ? ferrous sulfate 325 (65 FE) MG EC tablet Take 325 mg by mouth daily.     ? furosemide (LASIX) 20 MG tablet Take 40 mg by mouth daily.    ? Glycerin, Laxative, (FLEET LIQUID GLYCERIN SUPP RE) Place 1 Dose rectally daily as needed (constipation).    ? KLOR-CON M10 10 MEQ tablet Take 1 tablet by mouth daily.  3  ? meclizine (ANTIVERT) 25 MG tablet Take 1 tablet by mouth 3 (three) times daily as needed.    ? mometasone (ELOCON) 0.1 % cream Apply topically daily. Apply to affected area as directed two times a day 45 g 0  ? Multiple Vitamins-Minerals (CENTRUM SILVER PO) Take 1 tablet by mouth daily.     ? olmesartan (BENICAR) 40 MG tablet Take 1 tablet by mouth daily.    ? pantoprazole (PROTONIX) 40 MG tablet Take 1 tablet by mouth daily.    ? polyethylene glycol  (MIRALAX / GLYCOLAX) packet Take 17 g by mouth daily as needed for moderate constipation.    ? sitaGLIPtin (JANUVIA) 25 MG tablet Take by mouth.    ? warfarin (COUMADIN) 1 MG tablet Take 1 mg by mouth as directed.    ? azelastine (ASTELIN) 0.1 % nasal spray Place into the nose. (Patient not taking: Reported on 03/22/2022)    ? Blood Glucose Monitoring Suppl (FIFTY50 GLUCOSE METER 2.0) w/Device KIT Use as directed ONE TOUCH DX E11.22 (Patient not taking: Reported on 03/22/2022)    ? diclofenac sodium (VOLTAREN) 1 % GEL Apply 2 g topically 4 (four) times daily. (Patient not taking: Reported on 03/22/2022)    ? esomeprazole (NEXIUM) 40 MG capsule Take by mouth. (Patient not taking: Reported on 08/30/2021)    ? glipiZIDE (GLUCOTROL) 10 MG tablet TAKE 1 TABLET (10 MG TOTAL) BY MOUTH 2 (TWO) TIMES DAILY BEFORE A MEAL. (Patient not taking: Reported on 03/22/2022) 180 tablet 0  ? metFORMIN (GLUCOPHAGE-XR)  500 MG 24 hr tablet Take by mouth. (Patient not taking: Reported on 08/30/2021)    ? OneTouch Delica Lancets 81O MISC 3 (three) times daily Use as instructed. (Patient not taking: Reported on 03/22/2022)    ? ONETOUCH VERIO test strip 3 (three) times daily. (Patient not taking: Reported on 03/22/2022)    ? simethicone (MYLICON) 40 FB/5.1WC drops Take by mouth. (Patient not taking: Reported on 08/30/2021)    ? traZODone (DESYREL) 50 MG tablet Take 50 mg by mouth at bedtime. (Patient not taking: Reported on 08/30/2021)    ? warfarin (COUMADIN) 3 MG tablet TAKE 1-1.5 TABLETS DAILY AT 6 PM. TAKE 1 TAB ON TUES, THURS, SAT, SUN. TAKE 4.5 MG ON MON, WED, FRI (Patient not taking: Reported on 12/20/2021) 100 tablet 3  ? ?No current facility-administered medications for this visit.  ? ? ?Allergies:   Metoprolol, Diltiazem, and Levofloxacin  ? ?Social History:  The patient  reports that she quit smoking about 32 years ago. Her smoking use included cigarettes. She has a 17.00 pack-year smoking history. She has never used smokeless tobacco. She  reports that she does not drink alcohol and does not use drugs.  ? ?Family History:   family history includes Allergies in her father; Breast cancer in her maternal aunt; Heart disease in her father; Hypertension in

## 2022-03-23 ENCOUNTER — Telehealth: Payer: Self-pay | Admitting: Emergency Medicine

## 2022-03-23 NOTE — Telephone Encounter (Signed)
Incoming secure chat from Dr. Rockey Situ:  ? ?Could we find out who is going to cover her Coumadin checks?  Does she do that at [facility] or does she need referral to anticoagulation clinic here? ? ?Patient reports that Dr. Frazier Richards handles her coumadin as well as her diabetes, and she will continue to let him manage her coumadin.  ? ?Pt voiced appreciation for the call.  ?

## 2022-03-27 DIAGNOSIS — N184 Chronic kidney disease, stage 4 (severe): Secondary | ICD-10-CM | POA: Diagnosis not present

## 2022-03-27 DIAGNOSIS — E1122 Type 2 diabetes mellitus with diabetic chronic kidney disease: Secondary | ICD-10-CM | POA: Diagnosis not present

## 2022-04-04 DIAGNOSIS — N184 Chronic kidney disease, stage 4 (severe): Secondary | ICD-10-CM | POA: Diagnosis not present

## 2022-04-04 DIAGNOSIS — H43823 Vitreomacular adhesion, bilateral: Secondary | ICD-10-CM | POA: Diagnosis not present

## 2022-04-10 DIAGNOSIS — E1122 Type 2 diabetes mellitus with diabetic chronic kidney disease: Secondary | ICD-10-CM | POA: Diagnosis not present

## 2022-04-10 DIAGNOSIS — I10 Essential (primary) hypertension: Secondary | ICD-10-CM | POA: Diagnosis not present

## 2022-04-10 DIAGNOSIS — R809 Proteinuria, unspecified: Secondary | ICD-10-CM | POA: Diagnosis not present

## 2022-04-10 DIAGNOSIS — N2581 Secondary hyperparathyroidism of renal origin: Secondary | ICD-10-CM | POA: Diagnosis not present

## 2022-04-10 DIAGNOSIS — D631 Anemia in chronic kidney disease: Secondary | ICD-10-CM | POA: Diagnosis not present

## 2022-04-10 DIAGNOSIS — N184 Chronic kidney disease, stage 4 (severe): Secondary | ICD-10-CM | POA: Diagnosis not present

## 2022-04-11 DIAGNOSIS — G8929 Other chronic pain: Secondary | ICD-10-CM | POA: Diagnosis not present

## 2022-04-11 DIAGNOSIS — L851 Acquired keratosis [keratoderma] palmaris et plantaris: Secondary | ICD-10-CM | POA: Diagnosis not present

## 2022-04-11 DIAGNOSIS — B351 Tinea unguium: Secondary | ICD-10-CM | POA: Diagnosis not present

## 2022-04-11 DIAGNOSIS — M25562 Pain in left knee: Secondary | ICD-10-CM | POA: Diagnosis not present

## 2022-04-11 DIAGNOSIS — E119 Type 2 diabetes mellitus without complications: Secondary | ICD-10-CM | POA: Diagnosis not present

## 2022-04-16 DIAGNOSIS — G4733 Obstructive sleep apnea (adult) (pediatric): Secondary | ICD-10-CM | POA: Diagnosis not present

## 2022-04-16 DIAGNOSIS — E139 Other specified diabetes mellitus without complications: Secondary | ICD-10-CM | POA: Diagnosis not present

## 2022-04-17 DIAGNOSIS — I480 Paroxysmal atrial fibrillation: Secondary | ICD-10-CM | POA: Diagnosis not present

## 2022-05-11 DIAGNOSIS — M1712 Unilateral primary osteoarthritis, left knee: Secondary | ICD-10-CM | POA: Diagnosis not present

## 2022-05-16 DIAGNOSIS — H353221 Exudative age-related macular degeneration, left eye, with active choroidal neovascularization: Secondary | ICD-10-CM | POA: Diagnosis not present

## 2022-05-17 ENCOUNTER — Ambulatory Visit (INDEPENDENT_AMBULATORY_CARE_PROVIDER_SITE_OTHER): Payer: HMO | Admitting: Cardiology

## 2022-05-17 ENCOUNTER — Encounter: Payer: Self-pay | Admitting: Cardiology

## 2022-05-17 VITALS — BP 136/78 | HR 76 | Ht 64.0 in | Wt 251.4 lb

## 2022-05-17 DIAGNOSIS — Z950 Presence of cardiac pacemaker: Secondary | ICD-10-CM

## 2022-05-17 DIAGNOSIS — Z79899 Other long term (current) drug therapy: Secondary | ICD-10-CM

## 2022-05-17 DIAGNOSIS — I495 Sick sinus syndrome: Secondary | ICD-10-CM

## 2022-05-17 DIAGNOSIS — G4733 Obstructive sleep apnea (adult) (pediatric): Secondary | ICD-10-CM | POA: Diagnosis not present

## 2022-05-17 DIAGNOSIS — E139 Other specified diabetes mellitus without complications: Secondary | ICD-10-CM | POA: Diagnosis not present

## 2022-05-17 LAB — PACEMAKER DEVICE OBSERVATION

## 2022-05-17 NOTE — Patient Instructions (Signed)
Medications: Your physician recommends that you continue on your current medications as directed. Please refer to the Current Medication list given to you today. *If you need a refill on your cardiac medications before your next appointment, please call your pharmacy*  Lab Work: None. If you have labs (blood work) drawn today and your tests are completely normal, you will receive your results only by: MyChart Message (if you have MyChart) OR A paper copy in the mail If you have any lab test that is abnormal or we need to change your treatment, we will call you to review the results.  Testing/Procedures: None.  Follow-Up: At [facility], you and your health needs are our priority.  As part of our continuing mission to provide you with exceptional heart care, we have created designated Provider Care Teams.  These Care Teams include your primary Cardiologist (physician) and Advanced Practice Providers (APPs -  Physician Assistants and Nurse Practitioners) who all work together to provide you with the care you need, when you need it.  Your physician wants you to follow-up in: 12 months with Cameron Lambert, MD     You will receive a reminder letter in the mail two months in advance. If you don't receive a letter, please call our office to schedule the follow-up appointment.  We recommend signing up for the patient portal called "MyChart".  Sign up information is provided on this After Visit Summary.  MyChart is used to connect with patients for Virtual Visits (Telemedicine).  Patients are able to view lab/test results, encounter notes, upcoming appointments, etc.  Non-urgent messages can be sent to your provider as well.   To learn more about what you can do with MyChart, go to [email or web address].    Any Other Special Instructions Will Be Listed Below (If Applicable).  

## 2022-05-17 NOTE — Progress Notes (Signed)
Electrophysiology Office Note:    Date:  05/17/2022   ID:  [name] [name], DOB 20-Jan-1935, MRN [id number]  PCP:  Dion Body, MD  [facility] Cardiologist:  None  [facility] Electrophysiologist:  Vickie Epley, MD   Referring MD: Minna Merritts, MD   Chief Complaint: Sick sinus syndrome and pacemaker  History of Present Illness:    [name] [name] is a 87 y.o. female who presents for an evaluation of sick sinus syndrome and pacemaker at the request of Dr. Rockey Situ. Their medical history includes tachycardia bradycardia syndrome, CKD 4, chronic diastolic heart failure and atrial fibrillation on warfarin for stroke prophylaxis.  The patient was last seen by Dr. Rockey Situ March 22, 2022.  The patient has previously received care at [facility].  At the appointment with Dr. Rockey Situ she reported exercise 3 times a week.  At that visit the patient was in a paced rhythm at 64 bpm.  She presented to his clinic already prescribed amiodarone 200 mg by mouth daily along with carvedilol.  She presents today to to establish care for her pacemaker.    Past Medical History:  Diagnosis Date   1st degree AV block    a. PR 400 msec with Apacing   A-fib ([place])    Hx of   Anemia    Anxiety    Arthritis    knees and lower back    Chest pain    a. 2004 Cath:  reportedly nl;  b. 09/2013 Neg MV.   Chronic diastolic heart failure ([place])    a. 09/2013 Echo: EF 50-55%, mild LVH, mild to mod MR/TR.   Chronic kidney disease    Stage IV   Congestive heart disease (HCC)    Esophageal reflux    Gout    H/O hiatal hernia    History of UTI    Hyperlipidemia    Morbid obesity ([place])    OSA on CPAP    CPAP   Pacemaker    copy of medtronic card in chart   PAF (paroxysmal atrial fibrillation) (HCC)    Seasonal allergies    SSS (sick sinus syndrome) ([place])    a. 2008 s/p MDT PPM;  b. 11/2013 Gen change- MDT ADDRL1 Adapta DC PPM, ser # [id number].   Symptomatic PVCs    Syncope and collapse     a. Felt to be vasovagal.   Thyroid disease    nodules on thyroid, with goiter, on no meds per pt   Type II diabetes mellitus ([place])    Type 2   Unspecified essential hypertension    controlled on meds   Unspecified glaucoma(365.9)    Vertigo    in past   Wears dentures    upper full and lower partial    Past Surgical History:  Procedure Laterality Date   APPENDECTOMY     CARDIAC CATHETERIZATION     CARDIOVERSION N/A 04/26/2017   Procedure: Cardioversion;  Surgeon: Minna Merritts, MD;  Location: [facility] ORS;  Service: Cardiovascular;  Laterality: N/A;   CATARACT EXTRACTION W/PHACO Left 07/21/2020   Procedure: CATARACT EXTRACTION PHACO AND INTRAOCULAR LENS PLACEMENT ([place]) LEFT DIABETIC;  Surgeon: Leandrew Koyanagi, MD;  Location: [place];  Service: Ophthalmology;  Laterality: Left;  10.25 1:13.2 14.0%   CATARACT EXTRACTION W/PHACO Right 08/11/2020   Procedure: CATARACT EXTRACTION PHACO AND INTRAOCULAR LENS PLACEMENT ([place]) RIGHT DIABETIC;  Surgeon: Leandrew Koyanagi, MD;  Location: [place];  Service: Ophthalmology;  Laterality: Right;  11.63 1:25.0  13.7%   DILATION AND CURETTAGE OF UTERUS     INCISION AND DRAINAGE OF WOUND Right 03/1998   "leg; it was like a boil" (12/01/2013)   INSERT / REPLACE / REMOVE PACEMAKER  2008; 12/01/2013   MDT ADDRL1 pacemaker - gen change by Dr Caryl Comes 12/01/2013   LUNG BIOPSY  2010   unc   PERMANENT PACEMAKER GENERATOR CHANGE N/A 12/01/2013   Procedure: PERMANENT PACEMAKER GENERATOR CHANGE;  Surgeon: Deboraha Sprang, MD;  Location: [facility] CATH LAB;  Service: Cardiovascular;  Laterality: N/A;   VAGINAL HYSTERECTOMY  1970    Current Medications: Current Meds  Medication Sig   acetaminophen (TYLENOL) 500 MG tablet Take 1,000 mg by mouth every 6 (six) hours as needed for moderate pain.    allopurinol (ZYLOPRIM) 100 MG tablet Take 1 tablet by mouth daily.   amiodarone (PACERONE) 200 MG tablet Take 200 mg by mouth daily.   amLODipine  (NORVASC) 5 MG tablet Take 5 mg by mouth daily.   atorvastatin (LIPITOR) 20 MG tablet TAKE 1 TABLET (20 MG TOTAL) BY MOUTH DAILY.   calcitRIOL (ROCALTROL) 0.25 MCG capsule TAKE 1 CAPSULE (0.25 MCG TOTAL) BY MOUTH 1 (ONE) TIME EACH DAY   carvedilol (COREG) 25 MG tablet Take 25-50 mg by mouth See admin instructions. Take 1 tablet ('25MG'$ ) by mouth every morning and 2 tablets ('50MG'$ ) by mouth every night before bed   ferrous sulfate 325 (65 FE) MG EC tablet Take 325 mg by mouth daily.    furosemide (LASIX) 20 MG tablet Take 40 mg by mouth daily.   glipiZIDE (GLUCOTROL) 10 MG tablet TAKE 1 TABLET (10 MG TOTAL) BY MOUTH 2 (TWO) TIMES DAILY BEFORE A MEAL.   Glycerin, Laxative, (FLEET LIQUID GLYCERIN SUPP RE) Place 1 Dose rectally daily as needed (constipation).   KLOR-CON M10 10 MEQ tablet Take 1 tablet by mouth daily.   meclizine (ANTIVERT) 25 MG tablet Take 1 tablet by mouth 3 (three) times daily as needed.   mometasone (ELOCON) 0.1 % cream Apply topically daily. Apply to affected area as directed two times a day   Multiple Vitamins-Minerals (CENTRUM SILVER PO) Take 1 tablet by mouth daily.    olmesartan (BENICAR) 40 MG tablet Take 1 tablet by mouth daily.   OneTouch Delica Lancets 62H MISC    ONETOUCH VERIO test strip 3 (three) times daily.   pantoprazole (PROTONIX) 40 MG tablet Take 1 tablet by mouth daily.   polyethylene glycol (MIRALAX / GLYCOLAX) packet Take 17 g by mouth daily as needed for moderate constipation.   simethicone (MYLICON) 40 UT/6.5YY drops Take by mouth.   sitaGLIPtin (JANUVIA) 25 MG tablet Take by mouth.   traZODone (DESYREL) 50 MG tablet Take 50 mg by mouth at bedtime.   warfarin (COUMADIN) 1 MG tablet Take 1 mg by mouth as directed.     Allergies:   Metoprolol, Diltiazem, and Levofloxacin   Social History   Socioeconomic History   Marital status: Widowed    Spouse name: Not on file   Number of children: Y   Years of education: Not on file   Highest education level:  Not on file  Occupational History   Occupation: retired  Tobacco Use   Smoking status: Former    Packs/day: 0.50    Years: 34.00    Total pack years: 17.00    Types: Cigarettes    Quit date: 08/15/1989    Years since quitting: 32.7   Smokeless tobacco: Never  Vaping Use   Vaping  Use: Never used  Substance and Sexual Activity   Alcohol use: No    Alcohol/week: 0.0 standard drinks of alcohol   Drug use: No   Sexual activity: Never  Other Topics Concern   Not on file  Social History Narrative   Retired. Widowed. Regularly exercises.    Social Determinants of Health   Financial Resource Strain: Unknown (12/01/2018)   Overall Financial Resource Strain (CARDIA)    Difficulty of Paying Living Expenses: Patient refused  Food Insecurity: Unknown (12/01/2018)   Hunger Vital Sign    Worried About Running Out of Food in the Last Year: Patient refused    [place] in the Last Year: Patient refused  Transportation Needs: Unknown (12/01/2018)   PRAPARE - Hydrologist (Medical): Patient refused    Lack of Transportation (Non-Medical): Patient refused  Physical Activity: Unknown (12/01/2018)   Exercise Vital Sign    Days of Exercise per Week: Patient refused    Minutes of Exercise per Session: Patient refused  Stress: Unknown (12/01/2018)   [place]    Feeling of Stress : Patient refused  Social Connections: Unknown (12/01/2018)   Social Connection and Isolation Panel [NHANES]    Frequency of Communication with Friends and Family: Patient refused    Frequency of Social Gatherings with Friends and Family: Patient refused    Attends Religious Services: Patient refused    Marine scientist or Organizations: Patient refused    Attends Archivist Meetings: Patient refused    Marital Status: Patient refused     Family History: The patient's family history includes  Allergies in her father; Breast cancer in her maternal aunt; Heart disease in her father; Hypertension in her sister; Liver cancer in her mother; Pancreatic cancer in her mother; Rheum arthritis in her father and sister.  ROS:   Please see the history of present illness.    All other systems reviewed and are negative.  EKGs/Labs/Other Studies Reviewed:    The following studies were reviewed today:  May 17, 2022 in clinic device interrogation personally reviewed Battery longevity 5.5 years No underlying P waves for atrial sensing test Capture threshold stable on both leads Impedances stable on both leads AAIR to DDDR 60-1 10 with AV delays 200/180 A paced, V sensed 91.2%, a paced, V paced 6.2%    January 19, 2019 two-view chest x-ray personally reviewed shows a right-sided dual-chamber permanent pacemaker.   EKG:  The ekg ordered today demonstrates presenting rhythm shows an atrial paced, ventricular sensed rhythm with very long AV delays approaching 5 to 600 ms.  Forced ventricular pacing EKG performed shows appropriate AV sequential pacing with RV apical pacing morphology   Recent Labs: 12/20/2021: ALT 24; BUN 31; Creatinine, Ser 2.78; Potassium 4.2; Sodium 138 03/20/2022: Hemoglobin 10.9; Platelets 152  Recent Lipid Panel    Component Value Date/Time   CHOL 156 11/01/2016 0838   CHOL 144 03/15/2016 1206   CHOL 148 08/04/2014 0441   TRIG 93 11/01/2016 0838   TRIG 80 08/04/2014 0441   HDL 58 11/01/2016 0838   HDL 49 03/15/2016 1206   HDL 47 08/04/2014 0441   CHOLHDL 2.7 11/01/2016 0838   VLDL 19 11/01/2016 0838   VLDL 16 08/04/2014 0441   LDLCALC 79 11/01/2016 0838   LDLCALC 67 03/15/2016 1206   LDLCALC 85 08/04/2014 0441    Physical Exam:    VS:  BP 136/78  Pulse 76   Ht '5\' 4"'$  (1.626 m)   Wt 251 lb 6.4 oz (114 kg)   SpO2 93%   BMI 43.15 kg/m     Wt Readings from Last 3 Encounters:  05/17/22 251 lb 6.4 oz (114 kg)  03/22/22 256 lb (116.1 kg)  12/20/21 255  lb 11.2 oz (116 kg)     GEN:  Well nourished, well developed in no acute distress HEENT: Normal NECK: No JVD; No carotid bruits LYMPHATICS: No lymphadenopathy CARDIAC: RRR, no murmurs, rubs, gallops.  Right-sided pacemaker pocket without hematoma or significant pain RESPIRATORY:  Clear to auscultation without rales, wheezing or rhonchi  ABDOMEN: Soft, non-tender, non-distended MUSCULOSKELETAL:  No edema; No deformity  SKIN: Warm and dry NEUROLOGIC:  Alert and oriented x 3 PSYCHIATRIC:  Normal affect       ASSESSMENT:    1. Pacemaker   2. Sick sinus syndrome ([place])   3. Tachycardia-bradycardia syndrome ([place])   4. High risk medication use    PLAN:    In order of problems listed above:  #Permanent pacemaker in situ #Sick sinus syndrome #Tachycardia-bradycardia syndrome Patient is doing well with permanent pacemaker in place.  Device functioning appropriately.  Very long AV delays are allowing intrinsic ventricular conduction.  I do not think that she is getting any hemodynamic benefit from the AV synchrony given this degree of AV delay but I will keep the program the same nonetheless.  #High risk med use Amiodarone is helping maintain normal rhythm.  Continue current dose.  Check CMP, TSH and free T4.  Follow-up 6 months with primary cardiologist and with our clinic in 1 year for routine device monitoring    Total time spent with patient today 54 minutes. This includes reviewing records, evaluating the patient and coordinating care.  Medication Adjustments/Labs and Tests Ordered: Current medicines are reviewed at length with the patient today.  Concerns regarding medicines are outlined above.  Orders Placed This Encounter  Procedures   EKG 12-Lead   No orders of the defined types were placed in this encounter.    Signed, Hilton Cork. Quentin Ore, MD, [facility] 05/17/2022 11:17 AM    Electrophysiology [facility]

## 2022-05-18 DIAGNOSIS — M1712 Unilateral primary osteoarthritis, left knee: Secondary | ICD-10-CM | POA: Diagnosis not present

## 2022-05-19 ENCOUNTER — Telehealth: Payer: Self-pay

## 2022-05-19 NOTE — Telephone Encounter (Signed)
I was able to get the patient into our [facility] system. She is in paceart. It looks like she has not transmitted since 12/20/2021. I called to see if she still have her home remote monitor. If she does not I was going to order her a monitor. If she has a monitor I was going to help her send a transmission.

## 2022-05-22 DIAGNOSIS — I480 Paroxysmal atrial fibrillation: Secondary | ICD-10-CM | POA: Diagnosis not present

## 2022-05-23 DIAGNOSIS — H353221 Exudative age-related macular degeneration, left eye, with active choroidal neovascularization: Secondary | ICD-10-CM | POA: Diagnosis not present

## 2022-05-23 NOTE — Telephone Encounter (Signed)
I spoke with the patient and ordered her a new monitor. I also sent her a return kit.

## 2022-05-23 NOTE — Telephone Encounter (Signed)
LMOVM for patient to return device clinic call. 

## 2022-05-25 DIAGNOSIS — M1712 Unilateral primary osteoarthritis, left knee: Secondary | ICD-10-CM | POA: Diagnosis not present

## 2022-05-29 DIAGNOSIS — E7800 Pure hypercholesterolemia, unspecified: Secondary | ICD-10-CM | POA: Diagnosis not present

## 2022-06-01 DIAGNOSIS — G4733 Obstructive sleep apnea (adult) (pediatric): Secondary | ICD-10-CM | POA: Diagnosis not present

## 2022-06-12 ENCOUNTER — Encounter: Payer: Self-pay | Admitting: Internal Medicine

## 2022-06-12 DIAGNOSIS — I5032 Chronic diastolic (congestive) heart failure: Secondary | ICD-10-CM | POA: Diagnosis not present

## 2022-06-12 DIAGNOSIS — N184 Chronic kidney disease, stage 4 (severe): Secondary | ICD-10-CM | POA: Diagnosis not present

## 2022-06-12 DIAGNOSIS — G4733 Obstructive sleep apnea (adult) (pediatric): Secondary | ICD-10-CM | POA: Diagnosis not present

## 2022-06-12 DIAGNOSIS — H93A1 Pulsatile tinnitus, right ear: Secondary | ICD-10-CM | POA: Diagnosis not present

## 2022-06-12 DIAGNOSIS — D631 Anemia in chronic kidney disease: Secondary | ICD-10-CM | POA: Diagnosis not present

## 2022-06-12 DIAGNOSIS — E1122 Type 2 diabetes mellitus with diabetic chronic kidney disease: Secondary | ICD-10-CM | POA: Diagnosis not present

## 2022-06-12 DIAGNOSIS — Z1389 Encounter for screening for other disorder: Secondary | ICD-10-CM | POA: Diagnosis not present

## 2022-06-12 DIAGNOSIS — Z23 Encounter for immunization: Secondary | ICD-10-CM | POA: Diagnosis not present

## 2022-06-12 DIAGNOSIS — Z0000 Encounter for general adult medical examination without abnormal findings: Secondary | ICD-10-CM | POA: Diagnosis not present

## 2022-06-12 DIAGNOSIS — Z9989 Dependence on other enabling machines and devices: Secondary | ICD-10-CM | POA: Diagnosis not present

## 2022-06-12 DIAGNOSIS — I4891 Unspecified atrial fibrillation: Secondary | ICD-10-CM | POA: Diagnosis not present

## 2022-06-14 ENCOUNTER — Encounter: Payer: Self-pay | Admitting: Oncology

## 2022-06-16 DIAGNOSIS — G4733 Obstructive sleep apnea (adult) (pediatric): Secondary | ICD-10-CM | POA: Diagnosis not present

## 2022-06-19 DIAGNOSIS — I480 Paroxysmal atrial fibrillation: Secondary | ICD-10-CM | POA: Diagnosis not present

## 2022-06-20 ENCOUNTER — Inpatient Hospital Stay: Payer: PPO

## 2022-06-20 ENCOUNTER — Inpatient Hospital Stay: Payer: PPO | Admitting: Oncology

## 2022-06-27 DIAGNOSIS — H353221 Exudative age-related macular degeneration, left eye, with active choroidal neovascularization: Secondary | ICD-10-CM | POA: Diagnosis not present

## 2022-06-28 ENCOUNTER — Other Ambulatory Visit: Payer: Self-pay

## 2022-06-28 DIAGNOSIS — D631 Anemia in chronic kidney disease: Secondary | ICD-10-CM

## 2022-06-29 ENCOUNTER — Inpatient Hospital Stay: Payer: PPO | Attending: Oncology

## 2022-06-29 ENCOUNTER — Inpatient Hospital Stay (HOSPITAL_BASED_OUTPATIENT_CLINIC_OR_DEPARTMENT_OTHER): Payer: PPO | Admitting: Nurse Practitioner

## 2022-06-29 ENCOUNTER — Inpatient Hospital Stay: Payer: PPO

## 2022-06-29 VITALS — BP 141/57 | HR 63 | Temp 98.3°F | Resp 16 | Wt 249.0 lb

## 2022-06-29 DIAGNOSIS — Z79899 Other long term (current) drug therapy: Secondary | ICD-10-CM | POA: Diagnosis not present

## 2022-06-29 DIAGNOSIS — D649 Anemia, unspecified: Secondary | ICD-10-CM | POA: Diagnosis not present

## 2022-06-29 DIAGNOSIS — N184 Chronic kidney disease, stage 4 (severe): Secondary | ICD-10-CM

## 2022-06-29 DIAGNOSIS — D631 Anemia in chronic kidney disease: Secondary | ICD-10-CM

## 2022-06-29 DIAGNOSIS — D509 Iron deficiency anemia, unspecified: Secondary | ICD-10-CM | POA: Diagnosis not present

## 2022-06-29 LAB — CBC WITH DIFFERENTIAL/PLATELET
Abs Immature Granulocytes: 0.04 10*3/uL (ref 0.00–0.07)
Basophils Absolute: 0 10*3/uL (ref 0.0–0.1)
Basophils Relative: 0 %
Eosinophils Absolute: 0.2 10*3/uL (ref 0.0–0.5)
Eosinophils Relative: 3 %
HCT: 30.1 % — ABNORMAL LOW (ref 36.0–46.0)
Hemoglobin: 10.3 g/dL — ABNORMAL LOW (ref 12.0–15.0)
Immature Granulocytes: 1 %
Lymphocytes Relative: 23 %
Lymphs Abs: 1.4 10*3/uL (ref 0.7–4.0)
MCH: 27.1 pg (ref 26.0–34.0)
MCHC: 34.2 g/dL (ref 30.0–36.0)
MCV: 79.2 fL — ABNORMAL LOW (ref 80.0–100.0)
Monocytes Absolute: 0.5 10*3/uL (ref 0.1–1.0)
Monocytes Relative: 8 %
Neutro Abs: 3.9 10*3/uL (ref 1.7–7.7)
Neutrophils Relative %: 65 %
Platelets: 145 10*3/uL — ABNORMAL LOW (ref 150–400)
RBC: 3.8 MIL/uL — ABNORMAL LOW (ref 3.87–5.11)
RDW: 15.5 % (ref 11.5–15.5)
WBC: 6.1 10*3/uL (ref 4.0–10.5)
nRBC: 0 % (ref 0.0–0.2)

## 2022-06-29 LAB — IRON AND TIBC
Iron: 56 ug/dL (ref 28–170)
Saturation Ratios: 21 % (ref 10.4–31.8)
TIBC: 270 ug/dL (ref 250–450)
UIBC: 214 ug/dL

## 2022-06-29 LAB — FERRITIN: Ferritin: 156 ng/mL (ref 11–307)

## 2022-06-29 MED ORDER — EPOETIN ALFA-EPBX 40000 UNIT/ML IJ SOLN
40000.0000 [IU] | Freq: Once | INTRAMUSCULAR | Status: AC
  Administered 2022-06-29: 40000 [IU] via SUBCUTANEOUS
  Filled 2022-06-29: qty 1

## 2022-06-29 NOTE — Progress Notes (Signed)
Returns for follow-up. No new concerns or changes from baseline at this time.

## 2022-06-29 NOTE — Progress Notes (Signed)
Hematology/Oncology Consult note [facility]  [phone] [phone]  Patient Care Team: Dion Body, MD as PCP - General (Family Medicine) Vickie Epley, MD as PCP - Electrophysiology (Cardiology) Minna Merritts, MD as Consulting Physician (Cardiology) Anthonette Legato, MD as Consulting Physician (Nephrology) Wisenbaker Lance, MD as Consulting Physician (Cardiology) Lorelee Cover., MD (Ophthalmology) Edrick Kins, DPM as Consulting Physician (Podiatry) Sindy Guadeloupe, MD as Consulting Physician (Hematology and Oncology)   Name of the patient: [name] [name]  [id number]  Jul 27, 1935   Date of visit: 06/29/22  Diagnosis- chronic anemia multifactorial secondary to anemia of chronic kidney disease and a component of iron deficiency  Chief complaint/ Reason for visit-routine follow-up of anemia of chronic kidney disease to receive Retacrit  Heme/Onc history:  Patient is a 87 year old female with history of anemia of chronic kidney disease for which she is on Retacrit.  She also has hemoglobin C trait.  She has received IV iron in the past as well.  Hemoglobin stable between 10-11 on Retacrit  Interval history-Continues to feel at baseline with no specific complaints. Says she's doing well for her age.   ECOG PS- 1 Pain scale- 0   Review of systems- Review of Systems  Constitutional:  Negative for chills, fever, malaise/fatigue and weight loss.  HENT:  Negative for congestion, ear discharge and nosebleeds.   Eyes:  Negative for blurred vision.  Respiratory:  Negative for cough, hemoptysis, sputum production, shortness of breath and wheezing.   Cardiovascular:  Negative for chest pain, palpitations, orthopnea and claudication.  Gastrointestinal:  Negative for abdominal pain, blood in stool, constipation, diarrhea, heartburn, melena, nausea and vomiting.  Genitourinary:  Negative for dysuria, flank pain, frequency, hematuria and  urgency.  Musculoskeletal:  Negative for back pain, joint pain and myalgias.  Skin:  Negative for rash.  Neurological:  Negative for dizziness, tingling, focal weakness, seizures, weakness and headaches.  Endo/Heme/Allergies:  Does not bruise/bleed easily.  Psychiatric/Behavioral:  Negative for depression and suicidal ideas. The patient does not have insomnia.       Allergies  Allergen Reactions   Metoprolol Other (See Comments)    Bad dreams Patient stated that she had suicidal thoughts while taking this medication.    Diltiazem Other (See Comments)    Patient stated that she had bad dreams with this medication   Levofloxacin Nausea And Vomiting and Nausea Only          Past Medical History:  Diagnosis Date   1st degree AV block    a. PR 400 msec with Apacing   A-fib ([place])    Hx of   Anemia    Anxiety    Arthritis    knees and lower back    Chest pain    a. 2004 Cath:  reportedly nl;  b. 09/2013 Neg MV.   Chronic diastolic heart failure ([place])    a. 09/2013 Echo: EF 50-55%, mild LVH, mild to mod MR/TR.   Chronic kidney disease    Stage IV   Congestive heart disease (HCC)    Esophageal reflux    Gout    H/O hiatal hernia    History of UTI    Hyperlipidemia    Morbid obesity (HCC)    OSA on CPAP    CPAP   Pacemaker    copy of medtronic card in chart   PAF (paroxysmal atrial fibrillation) (HCC)    Seasonal allergies    SSS (sick sinus syndrome) ([place])  a. 2008 s/p MDT PPM;  b. 11/2013 Gen change- MDT ADDRL1 Adapta DC PPM, ser # [id number] H.   Symptomatic PVCs    Syncope and collapse    a. Felt to be vasovagal.   Thyroid disease    nodules on thyroid, with goiter, on no meds per pt   Type II diabetes mellitus ([place])    Type 2   Unspecified essential hypertension    controlled on meds   Unspecified glaucoma(365.9)    Vertigo    in past   Wears dentures    upper full and lower partial     Past Surgical History:  Procedure Laterality Date   APPENDECTOMY      CARDIAC CATHETERIZATION     CARDIOVERSION N/A 04/26/2017   Procedure: Cardioversion;  Surgeon: Minna Merritts, MD;  Location: [facility];  Service: Cardiovascular;  Laterality: N/A;   CATARACT EXTRACTION W/PHACO Left 07/21/2020   Procedure: CATARACT EXTRACTION PHACO AND INTRAOCULAR LENS PLACEMENT ([place]) LEFT DIABETIC;  Surgeon: Leandrew Koyanagi, MD;  Location: [place];  Service: Ophthalmology;  Laterality: Left;  10.25 1:13.2 14.0%   CATARACT EXTRACTION W/PHACO Right 08/11/2020   Procedure: CATARACT EXTRACTION PHACO AND INTRAOCULAR LENS PLACEMENT ([place]) RIGHT DIABETIC;  Surgeon: Leandrew Koyanagi, MD;  Location: [place];  Service: Ophthalmology;  Laterality: Right;  11.63 1:25.0 13.7%   DILATION AND CURETTAGE OF UTERUS     INCISION AND DRAINAGE OF WOUND Right 03/1998   "leg; it was like a boil" (12/01/2013)   INSERT / REPLACE / REMOVE PACEMAKER  2008; 12/01/2013   MDT ADDRL1 pacemaker - gen change by Dr Caryl Comes 12/01/2013   LUNG BIOPSY  2010   unc   PERMANENT PACEMAKER GENERATOR CHANGE N/A 12/01/2013   Procedure: PERMANENT PACEMAKER GENERATOR CHANGE;  Surgeon: Deboraha Sprang, MD;  [facility] C[facility];  Service: Cardiovascular;  Laterality: N/A;   VAGINAL HYSTERECTOMY  1970    Social History   Socioeconomic History   Marital status: Widowed    Spouse name: Not on file   Number of children: Y   Years of education: Not on file   Highest education level: Not on file  Occupational History   Occupation: retired  Tobacco Use   Smoking status: Former    Packs/day: 0.50    Years: 34.00    Total pack years: 17.00    Types: Cigarettes    Quit date: 08/15/1989    Years since quitting: 32.8   Smokeless tobacco: Never  Vaping Use   Vaping Use: Never used  Substance and Sexual Activity   Alcohol use: No    Alcohol/week: 0.0 standard drinks of alcohol   Drug use: No   Sexual activity: Never  Other Topics Concern   Not on file  Social History Narrative    Retired. Widowed. Regularly exercises.    Social Determinants of Health   Financial Resource Strain: Unknown (12/01/2018)   Overall Financial Resource Strain (CARDIA)    Difficulty of Paying Living Expenses: Patient refused  Food Insecurity: Unknown (12/01/2018)   Hunger Vital Sign    Worried About Running Out of Food in the Last Year: Patient refused    [place] in the Last Year: Patient refused  Transportation Needs: Unknown (12/01/2018)   PRAPARE - Transportation    Lack of Transportation (Medical): Patient refused    Lack of Transportation (Non-Medical): Patient refused  Physical Activity: Unknown (12/01/2018)   Exercise Vital Sign    Days of Exercise per Week: Patient refused  Minutes of Exercise per Session: Patient refused  Stress: Unknown (12/01/2018)   [place]    Feeling of Stress : Patient refused  Social Connections: Unknown (12/01/2018)   Social Connection and Isolation Panel [NHANES]    Frequency of Communication with Friends and Family: Patient refused    Frequency of Social Gatherings with Friends and Family: Patient refused    Attends Religious Services: Patient refused    Active Member of Clubs or Organizations: Patient refused    Attends Archivist Meetings: Patient refused    Marital Status: Patient refused  Intimate Partner Violence: Unknown (12/01/2018)   Humiliation, Afraid, Rape, and Kick questionnaire    Fear of Current or Ex-Partner: Patient refused    Emotionally Abused: Patient refused    Physically Abused: Patient refused    Sexually Abused: Patient refused    Family History  Problem Relation Age of Onset   Heart disease Father    Rheum arthritis Father    Allergies Father    Liver cancer Mother    Pancreatic cancer Mother    Hypertension Sister    Rheum arthritis Sister    Breast cancer Maternal Aunt      Current Outpatient Medications:     acetaminophen (TYLENOL) 500 MG tablet, Take 1,000 mg by mouth every 6 (six) hours as needed for moderate pain. , Disp: , Rfl:    allopurinol (ZYLOPRIM) 100 MG tablet, Take 1 tablet by mouth daily., Disp: , Rfl:    amiodarone (PACERONE) 200 MG tablet, Take 200 mg by mouth daily., Disp: , Rfl:    amLODipine (NORVASC) 5 MG tablet, Take 5 mg by mouth daily., Disp: , Rfl:    atorvastatin (LIPITOR) 20 MG tablet, TAKE 1 TABLET (20 MG TOTAL) BY MOUTH DAILY., Disp: 90 tablet, Rfl: 0   azelastine (ASTELIN) 0.1 % nasal spray, Place into the nose. (Patient not taking: Reported on 05/17/2022), Disp: , Rfl:    Blood Glucose Monitoring Suppl (FIFTY50 GLUCOSE METER 2.0) w/Device KIT, Use as directed ONE TOUCH DX E11.22 (Patient not taking: Reported on 05/17/2022), Disp: , Rfl:    calcitRIOL (ROCALTROL) 0.25 MCG capsule, TAKE 1 CAPSULE (0.25 MCG TOTAL) BY MOUTH 1 (ONE) TIME EACH DAY, Disp: , Rfl:    carvedilol (COREG) 25 MG tablet, Take 25-50 mg by mouth See admin instructions. Take 1 tablet (25MG) by mouth every morning and 2 tablets (50MG) by mouth every night before bed, Disp: , Rfl:    diclofenac sodium (VOLTAREN) 1 % GEL, Apply 2 g topically 4 (four) times daily. (Patient not taking: Reported on 05/17/2022), Disp: , Rfl:    esomeprazole (NEXIUM) 40 MG capsule, Take by mouth. (Patient not taking: Reported on 05/17/2022), Disp: , Rfl:    ferrous sulfate 325 (65 FE) MG EC tablet, Take 325 mg by mouth daily. , Disp: , Rfl:    furosemide (LASIX) 20 MG tablet, Take 40 mg by mouth daily., Disp: , Rfl:    glipiZIDE (GLUCOTROL) 10 MG tablet, TAKE 1 TABLET (10 MG TOTAL) BY MOUTH 2 (TWO) TIMES DAILY BEFORE A MEAL., Disp: 180 tablet, Rfl: 0   Glycerin, Laxative, (FLEET LIQUID GLYCERIN SUPP RE), Place 1 Dose rectally daily as needed (constipation)., Disp: , Rfl:    KLOR-CON M10 10 MEQ tablet, Take 1 tablet by mouth daily., Disp: , Rfl: 3   meclizine (ANTIVERT) 25 MG tablet, Take 1 tablet by mouth 3 (three) times daily as  needed., Disp: , Rfl:  metFORMIN (GLUCOPHAGE-XR) 500 MG 24 hr tablet, Take by mouth. (Patient not taking: Reported on 05/17/2022), Disp: , Rfl:    mometasone (ELOCON) 0.1 % cream, Apply topically daily. Apply to affected area as directed two times a day, Disp: 45 g, Rfl: 0   Multiple Vitamins-Minerals (CENTRUM SILVER PO), Take 1 tablet by mouth daily. , Disp: , Rfl:    olmesartan (BENICAR) 40 MG tablet, Take 1 tablet by mouth daily., Disp: , Rfl:    OneTouch Delica Lancets 79T MISC, , Disp: , Rfl:    ONETOUCH VERIO test strip, 3 (three) times daily., Disp: , Rfl:    pantoprazole (PROTONIX) 40 MG tablet, Take 1 tablet by mouth daily., Disp: , Rfl:    polyethylene glycol (MIRALAX / GLYCOLAX) packet, Take 17 g by mouth daily as needed for moderate constipation., Disp: , Rfl:    simethicone (MYLICON) 40 JQ/3.0SP drops, Take by mouth., Disp: , Rfl:    sitaGLIPtin (JANUVIA) 25 MG tablet, Take by mouth., Disp: , Rfl:    traZODone (DESYREL) 50 MG tablet, Take 50 mg by mouth at bedtime., Disp: , Rfl:    warfarin (COUMADIN) 1 MG tablet, Take 1 mg by mouth as directed., Disp: , Rfl:    warfarin (COUMADIN) 3 MG tablet, TAKE 1-1.5 TABLETS DAILY AT 6 PM. TAKE 1 TAB ON TUES, THURS, SAT, SUN. TAKE 4.5 MG ON MON, WED, FRI (Patient not taking: Reported on 05/17/2022), Disp: 100 tablet, Rfl: 3 No current facility-administered medications for this visit.  Facility-Administered Medications Ordered in Other Visits:    epoetin alfa-epbx (RETACRIT) injection 40,000 Units, 40,000 Units, Subcutaneous, Once, Verlon Au, NP  Physical exam:  Vitals:   06/29/22 1443 06/29/22 1448  BP:  (!) 141/57  Pulse:  63  Resp:  16  Temp:  98.3 F (36.8 C)  TempSrc:  Oral  SpO2:  98%  Weight: 249 lb (112.9 kg)    Physical Exam Constitutional:      General: She is not in acute distress. Cardiovascular:     Rate and Rhythm: Normal rate and regular rhythm.     Heart sounds: Normal heart sounds.  Pulmonary:     Effort:  Pulmonary effort is normal.     Breath sounds: Normal breath sounds.  Abdominal:     General: Bowel sounds are normal.     Palpations: Abdomen is soft.  Skin:    General: Skin is warm and dry.  Neurological:     Mental Status: She is alert and oriented to person, place, and time.         Latest Ref Rng & Units 12/20/2021   10:38 AM  CMP  Glucose 70 - 99 mg/dL 154   BUN 8 - 23 mg/dL 31   Creatinine 0.44 - 1.00 mg/dL 2.78   Sodium 135 - 145 mmol/L 138   Potassium 3.5 - 5.1 mmol/L 4.2   Chloride 98 - 111 mmol/L 102   CO2 22 - 32 mmol/L 26   Calcium 8.9 - 10.3 mg/dL 9.5   Total Protein 6.5 - 8.1 g/dL 6.7   Total Bilirubin 0.3 - 1.2 mg/dL 0.8   Alkaline Phos 38 - 126 U/L 72   AST 15 - 41 U/L 25   ALT 0 - 44 U/L 24       Latest Ref Rng & Units 06/29/2022    2:16 PM  CBC  WBC 4.0 - 10.5 K/uL 6.1   Hemoglobin 12.0 - 15.0 g/dL 10.3   Hematocrit 36.0 -  46.0 % 30.1   Platelets 150 - 400 K/uL 145     Assessment and plan- Patient is a 87 y.o. female with anemia of chronic kidney disease on Retacrit here for routine follow-up  Patient is currently doing well and has not received any Retacrit since October 2022.  Her hemoglobin presently is 10.3 and therefore she does not require any Retacrit at this time. She has persistent microcytosis but iron studies and ferritin are pending at time of visit. Proceed with retacrit today.   Visit Diagnosis No diagnosis found.  Disposition: 3 mo- lab (H&), +/- retacrit 6 mo- lab (cbc, ferritin, iron studies), Dr. Janese Banks, +/- retacrit- la 06/29/2022 3:02 PM

## 2022-06-30 IMAGING — MG DIGITAL SCREENING BILAT W/ TOMO W/ CAD
6 of 10 series · 6 of 30 positions shown · non-contrast
Comparison: Previous exam(s).

ACR Breast Density Category a: The breast tissue is almost entirely
fatty.

CLINICAL DATA: Screening.

EXAM:
DIGITAL SCREENING BILATERAL MAMMOGRAM WITH TOMO AND CAD

[L MLO synth-2D]
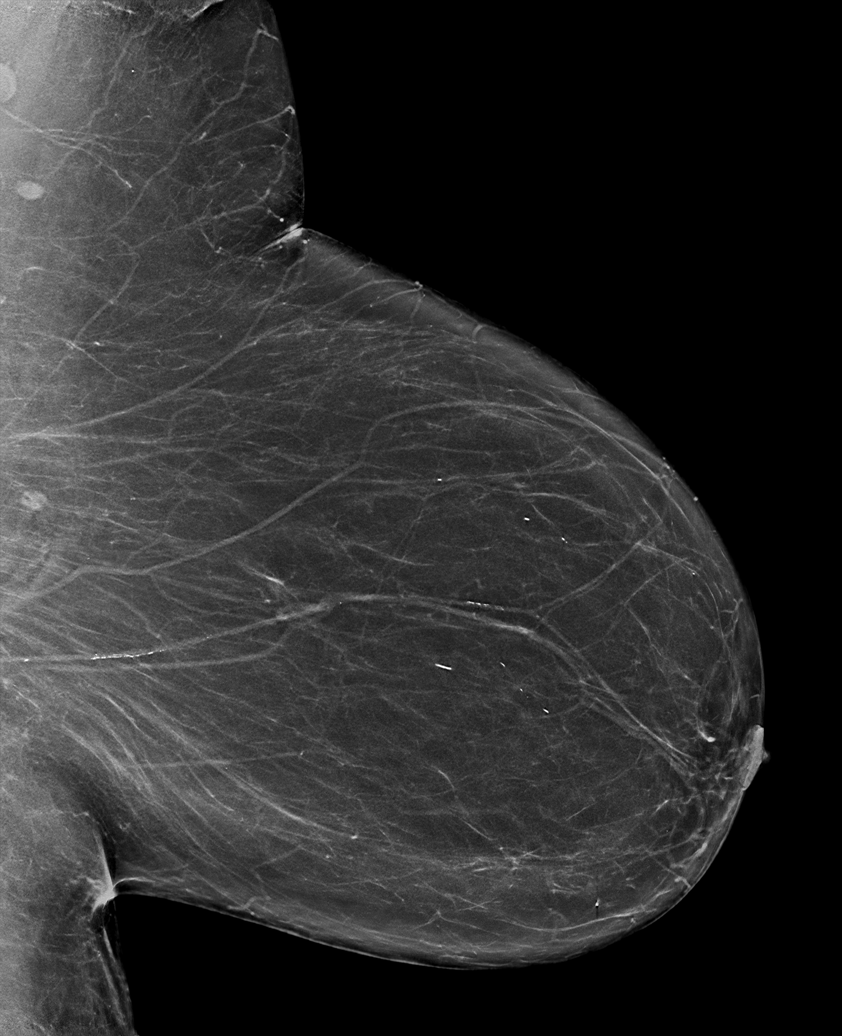

[R MLO synth-2D (1 of 2)]
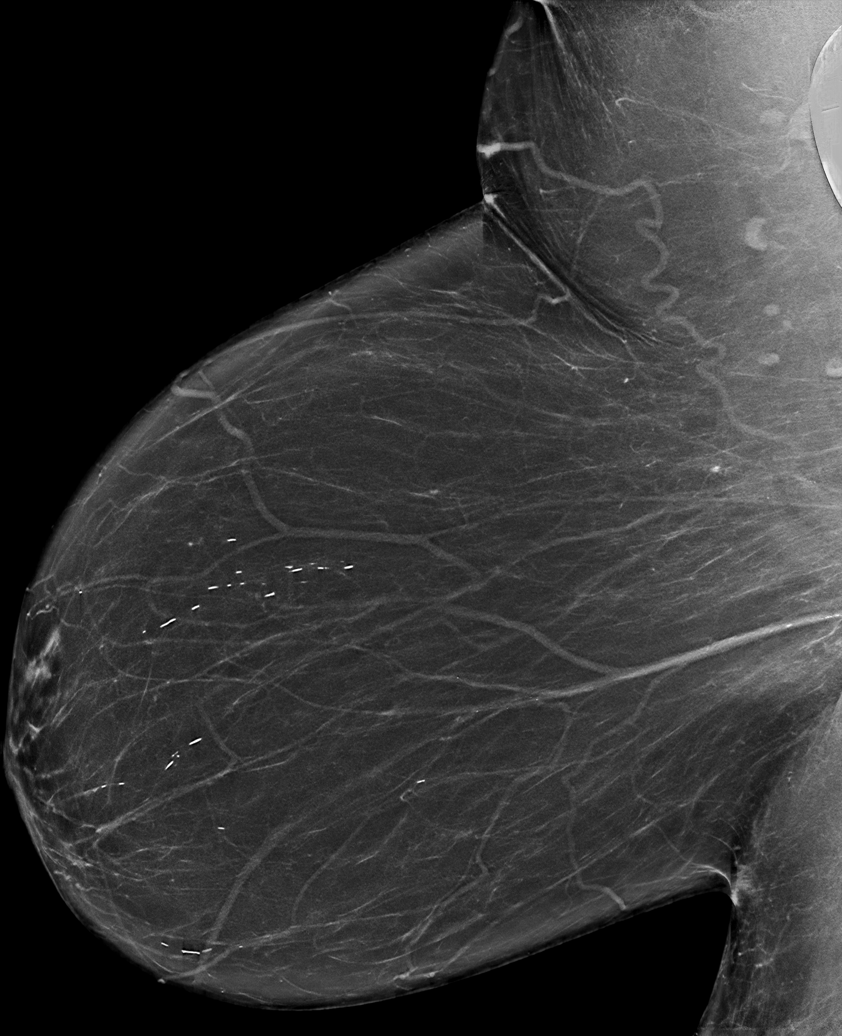

[L CC synth-2D]
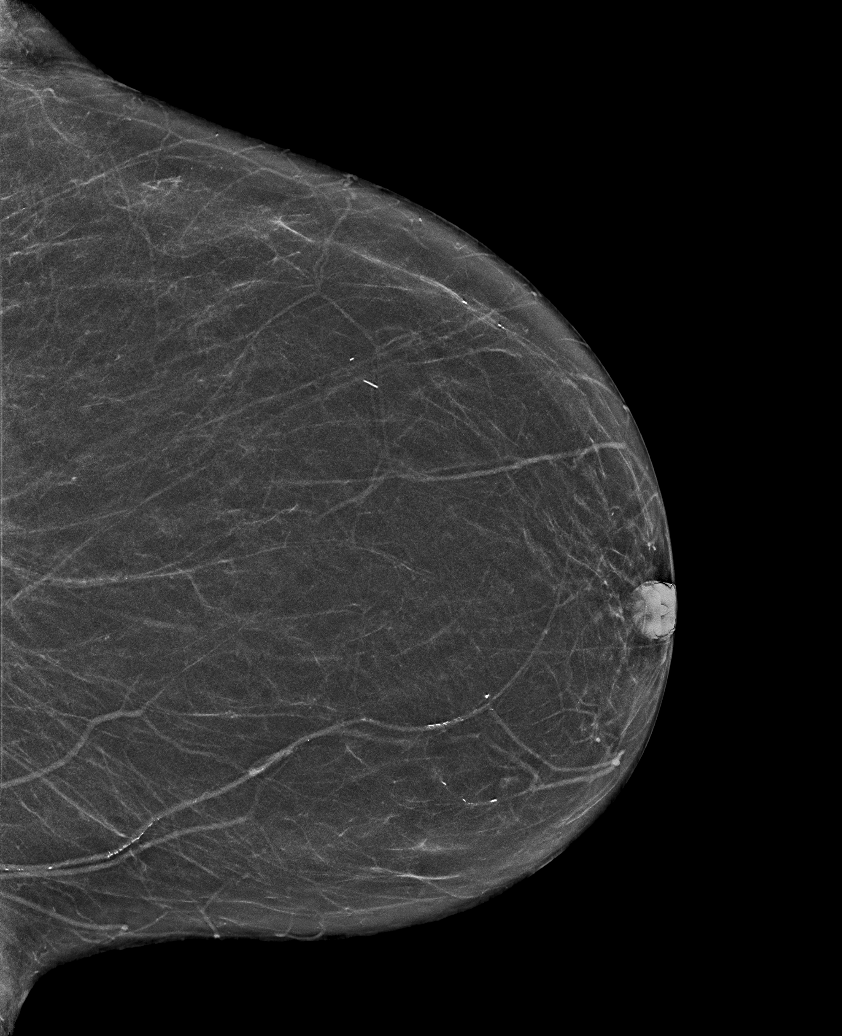

[R MLO synth-2D (2 of 2)]
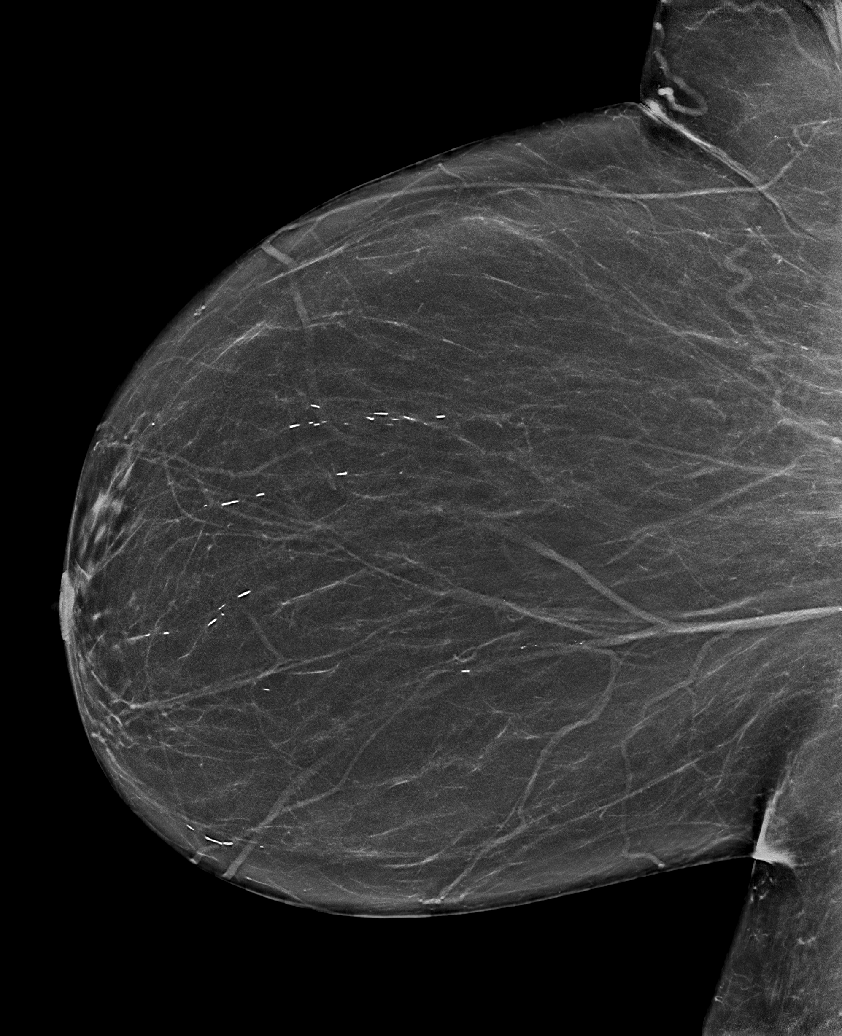

[R CC synth-2D]
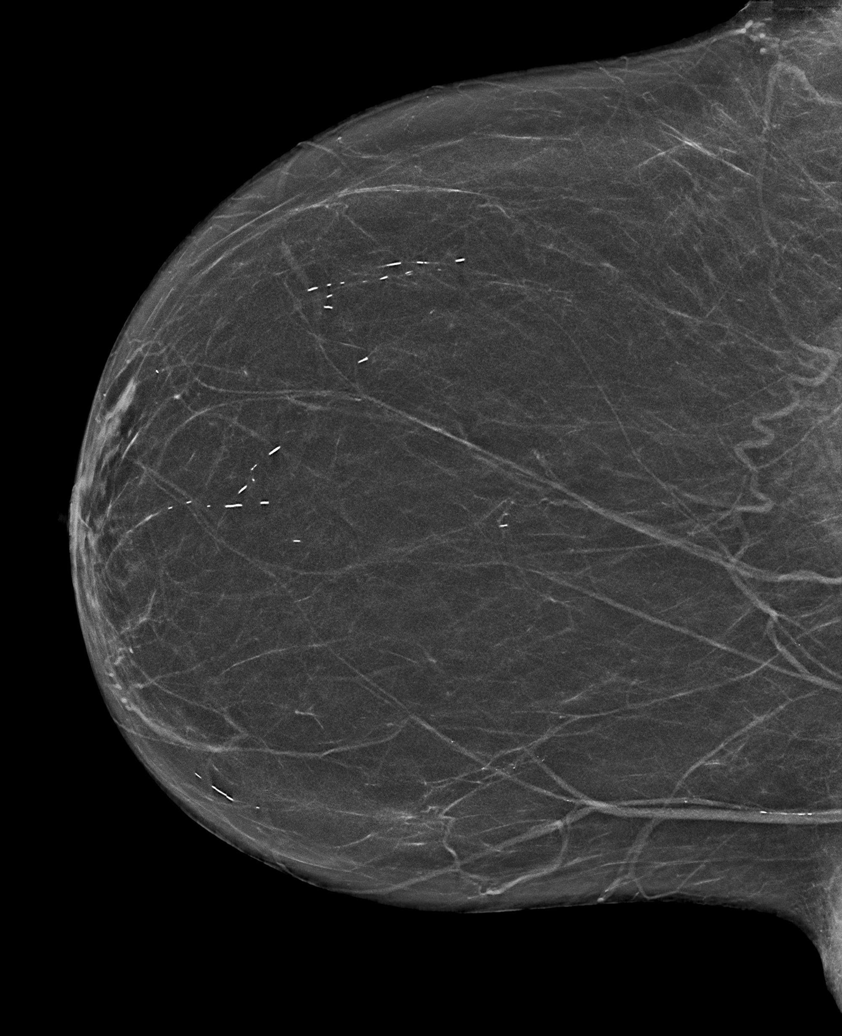

[R MLO tomo · tomo slice 35/68.0]
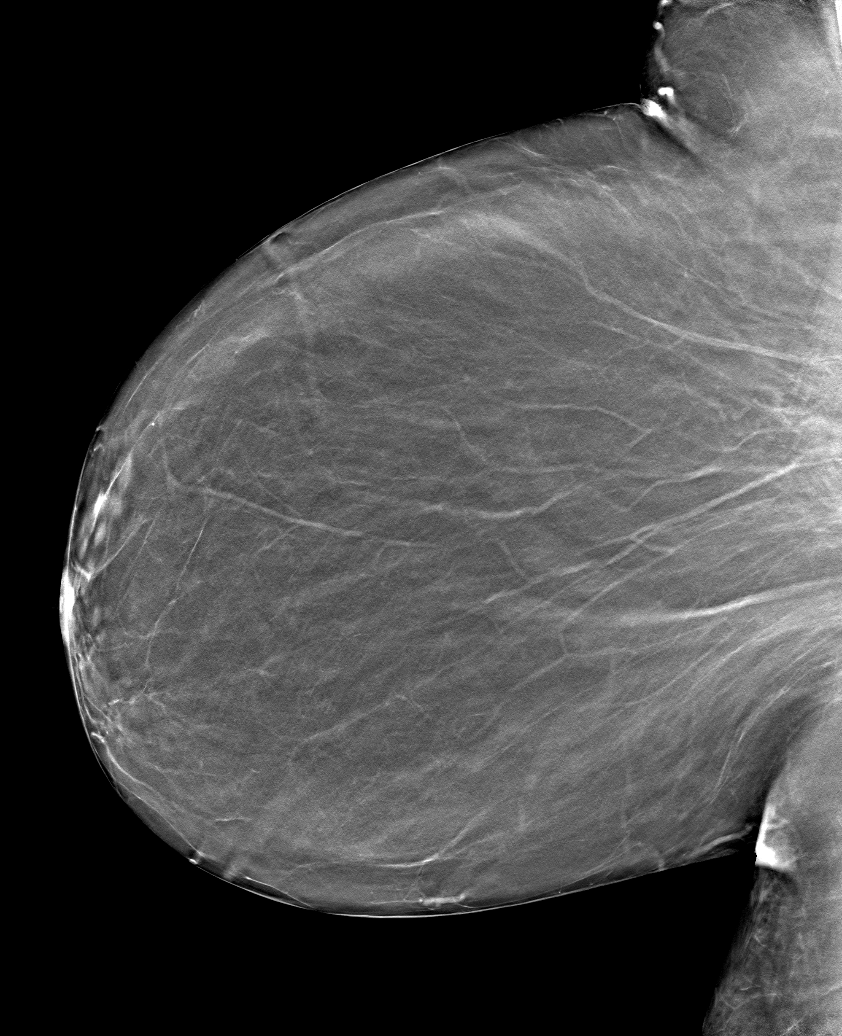

[6 of 30 positions shown; findings below may reference images not displayed]

FINDINGS: There are no findings suspicious for malignancy. Images were
processed with CAD.
IMPRESSION: No mammographic evidence of malignancy. A result letter of this
screening mammogram will be mailed directly to the patient.

RECOMMENDATION:
Screening mammogram in one year. (Code:[id number])

BI-RADS CATEGORY  1: Negative.

## 2022-07-12 ENCOUNTER — Telehealth: Payer: Self-pay | Admitting: Cardiovascular Disease

## 2022-07-12 ENCOUNTER — Other Ambulatory Visit (HOSPITAL_BASED_OUTPATIENT_CLINIC_OR_DEPARTMENT_OTHER): Payer: Self-pay | Admitting: Cardiovascular Disease

## 2022-07-12 ENCOUNTER — Other Ambulatory Visit: Payer: Self-pay

## 2022-07-12 DIAGNOSIS — E042 Nontoxic multinodular goiter: Secondary | ICD-10-CM | POA: Diagnosis not present

## 2022-07-12 DIAGNOSIS — E1122 Type 2 diabetes mellitus with diabetic chronic kidney disease: Secondary | ICD-10-CM | POA: Diagnosis not present

## 2022-07-12 DIAGNOSIS — N184 Chronic kidney disease, stage 4 (severe): Secondary | ICD-10-CM | POA: Diagnosis not present

## 2022-07-12 MED ORDER — AMIODARONE HCL 200 MG PO TABS: 200.0000 mg | ORAL_TABLET | Freq: Every day | ORAL | 3 refills | Status: DC

## 2022-07-12 NOTE — Telephone Encounter (Signed)
Refill send in to [facility]. Notified the patient.

## 2022-07-12 NOTE — Telephone Encounter (Signed)
*  STAT* If patient is at the pharmacy, call can be transferred to refill team.   1. Which medications need to be refilled? (please list name of each medication and dose if known) amiodarone (PACERONE) 200 MG tablet   2. Which pharmacy/location (including street and city if local pharmacy) is medication to be sent to? [id number] [place]. Do they need a 30 day or 90 day supply? 90 day

## 2022-07-12 NOTE — Telephone Encounter (Signed)
*  STAT* If patient is at the pharmacy, call can be transferred to refill team.   1. Which medications need to be refilled? (please list name of each medication and dose if known) amiodarone (PACERONE) 200 MG tablet  2. Which pharmacy/location (including street and city if local pharmacy) is medication to be sent to? [id number] [place]. Do they need a 30 day or 90 day supply? [address]

## 2022-07-12 NOTE — Telephone Encounter (Signed)
Please advise if ok to refill historical medication. Pt called twice to request refill.

## 2022-07-14 DIAGNOSIS — G4733 Obstructive sleep apnea (adult) (pediatric): Secondary | ICD-10-CM | POA: Diagnosis not present

## 2022-07-14 DIAGNOSIS — Z9989 Dependence on other enabling machines and devices: Secondary | ICD-10-CM | POA: Diagnosis not present

## 2022-07-14 DIAGNOSIS — B351 Tinea unguium: Secondary | ICD-10-CM | POA: Diagnosis not present

## 2022-07-14 DIAGNOSIS — L851 Acquired keratosis [keratoderma] palmaris et plantaris: Secondary | ICD-10-CM | POA: Diagnosis not present

## 2022-07-14 DIAGNOSIS — I10 Essential (primary) hypertension: Secondary | ICD-10-CM | POA: Diagnosis not present

## 2022-07-14 DIAGNOSIS — E119 Type 2 diabetes mellitus without complications: Secondary | ICD-10-CM | POA: Diagnosis not present

## 2022-07-14 DIAGNOSIS — I480 Paroxysmal atrial fibrillation: Secondary | ICD-10-CM | POA: Diagnosis not present

## 2022-07-14 DIAGNOSIS — E1122 Type 2 diabetes mellitus with diabetic chronic kidney disease: Secondary | ICD-10-CM | POA: Diagnosis not present

## 2022-07-17 DIAGNOSIS — I480 Paroxysmal atrial fibrillation: Secondary | ICD-10-CM | POA: Diagnosis not present

## 2022-07-18 DIAGNOSIS — N2581 Secondary hyperparathyroidism of renal origin: Secondary | ICD-10-CM | POA: Diagnosis not present

## 2022-07-18 DIAGNOSIS — I10 Essential (primary) hypertension: Secondary | ICD-10-CM | POA: Diagnosis not present

## 2022-07-18 DIAGNOSIS — R809 Proteinuria, unspecified: Secondary | ICD-10-CM | POA: Diagnosis not present

## 2022-07-18 DIAGNOSIS — N184 Chronic kidney disease, stage 4 (severe): Secondary | ICD-10-CM | POA: Diagnosis not present

## 2022-07-18 DIAGNOSIS — D631 Anemia in chronic kidney disease: Secondary | ICD-10-CM | POA: Diagnosis not present

## 2022-07-18 DIAGNOSIS — E1122 Type 2 diabetes mellitus with diabetic chronic kidney disease: Secondary | ICD-10-CM | POA: Diagnosis not present

## 2022-07-19 DIAGNOSIS — I6523 Occlusion and stenosis of bilateral carotid arteries: Secondary | ICD-10-CM | POA: Diagnosis not present

## 2022-07-19 DIAGNOSIS — H93A3 Pulsatile tinnitus, bilateral: Secondary | ICD-10-CM | POA: Diagnosis not present

## 2022-07-25 DIAGNOSIS — I10 Essential (primary) hypertension: Secondary | ICD-10-CM | POA: Diagnosis not present

## 2022-07-25 DIAGNOSIS — E1122 Type 2 diabetes mellitus with diabetic chronic kidney disease: Secondary | ICD-10-CM | POA: Diagnosis not present

## 2022-07-25 DIAGNOSIS — D631 Anemia in chronic kidney disease: Secondary | ICD-10-CM | POA: Diagnosis not present

## 2022-07-25 DIAGNOSIS — N184 Chronic kidney disease, stage 4 (severe): Secondary | ICD-10-CM | POA: Diagnosis not present

## 2022-07-25 DIAGNOSIS — N2581 Secondary hyperparathyroidism of renal origin: Secondary | ICD-10-CM | POA: Diagnosis not present

## 2022-08-01 DIAGNOSIS — H353221 Exudative age-related macular degeneration, left eye, with active choroidal neovascularization: Secondary | ICD-10-CM | POA: Diagnosis not present

## 2022-08-22 DIAGNOSIS — Z7984 Long term (current) use of oral hypoglycemic drugs: Secondary | ICD-10-CM | POA: Diagnosis not present

## 2022-08-22 DIAGNOSIS — N2581 Secondary hyperparathyroidism of renal origin: Secondary | ICD-10-CM | POA: Diagnosis not present

## 2022-08-22 DIAGNOSIS — E261 Secondary hyperaldosteronism: Secondary | ICD-10-CM | POA: Diagnosis not present

## 2022-08-22 DIAGNOSIS — E1122 Type 2 diabetes mellitus with diabetic chronic kidney disease: Secondary | ICD-10-CM | POA: Diagnosis not present

## 2022-08-22 DIAGNOSIS — G4733 Obstructive sleep apnea (adult) (pediatric): Secondary | ICD-10-CM | POA: Diagnosis not present

## 2022-08-22 DIAGNOSIS — I5032 Chronic diastolic (congestive) heart failure: Secondary | ICD-10-CM | POA: Diagnosis not present

## 2022-08-22 DIAGNOSIS — I4819 Other persistent atrial fibrillation: Secondary | ICD-10-CM | POA: Diagnosis not present

## 2022-08-22 DIAGNOSIS — Z6841 Body Mass Index (BMI) 40.0 and over, adult: Secondary | ICD-10-CM | POA: Diagnosis not present

## 2022-08-22 DIAGNOSIS — D6869 Other thrombophilia: Secondary | ICD-10-CM | POA: Diagnosis not present

## 2022-08-22 DIAGNOSIS — N184 Chronic kidney disease, stage 4 (severe): Secondary | ICD-10-CM | POA: Diagnosis not present

## 2022-09-05 DIAGNOSIS — H353221 Exudative age-related macular degeneration, left eye, with active choroidal neovascularization: Secondary | ICD-10-CM | POA: Diagnosis not present

## 2022-09-18 ENCOUNTER — Encounter: Payer: Self-pay | Admitting: Oncology

## 2022-09-24 NOTE — Progress Notes (Unsigned)
Cardiology Office Note  Date:  09/25/2022   ID:  [name] [name], DOB 27-Mar-1935, MRN [id number]  PCP:  Kirk Ruths, MD   Chief Complaint  Patient presents with   6 month follow up     "Doing well." Medications reviewed by the patient verbally.     HPI:  [name] [name] is a 87 yo woman with a history of  Ejection fraction 50-55%  Paroxysmal atrial fibrillation, on warfarin Morbid obesity,  symptomatic palpitations/PVCs,  pacemaker implantation for sick sinus syndrome/tachybradycardia syndrome with chronic symptoms of palpitations,  shortness of breath with exertion,  chronic fatigue Obstructive sleep apnea on CPAP Chronic renal failure who presents for f/u of her atrial fibrillation and shortness of breath Sees Dr. Ouida Sills at [facility]  Last seen by myself in clinic April 2023 Seen by EP July 2023 Pacer functioning well  [place] in sept 2023  Sometimes wakes at night, checks BP,  Trouble wearing CPAP Supposed to get new machine PMD started sleeping pill , doxepin (SINEQUAN)  Had cortisone on left knee, "did not help" Tingling down right leg Uses a cane [facility]es [facility] [employer] 3x a [facility]  Takes lasix 40 Qam, Swelling progresses in the day GFR 17 INR 3.5 A1C 7.0 Total chol 175, LDL 97  EKG personally reviewed by myself on todays visit Paced rhythm rate 66 bpm  Echocardiogram May 2022 NORMAL LEFT VENTRICULAR SYSTOLIC FUNCTION  NORMAL RIGHT VENTRICULAR SYSTOLIC FUNCTION  NO VALVULAR STENOSIS  MILD to MODERATE MR  MODERATE TR  MILD PR  EF 55-60%  Closest EF: >55% (Estimated)  Size: MILDLY ENLARGED  Mitral: MILD MR  Tricuspid: MODERATE TR  RV Masses: CATHETER IN RV   Stress test October 2020, Myoview No significant ischemia, normal ejection fraction  History of sleep disorder, on CPAP   PMH:   has a past medical history of 1st degree AV block, A-fib (HCC), Anemia, Anxiety, Arthritis, Chest pain, Chronic diastolic heart failure (HCC), Chronic kidney disease,  Congestive heart disease ([place]), Esophageal reflux, Gout, H/O hiatal hernia, History of UTI, Hyperlipidemia, Morbid obesity ([place]), OSA on CPAP, Pacemaker, PAF (paroxysmal atrial fibrillation) ([place]), Seasonal allergies, SSS (sick sinus syndrome) ([place]), Symptomatic PVCs, Syncope and collapse, Thyroid disease, Type II diabetes mellitus ([place]), Unspecified essential hypertension, Unspecified glaucoma(365.9), Vertigo, and Wears dentures.  PSH:    Past Surgical History:  Procedure Laterality Date   APPENDECTOMY     CARDIAC CATHETERIZATION     CARDIOVERSION N/A 04/26/2017   Procedure: Cardioversion;  Surgeon: Minna Merritts, MD;  Location: [facility];  Service: Cardiovascular;  Laterality: N/A;   CATARACT EXTRACTION W/PHACO Left 07/21/2020   Procedure: CATARACT EXTRACTION PHACO AND INTRAOCULAR LENS PLACEMENT ([place]) LEFT DIABETIC;  Surgeon: Leandrew Koyanagi, MD;  Location: [place];  Service: Ophthalmology;  Laterality: Left;  10.25 1:13.2 14.0%   CATARACT EXTRACTION W/PHACO Right 08/11/2020   Procedure: CATARACT EXTRACTION PHACO AND INTRAOCULAR LENS PLACEMENT ([place]) RIGHT DIABETIC;  Surgeon: Leandrew Koyanagi, MD;  Location: [place];  Service: Ophthalmology;  Laterality: Right;  11.63 1:25.0 13.7%   DILATION AND CURETTAGE OF UTERUS     INCISION AND DRAINAGE OF WOUND Right 03/1998   "leg; it was like a boil" (12/01/2013)   INSERT / REPLACE / REMOVE PACEMAKER  2008; 12/01/2013   MDT ADDRL1 pacemaker - gen change by Dr Caryl Comes 12/01/2013   LUNG BIOPSY  2010   unc   PERMANENT PACEMAKER GENERATOR CHANGE N/A 12/01/2013   Procedure: PERMANENT PACEMAKER GENERATOR CHANGE;  Surgeon: Revonda Standard  Caryl Comes, MD;  Location[facility] CATH LAB;  Service: Cardiovascular;  Laterality: N/A;   VAGINAL HYSTERECTOMY  1970    Current Outpatient Medications  Medication Sig Dispense Refill   acetaminophen (TYLENOL) 500 MG tablet Take 1,000 mg by mouth every 6 (six) hours as needed for moderate pain.       allopurinol (ZYLOPRIM) 100 MG tablet Take 1 tablet by mouth daily.     amiodarone (PACERONE) 200 MG tablet Take 1 tablet (200 mg total) by mouth daily. 90 tablet 3   amLODipine (NORVASC) 5 MG tablet Take 5 mg by mouth daily.     atorvastatin (LIPITOR) 20 MG tablet TAKE 1 TABLET (20 MG TOTAL) BY MOUTH DAILY. 90 tablet 0   calcitRIOL (ROCALTROL) 0.25 MCG capsule TAKE 1 CAPSULE (0.25 MCG TOTAL) BY MOUTH 1 (ONE) TIME EACH DAY     carvedilol (COREG) 25 MG tablet Take 25-50 mg by mouth See admin instructions. Take 1 tablet (25MG) by mouth every morning and 2 tablets (50MG) by mouth every night before bed     ferrous sulfate 325 (65 FE) MG EC tablet Take 325 mg by mouth daily.      furosemide (LASIX) 20 MG tablet Take 40 mg by mouth daily.     glipiZIDE (GLUCOTROL) 10 MG tablet TAKE 1 TABLET (10 MG TOTAL) BY MOUTH 2 (TWO) TIMES DAILY BEFORE A MEAL. 180 tablet 0   Glycerin, Laxative, (FLEET LIQUID GLYCERIN SUPP RE) Place 1 Dose rectally daily as needed (constipation).     KLOR-CON M10 10 MEQ tablet Take 1 tablet by mouth daily.  3   meclizine (ANTIVERT) 25 MG tablet Take 1 tablet by mouth 3 (three) times daily as needed.     mometasone (ELOCON) 0.1 % cream Apply topically daily. Apply to affected area as directed two times a day 45 g 0   Multiple Vitamins-Minerals (CENTRUM SILVER PO) Take 1 tablet by mouth daily.      olmesartan (BENICAR) 40 MG tablet Take 1 tablet by mouth daily.     polyethylene glycol (MIRALAX / GLYCOLAX) packet Take 17 g by mouth daily as needed for moderate constipation.     simethicone (MYLICON) 40 FT/7.3UK drops Take by mouth.     sitaGLIPtin (JANUVIA) 25 MG tablet Take by mouth.     warfarin (COUMADIN) 1 MG tablet Take 1 mg by mouth as directed.     warfarin (COUMADIN) 3 MG tablet TAKE 1-1.5 TABLETS DAILY AT 6 PM. TAKE 1 TAB ON TUES, THURS, SAT, SUN. TAKE 4.5 MG ON MON, WED, FRI 100 tablet 3   Blood Glucose Monitoring Suppl (FIFTY50 GLUCOSE METER 2.0) w/Device KIT Use as directed  ONE TOUCH DX E11.22 (Patient not taking: Reported on 05/17/2022)     OneTouch Delica Lancets 02R MISC  (Patient not taking: Reported on 09/25/2022)     ONETOUCH VERIO test strip 3 (three) times daily. (Patient not taking: Reported on 09/25/2022)     pantoprazole (PROTONIX) 40 MG tablet Take 1 tablet by mouth daily. (Patient not taking: Reported on 09/25/2022)     traZODone (DESYREL) 50 MG tablet Take 50 mg by mouth at bedtime. (Patient not taking: Reported on 09/25/2022)     No current facility-administered medications for this visit.    Allergies:   Metoprolol, Diltiazem, and Levofloxacin   Social History:  The patient  reports that she quit smoking about 33 years ago. Her smoking use included cigarettes. She has a 17.00 pack-year smoking history. She has never used smokeless tobacco. She  reports that she does not drink alcohol and does not use drugs.   Family History:   family history includes Allergies in her father; Breast cancer in her maternal aunt; Heart disease in her father; Hypertension in her sister; Liver cancer in her mother; Pancreatic cancer in her mother; Rheum arthritis in her father and sister.   Review of Systems: Review of Systems  Constitutional: Negative.   HENT: Negative.    Respiratory: Negative.    Cardiovascular:  Positive for leg swelling.  Gastrointestinal: Negative.   Musculoskeletal:  Positive for joint pain.  Neurological: Negative.   Psychiatric/Behavioral: Negative.    All other systems reviewed and are negative.   PHYSICAL EXAM: VS:  BP 120/60 (BP Location: Left Arm, Patient Position: Sitting, Cuff Size: Large)   Pulse 66   Ht '5\' 4"'  (1.626 m)   Wt 245 lb 6 oz (111.3 kg)   SpO2 98%   BMI 42.12 kg/m  , BMI Body mass index is 42.12 kg/m. Constitutional:  oriented to person, place, and time. No distress. obese HENT:  Head: Grossly normal Eyes:  no discharge. No scleral icterus.  Neck: No JVD, no carotid bruits  Cardiovascular: Regular rate and  rhythm, no murmurs appreciated Pulmonary/Chest: Clear to auscultation bilaterally, no wheezes or rails Abdominal: Soft.  no distension.  no tenderness.  Musculoskeletal: Normal range of motion Neurological:  normal muscle tone. Coordination normal. No atrophy Skin: Skin warm and dry Psychiatric: normal affect, pleasant   Recent Labs: 12/20/2021: ALT 24; BUN 31; Creatinine, Ser 2.78; Potassium 4.2; Sodium 138 06/29/2022: Hemoglobin 10.3; Platelets 145    Lipid Panel Lab Results  Component Value Date   CHOL 156 11/01/2016   HDL 58 11/01/2016   LDLCALC 79 11/01/2016   TRIG 93 11/01/2016      Wt Readings from Last 3 Encounters:  09/25/22 245 lb 6 oz (111.3 kg)  06/29/22 249 lb (112.9 kg)  05/17/22 251 lb 6.4 oz (114 kg)     ASSESSMENT AND PLAN:  Paroxysmal atrial fibrillation (HCC) -  Continue amiodarone 200 daily with carvedilol On warfarin, monitored through PMD So valence lab work, TSH in normal range  Sick sinus syndrome, pacer Followed by EP, pacer working well  Hyperlipidemia, unspecified hyperlipidemia type On statin, lipitior 20 daily  Acute on chronic diastolic CHF (congestive heart failure) (HCC) euvolemic , Creatinine elevated but stable Denies significant leg swelling or shortness of breath  Controlled type 2 diabetes mellitus without complication, without long-term current use of insulin (HCC) A1C 7.0 improved  DYSPNEA History of morbid obesity, chronic diastolic CHF on Lasix daily Goes [facility] [employer]   anemia Previously noted to be a chronic issue, previous iron infusion Hemoglobin stable   Total encounter time more than 30 minutes  Greater than 50% was spent in counseling and coordination of care with the patient   Orders Placed This Encounter  Procedures   EKG 12-Lead     Signed, Esmond Plants, M.D., Ph.D. 09/25/2022  [place], [place]

## 2022-09-25 ENCOUNTER — Ambulatory Visit: Payer: HMO | Attending: Cardiovascular Disease | Admitting: Cardiovascular Disease

## 2022-09-25 ENCOUNTER — Encounter: Payer: Self-pay | Admitting: Cardiovascular Disease

## 2022-09-25 VITALS — BP 120/60 | HR 66 | Ht 64.0 in | Wt 245.4 lb

## 2022-09-25 DIAGNOSIS — E782 Mixed hyperlipidemia: Secondary | ICD-10-CM

## 2022-09-25 DIAGNOSIS — I480 Paroxysmal atrial fibrillation: Secondary | ICD-10-CM

## 2022-09-25 DIAGNOSIS — Z950 Presence of cardiac pacemaker: Secondary | ICD-10-CM | POA: Diagnosis not present

## 2022-09-25 DIAGNOSIS — I10 Essential (primary) hypertension: Secondary | ICD-10-CM

## 2022-09-25 DIAGNOSIS — Z79899 Other long term (current) drug therapy: Secondary | ICD-10-CM

## 2022-09-25 DIAGNOSIS — I495 Sick sinus syndrome: Secondary | ICD-10-CM

## 2022-09-25 DIAGNOSIS — I5032 Chronic diastolic (congestive) heart failure: Secondary | ICD-10-CM

## 2022-09-25 NOTE — Patient Instructions (Signed)
Medication Instructions:  No changes  If you need a refill on your cardiac medications before your next appointment, please call your pharmacy.   Lab work: No new labs needed  Testing/Procedures: No new testing needed  Follow-Up: At [facility], you and your health needs are our priority.  As part of our continuing mission to provide you with exceptional heart care, we have created designated Provider Care Teams.  These Care Teams include your primary Cardiologist (physician) and Advanced Practice Providers (APPs -  Physician Assistants and Nurse Practitioners) who all work together to provide you with the care you need, when you need it.  You will need a follow up appointment in 12 months  Providers on your designated Care Team:   Christopher Berge, NP Ryan Dunn, PA-C Cadence Furth, PA-C  COVID-19 Vaccine Information can be found at: [email or web address] For questions related to vaccine distribution or appointments, please email [email or web address] or call [phone].   

## 2022-10-01 ENCOUNTER — Other Ambulatory Visit: Payer: Self-pay | Admitting: *Deleted

## 2022-10-01 DIAGNOSIS — N1831 Chronic kidney disease, stage 3a: Secondary | ICD-10-CM

## 2022-10-01 DIAGNOSIS — D509 Iron deficiency anemia, unspecified: Secondary | ICD-10-CM

## 2022-10-02 ENCOUNTER — Ambulatory Visit: Payer: PPO

## 2022-10-02 ENCOUNTER — Inpatient Hospital Stay: Payer: HMO

## 2022-10-02 ENCOUNTER — Other Ambulatory Visit: Payer: Self-pay

## 2022-10-02 ENCOUNTER — Other Ambulatory Visit: Payer: PPO

## 2022-10-03 ENCOUNTER — Inpatient Hospital Stay: Payer: HMO | Attending: Oncology

## 2022-10-03 ENCOUNTER — Inpatient Hospital Stay: Payer: HMO

## 2022-10-19 LAB — HM DIABETES EYE EXAM

## 2022-12-07 DIAGNOSIS — Z7901 Long term (current) use of anticoagulants: Secondary | ICD-10-CM | POA: Diagnosis not present

## 2022-12-12 DIAGNOSIS — H353220 Exudative age-related macular degeneration, left eye, stage unspecified: Secondary | ICD-10-CM | POA: Diagnosis not present

## 2022-12-19 DIAGNOSIS — I480 Paroxysmal atrial fibrillation: Secondary | ICD-10-CM | POA: Diagnosis not present

## 2022-12-27 ENCOUNTER — Other Ambulatory Visit: Payer: Self-pay

## 2022-12-29 ENCOUNTER — Ambulatory Visit: Payer: PPO | Admitting: Oncology

## 2022-12-29 ENCOUNTER — Inpatient Hospital Stay (HOSPITAL_BASED_OUTPATIENT_CLINIC_OR_DEPARTMENT_OTHER): Payer: HMO | Admitting: Medical Oncology

## 2022-12-29 ENCOUNTER — Encounter: Payer: Self-pay | Admitting: Medical Oncology

## 2022-12-29 ENCOUNTER — Inpatient Hospital Stay: Payer: HMO | Attending: Oncology

## 2022-12-29 ENCOUNTER — Other Ambulatory Visit: Payer: PPO

## 2022-12-29 ENCOUNTER — Ambulatory Visit: Payer: PPO

## 2022-12-29 ENCOUNTER — Inpatient Hospital Stay: Payer: HMO

## 2022-12-29 VITALS — BP 145/65 | HR 62 | Temp 96.3°F | Resp 17 | Wt 247.0 lb

## 2022-12-29 DIAGNOSIS — N184 Chronic kidney disease, stage 4 (severe): Secondary | ICD-10-CM

## 2022-12-29 DIAGNOSIS — D631 Anemia in chronic kidney disease: Secondary | ICD-10-CM | POA: Diagnosis not present

## 2022-12-29 DIAGNOSIS — N1831 Chronic kidney disease, stage 3a: Secondary | ICD-10-CM

## 2022-12-29 DIAGNOSIS — D649 Anemia, unspecified: Secondary | ICD-10-CM | POA: Diagnosis not present

## 2022-12-29 DIAGNOSIS — Z79899 Other long term (current) drug therapy: Secondary | ICD-10-CM | POA: Diagnosis not present

## 2022-12-29 DIAGNOSIS — D509 Iron deficiency anemia, unspecified: Secondary | ICD-10-CM

## 2022-12-29 LAB — CBC
HCT: 32.3 % — ABNORMAL LOW (ref 36.0–46.0)
Hemoglobin: 10.9 g/dL — ABNORMAL LOW (ref 12.0–15.0)
MCH: 26.6 pg (ref 26.0–34.0)
MCHC: 33.7 g/dL (ref 30.0–36.0)
MCV: 78.8 fL — ABNORMAL LOW (ref 80.0–100.0)
Platelets: 139 10*3/uL — ABNORMAL LOW (ref 150–400)
RBC: 4.1 MIL/uL (ref 3.87–5.11)
RDW: 14.4 % (ref 11.5–15.5)
WBC: 6.7 10*3/uL (ref 4.0–10.5)
nRBC: 0 % (ref 0.0–0.2)

## 2022-12-29 LAB — IRON AND TIBC
Iron: 72 ug/dL (ref 28–170)
Saturation Ratios: 26 % (ref 10.4–31.8)
TIBC: 280 ug/dL (ref 250–450)
UIBC: 208 ug/dL

## 2022-12-29 LAB — FERRITIN: Ferritin: 153 ng/mL (ref 11–307)

## 2022-12-29 NOTE — Progress Notes (Signed)
Patient here for oncology follow-up appointment,  concerns of occasional SOB w/ exertion

## 2022-12-29 NOTE — Progress Notes (Signed)
No injection today per PA. Discharged, stable

## 2022-12-29 NOTE — Progress Notes (Signed)
Hematology/Oncology Consult note [facility]  Telephone:([phone] [phone]  Patient Care Team: Kirk Ruths, MD as PCP - General (Internal Medicine) Vickie Epley, MD as PCP - Electrophysiology (Cardiology) Minna Merritts, MD as Consulting Physician (Cardiology) Anthonette Legato, MD as Consulting Physician (Nephrology) Flythe Lance, MD as Consulting Physician (Cardiology) Lorelee Cover., MD (Ophthalmology) Edrick Kins, DPM as Consulting Physician (Podiatry) Sindy Guadeloupe, MD as Consulting Physician (Hematology and Oncology)   Name of the patient: [name] [name]  [id number]  26-Nov-1935   Date of visit: 12/29/22  Diagnosis- chronic anemia multifactorial secondary to anemia of chronic kidney disease and a component of iron deficiency  Treatment:  Retacrit 40,000 for hemoglobin < 10 (last dose 06/29/2022) IV Iron-Feraheme- PRN (Last treatment was on 09/14/2017)  Chief complaint/ Reason for visit-routine follow-up of anemia of chronic kidney disease to receive Retacrit  Heme/Onc history:  Patient is a 88 year old female with history of anemia of chronic kidney disease for which she is on Retacrit.  She also has hemoglobin C trait.  She has received IV iron in the past as well.  Hemoglobin stable between 10-11 on Retacrit  Interval history- Patient reports that she is "doing pretty well". She is eating/drinking well. She is not having any excessive SOB, fatigue. No chest pains or peripheral edema of concern. She continues to be followed by Dr. Holley Raring for her CKD which she reports is stable and doing well. She is not on dialysis- she states that she is not interested in this. She is staying well hydrated to help with her CKD. She denies any bleeding or bruising episodes. No bowel changes.   ECOG PS- 1 Pain scale- 0   Review of systems- Review of Systems  Constitutional:  Negative for chills, fever, malaise/fatigue and weight  loss.  HENT:  Negative for congestion, ear discharge and nosebleeds.   Eyes:  Negative for blurred vision.  Respiratory:  Negative for cough, hemoptysis, sputum production, shortness of breath and wheezing.   Cardiovascular:  Negative for chest pain, palpitations, orthopnea and claudication.  Gastrointestinal:  Negative for abdominal pain, blood in stool, constipation, diarrhea, heartburn, melena, nausea and vomiting.  Genitourinary:  Negative for dysuria, flank pain, frequency, hematuria and urgency.  Musculoskeletal:  Negative for back pain, joint pain and myalgias.  Skin:  Negative for rash.  Neurological:  Negative for dizziness, tingling, focal weakness, seizures, weakness and headaches.  Endo/Heme/Allergies:  Does not bruise/bleed easily.  Psychiatric/Behavioral:  Negative for depression and suicidal ideas. The patient does not have insomnia.       Allergies  Allergen Reactions   Metoprolol Other (See Comments)    Bad dreams Patient stated that she had suicidal thoughts while taking this medication.    Diltiazem Other (See Comments)    Patient stated that she had bad dreams with this medication   Levofloxacin Nausea And Vomiting and Nausea Only          Past Medical History:  Diagnosis Date   1st degree AV block    a. PR 400 msec with Apacing   A-fib ([place])    Hx of   Anemia    Anxiety    Arthritis    knees and lower back    Chest pain    a. 2004 Cath:  reportedly nl;  b. 09/2013 Neg MV.   Chronic diastolic heart failure ([place])    a. 09/2013 Echo: EF 50-55%, mild LVH, mild to mod MR/TR.   Chronic  kidney disease    Stage IV   Congestive heart disease (HCC)    Esophageal reflux    Gout    H/O hiatal hernia    History of UTI    Hyperlipidemia    Morbid obesity ([place])    OSA on CPAP    CPAP   Pacemaker    copy of medtronic card in chart   PAF (paroxysmal atrial fibrillation) (HCC)    Seasonal allergies    SSS (sick sinus syndrome) ([place])    a. 2008 s/p MDT PPM;   b. 11/2013 Gen change- MDT ADDRL1 Adapta DC PPM, ser # [id number] H.   Symptomatic PVCs    Syncope and collapse    a. Felt to be vasovagal.   Thyroid disease    nodules on thyroid, with goiter, on no meds per pt   Type II diabetes mellitus ([place])    Type 2   Unspecified essential hypertension    controlled on meds   Unspecified glaucoma(365.9)    Vertigo    in past   Wears dentures    upper full and lower partial     Past Surgical History:  Procedure Laterality Date   APPENDECTOMY     CARDIAC CATHETERIZATION     CARDIOVERSION N/A 04/26/2017   Procedure: Cardioversion;  Surgeon: Minna Merritts, MD;  Location: [facility] ORS;  Service: Cardiovascular;  Laterality: N/A;   CATARACT EXTRACTION W/PHACO Left 07/21/2020   Procedure: CATARACT EXTRACTION PHACO AND INTRAOCULAR LENS PLACEMENT ([place]) LEFT DIABETIC;  Surgeon: Leandrew Koyanagi, MD;  Location: [place];  Service: Ophthalmology;  Laterality: Left;  10.25 1:13.2 14.0%   CATARACT EXTRACTION W/PHACO Right 08/11/2020   Procedure: CATARACT EXTRACTION PHACO AND INTRAOCULAR LENS PLACEMENT ([place]) RIGHT DIABETIC;  Surgeon: Leandrew Koyanagi, MD;  Location: [place];  Service: Ophthalmology;  Laterality: Right;  11.63 1:25.0 13.7%   DILATION AND CURETTAGE OF UTERUS     INCISION AND DRAINAGE OF WOUND Right 03/1998   "leg; it was like a boil" (12/01/2013)   INSERT / REPLACE / REMOVE PACEMAKER  2008; 12/01/2013   MDT ADDRL1 pacemaker - gen change by Dr Caryl Comes 12/01/2013   LUNG BIOPSY  2010   unc   PERMANENT PACEMAKER GENERATOR CHANGE N/A 12/01/2013   Procedure: PERMANENT PACEMAKER GENERATOR CHANGE;  Surgeon: Deboraha Sprang, MD;  Location[facility] CATH LAB;  Service: Cardiovascular;  Laterality: N/A;   VAGINAL HYSTERECTOMY  1970    Social History   Socioeconomic History   Marital status: Widowed    Spouse name: Not on file   Number of children: Y   Years of education: Not on file   Highest education level: Not on file   Occupational History   Occupation: retired  Tobacco Use   Smoking status: Former    Packs/day: 0.50    Years: 34.00    Total pack years: 17.00    Types: Cigarettes    Quit date: 08/15/1989    Years since quitting: 33.3   Smokeless tobacco: Never  Vaping Use   Vaping Use: Never used  Substance and Sexual Activity   Alcohol use: No    Alcohol/week: 0.0 standard drinks of alcohol   Drug use: No   Sexual activity: Never  Other Topics Concern   Not on file  Social History Narrative   Retired. Widowed. Regularly exercises.    Social Determinants of Health   Financial Resource Strain: Unknown (12/01/2018)   Overall Financial Resource Strain (CARDIA)    Difficulty of Paying Living  Expenses: Patient refused  Food Insecurity: Unknown (12/01/2018)   Hunger Vital Sign    Worried About [place] in the Last Year: Patient refused    [place] in the Last Year: Patient refused  Transportation Needs: Unknown (12/01/2018)   PRAPARE - Transportation    Lack of Transportation (Medical): Patient refused    Lack of Transportation (Non-Medical): Patient refused  Physical Activity: Unknown (12/01/2018)   Exercise Vital Sign    Days of Exercise per Week: Patient refused    Minutes of Exercise per Session: Patient refused  Stress: Unknown (12/01/2018)   [place]    Feeling of Stress : Patient refused  Social Connections: Unknown (12/01/2018)   Social Connection and Isolation Panel [NHANES]    Frequency of Communication with Friends and Family: Patient refused    Frequency of Social Gatherings with Friends and Family: Patient refused    Attends Religious Services: Patient refused    Active Member of Clubs or Organizations: Patient refused    Attends Archivist Meetings: Patient refused    Marital Status: Patient refused  Intimate Partner Violence: Unknown (12/01/2018)   Humiliation, Afraid, Rape,  and Kick questionnaire    Fear of Current or Ex-Partner: Patient refused    Emotionally Abused: Patient refused    Physically Abused: Patient refused    Sexually Abused: Patient refused    Family History  Problem Relation Age of Onset   Heart disease Father    Rheum arthritis Father    Allergies Father    Liver cancer Mother    Pancreatic cancer Mother    Hypertension Sister    Rheum arthritis Sister    Breast cancer Maternal Aunt      Current Outpatient Medications:    acetaminophen (TYLENOL) 500 MG tablet, Take 1,000 mg by mouth every 6 (six) hours as needed for moderate pain. , Disp: , Rfl:    allopurinol (ZYLOPRIM) 100 MG tablet, Take 1 tablet by mouth daily., Disp: , Rfl:    amiodarone (PACERONE) 200 MG tablet, Take 1 tablet (200 mg total) by mouth daily., Disp: 90 tablet, Rfl: 3   amLODipine (NORVASC) 5 MG tablet, Take 5 mg by mouth daily., Disp: , Rfl:    atorvastatin (LIPITOR) 20 MG tablet, TAKE 1 TABLET (20 MG TOTAL) BY MOUTH DAILY., Disp: 90 tablet, Rfl: 0   Blood Glucose Monitoring Suppl (FIFTY50 GLUCOSE METER 2.0) w/Device KIT, Use as directed ONE TOUCH DX E11.22 (Patient not taking: Reported on 05/17/2022), Disp: , Rfl:    calcitRIOL (ROCALTROL) 0.25 MCG capsule, TAKE 1 CAPSULE (0.25 MCG TOTAL) BY MOUTH 1 (ONE) TIME EACH DAY, Disp: , Rfl:    carvedilol (COREG) 25 MG tablet, Take 25-50 mg by mouth See admin instructions. Take 1 tablet ('25MG'$ ) by mouth every morning and 2 tablets ('50MG'$ ) by mouth every night before bed, Disp: , Rfl:    ferrous sulfate 325 (65 FE) MG EC tablet, Take 325 mg by mouth daily. , Disp: , Rfl:    furosemide (LASIX) 20 MG tablet, Take 40 mg by mouth daily., Disp: , Rfl:    glipiZIDE (GLUCOTROL) 10 MG tablet, TAKE 1 TABLET (10 MG TOTAL) BY MOUTH 2 (TWO) TIMES DAILY BEFORE A MEAL., Disp: 180 tablet, Rfl: 0   Glycerin, Laxative, (FLEET LIQUID GLYCERIN SUPP RE), Place 1 Dose rectally daily as needed (constipation)., Disp: , Rfl:    KLOR-CON M10 10 MEQ  tablet, Take 1 tablet by mouth  daily., Disp: , Rfl: 3   meclizine (ANTIVERT) 25 MG tablet, Take 1 tablet by mouth 3 (three) times daily as needed., Disp: , Rfl:    mometasone (ELOCON) 0.1 % cream, Apply topically daily. Apply to affected area as directed two times a day, Disp: 45 g, Rfl: 0   Multiple Vitamins-Minerals (CENTRUM SILVER PO), Take 1 tablet by mouth daily. , Disp: , Rfl:    olmesartan (BENICAR) 40 MG tablet, Take 1 tablet by mouth daily., Disp: , Rfl:    OneTouch Delica Lancets 36R MISC, , Disp: , Rfl:    ONETOUCH VERIO test strip, 3 (three) times daily. (Patient not taking: Reported on 09/25/2022), Disp: , Rfl:    pantoprazole (PROTONIX) 40 MG tablet, Take 1 tablet by mouth daily. (Patient not taking: Reported on 09/25/2022), Disp: , Rfl:    polyethylene glycol (MIRALAX / GLYCOLAX) packet, Take 17 g by mouth daily as needed for moderate constipation., Disp: , Rfl:    simethicone (MYLICON) 40 WE/3.1VQ drops, Take by mouth., Disp: , Rfl:    sitaGLIPtin (JANUVIA) 25 MG tablet, Take by mouth., Disp: , Rfl:    traZODone (DESYREL) 50 MG tablet, Take 50 mg by mouth at bedtime. (Patient not taking: Reported on 09/25/2022), Disp: , Rfl:    warfarin (COUMADIN) 1 MG tablet, Take 1 mg by mouth as directed., Disp: , Rfl:    warfarin (COUMADIN) 3 MG tablet, TAKE 1-1.5 TABLETS DAILY AT 6 PM. TAKE 1 TAB ON TUES, THURS, SAT, SUN. TAKE 4.5 MG ON MON, WED, FRI, Disp: 100 tablet, Rfl: 3  Physical exam:  There were no vitals filed for this visit.  Physical Exam Constitutional:      General: She is not in acute distress. Cardiovascular:     Rate and Rhythm: Normal rate and regular rhythm.     Heart sounds: Normal heart sounds.  Pulmonary:     Effort: Pulmonary effort is normal.     Breath sounds: Normal breath sounds.  Abdominal:     General: Bowel sounds are normal.     Palpations: Abdomen is soft.  Skin:    General: Skin is warm and dry.  Neurological:     Mental Status: She is alert and  oriented to person, place, and time.        Latest Ref Rng & Units 12/20/2021   10:38 AM  CMP  Glucose 70 - 99 mg/dL 154   BUN 8 - 23 mg/dL 31   Creatinine 0.44 - 1.00 mg/dL 2.78   Sodium 135 - 145 mmol/L 138   Potassium 3.5 - 5.1 mmol/L 4.2   Chloride 98 - 111 mmol/L 102   CO2 22 - 32 mmol/L 26   Calcium 8.9 - 10.3 mg/dL 9.5   Total Protein 6.5 - 8.1 g/dL 6.7   Total Bilirubin 0.3 - 1.2 mg/dL 0.8   Alkaline Phos 38 - 126 U/L 72   AST 15 - 41 U/L 25   ALT 0 - 44 U/L 24       Latest Ref Rng & Units 12/29/2022    9:42 AM  CBC  WBC 4.0 - 10.5 K/uL 6.7   Hemoglobin 12.0 - 15.0 g/dL 10.9   Hematocrit 36.0 - 46.0 % 32.3   Platelets 150 - 400 K/uL 139     Assessment and plan- Patient is a 88 y.o. female with anemia of chronic kidney disease on Retacrit here for routine follow-up  Anemia is chronic but stable and well controlled. She has not  required any Retacrit since October 2022. Today her hemoglobin is 10.9 and therefore she does not require any retacrit. She does have chronic mild microcytosis however last iron studies were WNL.   Visit Diagnosis 1. Anemia due to chronic renal failure treated with erythropoietin, stage 4 (severe) ([place])   2. Anemia of chronic renal failure, stage 3a ([place])   3. Erythropoietin (EPO) stimulating agent anemia management patient    Disposition: 3 mo- lab (H&), +/- retacrit 6 mo- lab (cbc, ferritin, iron studies), Dr. Janese Banks, +/- retacrit- [place] 12/29/2022 9:59 AM

## 2023-01-11 DIAGNOSIS — E042 Nontoxic multinodular goiter: Secondary | ICD-10-CM | POA: Diagnosis not present

## 2023-01-15 DIAGNOSIS — H401132 Primary open-angle glaucoma, bilateral, moderate stage: Secondary | ICD-10-CM | POA: Diagnosis not present

## 2023-01-15 DIAGNOSIS — H353221 Exudative age-related macular degeneration, left eye, with active choroidal neovascularization: Secondary | ICD-10-CM | POA: Diagnosis not present

## 2023-01-15 DIAGNOSIS — H35361 Drusen (degenerative) of macula, right eye: Secondary | ICD-10-CM | POA: Diagnosis not present

## 2023-01-15 DIAGNOSIS — E113293 Type 2 diabetes mellitus with mild nonproliferative diabetic retinopathy without macular edema, bilateral: Secondary | ICD-10-CM | POA: Diagnosis not present

## 2023-01-15 DIAGNOSIS — H43823 Vitreomacular adhesion, bilateral: Secondary | ICD-10-CM | POA: Diagnosis not present

## 2023-01-15 DIAGNOSIS — H35033 Hypertensive retinopathy, bilateral: Secondary | ICD-10-CM | POA: Diagnosis not present

## 2023-01-18 DIAGNOSIS — E042 Nontoxic multinodular goiter: Secondary | ICD-10-CM | POA: Diagnosis not present

## 2023-01-18 DIAGNOSIS — Z7901 Long term (current) use of anticoagulants: Secondary | ICD-10-CM | POA: Diagnosis not present

## 2023-01-18 DIAGNOSIS — N184 Chronic kidney disease, stage 4 (severe): Secondary | ICD-10-CM | POA: Diagnosis not present

## 2023-01-18 DIAGNOSIS — E1122 Type 2 diabetes mellitus with diabetic chronic kidney disease: Secondary | ICD-10-CM | POA: Diagnosis not present

## 2023-01-18 DIAGNOSIS — I480 Paroxysmal atrial fibrillation: Secondary | ICD-10-CM | POA: Diagnosis not present

## 2023-02-08 DIAGNOSIS — Z7901 Long term (current) use of anticoagulants: Secondary | ICD-10-CM | POA: Diagnosis not present

## 2023-02-16 ENCOUNTER — Telehealth: Payer: Self-pay | Admitting: Cardiovascular Disease

## 2023-02-16 NOTE — Telephone Encounter (Signed)
*  STAT* If patient is at the pharmacy, call can be transferred to refill team.   1. Which medications need to be refilled? (please list name of each medication and dose if known)  carvedilol (COREG) 25 MG tablet   2. Which pharmacy/location (including street and city if local pharmacy) is medication to be sent to?  [id number] - [address], [place]    3. Do they need a 30 day or 90 day supply? 90 day

## 2023-02-16 NOTE — Telephone Encounter (Signed)
Look like this was starting in the hospital.  I do not see where it was noted in her last OV with you.  Can you please verify if you would like for patient to continue taking 25mg  in the AM and 50 mg in the PM

## 2023-02-19 ENCOUNTER — Encounter: Payer: Self-pay | Admitting: Family Medicine

## 2023-02-19 ENCOUNTER — Encounter: Payer: Self-pay | Admitting: Oncology

## 2023-02-19 ENCOUNTER — Ambulatory Visit (INDEPENDENT_AMBULATORY_CARE_PROVIDER_SITE_OTHER): Payer: PPO

## 2023-02-19 ENCOUNTER — Ambulatory Visit: Payer: PPO | Admitting: Family Medicine

## 2023-02-19 VITALS — BP 136/74 | HR 66 | Ht 64.0 in | Wt 248.6 lb

## 2023-02-19 DIAGNOSIS — M25562 Pain in left knee: Secondary | ICD-10-CM

## 2023-02-19 DIAGNOSIS — M25561 Pain in right knee: Secondary | ICD-10-CM

## 2023-02-19 DIAGNOSIS — G8929 Other chronic pain: Secondary | ICD-10-CM

## 2023-02-19 DIAGNOSIS — I495 Sick sinus syndrome: Secondary | ICD-10-CM

## 2023-02-19 DIAGNOSIS — M1712 Unilateral primary osteoarthritis, left knee: Secondary | ICD-10-CM | POA: Diagnosis not present

## 2023-02-19 DIAGNOSIS — M170 Bilateral primary osteoarthritis of knee: Secondary | ICD-10-CM | POA: Diagnosis not present

## 2023-02-19 DIAGNOSIS — M1711 Unilateral primary osteoarthritis, right knee: Secondary | ICD-10-CM | POA: Diagnosis not present

## 2023-02-19 MED ORDER — CARVEDILOL 25 MG PO TABS: 25.0000 mg | ORAL_TABLET | ORAL | 3 refills | Status: DC

## 2023-02-19 NOTE — Progress Notes (Unsigned)
   I, [name] [name], CMA acting as a Education administrator for [name], MD.  Subjective:    CC: R knee pain  HPI: Pt is an 88 y/o female c/o BILAT knee pain, chronic. Pt locates pain to the medial aspect of the knee. Has concerns about the knees shifting inward. Pain is intermittent. Knees get stiff after sitting for prolonged period of time. Swelling and mechanical sx present, knees feel unsteady.   Knee swelling: yes Mechanical symptoms: yes Radiates: no Aggravates: sit-to-stand, prolonged time sitting Treatments tried: compression socks  Pertinent review of Systems: ***  Relevant historical information: ***   Objective:   There were no vitals filed for this visit. General: Well Developed, well nourished, and in no acute distress.   MSK: ***  Lab and Radiology Results No results found for this or any previous visit (from the past 72 hour(s)). No results found.    Impression and Recommendations:    Assessment and Plan: 88 y.o. female with ***.  PDMP not reviewed this encounter. No orders of the defined types were placed in this encounter.  No orders of the defined types were placed in this encounter.   Discussed warning signs or symptoms. Please see discharge instructions. Patient expresses understanding.   ***

## 2023-02-19 NOTE — Patient Instructions (Signed)
Thank you for coming in today.   Please get an Xray today before you leave \  You should hear about that brace soon.   Let me know about the injection any time.

## 2023-02-19 NOTE — Telephone Encounter (Signed)
Spoke with patient and reviewed providers recommendations.    Minna Merritts, MD  Sent: Sun February 18, 2023  3:55 PM  To: [facility] T[facility]         Message  We can send in the carvedilol  25mg  in the AM and 50 mg in the PM  She was taking this on last clinic visit with me October 2023  Thx  TG

## 2023-02-20 LAB — CUP PACEART REMOTE DEVICE CHECK
Battery Impedance: 1158 Ohm
Battery Remaining Longevity: 55 mo
Battery Voltage: 2.77 V
Brady Statistic AP VP Percent: 5 %
Brady Statistic AP VS Percent: 93 %
Brady Statistic AS VP Percent: 0 %
Brady Statistic AS VS Percent: 2 %
Date Time Interrogation Session: 20240318090900
Implantable Lead Connection Status: 753985
Implantable Lead Connection Status: 753985
Implantable Lead Implant Date: 20080117
Implantable Lead Implant Date: 20080117
Implantable Lead Location: 753859
Implantable Lead Location: 753860
Implantable Lead Model: 5076
Implantable Lead Model: 5076
Implantable Pulse Generator Implant Date: 20141229
Lead Channel Impedance Value: 429 Ohm
Lead Channel Impedance Value: 671 Ohm
Lead Channel Pacing Threshold Amplitude: 0.625 V
Lead Channel Pacing Threshold Amplitude: 1 V
Lead Channel Pacing Threshold Pulse Width: 0.4 ms
Lead Channel Pacing Threshold Pulse Width: 0.4 ms
Lead Channel Setting Pacing Amplitude: 2 V
Lead Channel Setting Pacing Amplitude: 2.5 V
Lead Channel Setting Pacing Pulse Width: 0.4 ms
Lead Channel Setting Sensing Sensitivity: 4 mV
Zone Setting Status: 755011
Zone Setting Status: 755011

## 2023-02-21 NOTE — Progress Notes (Signed)
Left knee x-ray shows some arthritis at the outside part of the knee.

## 2023-02-21 NOTE — Progress Notes (Signed)
Right knee x-ray shows some arthritis in the knee.

## 2023-02-22 DIAGNOSIS — Z7901 Long term (current) use of anticoagulants: Secondary | ICD-10-CM | POA: Diagnosis not present

## 2023-03-03 DIAGNOSIS — J0110 Acute frontal sinusitis, unspecified: Secondary | ICD-10-CM | POA: Diagnosis not present

## 2023-03-03 DIAGNOSIS — Z03818 Encounter for observation for suspected exposure to other biological agents ruled out: Secondary | ICD-10-CM | POA: Diagnosis not present

## 2023-03-03 DIAGNOSIS — J441 Chronic obstructive pulmonary disease with (acute) exacerbation: Secondary | ICD-10-CM | POA: Diagnosis not present

## 2023-03-05 DIAGNOSIS — H353220 Exudative age-related macular degeneration, left eye, stage unspecified: Secondary | ICD-10-CM | POA: Diagnosis not present

## 2023-03-13 DIAGNOSIS — E1122 Type 2 diabetes mellitus with diabetic chronic kidney disease: Secondary | ICD-10-CM | POA: Diagnosis not present

## 2023-03-13 DIAGNOSIS — N184 Chronic kidney disease, stage 4 (severe): Secondary | ICD-10-CM | POA: Diagnosis not present

## 2023-03-15 DIAGNOSIS — G4733 Obstructive sleep apnea (adult) (pediatric): Secondary | ICD-10-CM | POA: Diagnosis not present

## 2023-03-15 DIAGNOSIS — Z7901 Long term (current) use of anticoagulants: Secondary | ICD-10-CM | POA: Diagnosis not present

## 2023-03-19 ENCOUNTER — Telehealth: Payer: Self-pay | Admitting: Cardiovascular Disease

## 2023-03-19 NOTE — Telephone Encounter (Signed)
I called and spoke with the patient and advised her that her PPM is functioning normally and no new episodes have been noted.   I have advised the patient that all cardiac signs/ symptoms appear stable. I have asked her to reach out to her PCP for further advisement and evaluation.  The patient voices understanding and is agreeable.

## 2023-03-19 NOTE — Telephone Encounter (Signed)
Pt c/o Shortness Of Breath: STAT if SOB developed within the last 24 hours or pt is noticeably SOB on the phone  1. Are you currently SOB (can you hear that pt is SOB on the phone)? Yes   2. How long have you been experiencing SOB? Couple weeks   3. Are you SOB when sitting or when up moving around? Both   4. Are you currently experiencing any other symptoms? Feel tired and when she walks she feelings like she is going to pass out/faint.

## 2023-03-19 NOTE — Telephone Encounter (Signed)
Call transferred directly to this RN from the Call Center. I spoke with the patient. She advised that she has had worsening SOB/ fatigue for the last 2 weeks.  She states that when she moves around at home, she feels like she might pass out.  She advised her weight has been stable. Lower extremity swelling his stable. BP has been running 130's/ 60-70's.  When she has felt as if she might pass out, her BP was ~ 159/70.    The patient also states she has been having trouble with allergies as well. She was recently on a course of antibiotics/ prednisone/ inhalers (finished last Tuesday).   She does have a PPM.  She had a generator change on 12/01/13.  The patient's last transmission was from 02/19/23. I inquired if symptoms started after this transmission and she advised they did worsen after this.   I have asked her to please send in another transmission today for review by our Device Clinic team. After her transmission is received, she is aware we will reach back out to her and advise if we need to set her up an appointment for cardiology or if she should follow up with her PCP.  The patient voices understanding and is agreeable. She is comfortable with sending a transmission and will do this now.

## 2023-03-19 NOTE — Telephone Encounter (Signed)
Normal device function. No events noted after 02/19/23 transmission. No events today or alerts to correlate with symptoms.

## 2023-03-20 DIAGNOSIS — D631 Anemia in chronic kidney disease: Secondary | ICD-10-CM | POA: Diagnosis not present

## 2023-03-20 DIAGNOSIS — R600 Localized edema: Secondary | ICD-10-CM | POA: Diagnosis not present

## 2023-03-20 DIAGNOSIS — K50919 Crohn's disease, unspecified, with unspecified complications: Secondary | ICD-10-CM | POA: Diagnosis not present

## 2023-03-20 DIAGNOSIS — I480 Paroxysmal atrial fibrillation: Secondary | ICD-10-CM | POA: Diagnosis not present

## 2023-03-20 DIAGNOSIS — Z950 Presence of cardiac pacemaker: Secondary | ICD-10-CM | POA: Diagnosis not present

## 2023-03-20 DIAGNOSIS — G4733 Obstructive sleep apnea (adult) (pediatric): Secondary | ICD-10-CM | POA: Diagnosis not present

## 2023-03-20 DIAGNOSIS — N2581 Secondary hyperparathyroidism of renal origin: Secondary | ICD-10-CM | POA: Diagnosis not present

## 2023-03-20 DIAGNOSIS — N184 Chronic kidney disease, stage 4 (severe): Secondary | ICD-10-CM | POA: Diagnosis not present

## 2023-03-20 DIAGNOSIS — I10 Essential (primary) hypertension: Secondary | ICD-10-CM | POA: Diagnosis not present

## 2023-03-20 DIAGNOSIS — K219 Gastro-esophageal reflux disease without esophagitis: Secondary | ICD-10-CM | POA: Diagnosis not present

## 2023-03-23 DIAGNOSIS — G4733 Obstructive sleep apnea (adult) (pediatric): Secondary | ICD-10-CM | POA: Diagnosis not present

## 2023-03-23 DIAGNOSIS — N184 Chronic kidney disease, stage 4 (severe): Secondary | ICD-10-CM | POA: Diagnosis not present

## 2023-03-23 DIAGNOSIS — D631 Anemia in chronic kidney disease: Secondary | ICD-10-CM | POA: Diagnosis not present

## 2023-03-23 DIAGNOSIS — N2581 Secondary hyperparathyroidism of renal origin: Secondary | ICD-10-CM | POA: Diagnosis not present

## 2023-03-23 DIAGNOSIS — I5032 Chronic diastolic (congestive) heart failure: Secondary | ICD-10-CM | POA: Diagnosis not present

## 2023-03-23 DIAGNOSIS — E1122 Type 2 diabetes mellitus with diabetic chronic kidney disease: Secondary | ICD-10-CM | POA: Diagnosis not present

## 2023-03-23 DIAGNOSIS — I4891 Unspecified atrial fibrillation: Secondary | ICD-10-CM | POA: Diagnosis not present

## 2023-03-27 ENCOUNTER — Telehealth: Payer: Self-pay | Admitting: Cardiovascular Disease

## 2023-03-27 DIAGNOSIS — M170 Bilateral primary osteoarthritis of knee: Secondary | ICD-10-CM | POA: Diagnosis not present

## 2023-03-27 NOTE — Telephone Encounter (Signed)
Pt called stating she's been experiencing SOB with exertion the past few months. She denies significant weight increase but report bilateral lower extremity edema. Pt also reported no energy that prohibits her from doing daily task such as cleaning and cooking. Pt also reported sometimes she will experiencing chest tightness with SOB. Denies symptoms currently  Appointment scheduled for tomorrow 03/28/23 for further evaluations.

## 2023-03-27 NOTE — Telephone Encounter (Signed)
Pt c/o Shortness Of Breath: STAT if SOB developed within the last 24 hours or pt is noticeably SOB on the phone  1. Are you currently SOB (can you hear that pt is SOB on the phone)? Yes, cannot hear over the phone  2. How long have you been experiencing SOB? Over a month   3. Are you SOB when sitting or when up moving around? Moving around  4. Are you currently experiencing any other symptoms? Fatigue   States she saw PCP and was prescribed an inhaler, but SOB has not improved. Patient is wanting to see Dr. Mariah Milling in regards to this. Please advise.

## 2023-03-27 NOTE — Telephone Encounter (Signed)
Patient is calling back because she have not receive a call back yet.

## 2023-03-28 ENCOUNTER — Other Ambulatory Visit
Admission: RE | Admit: 2023-03-28 | Discharge: 2023-03-28 | Disposition: A | Discharge status: Home / Self Care | Payer: PPO | Source: Ambulatory Visit | Attending: Cardiology | Admitting: Cardiology

## 2023-03-28 ENCOUNTER — Ambulatory Visit: Payer: PPO | Attending: Cardiology | Admitting: Cardiology

## 2023-03-28 ENCOUNTER — Encounter: Payer: Self-pay | Admitting: Cardiology

## 2023-03-28 VITALS — BP 118/60 | HR 60 | Ht 64.0 in | Wt 246.0 lb

## 2023-03-28 DIAGNOSIS — I495 Sick sinus syndrome: Secondary | ICD-10-CM

## 2023-03-28 DIAGNOSIS — Z950 Presence of cardiac pacemaker: Secondary | ICD-10-CM

## 2023-03-28 DIAGNOSIS — I480 Paroxysmal atrial fibrillation: Secondary | ICD-10-CM

## 2023-03-28 DIAGNOSIS — I4891 Unspecified atrial fibrillation: Secondary | ICD-10-CM | POA: Insufficient documentation

## 2023-03-28 DIAGNOSIS — I10 Essential (primary) hypertension: Secondary | ICD-10-CM

## 2023-03-28 DIAGNOSIS — I5032 Chronic diastolic (congestive) heart failure: Secondary | ICD-10-CM

## 2023-03-28 DIAGNOSIS — N183 Chronic kidney disease, stage 3 unspecified: Secondary | ICD-10-CM

## 2023-03-28 DIAGNOSIS — E119 Type 2 diabetes mellitus without complications: Secondary | ICD-10-CM

## 2023-03-28 DIAGNOSIS — R0602 Shortness of breath: Secondary | ICD-10-CM

## 2023-03-28 DIAGNOSIS — E782 Mixed hyperlipidemia: Secondary | ICD-10-CM

## 2023-03-28 DIAGNOSIS — I4892 Unspecified atrial flutter: Secondary | ICD-10-CM | POA: Insufficient documentation

## 2023-03-28 LAB — BRAIN NATRIURETIC PEPTIDE: B Natriuretic Peptide: 345 pg/mL — ABNORMAL HIGH (ref 0.0–100.0)

## 2023-03-28 NOTE — Progress Notes (Unsigned)
Cardiology Office Note:   Date:  03/29/2023  ID:  [name] [name], DOB 08-Jun-1935, MRN [id number]  History of Present Illness:   [name] [name] is a 88 y.o. female with past medical history of paroxysmal atrial fibrillation on chronic anticoagulation with warfarin, morbid obesity, symptomatic palpitations with PVCs, sick sinus syndrome/tachybradycardia syndrome with permanent pacemaker placement, chronic palpitations, chronic shortness of breath with exertion, chronic fatigue, obstructive sleep apnea on CPAP, chronic renal failure, who presents today with complaints of shortness of breath, chest tightness, and increased peripheral edema.  Previously had undergone Myoview stress testing in October 2020 which showed no significant ischemia with normal ejection fraction.  Last echocardiogram completed in May 2022 showed normal LVSF, moderate TR, mild to moderate MR, mild PR, EF 55 to 60%.  In clinic 10/53/23 by Dr. Mariah [name].  At that time she was doing well but continued to have her chronic complaints of shortness of breath, fatigue, peripheral edema and joint pain.  There were no changes made to her medication at that time appropriate no further testing that was needed.  Patient called the nurse triage line 60 mg.  Shortness of breath with exertion for the past few months, denies any significant weight increase but reported lower extremity edema, patient states she also had no energy and appetite preserved to do daily tasks such as cleaning and cooking.  She also reported that sometimes she was experiencing chest tightness with the shortness of breath but denied any symptoms currently during that time.  Patient reported that was scheduled for today.  She returns clinic today with complaints of shortness of breath, fatigue, and peripheral edema, without weight gain. She states that these symptoms have been ongoing to the about the last year and have gradually progressed. She has made modifications to her daily  activities like using a stool to sit and wash dishes and has placed a shower chair in the tub to be able to sit down during her shower because she becomes winded and tired during basic activity. She denies chest pain, chest pressure, or palpitations. Is asymptomatic to her atrial fibrillation. She is continued on Warfarin without issues of bleeding, or noted blood her urine or stool. Denies any hospitalizations or visits to the emergency department.  ROS: 10 point review of systems has been completed and is considered negative with the exception of what is listed in the HPI  Studies Reviewed:    EKG: Atrially paced at a rate of 60  TTE 12/18/18 Left ventricle: The cavity size was normal. Wall thickness was    increased in a pattern of mild LVH. Systolic function was normal.    The estimated ejection fraction was in the range of 55% to 65%.  - Mitral valve: There was mild regurgitation.  - Left atrium: The atrium was mildly dilated.   Risk Assessment/Calculations:    CHA2DS2-VASc Score = 6   This indicates a 9.7% annual risk of stroke. The patient's score is based upon: CHF History: 1 HTN History: 1 Diabetes History: 1 Stroke History: 0 Vascular Disease History: 0 Age Score: 2 Gender Score: 1             Physical Exam:   VS:  BP 118/60   Pulse 60   Ht  (1.626 m)   Wt 246 lb (111.6 kg)   SpO2 96%   BMI 42.23 kg/m    Wt Readings from Last 3 Encounters:  03/28/23 246 lb (111.6 kg)  02/19/23 248 lb 9.6 oz (  112.8 kg)  12/29/22 247 lb (112 kg)     GEN: Well nourished, well developed in no acute distress NECK: No JVD; No carotid bruits CARDIAC: RRR, no murmurs, rubs, gallops RESPIRATORY:  Clear to auscultation without rales, wheezing or rhonchi  ABDOMEN: Soft, obese, non-tender, non-distended EXTREMITIES: trace pretibial edema; No deformity   ASSESSMENT AND PLAN:   HFpEF with a last LVEF of 55 to 60% in 2020.  She arrives to clinic today complaining of worsening  progressive shortness of breath and dyspnea on exertion, peripheral edema, without weight gain.  She states has been ongoing for a year but is gradually gotten worse.  She has modified activities at home to help lessen the impact of her breathing but unfortunately it has gotten worse.  With the shortness of breath her PCP recently gave her an inhaler but she states that it has not helped.  She has been sent for a BMP today where her average runs a little over 600 and she has been scheduled for an echocardiogram to ensure her heart function has not changed as her last study was completed in 2020.  She is continued on furosemide 40 mg daily.  She has been keeping a log of her daily weights since 2013 and her weight has not changed.  She is not a candidate for SGLT 2 inhibitor or MRA due to serum creatinine of 2.6.  Paroxysmal atrial fibrillation which she is atrially paced on EKG today.  She is on Coumadin and has her INR checked by her PCP.  She denies any signs of bleeding and has not noted any blood in her stool or urine and has been on chronic anticoagulant for quite some time.  She is continued on amiodarone, carvedilol, and warfarin.  Hyperlipidemia was a last LDL of 96 she is continued on 20 mg of atorvastatin.  This continues to be monitored by her PCP.  Type 2 diabetes which she is on glipizide and Januvia.  This continues to be monitored by her PCP.  Essential hypertension blood pressure today of 118/60.  She is continued on amlodipine 5 mg daily and carvedilol 25 mg in the a.m. and 50 mg in the p.m.  She has been encouraged to continue to monitor blood pressures at home as well.  Chronic kidney disease with a serum creatinine of 2.6.  Kidney function has remained stable on current medication regimen. She continues to be followed by Nephrology.  Cardiac pacemaker in situ with a history of sick sinus syndrome and tachybradycardia syndrome.  She is atrially paced today on EKG.  She has been  encouraged to continue to maintain her follow-ups with the EP.  Disposition patient to return to clinic to see MD/APP once echocardiogram is completed to reevaluate symptoms and any changes needed        Signed, Marvel Sapp, NP

## 2023-03-28 NOTE — Patient Instructions (Signed)
Medication Instructions:  Your physician recommends that you continue on your current medications as directed. Please refer to the Current Medication list given to you today.  *If you need a refill on your cardiac medications before your next appointment, please call your pharmacy*   Lab Work: Your physician recommends that you get lab work at [employer]: BNP order printed and given to patient If you have labs (blood work) drawn today and your tests are completely normal, you will receive your results only by: MyChart Message (if you have MyChart) OR A paper copy in the mail If you have any lab test that is abnormal or we need to change your treatment, we will call you to review the results.   Testing/Procedures: Your physician has requested that you have an echocardiogram. Echocardiography is a painless test that uses sound waves to create images of your heart. It provides your doctor with information about the size and shape of your heart and how well your heart's chambers and valves are working. This procedure takes approximately one hour. There are no restrictions for this procedure. Please do NOT wear cologne, perfume, aftershave, or lotions (deodorant is allowed). Please arrive 15 minutes prior to your appointment time.    Follow-Up: At [facility], you and your health needs are our priority.  As part of our continuing mission to provide you with exceptional heart care, we have created designated Provider Care Teams.  These Care Teams include your primary Cardiologist (physician) and Advanced Practice Providers (APPs -  Physician Assistants and Nurse Practitioners) who all work together to provide you with the care you need, when you need it.  We recommend signing up for the patient portal called "MyChart".  Sign up information is provided on this After Visit Summary.  MyChart is used to connect with patients for Virtual Visits (Telemedicine).  Patients are able to view  lab/test results, encounter notes, upcoming appointments, etc.  Non-urgent messages can be sent to your provider as well.   To learn more about what you can do with MyChart, go to [email or web address].    Your next appointment:   3 month(s)  Provider:   You may see [name], MD or one of the following Advanced Practice Providers on your designated Care Team:   Nicolasa Ducking, NP Eula Listen, PA-C Cadence Fransico Michael, PA-C Charlsie Quest, NP    Other Instructions -None

## 2023-03-29 ENCOUNTER — Encounter: Payer: Self-pay | Admitting: Cardiology

## 2023-03-29 NOTE — Progress Notes (Signed)
BNP is considered normal for age. Recommend taking furosemide as prescribed, decrease sodium intake, and decrease fluid intake to 1500 mls per day, weight daily, and keep appointment for scheduled echocardiogram.

## 2023-03-30 ENCOUNTER — Inpatient Hospital Stay: Payer: PPO | Attending: Oncology

## 2023-03-30 ENCOUNTER — Inpatient Hospital Stay: Payer: PPO

## 2023-03-30 DIAGNOSIS — D631 Anemia in chronic kidney disease: Secondary | ICD-10-CM | POA: Diagnosis not present

## 2023-03-30 DIAGNOSIS — N184 Chronic kidney disease, stage 4 (severe): Secondary | ICD-10-CM | POA: Diagnosis not present

## 2023-03-30 DIAGNOSIS — N1831 Chronic kidney disease, stage 3a: Secondary | ICD-10-CM

## 2023-03-30 LAB — HEMOGLOBIN AND HEMATOCRIT, BLOOD
HCT: 30.8 % — ABNORMAL LOW (ref 36.0–46.0)
Hemoglobin: 10.4 g/dL — ABNORMAL LOW (ref 12.0–15.0)

## 2023-03-30 NOTE — Progress Notes (Signed)
Hgb 10.4 retacrit injection held today.

## 2023-03-30 NOTE — Progress Notes (Signed)
Remote pacemaker transmission.   

## 2023-04-05 ENCOUNTER — Telehealth: Payer: Self-pay | Admitting: *Deleted

## 2023-04-05 NOTE — Telephone Encounter (Signed)
-----   Message from Charlsie Quest, NP sent at 03/29/2023  8:37 AM EDT ----- BNP is considered normal for age. Recommend taking furosemide as prescribed, decrease sodium intake, and decrease fluid intake to 1500 mls per day, weight daily, and keep appointment for scheduled echocardiogram.

## 2023-04-05 NOTE — Telephone Encounter (Signed)
Left voicemail message to call back  

## 2023-04-06 NOTE — Telephone Encounter (Signed)
Follow Up:      Patient is calling back from yesterday. Please call her on her home phone number. [phone]

## 2023-04-06 NOTE — Telephone Encounter (Signed)
Spoke with patient and reviewed results and recommendations. She verbalized understanding with no further questions at this time. 

## 2023-04-10 DIAGNOSIS — L851 Acquired keratosis [keratoderma] palmaris et plantaris: Secondary | ICD-10-CM | POA: Diagnosis not present

## 2023-04-10 DIAGNOSIS — I4891 Unspecified atrial fibrillation: Secondary | ICD-10-CM | POA: Diagnosis not present

## 2023-04-10 DIAGNOSIS — B351 Tinea unguium: Secondary | ICD-10-CM | POA: Diagnosis not present

## 2023-04-10 DIAGNOSIS — E119 Type 2 diabetes mellitus without complications: Secondary | ICD-10-CM | POA: Diagnosis not present

## 2023-04-10 DIAGNOSIS — Z7901 Long term (current) use of anticoagulants: Secondary | ICD-10-CM | POA: Diagnosis not present

## 2023-04-25 ENCOUNTER — Ambulatory Visit: Payer: PPO | Attending: Cardiology

## 2023-04-25 DIAGNOSIS — I480 Paroxysmal atrial fibrillation: Secondary | ICD-10-CM | POA: Diagnosis not present

## 2023-04-25 DIAGNOSIS — I5032 Chronic diastolic (congestive) heart failure: Secondary | ICD-10-CM

## 2023-04-25 DIAGNOSIS — I4891 Unspecified atrial fibrillation: Secondary | ICD-10-CM | POA: Diagnosis not present

## 2023-04-25 LAB — ECHOCARDIOGRAM COMPLETE
AR max vel: 2.36 cm2
AV Area VTI: 2.4 cm2
AV Area mean vel: 2.34 cm2
AV Mean grad: 4 mmHg
AV Peak grad: 6.7 mmHg
Ao pk vel: 1.29 m/s
Area-P 1/2: 2.99 cm2
Calc EF: 49 %
S' Lateral: 4.1 cm
Single Plane A2C EF: 48.4 %
Single Plane A4C EF: 50.8 %

## 2023-05-03 ENCOUNTER — Telehealth: Payer: Self-pay | Admitting: Cardiovascular Disease

## 2023-05-03 NOTE — Telephone Encounter (Signed)
Patient is calling to get the results of her echo.

## 2023-05-03 NOTE — Telephone Encounter (Signed)
Spoke to patient and informed her of the results of her echo as resulted by the provider with the following message: "Heart squeeze remains at 55-60%. Moderate leakage is noted in the mitral and tricuspid. No significant change from prior studies. Overall a reassuring study."  Patient satisfied with results

## 2023-05-07 DIAGNOSIS — H35033 Hypertensive retinopathy, bilateral: Secondary | ICD-10-CM | POA: Diagnosis not present

## 2023-05-07 DIAGNOSIS — H353221 Exudative age-related macular degeneration, left eye, with active choroidal neovascularization: Secondary | ICD-10-CM | POA: Diagnosis not present

## 2023-05-07 DIAGNOSIS — H35361 Drusen (degenerative) of macula, right eye: Secondary | ICD-10-CM | POA: Diagnosis not present

## 2023-05-07 DIAGNOSIS — E113293 Type 2 diabetes mellitus with mild nonproliferative diabetic retinopathy without macular edema, bilateral: Secondary | ICD-10-CM | POA: Diagnosis not present

## 2023-05-07 DIAGNOSIS — H43823 Vitreomacular adhesion, bilateral: Secondary | ICD-10-CM | POA: Diagnosis not present

## 2023-05-07 DIAGNOSIS — H401132 Primary open-angle glaucoma, bilateral, moderate stage: Secondary | ICD-10-CM | POA: Diagnosis not present

## 2023-05-18 NOTE — Progress Notes (Unsigned)
Cardiology Office Note Date:  05/21/2023  Patient ID:  [name] [name], [name] [name] [birth year], MRN [id number] PCP:  Lauro Regulus, MD  Cardiologist:  None Electrophysiologist: Lanier Prude, MD  Chief Complaint: 1 year device follow-up  History of Present Illness: [name] [name] is a 88 y.o. female with PMH notable for tachy-brady syndrome, SND s/p PPM, CKD4, parox afib, CHF, OSA on CPAP; seen today for Lanier Prude, MD for routine electrophysiology followup.  Last saw Dr. Lalla Brothers 05/2022 to establish care transferred from [facility].  Very long AV delay to promote intrinsic conduction.  On amiodarone long-term.   Today, she is doing well.  She is having shortness of breath.  Thinks that it started about March/April of this year.  She routinely gets sinus infections in the spring and thought that it was her usual yearly sinus infection, but it has persisted.  Her PCP has trialed her on multiple inhalers without improvement She checks her blood pressure regularly, readings 130s/70s to 80s.  She does have episodes of palpitations, notices them typically in the middle of the night.  When she has them she checks her blood pressure, pulse and her blood glucose.  Usually her blood pressure and pulse are both "fine", but blood glucose is low. Other than these.   Warfarin managed at PCP office, no bleeding concerns   she denies chest pain, dyspnea, PND, orthopnea, nausea, vomiting, dizziness, syncope, edema, weight gain, or early satiety.    Device Information: MDT dual chamber PPM, imp 12/2006; SND Gen change 2014  AAD History: amiodarone  Past Medical History:  Diagnosis Date   1st degree AV block    a. PR 400 msec with Apacing   A-fib (HCC)    Hx of   Anemia    Anxiety    Arthritis    knees and lower back    Chest pain    a. 2004 Cath:  reportedly nl;  b. 09/2013 Neg MV.   Chronic diastolic heart failure (HCC)    a. 09/2013 Echo: EF 50-55%, mild LVH, mild to mod MR/TR.    Chronic kidney disease    Stage IV   Congestive heart disease (HCC)    Esophageal reflux    Gout    H/O hiatal hernia    History of UTI    Hyperlipidemia    Morbid obesity (HCC)    OSA on CPAP    CPAP   Pacemaker    copy of medtronic card in chart   PAF (paroxysmal atrial fibrillation) (HCC)    Seasonal allergies    SSS (sick sinus syndrome) (HCC)    a. 2008 s/p MDT PPM;  b. 11/2013 Gen change- MDT ADDRL1 Adapta DC PPM, ser # [id number].   Symptomatic PVCs    Syncope and collapse    a. Felt to be vasovagal.   Thyroid disease    nodules on thyroid, with goiter, on no meds per pt   Type II diabetes mellitus (HCC)    Type 2   Unspecified essential hypertension    controlled on meds   Unspecified glaucoma(365.9)    Vertigo    in past   Wears dentures    upper full and lower partial    Past Surgical History:  Procedure Laterality Date   APPENDECTOMY     CARDIAC CATHETERIZATION     CARDIOVERSION N/A 04/26/2017   Procedure: Cardioversion;  Surgeon: Antonieta Iba, MD;  Location: [facility];  Service: Cardiovascular;  Laterality: N/A;  CATARACT EXTRACTION W/PHACO Left 07/21/2020   Procedure: CATARACT EXTRACTION PHACO AND INTRAOCULAR LENS PLACEMENT (IOC) LEFT DIABETIC;  Surgeon: Lockie Mola, MD;  Location: [facility];  Service: Ophthalmology;  Laterality: Left;  10.25 1:13.2 14.0%   CATARACT EXTRACTION W/PHACO Right 08/11/2020   Procedure: CATARACT EXTRACTION PHACO AND INTRAOCULAR LENS PLACEMENT (IOC) RIGHT DIABETIC;  Surgeon: Lockie Mola, MD;  Location: [facility];  Service: Ophthalmology;  Laterality: Right;  11.63 1:25.0 13.7%   DILATION AND CURETTAGE OF UTERUS     INCISION AND DRAINAGE OF WOUND Right 03/1998   "leg; it was like a boil" (12/01/2013)   INSERT / REPLACE / REMOVE PACEMAKER  2008; 12/01/2013   MDT ADDRL1 pacemaker - gen change by Dr Graciela Husbands 12/01/2013   LUNG BIOPSY  2010   [facility]   PERMANENT PACEMAKER GENERATOR CHANGE N/A  12/01/2013   Procedure: PERMANENT PACEMAKER GENERATOR CHANGE;  Surgeon: Duke Salvia, MD;  Location: [facility];  Service: Cardiovascular;  Laterality: N/A;   VAGINAL HYSTERECTOMY  1970    Current Outpatient Medications  Medication Instructions   acetaminophen (TYLENOL) 1,000 mg, Oral, Every 6 hours PRN   allopurinol (ZYLOPRIM) 100 MG tablet 1 tablet, Oral, Daily   amiodarone (PACERONE) 200 mg, Oral, Daily   amLODipine (NORVASC) 5 mg, Oral, Daily   atorvastatin (LIPITOR) 20 MG tablet TAKE 1 TABLET (20 MG TOTAL) BY MOUTH DAILY.   Blood Glucose Monitoring Suppl (FIFTY50 GLUCOSE METER 2.0) w/Device KIT    calcitRIOL (ROCALTROL) 0.25 MCG capsule TAKE 1 CAPSULE (0.25 MCG TOTAL) BY MOUTH 1 (ONE) TIME EACH DAY   carvedilol (COREG) 25-50 mg, Oral, As directed, Take 1 tablet (25MG ) by mouth every morning and 2 tablets (50MG ) by mouth every night before bed   ferrous sulfate 325 mg, Oral, Daily   furosemide (LASIX) 40 mg, Oral, Daily   glipiZIDE (GLUCOTROL) 10 mg, Oral, 2 times daily before meals   Glycerin, Laxative, (FLEET LIQUID GLYCERIN SUPP RE) 1 Dose, Rectal, Daily PRN   KLOR-CON M10 10 MEQ tablet 1 tablet, Oral, Daily   meclizine (ANTIVERT) 25 MG tablet 1 tablet, Oral, 3 times daily PRN   Multiple Vitamins-Minerals (CENTRUM SILVER PO) 1 tablet, Oral, Daily   OneTouch Delica Lancets 33G MISC    ONETOUCH VERIO test strip 3 times daily   pantoprazole (PROTONIX) 40 MG tablet 1 tablet, Oral, Daily   polyethylene glycol (MIRALAX / GLYCOLAX) 17 g, Oral, Daily PRN   sitaGLIPtin (JANUVIA) 25 mg, Oral, Daily   warfarin (COUMADIN) 2 mg, Oral, Daily    Social History:  The patient  reports that she quit smoking about 33 years ago. Her smoking use included cigarettes. She has a 17.00 pack-year smoking history. She has never used smokeless tobacco. She reports that she does not drink alcohol and does not use drugs.   Family History:   The patient's family history includes Allergies in her father;  Breast cancer in her maternal aunt; Heart disease in her father; Hypertension in her sister; Liver cancer in her mother; Pancreatic cancer in her mother; Rheum arthritis in her father and sister.  ROS:  Please see the history of present illness. All other systems are reviewed and otherwise negative.   PHYSICAL EXAM:  VS:  BP 120/74 (BP Location: Left Arm, Patient Position: Sitting, Cuff Size: Large)   Pulse 64   Ht 5\' 4"  (1.626 m)   Wt 243 lb (110.2 kg)   SpO2 96%   BMI 41.71 kg/m  BMI: Body mass index is  41.71 kg/m.  GEN- The patient is well appearing, alert and oriented x 3 today.   Lungs- Clear to ausculation bilaterally, normal work of breathing.  Heart- Regular rate and rhythm, no murmurs, rubs or gallops Extremities- Trace peripheral edema, warm, dry Skin-   device pocket well-healed, no tethering   Device interrogation done today and reviewed by myself:  Battery 4.5 years Lead thresholds, impedence, sensing stable  Dependent Intrinsic AV delay ~690ms Brief AT/AF episodes No changes made today  EKG is not ordered. Personal review of EKG from  03/28/2023  shows:  A pace with 1st deg AV block, rate 60 PR    Recent Labs: 12/29/2022: Platelets 139 03/28/2023: B Natriuretic Peptide 345.0 03/30/2023: Hemoglobin 10.4  No results found for requested labs within last 365 days.   CrCl cannot be calculated (Patient's most recent lab result is older than the maximum 21 days allowed.).   Wt Readings from Last 3 Encounters:  05/21/23 243 lb (110.2 kg)  03/28/23 246 lb (111.6 kg)  02/19/23 248 lb 9.6 oz (112.8 kg)     Additional studies reviewed include: Previous EP, cardiology notes.   Tte, 04/25/2023 1. Left ventricular ejection fraction, by estimation, is 55 to 60%. The left ventricle has normal function. The left ventricle has no regional wall motion abnormalities. Left ventricular diastolic parameters were normal.   2. Right ventricular systolic function is normal.  The right ventricular size is normal. There is mildly elevated pulmonary artery systolic pressure. The estimated right ventricular systolic pressure is 44.7 mmHg.   3. Left atrial size was moderately dilated.   4. The mitral valve is normal in structure. Moderate mitral valve regurgitation. No evidence of mitral stenosis.   5. Tricuspid valve regurgitation is moderate.   6. The aortic valve is tricuspid. Aortic valve regurgitation is not visualized. No aortic stenosis is present.   7. The inferior vena cava is normal in size with greater than 50% respiratory variability, suggesting right atrial pressure of 3 mmHg.   ASSESSMENT AND PLAN:  #) SND s/p MDT PPM #) 1st deg AV block Device functioning well, see paceart for details Very long AV block, device programmed to allow intrinsic conduction Query whether 1st deg AV block contributing to SOB symptoms, though AV block pre-dates SOB  #) parox AFib #) high-risk medication Tolerating amiodarone well Recent labs with PCP with stable electrolytes  #) Hypercoag d/t AFib CHA2DS2-VASc Score = 6 [CHF History: 1, HTN History: 1, Diabetes History: 1, Stroke History: 0, Vascular Disease History: 0, Age Score: 2, Gender Score: 1].  Therefore, the patient's annual risk of stroke is 9.7 % OAC - warfarin, managed at PCP office No bleeding issues.     #) SOB Symptoms started March/April of this year Not resolved with multiple inhalers CXR and PFTs to further eval I have recommended she follow-up with PCP    Current medicines are reviewed at length with the patient today.   The patient does not have concerns regarding her medicines.  The following changes were made today:  none  Labs/ tests ordered today include:  No orders of the defined types were placed in this encounter.    Disposition: Follow up with EP APP in in 6 months   Signed, Sherie Don, NP  05/21/23  12:44 PM  Electrophysiology [facility]

## 2023-05-21 ENCOUNTER — Telehealth: Payer: Self-pay

## 2023-05-21 ENCOUNTER — Encounter: Payer: Self-pay | Admitting: Cardiology

## 2023-05-21 ENCOUNTER — Ambulatory Visit: Payer: PPO | Attending: Cardiology | Admitting: Cardiology

## 2023-05-21 VITALS — BP 120/74 | HR 64 | Ht 64.0 in | Wt 243.0 lb

## 2023-05-21 DIAGNOSIS — Z79899 Other long term (current) drug therapy: Secondary | ICD-10-CM | POA: Diagnosis not present

## 2023-05-21 DIAGNOSIS — I495 Sick sinus syndrome: Secondary | ICD-10-CM | POA: Diagnosis not present

## 2023-05-21 DIAGNOSIS — I480 Paroxysmal atrial fibrillation: Secondary | ICD-10-CM | POA: Diagnosis not present

## 2023-05-21 DIAGNOSIS — R0602 Shortness of breath: Secondary | ICD-10-CM | POA: Diagnosis not present

## 2023-05-21 DIAGNOSIS — I4891 Unspecified atrial fibrillation: Secondary | ICD-10-CM | POA: Diagnosis not present

## 2023-05-21 DIAGNOSIS — Z950 Presence of cardiac pacemaker: Secondary | ICD-10-CM

## 2023-05-21 LAB — CUP PACEART INCLINIC DEVICE CHECK
Date Time Interrogation Session: 20240617163252
Implantable Lead Connection Status: 753985
Implantable Lead Connection Status: 753985
Implantable Lead Implant Date: 20080117
Implantable Lead Implant Date: 20080117
Implantable Lead Location: 753859
Implantable Lead Location: 753860
Implantable Lead Model: 5076
Implantable Lead Model: 5076
Implantable Pulse Generator Implant Date: 20141229

## 2023-05-21 NOTE — Telephone Encounter (Signed)
Spoke with patient and informed her that Sherie Don would like to get additional testing to further evaluate her SOB. Patient agreed to get CXR done at [facility] tomorrow morning and will be expecting a call from our office to schedule PFT's.   Can you please assist in scheduling PFT's? Thank you! Gearldine Bienenstock

## 2023-05-21 NOTE — Patient Instructions (Signed)
Medication Instructions:  Your physician recommends that you continue on your current medications as directed. Please refer to the Current Medication list given to you today.  *If you need a refill on your cardiac medications before your next appointment, please call your pharmacy*   Lab Work: No labs ordered  If you have labs (blood work) drawn today and your tests are completely normal, you will receive your results only by: MyChart Message (if you have MyChart) OR A paper copy in the mail If you have any lab test that is abnormal or we need to change your treatment, we will call you to review the results.   Testing/Procedures: No testing ordered   Follow-Up: At [facility], you and your health needs are our priority.  As part of our continuing mission to provide you with exceptional heart care, we have created designated Provider Care Teams.  These Care Teams include your primary Cardiologist (physician) and Advanced Practice Providers (APPs -  Physician Assistants and Nurse Practitioners) who all work together to provide you with the care you need, when you need it.  We recommend signing up for the patient portal called "MyChart".  Sign up information is provided on this After Visit Summary.  MyChart is used to connect with patients for Virtual Visits (Telemedicine).  Patients are able to view lab/test results, encounter notes, upcoming appointments, etc.  Non-urgent messages can be sent to your provider as well.   To learn more about what you can do with MyChart, go to [email or web address].    Your next appointment:   6 month(s)  Provider:   Sherie Don, NP   Other Instructions Please schedule an appointment with your primary care provider to discuss low blood sugar readings at night, thyroid issues, and further evaluation of shortness of breath.

## 2023-05-21 NOTE — Telephone Encounter (Signed)
-----   Message from Sherie Don, NP sent at 05/21/2023  3:59 PM EDT ----- Regarding: SOB eval HI  I would like to get a chest xray and PFTs for this patient to further evaluate her SOB that she was describing today in clinic. I ordered them. Can you please call her to get them scheduled?   Thanks, [employer]

## 2023-05-22 ENCOUNTER — Ambulatory Visit
Admission: RE | Admit: 2023-05-22 | Discharge: 2023-05-22 | Disposition: A | Discharge status: Home / Self Care | Payer: PPO | Source: Ambulatory Visit | Attending: Cardiology | Admitting: Cardiology

## 2023-05-22 ENCOUNTER — Ambulatory Visit
Admission: RE | Admit: 2023-05-22 | Discharge: 2023-05-22 | Disposition: A | Discharge status: Home / Self Care | Payer: PPO | Attending: Cardiology | Admitting: Cardiology

## 2023-05-22 ENCOUNTER — Telehealth: Payer: Self-pay

## 2023-05-22 DIAGNOSIS — R0602 Shortness of breath: Secondary | ICD-10-CM | POA: Diagnosis not present

## 2023-05-22 NOTE — Telephone Encounter (Signed)
Left detailed message informing patient that her PFT testing has been scheduled for Tuesday 06/12/23 @ 1 pm. [facility] to [facility] at 12:45. Advised patient not to smoke, drink caffeinated products, or use bronchodilators 6 hours prior to testing per Britta Mccreedy, scheduler. Advised patient to call back if she has any further questions.

## 2023-05-23 ENCOUNTER — Ambulatory Visit: Payer: HMO | Admitting: Cardiology

## 2023-05-30 DIAGNOSIS — E042 Nontoxic multinodular goiter: Secondary | ICD-10-CM | POA: Diagnosis not present

## 2023-05-30 DIAGNOSIS — N184 Chronic kidney disease, stage 4 (severe): Secondary | ICD-10-CM | POA: Diagnosis not present

## 2023-05-30 DIAGNOSIS — E1122 Type 2 diabetes mellitus with diabetic chronic kidney disease: Secondary | ICD-10-CM | POA: Diagnosis not present

## 2023-05-30 DIAGNOSIS — R7989 Other specified abnormal findings of blood chemistry: Secondary | ICD-10-CM | POA: Diagnosis not present

## 2023-06-02 DIAGNOSIS — J029 Acute pharyngitis, unspecified: Secondary | ICD-10-CM | POA: Diagnosis not present

## 2023-06-02 DIAGNOSIS — Z7901 Long term (current) use of anticoagulants: Secondary | ICD-10-CM | POA: Diagnosis not present

## 2023-06-04 DIAGNOSIS — H353221 Exudative age-related macular degeneration, left eye, with active choroidal neovascularization: Secondary | ICD-10-CM | POA: Diagnosis not present

## 2023-06-12 ENCOUNTER — Ambulatory Visit: Payer: PPO | Attending: Cardiology

## 2023-06-12 DIAGNOSIS — R0609 Other forms of dyspnea: Secondary | ICD-10-CM | POA: Insufficient documentation

## 2023-06-12 DIAGNOSIS — R0602 Shortness of breath: Secondary | ICD-10-CM | POA: Insufficient documentation

## 2023-06-12 DIAGNOSIS — Z87891 Personal history of nicotine dependence: Secondary | ICD-10-CM | POA: Insufficient documentation

## 2023-06-12 DIAGNOSIS — R942 Abnormal results of pulmonary function studies: Secondary | ICD-10-CM | POA: Diagnosis not present

## 2023-06-12 DIAGNOSIS — R059 Cough, unspecified: Secondary | ICD-10-CM | POA: Diagnosis not present

## 2023-06-12 LAB — PULMONARY FUNCTION TEST ARMC ONLY
DL/VA % pred: 71 %
DL/VA: 2.91 ml/min/mmHg/L
DLCO unc % pred: 22 %
DLCO unc: 4.21 ml/min/mmHg
FEF 25-75 Post: 2.17 L/sec
FEF 25-75 Pre: 1.68 L/sec
FEF2575-%Change-Post: 29 %
FEF2575-%Pred-Post: 188 %
FEF2575-%Pred-Pre: 146 %
FEV1-%Change-Post: 1 %
FEV1-%Pred-Post: 86 %
FEV1-%Pred-Pre: 84 %
FEV1-Post: 1.52 L
FEV1-Pre: 1.49 L
FEV1FVC-%Change-Post: 1 %
FEV1FVC-%Pred-Pre: 117 %
FEV6-%Change-Post: 0 %
FEV6-%Pred-Post: 77 %
FEV6-%Pred-Pre: 77 %
FEV6-Post: 1.75 L
FEV6-Pre: 1.74 L
FEV6FVC-%Pred-Post: 106 %
FEV6FVC-%Pred-Pre: 106 %
FVC-%Change-Post: 0 %
FVC-%Pred-Post: 73 %
FVC-%Pred-Pre: 72 %
FVC-Post: 1.75 L
FVC-Pre: 1.74 L
Post FEV1/FVC ratio: 87 %
Post FEV6/FVC ratio: 100 %
Pre FEV1/FVC ratio: 86 %
Pre FEV6/FVC Ratio: 100 %
RV % pred: 73 %
RV: 1.84 L
TLC % pred: 76 %
TLC: 3.87 L

## 2023-06-12 MED ORDER — ALBUTEROL SULFATE (2.5 MG/3ML) 0.083% IN NEBU
2.5000 mg | INHALATION_SOLUTION | Freq: Once | RESPIRATORY_TRACT | Status: AC
  Administered 2023-06-12: 2.5 mg via RESPIRATORY_TRACT
  Filled 2023-06-12: qty 3

## 2023-06-13 DIAGNOSIS — R600 Localized edema: Secondary | ICD-10-CM | POA: Diagnosis not present

## 2023-06-13 DIAGNOSIS — R7989 Other specified abnormal findings of blood chemistry: Secondary | ICD-10-CM | POA: Diagnosis not present

## 2023-06-13 DIAGNOSIS — G4733 Obstructive sleep apnea (adult) (pediatric): Secondary | ICD-10-CM | POA: Diagnosis not present

## 2023-06-13 DIAGNOSIS — N184 Chronic kidney disease, stage 4 (severe): Secondary | ICD-10-CM | POA: Diagnosis not present

## 2023-06-13 DIAGNOSIS — N2581 Secondary hyperparathyroidism of renal origin: Secondary | ICD-10-CM | POA: Diagnosis not present

## 2023-06-13 DIAGNOSIS — D631 Anemia in chronic kidney disease: Secondary | ICD-10-CM | POA: Diagnosis not present

## 2023-06-13 DIAGNOSIS — I480 Paroxysmal atrial fibrillation: Secondary | ICD-10-CM | POA: Diagnosis not present

## 2023-06-13 DIAGNOSIS — E1129 Type 2 diabetes mellitus with other diabetic kidney complication: Secondary | ICD-10-CM | POA: Diagnosis not present

## 2023-06-13 DIAGNOSIS — K50919 Crohn's disease, unspecified, with unspecified complications: Secondary | ICD-10-CM | POA: Diagnosis not present

## 2023-06-13 DIAGNOSIS — K219 Gastro-esophageal reflux disease without esophagitis: Secondary | ICD-10-CM | POA: Diagnosis not present

## 2023-06-13 DIAGNOSIS — R946 Abnormal results of thyroid function studies: Secondary | ICD-10-CM | POA: Diagnosis not present

## 2023-06-13 DIAGNOSIS — I10 Essential (primary) hypertension: Secondary | ICD-10-CM | POA: Diagnosis not present

## 2023-06-13 DIAGNOSIS — Z7901 Long term (current) use of anticoagulants: Secondary | ICD-10-CM | POA: Diagnosis not present

## 2023-06-13 DIAGNOSIS — Z950 Presence of cardiac pacemaker: Secondary | ICD-10-CM | POA: Diagnosis not present

## 2023-06-21 ENCOUNTER — Other Ambulatory Visit: Payer: Self-pay | Admitting: Nephrology

## 2023-06-21 DIAGNOSIS — D631 Anemia in chronic kidney disease: Secondary | ICD-10-CM | POA: Diagnosis not present

## 2023-06-21 DIAGNOSIS — I10 Essential (primary) hypertension: Secondary | ICD-10-CM | POA: Diagnosis not present

## 2023-06-21 DIAGNOSIS — I480 Paroxysmal atrial fibrillation: Secondary | ICD-10-CM | POA: Diagnosis not present

## 2023-06-21 DIAGNOSIS — N2581 Secondary hyperparathyroidism of renal origin: Secondary | ICD-10-CM | POA: Diagnosis not present

## 2023-06-21 DIAGNOSIS — R600 Localized edema: Secondary | ICD-10-CM | POA: Diagnosis not present

## 2023-06-21 DIAGNOSIS — N184 Chronic kidney disease, stage 4 (severe): Secondary | ICD-10-CM | POA: Diagnosis not present

## 2023-06-21 DIAGNOSIS — G4733 Obstructive sleep apnea (adult) (pediatric): Secondary | ICD-10-CM | POA: Diagnosis not present

## 2023-06-21 DIAGNOSIS — K219 Gastro-esophageal reflux disease without esophagitis: Secondary | ICD-10-CM | POA: Diagnosis not present

## 2023-06-21 DIAGNOSIS — K50919 Crohn's disease, unspecified, with unspecified complications: Secondary | ICD-10-CM | POA: Diagnosis not present

## 2023-06-21 DIAGNOSIS — Z950 Presence of cardiac pacemaker: Secondary | ICD-10-CM | POA: Diagnosis not present

## 2023-06-25 ENCOUNTER — Ambulatory Visit (INDEPENDENT_AMBULATORY_CARE_PROVIDER_SITE_OTHER): Payer: PPO

## 2023-06-25 DIAGNOSIS — I495 Sick sinus syndrome: Secondary | ICD-10-CM | POA: Diagnosis not present

## 2023-06-26 LAB — CUP PACEART REMOTE DEVICE CHECK
Battery Impedance: 1319 Ohm
Battery Remaining Longevity: 50 mo
Battery Voltage: 2.77 V
Brady Statistic AP VP Percent: 7 %
Brady Statistic AP VS Percent: 91 %
Brady Statistic AS VP Percent: 1 %
Brady Statistic AS VS Percent: 1 %
Date Time Interrogation Session: 20240722125017
Implantable Lead Connection Status: 753985
Implantable Lead Connection Status: 753985
Implantable Lead Implant Date: 20080117
Implantable Lead Implant Date: 20080117
Implantable Lead Location: 753859
Implantable Lead Location: 753860
Implantable Lead Model: 5076
Implantable Lead Model: 5076
Implantable Pulse Generator Implant Date: 20141229
Lead Channel Impedance Value: 423 Ohm
Lead Channel Impedance Value: 632 Ohm
Lead Channel Pacing Threshold Amplitude: 0.625 V
Lead Channel Pacing Threshold Amplitude: 1 V
Lead Channel Pacing Threshold Pulse Width: 0.4 ms
Lead Channel Pacing Threshold Pulse Width: 0.4 ms
Lead Channel Setting Pacing Amplitude: 2 V
Lead Channel Setting Pacing Amplitude: 2.5 V
Lead Channel Setting Pacing Pulse Width: 0.4 ms
Lead Channel Setting Sensing Sensitivity: 4 mV
Zone Setting Status: 755011
Zone Setting Status: 755011

## 2023-06-28 ENCOUNTER — Ambulatory Visit: Payer: PPO

## 2023-06-29 ENCOUNTER — Inpatient Hospital Stay: Payer: PPO | Attending: Oncology

## 2023-06-29 ENCOUNTER — Inpatient Hospital Stay: Payer: PPO

## 2023-06-29 ENCOUNTER — Encounter: Payer: Self-pay | Admitting: Oncology

## 2023-06-29 ENCOUNTER — Inpatient Hospital Stay (HOSPITAL_BASED_OUTPATIENT_CLINIC_OR_DEPARTMENT_OTHER): Payer: PPO | Admitting: Oncology

## 2023-06-29 VITALS — BP 130/62 | HR 71 | Temp 96.4°F | Resp 18 | Ht 64.0 in | Wt 242.1 lb

## 2023-06-29 DIAGNOSIS — D631 Anemia in chronic kidney disease: Secondary | ICD-10-CM | POA: Diagnosis not present

## 2023-06-29 DIAGNOSIS — N184 Chronic kidney disease, stage 4 (severe): Secondary | ICD-10-CM

## 2023-06-29 DIAGNOSIS — N1831 Chronic kidney disease, stage 3a: Secondary | ICD-10-CM

## 2023-06-29 DIAGNOSIS — Z7901 Long term (current) use of anticoagulants: Secondary | ICD-10-CM | POA: Diagnosis not present

## 2023-06-29 DIAGNOSIS — D696 Thrombocytopenia, unspecified: Secondary | ICD-10-CM | POA: Diagnosis not present

## 2023-06-29 DIAGNOSIS — I480 Paroxysmal atrial fibrillation: Secondary | ICD-10-CM | POA: Diagnosis not present

## 2023-06-29 LAB — IRON AND TIBC
Iron: 75 ug/dL (ref 28–170)
Saturation Ratios: 25 % (ref 10.4–31.8)
TIBC: 301 ug/dL (ref 250–450)
UIBC: 226 ug/dL

## 2023-06-29 LAB — FERRITIN: Ferritin: 132 ng/mL (ref 11–307)

## 2023-06-29 LAB — CBC
HCT: 34.5 % — ABNORMAL LOW (ref 36.0–46.0)
Hemoglobin: 11.8 g/dL — ABNORMAL LOW (ref 12.0–15.0)
MCH: 26.5 pg (ref 26.0–34.0)
MCHC: 34.2 g/dL (ref 30.0–36.0)
MCV: 77.5 fL — ABNORMAL LOW (ref 80.0–100.0)
Platelets: 142 10*3/uL — ABNORMAL LOW (ref 150–400)
RBC: 4.45 MIL/uL (ref 3.87–5.11)
RDW: 15.3 % (ref 11.5–15.5)
WBC: 6.4 10*3/uL (ref 4.0–10.5)
nRBC: 0 % (ref 0.0–0.2)

## 2023-06-29 NOTE — Progress Notes (Signed)
Hematology/Oncology Consult note [facility]  Telephone:([phone] [phone]  Patient Care Team: Lauro Regulus, MD as PCP - General (Internal Medicine) Lanier Prude, MD as PCP - Electrophysiology (Cardiology) Antonieta Iba, MD as Consulting Physician (Cardiology) Mady Haagensen, MD as Consulting Physician (Nephrology) Marinus Maw, MD as Consulting Physician (Cardiology) Irene Limbo., MD (Ophthalmology) Felecia Shelling, DPM as Consulting Physician (Podiatry) Creig Hines, MD as Consulting Physician (Hematology and Oncology)   Name of the patient: [name] [name]  [id number]  1935-11-25   Date of visit: 06/29/23  Diagnosis-anemia of chronic kidney disease  Chief complaint/ Reason for visit-routine follow-up of anemia  Heme/Onc history: Patient is a 88 year old female with history of anemia of chronic kidney disease for which she is on Retacrit.  She also has hemoglobin C trait.  She has received IV iron in the past as well.  Hemoglobin stable between 10-11 on Retacrit.  Patient has not required Retacrit since July 2023.  Interval history-  Patient is doing well for her age and denies any specific complaints at this time.  ECOG PS- 1 Pain scale- 0 Opioid associated constipation- no  Review of systems- Review of Systems  Constitutional:  Negative for chills, fever, malaise/fatigue and weight loss.  HENT:  Negative for congestion, ear discharge and nosebleeds.   Eyes:  Negative for blurred vision.  Respiratory:  Negative for cough, hemoptysis, sputum production, shortness of breath and wheezing.   Cardiovascular:  Negative for chest pain, palpitations, orthopnea and claudication.  Gastrointestinal:  Negative for abdominal pain, blood in stool, constipation, diarrhea, heartburn, melena, nausea and vomiting.  Genitourinary:  Negative for dysuria, flank pain, frequency, hematuria and urgency.  Musculoskeletal:  Negative for  back pain, joint pain and myalgias.  Skin:  Negative for rash.  Neurological:  Negative for dizziness, tingling, focal weakness, seizures, weakness and headaches.  Endo/Heme/Allergies:  Does not bruise/bleed easily.  Psychiatric/Behavioral:  Negative for depression and suicidal ideas. The patient does not have insomnia.       Allergies  Allergen Reactions   Metoprolol Other (See Comments)    Bad dreams Patient stated that she had suicidal thoughts while taking this medication.    Diltiazem Other (See Comments)    Patient stated that she had bad dreams with this medication   Levofloxacin Nausea And Vomiting and Nausea Only          Past Medical History:  Diagnosis Date   1st degree AV block    a. PR 400 msec with Apacing   A-fib (HCC)    Hx of   Anemia    Anxiety    Arthritis    knees and lower back    Chest pain    a. 2004 Cath:  reportedly nl;  b. 09/2013 Neg MV.   Chronic diastolic heart failure (HCC)    a. 09/2013 Echo: EF 50-55%, mild LVH, mild to mod MR/TR.   Chronic kidney disease    Stage IV   Congestive heart disease (HCC)    Esophageal reflux    Gout    H/O hiatal hernia    History of UTI    Hyperlipidemia    Morbid obesity (HCC)    OSA on CPAP    CPAP   Pacemaker    copy of medtronic card in chart   PAF (paroxysmal atrial fibrillation) (HCC)    Seasonal allergies    SSS (sick sinus syndrome) (HCC)    a. 2008 s/p  MDT PPM;  b. 11/2013 Gen change- MDT ADDRL1 Adapta DC PPM, ser # [id number] H.   Symptomatic PVCs    Syncope and collapse    a. Felt to be vasovagal.   Thyroid disease    nodules on thyroid, with goiter, on no meds per pt   Type II diabetes mellitus (HCC)    Type 2   Unspecified essential hypertension    controlled on meds   Unspecified glaucoma(365.9)    Vertigo    in past   Wears dentures    upper full and lower partial     Past Surgical History:  Procedure Laterality Date   APPENDECTOMY     CARDIAC CATHETERIZATION      CARDIOVERSION N/A 04/26/2017   Procedure: Cardioversion;  Surgeon: Antonieta Iba, MD;  Location: [facility] OR[facility];  Service: Cardiovascular;  Laterality: N/A;   CATARACT EXTRACTION W/PHACO Left 07/21/2020   Procedure: CATARACT EXTRACTION PHACO AND INTRAOCULAR LENS PLACEMENT (IOC) LEFT DIABETIC;  Surgeon: Lockie Mola, MD;  Location: [facility];  Service: Ophthalmology;  Laterality: Left;  10.25 1:13.2 14.0%   CATARACT EXTRACTION W/PHACO Right 08/11/2020   Procedure: CATARACT EXTRACTION PHACO AND INTRAOCULAR LENS PLACEMENT (IOC) RIGHT DIABETIC;  Surgeon: Lockie Mola, MD;  Location: [facility];  Service: Ophthalmology;  Laterality: Right;  11.63 1:25.0 13.7%   DILATION AND CURETTAGE OF UTERUS     INCISION AND DRAINAGE OF WOUND Right 03/1998   "leg; it was like a boil" (12/01/2013)   INSERT / REPLACE / REMOVE PACEMAKER  2008; 12/01/2013   MDT ADDRL1 pacemaker - gen change by Dr Graciela Husbands 12/01/2013   LUNG BIOPSY  2010   [facility]   PERMANENT PACEMAKER GENERATOR CHANGE N/A 12/01/2013   Procedure: PERMANENT PACEMAKER GENERATOR CHANGE;  Surgeon: Duke Salvia, MD;  Location: [facility];  Service: Cardiovascular;  Laterality: N/A;   VAGINAL HYSTERECTOMY  1970    Social History   Socioeconomic History   Marital status: Widowed    Spouse name: Not on file   Number of children: Y   Years of education: Not on file   Highest education level: Not on file  Occupational History   Occupation: retired  Tobacco Use   Smoking status: Former    Current packs/day: 0.00    Average packs/day: 0.5 packs/day for 34.0 years (17.0 ttl pk-yrs)    Types: Cigarettes    Start date: 08/16/1955    Quit date: 08/15/1989    Years since quitting: 33.8   Smokeless tobacco: Never  Vaping Use   Vaping status: Never Used  Substance and Sexual Activity   Alcohol use: No    Alcohol/week: 0.0 standard drinks of alcohol   Drug use: No   Sexual activity: Never  Other Topics Concern   Not on file   Social History Narrative   Retired. Widowed. Regularly exercises.    Social Determinants of Health   Financial Resource Strain: Unknown (12/01/2018)   Overall Financial Resource Strain (CARDIA)    Difficulty of Paying Living Expenses: Patient declined  Food Insecurity: Unknown (12/01/2018)   Hunger Vital Sign    Worried About Running Out of Food in the Last Year: Patient declined    Ran Out of Food in the Last Year: Patient declined  Transportation Needs: Unknown (12/01/2018)   PRAPARE - Administrator, Civil Service (Medical): Patient declined    Lack of Transportation (Non-Medical): Patient declined  Physical Activity: Unknown (12/01/2018)   Exercise Vital Sign    Days of  Exercise per Week: Patient declined    Minutes of Exercise per Session: Patient declined  Stress: Unknown (12/01/2018)   [employer] of Occupational Health - Occupational Stress Questionnaire    Feeling of Stress : Patient declined  Social Connections: Unknown (12/01/2018)   Social Connection and Isolation Panel [NHANES]    Frequency of Communication with Friends and Family: Patient declined    Frequency of Social Gatherings with Friends and Family: Patient declined    Attends Religious Services: Patient declined    Database administrator or Organizations: Patient declined    Attends Banker Meetings: Patient declined    Marital Status: Patient declined  Intimate Partner Violence: Unknown (12/01/2018)   Humiliation, Afraid, Rape, and Kick questionnaire    Fear of Current or Ex-Partner: Patient declined    Emotionally Abused: Patient declined    Physically Abused: Patient declined    Sexually Abused: Patient declined    Family History  Problem Relation Age of Onset   Heart disease Father    Rheum arthritis Father    Allergies Father    Liver cancer Mother    Pancreatic cancer Mother    Hypertension Sister    Rheum arthritis Sister    Breast cancer Maternal Aunt       Current Outpatient Medications:    acetaminophen (TYLENOL) 500 MG tablet, Take 1,000 mg by mouth every 6 (six) hours as needed for moderate pain. , Disp: , Rfl:    allopurinol (ZYLOPRIM) 100 MG tablet, Take 1 tablet by mouth daily., Disp: , Rfl:    amiodarone (PACERONE) 200 MG tablet, Take 1 tablet (200 mg total) by mouth daily., Disp: 90 tablet, Rfl: 3   amLODipine (NORVASC) 5 MG tablet, Take 5 mg by mouth daily., Disp: , Rfl:    atorvastatin (LIPITOR) 20 MG tablet, TAKE 1 TABLET (20 MG TOTAL) BY MOUTH DAILY., Disp: 90 tablet, Rfl: 0   Blood Glucose Monitoring Suppl (FIFTY50 GLUCOSE METER 2.0) w/Device KIT, , Disp: , Rfl:    calcitRIOL (ROCALTROL) 0.25 MCG capsule, TAKE 1 CAPSULE (0.25 MCG TOTAL) BY MOUTH 1 (ONE) TIME EACH DAY, Disp: , Rfl:    carvedilol (COREG) 25 MG tablet, Take 1-2 tablets (25-50 mg total) by mouth as directed. Take 1 tablet (25MG ) by mouth every morning and 2 tablets (50MG ) by mouth every night before bed, Disp: 270 tablet, Rfl: 3   ferrous sulfate 325 (65 FE) MG EC tablet, Take 325 mg by mouth daily. , Disp: , Rfl:    furosemide (LASIX) 20 MG tablet, Take 40 mg by mouth daily., Disp: , Rfl:    glipiZIDE (GLUCOTROL) 10 MG tablet, TAKE 1 TABLET (10 MG TOTAL) BY MOUTH 2 (TWO) TIMES DAILY BEFORE A MEAL., Disp: 180 tablet, Rfl: 0   Glycerin, Laxative, (FLEET LIQUID GLYCERIN SUPP RE), Place 1 Dose rectally daily as needed (constipation)., Disp: , Rfl:    KLOR-CON M10 10 MEQ tablet, Take 1 tablet by mouth daily., Disp: , Rfl: 3   meclizine (ANTIVERT) 25 MG tablet, Take 1 tablet by mouth 3 (three) times daily as needed., Disp: , Rfl:    Multiple Vitamins-Minerals (CENTRUM SILVER PO), Take 1 tablet by mouth daily. , Disp: , Rfl:    OneTouch Delica Lancets 33G MISC, , Disp: , Rfl:    ONETOUCH VERIO test strip, 3 (three) times daily., Disp: , Rfl:    pantoprazole (PROTONIX) 40 MG tablet, Take 1 tablet by mouth daily., Disp: , Rfl:    polyethylene glycol (MIRALAX /  GLYCOLAX)  packet, Take 17 g by mouth daily as needed for moderate constipation., Disp: , Rfl:    sitaGLIPtin (JANUVIA) 25 MG tablet, Take 25 mg by mouth daily., Disp: , Rfl:    warfarin (COUMADIN) 2 MG tablet, Take 2 mg by mouth daily., Disp: , Rfl:   Physical exam:  Vitals:   06/29/23 1028 06/29/23 1033  BP: (!) 160/94 130/62  Pulse: 72 71  Resp: 18   Temp: (!) 96.4 F (35.8 C)   TempSrc: Tympanic   Weight: 242 lb 1.6 oz (109.8 kg)   Height: 5\' 4"  (1.626 m)    Physical Exam Cardiovascular:     Rate and Rhythm: Normal rate and regular rhythm.     Heart sounds: Normal heart sounds.  Pulmonary:     Effort: Pulmonary effort is normal.     Breath sounds: Normal breath sounds.  Skin:    General: Skin is warm and dry.  Neurological:     Mental Status: She is alert and oriented to person, place, and time.         Latest Ref Rng & Units 12/20/2021   10:38 AM  CMP  Glucose 70 - 99 mg/dL 500   BUN 8 - 23 mg/dL 31   Creatinine 9.38 - 1.00 mg/dL 1.82   Sodium 993 - 716 mmol/L 138   Potassium 3.5 - 5.1 mmol/L 4.2   Chloride 98 - 111 mmol/L 102   CO2 22 - 32 mmol/L 26   Calcium 8.9 - 10.3 mg/dL 9.5   Total Protein 6.5 - 8.1 g/dL 6.7   Total Bilirubin 0.3 - 1.2 mg/dL 0.8   Alkaline Phos 38 - 126 U/L 72   AST 15 - 41 U/L 25   ALT 0 - 44 U/L 24       Latest Ref Rng & Units 06/29/2023   10:05 AM  CBC  WBC 4.0 - 10.5 K/uL 6.4   Hemoglobin 12.0 - 15.0 g/dL 96.7   Hematocrit 89.3 - 46.0 % 34.5   Platelets 150 - 400 K/uL 142     No images are attached to the encounter.  CUP PACEART REMOTE DEVICE CHECK  Result Date: 06/26/2023 Scheduled remote reviewed. Normal device function. Prolonged AV conduction.  3 AHR, longest lasting 8 hr 41 min, total AF burden 2.1%.  Known history of AF, on amiodarone and warfarin. Next remote 91 days. - CS, CVRS    Assessment and plan- Patient is a 88 y.o. female here for routine follow-up of anemia of chronic Disease  Patient's hemoglobin has remained  stable between 10-11 for the last 1 year despite not being on Retacrit.  Today her hemoglobin is 11.8.  She has chronic microcytosis secondary to hemoglobin C trait.  Also has mild stable thrombocytopenia.  Iron studies from today are not indicative of iron deficiency.  Given that she has had stable counts over the last year I will repeat CBC ferritin and iron studies in 6 months in 1 year and see her back in 1 year   Visit Diagnosis 1. Anemia due to chronic renal failure treated with erythropoietin, stage 4 (severe) (HCC)      Dr. Owens Shark, MD, MPH [facility] at [facility] [phone] 06/29/2023 12:48 PM

## 2023-07-03 ENCOUNTER — Encounter: Payer: Self-pay | Admitting: Cardiology

## 2023-07-03 ENCOUNTER — Ambulatory Visit
Admission: RE | Admit: 2023-07-03 | Discharge: 2023-07-03 | Disposition: A | Discharge status: Home / Self Care | Payer: PPO | Source: Ambulatory Visit | Attending: Nephrology | Admitting: Nephrology

## 2023-07-03 ENCOUNTER — Ambulatory Visit: Payer: PPO | Attending: Cardiology | Admitting: Cardiology

## 2023-07-03 VITALS — BP 136/68 | HR 67 | Ht 64.0 in | Wt 245.6 lb

## 2023-07-03 DIAGNOSIS — E119 Type 2 diabetes mellitus without complications: Secondary | ICD-10-CM | POA: Diagnosis not present

## 2023-07-03 DIAGNOSIS — N1330 Unspecified hydronephrosis: Secondary | ICD-10-CM | POA: Diagnosis not present

## 2023-07-03 DIAGNOSIS — Z950 Presence of cardiac pacemaker: Secondary | ICD-10-CM | POA: Diagnosis not present

## 2023-07-03 DIAGNOSIS — N189 Chronic kidney disease, unspecified: Secondary | ICD-10-CM | POA: Diagnosis not present

## 2023-07-03 DIAGNOSIS — R942 Abnormal results of pulmonary function studies: Secondary | ICD-10-CM | POA: Diagnosis not present

## 2023-07-03 DIAGNOSIS — D631 Anemia in chronic kidney disease: Secondary | ICD-10-CM

## 2023-07-03 DIAGNOSIS — N183 Chronic kidney disease, stage 3 unspecified: Secondary | ICD-10-CM | POA: Diagnosis not present

## 2023-07-03 DIAGNOSIS — N184 Chronic kidney disease, stage 4 (severe): Secondary | ICD-10-CM | POA: Diagnosis not present

## 2023-07-03 DIAGNOSIS — G4733 Obstructive sleep apnea (adult) (pediatric): Secondary | ICD-10-CM | POA: Diagnosis not present

## 2023-07-03 DIAGNOSIS — I5032 Chronic diastolic (congestive) heart failure: Secondary | ICD-10-CM

## 2023-07-03 DIAGNOSIS — I495 Sick sinus syndrome: Secondary | ICD-10-CM

## 2023-07-03 DIAGNOSIS — E782 Mixed hyperlipidemia: Secondary | ICD-10-CM

## 2023-07-03 DIAGNOSIS — I10 Essential (primary) hypertension: Secondary | ICD-10-CM

## 2023-07-03 DIAGNOSIS — I480 Paroxysmal atrial fibrillation: Secondary | ICD-10-CM | POA: Diagnosis not present

## 2023-07-03 DIAGNOSIS — Z7984 Long term (current) use of oral hypoglycemic drugs: Secondary | ICD-10-CM

## 2023-07-03 DIAGNOSIS — R0602 Shortness of breath: Secondary | ICD-10-CM

## 2023-07-03 DIAGNOSIS — N2581 Secondary hyperparathyroidism of renal origin: Secondary | ICD-10-CM | POA: Diagnosis not present

## 2023-07-03 DIAGNOSIS — N281 Cyst of kidney, acquired: Secondary | ICD-10-CM | POA: Diagnosis not present

## 2023-07-03 NOTE — Patient Instructions (Addendum)
Medication Instructions:  Your physician recommends that you continue on your current medications as directed. Please refer to the Current Medication list given to you today.  *If you need a refill on your cardiac medications before your next appointment, please call your pharmacy*   Lab Work: none If you have labs (blood work) drawn today and your tests are completely normal, you will receive your results only by: MyChart Message (if you have MyChart) OR A paper copy in the mail If you have any lab test that is abnormal or we need to change your treatment, we will call you to review the results.   Testing/Procedures: none   Follow-Up: At [facility], you and your health needs are our priority.  As part of our continuing mission to provide you with exceptional heart care, we have created designated Provider Care Teams.  These Care Teams include your primary Cardiologist (physician) and Advanced Practice Providers (APPs -  Physician Assistants and Nurse Practitioners) who all work together to provide you with the care you need, when you need it.  We recommend signing up for the patient portal called "MyChart".  Sign up information is provided on this After Visit Summary.  MyChart is used to connect with patients for Virtual Visits (Telemedicine).  Patients are able to view lab/test results, encounter notes, upcoming appointments, etc.  Non-urgent messages can be sent to your provider as well.   To learn more about what you can do with MyChart, go to [email or web address].    Your next appointment:   3 month(s)  Provider:   [name], MD

## 2023-07-03 NOTE — Progress Notes (Signed)
Cardiology Office Note:  .   Date:  07/03/2023  ID:  [name] [name], DOB Aug 02, 1935, MRN [id number] PCP: [name] Regulus, MD  [facility] Providers Cardiologist:  None Electrophysiologist:  [name] Prude, MD    History of Present Illness: Marland Kitchen   [name] [name] is a 88 y.o. female with a past medical history of paroxysmal atrial fibrillation on chronic anticoagulation with warfarin, morbid obesity, symptomatic palpitations with PVCs, sick sinus syndrome/tachybradycardia syndrome with permanent pacemaker placement, chronic palpitations, chronic shortness of breath with exertion, chronic fatigue, obstructive sleep apnea on CPAP, chronic renal failure, who presents today for follow-up.  She had undergone Myoview stress testing in October 2020 which showed no significant ischemia ischemia with normal ejection fraction.  Last echocardiogram completed May 2022 showed normal LV SF, moderate TR, mild to moderate MR, mild PR, and EF 55-60%.  She was seen in clinic on 03/28/23 with complaints of shortness of breath, fatigue, peripheral edema without weight gain.  She said the symptoms been ongoing for the past year but is gradually progressed.  She was sent for lab visit and is scheduled for an echocardiogram.  She was last seen in clinic 05/21/2023 by EP for device interrogation.  At that time her device was functioning well.  She continued to have shortness of breath it was recommended that has not resolved with multiple inhalers and she is recommended that the patient follow-up with her PCP.  She was also scheduled for PFTs.  She returns to clinic today with continued complaints of shortness of breath, fatigue, peripheral edema, without weight gain.  She states symptoms been ongoing since approximately March.  She was recently seen by EP who ordered PFTs and was concerned that her prescribed AV block may be contributing to her shortness of breath.  Device was interrogated which was functioning well  without any changes made.  She continues to deny chest pain, chest pressure, or palpitations.  Is asymptomatic to her atrial fibrillation.  She has been continued on warfarin without issues of bleeding or blood noted in her stool or urine.  She does continue to follow-up with oncology for anemia as well with a last hemoglobin being stable at 10.9.  She denies any hospitalizations or visits to the emergency department  ROS: 10 point review of systems has been reviewed and considered negative with exception of what is been listed in the HPI  Studies Reviewed: Marland Kitchen   EKG Interpretation Date/Time:  Tuesday July 03 2023 11:19:12 EDT Ventricular Rate:  61 PR Interval:    QRS Duration:  92 QT Interval:  472 QTC Calculation: 475 R Axis:   -9  Text Interpretation: ATRIAL PACED RHYTHM (long PR interval), V sensed Nonspecific T wave abnormality When compared with ECG of 19-Jan-2019 05:45, Nonspecific T wave abnormality now evident in Anterior leads Confirmed by [name] [name] ([id number]) on 07/03/2023 12:48:20 PM   TTE 04/25/23 1. Left ventricular ejection fraction, by estimation, is 55 to 60%. The  left ventricle has normal function. The left ventricle has no regional  wall motion abnormalities. Left ventricular diastolic parameters were  normal.   2. Right ventricular systolic function is normal. The right ventricular  size is normal. There is mildly elevated pulmonary artery systolic  pressure. The estimated right ventricular systolic pressure is 44.7 mmHg.   3. Left atrial size was moderately dilated.   4. The mitral valve is normal in structure. Moderate mitral valve  regurgitation. No evidence of mitral stenosis.   5. Tricuspid  valve regurgitation is moderate.   6. The aortic valve is tricuspid. Aortic valve regurgitation is not  visualized. No aortic stenosis is present.   7. The inferior vena cava is normal in size with greater than 50%  respiratory variability, suggesting right atrial pressure of  3 mmHg.   TTE 12/18/18 Left ventricle: The cavity size was normal. Wall thickness was    increased in a pattern of mild LVH. Systolic function was normal.    The estimated ejection fraction was in the range of 55% to 65%.  - Mitral valve: There was mild regurgitation.  - Left atrium: The atrium was mildly dilated.  Risk Assessment/Calculations:    CHA2DS2-VASc Score = 6   This indicates a 9.7% annual risk of stroke. The patient's score is based upon: CHF History: 1 HTN History: 1 Diabetes History: 1 Stroke History: 0 Vascular Disease History: 0 Age Score: 2 Gender Score: 1            Physical Exam:   VS:  BP 136/68 (BP Location: Left Arm, Patient Position: Sitting, Cuff Size: Large)   Pulse 67   Ht 5\' 4"  (1.626 m)   Wt 245 lb 9.6 oz (111.4 kg)   SpO2 99%   BMI 42.16 kg/m    Wt Readings from Last 3 Encounters:  07/03/23 245 lb 9.6 oz (111.4 kg)  06/29/23 242 lb 1.6 oz (109.8 kg)  05/21/23 243 lb (110.2 kg)    GEN: Well nourished, well developed in no acute distress NECK: No JVD; No carotid bruits CARDIAC: RRR, no murmurs, rubs, gallops RESPIRATORY:  Clear to auscultation without rales, wheezing or rhonchi  ABDOMEN: Soft, non-tender, obese, non-distended EXTREMITIES: Trace pretibial edema; No deformity   ASSESSMENT AND PLAN: .   HFpEF with last LVEF 55 to 60% with no regional wall motion abnormalities.  There is moderate mitral valve regurgitation and moderate tricuspid regurgitation.  She has progressive shortness of breath and dyspnea on exertion.  PCP had placed her on several inhalers that have not helped.  She is continued on furosemide 40 mg daily.  She is not a candidate for SGLT2 inhibitor or MRA due to serum creatinine is elevated that continues to be followed by nephrology.  Progressive shortness of breath and dyspnea on exertion with PFTs being completed with minimal restriction test interstitial and severe diffusion defect.  Could be a component of anemia or  hypoxemia.  She has been referred to pulmonary for further evaluation.  We have also reached out to EP to determine if it could be caused by her amiodarone.  Paroxysmal atrial fibrillation which she is atrially paced on EKG today.  She continues to remain on Coumadin without incident INRs are checked per PCP.  She is also on amiodarone 200 mg daily carvedilol home 25 mg in the morning and 50 mg before bedtime.  Hyperlipidemia with last LDL 96 she is continued on 20 mg of atorvastatin this continues to be monitored by her PCP.  Primary hypertension with blood pressure today 136/68.  Blood pressure has been well-controlled on amlodipine, and carvedilol as well as furosemide.  Encouraged to continue to monitor blood pressures at home as well.  Type 2 diabetes where she is on glipizide and Januvia.  Continues to be monitored by her PCP.  Chronic kidney disease with kidney function has remained stable on current medication regimen.  She continues to be followed by nephrology  Cardiac pacemaker in situ with a history of sick sinus syndrome and tachybradycardia syndrome.  She is atrially paced on EKG today with a long PR interval over reach out to EP to ensure device changes are not needed to be made.       Dispo: Patient to return to clinic to see MD/APP in 3 months or sooner to reevaluate symptoms  Signed, Heavan Francom, NP

## 2023-07-05 ENCOUNTER — Institutional Professional Consult (permissible substitution): Payer: PPO | Admitting: Internal Medicine

## 2023-07-05 NOTE — Progress Notes (Signed)
Remote pacemaker transmission.   

## 2023-07-13 DIAGNOSIS — Z7901 Long term (current) use of anticoagulants: Secondary | ICD-10-CM | POA: Diagnosis not present

## 2023-07-13 DIAGNOSIS — I4891 Unspecified atrial fibrillation: Secondary | ICD-10-CM | POA: Diagnosis not present

## 2023-07-14 ENCOUNTER — Other Ambulatory Visit (HOSPITAL_BASED_OUTPATIENT_CLINIC_OR_DEPARTMENT_OTHER): Payer: Self-pay | Admitting: Cardiology

## 2023-07-16 NOTE — Telephone Encounter (Signed)
This is a [name] [name] pt 

## 2023-07-18 ENCOUNTER — Encounter: Payer: Self-pay | Admitting: Pulmonary Disease

## 2023-07-18 ENCOUNTER — Ambulatory Visit (INDEPENDENT_AMBULATORY_CARE_PROVIDER_SITE_OTHER): Payer: PPO | Admitting: Pulmonary Disease

## 2023-07-18 VITALS — BP 124/62 | HR 82 | Temp 97.7°F | Ht 64.0 in | Wt 241.8 lb

## 2023-07-18 DIAGNOSIS — R0602 Shortness of breath: Secondary | ICD-10-CM | POA: Diagnosis not present

## 2023-07-18 NOTE — Progress Notes (Addendum)
Synopsis: Referred in by Charlsie Quest, NP   Subjective:   PATIENT ID: [name] [name] GENDER: female DOB: 1935/05/31, MRN: [id number]  Chief Complaint  Patient presents with   pulmonary consult    SOB with exertion and mild dry cough    HPI [name] [name] is an 88 year old female patient with a past medical history of anemia of chronic disease, first-degree AV block status post pacemaker, A-fib on Coumadin, CKD stage IV, type 2 diabetes mellitus, OSA on CPAP.  Presenting to the pulmonary clinic for evaluation of shortness of breath and abnormal PFTs.  She reports shortness of breath and fatigue since March of this year.  Describes it mostly on exertion even turning over in bed will make her gasp for air.  She denies any cough or sputum production.  She denies any chest pain.  She denies any wheezing or chest tightness.  She denies any loss of appetite or any weight loss.  She does have a history of allergic rhinitis and has a history of OSA on CPAP.  She underwent an echocardiogram as outpatient in May of this year that showed an EF of 55 to 60%, moderate mitral valve regurgitation, mildly elevated pulmonary arterial pressure and moderate tricuspid regurgitation.  Which was not changed from prior studies.    She also underwent PFTs that showed mild restriction with decreased TLC at 76% of predicted.  Mild restrictive pattern on spirometry without obstruction however severely reduced DLCO of 22% of predicted not corrected to hemoglobin.  This pattern could be seen in combined obstructive and restrictive process or an pulmonary vascular disease.  Family history -she denies any family history of lung disease.  Social history -she smoked half pack per day for 20 years quit smoking in 1990.  She denies alcohol use worked in [employer].  Has 2 sons no pets at home.  ROS All systems were reviewed and are negative except for the above.  Objective:   Vitals:   07/18/23 1129  BP: 124/62  Pulse:  82  Temp: 97.7 F (36.5 C)  TempSrc: Temporal  SpO2: 96%  Weight: 241 lb 12.8 oz (109.7 kg)  Height: 5\' 4"  (1.626 m)   96% on RA BMI Readings from Last 3 Encounters:  07/18/23 41.50 kg/m  07/03/23 42.16 kg/m  06/29/23 41.56 kg/m   Wt Readings from Last 3 Encounters:  07/18/23 241 lb 12.8 oz (109.7 kg)  07/03/23 245 lb 9.6 oz (111.4 kg)  06/29/23 242 lb 1.6 oz (109.8 kg)    Physical Exam GEN: NAD, Obese HEENT: Supple Neck, Reactive Pupils, EOMI  CVS: irregularly irregular rhythm, normal rate, systolic murmur heard at the mitral site Lungs: Clear bilateral air entry.  Abdomen: Soft, non tender, non distended, + BS  Extremities: Warm and well perfused, +1 LE edema Skin: No suspicious lesions appreciated  Psych: Normal Affect  Ancillary Information   CBC    Component Value Date/Time   WBC 6.4 06/29/2023 1005   RBC 4.45 06/29/2023 1005   HGB 11.8 (L) 06/29/2023 1005   HGB 9.4 (L) 04/18/2017 0945   HCT 34.5 (L) 06/29/2023 1005   HCT 30.6 (L) 04/18/2017 0945   PLT 142 (L) 06/29/2023 1005   PLT 222 04/18/2017 0945   MCV 77.5 (L) 06/29/2023 1005   MCV 70 (L) 04/18/2017 0945   MCV 70 (L) 08/03/2014 1525   MCH 26.5 06/29/2023 1005   MCHC 34.2 06/29/2023 1005   RDW 15.3 06/29/2023 1005   RDW 19.6 (H) 04/18/2017  0945   RDW 16.6 (H) 08/03/2014 1525   LYMPHSABS 1.4 06/29/2022 1416   LYMPHSABS 2.0 11/24/2013 1057   LYMPHSABS 2.2 09/24/2013 0556   MONOABS 0.5 06/29/2022 1416   MONOABS 0.7 09/24/2013 0556   EOSABS 0.2 06/29/2022 1416   EOSABS 0.2 11/24/2013 1057   EOSABS 0.2 09/24/2013 0556   BASOSABS 0.0 06/29/2022 1416   BASOSABS 0.0 11/24/2013 1057   BASOSABS 0.0 09/24/2013 0556    Imaging  CT soft tissue neck in 2020: Shows the apices of fluid in the lung without any concerning findings.  Also showed patulous esophagus.  Echocardiogram May 2024: LVEF 55 to 60% with normal diastolic parameters.  RV systolic function is normal.  Mildly elevated pulmonary artery  systolic pressure measuring at 45 mmHg.  Mean moderate mitral valve regurgitation.  PFTs July 2024 FVC 1.74l (LLM 1.69) 72% of Predicted, FEV-1 1.49L (LLN 1.18) 84%of predicted. FEV-1 to FVC ratio 86. TLC 3.86 (LLN 3.98) 76% of predicted. DLCO 22% of predicted not corrected to patients hemoglobin.      Latest Ref Rng & Units 06/12/2023    1:32 PM  PFT Results  FVC-Pre L 1.74   FVC-Predicted Pre % 72   FVC-Post L 1.75   FVC-Predicted Post % 73   Pre FEV1/FVC % % 86   Post FEV1/FCV % % 87   FEV1-Pre L 1.49   FEV1-Predicted Pre % 84   FEV1-Post L 1.52   DLCO uncorrected ml/min/mmHg 4.21   DLCO UNC% % 22   DLVA Predicted % 71   TLC L 3.87   TLC % Predicted % 76   RV % Predicted % 73      Assessment & Plan:  [name] [name] is an 88 year old female patient with a past medical history of anemia of chronic disease, first-degree AV block status post pacemaker, A-fib on Coumadin, CKD stage IV, type 2 diabetes mellitus, OSA on CPAP.  Presenting to the pulmonary clinic for evaluation of shortness of breath and abnormal PFTs.  #Dyspnea on exertion  Her presentation of exertional dyspnea is multifactorial.  Possibly secondary to obesity deconditioning and anemia.  Her PFTs show severely reduced DLCO out of proportion to the degree of restriction which raises the concern for pulmonary vascular disease likely group 2 in the setting of her moderate mitral valve regurgitation and group 3 in the setting of OSA. But this was mild based on her most recent echocardiogram (Although moderate TR was seen).  Furthermore the findings on her PFTs can be seen in patients with combined restrictive and obstructive pulmonary disease however from reviewing her prior CT and her history it is less likely that she has either COPD or fibrosis.   Amiodarone pulmonary toxicity is possible specially with new onset dyspnea, medication restarted in 2023 at 200mg  which fits the timeline of 6 to 12 months. Restriction pattern with  reduced DLCO is usually seen. And CT chest will give [place] a better idea (Typically shows new diffuse reticular, consolidative or mixed opacities).   []  CT scan of the chest []  6-minute walk test []  Continue with volume optimization per cardiology (Dr. Mariah Milling).  []  Advised on importance of weight loss.  #Paroxysmal A.fib on coumadin  #Moderate mitral valve regurgitation  #1st degree AV block s/p pacemaker  #Hypertension Currently on Coumadin with therapeutic INR. On Amiodarone 200mg  PO daily  On Lasix 40 mg p.o. daily.  On carvedilol 25 mg in the morning and 50 mg at night. On Amlodipine 5mg  daily.   #Type  II DM  - On Glipizide and Sitagliptin.   Return in about 3 months (around 10/18/2023).  I spent 60 minutes caring for this patient today, including preparing to see the patient, obtaining a medical history , reviewing a separately obtained history, performing a medically appropriate examination and/or evaluation, counseling and educating the patient/family/caregiver, ordering medications, tests, or procedures, documenting clinical information in the electronic health record, and independently interpreting results (not separately reported/billed) and communicating results to the patient/family/caregiver  Janann Colonel, MD  Pulmonary Critical Care 07/18/2023 12:51 PM

## 2023-07-24 DIAGNOSIS — H353221 Exudative age-related macular degeneration, left eye, with active choroidal neovascularization: Secondary | ICD-10-CM | POA: Diagnosis not present

## 2023-07-27 ENCOUNTER — Ambulatory Visit
Admission: RE | Admit: 2023-07-27 | Discharge: 2023-07-27 | Disposition: A | Discharge status: Home / Self Care | Payer: PPO | Source: Ambulatory Visit | Attending: Pulmonary Disease | Admitting: Pulmonary Disease

## 2023-07-27 DIAGNOSIS — J984 Other disorders of lung: Secondary | ICD-10-CM | POA: Diagnosis not present

## 2023-07-27 DIAGNOSIS — R0602 Shortness of breath: Secondary | ICD-10-CM | POA: Insufficient documentation

## 2023-07-27 DIAGNOSIS — R918 Other nonspecific abnormal finding of lung field: Secondary | ICD-10-CM | POA: Diagnosis not present

## 2023-08-02 ENCOUNTER — Ambulatory Visit: Payer: PPO | Admitting: Family Medicine

## 2023-08-09 ENCOUNTER — Telehealth: Payer: Self-pay | Admitting: Pulmonary Disease

## 2023-08-09 NOTE — Telephone Encounter (Signed)
Patient would like results of CT scan. Patient phone number is [phone].

## 2023-08-09 NOTE — Telephone Encounter (Signed)
CT was done on 07/27/2023.

## 2023-08-10 ENCOUNTER — Telehealth: Payer: Self-pay | Admitting: Pulmonary Disease

## 2023-08-10 DIAGNOSIS — I4891 Unspecified atrial fibrillation: Secondary | ICD-10-CM | POA: Diagnosis not present

## 2023-08-10 DIAGNOSIS — R0602 Shortness of breath: Secondary | ICD-10-CM

## 2023-08-10 DIAGNOSIS — Z7901 Long term (current) use of anticoagulants: Secondary | ICD-10-CM | POA: Diagnosis not present

## 2023-08-10 NOTE — Telephone Encounter (Signed)
Called patient with CT chest results that did not show any signs of Amiodarone pulmonary toxicity. Will send TSH T3 T4 to assess thyroid function. Follow up is scheduled in Nov.

## 2023-08-10 NOTE — Telephone Encounter (Signed)
Janann Colonel, MD 08/10/2023  2:54 PM EDT     Discussed with patient over the phone.    Nothing further needed.

## 2023-08-10 NOTE — Telephone Encounter (Signed)
Please advise 

## 2023-08-17 ENCOUNTER — Encounter: Payer: Self-pay | Admitting: Cardiology

## 2023-08-17 ENCOUNTER — Ambulatory Visit: Payer: PPO | Attending: Cardiology | Admitting: Cardiology

## 2023-08-17 ENCOUNTER — Other Ambulatory Visit
Admission: RE | Admit: 2023-08-17 | Discharge: 2023-08-17 | Disposition: A | Discharge status: Home / Self Care | Payer: PPO | Source: Ambulatory Visit | Attending: Pulmonary Disease | Admitting: Pulmonary Disease

## 2023-08-17 VITALS — BP 130/54 | HR 60 | Ht 64.0 in | Wt 244.2 lb

## 2023-08-17 DIAGNOSIS — I5032 Chronic diastolic (congestive) heart failure: Secondary | ICD-10-CM

## 2023-08-17 DIAGNOSIS — I10 Essential (primary) hypertension: Secondary | ICD-10-CM

## 2023-08-17 DIAGNOSIS — I480 Paroxysmal atrial fibrillation: Secondary | ICD-10-CM | POA: Diagnosis not present

## 2023-08-17 DIAGNOSIS — Z950 Presence of cardiac pacemaker: Secondary | ICD-10-CM

## 2023-08-17 DIAGNOSIS — E782 Mixed hyperlipidemia: Secondary | ICD-10-CM

## 2023-08-17 DIAGNOSIS — E119 Type 2 diabetes mellitus without complications: Secondary | ICD-10-CM | POA: Diagnosis not present

## 2023-08-17 DIAGNOSIS — R0602 Shortness of breath: Secondary | ICD-10-CM | POA: Insufficient documentation

## 2023-08-17 DIAGNOSIS — N183 Chronic kidney disease, stage 3 unspecified: Secondary | ICD-10-CM | POA: Diagnosis not present

## 2023-08-17 LAB — TSH: TSH: 0.615 u[IU]/mL (ref 0.350–4.500)

## 2023-08-17 NOTE — Addendum Note (Signed)
Addended by: Lonell Face C on: 08/17/2023 02:30 PM   Modules accepted: Orders

## 2023-08-17 NOTE — Patient Instructions (Signed)
Medication Instructions:  Your Physician recommend you continue on your current medication as directed.    *If you need a refill on your cardiac medications before your next appointment, please call your pharmacy*   Lab Work: None ordered today.  If you have labs (blood work) drawn today and your tests are completely normal, you will receive your results only by: MyChart Message (if you have MyChart) OR A paper copy in the mail If you have any lab test that is abnormal or we need to change your treatment, we will call you to review the results.   Testing/Procedures: Your provider has ordered a Lexiscan/ Exercise Myoview Stress test. This will take place at [facility]. Please report to the [facility] medical mall entrance. The volunteers at the first desk will direct you where to go.  [facility] MYOVIEW  Your provider has ordered a Stress Test with nuclear imaging. The purpose of this test is to evaluate the blood supply to your heart muscle. This procedure is referred to as a "Non-Invasive Stress Test." This is because other than having an IV started in your vein, nothing is inserted or "invades" your body. Cardiac stress tests are done to find areas of poor blood flow to the heart by determining the extent of coronary artery disease (CAD). Some patients exercise on a treadmill, which naturally increases the blood flow to your heart, while others who are unable to walk on a treadmill due to physical limitations will have a pharmacologic/chemical stress agent called Lexiscan . This medicine will mimic walking on a treadmill by temporarily increasing your coronary blood flow.   Please note: these test may take anywhere between 2-4 hours to complete  How to prepare for your Myoview test:  Nothing to eat for 6 hours prior to the test No caffeine for 24 hours prior to test No smoking 24 hours prior to test. Hold Glipizide the morning of your test. Your medication may be taken with water. Ladies, please do not  wear dresses.  Skirts or pants are appropriate. Please wear a short sleeve shirt. No perfume, cologne or lotion. Wear comfortable walking shoes. No heels!   PLEASE NOTIFY THE OFFICE AT LEAST 24 HOURS IN ADVANCE IF YOU ARE UNABLE TO KEEP YOUR APPOINTMENT.  [phone] AND  PLEASE NOTIFY NUCLEAR MEDICINE AT [facility] AT LEAST 24 HOURS IN ADVANCE IF YOU ARE UNABLE TO KEEP YOUR APPOINTMENT. [phone]    Follow-Up: At [facility], you and your health needs are our priority.  As part of our continuing mission to provide you with exceptional heart care, we have created designated Provider Care Teams.  These Care Teams include your primary Cardiologist (physician) and Advanced Practice Providers (APPs -  Physician Assistants and Nurse Practitioners) who all work together to provide you with the care you need, when you need it.  We recommend signing up for the patient portal called "MyChart".  Sign up information is provided on this After Visit Summary.  MyChart is used to connect with patients for Virtual Visits (Telemedicine).  Patients are able to view lab/test results, encounter notes, upcoming appointments, etc.  Non-urgent messages can be sent to your provider as well.   To learn more about what you can do with MyChart, go to [email or web address].    Your next appointment:   1-2  week(s) with EP Keep your appointment on 10/08/23 with Dr. Mariah Milling.

## 2023-08-17 NOTE — Progress Notes (Addendum)
Cardiology Office Note:  .   Date:  08/17/2023  ID:  [name], DOB Jan 30, 1935, MRN [id number] PCP: Lauro Regulus, MD  [facility] Providers Cardiologist:  None Electrophysiologist:  Lanier Prude, MD    History of Present Illness: Marland Kitchen   WINONA SERLIN is a 88 y.o. female with past medical history of paroxysmal atrial fibrillation on chronic anticoagulation with warfarin, morbid obesity, symptomatic palpitations with PVCs, sick sinus syndrome status/tachybradycardia syndrome with PPM, chronic palpitations, chronic shortness of breath with exertion, chronic fatigue, obstructive sleep apnea on CPAP, chronic renal failure, who presents today for follow-up.  Myoview stress testing completed in October 2020 showed no significant ischemia with normal ejection fraction. Echocardiogram completed May 2022 showed normal LV SF, moderate TR, mild to moderate MR, mild PR, and EF of 55 to 60%.  05/21/2023 her device interrogation was completed by EP at that time he was doing well she continued to have shortness of breath she was scheduled for PFTs.  Echocardiogram completed 04/2023 revealed LVEF of 55 to 60%, no RWMA, moderate mitral valve regurgitation, moderate tricuspid regurgitation.  She was last seen in clinic 07/03/2023 with complaints of shortness of breath fatigue peripheral edema without weight gain.  She states symptoms been ongoing since approximately March.  Had been ordered PFTs and was concerned about her her prescribed antiarrhythmic causing her shortness of breath.  She been tolerating her warfarin without incident and without any noted bleeding.  She was last seen by pulmonary on 07/18/2023.  PFTs were reviewed and showed severely reduced DLCO out of proportion to the degree of restriction which raises the concern for pulmonary vascular disease likely group 2 in the setting of her moderate mitral valve regurgitation and group 3 in the setting of OSA.  Findings were also considered  consistent with patient's with combined restrictive and obstructive pulmonary disease which was thought to be less likely.  She was scheduled for CT of the chest to rule out amiodarone toxicity and was advised that her CT results did not show any signs of amiodarone pulmonary toxicity.  She was scheduled for thyroid function studies.  She returns to clinic today stating that she is continues to have progressive worsening exertional dyspnea.  She states she has follow-up with pulmonary that is not related to her amiodarone or primarily her lungs.  She denies any chest pain, palpitations, lightheadedness or dizziness.  She does occasionally have peripheral edema which she states her left extremity more so than the right extremity but she continues to wear compression stockings.  She denies any bleeding with noted blood in her stool or urine with on Coumadin therapy and her INRs have remained therapeutic.  She is completely compliant with her current medication regimen.  She denies any recent hospitalizations or visits to the emergency department.  ROS: 10 point review of systems has been reviewed and considered negative with exception of what is been listed in the HPI.  Studies Reviewed: Marland Kitchen   EKG Interpretation Date/Time:  Friday August 17 2023 15:18:16 EDT Ventricular Rate:  60 PR Interval:  390 QRS Duration:  90 QT Interval:  464 QTC Calculation: 464 R Axis:   -14  Text Interpretation: Atrial-paced rhythm with prolonged AV conduction When compared with ECG of 03-Jul-2023 11:19, Confirmed by Charlsie Quest ([id number]) on 08/17/2023 3:19:45 PM    TTE 04/25/23 1. Left ventricular ejection fraction, by estimation, is 55 to 60%. The  left ventricle has normal function. The left ventricle has no regional  wall motion abnormalities. Left ventricular diastolic parameters were  normal.   2. Right ventricular systolic function is normal. The right ventricular  size is normal. There is mildly elevated  pulmonary artery systolic  pressure. The estimated right ventricular systolic pressure is 44.7 mmHg.   3. Left atrial size was moderately dilated.   4. The mitral valve is normal in structure. Moderate mitral valve  regurgitation. No evidence of mitral stenosis.   5. Tricuspid valve regurgitation is moderate.   6. The aortic valve is tricuspid. Aortic valve regurgitation is not  visualized. No aortic stenosis is present.   7. The inferior vena cava is normal in size with greater than 50%  respiratory variability, suggesting right atrial pressure of 3 mmHg.    TTE 12/18/18 Left ventricle: The cavity size was normal. Wall thickness was    increased in a pattern of mild LVH. Systolic function was normal.    The estimated ejection fraction was in the range of 55% to 65%.  - Mitral valve: There was mild regurgitation.  - Left atrium: The atrium was mildly dilated.  Risk Assessment/Calculations:    CHA2DS2-VASc Score = 6   This indicates a 9.7% annual risk of stroke. The patient's score is based upon: CHF History: 1 HTN History: 1 Diabetes History: 1 Stroke History: 0 Vascular Disease History: 0 Age Score: 2 Gender Score: 1            Physical Exam:   VS:  BP (!) 130/54 (BP Location: Left Arm, Patient Position: Sitting, Cuff Size: Large)   Pulse 60   Ht 5\' 4"  (1.626 m)   Wt 244 lb 3.2 oz (110.8 kg)   SpO2 97%   BMI 41.92 kg/m    Wt Readings from Last 3 Encounters:  08/17/23 244 lb 3.2 oz (110.8 kg)  07/18/23 241 lb 12.8 oz (109.7 kg)  07/03/23 245 lb 9.6 oz (111.4 kg)    GEN: Well nourished, well developed in no acute distress NECK: No JVD; No carotid bruits CARDIAC: RRR, no murmurs, rubs, gallops RESPIRATORY:  Clear to auscultation without rales, wheezing or rhonchi  ABDOMEN: Soft, non-tender, obese, non-distended EXTREMITIES: Trace pretibial edema; No deformity   ASSESSMENT AND PLAN: .   Chronic HFpEF with last LVEF 55-60% with no RWMA.  Moderate MR and moderate TR.   She continues to have progressive shortness of breath with exertional dyspnea.  PCP had previously placed on several inhalers that have not helped.  She was sent to pulmonary for concern for amiodarone toxicity causing lung issues due to her shortness of breath and ruled out amiodarone toxicity with testing completed with pulmonary.  She is continued on furosemide 40 mg daily and carvedilol 25 mg 1 tablet in the morning and 2 tablets in the evening before bedtime.  She has not a candidate for SGLT2 200 our MRI due to serum creatinine is elevated which she continues to follow with neurology.  With continued progressive exertional shortness of breath and unchanged echocardiogram she has been scheduled for a Lexiscan Myoview as an anginal equivalent to rule out ischemic cause of her current symptoms.  She has been 3 pounds since last visit.  She does have trace pretibial edema to her bilateral lower extremities stating that her left leg is also more swollen than the right.  She continues to have compression stockings on today.  Paroxysmal atrial fibrillation which she is atrial paced on EKG today with prolonged AV conduction.  She continues to remain on Coumadin without  incident.  INR she stated earlier this week was 2 which was at goal.  She is continued on amiodarone 200 mg daily and carvedilol 25 mg morning 50 mg before bed.  Hemoglobin has remained stable 11.8.  Primary hypertension with blood pressure today 130/54.  Blood sugars been well-controlled on amlodipine, carvedilol, and furosemide.  She has been encouraged to continue to monitor blood pressures 1 to 2 hours postmedication administration at home.  Hyperlipidemia with last LDL of 96 she is continued on 20 mg of atorvastatin.  This continues to be monitored by her PCP.  Chronic kidney disease kidney function has remained stable on current medication regimen.  She continues to be followed by nephrology.  Type 2 diabetes.  She is on glipizide and  Januvia.  Continues to be monitored by her PCP.  Cardiac pacemaker in situ with a history of sick sinus syndrome and tachybradycardia syndrome.  She is atrial paced on EKG today with a long PR interval.  We reached out to EP visit prolonged PR interval can cause shortness of breath.  There were some device changes made today during her exam by Percell Belt, NP with EP with follow up scheduled in the next few weeks with EP for reevaluation.     Informed Consent   Shared Decision Making/Informed Consent The risks [chest pain, shortness of breath, cardiac arrhythmias, dizziness, blood pressure fluctuations, myocardial infarction, stroke/transient ischemic attack, nausea, vomiting, allergic reaction, radiation exposure, metallic taste sensation and life-threatening complications (estimated to be 1 in 10,000)], benefits (risk stratification, diagnosing coronary artery disease, treatment guidance) and alternatives of a nuclear stress test were discussed in detail with Ms. Mackie and she agrees to proceed.     Dispo: Patient to return to clinic to see primary cardiologist Dr. Mariah Milling had previously scheduled appointment on 11//24.  She is to return to clinic to follow-up with EP after device changes made today in the next 2 to 3 weeks.  Signed, Kjerstin Abrigo, NP

## 2023-08-18 LAB — T4: T4, Total: 11.5 ug/dL (ref 4.5–12.0)

## 2023-08-18 LAB — T3, FREE: T3, Free: 1.6 pg/mL — ABNORMAL LOW (ref 2.0–4.4)

## 2023-08-21 ENCOUNTER — Encounter: Payer: Self-pay | Admitting: Pulmonary Disease

## 2023-08-24 ENCOUNTER — Encounter
Admission: RE | Admit: 2023-08-24 | Discharge: 2023-08-24 | Disposition: A | Discharge status: Home / Self Care | Payer: PPO | Source: Ambulatory Visit | Attending: Cardiology | Admitting: Cardiology

## 2023-08-24 DIAGNOSIS — I480 Paroxysmal atrial fibrillation: Secondary | ICD-10-CM | POA: Diagnosis not present

## 2023-08-24 LAB — NM MYOCAR MULTI W/SPECT W/WALL MOTION / EF
Estimated workload: 1
Exercise duration (min): 0 min
Exercise duration (sec): 0 s
LV dias vol: 68 mL (ref 46–106)
LV sys vol: 21 mL
MPHR: 135 {beats}/min
Nuc Stress EF: 69 %
Peak HR: 73 {beats}/min
Percent HR: 54 %
Rest HR: 64 {beats}/min
Rest Nuclear Isotope Dose: 10.3 mCi
SDS: 0
SRS: 0
SSS: 0
ST Depression (mm): 0 mm
Stress Nuclear Isotope Dose: 29.8 mCi
TID: 1.02

## 2023-08-24 MED ORDER — REGADENOSON 0.4 MG/5ML IV SOLN
0.4000 mg | Freq: Once | INTRAVENOUS | Status: AC
  Administered 2023-08-24: 0.4 mg via INTRAVENOUS

## 2023-08-24 MED ORDER — TECHNETIUM TC 99M TETROFOSMIN IV KIT
10.3400 | PACK | Freq: Once | INTRAVENOUS | Status: AC | PRN
  Administered 2023-08-24: 10.34 via INTRAVENOUS

## 2023-08-24 MED ORDER — TECHNETIUM TC 99M TETROFOSMIN IV KIT
30.0000 | PACK | Freq: Once | INTRAVENOUS | Status: AC | PRN
  Administered 2023-08-24: 29.79 via INTRAVENOUS

## 2023-08-24 NOTE — Progress Notes (Signed)
Stress testing did not reveal any evidence of ischemia or infarct.  Study was considered normal and low risk.

## 2023-08-30 NOTE — Progress Notes (Signed)
Cardiology Office Note Date:  08/31/2023  Patient ID:  [name] [name], [name] [name] 1935/03/11, MRN [id number] PCP:  Lauro Regulus, MD  Cardiologist:  None Electrophysiologist: Lanier Prude, MD  Chief Complaint: SOB  History of Present Illness: [name] [name] is a 88 y.o. female with PMH notable for tachy-brady syndrome, SND s/p PPM, CKD4, parox afib, CHF, OSA on CPAP; seen today for Lanier Prude, MD for SOB follow-up.  Last saw Dr. Lalla Brothers 05/2022 to establish care transferred from [facility].  Very long AV delay to promote intrinsic conduction.  On amiodarone long-term.  I saw her for routine follow-up 03/2023 where she c/o increased SOB x 1 month. Extensive eval with pulmonary has been unrevealing: has trialed multiple inhalers without improvement, PFTs concerning for interstitial process, but CT chest did not show amio lung toxicity. I briefly saw the patient 08/2023 during NP Hammock's appt, and shortened the patient's PR interval.   On follow-up today, patient states that her SOB is much better. She has more energy. Still gets SOB and feels fatigued at times, specifically after her recent [facility] chair aerobics class. She used to not be able to complete the class, but has now returned to taking the class 3x/week.  She continues to have afib episodes, they are usually brief about 1-2 hours.  Continues to have lower extremity edema that is stable.  Diligently takes eliquis BID, no missed doses, no bleeding concerns.    she denies chest pain, nausea, vomiting, dizziness, syncope, weight gain, or early satiety.    Device Information: MDT dual chamber PPM, imp 12/2006; SND Gen change 2014  AAD History: Amiodarone   Past Medical History:  Diagnosis Date   1st degree AV block    a. PR 400 msec with Apacing   A-fib (HCC)    Hx of   Anemia    Anxiety    Arthritis    knees and lower back    Chest pain    a. 2004 Cath:  reportedly nl;  b. 09/2013 Neg MV.   Chronic diastolic  heart failure (HCC)    a. 09/2013 Echo: EF 50-55%, mild LVH, mild to mod MR/TR.   Chronic kidney disease    Stage IV   Congestive heart disease (HCC)    Esophageal reflux    Gout    H/O hiatal hernia    History of UTI    Hyperlipidemia    Morbid obesity (HCC)    OSA on CPAP    CPAP   Pacemaker    copy of medtronic card in chart   PAF (paroxysmal atrial fibrillation) (HCC)    Seasonal allergies    SSS (sick sinus syndrome) (HCC)    a. 2008 s/p MDT PPM;  b. 11/2013 Gen change- MDT ADDRL1 Adapta DC PPM, ser # [id number].   Symptomatic PVCs    Syncope and collapse    a. Felt to be vasovagal.   Thyroid disease    nodules on thyroid, with goiter, on no meds per pt   Type II diabetes mellitus (HCC)    Type 2   Unspecified essential hypertension    controlled on meds   Unspecified glaucoma(365.9)    Vertigo    in past   Wears dentures    upper full and lower partial    Past Surgical History:  Procedure Laterality Date   APPENDECTOMY     CARDIAC CATHETERIZATION     CARDIOVERSION N/A 04/26/2017   Procedure: Cardioversion;  Surgeon: Mariah Milling,  Tollie Pizza, MD;  Location: [facility] ORS;  Service: Cardiovascular;  Laterality: N/A;   CATARACT EXTRACTION W/PHACO Left 07/21/2020   Procedure: CATARACT EXTRACTION PHACO AND INTRAOCULAR LENS PLACEMENT (IOC) LEFT DIABETIC;  Surgeon: Lockie Mola, MD;  Location: [facility];  Service: Ophthalmology;  Laterality: Left;  10.25 1:13.2 14.0%   CATARACT EXTRACTION W/PHACO Right 08/11/2020   Procedure: CATARACT EXTRACTION PHACO AND INTRAOCULAR LENS PLACEMENT (IOC) RIGHT DIABETIC;  Surgeon: Lockie Mola, MD;  Location: [facility];  Service: Ophthalmology;  Laterality: Right;  11.63 1:25.0 13.7%   DILATION AND CURETTAGE OF UTERUS     INCISION AND DRAINAGE OF WOUND Right 03/1998   "leg; it was like a boil" (12/01/2013)   INSERT / REPLACE / REMOVE PACEMAKER  2008; 12/01/2013   MDT ADDRL1 pacemaker - gen change by Dr Graciela Husbands  12/01/2013   LUNG BIOPSY  2010   [facility]   PERMANENT PACEMAKER GENERATOR CHANGE N/A 12/01/2013   Procedure: PERMANENT PACEMAKER GENERATOR CHANGE;  Surgeon: Duke Salvia, MD;  Location: [facility] CATH LAB;  Service: Cardiovascular;  Laterality: N/A;   VAGINAL HYSTERECTOMY  1970    Current Outpatient Medications  Medication Instructions   acetaminophen (TYLENOL) 1,000 mg, Oral, Every 6 hours PRN   allopurinol (ZYLOPRIM) 100 MG tablet 1 tablet, Oral, Daily   amiodarone (PACERONE) 200 mg, Oral, Daily   amLODipine (NORVASC) 5 mg, Oral, Daily   atorvastatin (LIPITOR) 20 MG tablet TAKE 1 TABLET (20 MG TOTAL) BY MOUTH DAILY.   Blood Glucose Monitoring Suppl (FIFTY50 GLUCOSE METER 2.0) w/Device KIT    calcitRIOL (ROCALTROL) 0.25 MCG capsule TAKE 1 CAPSULE (0.25 MCG TOTAL) BY MOUTH 1 (ONE) TIME EACH DAY   carvedilol (COREG) 25-50 mg, Oral, As directed, Take 1 tablet (25MG ) by mouth every morning and 2 tablets (50MG ) by mouth every night before bed   ferrous sulfate 325 mg, Oral, Daily   furosemide (LASIX) 40 mg, Oral, Daily   glipiZIDE (GLUCOTROL) 10 mg, Oral, 2 times daily before meals   Glycerin, Laxative, (FLEET LIQUID GLYCERIN SUPP RE) 1 Dose, Rectal, Daily PRN   KLOR-CON M10 10 MEQ tablet 1 tablet, Oral, Daily   meclizine (ANTIVERT) 25 MG tablet 1 tablet, Oral, 3 times daily PRN   Multiple Vitamins-Minerals (CENTRUM SILVER PO) 1 tablet, Oral, Daily   OneTouch Delica Lancets 33G MISC    ONETOUCH VERIO test strip 3 times daily   pantoprazole (PROTONIX) 40 MG tablet 1 tablet, Oral, Daily   polyethylene glycol (MIRALAX / GLYCOLAX) 17 g, Oral, Daily PRN   sitaGLIPtin (JANUVIA) 25 mg, Oral, Daily   warfarin (COUMADIN) 2 mg, Oral, Daily    Social History:  The patient  reports that she quit smoking about 34 years ago. Her smoking use included cigarettes. She started smoking about 68 years ago. She has a 17 pack-year smoking history. She has never used smokeless tobacco. She reports that she does not  drink alcohol and does not use drugs.   Family History:   The patient's family history includes Allergies in her father; Breast cancer in her maternal aunt; Heart disease in her father; Hypertension in her sister; Liver cancer in her mother; Pancreatic cancer in her mother; Rheum arthritis in her father and sister.  ROS:  Please see the history of present illness. All other systems are reviewed and otherwise negative.   PHYSICAL EXAM:  VS:  BP 130/60 (BP Location: Left Arm, Patient Position: Sitting, Cuff Size: Large)   Pulse 67   Ht 5\' 4"  (1.626 m)  Wt 244 lb 8 oz (110.9 kg)   SpO2 96%   BMI 41.97 kg/m  BMI: Body mass index is 41.97 kg/m.  GEN- The patient is well appearing, alert and oriented x 3 today.   Lungs- Clear to ausculation bilaterally, normal work of breathing.  Heart- Regular rate and rhythm, no murmurs, rubs or gallops Extremities- 1+ peripheral edema, warm, dry Skin-   device pocket well-healed, no tethering   Device interrogation done today and reviewed by myself:  Battery 3.5 years Lead thresholds, impedence, sensing stable  Dependent VP has increased to 99% Two AT/AF episodes noted, longest 2h Histograms blunted Adjusted RR and increased upper rate limit 110> 120   EKG is not ordered. Personal review of EKG from  9/13/20214  shows:  A pace with 1st deg AV block, rate 60 PR     Recent Labs: 03/28/2023: B Natriuretic Peptide 345.0 06/29/2023: Hemoglobin 11.8; Platelets 142 08/17/2023: TSH 0.615  No results found for requested labs within last 365 days.   CrCl cannot be calculated (Patient's most recent lab result is older than the maximum 21 days allowed.).   Wt Readings from Last 3 Encounters:  08/31/23 244 lb 8 oz (110.9 kg)  08/17/23 244 lb 3.2 oz (110.8 kg)  07/18/23 241 lb 12.8 oz (109.7 kg)     Additional studies reviewed include: Previous EP, cardiology notes.   NM myocardial w spect, 08/24/2023   The study is normal. The study is low  risk.   No ST deviation was noted.   LV perfusion is normal. There is no evidence of ischemia. There is no evidence of infarction.   Left ventricular function is normal. End diastolic cavity size is normal. End systolic cavity size is normal.   CT attenuation images showed mild aortic calcifications.  TTE, 04/25/2023 1. Left ventricular ejection fraction, by estimation, is 55 to 60%. The left ventricle has normal function. The left ventricle has no regional wall motion abnormalities. Left ventricular diastolic parameters were normal.   2. Right ventricular systolic function is normal. The right ventricular size is normal. There is mildly elevated pulmonary artery systolic pressure. The estimated right ventricular systolic pressure is 44.7 mmHg.   3. Left atrial size was moderately dilated.   4. The mitral valve is normal in structure. Moderate mitral valve regurgitation. No evidence of mitral stenosis.   5. Tricuspid valve regurgitation is moderate.   6. The aortic valve is tricuspid. Aortic valve regurgitation is not visualized. No aortic stenosis is present.   7. The inferior vena cava is normal in size with greater than 50% respiratory variability, suggesting right atrial pressure of 3 mmHg.   ASSESSMENT AND PLAN:  #) SND s/p MDT PPM #) 1st deg AV block #) SOB Device functioning well, slight adjustments to RR algorithms  Patient's SOB has improved with shortening her PR interval, this has resulted in a high VP Will closely watch LVEF, updated echo in ~2 months  #) parox AFib #) high-risk medication Tolerating amiodarone well Recent thyroid and LFTs stable  #) Hypercoag d/t AFib CHA2DS2-VASc Score = 6 [CHF History: 1, HTN History: 1, Diabetes History: 1, Stroke History: 0, Vascular Disease History: 0, Age Score: 2, Gender Score: 1].  Therefore, the patient's annual risk of stroke is 9.7 % OAC - warfarin, managed at PCP office No bleeding issues.         Current medicines are  reviewed at length with the patient today.   The patient does not have concerns  regarding her medicines.  The following changes were made today:  none  Labs/ tests ordered today include:  Orders Placed This Encounter  Procedures   ECHOCARDIOGRAM COMPLETE     Disposition: Follow up with Dr. Lalla Brothers or EP APP in 3 months with echo prior to appt   Signed, Sherie Don, NP  08/31/23  11:58 AM  Electrophysiology [facility]

## 2023-08-31 ENCOUNTER — Encounter: Payer: Self-pay | Admitting: Cardiology

## 2023-08-31 ENCOUNTER — Ambulatory Visit: Payer: PPO | Attending: Cardiology | Admitting: Cardiology

## 2023-08-31 VITALS — BP 130/60 | HR 67 | Ht 64.0 in | Wt 244.5 lb

## 2023-08-31 DIAGNOSIS — Z79899 Other long term (current) drug therapy: Secondary | ICD-10-CM | POA: Diagnosis not present

## 2023-08-31 DIAGNOSIS — I440 Atrioventricular block, first degree: Secondary | ICD-10-CM

## 2023-08-31 DIAGNOSIS — I495 Sick sinus syndrome: Secondary | ICD-10-CM | POA: Diagnosis not present

## 2023-08-31 DIAGNOSIS — D6869 Other thrombophilia: Secondary | ICD-10-CM | POA: Diagnosis not present

## 2023-08-31 DIAGNOSIS — I480 Paroxysmal atrial fibrillation: Secondary | ICD-10-CM | POA: Diagnosis not present

## 2023-08-31 LAB — CUP PACEART INCLINIC DEVICE CHECK
Date Time Interrogation Session: 20240927130227
Implantable Lead Connection Status: 753985
Implantable Lead Connection Status: 753985
Implantable Lead Implant Date: 20080117
Implantable Lead Implant Date: 20080117
Implantable Lead Location: 753859
Implantable Lead Location: 753860
Implantable Lead Model: 5076
Implantable Lead Model: 5076
Implantable Pulse Generator Implant Date: 20141229

## 2023-08-31 NOTE — Patient Instructions (Signed)
Medication Instructions:  The current medical regimen is effective;  continue present plan and medications.  *If you need a refill on your cardiac medications before your next appointment, please call your pharmacy*   Testing/Procedures:  Your physician has requested that you have an echocardiogram (before appointment in 3 months). Echocardiography is a painless test that uses sound waves to create images of your heart. It provides your doctor with information about the size and shape of your heart and how well your heart's chambers and valves are working.   You may receive an ultrasound enhancing agent through an IV if needed to better visualize your heart during the echo. This procedure takes approximately one hour.  There are no restrictions for this procedure.  This will take place at [address], [place] [address]    Follow-Up: At [facility], you and your health needs are our priority.  As part of our continuing mission to provide you with exceptional heart care, we have created designated Provider Care Teams.  These Care Teams include your primary Cardiologist (physician) and Advanced Practice Providers (APPs -  Physician Assistants and Nurse Practitioners) who all work together to provide you with the care you need, when you need it.  We recommend signing up for the patient portal called "MyChart".  Sign up information is provided on this After Visit Summary.  MyChart is used to connect with patients for Virtual Visits (Telemedicine).  Patients are able to view lab/test results, encounter notes, upcoming appointments, etc.  Non-urgent messages can be sent to your provider as well.   To learn more about what you can do with MyChart, go to [email or web address].    Your next appointment:   3 month(s)  Provider:   Sherie Don, NP

## 2023-09-04 DIAGNOSIS — H353221 Exudative age-related macular degeneration, left eye, with active choroidal neovascularization: Secondary | ICD-10-CM | POA: Diagnosis not present

## 2023-09-06 DIAGNOSIS — I4891 Unspecified atrial fibrillation: Secondary | ICD-10-CM | POA: Diagnosis not present

## 2023-09-06 DIAGNOSIS — Z7901 Long term (current) use of anticoagulants: Secondary | ICD-10-CM | POA: Diagnosis not present

## 2023-09-24 ENCOUNTER — Ambulatory Visit (INDEPENDENT_AMBULATORY_CARE_PROVIDER_SITE_OTHER): Payer: PPO

## 2023-09-24 ENCOUNTER — Other Ambulatory Visit: Payer: PPO

## 2023-09-24 DIAGNOSIS — I480 Paroxysmal atrial fibrillation: Secondary | ICD-10-CM

## 2023-09-24 LAB — CUP PACEART REMOTE DEVICE CHECK
Battery Impedance: 1402 Ohm
Battery Remaining Longevity: 46 mo
Battery Voltage: 2.77 V
Brady Statistic AP VP Percent: 37 %
Brady Statistic AP VS Percent: 0 %
Brady Statistic AS VP Percent: 62 %
Brady Statistic AS VS Percent: 1 %
Date Time Interrogation Session: 20241021123539
Implantable Lead Connection Status: 753985
Implantable Lead Connection Status: 753985
Implantable Lead Implant Date: 20080117
Implantable Lead Implant Date: 20080117
Implantable Lead Location: 753859
Implantable Lead Location: 753860
Implantable Lead Model: 5076
Implantable Lead Model: 5076
Implantable Pulse Generator Implant Date: 20141229
Lead Channel Impedance Value: 417 Ohm
Lead Channel Impedance Value: 613 Ohm
Lead Channel Pacing Threshold Amplitude: 0.625 V
Lead Channel Pacing Threshold Amplitude: 1 V
Lead Channel Pacing Threshold Pulse Width: 0.4 ms
Lead Channel Pacing Threshold Pulse Width: 0.4 ms
Lead Channel Setting Pacing Amplitude: 2 V
Lead Channel Setting Pacing Amplitude: 2.5 V
Lead Channel Setting Pacing Pulse Width: 0.4 ms
Lead Channel Setting Sensing Sensitivity: 4 mV
Zone Setting Status: 755011
Zone Setting Status: 755011

## 2023-09-26 DIAGNOSIS — I10 Essential (primary) hypertension: Secondary | ICD-10-CM | POA: Diagnosis not present

## 2023-09-26 DIAGNOSIS — I480 Paroxysmal atrial fibrillation: Secondary | ICD-10-CM | POA: Diagnosis not present

## 2023-09-26 DIAGNOSIS — Z950 Presence of cardiac pacemaker: Secondary | ICD-10-CM | POA: Diagnosis not present

## 2023-09-26 DIAGNOSIS — D631 Anemia in chronic kidney disease: Secondary | ICD-10-CM | POA: Diagnosis not present

## 2023-09-26 DIAGNOSIS — K219 Gastro-esophageal reflux disease without esophagitis: Secondary | ICD-10-CM | POA: Diagnosis not present

## 2023-09-26 DIAGNOSIS — G4733 Obstructive sleep apnea (adult) (pediatric): Secondary | ICD-10-CM | POA: Diagnosis not present

## 2023-09-26 DIAGNOSIS — N2581 Secondary hyperparathyroidism of renal origin: Secondary | ICD-10-CM | POA: Diagnosis not present

## 2023-09-26 DIAGNOSIS — K50919 Crohn's disease, unspecified, with unspecified complications: Secondary | ICD-10-CM | POA: Diagnosis not present

## 2023-09-26 DIAGNOSIS — R7309 Other abnormal glucose: Secondary | ICD-10-CM | POA: Diagnosis not present

## 2023-09-26 DIAGNOSIS — R600 Localized edema: Secondary | ICD-10-CM | POA: Diagnosis not present

## 2023-09-26 DIAGNOSIS — N184 Chronic kidney disease, stage 4 (severe): Secondary | ICD-10-CM | POA: Diagnosis not present

## 2023-09-26 LAB — HEMOGLOBIN A1C: Hemoglobin A1C: 7.2

## 2023-10-02 DIAGNOSIS — I4891 Unspecified atrial fibrillation: Secondary | ICD-10-CM | POA: Diagnosis not present

## 2023-10-02 DIAGNOSIS — Z7901 Long term (current) use of anticoagulants: Secondary | ICD-10-CM | POA: Diagnosis not present

## 2023-10-03 DIAGNOSIS — Z950 Presence of cardiac pacemaker: Secondary | ICD-10-CM | POA: Diagnosis not present

## 2023-10-03 DIAGNOSIS — G4733 Obstructive sleep apnea (adult) (pediatric): Secondary | ICD-10-CM | POA: Diagnosis not present

## 2023-10-03 DIAGNOSIS — I10 Essential (primary) hypertension: Secondary | ICD-10-CM | POA: Diagnosis not present

## 2023-10-03 DIAGNOSIS — K219 Gastro-esophageal reflux disease without esophagitis: Secondary | ICD-10-CM | POA: Diagnosis not present

## 2023-10-03 DIAGNOSIS — I480 Paroxysmal atrial fibrillation: Secondary | ICD-10-CM | POA: Diagnosis not present

## 2023-10-03 DIAGNOSIS — K50919 Crohn's disease, unspecified, with unspecified complications: Secondary | ICD-10-CM | POA: Diagnosis not present

## 2023-10-03 DIAGNOSIS — D631 Anemia in chronic kidney disease: Secondary | ICD-10-CM | POA: Diagnosis not present

## 2023-10-03 DIAGNOSIS — R600 Localized edema: Secondary | ICD-10-CM | POA: Diagnosis not present

## 2023-10-03 DIAGNOSIS — N2581 Secondary hyperparathyroidism of renal origin: Secondary | ICD-10-CM | POA: Diagnosis not present

## 2023-10-03 DIAGNOSIS — N184 Chronic kidney disease, stage 4 (severe): Secondary | ICD-10-CM | POA: Diagnosis not present

## 2023-10-04 DIAGNOSIS — G4733 Obstructive sleep apnea (adult) (pediatric): Secondary | ICD-10-CM | POA: Diagnosis not present

## 2023-10-05 ENCOUNTER — Ambulatory Visit: Payer: PPO | Admitting: Cardiovascular Disease

## 2023-10-07 NOTE — H&P (View-Only) (Signed)
Cardiology Office Note  Date:  10/08/2023   ID:  [name] [name], DOB 1935/03/02, MRN [id number]  PCP:  Dale Centralia, MD   Chief Complaint  Patient presents with   Shortness of Breath    Patient c/o feeling heart beat in ears, extreme fatigue, shortness of breath with little to no activity and bilateral LE edema and has chest heaviness that radiates up into her neck that comes with exertion. Medications reviewed by the patient verbally.     HPI:  [name] [name] is a 88 yo woman with a history of  Ejection fraction 50-55%  Paroxysmal atrial fibrillation, on warfarin Morbid obesity,  symptomatic palpitations/PVCs,  pacemaker implantation for sick sinus syndrome/tachybradycardia syndrome with chronic symptoms of palpitations,  shortness of breath with exertion,  chronic fatigue Obstructive sleep apnea on CPAP Chronic renal failure who presents for f/u of her atrial fibrillation and shortness of breath Sees Dr. Dareen Piano at [facility]  Last seen by myself in clinic October 2023 Seen by EP September 2024 Pacer functioning well  Since march, with SOB, at rest is ok With walking gets tired Little better in sept 24, getting worse again Can't do anything at home Has to sit to wash dishes, sit to shower  Thump in the ears, Poor sleep at night  History of sleep apnea on CPAP  Deveice download today with EP Rhythm change noted Atrial flutter for 37 days, persistent  She reports worsening leg swelling past week  EKG personally reviewed by myself on todays visit EKG Interpretation Date/Time:  Monday October 08 2023 09:21:58 EST Ventricular Rate:  62 PR Interval:    QRS Duration:  198 QT Interval:  534 QTC Calculation: 542 R Axis:   -63  Text Interpretation: Ventricular-paced rhythm When compared with ECG of 17-Aug-2023 15:18, Electronic ventricular pacemaker has replaced Electronic atrial pacemaker underlying rhythm change Confirmed by [name] [phone]) on 10/08/2023 9:33:46  AM atrial flutter noted   Other past medical history reviewed Covid in sept 2023  Echocardiogram May 2022 NORMAL LEFT VENTRICULAR SYSTOLIC FUNCTION  NORMAL RIGHT VENTRICULAR SYSTOLIC FUNCTION  NO VALVULAR STENOSIS  MILD to MODERATE MR  MODERATE TR  MILD PR  EF 55-60%  Closest EF: >55% (Estimated)  Size: MILDLY ENLARGED  Mitral: MILD MR  Tricuspid: MODERATE TR  RV Masses: CATHETER IN RV   Stress test October 2020, Myoview No significant ischemia, normal ejection fraction  History of sleep disorder, on CPAP   PMH:   has a past medical history of 1st degree AV block, A-fib (HCC), Anemia, Anxiety, Arthritis, Chest pain, Chronic diastolic heart failure (HCC), Chronic kidney disease, Congestive heart disease (HCC), Esophageal reflux, Gout, H/O hiatal hernia, History of UTI, Hyperlipidemia, Morbid obesity (HCC), OSA on CPAP, Pacemaker, PAF (paroxysmal atrial fibrillation) (HCC), Seasonal allergies, SSS (sick sinus syndrome) (HCC), Symptomatic PVCs, Syncope and collapse, Thyroid disease, Type II diabetes mellitus (HCC), Unspecified essential hypertension, Unspecified glaucoma(365.9), Vertigo, and Wears dentures.  PSH:    Past Surgical History:  Procedure Laterality Date   APPENDECTOMY     CARDIAC CATHETERIZATION     CARDIOVERSION N/A 04/26/2017   Procedure: Cardioversion;  Surgeon: Antonieta Iba, MD;  Location: [facility] ORS;  Service: Cardiovascular;  Laterality: N/A;   CATARACT EXTRACTION W/PHACO Left 07/21/2020   Procedure: CATARACT EXTRACTION PHACO AND INTRAOCULAR LENS PLACEMENT (IOC) LEFT DIABETIC;  Surgeon: Lockie Mola, MD;  Location: [facility];  Service: Ophthalmology;  Laterality: Left;  10.25 1:13.2 14.0%   CATARACT EXTRACTION W/PHACO Right 08/11/2020   Procedure:  CATARACT EXTRACTION PHACO AND INTRAOCULAR LENS PLACEMENT (IOC) RIGHT DIABETIC;  Surgeon: Lockie Mola, MD;  Location: [facility];  Service: Ophthalmology;  Laterality: Right;   11.63 1:25.0 13.7%   DILATION AND CURETTAGE OF UTERUS     INCISION AND DRAINAGE OF WOUND Right 03/1998   "leg; it was like a boil" (12/01/2013)   INSERT / REPLACE / REMOVE PACEMAKER  2008; 12/01/2013   MDT ADDRL1 pacemaker - gen change by Dr Graciela Husbands 12/01/2013   LUNG BIOPSY  2010   [facility]   PERMANENT PACEMAKER GENERATOR CHANGE N/A 12/01/2013   Procedure: PERMANENT PACEMAKER GENERATOR CHANGE;  Surgeon: Duke Salvia, MD;  Location: [facility] C[facility];  Service: Cardiovascular;  Laterality: N/A;   VAGINAL HYSTERECTOMY  1970    Current Outpatient Medications  Medication Sig Dispense Refill   acetaminophen (TYLENOL) 500 MG tablet Take 1,000 mg by mouth every 6 (six) hours as needed for moderate pain.      allopurinol (ZYLOPRIM) 100 MG tablet Take 1 tablet by mouth daily.     amiodarone (PACERONE) 200 MG tablet TAKE 1 TABLET BY MOUTH EVERY DAY 90 tablet 0   amLODipine (NORVASC) 5 MG tablet Take 5 mg by mouth daily.     atorvastatin (LIPITOR) 20 MG tablet TAKE 1 TABLET (20 MG TOTAL) BY MOUTH DAILY. 90 tablet 0   Blood Glucose Monitoring Suppl (FIFTY50 GLUCOSE METER 2.0) w/Device KIT      calcitRIOL (ROCALTROL) 0.25 MCG capsule TAKE 1 CAPSULE (0.25 MCG TOTAL) BY MOUTH 1 (ONE) TIME EACH DAY     carvedilol (COREG) 25 MG tablet Take 1-2 tablets (25-50 mg total) by mouth as directed. Take 1 tablet (25MG ) by mouth every morning and 2 tablets (50MG ) by mouth every night before bed 270 tablet 3   ferrous sulfate 325 (65 FE) MG EC tablet Take 325 mg by mouth daily.      furosemide (LASIX) 20 MG tablet Take 40 mg by mouth daily.     glipiZIDE (GLUCOTROL) 10 MG tablet Take 10 mg by mouth in the morning.     Glycerin, Laxative, (FLEET LIQUID GLYCERIN SUPP RE) Place 1 Dose rectally daily as needed (constipation).     KLOR-CON M10 10 MEQ tablet Take 1 tablet by mouth daily.  3   meclizine (ANTIVERT) 25 MG tablet Take 1 tablet by mouth 3 (three) times daily as needed.     Multiple Vitamins-Minerals (CENTRUM SILVER PO)  Take 1 tablet by mouth daily.      OneTouch Delica Lancets 33G MISC      ONETOUCH VERIO test strip 3 (three) times daily.     pantoprazole (PROTONIX) 40 MG tablet Take 1 tablet by mouth daily.     polyethylene glycol (MIRALAX / GLYCOLAX) packet Take 17 g by mouth daily as needed for moderate constipation.     sitaGLIPtin (JANUVIA) 25 MG tablet Take 25 mg by mouth daily.     warfarin (COUMADIN) 2 MG tablet Take 2 mg by mouth daily.     glipiZIDE (GLUCOTROL) 10 MG tablet TAKE 1 TABLET (10 MG TOTAL) BY MOUTH 2 (TWO) TIMES DAILY BEFORE A MEAL. (Patient not taking: Reported on 10/08/2023) 180 tablet 0   No current facility-administered medications for this visit.    Allergies:   Metoprolol, Diltiazem, and Levofloxacin   Social History:  The patient  reports that she quit smoking about 34 years ago. Her smoking use included cigarettes. She started smoking about 68 years ago. She has a 17 pack-year smoking history.  She has never used smokeless tobacco. She reports that she does not drink alcohol and does not use drugs.   Family History:   family history includes Allergies in her father; Breast cancer in her maternal aunt; Heart disease in her father; Hypertension in her sister; Liver cancer in her mother; Pancreatic cancer in her mother; Rheum arthritis in her father and sister.   Review of Systems: Review of Systems  Constitutional: Negative.   HENT: Negative.    Respiratory:  Positive for shortness of breath.   Cardiovascular:  Positive for leg swelling.  Gastrointestinal: Negative.   Musculoskeletal:  Positive for joint pain.  Neurological: Negative.   Psychiatric/Behavioral: Negative.    All other systems reviewed and are negative.   PHYSICAL EXAM: VS:  BP (!) 120/58 (BP Location: Left Arm, Patient Position: Sitting, Cuff Size: Large)   Pulse 62   Ht 5\' 4"  (1.626 m)   Wt 247 lb 2 oz (112.1 kg)   SpO2 98%   BMI 42.42 kg/m  , BMI Body mass index is 42.42 kg/m. Constitutional:   oriented to person, place, and time. No distress.  HENT:  Head: Grossly normal Eyes:  no discharge. No scleral icterus.  Neck: No JVD, no carotid bruits  Cardiovascular: Regular rate and rhythm, no murmurs appreciated Pulmonary/Chest: Clear to auscultation bilaterally, no wheezes or rails Abdominal: Soft.  no distension.  no tenderness.  Musculoskeletal: Normal range of motion Neurological:  normal muscle tone. Coordination normal. No atrophy Skin: Skin warm and dry Psychiatric: normal affect, pleasant  Recent Labs: 03/28/2023: B Natriuretic Peptide 345.0 06/29/2023: Hemoglobin 11.8; Platelets 142 08/17/2023: TSH 0.615    Lipid Panel Lab Results  Component Value Date   CHOL 156 11/01/2016   HDL 58 11/01/2016   LDLCALC 79 11/01/2016   TRIG 93 11/01/2016      Wt Readings from Last 3 Encounters:  10/08/23 247 lb 2 oz (112.1 kg)  08/31/23 244 lb 8 oz (110.9 kg)  08/17/23 244 lb 3.2 oz (110.8 kg)     ASSESSMENT AND PLAN:  Paroxysmal atrial fibrillation (HCC) -  Continue amiodarone 200 daily with carvedilol On warfarin,  Converting to atrial flutter 37 days ago, persistent with worsening shortness of breath, thumping in her head and chest Recommended cardioversion, this will be set up for next week, limitation on scheduling secondary to disabled son giving her a ride Need to hold Januvia 3 days prior to procedure  Sick sinus syndrome, pacer Followed by EP, pacer working well In atrial flutter with associated symptoms, management as above  Hyperlipidemia, unspecified hyperlipidemia type On statin, lipitior 20 daily  Acute on chronic diastolic CHF (congestive heart failure) (HCC) euvolemic , Creatinine 2.5, stable Leg swelling worse in the past week likely from atrial flutter  Controlled type 2 diabetes mellitus without complication, without long-term current use of insulin (HCC) A1C 7.0 improved  DYSPNEA History of morbid obesity, chronic diastolic CHF on Lasix  daily No regular exercise program   anemia Previously noted to be a chronic issue, previous iron infusion Hemoglobin 10.3, typically runs 10 up to 11 Iron studies stable  Orders Placed This Encounter  Procedures   EKG 12-Lead     Signed, Dossie Arbour, M.D., Ph.D. 10/08/2023  [facility] [place], [place] [phone]

## 2023-10-07 NOTE — Progress Notes (Unsigned)
Cardiology Office Note  Date:  10/08/2023   ID:  [name] [name], DOB 01-Sep-1935, MRN [id number]  PCP:  Dale Bl[place], MD   Chief Complaint  Patient presents with   Shortness of Breath    Patient c/o feeling heart beat in ears, extreme fatigue, shortness of breath with little to no activity and bilateral LE edema and has chest heaviness that radiates up into her neck that comes with exertion. Medications reviewed by the patient verbally.     HPI:  [name] [name] is a 88 yo woman with a history of  Ejection fraction 50-55%  Paroxysmal atrial fibrillation, on warfarin Morbid obesity,  symptomatic palpitations/PVCs,  pacemaker implantation for sick sinus syndrome/tachybradycardia syndrome with chronic symptoms of palpitations,  shortness of breath with exertion,  chronic fatigue Obstructive sleep apnea on CPAP Chronic renal failure who presents for f/u of her atrial fibrillation and shortness of breath Sees Dr. Dareen Piano at [facility]  Last seen by myself in clinic October 2023 Seen by EP September 2024 Pacer functioning well  Since march, with SOB, at rest is ok With walking gets tired Little better in sept 24, getting worse again Can't do anything at home Has to sit to wash dishes, sit to shower  Thump in the ears, Poor sleep at night  History of sleep apnea on CPAP  Deveice download today with EP Rhythm change noted Atrial flutter for 37 days, persistent  She reports worsening leg swelling past week  EKG personally reviewed by myself on todays visit EKG Interpretation Date/Time:  Monday October 08 2023 09:21:58 EST Ventricular Rate:  62 PR Interval:    QRS Duration:  198 QT Interval:  534 QTC Calculation: 542 R Axis:   -63  Text Interpretation: Ventricular-paced rhythm When compared with ECG of 17-Aug-2023 15:18, Electronic ventricular pacemaker has replaced Electronic atrial pacemaker underlying rhythm change Confirmed by [name] [id number]) on 10/08/2023 9:33:46  AM atrial flutter noted   Other past medical history reviewed Covid in sept 2023  Echocardiogram May 2022 NORMAL LEFT VENTRICULAR SYSTOLIC FUNCTION  NORMAL RIGHT VENTRICULAR SYSTOLIC FUNCTION  NO VALVULAR STENOSIS  MILD to MODERATE MR  MODERATE TR  MILD PR  EF 55-60%  Closest EF: >55% (Estimated)  Size: MILDLY ENLARGED  Mitral: MILD MR  Tricuspid: MODERATE TR  RV Masses: CATHETER IN RV   Stress test October 2020, Myoview No significant ischemia, normal ejection fraction  History of sleep disorder, on CPAP   PMH:   has a past medical history of 1st degree AV block, A-fib (HCC), Anemia, Anxiety, Arthritis, Chest pain, Chronic diastolic heart failure (HCC), Chronic kidney disease, Congestive heart disease (HCC), Esophageal reflux, Gout, H/O hiatal hernia, History of UTI, Hyperlipidemia, Morbid obesity (HCC), OSA on CPAP, Pacemaker, PAF (paroxysmal atrial fibrillation) (HCC), Seasonal allergies, SSS (sick sinus syndrome) (HCC), Symptomatic PVCs, Syncope and collapse, Thyroid disease, Type II diabetes mellitus (HCC), Unspecified essential hypertension, Unspecified glaucoma(365.9), Vertigo, and Wears dentures.  PSH:    Past Surgical History:  Procedure Laterality Date   APPENDECTOMY     CARDIAC CATHETERIZATION     CARDIOVERSION N/A 04/26/2017   Procedure: Cardioversion;  Surgeon: Antonieta Iba, MD;  Location: [facility] ORS;  Service: Cardiovascular;  Laterality: N/A;   CATARACT EXTRACTION W/PHACO Left 07/21/2020   Procedure: CATARACT EXTRACTION PHACO AND INTRAOCULAR LENS PLACEMENT (IOC) LEFT DIABETIC;  Surgeon: Lockie Mola, MD;  Location: [facility];  Service: Ophthalmology;  Laterality: Left;  10.25 1:13.2 14.0%   CATARACT EXTRACTION W/PHACO Right 08/11/2020   Procedure:  CATARACT EXTRACTION PHACO AND INTRAOCULAR LENS PLACEMENT (IOC) RIGHT DIABETIC;  Surgeon: Lockie Mola, MD;  Location: [facility];  Service: Ophthalmology;  Laterality: Right;   11.63 1:25.0 13.7%   DILATION AND CURETTAGE OF UTERUS     INCISION AND DRAINAGE OF WOUND Right 03/1998   "leg; it was like a boil" (12/01/2013)   INSERT / REPLACE / REMOVE PACEMAKER  2008; 12/01/2013   MDT ADDRL1 pacemaker - gen change by Dr Graciela Husbands 12/01/2013   LUNG BIOPSY  2010   [facility]   PERMANENT PACEMAKER GENERATOR CHANGE N/A 12/01/2013   Procedure: PERMANENT PACEMAKER GENERATOR CHANGE;  Surgeon: Duke Salvia, MD;  Location: [facility];  Service: Cardiovascular;  Laterality: N/A;   VAGINAL HYSTERECTOMY  1970    Current Outpatient Medications  Medication Sig Dispense Refill   acetaminophen (TYLENOL) 500 MG tablet Take 1,000 mg by mouth every 6 (six) hours as needed for moderate pain.      allopurinol (ZYLOPRIM) 100 MG tablet Take 1 tablet by mouth daily.     amiodarone (PACERONE) 200 MG tablet TAKE 1 TABLET BY MOUTH EVERY DAY 90 tablet 0   amLODipine (NORVASC) 5 MG tablet Take 5 mg by mouth daily.     atorvastatin (LIPITOR) 20 MG tablet TAKE 1 TABLET (20 MG TOTAL) BY MOUTH DAILY. 90 tablet 0   Blood Glucose Monitoring Suppl (FIFTY50 GLUCOSE METER 2.0) w/Device KIT      calcitRIOL (ROCALTROL) 0.25 MCG capsule TAKE 1 CAPSULE (0.25 MCG TOTAL) BY MOUTH 1 (ONE) TIME EACH DAY     carvedilol (COREG) 25 MG tablet Take 1-2 tablets (25-50 mg total) by mouth as directed. Take 1 tablet (25MG ) by mouth every morning and 2 tablets (50MG ) by mouth every night before bed 270 tablet 3   ferrous sulfate 325 (65 FE) MG EC tablet Take 325 mg by mouth daily.      furosemide (LASIX) 20 MG tablet Take 40 mg by mouth daily.     glipiZIDE (GLUCOTROL) 10 MG tablet Take 10 mg by mouth in the morning.     Glycerin, Laxative, (FLEET LIQUID GLYCERIN SUPP RE) Place 1 Dose rectally daily as needed (constipation).     KLOR-CON M10 10 MEQ tablet Take 1 tablet by mouth daily.  3   meclizine (ANTIVERT) 25 MG tablet Take 1 tablet by mouth 3 (three) times daily as needed.     Multiple Vitamins-Minerals (CENTRUM SILVER PO)  Take 1 tablet by mouth daily.      OneTouch Delica Lancets 33G MISC      ONETOUCH VERIO test strip 3 (three) times daily.     pantoprazole (PROTONIX) 40 MG tablet Take 1 tablet by mouth daily.     polyethylene glycol (MIRALAX / GLYCOLAX) packet Take 17 g by mouth daily as needed for moderate constipation.     sitaGLIPtin (JANUVIA) 25 MG tablet Take 25 mg by mouth daily.     warfarin (COUMADIN) 2 MG tablet Take 2 mg by mouth daily.     glipiZIDE (GLUCOTROL) 10 MG tablet TAKE 1 TABLET (10 MG TOTAL) BY MOUTH 2 (TWO) TIMES DAILY BEFORE A MEAL. (Patient not taking: Reported on 10/08/2023) 180 tablet 0   No current facility-administered medications for this visit.    Allergies:   Metoprolol, Diltiazem, and Levofloxacin   Social History:  The patient  reports that she quit smoking about 34 years ago. Her smoking use included cigarettes. She started smoking about 68 years ago. She has a 17 pack-year smoking history.  She has never used smokeless tobacco. She reports that she does not drink alcohol and does not use drugs.   Family History:   family history includes Allergies in her father; Breast cancer in her maternal aunt; Heart disease in her father; Hypertension in her sister; Liver cancer in her mother; Pancreatic cancer in her mother; Rheum arthritis in her father and sister.   Review of Systems: Review of Systems  Constitutional: Negative.   HENT: Negative.    Respiratory:  Positive for shortness of breath.   Cardiovascular:  Positive for leg swelling.  Gastrointestinal: Negative.   Musculoskeletal:  Positive for joint pain.  Neurological: Negative.   Psychiatric/Behavioral: Negative.    All other systems reviewed and are negative.   PHYSICAL EXAM: VS:  BP (!) 120/58 (BP Location: Left Arm, Patient Position: Sitting, Cuff Size: Large)   Pulse 62   Ht 5\' 4"  (1.626 m)   Wt 247 lb 2 oz (112.1 kg)   SpO2 98%   BMI 42.42 kg/m  , BMI Body mass index is 42.42 kg/m. Constitutional:   oriented to person, place, and time. No distress.  HENT:  Head: Grossly normal Eyes:  no discharge. No scleral icterus.  Neck: No JVD, no carotid bruits  Cardiovascular: Regular rate and rhythm, no murmurs appreciated Pulmonary/Chest: Clear to auscultation bilaterally, no wheezes or rails Abdominal: Soft.  no distension.  no tenderness.  Musculoskeletal: Normal range of motion Neurological:  normal muscle tone. Coordination normal. No atrophy Skin: Skin warm and dry Psychiatric: normal affect, pleasant  Recent Labs: 03/28/2023: B Natriuretic Peptide 345.0 06/29/2023: Hemoglobin 11.8; Platelets 142 08/17/2023: TSH 0.615    Lipid Panel Lab Results  Component Value Date   CHOL 156 11/01/2016   HDL 58 11/01/2016   LDLCALC 79 11/01/2016   TRIG 93 11/01/2016      Wt Readings from Last 3 Encounters:  10/08/23 247 lb 2 oz (112.1 kg)  08/31/23 244 lb 8 oz (110.9 kg)  08/17/23 244 lb 3.2 oz (110.8 kg)     ASSESSMENT AND PLAN:  Paroxysmal atrial fibrillation (HCC) -  Continue amiodarone 200 daily with carvedilol On warfarin,  Converting to atrial flutter 37 days ago, persistent with worsening shortness of breath, thumping in her head and chest Recommended cardioversion, this will be set up for next week, limitation on scheduling secondary to disabled son giving her a ride Need to hold Januvia 3 days prior to procedure  Sick sinus syndrome, pacer Followed by EP, pacer working well In atrial flutter with associated symptoms, management as above  Hyperlipidemia, unspecified hyperlipidemia type On statin, lipitior 20 daily  Acute on chronic diastolic CHF (congestive heart failure) (HCC) euvolemic , Creatinine 2.5, stable Leg swelling worse in the past week likely from atrial flutter  Controlled type 2 diabetes mellitus without complication, without long-term current use of insulin (HCC) A1C 7.0 improved  DYSPNEA History of morbid obesity, chronic diastolic CHF on Lasix  daily No regular exercise program   anemia Previously noted to be a chronic issue, previous iron infusion Hemoglobin 10.3, typically runs 10 up to 11 Iron studies stable  Orders Placed This Encounter  Procedures   EKG 12-Lead     Signed, Dossie Arbour, M.D., Ph.D. 10/08/2023  [facility] [place], [place] [phone]

## 2023-10-08 ENCOUNTER — Ambulatory Visit: Payer: PPO | Attending: Cardiovascular Disease | Admitting: Cardiovascular Disease

## 2023-10-08 ENCOUNTER — Encounter: Payer: Self-pay | Admitting: Cardiovascular Disease

## 2023-10-08 ENCOUNTER — Telehealth: Payer: Self-pay | Admitting: Cardiovascular Disease

## 2023-10-08 VITALS — BP 120/58 | HR 62 | Ht 64.0 in | Wt 247.1 lb

## 2023-10-08 DIAGNOSIS — I5032 Chronic diastolic (congestive) heart failure: Secondary | ICD-10-CM

## 2023-10-08 DIAGNOSIS — R0602 Shortness of breath: Secondary | ICD-10-CM | POA: Diagnosis not present

## 2023-10-08 DIAGNOSIS — Z950 Presence of cardiac pacemaker: Secondary | ICD-10-CM

## 2023-10-08 DIAGNOSIS — I480 Paroxysmal atrial fibrillation: Secondary | ICD-10-CM

## 2023-10-08 DIAGNOSIS — Z79899 Other long term (current) drug therapy: Secondary | ICD-10-CM | POA: Diagnosis not present

## 2023-10-08 DIAGNOSIS — I38 Endocarditis, valve unspecified: Secondary | ICD-10-CM | POA: Diagnosis not present

## 2023-10-08 DIAGNOSIS — I10 Essential (primary) hypertension: Secondary | ICD-10-CM

## 2023-10-08 DIAGNOSIS — E119 Type 2 diabetes mellitus without complications: Secondary | ICD-10-CM

## 2023-10-08 DIAGNOSIS — I495 Sick sinus syndrome: Secondary | ICD-10-CM | POA: Diagnosis not present

## 2023-10-08 DIAGNOSIS — E782 Mixed hyperlipidemia: Secondary | ICD-10-CM

## 2023-10-08 NOTE — Patient Instructions (Signed)
Cardioversion for atrial flutter  Medication Instructions:  No changes  If you need a refill on your cardiac medications before your next appointment, please call your pharmacy.   Lab work: Your provider would like for you to return Tuesday 10/16/23 to have the following labs drawn: CBC, BMP and PT/INR.   Please go to [facility] Check in at the first desk. You do not need an appointment.  They are open from 8:00 You will not need to be fasting.        Testing/Procedures: You are scheduled for a Cardioversion on 10/17/23 with Dr. Mariah Milling.    Please arrive at the [facility]tr[facility] of [facility], [address], [place] [address] at 06:30 am/pm (This is 1 hour prior to your procedure time).  Proceed to the Check-In Desk directly inside the entrance.  Procedure Parking: Use the entrance off of the [address] side of the hospital. Turn right upon entering and follow the driveway to parking that is directly in front of the [facility]. There is no valet parking available at this entrance, however there is an awning directly in front of the [facility] for drop off/ pick up for patients  DIET: Nothing to eat or drink after midnight except a sip of water with medications (see medication instructions below)  Medication Instructions: Hold Lasix 40 MG and Glipizide 10 MG the morning of your procedure.  Hold Sitagliptin (Januvia) 25 MG for three days. Last dose will be Saturday 10/13/23. Continue your anticoagulant: Warfarin If you miss a dose, please call us at [phone] You will need to continue your anticoagulant after your procedure until you are told by your provider that it is safe to stop.    FYI: For your safety, and to allow [place] to monitor your vital signs accurately during the surgery/procedure we request that if you have artificial nails, gel coating, SNS etc. Please have those removed prior to your surgery/procedure. Not  having the nail coverings /polish removed may result in cancellation or delay of your surgery/procedure.  You must have a responsible person to drive you home and stay in the waiting area during your procedure. Failure to do so could result in cancellation.  Bring your insurance cards.  If you have any questions after you get home, please call the office at [phone]  *Special Note: Every effort is made to have your procedure done on time. Occasionally there are emergencies that occur at the hospital that may cause delays. Please be patient if a delay does occur.       Follow-Up: At [facility], you and your health needs are our priority.  As part of our continuing mission to provide you with exceptional heart care, we have created designated Provider Care Teams.  These Care Teams include your primary Cardiologist (physician) and Advanced Practice Providers (APPs -  Physician Assistants and Nurse Practitioners) who all work together to provide you with the care you need, when you need it.  You will need a follow up appointment in 1 months  Providers on your designated Care Team:   Nicolasa Ducking, NP Eula Listen, PA-C Cadence Fransico Michael, [place]  COVID-19 Vaccine Information can be found at: [email or web address] For questions related to vaccine distribution or appointments, please email [email or web address] or call [phone].

## 2023-10-08 NOTE — Telephone Encounter (Signed)
Called patient and left a message for call back  

## 2023-10-08 NOTE — Telephone Encounter (Signed)
Patient is requesting to speak with a nurse in regards to going over information from her appt today. Please advise.

## 2023-10-10 NOTE — Progress Notes (Signed)
Remote pacemaker transmission.   

## 2023-10-11 ENCOUNTER — Ambulatory Visit: Payer: PPO | Admitting: Internal Medicine

## 2023-10-11 ENCOUNTER — Encounter: Payer: Self-pay | Admitting: Internal Medicine

## 2023-10-11 VITALS — BP 122/68 | HR 76 | Temp 98.2°F | Resp 16 | Ht 64.0 in | Wt 244.4 lb

## 2023-10-11 DIAGNOSIS — Z23 Encounter for immunization: Secondary | ICD-10-CM

## 2023-10-11 DIAGNOSIS — D582 Other hemoglobinopathies: Secondary | ICD-10-CM

## 2023-10-11 DIAGNOSIS — E042 Nontoxic multinodular goiter: Secondary | ICD-10-CM

## 2023-10-11 DIAGNOSIS — G4733 Obstructive sleep apnea (adult) (pediatric): Secondary | ICD-10-CM

## 2023-10-11 DIAGNOSIS — N183 Chronic kidney disease, stage 3 unspecified: Secondary | ICD-10-CM

## 2023-10-11 DIAGNOSIS — E782 Mixed hyperlipidemia: Secondary | ICD-10-CM | POA: Diagnosis not present

## 2023-10-11 DIAGNOSIS — I480 Paroxysmal atrial fibrillation: Secondary | ICD-10-CM

## 2023-10-11 DIAGNOSIS — E119 Type 2 diabetes mellitus without complications: Secondary | ICD-10-CM

## 2023-10-11 DIAGNOSIS — D631 Anemia in chronic kidney disease: Secondary | ICD-10-CM

## 2023-10-11 DIAGNOSIS — I5032 Chronic diastolic (congestive) heart failure: Secondary | ICD-10-CM

## 2023-10-11 DIAGNOSIS — Z7901 Long term (current) use of anticoagulants: Secondary | ICD-10-CM

## 2023-10-11 DIAGNOSIS — N2581 Secondary hyperparathyroidism of renal origin: Secondary | ICD-10-CM

## 2023-10-11 DIAGNOSIS — I10 Essential (primary) hypertension: Secondary | ICD-10-CM

## 2023-10-11 DIAGNOSIS — Z1231 Encounter for screening mammogram for malignant neoplasm of breast: Secondary | ICD-10-CM

## 2023-10-11 DIAGNOSIS — N184 Chronic kidney disease, stage 4 (severe): Secondary | ICD-10-CM | POA: Diagnosis not present

## 2023-10-11 NOTE — Progress Notes (Signed)
Subjective:    Patient ID: [name] [name], female    DOB: [birth year], 88 y.o.   MRN: [id number]  Patient here for  Chief Complaint  Patient presents with   Establish Care    HPI Here to establish care. Has a history of afib, diabetes, PVCs, sick sinus syndrome s/p pacemaker placement, OSA on cpap and chronic kidney disease. Seeing cardiology - last saw Dr Mariah Milling 10/08/23.  Recommended continuing amiodarone with carvedilol.  On coumadin. INR last check 2.5. takes 2mg  coumadin q day.  Converted recently to atrial flutter - planning cardioversion. Saw Dr Suezanne Jacquet for f/u CKD - stage IV. Recommended f/u ultrasound next visit. Continues on benicar. Has been seeing Dr Gershon Crane for her diabetes and f/u multinodular goiter. She has been having some intermittent low sugars.  Has been intermittently taking her glipizide.  We discussed remaining off this medication, given the low sugars and her renal function. She did have pancreatitis in the past, so will avoid GLP - 1 agonist. Given her renal function, she is unable to use SGLT-2 and metformin. States am sugar 1112.  Reports blood sugars 140-160 (highest). Regarding her thyroid - has been followed with serial ultrasounds.  Previous biopsy ok. Seeing Dr Smith Robert for anemia.  Documented anemia of CKD - on retacrit. Has also received IV iron. Hgb stable around 10-11. Also has mild stable thrombocytopenia. Last iron studies were not indicative or iron deficiency.  She has been taking iron.  Causes constipation.  Using miralax.    Past Medical History:  Diagnosis Date   1st degree AV block    a. PR 400 msec with Apacing   A-fib (HCC)    Hx of   Anemia    Anxiety    Arthritis    knees and lower back    Chest pain    a. 2004 Cath:  reportedly nl;  b. 09/2013 Neg MV.   Chronic diastolic heart failure (HCC)    a. 09/2013 Echo: EF 50-55%, mild LVH, mild to mod MR/TR.   Chronic kidney disease    Stage IV   Colon polyps    Congestive heart disease (HCC)     Esophageal reflux    Glaucoma    Gout    H/O hiatal hernia    History of UTI    Hyperlipidemia    Morbid obesity (HCC)    OSA on CPAP    CPAP   Pacemaker    copy of medtronic card in chart   PAF (paroxysmal atrial fibrillation) (HCC)    Seasonal allergies    SSS (sick sinus syndrome) (HCC)    a. 2008 s/p MDT PPM;  b. 11/2013 Gen change- MDT ADDRL1 Adapta DC PPM, ser # [id number].   Symptomatic PVCs    Syncope and collapse    a. Felt to be vasovagal.   Thyroid disease    nodules on thyroid, with goiter, on no meds per pt   Type II diabetes mellitus (HCC)    Type 2   Unspecified essential hypertension    controlled on meds   Unspecified glaucoma(365.9)    Vertigo    in past   Wears dentures    upper full and lower partial   Past Surgical History:  Procedure Laterality Date   APPENDECTOMY     CARDIAC CATHETERIZATION     CARDIOVERSION N/A 04/26/2017   Procedure: Cardioversion;  Surgeon: Antonieta Iba, MD;  Location: [facility] ORS;  Service: Cardiovascular;  Laterality: N/A;   CATARACT EXTRACTION  W/PHACO Left 07/21/2020   Procedure: CATARACT EXTRACTION PHACO AND INTRAOCULAR LENS PLACEMENT (IOC) LEFT DIABETIC;  Surgeon: Lockie Mola, MD;  Location: [facility];  Service: Ophthalmology;  Laterality: Left;  10.25 1:13.2 14.0%   CATARACT EXTRACTION W/PHACO Right 08/11/2020   Procedure: CATARACT EXTRACTION PHACO AND INTRAOCULAR LENS PLACEMENT (IOC) RIGHT DIABETIC;  Surgeon: Lockie Mola, MD;  Location: [facility];  Service: Ophthalmology;  Laterality: Right;  11.63 1:25.0 13.7%   DILATION AND CURETTAGE OF UTERUS     INCISION AND DRAINAGE OF WOUND Right 03/1998   "leg; it was like a boil" (12/01/2013)   INSERT / REPLACE / REMOVE PACEMAKER  2008; 12/01/2013   MDT ADDRL1 pacemaker - gen change by Dr Graciela Husbands 12/01/2013   LUNG BIOPSY  2010   [place]   PERMANENT PACEMAKER GENERATOR CHANGE N/A 12/01/2013   Procedure: PERMANENT PACEMAKER GENERATOR CHANGE;   Surgeon: Duke Salvia, MD;  Location: [facility] CATH LAB;  Service: Cardiovascular;  Laterality: N/A;   VAGINAL HYSTERECTOMY  1970   Family History  Problem Relation Age of Onset   Cancer Mother    Liver cancer Mother    Pancreatic cancer Mother    Heart disease Father    Rheum arthritis Father    Allergies Father    Hypertension Sister    Rheum arthritis Sister    COPD Brother    Breast cancer Maternal Aunt    Social History   Socioeconomic History   Marital status: Widowed    Spouse name: Not on file   Number of children: Y   Years of education: Not on file   Highest education level: Not on file  Occupational History   Occupation: retired  Tobacco Use   Smoking status: Former    Current packs/day: 0.00    Average packs/day: 0.5 packs/day for 34.0 years (17.0 ttl pk-yrs)    Types: Cigarettes    Start date: 08/16/1955    Quit date: 08/15/1989    Years since quitting: 34.1   Smokeless tobacco: Never  Vaping Use   Vaping status: Never Used  Substance and Sexual Activity   Alcohol use: No    Alcohol/week: 0.0 standard drinks of alcohol   Drug use: No   Sexual activity: Never  Other Topics Concern   Not on file  Social History Narrative   Retired. Widowed. Regularly exercises.    Social Determinants of Health   Financial Resource Strain: Unknown (12/01/2018)   Overall Financial Resource Strain (CARDIA)    Difficulty of Paying Living Expenses: Patient declined  Food Insecurity: Unknown (12/01/2018)   Hunger Vital Sign    Worried About Running Out of Food in the Last Year: Patient declined    Ran Out of Food in the Last Year: Patient declined  Transportation Needs: Unknown (12/01/2018)   PRAPARE - Administrator, Civil Service (Medical): Patient declined    Lack of Transportation (Non-Medical): Patient declined  Physical Activity: Unknown (12/01/2018)   Exercise Vital Sign    Days of Exercise per Week: Patient declined    Minutes of Exercise per Session:  Patient declined  Stress: Unknown (12/01/2018)   [employer] of Occupational Health - Occupational Stress Questionnaire    Feeling of Stress : Patient declined  Social Connections: Unknown (12/01/2018)   Social Connection and Isolation Panel [NHANES]    Frequency of Communication with Friends and Family: Patient declined    Frequency of Social Gatherings with Friends and Family: Patient declined    Attends Religious Services:  Patient declined    Active Member of Clubs or Organizations: Patient declined    Attends Banker Meetings: Patient declined    Marital Status: Patient declined     Review of Systems  Constitutional:  Negative for appetite change and unexpected weight change.  HENT:  Negative for congestion and sinus pressure.   Respiratory:  Negative for cough and chest tightness.        Breathing stable.   Cardiovascular:  Negative for chest pain and palpitations.  Gastrointestinal:  Positive for constipation. Negative for abdominal pain, diarrhea, nausea and vomiting.  Genitourinary:  Negative for difficulty urinating and dysuria.  Musculoskeletal:  Negative for joint swelling and myalgias.  Skin:  Negative for color change and rash.  Neurological:  Negative for dizziness and headaches.  Psychiatric/Behavioral:  Negative for agitation and dysphoric mood.        Objective:     BP 122/68   Pulse 76   Temp 98.2 F (36.8 C)   Resp 16   Ht 5\' 4"  (1.626 m)   Wt 244 lb 6.4 oz (110.9 kg)   SpO2 98%   BMI 41.95 kg/m  Wt Readings from Last 3 Encounters:  10/11/23 244 lb 6.4 oz (110.9 kg)  10/08/23 247 lb 2 oz (112.1 kg)  08/31/23 244 lb 8 oz (110.9 kg)    Physical Exam Vitals reviewed.  Constitutional:      General: She is not in acute distress.    Appearance: Normal appearance.  HENT:     Head: Normocephalic and atraumatic.     Right Ear: External ear normal.     Left Ear: External ear normal.  Eyes:     General: No scleral icterus.        Right eye: No discharge.        Left eye: No discharge.     Conjunctiva/sclera: Conjunctivae normal.  Neck:     Thyroid: No thyromegaly.  Cardiovascular:     Rate and Rhythm: Normal rate and regular rhythm.  Pulmonary:     Effort: No respiratory distress.     Breath sounds: Normal breath sounds. No wheezing.  Abdominal:     General: Bowel sounds are normal.     Palpations: Abdomen is soft.     Tenderness: There is no abdominal tenderness.  Musculoskeletal:        General: No swelling or tenderness.     Cervical back: Neck supple. No tenderness.  Lymphadenopathy:     Cervical: No cervical adenopathy.  Skin:    Findings: No erythema or rash.  Neurological:     Mental Status: She is alert.  Psychiatric:        Mood and Affect: Mood normal.        Behavior: Behavior normal.      Outpatient Encounter Medications as of 10/11/2023  Medication Sig   acetaminophen (TYLENOL) 500 MG tablet Take 1,000 mg by mouth every 6 (six) hours as needed for moderate pain.    allopurinol (ZYLOPRIM) 100 MG tablet Take 100 mg by mouth daily.   amLODipine (NORVASC) 5 MG tablet Take 5 mg by mouth daily.   atorvastatin (LIPITOR) 20 MG tablet TAKE 1 TABLET (20 MG TOTAL) BY MOUTH DAILY.   Blood Glucose Monitoring Suppl (FIFTY50 GLUCOSE METER 2.0) w/Device KIT    calcitRIOL (ROCALTROL) 0.25 MCG capsule TAKE 1 CAPSULE (0.25 MCG TOTAL) BY MOUTH 1 (ONE) TIME EACH DAY   carvedilol (COREG) 25 MG tablet Take 1-2 tablets (25-50 mg total) by  mouth as directed. Take 1 tablet (25MG ) by mouth every morning and 2 tablets (50MG ) by mouth every night before bed (Patient taking differently: Take 25-50 mg by mouth See admin instructions. Take 1 tablet (25MG ) by mouth every morning and 2 tablets (50MG ) by mouth every night before bed)   ferrous sulfate 325 (65 FE) MG EC tablet Take 325 mg by mouth daily.    furosemide (LASIX) 40 MG tablet Take 40 mg by mouth daily.   Glycerin, Laxative, (FLEET LIQUID GLYCERIN SUPP RE) Place  1 Dose rectally daily as needed (constipation).   KLOR-CON M10 10 MEQ tablet Take 10 mEq by mouth daily.   meclizine (ANTIVERT) 25 MG tablet Take 25 mg by mouth 3 (three) times daily as needed for dizziness or nausea.   Multiple Vitamins-Minerals (CENTRUM SILVER PO) Take 1 tablet by mouth daily.    OneTouch Delica Lancets 33G MISC    ONETOUCH VERIO test strip 3 (three) times daily.   pantoprazole (PROTONIX) 40 MG tablet Take 40 mg by mouth daily.   polyethylene glycol (MIRALAX / GLYCOLAX) packet Take 17 g by mouth daily as needed for moderate constipation.   sitaGLIPtin (JANUVIA) 25 MG tablet Take 25 mg by mouth daily.   warfarin (COUMADIN) 2 MG tablet Take 2 mg by mouth daily at 6 PM.   White Petrolatum-Mineral Oil (ARTIFICIAL TEARS) ointment Place 1 drop into both eyes daily as needed (Dry eyes).   [DISCONTINUED] amiodarone (PACERONE) 200 MG tablet TAKE 1 TABLET BY MOUTH EVERY DAY   [DISCONTINUED] glipiZIDE (GLUCOTROL) 10 MG tablet TAKE 1 TABLET (10 MG TOTAL) BY MOUTH 2 (TWO) TIMES DAILY BEFORE A MEAL. (Patient taking differently: Take 10 mg by mouth daily before breakfast.)   No facility-administered encounter medications on file as of 10/11/2023.     Lab Results  Component Value Date   WBC 6.4 06/29/2023   HGB 11.8 (L) 06/29/2023   HCT 34.5 (L) 06/29/2023   PLT 142 (L) 06/29/2023   GLUCOSE 154 (H) 12/20/2021   CHOL 156 11/01/2016   TRIG 93 11/01/2016   HDL 58 11/01/2016   LDLCALC 79 11/01/2016   ALT 24 12/20/2021   AST 25 12/20/2021   NA 138 12/20/2021   K 4.2 12/20/2021   CL 102 12/20/2021   CREATININE 2.78 (H) 12/20/2021   BUN 31 (H) 12/20/2021   CO2 26 12/20/2021   TSH 0.615 08/17/2023   INR 6.08 (HH) 12/03/2018   HGBA1C 7.2 09/26/2023    NM Myocar Multi W/Spect W/Wall Motion / EF  Result Date: 08/24/2023   The study is normal. The study is low risk.   No ST deviation was noted.   LV perfusion is normal. There is no evidence of ischemia. There is no evidence of  infarction.   Left ventricular function is normal. End diastolic cavity size is normal. End systolic cavity size is normal.   CT attenuation images showed mild aortic calcifications.       Assessment & Plan:  Visit for screening mammogram -     3D Screening Mammogram, Left and Right; Future  Need for influenza vaccination -     Flu Vaccine Trivalent High Dose (Fluad)  Essential hypertension Assessment & Plan: Continues on carvedilol, amlodipine and furosemide.  No changes today.  Follow pressures.    Stage 3 chronic kidney disease, unspecified whether stage 3a or 3b CKD (HCC) Assessment & Plan: Followed by Dr Suezanne Jacquet.    Orders: -     Basic metabolic panel; Future -  TSH; Future  Chronic diastolic heart failure (HCC) Assessment & Plan: No evidence of volume overload on exam today.  Sees Dr Mariah Milling. Continue current medications as outlined.    Mixed hyperlipidemia Assessment & Plan: On lipitor.  Low cholesterol diet and exercise.  Follow lipid panel and liver function tests.   Orders: -     Lipid panel; Future -     Hepatic function panel; Future  Anticoagulation monitoring, INR range 2-3 Assessment & Plan: Has been followed by internal medicine - [facility]. Last INR one week ago 2.5.  follow. Consider established with coumadin clinic.   Orders: -     Protime-INR; Future  Anemia due to chronic renal failure treated with erythropoietin, stage 4 (severe) (HCC) Assessment & Plan: Being followed by Dr Smith Robert.  Last labs did not reveal iron deficiency.  Has been taking oral iron.  Constipation.  D/w Dr Smith Robert - f/u. Has not received IV iron recently.    CKD (chronic kidney disease) stage 4, GFR 15-29 ml/min (HCC) Assessment & Plan: Followed by Dr Suezanne Jacquet.     Controlled type 2 diabetes mellitus without complication, without long-term current use of insulin (HCC) Assessment & Plan: Has been seeing Dr Gershon Crane for her diabetes and f/u multinodular goiter. She has been  having some intermittent low sugars.  Has been intermittently taking her glipizide.  We discussed remaining off this medication, given the low sugars and her renal function. She did have pancreatitis in the past, so will avoid GLP - 1 agonist. Given her renal function, she is unable to use SGLT-2 and metformin. States am sugar 1112.  Reports blood sugars 140-160 (highest). Continue f/u with Dr Gershon Crane.    Hemoglobin C disease (HCC) Assessment & Plan: Followed by hematology.    Multinodular goiter Assessment & Plan: Followed by Dr Gershon Crane - thyroid - has been followed with serial ultrasounds.  Previous biopsy ok.    OSA (obstructive sleep apnea) Assessment & Plan: Continue cpap.    PAF (paroxysmal atrial fibrillation) (HCC) Assessment & Plan: Continues on amiodarone.  Continues on coumadin.  PT/INR as outlined.    Secondary hyperparathyroidism of renal origin [facility]) Assessment & Plan: Followed by Dr Suezanne Jacquet.    I spent 45 minutes with the patient. Time spent discussing her current concerns and symptoms.  Specifically time spent discussing her ongoing medical issues and current concerns with low blood sugar, constipation. Time also spent discussing further w/up, evaluation and treatment.     Dale Reliance, MD

## 2023-10-12 ENCOUNTER — Other Ambulatory Visit: Payer: Self-pay | Admitting: Cardiovascular Disease

## 2023-10-12 ENCOUNTER — Other Ambulatory Visit (HOSPITAL_BASED_OUTPATIENT_CLINIC_OR_DEPARTMENT_OTHER): Payer: Self-pay | Admitting: Cardiology

## 2023-10-12 DIAGNOSIS — I4891 Unspecified atrial fibrillation: Secondary | ICD-10-CM

## 2023-10-12 LAB — HM DIABETES FOOT EXAM

## 2023-10-12 NOTE — Telephone Encounter (Signed)
Called and spoke with patient. Questions answered regarding upcoming cardioversion.

## 2023-10-15 ENCOUNTER — Encounter: Payer: Self-pay | Admitting: Internal Medicine

## 2023-10-15 NOTE — Assessment & Plan Note (Signed)
Has been followed by internal medicine - [facility]. Last INR one week ago 2.5.  follow. Consider established with coumadin clinic.

## 2023-10-15 NOTE — Assessment & Plan Note (Signed)
Followed by Dr Suezanne Jacquet.

## 2023-10-15 NOTE — Assessment & Plan Note (Signed)
Continues on carvedilol, amlodipine and furosemide.  No changes today.  Follow pressures.

## 2023-10-15 NOTE — Assessment & Plan Note (Signed)
Being followed by Dr Smith Robert.  Last labs did not reveal iron deficiency.  Has been taking oral iron.  Constipation.  D/w Dr Smith Robert - f/u. Has not received IV iron recently.

## 2023-10-15 NOTE — Assessment & Plan Note (Signed)
On lipitor.  Low cholesterol diet and exercise.  Follow lipid panel and liver function tests.   

## 2023-10-15 NOTE — Assessment & Plan Note (Signed)
Has been seeing Dr Gershon Crane for her diabetes and f/u multinodular goiter. She has been having some intermittent low sugars.  Has been intermittently taking her glipizide.  We discussed remaining off this medication, given the low sugars and her renal function. She did have pancreatitis in the past, so will avoid GLP - 1 agonist. Given her renal function, she is unable to use SGLT-2 and metformin. States am sugar 1112.  Reports blood sugars 140-160 (highest). Continue f/u with Dr Gershon Crane.

## 2023-10-15 NOTE — Assessment & Plan Note (Signed)
Followed by Dr Gershon Crane - thyroid - has been followed with serial ultrasounds.  Previous biopsy ok.

## 2023-10-15 NOTE — Assessment & Plan Note (Signed)
Continue cpap.  

## 2023-10-15 NOTE — Assessment & Plan Note (Signed)
Followed by hematology 

## 2023-10-15 NOTE — Assessment & Plan Note (Signed)
No evidence of volume overload on exam today.  Sees Dr Mariah Milling. Continue current medications as outlined.

## 2023-10-15 NOTE — Assessment & Plan Note (Signed)
Continues on amiodarone.  Continues on coumadin.  PT/INR as outlined.

## 2023-10-16 ENCOUNTER — Telehealth: Payer: Self-pay | Admitting: Internal Medicine

## 2023-10-16 ENCOUNTER — Other Ambulatory Visit
Admission: RE | Admit: 2023-10-16 | Discharge: 2023-10-16 | Disposition: A | Discharge status: Home / Self Care | Payer: PPO | Source: Home / Self Care | Attending: Cardiovascular Disease | Admitting: Cardiovascular Disease

## 2023-10-16 DIAGNOSIS — Z79899 Other long term (current) drug therapy: Secondary | ICD-10-CM | POA: Insufficient documentation

## 2023-10-16 DIAGNOSIS — R0602 Shortness of breath: Secondary | ICD-10-CM | POA: Diagnosis not present

## 2023-10-16 DIAGNOSIS — I480 Paroxysmal atrial fibrillation: Secondary | ICD-10-CM | POA: Diagnosis not present

## 2023-10-16 LAB — CBC
HCT: 32.6 % — ABNORMAL LOW (ref 36.0–46.0)
Hemoglobin: 11 g/dL — ABNORMAL LOW (ref 12.0–15.0)
MCH: 27 pg (ref 26.0–34.0)
MCHC: 33.7 g/dL (ref 30.0–36.0)
MCV: 80.1 fL (ref 80.0–100.0)
Platelets: 138 10*3/uL — ABNORMAL LOW (ref 150–400)
RBC: 4.07 MIL/uL (ref 3.87–5.11)
RDW: 15.7 % — ABNORMAL HIGH (ref 11.5–15.5)
WBC: 5.4 10*3/uL (ref 4.0–10.5)
nRBC: 0 % (ref 0.0–0.2)

## 2023-10-16 LAB — BASIC METABOLIC PANEL
Anion gap: 8 (ref 5–15)
BUN: 25 mg/dL — ABNORMAL HIGH (ref 8–23)
CO2: 27 mmol/L (ref 22–32)
Calcium: 9.2 mg/dL (ref 8.9–10.3)
Chloride: 103 mmol/L (ref 98–111)
Creatinine, Ser: 2.63 mg/dL — ABNORMAL HIGH (ref 0.44–1.00)
GFR, Estimated: 17 mL/min — ABNORMAL LOW (ref >60)
Glucose, Bld: 151 mg/dL — ABNORMAL HIGH (ref 70–99)
Potassium: 3.8 mmol/L (ref 3.5–5.1)
Sodium: 138 mmol/L (ref 135–145)

## 2023-10-16 LAB — PROTIME-INR
INR: 2.2 — ABNORMAL HIGH (ref 0.8–1.2)
Prothrombin Time: 25 s — ABNORMAL HIGH (ref 11.4–15.2)

## 2023-10-16 NOTE — Telephone Encounter (Signed)
Medication was refilled by Dr Lalla Brothers 11/8. Tried to call patient and let her know. Unable to get through. Called her niece and she is aware.

## 2023-10-16 NOTE — Telephone Encounter (Signed)
Patient just called and said she is out of her prescription. The name is amiodarone (PACERONE) 200 MG tablet. The pharmacy she use is [id number] [facility],  - [address] [place], [place] [address] Phone: [phone]  Fax: [phone]  Her number is [phone]

## 2023-10-17 ENCOUNTER — Encounter: Payer: Self-pay | Admitting: Cardiovascular Disease

## 2023-10-17 ENCOUNTER — Ambulatory Visit: Payer: Self-pay | Admitting: Anesthesiology

## 2023-10-17 ENCOUNTER — Ambulatory Visit: Payer: PPO | Admitting: Anesthesiology

## 2023-10-17 ENCOUNTER — Ambulatory Visit (HOSPITAL_BASED_OUTPATIENT_CLINIC_OR_DEPARTMENT_OTHER)
Admission: RE | Admit: 2023-10-17 | Discharge: 2023-10-17 | Disposition: A | Discharge status: Home / Self Care | Payer: PPO | Source: Home / Self Care | Attending: Cardiovascular Disease | Admitting: Cardiovascular Disease

## 2023-10-17 ENCOUNTER — Encounter
Admission: RE | Disposition: A | Discharge status: Home / Self Care | Payer: Self-pay | Source: Home / Self Care | Attending: Cardiovascular Disease

## 2023-10-17 DIAGNOSIS — I4891 Unspecified atrial fibrillation: Secondary | ICD-10-CM

## 2023-10-17 DIAGNOSIS — I5032 Chronic diastolic (congestive) heart failure: Secondary | ICD-10-CM | POA: Diagnosis not present

## 2023-10-17 DIAGNOSIS — E1122 Type 2 diabetes mellitus with diabetic chronic kidney disease: Secondary | ICD-10-CM | POA: Diagnosis not present

## 2023-10-17 DIAGNOSIS — I4892 Unspecified atrial flutter: Secondary | ICD-10-CM | POA: Insufficient documentation

## 2023-10-17 DIAGNOSIS — G4733 Obstructive sleep apnea (adult) (pediatric): Secondary | ICD-10-CM | POA: Insufficient documentation

## 2023-10-17 DIAGNOSIS — I130 Hypertensive heart and chronic kidney disease with heart failure and stage 1 through stage 4 chronic kidney disease, or unspecified chronic kidney disease: Secondary | ICD-10-CM | POA: Insufficient documentation

## 2023-10-17 DIAGNOSIS — N184 Chronic kidney disease, stage 4 (severe): Secondary | ICD-10-CM | POA: Insufficient documentation

## 2023-10-17 DIAGNOSIS — I480 Paroxysmal atrial fibrillation: Secondary | ICD-10-CM | POA: Insufficient documentation

## 2023-10-17 DIAGNOSIS — Z7901 Long term (current) use of anticoagulants: Secondary | ICD-10-CM | POA: Insufficient documentation

## 2023-10-17 DIAGNOSIS — I440 Atrioventricular block, first degree: Secondary | ICD-10-CM | POA: Insufficient documentation

## 2023-10-17 DIAGNOSIS — Z7984 Long term (current) use of oral hypoglycemic drugs: Secondary | ICD-10-CM | POA: Insufficient documentation

## 2023-10-17 DIAGNOSIS — I495 Sick sinus syndrome: Secondary | ICD-10-CM | POA: Insufficient documentation

## 2023-10-17 DIAGNOSIS — Z6841 Body Mass Index (BMI) 40.0 and over, adult: Secondary | ICD-10-CM | POA: Insufficient documentation

## 2023-10-17 DIAGNOSIS — K219 Gastro-esophageal reflux disease without esophagitis: Secondary | ICD-10-CM | POA: Insufficient documentation

## 2023-10-17 DIAGNOSIS — Z87891 Personal history of nicotine dependence: Secondary | ICD-10-CM | POA: Insufficient documentation

## 2023-10-17 DIAGNOSIS — Z950 Presence of cardiac pacemaker: Secondary | ICD-10-CM | POA: Insufficient documentation

## 2023-10-17 DIAGNOSIS — D631 Anemia in chronic kidney disease: Secondary | ICD-10-CM | POA: Diagnosis not present

## 2023-10-17 HISTORY — PX: CARDIOVERSION: SHX1299

## 2023-10-17 LAB — GLUCOSE, CAPILLARY: Glucose-Capillary: 163 mg/dL — ABNORMAL HIGH (ref 70–99)

## 2023-10-17 SURGERY — CARDIOVERSION
Anesthesia: General

## 2023-10-17 MED ORDER — PROPOFOL 10 MG/ML IV BOLUS
INTRAVENOUS | Status: DC | PRN
  Administered 2023-10-17: 40 mg via INTRAVENOUS

## 2023-10-17 MED ORDER — SODIUM CHLORIDE 0.9% FLUSH: 10.0000 mL | Freq: Two times a day (BID) | INTRAVENOUS | Status: DC

## 2023-10-17 NOTE — Transfer of Care (Signed)
Immediate Anesthesia Transfer of Care Note  Patient: [name] [name]  Procedure(s) Performed: CARDIOVERSION  Patient Location: PACU  Anesthesia Type:General  Level of Consciousness: awake, alert , and oriented  Airway & Oxygen Therapy: Patient Spontanous Breathing  Post-op Assessment: Report given to RN and Post -op Vital signs reviewed and stable  Post vital signs: Reviewed and stable  Last Vitals:  Vitals Value Taken Time  BP 138/86 10/17/23 0816  Temp    Pulse 79 10/17/23 0818  Resp 12 10/17/23 0818  SpO2 99 % 10/17/23 0818    Last Pain:  Vitals:   10/17/23 0743  TempSrc: Oral  PainSc: 0-No pain         Complications: No notable events documented.

## 2023-10-17 NOTE — CV Procedure (Signed)
Cardioversion procedure note For atrial flutter  Procedure Details:  Consent: Risks of procedure as well as the alternatives and risks of each were explained to the (patient/caregiver).  Consent for procedure obtained.  Time Out: Verified patient identification, verified procedure, site/side was marked, verified correct patient position, special equipment/implants available, medications/allergies/relevent history reviewed, required imaging and test results available.  Performed  Patient placed on cardiac monitor, pulse oximetry, supplemental oxygen as necessary.   Sedation given: propofol IV, Dr. Suzan Slick Pacer pads placed anterior and posterior chest.   Cardioverted 1 time(s).   Cardioverted at  150 J. Synchronized biphasic Converted to NSR   Evaluation: Findings: Post procedure EKG shows: NSR (AV paced rhythm) Complications: None Patient did tolerate procedure well.  Time Spent Directly with the Patient:  45 minutes   Dossie Arbour, M.D., Ph.D.

## 2023-10-17 NOTE — Anesthesia Postprocedure Evaluation (Signed)
Anesthesia Post Note  Patient: [name] [name]  Procedure(s) Performed: CARDIOVERSION  Patient location during evaluation: Specials Recovery Anesthesia Type: General Level of consciousness: awake and alert Pain management: pain level controlled Vital Signs Assessment: post-procedure vital signs reviewed and stable Respiratory status: spontaneous breathing, nonlabored ventilation, respiratory function stable and patient connected to nasal cannula oxygen Cardiovascular status: blood pressure returned to baseline and stable Postop Assessment: no apparent nausea or vomiting Anesthetic complications: no   No notable events documented.   Last Vitals:  Vitals:   10/17/23 0820 10/17/23 0830  BP: 134/83 127/74  Pulse: 80 61  Resp: 20 15  Temp:    SpO2: 100% 100%    Last Pain:  Vitals:   10/17/23 0830  TempSrc:   PainSc: 0-No pain                 Corinda Gubler

## 2023-10-17 NOTE — Anesthesia Preprocedure Evaluation (Signed)
Anesthesia Evaluation  Patient identified by MRN, date of birth, ID band Patient awake    Reviewed: Allergy & Precautions, NPO status , Patient's Chart, lab work & pertinent test results, reviewed documented beta blocker date and time   History of Anesthesia Complications Negative for: history of anesthetic complications  Airway Mallampati: IV  TM Distance: >3 FB Neck ROM: Full    Dental  (+) Upper Dentures, Partial Lower   Pulmonary sleep apnea and Continuous Positive Airway Pressure Ventilation , neg COPD, Patient abstained from smoking.Not current smoker, former smoker   Pulmonary exam normal breath sounds clear to auscultation       Cardiovascular Exercise Tolerance: Good METShypertension, (-) CAD and (-) Past MI Normal cardiovascular exam+ dysrhythmias (Last coumadin 07/20/2020) Atrial Fibrillation + pacemaker  Rhythm:Regular Rate:Normal - Systolic murmurs ECHO 12/2018 - Left ventricle: The cavity size was normal. Wall thickness was increased in a pattern of mild LVH. Systolic function was normal. The estimated ejection fraction was in the range of 55% to 65%.  - Mitral valve: There was mild regurgitation.  - Left atrium: The atrium was mildly dilated.   Neuro/Psych   Anxiety     negative neurological ROS  negative psych ROS   GI/Hepatic ,GERD  Controlled,,(+)     (-) substance abuse    Endo/Other  diabetes, Type 2  Class 4 obesity  Renal/GU CRFRenal disease     Musculoskeletal  (+) Arthritis ,    Abdominal  (+) + obese  Peds  Hematology  (+) Blood dyscrasia, anemia   Anesthesia Other Findings Past Medical History: No date: 1st degree AV block     Comment:  a. PR 400 msec with Apacing No date: A-fib (HCC)     Comment:  Hx of No date: Anemia No date: Anxiety No date: Arthritis     Comment:  knees and lower back  No date: Chest pain     Comment:  a. 2004 Cath:  reportedly nl;  b. 09/2013 Neg MV. No date:  Chronic diastolic heart failure (HCC)     Comment:  a. 09/2013 Echo: EF 50-55%, mild LVH, mild to mod MR/TR. No date: Chronic kidney disease     Comment:  Stage IV No date: Colon polyps No date: Congestive heart disease (HCC) No date: Esophageal reflux No date: Glaucoma No date: Gout No date: H/O hiatal hernia No date: History of UTI No date: Hyperlipidemia No date: Morbid obesity (HCC) No date: OSA on CPAP     Comment:  CPAP No date: Pacemaker     Comment:  copy of medtronic card in chart No date: PAF (paroxysmal atrial fibrillation) (HCC) No date: Seasonal allergies No date: SSS (sick sinus syndrome) (HCC)     Comment:  a. 2008 s/p MDT PPM;  b. 11/2013 Gen change- MDT ADDRL1               Adapta DC PPM, ser # [id number] H. No date: Symptomatic PVCs No date: Syncope and collapse     Comment:  a. Felt to be vasovagal. No date: Thyroid disease     Comment:  nodules on thyroid, with goiter, on no meds per pt No date: Type II diabetes mellitus (HCC)     Comment:  Type 2 No date: Unspecified essential hypertension     Comment:  controlled on meds No date: Unspecified glaucoma(365.9) No date: Vertigo     Comment:  in past No date: Wears dentures     Comment:  upper full and lower  partial  Reproductive/Obstetrics                             Anesthesia Physical Anesthesia Plan  ASA: 3  Anesthesia Plan: General   Post-op Pain Management: Minimal or no pain anticipated   Induction: Intravenous  PONV Risk Score and Plan: 3 and Ondansetron, TIVA, Midazolam and Treatment may vary due to age or medical condition  Airway Management Planned: Nasal Cannula and Natural Airway  Additional Equipment: None  Intra-op Plan:   Post-operative Plan:   Informed Consent: I have reviewed the patients History and Physical, chart, labs and discussed the procedure including the risks, benefits and alternatives for the proposed anesthesia with the patient or  authorized representative who has indicated his/her understanding and acceptance.     Dental advisory given  Plan Discussed with: CRNA  Anesthesia Plan Comments: (Discussed risks of anesthesia with patient, including possibility of difficulty with spontaneous ventilation under anesthesia necessitating airway intervention, PONV, and rare risks such as cardiac or respiratory or neurological events, and allergic reactions. Discussed the role of CRNA in patient's perioperative care. Patient understands.)        Anesthesia Quick Evaluation

## 2023-10-19 ENCOUNTER — Telehealth: Payer: Self-pay | Admitting: Cardiovascular Disease

## 2023-10-19 ENCOUNTER — Emergency Department: Payer: PPO

## 2023-10-19 ENCOUNTER — Inpatient Hospital Stay
Admission: EM | Admit: 2023-10-19 | Discharge: 2023-10-23 | DRG: 286 | Disposition: A | Discharge status: Home Health | Payer: PPO | Attending: Obstetrics and Gynecology | Admitting: Obstetrics and Gynecology

## 2023-10-19 ENCOUNTER — Other Ambulatory Visit: Payer: Self-pay

## 2023-10-19 ENCOUNTER — Encounter: Payer: Self-pay | Admitting: Family Medicine

## 2023-10-19 DIAGNOSIS — I495 Sick sinus syndrome: Secondary | ICD-10-CM | POA: Diagnosis not present

## 2023-10-19 DIAGNOSIS — I517 Cardiomegaly: Secondary | ICD-10-CM | POA: Diagnosis not present

## 2023-10-19 DIAGNOSIS — I509 Heart failure, unspecified: Secondary | ICD-10-CM | POA: Diagnosis not present

## 2023-10-19 DIAGNOSIS — R0602 Shortness of breath: Secondary | ICD-10-CM | POA: Diagnosis not present

## 2023-10-19 DIAGNOSIS — E785 Hyperlipidemia, unspecified: Secondary | ICD-10-CM

## 2023-10-19 DIAGNOSIS — I4892 Unspecified atrial flutter: Secondary | ICD-10-CM | POA: Diagnosis not present

## 2023-10-19 DIAGNOSIS — N2581 Secondary hyperparathyroidism of renal origin: Secondary | ICD-10-CM | POA: Diagnosis not present

## 2023-10-19 DIAGNOSIS — D509 Iron deficiency anemia, unspecified: Secondary | ICD-10-CM | POA: Diagnosis not present

## 2023-10-19 DIAGNOSIS — I5041 Acute combined systolic (congestive) and diastolic (congestive) heart failure: Secondary | ICD-10-CM | POA: Diagnosis not present

## 2023-10-19 DIAGNOSIS — I5033 Acute on chronic diastolic (congestive) heart failure: Secondary | ICD-10-CM | POA: Diagnosis not present

## 2023-10-19 DIAGNOSIS — J309 Allergic rhinitis, unspecified: Secondary | ICD-10-CM | POA: Diagnosis present

## 2023-10-19 DIAGNOSIS — Z7901 Long term (current) use of anticoagulants: Secondary | ICD-10-CM

## 2023-10-19 DIAGNOSIS — I5031 Acute diastolic (congestive) heart failure: Secondary | ICD-10-CM | POA: Diagnosis not present

## 2023-10-19 DIAGNOSIS — Z789 Other specified health status: Secondary | ICD-10-CM

## 2023-10-19 DIAGNOSIS — I5032 Chronic diastolic (congestive) heart failure: Secondary | ICD-10-CM | POA: Diagnosis not present

## 2023-10-19 DIAGNOSIS — E1165 Type 2 diabetes mellitus with hyperglycemia: Secondary | ICD-10-CM | POA: Diagnosis not present

## 2023-10-19 DIAGNOSIS — Z950 Presence of cardiac pacemaker: Secondary | ICD-10-CM

## 2023-10-19 DIAGNOSIS — K219 Gastro-esophageal reflux disease without esophagitis: Secondary | ICD-10-CM | POA: Diagnosis present

## 2023-10-19 DIAGNOSIS — G4733 Obstructive sleep apnea (adult) (pediatric): Secondary | ICD-10-CM | POA: Diagnosis present

## 2023-10-19 DIAGNOSIS — Z961 Presence of intraocular lens: Secondary | ICD-10-CM | POA: Diagnosis present

## 2023-10-19 DIAGNOSIS — Z8616 Personal history of COVID-19: Secondary | ICD-10-CM

## 2023-10-19 DIAGNOSIS — Z6841 Body Mass Index (BMI) 40.0 and over, adult: Secondary | ICD-10-CM | POA: Diagnosis not present

## 2023-10-19 DIAGNOSIS — E1122 Type 2 diabetes mellitus with diabetic chronic kidney disease: Secondary | ICD-10-CM | POA: Diagnosis present

## 2023-10-19 DIAGNOSIS — D631 Anemia in chronic kidney disease: Secondary | ICD-10-CM | POA: Diagnosis not present

## 2023-10-19 DIAGNOSIS — I130 Hypertensive heart and chronic kidney disease with heart failure and stage 1 through stage 4 chronic kidney disease, or unspecified chronic kidney disease: Secondary | ICD-10-CM | POA: Diagnosis present

## 2023-10-19 DIAGNOSIS — Z79899 Other long term (current) drug therapy: Secondary | ICD-10-CM

## 2023-10-19 DIAGNOSIS — I10 Essential (primary) hypertension: Secondary | ICD-10-CM

## 2023-10-19 DIAGNOSIS — Z1152 Encounter for screening for COVID-19: Secondary | ICD-10-CM

## 2023-10-19 DIAGNOSIS — F419 Anxiety disorder, unspecified: Secondary | ICD-10-CM | POA: Diagnosis present

## 2023-10-19 DIAGNOSIS — I272 Pulmonary hypertension, unspecified: Secondary | ICD-10-CM

## 2023-10-19 DIAGNOSIS — N184 Chronic kidney disease, stage 4 (severe): Secondary | ICD-10-CM | POA: Diagnosis present

## 2023-10-19 DIAGNOSIS — Z87891 Personal history of nicotine dependence: Secondary | ICD-10-CM

## 2023-10-19 DIAGNOSIS — M170 Bilateral primary osteoarthritis of knee: Secondary | ICD-10-CM | POA: Diagnosis present

## 2023-10-19 DIAGNOSIS — M109 Gout, unspecified: Secondary | ICD-10-CM | POA: Diagnosis present

## 2023-10-19 DIAGNOSIS — R5382 Chronic fatigue, unspecified: Secondary | ICD-10-CM | POA: Diagnosis present

## 2023-10-19 DIAGNOSIS — I4891 Unspecified atrial fibrillation: Secondary | ICD-10-CM | POA: Diagnosis not present

## 2023-10-19 DIAGNOSIS — E66813 Obesity, class 3: Secondary | ICD-10-CM | POA: Diagnosis present

## 2023-10-19 DIAGNOSIS — J9601 Acute respiratory failure with hypoxia: Secondary | ICD-10-CM | POA: Diagnosis not present

## 2023-10-19 DIAGNOSIS — R918 Other nonspecific abnormal finding of lung field: Secondary | ICD-10-CM | POA: Diagnosis not present

## 2023-10-19 DIAGNOSIS — E049 Nontoxic goiter, unspecified: Secondary | ICD-10-CM | POA: Diagnosis not present

## 2023-10-19 DIAGNOSIS — I480 Paroxysmal atrial fibrillation: Principal | ICD-10-CM | POA: Diagnosis present

## 2023-10-19 DIAGNOSIS — N281 Cyst of kidney, acquired: Secondary | ICD-10-CM | POA: Diagnosis not present

## 2023-10-19 DIAGNOSIS — I110 Hypertensive heart disease with heart failure: Secondary | ICD-10-CM | POA: Diagnosis not present

## 2023-10-19 DIAGNOSIS — N179 Acute kidney failure, unspecified: Secondary | ICD-10-CM | POA: Diagnosis not present

## 2023-10-19 DIAGNOSIS — Z7984 Long term (current) use of oral hypoglycemic drugs: Secondary | ICD-10-CM

## 2023-10-19 DIAGNOSIS — Z8249 Family history of ischemic heart disease and other diseases of the circulatory system: Secondary | ICD-10-CM

## 2023-10-19 DIAGNOSIS — Z888 Allergy status to other drugs, medicaments and biological substances status: Secondary | ICD-10-CM

## 2023-10-19 DIAGNOSIS — E7800 Pure hypercholesterolemia, unspecified: Secondary | ICD-10-CM | POA: Diagnosis present

## 2023-10-19 DIAGNOSIS — H409 Unspecified glaucoma: Secondary | ICD-10-CM | POA: Diagnosis present

## 2023-10-19 DIAGNOSIS — R9431 Abnormal electrocardiogram [ECG] [EKG]: Secondary | ICD-10-CM

## 2023-10-19 DIAGNOSIS — J189 Pneumonia, unspecified organism: Secondary | ICD-10-CM | POA: Diagnosis not present

## 2023-10-19 DIAGNOSIS — I440 Atrioventricular block, first degree: Secondary | ICD-10-CM | POA: Diagnosis present

## 2023-10-19 DIAGNOSIS — Z881 Allergy status to other antibiotic agents status: Secondary | ICD-10-CM

## 2023-10-19 DIAGNOSIS — T501X5A Adverse effect of loop [high-ceiling] diuretics, initial encounter: Secondary | ICD-10-CM | POA: Diagnosis not present

## 2023-10-19 DIAGNOSIS — Z90710 Acquired absence of both cervix and uterus: Secondary | ICD-10-CM

## 2023-10-19 HISTORY — DX: Pulmonary hypertension, unspecified: I27.20

## 2023-10-19 HISTORY — DX: Chronic kidney disease, stage 4 (severe): N18.4

## 2023-10-19 HISTORY — DX: Essential (primary) hypertension: I10

## 2023-10-19 HISTORY — DX: Nonrheumatic mitral (valve) insufficiency: I34.0

## 2023-10-19 HISTORY — DX: Obstructive sleep apnea (adult) (pediatric): G47.33

## 2023-10-19 HISTORY — DX: Rheumatic tricuspid insufficiency: I07.1

## 2023-10-19 LAB — CBC
HCT: 34.6 % — ABNORMAL LOW (ref 36.0–46.0)
Hemoglobin: 11.3 g/dL — ABNORMAL LOW (ref 12.0–15.0)
MCH: 26.5 pg (ref 26.0–34.0)
MCHC: 32.7 g/dL (ref 30.0–36.0)
MCV: 81.2 fL (ref 80.0–100.0)
Platelets: 157 10*3/uL (ref 150–400)
RBC: 4.26 MIL/uL (ref 3.87–5.11)
RDW: 15.6 % — ABNORMAL HIGH (ref 11.5–15.5)
WBC: 9.9 10*3/uL (ref 4.0–10.5)
nRBC: 0 % (ref 0.0–0.2)

## 2023-10-19 LAB — CBC WITH DIFFERENTIAL/PLATELET
Abs Immature Granulocytes: 0.03 10*3/uL (ref 0.00–0.07)
Basophils Absolute: 0 10*3/uL (ref 0.0–0.1)
Basophils Relative: 0 %
Eosinophils Absolute: 0.1 10*3/uL (ref 0.0–0.5)
Eosinophils Relative: 2 %
HCT: 33.7 % — ABNORMAL LOW (ref 36.0–46.0)
Hemoglobin: 11.4 g/dL — ABNORMAL LOW (ref 12.0–15.0)
Immature Granulocytes: 0 %
Lymphocytes Relative: 18 %
Lymphs Abs: 1.3 10*3/uL (ref 0.7–4.0)
MCH: 27 pg (ref 26.0–34.0)
MCHC: 33.8 g/dL (ref 30.0–36.0)
MCV: 79.7 fL — ABNORMAL LOW (ref 80.0–100.0)
Monocytes Absolute: 0.5 10*3/uL (ref 0.1–1.0)
Monocytes Relative: 7 %
Neutro Abs: 5.4 10*3/uL (ref 1.7–7.7)
Neutrophils Relative %: 73 %
Platelets: 140 10*3/uL — ABNORMAL LOW (ref 150–400)
RBC: 4.23 MIL/uL (ref 3.87–5.11)
RDW: 15.5 % (ref 11.5–15.5)
WBC: 7.4 10*3/uL (ref 4.0–10.5)
nRBC: 0 % (ref 0.0–0.2)

## 2023-10-19 LAB — COMPREHENSIVE METABOLIC PANEL
ALT: 45 U/L — ABNORMAL HIGH (ref 0–44)
AST: 56 U/L — ABNORMAL HIGH (ref 15–41)
Albumin: 3.8 g/dL (ref 3.5–5.0)
Alkaline Phosphatase: 84 U/L (ref 38–126)
Anion gap: 10 (ref 5–15)
BUN: 33 mg/dL — ABNORMAL HIGH (ref 8–23)
CO2: 25 mmol/L (ref 22–32)
Calcium: 9.1 mg/dL (ref 8.9–10.3)
Chloride: 102 mmol/L (ref 98–111)
Creatinine, Ser: 2.42 mg/dL — ABNORMAL HIGH (ref 0.44–1.00)
GFR, Estimated: 19 mL/min — ABNORMAL LOW (ref >60)
Glucose, Bld: 219 mg/dL — ABNORMAL HIGH (ref 70–99)
Potassium: 3.6 mmol/L (ref 3.5–5.1)
Sodium: 137 mmol/L (ref 135–145)
Total Bilirubin: 0.8 mg/dL (ref <1.2)
Total Protein: 6.8 g/dL (ref 6.5–8.1)

## 2023-10-19 LAB — BASIC METABOLIC PANEL
Anion gap: 11 (ref 5–15)
BUN: 34 mg/dL — ABNORMAL HIGH (ref 8–23)
CO2: 26 mmol/L (ref 22–32)
Calcium: 9.1 mg/dL (ref 8.9–10.3)
Chloride: 101 mmol/L (ref 98–111)
Creatinine, Ser: 2.59 mg/dL — ABNORMAL HIGH (ref 0.44–1.00)
GFR, Estimated: 18 mL/min — ABNORMAL LOW (ref >60)
Glucose, Bld: 377 mg/dL — ABNORMAL HIGH (ref 70–99)
Potassium: 4.1 mmol/L (ref 3.5–5.1)
Sodium: 138 mmol/L (ref 135–145)

## 2023-10-19 LAB — BRAIN NATRIURETIC PEPTIDE: B Natriuretic Peptide: 577.9 pg/mL — ABNORMAL HIGH (ref 0.0–100.0)

## 2023-10-19 LAB — TROPONIN I (HIGH SENSITIVITY)
Troponin I (High Sensitivity): 17 ng/L (ref <18)
Troponin I (High Sensitivity): 17 ng/L (ref <18)

## 2023-10-19 LAB — HIV ANTIBODY (ROUTINE TESTING W REFLEX): HIV Screen 4th Generation wRfx: NONREACTIVE

## 2023-10-19 LAB — LACTIC ACID, PLASMA: Lactic Acid, Venous: 0.7 mmol/L (ref 0.5–1.9)

## 2023-10-19 LAB — RESP PANEL BY RT-PCR (RSV, FLU A&B, COVID)  RVPGX2
Influenza A by PCR: NEGATIVE
Influenza B by PCR: NEGATIVE
Resp Syncytial Virus by PCR: NEGATIVE
SARS Coronavirus 2 by RT PCR: NEGATIVE

## 2023-10-19 LAB — PROTIME-INR
INR: 2.3 — ABNORMAL HIGH (ref 0.8–1.2)
Prothrombin Time: 25.7 s — ABNORMAL HIGH (ref 11.4–15.2)

## 2023-10-19 LAB — GLUCOSE, CAPILLARY: Glucose-Capillary: 100 mg/dL — ABNORMAL HIGH (ref 70–99)

## 2023-10-19 LAB — CBG MONITORING, ED
Glucose-Capillary: 371 mg/dL — ABNORMAL HIGH (ref 70–99)
Glucose-Capillary: 168 mg/dL — ABNORMAL HIGH (ref 70–99)

## 2023-10-19 MED ORDER — CARVEDILOL 25 MG PO TABS: 25.0000 mg | ORAL_TABLET | ORAL | Status: DC

## 2023-10-19 MED ORDER — ACETAMINOPHEN 650 MG RE SUPP: 650.0000 mg | Freq: Four times a day (QID) | RECTAL | Status: DC | PRN

## 2023-10-19 MED ORDER — LINAGLIPTIN 5 MG PO TABS
5.0000 mg | ORAL_TABLET | Freq: Every day | ORAL | Status: DC
  Administered 2023-10-19 – 2023-10-23 (×5): 5 mg via ORAL
  Filled 2023-10-19 (×5): qty 1

## 2023-10-19 MED ORDER — TRAZODONE HCL 50 MG PO TABS
25.0000 mg | ORAL_TABLET | Freq: Every evening | ORAL | Status: DC | PRN
  Administered 2023-10-19 – 2023-10-22 (×5): 25 mg via ORAL
  Filled 2023-10-19 (×5): qty 1

## 2023-10-19 MED ORDER — FUROSEMIDE 10 MG/ML IJ SOLN
40.0000 mg | Freq: Two times a day (BID) | INTRAMUSCULAR | Status: DC
  Administered 2023-10-19: 40 mg via INTRAVENOUS
  Filled 2023-10-19: qty 4

## 2023-10-19 MED ORDER — INSULIN ASPART 100 UNIT/ML IJ SOLN
0.0000 [IU] | Freq: Three times a day (TID) | INTRAMUSCULAR | Status: DC
  Administered 2023-10-19: 9 [IU] via SUBCUTANEOUS
  Administered 2023-10-19: 2 [IU] via SUBCUTANEOUS
  Administered 2023-10-20: 3 [IU] via SUBCUTANEOUS
  Administered 2023-10-20: 2 [IU] via SUBCUTANEOUS
  Administered 2023-10-20: 1 [IU] via SUBCUTANEOUS
  Administered 2023-10-21: 2 [IU] via SUBCUTANEOUS
  Administered 2023-10-21: 3 [IU] via SUBCUTANEOUS
  Administered 2023-10-21 – 2023-10-22 (×2): 2 [IU] via SUBCUTANEOUS
  Administered 2023-10-22: 3 [IU] via SUBCUTANEOUS
  Administered 2023-10-22 – 2023-10-23 (×2): 2 [IU] via SUBCUTANEOUS
  Administered 2023-10-23: 1 [IU] via SUBCUTANEOUS
  Filled 2023-10-19 (×13): qty 1

## 2023-10-19 MED ORDER — POLYETHYLENE GLYCOL 3350 17 G PO PACK: 17.0000 g | PACK | Freq: Every day | ORAL | Status: DC | PRN

## 2023-10-19 MED ORDER — HYDROCOD POLI-CHLORPHE POLI ER 10-8 MG/5ML PO SUER
5.0000 mL | Freq: Two times a day (BID) | ORAL | Status: DC | PRN
  Administered 2023-10-19: 5 mL via ORAL
  Filled 2023-10-19: qty 5

## 2023-10-19 MED ORDER — CARVEDILOL 25 MG PO TABS
50.0000 mg | ORAL_TABLET | Freq: Every day | ORAL | Status: DC
  Administered 2023-10-19 – 2023-10-22 (×4): 50 mg via ORAL
  Filled 2023-10-19 (×4): qty 2

## 2023-10-19 MED ORDER — ALLOPURINOL 100 MG PO TABS
100.0000 mg | ORAL_TABLET | Freq: Every day | ORAL | Status: DC
  Administered 2023-10-19: 100 mg via ORAL
  Filled 2023-10-19: qty 1

## 2023-10-19 MED ORDER — INSULIN ASPART 100 UNIT/ML IJ SOLN
0.0000 [IU] | Freq: Every day | INTRAMUSCULAR | Status: DC
  Administered 2023-10-21 – 2023-10-22 (×2): 2 [IU] via SUBCUTANEOUS
  Filled 2023-10-19 (×2): qty 1

## 2023-10-19 MED ORDER — ATORVASTATIN CALCIUM 20 MG PO TABS
20.0000 mg | ORAL_TABLET | Freq: Every day | ORAL | Status: DC
  Administered 2023-10-19 – 2023-10-23 (×5): 20 mg via ORAL
  Filled 2023-10-19 (×5): qty 1

## 2023-10-19 MED ORDER — ASPIRIN 81 MG PO CHEW
324.0000 mg | CHEWABLE_TABLET | Freq: Once | ORAL | Status: AC
  Administered 2023-10-19: 324 mg via ORAL
  Filled 2023-10-19: qty 4

## 2023-10-19 MED ORDER — IPRATROPIUM-ALBUTEROL 0.5-2.5 (3) MG/3ML IN SOLN
3.0000 mL | Freq: Four times a day (QID) | RESPIRATORY_TRACT | Status: DC
  Administered 2023-10-19 – 2023-10-20 (×5): 3 mL via RESPIRATORY_TRACT
  Filled 2023-10-19 (×5): qty 3

## 2023-10-19 MED ORDER — DEXTROSE 5 % IV SOLN: 500.0000 mg | INTRAVENOUS | Status: DC

## 2023-10-19 MED ORDER — MECLIZINE HCL 25 MG PO TABS: 12.5000 mg | ORAL_TABLET | Freq: Three times a day (TID) | ORAL | Status: DC | PRN

## 2023-10-19 MED ORDER — SODIUM CHLORIDE 0.9 % IV SOLN: 2.0000 g | INTRAVENOUS | Status: DC

## 2023-10-19 MED ORDER — MORPHINE SULFATE (PF) 2 MG/ML IV SOLN
2.0000 mg | Freq: Once | INTRAVENOUS | Status: AC
Start: 2023-10-19 — End: 2023-10-19
  Administered 2023-10-19: 2 mg via INTRAVENOUS
  Filled 2023-10-19: qty 1

## 2023-10-19 MED ORDER — SODIUM CHLORIDE 0.9 % IV SOLN
1.0000 g | Freq: Once | INTRAVENOUS | Status: AC
  Administered 2023-10-19: 1 g via INTRAVENOUS
  Filled 2023-10-19: qty 10

## 2023-10-19 MED ORDER — AMLODIPINE BESYLATE 5 MG PO TABS
5.0000 mg | ORAL_TABLET | Freq: Every day | ORAL | Status: DC
  Administered 2023-10-19 – 2023-10-23 (×5): 5 mg via ORAL
  Filled 2023-10-19 (×5): qty 1

## 2023-10-19 MED ORDER — FERROUS SULFATE 325 (65 FE) MG PO TABS
325.0000 mg | ORAL_TABLET | Freq: Every day | ORAL | Status: DC
  Administered 2023-10-19 – 2023-10-23 (×5): 325 mg via ORAL
  Filled 2023-10-19 (×6): qty 1

## 2023-10-19 MED ORDER — FUROSEMIDE 40 MG PO TABS: 40.0000 mg | ORAL_TABLET | Freq: Every day | ORAL | Status: DC

## 2023-10-19 MED ORDER — GUAIFENESIN ER 600 MG PO TB12
600.0000 mg | ORAL_TABLET | Freq: Two times a day (BID) | ORAL | Status: DC
  Administered 2023-10-19 – 2023-10-23 (×8): 600 mg via ORAL
  Filled 2023-10-19 (×8): qty 1

## 2023-10-19 MED ORDER — CEFTRIAXONE SODIUM 1 G IJ SOLR
1.0000 g | Freq: Once | INTRAMUSCULAR | Status: AC
  Administered 2023-10-19: 1 g via INTRAVENOUS
  Filled 2023-10-19: qty 10

## 2023-10-19 MED ORDER — DEXTROSE 5 % IV SOLN
500.0000 mg | Freq: Once | INTRAVENOUS | Status: AC
  Administered 2023-10-19: 500 mg via INTRAVENOUS
  Filled 2023-10-19: qty 5

## 2023-10-19 MED ORDER — ACETAMINOPHEN 325 MG PO TABS
650.0000 mg | ORAL_TABLET | Freq: Four times a day (QID) | ORAL | Status: DC | PRN
  Filled 2023-10-19: qty 2

## 2023-10-19 MED ORDER — ENOXAPARIN SODIUM 40 MG/0.4ML IJ SOSY
40.0000 mg | PREFILLED_SYRINGE | INTRAMUSCULAR | Status: DC
  Administered 2023-10-19: 40 mg via SUBCUTANEOUS
  Filled 2023-10-19: qty 0.4

## 2023-10-19 MED ORDER — POTASSIUM CHLORIDE CRYS ER 10 MEQ PO TBCR
10.0000 meq | EXTENDED_RELEASE_TABLET | Freq: Every day | ORAL | Status: DC
  Administered 2023-10-19 – 2023-10-23 (×5): 10 meq via ORAL
  Filled 2023-10-19 (×5): qty 1

## 2023-10-19 MED ORDER — PANTOPRAZOLE SODIUM 40 MG PO TBEC
40.0000 mg | DELAYED_RELEASE_TABLET | Freq: Every day | ORAL | Status: DC
  Administered 2023-10-19 – 2023-10-23 (×5): 40 mg via ORAL
  Filled 2023-10-19 (×5): qty 1

## 2023-10-19 MED ORDER — ONDANSETRON HCL 4 MG/2ML IJ SOLN
4.0000 mg | Freq: Once | INTRAMUSCULAR | Status: AC
Start: 2023-10-19 — End: 2023-10-19
  Administered 2023-10-19: 4 mg via INTRAVENOUS
  Filled 2023-10-19: qty 2

## 2023-10-19 MED ORDER — ADULT MULTIVITAMIN W/MINERALS CH
1.0000 | ORAL_TABLET | Freq: Every day | ORAL | Status: DC
  Administered 2023-10-19 – 2023-10-23 (×5): 1 via ORAL
  Filled 2023-10-19 (×5): qty 1

## 2023-10-19 MED ORDER — GLYCERIN (LAXATIVE) 2 G RE SUPP: 1.0000 | Freq: Every day | RECTAL | Status: DC | PRN

## 2023-10-19 MED ORDER — IPRATROPIUM-ALBUTEROL 0.5-2.5 (3) MG/3ML IN SOLN: 3.0000 mL | RESPIRATORY_TRACT | Status: DC | PRN

## 2023-10-19 MED ORDER — CARVEDILOL 25 MG PO TABS
25.0000 mg | ORAL_TABLET | Freq: Every day | ORAL | Status: DC
  Administered 2023-10-19 – 2023-10-23 (×5): 25 mg via ORAL
  Filled 2023-10-19 (×6): qty 1

## 2023-10-19 MED ORDER — WARFARIN - PHYSICIAN DOSING INPATIENT
Freq: Every day | Status: DC
  Filled 2023-10-19: qty 1

## 2023-10-19 MED ORDER — MAGNESIUM HYDROXIDE 400 MG/5ML PO SUSP
30.0000 mL | Freq: Every day | ORAL | Status: DC | PRN
  Administered 2023-10-22: 30 mL via ORAL
  Filled 2023-10-19: qty 30

## 2023-10-19 MED ORDER — CALCITRIOL 0.25 MCG PO CAPS
0.2500 ug | ORAL_CAPSULE | Freq: Every day | ORAL | Status: DC
  Administered 2023-10-19 – 2023-10-23 (×5): 0.25 ug via ORAL
  Filled 2023-10-19 (×5): qty 1

## 2023-10-19 MED ORDER — AMIODARONE HCL 200 MG PO TABS
200.0000 mg | ORAL_TABLET | Freq: Every day | ORAL | Status: DC
  Administered 2023-10-19 – 2023-10-23 (×5): 200 mg via ORAL
  Filled 2023-10-19 (×5): qty 1

## 2023-10-19 MED ORDER — FUROSEMIDE 10 MG/ML IJ SOLN
40.0000 mg | Freq: Once | INTRAMUSCULAR | Status: AC
  Administered 2023-10-19: 40 mg via INTRAVENOUS
  Filled 2023-10-19: qty 4

## 2023-10-19 MED ORDER — WARFARIN SODIUM 2 MG PO TABS
2.0000 mg | ORAL_TABLET | Freq: Every day | ORAL | Status: DC
  Administered 2023-10-19: 2 mg via ORAL
  Filled 2023-10-19 (×2): qty 1

## 2023-10-19 MED ORDER — ONDANSETRON HCL 4 MG PO TABS: 4.0000 mg | ORAL_TABLET | Freq: Four times a day (QID) | ORAL | Status: DC | PRN

## 2023-10-19 MED ORDER — ONDANSETRON HCL 4 MG/2ML IJ SOLN
4.0000 mg | Freq: Four times a day (QID) | INTRAMUSCULAR | Status: DC | PRN
  Administered 2023-10-19: 4 mg via INTRAVENOUS
  Filled 2023-10-19 (×2): qty 2

## 2023-10-19 NOTE — ED Notes (Signed)
Patient stuck for BC's x2 and lactic. New INT placed to give IV Abx. VSS, CCM in use, call light within reach, no other needs identified at this time.

## 2023-10-19 NOTE — ED Notes (Signed)
This RN assisted pt on to bedpan. Pt voided . Peri care conducted and chuck pad changed. Pt repositioned. Call light within reach

## 2023-10-19 NOTE — ED Notes (Signed)
Family at bedside, blanket given. NAD. Call light within reach

## 2023-10-19 NOTE — TOC Initial Note (Signed)
Transition of Care [facility]) - Initial/Assessment Note    Patient Details  Name: [name] [name] MRN: [id number] Date of Birth: 1935-10-26  [facility] of [facility]) CM/SW Contact:    Marquita Palms, LCSW Phone Number: 10/19/2023, 1:45 PM  Clinical Narrative:                  CSW met with patient and sons at bedside. Patient asleep and resting well. Patient son Trey Paula and Jonna Coup reported information for patient. Patient will be admitted to the hospital. Patients sons reported patient "probably would not go into SNF, but will be more agreeable to home health aide at home. Awaiting admission to the hospital.       Patient Goals and CMS Choice            Expected Discharge Plan and Services                                              Prior Living Arrangements/Services                       Activities of Daily Living   ADL Screening (condition at time of admission) Independently performs ADLs?: No Does the patient have a NEW difficulty with bathing/dressing/toileting/self-feeding that is expected to last >3 days?: No Does the patient have a NEW difficulty with getting in/out of bed, walking, or climbing stairs that is expected to last >3 days?: No Does the patient have a NEW difficulty with communication that is expected to last >3 days?: No Is the patient deaf or have difficulty hearing?: No Does the patient have difficulty seeing, even when wearing glasses/contacts?: No Does the patient have difficulty concentrating, remembering, or making decisions?: No  Permission Sought/Granted                  Emotional Assessment              Admission diagnosis:  Multifocal pneumonia [J18.9] Patient Active Problem List   Diagnosis Date Noted   Multifocal pneumonia 10/19/2023   Acute CHF (congestive heart failure) (HCC) 10/19/2023   Dyslipidemia 10/19/2023   Atrial fibrillation and flutter (HCC) 10/19/2023   Anemia due to chronic renal failure  treated with erythropoietin, stage 4 (severe) (HCC) 08/30/2021   Edema of lower extremity 10/02/2019   Secondary hyperparathyroidism of renal origin (HCC) 10/02/2019   Anemia in chronic kidney disease 09/30/2019   Callus of foot 03/28/2019   SOB (shortness of breath) 12/01/2018   Multinodular goiter 08/14/2018   Encounter for general adult medical examination without abnormal findings 02/27/2018   Vaccine counseling 02/27/2018   Morbid obesity due to excess calories (HCC) 10/29/2017   Adhesive capsulitis of right shoulder 05/04/2017   Eczema 04/03/2017   Glaucoma (increased eye pressure) 04/03/2017   Hiatal hernia 04/03/2017   Iron deficiency anemia 04/03/2017   Osteoarthritis 04/03/2017   Pure hypercholesterolemia 04/03/2017   Tinnitus 04/03/2017   Valvular heart disease 04/03/2017   Venous stasis 04/03/2017   Bilateral hearing loss 03/07/2017   Tinnitus, bilateral 03/07/2017   Pounding noise in right ear 02/13/2017   Eczematous skin lesions 02/02/2017   URI with cough and congestion 12/13/2016   Colicky RLQ abdominal pain 11/01/2016   Hemoglobin C disease (HCC) 08/02/2016   Anxiety 07/28/2016   Pain of right hip joint 03/23/2016   CKD (chronic kidney  disease) stage 4, GFR 15-29 ml/min (HCC) 02/03/2016   Chronic gout of right foot 12/16/2015   Personal history of gout 12/16/2015   Productive cough 10/26/2015   Infection of skin of left ear lobe 10/11/2015   Allergic rhinitis 09/14/2015   Hyperlipidemia 09/14/2015   Gout attack 07/21/2015   Anticoagulation monitoring, INR range 2-3 05/18/2015   RLS (restless legs syndrome) 04/14/2015   History of colon polyps 12/23/2014   Crohn's disease in remission (HCC) 09/15/2014   1st degree AV block    Chronic diastolic heart failure (HCC)    Cardiac arrhythmia 12/01/2013   Hemoptysis 11/24/2013   Acute on chronic diastolic CHF (congestive heart failure) (HCC) 10/17/2013   Episodic lightheadedness 07/16/2013   Palpitations  07/16/2013   Syncope 08/15/2012   Back pain 05/30/2012   OSA (obstructive sleep apnea) 01/08/2012   DYSPNEA 01/28/2010   PVC (premature ventricular contraction) 09/13/2009   Flutter-fibrillation (HCC) 09/13/2009   EDEMA 08/23/2009   Diabetes mellitus type 2, controlled, without complications (HCC) 04/13/2009   Unspecified glaucoma 04/13/2009   Essential hypertension 04/13/2009   PAF (paroxysmal atrial fibrillation) (HCC) 04/13/2009   SICK SINUS/ TACHY-BRADY SYNDROME 04/13/2009   GERD 04/13/2009   Cardiac pacemaker - Medtronic 04/13/2009   PCP:  Dale [place], MD Pharmacy:   [facility] [phone] [address] [address], [place] - [address] [place] [address] Phone: [phone] Fax: [phone]     Social Determinants of Health (SDOH) Social History: SDOH Screenings   Food Insecurity: Patient Declined (10/19/2023)  Housing: Patient Declined (10/19/2023)  Transportation Needs: Patient Declined (10/19/2023)  Utilities: Patient Declined (10/19/2023)  Depression (PHQ2-9): Medium Risk (10/11/2023)  Financial Resource Strain: Unknown (12/01/2018)  Physical Activity: Unknown (12/01/2018)  Social Connections: Unknown (12/01/2018)  Stress: Unknown (12/01/2018)  Tobacco Use: Medium Risk (10/19/2023)   SDOH Interventions:     Readmission Risk Interventions    10/19/2023    1:41 PM  Readmission Risk Prevention Plan  Transportation Screening Complete  PCP or Specialist Appt within 5-7 Days Complete  Home Care Screening Complete  Medication Review (RN CM) Not Complete  Med Review comments patient asleep; CSW spoke with her sons Trey Paula and [place]

## 2023-10-19 NOTE — ED Triage Notes (Signed)
Patient brought in tonight by [facility] from home with complaints of worsening shortness of breath. Patient was seen here yesterday and cardioverted, then discharged. Patient was attempting to go to sleep tonight and wearing her CPAP when she could not tolerate lying flat to breathe.

## 2023-10-19 NOTE — Assessment & Plan Note (Signed)
-   We will continue statin therapy. 

## 2023-10-19 NOTE — Telephone Encounter (Signed)
Called and spoke with niece per DPR. The niece says that the patient is in the Emergency Department with pneumonia and shortness of breath. The niece says that the patient is supposed to be admitted to the hospital but they will not take her to a room. The niece is upset and wants to know why the patient has not been admitted. Informed the niece that the chart does day that she is being admitted but they we have no control over getting the patient into a hospital room. Advised the niece to speak with the charge nurse in the Emergency Department to find out why the patient has not been moved to a room. Niece verbalizes understanding.

## 2023-10-19 NOTE — Progress Notes (Signed)
  Brief Progress Note (See full H&P from earlier today)     HPI: [name] [name] ia a 88 y.o. female w/ PMH atrial fibrillation on Coumadin, stage IV chronic kidney disease, GERD, CHF, anxiety, SSS status post pacemaker placement, and type 2 diabetes mellitus who presented 10/19/23 to ED via EMS from home with worsening shortness of breath <1 day and chest pain. Underwent cardioversion 11/13 w/ Dr Mariah Milling for Aflutter associated w/ dyspnea, converted NSR, discharged. Patient was attempting to go to sleep 11/14 and wearing her CPAP when she could not tolerate lying flat to breathe.   Hospital course / significant events:  11/15: to ED, admitted to hospitalist service for SOB w/ nre O2 requirement likely d/t CHF/CAP. Started diuresis and abx. D/c abx, did not feel strong clinical suspicion for CAP. Echo ordered.   Consultants:  none   Procedures/Surgeries: none      ASSESSMENT & PLAN:   Acute CHF (congestive heart failure)  Acute respiratory failure d/t pulmonary edema suspect diastolic etiology  Family state she has been drinking more water recently at the advice of her doctors to help her kidney function  Diuresis - caution w/ CKD continue Januvia and Coreg Strict I&O, daily weight, fluid restriction  Echocardiogram  Supplemental O2 as needed  Multifocal pneumonia ruled out CXR concerning for pneumonia but given no sepsis, no fever, no significant infectious respiratory symptoms such as cough, and w/ reasonable etiology for SOB being dx CHF as above  Will dc abx  Follow blood cultures     CKD4 Concern for cardiorenal syndrome Caution w/ diuresis  OSA CPAP at bedtime   Essential hypertension - We will continue antihypertensive therapy.   Dyslipidemia Continue home statin therapy.   Atrial fibrillation and flutter (HCC) continue home amiodarone, carvedilol, and Coumadin           Subjective: Pt tired this morning, not getting much sleep, SOB a bit better  though   Objective: Relevant new results:  none Physical Exam:  BP 106/62   Pulse (!) 59   Temp 98.3 F (36.8 C) (Oral)   Resp 15   Ht 5\' 4"  (1.626 m)   Wt 108.6 kg   SpO2 99%   BMI 41.10 kg/m  Constitutional:  General Appearance: alert, well-developed, well-nourished, NAD Respiratory: Normal respiratory effort Breath sounds (+)rales Cardiovascular: S1/S2 normal, RRR Trace lower extremity edema Gastrointestinal: Nontender, no masses Musculoskeletal:  No clubbing/cyanosis of digits Neurological: No cranial nerve deficit on limited exam Psychiatric: Normal judgment/insight Normal mood and affect

## 2023-10-19 NOTE — ED Notes (Signed)
Straight cath performed, removed from bladder.

## 2023-10-19 NOTE — ED Notes (Signed)
Pt is resting, NAD, call light within reach, family at bedside

## 2023-10-19 NOTE — Telephone Encounter (Signed)
Patient's niece called and said that patient is currently hospitalized waiting on a room. York Spaniel that it is possible that she is retaining fluid. Please call back to discuss

## 2023-10-19 NOTE — ED Notes (Signed)
Pt repositioned in bed with 2 pillows. Pt cx of SOB if laid down. Pt is sitting upright comfortably and expressing improvement of breathing with the addition of a extra pillow. NAD,  Call light within reach, bed in lowest position. Family at bedside

## 2023-10-19 NOTE — Assessment & Plan Note (Addendum)
-   I suspect diastolic etiology and acute on chronic CHF given remote history. - The patient had  2D echo on 04/25/2019 24-50 5 to 60%. - He has symptoms of acute CHF together with elevated BNP. - Will be placed on IV Lasix. - We will continue Januvia and Coreg. - We will follow serial troponins.

## 2023-10-19 NOTE — ED Notes (Signed)
[facility] about Warfarin order

## 2023-10-19 NOTE — H&P (Signed)
PATIENT NAME: [name] [name]    MR#:  [id number]  DATE OF BIRTH:  02-19-1935  DATE OF ADMISSION:  10/19/2023  PRIMARY CARE PHYSICIAN: Dale Klemme, MD   Patient is coming from: Home  REQUESTING/REFERRING PHYSICIAN: Chiquita Loth, MD  CHIEF COMPLAINT:   Chief Complaint  Patient presents with   Shortness of Breath    HISTORY OF PRESENT ILLNESS:  [name] [name] is a 88 y.o. African-American female with medical history significant for atrial fibrillation on Coumadin, stage IV chronic kidney disease, GERD, CHF, anxiety, SSS status post pacemaker placement, and type 2 diabetes mellitus, who presented to the emergency room with acute onset of worsening dyspnea with associated midsternal chest pain felt as pressure and graded 10/10 in severity.  She admitted to dry cough with down to wheezing.  She has been having diaphoresis.  No nausea or vomiting.  She admitted to orthopnea and paroxysmal nocturnal dyspnea as well as worsening lower extremity edema.  No headache or dizziness or blurred vision.  No bleeding diathesis.  ED Course: When the patient came to the ER, vital signs were within normal.  Labs revealed creatinine of 33 with a creatinine of 2.42 and blood glucose of 219 AST 56 and ALT 45.  BNP was 577.9 and high sensitive troponin I was 17 twice.  CBC showed mild anemia.  INR was 2.3 and PTT 25.7.  Respiratory panel came back negative. EKG as reviewed by me : EKG showed atrial-ventricular dual paced rhythm with a rate of 63. Imaging: Portable chest x-ray showed suspected multifocal infection/pneumonia, lower lobe predominant and cardiomegaly.  The patient was given 40 mg of IV Lasix, 4 baby aspirin, IV Rocephin and Zithromax, 2 mg of IV morphine sulfate and 4 mg of IV Zofran.  She will be admitted to a progressive unit bed for further evaluation and management. PAST MEDICAL HISTORY:   Past Medical History:  Diagnosis Date   1st degree AV block    a. PR 400 msec with  Apacing   A-fib (HCC)    Hx of   Anemia    Anxiety    Arthritis    knees and lower back    Chest pain    a. 2004 Cath:  reportedly nl;  b. 09/2013 Neg MV.   Chronic diastolic heart failure (HCC)    a. 09/2013 Echo: EF 50-55%, mild LVH, mild to mod MR/TR.   Chronic kidney disease    Stage IV   Colon polyps    Congestive heart disease (HCC)    Esophageal reflux    Glaucoma    Gout    H/O hiatal hernia    History of UTI    Hyperlipidemia    Morbid obesity (HCC)    OSA on CPAP    CPAP   Pacemaker    copy of medtronic card in chart   PAF (paroxysmal atrial fibrillation) (HCC)    Seasonal allergies    SSS (sick sinus syndrome) (HCC)    a. 2008 s/p MDT PPM;  b. 11/2013 Gen change- MDT ADDRL1 Adapta DC PPM, ser # [id number] H.   Symptomatic PVCs    Syncope and collapse    a. Felt to be vasovagal.   Thyroid disease    nodules on thyroid, with goiter, on no meds per pt   Type II diabetes mellitus (HCC)    Type 2   Unspecified essential hypertension    controlled on meds   Unspecified glaucoma(365.9)  Vertigo    in past   Wears dentures    upper full and lower partial    PAST SURGICAL HISTORY:   Past Surgical History:  Procedure Laterality Date   APPENDECTOMY     CARDIAC CATHETERIZATION     CARDIOVERSION N/A 04/26/2017   Procedure: Cardioversion;  Surgeon: Antonieta Iba, MD;  Location: [facility] ORS;  Service: Cardiovascular;  Laterality: N/A;   CARDIOVERSION N/A 10/17/2023   Procedure: CARDIOVERSION;  Surgeon: Antonieta Iba, MD;  Location: [facility] OR[facility];  Service: Cardiovascular;  Laterality: N/A;   CATARACT EXTRACTION W/PHACO Left 07/21/2020   Procedure: CATARACT EXTRACTION PHACO AND INTRAOCULAR LENS PLACEMENT (IOC) LEFT DIABETIC;  Surgeon: Lockie Mola, MD;  Location: [facility];  Service: Ophthalmology;  Laterality: Left;  10.25 1:13.2 14.0%   CATARACT EXTRACTION W/PHACO Right 08/11/2020   Procedure: CATARACT EXTRACTION PHACO AND INTRAOCULAR LENS  PLACEMENT (IOC) RIGHT DIABETIC;  Surgeon: Lockie Mola, MD;  Location: [facility];  Service: Ophthalmology;  Laterality: Right;  11.63 1:25.0 13.7%   DILATION AND CURETTAGE OF UTERUS     INCISION AND DRAINAGE OF WOUND Right 03/1998   "leg; it was like a boil" (12/01/2013)   INSERT / REPLACE / REMOVE PACEMAKER  2008; 12/01/2013   MDT ADDRL1 pacemaker - gen change by Dr Graciela Husbands 12/01/2013   LUNG BIOPSY  2010   unc   PERMANENT PACEMAKER GENERATOR CHANGE N/A 12/01/2013   Procedure: PERMANENT PACEMAKER GENERATOR CHANGE;  Surgeon: Duke Salvia, MD;  Location: [facility] C[facility];  Service: Cardiovascular;  Laterality: N/A;   VAGINAL HYSTERECTOMY  1970    SOCIAL HISTORY:   Social History   Tobacco Use   Smoking status: Former    Current packs/day: 0.00    Average packs/day: 0.5 packs/day for 34.0 years (17.0 ttl pk-yrs)    Types: Cigarettes    Start date: 08/16/1955    Quit date: 08/15/1989    Years since quitting: 34.2   Smokeless tobacco: Never  Substance Use Topics   Alcohol use: No    Alcohol/week: 0.0 standard drinks of alcohol    FAMILY HISTORY:   Family History  Problem Relation Age of Onset   Cancer Mother    Liver cancer Mother    Pancreatic cancer Mother    Heart disease Father    Rheum arthritis Father    Allergies Father    Hypertension Sister    Rheum arthritis Sister    COPD Brother    Breast cancer Maternal Aunt     DRUG ALLERGIES:   Allergies  Allergen Reactions   Metoprolol Other (See Comments)    Bad dreams Patient stated that she had suicidal thoughts while taking this medication.    Diltiazem Other (See Comments)    Patient stated that she had bad dreams with this medication   Levofloxacin Nausea And Vomiting and Nausea Only         REVIEW OF SYSTEMS:   ROS As per history of present illness. All pertinent systems were reviewed above. Constitutional, HEENT, cardiovascular, respiratory, GI, GU, musculoskeletal, neuro, psychiatric,  endocrine, integumentary and hematologic systems were reviewed and are otherwise negative/unremarkable except for positive findings mentioned above in the HPI.   MEDICATIONS AT HOME:   Prior to Admission medications   Medication Sig Start Date End Date Taking? Authorizing Provider  acetaminophen (TYLENOL) 500 MG tablet Take 1,000 mg by mouth every 6 (six) hours as needed for moderate pain.     [provider]  allopurinol (ZYLOPRIM) 100 MG  tablet Take 100 mg by mouth daily. 01/06/21   [provider]  amiodarone (PACERONE) 200 MG tablet TAKE 1 TABLET BY MOUTH EVERY DAY 10/12/23   Lanier Prude, MD  amLODipine (NORVASC) 5 MG tablet Take 5 mg by mouth daily. 08/20/21   [provider]  atorvastatin (LIPITOR) 20 MG tablet TAKE 1 TABLET (20 MG TOTAL) BY MOUTH DAILY. 02/02/17   Ellyn Hack, MD  Blood Glucose Monitoring Suppl (FIFTY50 GLUCOSE METER 2.0) w/Device KIT  07/06/20   [provider]  calcitRIOL (ROCALTROL) 0.25 MCG capsule TAKE 1 CAPSULE (0.25 MCG TOTAL) BY MOUTH 1 (ONE) TIME EACH DAY 05/12/21   [provider]  carvedilol (COREG) 25 MG tablet Take 1-2 tablets (25-50 mg total) by mouth as directed. Take 1 tablet (25MG ) by mouth every morning and 2 tablets (50MG ) by mouth every night before bed Patient taking differently: Take 25-50 mg by mouth See admin instructions. Take 1 tablet (25MG ) by mouth every morning and 2 tablets (50MG ) by mouth every night before bed 02/19/23   Antonieta Iba, MD  ferrous sulfate 325 (65 FE) MG EC tablet Take 325 mg by mouth daily.  03/01/17   [provider]  furosemide (LASIX) 40 MG tablet Take 40 mg by mouth daily.    [provider]  Glycerin, Laxative, (FLEET LIQUID GLYCERIN SUPP RE) Place 1 Dose rectally daily as needed (constipation).    [provider]  KLOR-CON M10 10 MEQ tablet Take 10 mEq by mouth daily. 06/12/18   [provider]  meclizine (ANTIVERT) 25 MG tablet Take  25 mg by mouth 3 (three) times daily as needed for dizziness or nausea. 12/12/21   [provider]  Multiple Vitamins-Minerals (CENTRUM SILVER PO) Take 1 tablet by mouth daily.     [provider]  OneTouch Delica Lancets 33G MISC  07/20/20   [provider]  [facility] VERIO test strip 3 (three) times daily. 06/08/21   [provider]  pantoprazole (PROTONIX) 40 MG tablet Take 40 mg by mouth daily. 11/29/20   [provider]  polyethylene glycol (MIRALAX / GLYCOLAX) packet Take 17 g by mouth daily as needed for moderate constipation.    [provider]  sitaGLIPtin (JANUVIA) 25 MG tablet Take 25 mg by mouth daily. 05/23/21   [provider]  warfarin (COUMADIN) 2 MG tablet Take 2 mg by mouth daily at 6 PM.    [provider]  White Petrolatum-Mineral Oil (ARTIFICIAL TEARS) ointment Place 1 drop into both eyes daily as needed (Dry eyes).    [provider]      VITAL SIGNS:  Blood pressure (!) 126/59, pulse 65, temperature (!) 97.5 F (36.4 C), temperature source Oral, resp. rate 15, height 5\' 4"  (1.626 m), weight 108.6 kg, SpO2 99%.  PHYSICAL EXAMINATION:  Physical Exam  GENERAL:  88 y.o.-year-old African-American female patient lying in the bed with no acute distress.  EYES: Pupils equal, round, reactive to light and accommodation. No scleral icterus. Extraocular muscles intact.  HEENT: Head atraumatic, normocephalic. Oropharynx and nasopharynx clear.  NECK:  Supple, no jugular venous distention. No thyroid enlargement, no tenderness.  LUNGS: Slightly diminished bibasal breath sounds with bibasal crackles.  No use of accessory muscles of respiration.  CARDIOVASCULAR: Regular rate and rhythm, S1, S2 normal. No murmurs, rubs, or gallops.  ABDOMEN: Soft, nondistended, nontender. Bowel sounds present. No organomegaly or mass.  EXTREMITIES: 1-2+ bilateral lower extremity pitting edema with no cyanosis, or clubbing.  NEUROLOGIC: Cranial nerves II through XII are intact. Muscle strength 5/5 in all extremities. Sensation intact. Gait not checked.  PSYCHIATRIC: The patient is alert and oriented x 3.  Normal affect and good eye contact. SKIN: No obvious rash, lesion, or ulcer.   LABORATORY PANEL:   CBC Recent Labs  Lab 10/19/23 0530  WBC 9.9  HGB 11.3*  HCT 34.6*  PLT 157   ------------------------------------------------------------------------------------------------------------------  Chemistries  Recent Labs  Lab 10/19/23 0048 10/19/23 0530  NA 137 138  K 3.6 4.1  CL 102 101  CO2 25 26  GLUCOSE 219* 377*  BUN 33* 34*  CREATININE 2.42* 2.59*  CALCIUM 9.1 9.1  AST 56*  --   ALT 45*  --   ALKPHOS 84  --   BILITOT 0.8  --    ------------------------------------------------------------------------------------------------------------------  Cardiac Enzymes No results for input(s): "TROPONINI" in the last 168 hours. ------------------------------------------------------------------------------------------------------------------  RADIOLOGY:  DG Chest Port 1 View  Result Date: 10/19/2023 CLINICAL DATA:  Shortness of breath EXAM: PORTABLE CHEST 1 VIEW COMPARISON:  05/22/2023 FINDINGS: Faint patchy/interstitial opacities in the lungs bilaterally, suggesting mild multifocal infection/pneumonia, lower lobe predominant. No pleural effusions or pneumothorax. Cardiomegaly.  Right subclavian pacemaker. IMPRESSION: Suspected mild multifocal infection/pneumonia, lower lobe predominant. Cardiomegaly. Electronically Signed   By: Charline Bills M.D.   On: 10/19/2023 01:21      IMPRESSION AND PLAN:  Assessment and Plan: * Multifocal pneumonia - The patient will be admitted to a progressive unit bed - Will continue antibiotic therapy with IV Rocephin and Zithromax. - Mucolytic therapy be provided as well as duo nebs q.i.d. and q.4 hours p.r.n. - We will follow blood cultures.   Acute CHF  (congestive heart failure) (HCC) - I suspect diastolic etiology and acute on chronic CHF given remote history. - The patient had  2D echo on 04/25/2019 24-50 5 to 60%. - He has symptoms of acute CHF together with elevated BNP. - Will be placed on IV Lasix. - We will continue Januvia and Coreg. - We will follow serial troponins.  Essential hypertension - We will continue antihypertensive therapy.  Dyslipidemia - We will continue statin therapy.  Atrial fibrillation and flutter (HCC) - We will continue amiodarone and Coumadin as well as Coreg.    DVT prophylaxis: Coumadin. Advanced Care Planning:  Code Status: full code. Family Communication:  The plan of care was discussed in details with the patient (and family). I answered all questions. The patient agreed to proceed with the above mentioned plan. Further management will depend upon hospital course. Disposition Plan: Back to previous home environment Consults called: none. All the records are reviewed and case discussed with ED provider.  Status is: Inpatient   At the time of the admission, it appears that the appropriate admission status for this patient is inpatient.  This is judged to be reasonable and necessary in order to provide the required intensity of service to ensure the patient's safety given the presenting symptoms, physical exam findings and initial radiographic and laboratory data in the context of comorbid conditions.  The patient requires inpatient status due to high intensity of service, high risk of further deterioration and high frequency of surveillance required.  I certify that at the time of admission, it is my clinical judgment that the patient will require inpatient hospital care extending more than 2 midnights.  Dispo: The patient is from: Home              Anticipated d/c is to: Home              Patient currently is not medically stable to d/c.              Difficult to place  patient: No  Hannah Beat M.D on 10/19/2023 at 9:14 AM  [facility]   From 7 PM-7 AM, contact night-coverage [email or web address]  CC: Primary care physician; Dale Lavina, MD

## 2023-10-19 NOTE — Assessment & Plan Note (Addendum)
-   The patient will be admitted to a progressive unit bed - Will continue antibiotic therapy with IV Rocephin and Zithromax. - Mucolytic therapy be provided as well as duo nebs q.i.d. and q.4 hours p.r.n. - We will follow blood cultures.

## 2023-10-19 NOTE — Assessment & Plan Note (Addendum)
-   We will continue anti-hypertensive therapy. 

## 2023-10-19 NOTE — ED Notes (Signed)
Assisted pt on bedpan. Pt voided 300 ml

## 2023-10-19 NOTE — Hospital Course (Addendum)
HPI: [name] [name] ia a 88 y.o. female w/ PMH atrial fibrillation on Coumadin, stage IV chronic kidney disease, GERD, CHF, anxiety, SSS status post pacemaker placement, and type 2 diabetes mellitus who presented 10/19/23 to ED via EMS from home with worsening shortness of breath <1 day and chest pain. Underwent cardioversion 11/13 w/ Dr Mariah Milling for Aflutter associated w/ dyspnea, converted NSR, discharged. Patient was attempting to go to sleep 11/14 and wearing her CPAP when she could not tolerate lying flat to breathe.   Hospital course / significant events:  11/15: to ED, admitted to hospitalist service for SOB w/ nre O2 requirement likely d/t CHF/CAP. Started diuresis and abx. D/c abx, did not feel strong clinical suspicion for CAP. Echo ordered.  11/16: echo pending read, cardiology to see patient today, diuresed well and rales improved, Cr worse will hold diuretics for now pending cardiology eval. Echo pending read.   Consultants:  none   Procedures/Surgeries: none      ASSESSMENT & PLAN:   Acute CHF (congestive heart failure)  Acute respiratory failure d/t pulmonary edema Pulmonary hypertension  suspect diastolic etiology  Family state she has been drinking more water recently at the advice of her doctors to help her kidney function  She has maintained sinus rhythm as evidenced by AV pacing with appropriate rate response noted on telemetry.  Diuresis - caution w/ CKD --> held for now  continue Januvia and Coreg Strict I&O, daily weight, fluid restriction  Echocardiogram pending read  Supplemental O2 as needed Cardiology following may need to consider right heart catheterization on Monday (would have to hold warfarin)  if pressures stable, will need to consider potential role of amiodarone therapy and ongoing dyspnea.   Persistent atrial flutter Status post cardioversion 11/13. Maintaining sinus rhythm-AV paced with seemingly appropriate rate response with ambulation.   anticoagulated on warfarin with an INR of 3.1. Continue but may need to hold if plan for cardiac cath  Continue amiodarone and beta-blocker.   Multifocal pneumonia ruled out CXR concerning for pneumonia but given no sepsis, no fever, no significant infectious respiratory symptoms such as cough, and w/ reasonable etiology for SOB being dx CHF as above  Will dc abx  Follow blood cultures     AKI on CKD4 Concern for cardiorenal syndrome / diuresis effect  Caution w/ diuresis --> holding for now  OSA CPAP at bedtime   Essential hypertension continue antihypertensive therapy amlodipine, carvedilol    Dyslipidemia Continue home statin therapy.   Atrial fibrillation and flutter (HCC) continue home amiodarone, carvedilol, and Coumadin        obese based on BMI: Body mass index is 41.1 kg/m.  Underweight - under 18.5  normal weight - 18.5 to 24.9 overweight - 25 to 29.9 obese - 30 or more   DVT prophylaxis: coumadin IV fluids: no continuous IV fluids  Nutrition: cardiac/carb Central lines / invasive devices: none  Code Status: FULL CODE ACP documentation reviewed: none on file in [facility]  TOC needs: TBD Barriers to dispo / significant pending items: cardiology eval

## 2023-10-19 NOTE — ED Notes (Signed)
Patient still has not urinated despite use of iv lasix and feeling the need. Bladder scan showed . Mansy, MD notified and wants 1x in and out cath via VO secure chat.

## 2023-10-19 NOTE — Consult Note (Signed)
Pharmacy Consult Note - Anticoagulation  Pharmacy Consult for warfarin (continuation of home therapy) Indication: Atrial fibrillation  PATIENT MEASUREMENTS: Height: 5\' 4"  (162.6 cm) Weight: 108.6 kg (239 lb 6.7 oz) IBW/kg (Calculated) : 54.7 HEPARIN DW (KG): 80.4  VITAL SIGNS: Temp: 98.3 F (36.8 C) (11/15 1521) Temp Source: Oral (11/15 1521) BP: 106/62 (11/15 1500) Pulse Rate: 59 (11/15 1500)  Recent Labs    10/19/23 0223 10/19/23 0530  HGB  --  11.3*  HCT  --  34.6*  PLT  --  157  LABPROT 25.7*  --   INR 2.3*  --   CREATININE  --  2.59*  TROPONINIHS  --  17    Estimated Creatinine Clearance: 19.1 mL/min (A) (by C-G formula based on SCr of 2.59 mg/dL (H)).  PAST MEDICAL HISTORY: Past Medical History:  Diagnosis Date   1st degree AV block    a. PR 400 msec with Apacing   A-fib (HCC)    Hx of   Anemia    Anxiety    Arthritis    knees and lower back    Chest pain    a. 2004 Cath:  reportedly nl;  b. 09/2013 Neg MV.   Chronic diastolic heart failure (HCC)    a. 09/2013 Echo: EF 50-55%, mild LVH, mild to mod MR/TR.   Chronic kidney disease    Stage IV   Colon polyps    Congestive heart disease (HCC)    Esophageal reflux    Glaucoma    Gout    H/O hiatal hernia    History of UTI    Hyperlipidemia    Morbid obesity (HCC)    OSA on CPAP    CPAP   Pacemaker    copy of medtronic card in chart   PAF (paroxysmal atrial fibrillation) (HCC)    Seasonal allergies    SSS (sick sinus syndrome) (HCC)    a. 2008 s/p MDT PPM;  b. 11/2013 Gen change- MDT ADDRL1 Adapta DC PPM, ser # [id number] H.   Symptomatic PVCs    Syncope and collapse    a. Felt to be vasovagal.   Thyroid disease    nodules on thyroid, with goiter, on no meds per pt   Type II diabetes mellitus (HCC)    Type 2   Unspecified essential hypertension    controlled on meds   Unspecified glaucoma(365.9)    Vertigo    in past   Wears dentures    upper full and lower partial    ASSESSMENT: 88  y.o. female with PMH including Afib is presenting with pneumonia. Patient takes warfarin 2 mg PO daily, with her most recent dose prior to admission being on 11/14. INR today is therapeutic.  Pertinent medications: Warfarin 2 mg PO daily Last dose before admission: 11/14 Total weekly warfarin dose: 14 mg INR goal 2-3  Drug-Drug Interactions: N/A  Goal(s) of therapy: INR 2 - 3    Baseline anticoagulation labs: Recent Labs    10/19/23 0048 10/19/23 0223 10/19/23 0530  INR  --  2.3*  --   HGB 11.4*  --  11.3*  PLT 140*  --  157     Date INR Dose  11/15 2.3 2 mg          PLAN: Give warfarin 2 mg PO once daily Monitor INR daily for at least 3 days and may consider less frequent monitoring thereafter if INR continues to remain therapeutic/stable Continue to monitor for drug-drug interactions between warfarin and  any new-start medications   Will M. Dareen Piano, PharmD Clinical Pharmacist 10/19/2023 3:47 PM

## 2023-10-19 NOTE — Assessment & Plan Note (Addendum)
-   We will continue amiodarone and Coumadin as well as Coreg.

## 2023-10-19 NOTE — ED Provider Notes (Signed)
[facility] Provider Note    Event Date/Time   First MD Initiated Contact with Patient 10/19/23 [phone]     (approximate)   History   Shortness of Breath   HPI  [name] [name] is a 88 y.o. female brought to the ED via EMS from home with a chief complaint of shortness of breath.  Patient was seen at cardiology office yesterday and cardioverted.  Presents with progressive shortness of breath x 1 day along with chest pain. Denies fever/chills, abdominal pain, nausea, vomiting or dizziness.     Past Medical History   Past Medical History:  Diagnosis Date  . 1st degree AV block    a. PR 400 msec with Apacing  . A-fib (HCC)    Hx of  . Anemia   . Anxiety   . Arthritis    knees and lower back   . Chest pain    a. 2004 Cath:  reportedly nl;  b. 09/2013 Neg MV.  . Chronic diastolic heart failure (HCC)    a. 09/2013 Echo: EF 50-55%, mild LVH, mild to mod MR/TR.  Marland Kitchen Chronic kidney disease    Stage IV  . Colon polyps   . Congestive heart disease (HCC)   . Esophageal reflux   . Glaucoma   . Gout   . H/O hiatal hernia   . History of UTI   . Hyperlipidemia   . Morbid obesity (HCC)   . OSA on CPAP    CPAP  . Pacemaker    copy of medtronic card in chart  . PAF (paroxysmal atrial fibrillation) (HCC)   . Seasonal allergies   . SSS (sick sinus syndrome) (HCC)    a. 2008 s/p MDT PPM;  b. 11/2013 Gen change- MDT ADDRL1 Adapta DC PPM, ser # [id number] H.  . Symptomatic PVCs   . Syncope and collapse    a. Felt to be vasovagal.  . Thyroid disease    nodules on thyroid, with goiter, on no meds per pt  . Type II diabetes mellitus (HCC)    Type 2  . Unspecified essential hypertension    controlled on meds  . Unspecified glaucoma(365.9)   . Vertigo    in past  . Wears dentures    upper full and lower partial     Active Problem List   Patient Active Problem List   Diagnosis Date Noted  . Anemia due to chronic renal failure treated with erythropoietin,  stage 4 (severe) (HCC) 08/30/2021  . Edema of lower extremity 10/02/2019  . Secondary hyperparathyroidism of renal origin (HCC) 10/02/2019  . Anemia in chronic kidney disease 09/30/2019  . Callus of foot 03/28/2019  . SOB (shortness of breath) 12/01/2018  . Multinodular goiter 08/14/2018  . Encounter for general adult medical examination without abnormal findings 02/27/2018  . Vaccine counseling 02/27/2018  . Morbid obesity due to excess calories (HCC) 10/29/2017  . Adhesive capsulitis of right shoulder 05/04/2017  . Eczema 04/03/2017  . Glaucoma (increased eye pressure) 04/03/2017  . Hiatal hernia 04/03/2017  . Iron deficiency anemia 04/03/2017  . Osteoarthritis 04/03/2017  . Pure hypercholesterolemia 04/03/2017  . Tinnitus 04/03/2017  . Valvular heart disease 04/03/2017  . Venous stasis 04/03/2017  . Bilateral hearing loss 03/07/2017  . Tinnitus, bilateral 03/07/2017  . Pounding noise in right ear 02/13/2017  . Eczematous skin lesions 02/02/2017  . URI with cough and congestion 12/13/2016  . Colicky RLQ abdominal pain 11/01/2016  . Hemoglobin C disease (HCC) 08/02/2016  .  Anxiety 07/28/2016  . Pain of right hip joint 03/23/2016  . CKD (chronic kidney disease) stage 4, GFR 15-29 ml/min (HCC) 02/03/2016  . Chronic gout of right foot 12/16/2015  . Personal history of gout 12/16/2015  . Productive cough 10/26/2015  . Infection of skin of left ear lobe 10/11/2015  . Allergic rhinitis 09/14/2015  . Hyperlipidemia 09/14/2015  . Gout attack 07/21/2015  . Anticoagulation monitoring, INR range 2-3 05/18/2015  . RLS (restless legs syndrome) 04/14/2015  . History of colon polyps 12/23/2014  . Crohn's disease in remission (HCC) 09/15/2014  . 1st degree AV block   . Chronic diastolic heart failure (HCC)   . Cardiac arrhythmia 12/01/2013  . Hemoptysis 11/24/2013  . Acute on chronic diastolic CHF (congestive heart failure) (HCC) 10/17/2013  . Episodic lightheadedness 07/16/2013  .  Palpitations 07/16/2013  . Syncope 08/15/2012  . Back pain 05/30/2012  . OSA (obstructive sleep apnea) 01/08/2012  . DYSPNEA 01/28/2010  . PVC (premature ventricular contraction) 09/13/2009  . Flutter-fibrillation (HCC) 09/13/2009  . EDEMA 08/23/2009  . Diabetes mellitus type 2, controlled, without complications (HCC) 04/13/2009  . Unspecified glaucoma 04/13/2009  . Essential hypertension 04/13/2009  . PAF (paroxysmal atrial fibrillation) (HCC) 04/13/2009  . SICK SINUS/ TACHY-BRADY SYNDROME 04/13/2009  . GERD 04/13/2009  . Cardiac pacemaker - Medtronic 04/13/2009     Past Surgical History   Past Surgical History:  Procedure Laterality Date  . APPENDECTOMY    . CARDIAC CATHETERIZATION    . CARDIOVERSION N/A 04/26/2017   Procedure: Cardioversion;  Surgeon: Antonieta Iba, MD;  Location: [facility] ORS;  Service: Cardiovascular;  Laterality: N/A;  . CARDIOVERSION N/A 10/17/2023   Procedure: CARDIOVERSION;  Surgeon: Antonieta Iba, MD;  Location: [facility] ORS;  Service: Cardiovascular;  Laterality: N/A;  . CATARACT EXTRACTION W/PHACO Left 07/21/2020   Procedure: CATARACT EXTRACTION PHACO AND INTRAOCULAR LENS PLACEMENT (IOC) LEFT DIABETIC;  Surgeon: Lockie Mola, MD;  Location: [facility];  Service: Ophthalmology;  Laterality: Left;  10.25 1:13.2 14.0%  . CATARACT EXTRACTION W/PHACO Right 08/11/2020   Procedure: CATARACT EXTRACTION PHACO AND INTRAOCULAR LENS PLACEMENT (IOC) RIGHT DIABETIC;  Surgeon: Lockie Mola, MD;  Location: [facility];  Service: Ophthalmology;  Laterality: Right;  11.63 1:25.0 13.7%  . DILATION AND CURETTAGE OF UTERUS    . INCISION AND DRAINAGE OF WOUND Right 03/1998   "leg; it was like a boil" (12/01/2013)  . INSERT / REPLACE / REMOVE PACEMAKER  2008; 12/01/2013   MDT ADDRL1 pacemaker - gen change by Dr Graciela Husbands 12/01/2013  . LUNG BIOPSY  2010   [facility]  . PERMANENT PACEMAKER GENERATOR CHANGE N/A 12/01/2013   Procedure: PERMANENT  PACEMAKER GENERATOR CHANGE;  Surgeon: Duke Salvia, MD;  Location: [facility] C[facility];  Service: Cardiovascular;  Laterality: N/A;  . VAGINAL HYSTERECTOMY  1970     Home Medications   Prior to Admission medications   Medication Sig Start Date End Date Taking? Authorizing Provider  acetaminophen (TYLENOL) 500 MG tablet Take 1,000 mg by mouth every 6 (six) hours as needed for moderate pain.     [provider]  allopurinol (ZYLOPRIM) 100 MG tablet Take 100 mg by mouth daily. 01/06/21   [provider]  amiodarone (PACERONE) 200 MG tablet TAKE 1 TABLET BY MOUTH EVERY DAY 10/12/23   Lanier Prude, MD  amLODipine (NORVASC) 5 MG tablet Take 5 mg by mouth daily. 08/20/21   [provider]  atorvastatin (LIPITOR) 20 MG tablet TAKE 1 TABLET (20 MG TOTAL) BY  MOUTH DAILY. 02/02/17   Ellyn Hack, MD  Blood Glucose Monitoring Suppl (FIFTY50 GLUCOSE METER 2.0) w/Device KIT  07/06/20   [provider]  calcitRIOL (ROCALTROL) 0.25 MCG capsule TAKE 1 CAPSULE (0.25 MCG TOTAL) BY MOUTH 1 (ONE) TIME EACH DAY 05/12/21   [provider]  carvedilol (COREG) 25 MG tablet Take 1-2 tablets (25-50 mg total) by mouth as directed. Take 1 tablet (25MG ) by mouth every morning and 2 tablets (50MG ) by mouth every night before bed Patient taking differently: Take 25-50 mg by mouth See admin instructions. Take 1 tablet (25MG ) by mouth every morning and 2 tablets (50MG ) by mouth every night before bed 02/19/23   Antonieta Iba, MD  ferrous sulfate 325 (65 FE) MG EC tablet Take 325 mg by mouth daily.  03/01/17   [provider]  furosemide (LASIX) 40 MG tablet Take 40 mg by mouth daily.    [provider]  Glycerin, Laxative, (FLEET LIQUID GLYCERIN SUPP RE) Place 1 Dose rectally daily as needed (constipation).    [provider]  KLOR-CON M10 10 MEQ tablet Take 10 mEq by mouth daily. 06/12/18   [provider]  meclizine (ANTIVERT) 25 MG tablet Take 25 mg  by mouth 3 (three) times daily as needed for dizziness or nausea. 12/12/21   [provider]  Multiple Vitamins-Minerals (CENTRUM SILVER PO) Take 1 tablet by mouth daily.     [provider]  OneTouch Delica Lancets 33G MISC  07/20/20   [provider]  [facility] VERIO test strip 3 (three) times daily. 06/08/21   [provider]  pantoprazole (PROTONIX) 40 MG tablet Take 40 mg by mouth daily. 11/29/20   [provider]  polyethylene glycol (MIRALAX / GLYCOLAX) packet Take 17 g by mouth daily as needed for moderate constipation.    [provider]  sitaGLIPtin (JANUVIA) 25 MG tablet Take 25 mg by mouth daily. 05/23/21   [provider]  warfarin (COUMADIN) 2 MG tablet Take 2 mg by mouth daily at 6 PM.    [provider]  White Petrolatum-Mineral Oil (ARTIFICIAL TEARS) ointment Place 1 drop into both eyes daily as needed (Dry eyes).    [provider]     Allergies  Metoprolol, Diltiazem, and Levofloxacin   Family History   Family History  Problem Relation Age of Onset  . Cancer Mother   . Liver cancer Mother   . Pancreatic cancer Mother   . Heart disease Father   . Rheum arthritis Father   . Allergies Father   . Hypertension Sister   . Rheum arthritis Sister   . COPD Brother   . Breast cancer Maternal Aunt      Physical Exam  Triage Vital Signs: ED Triage Vitals [10/19/23 0045]  Encounter Vitals Group     BP 130/86     Systolic BP Percentile      Diastolic BP Percentile      Pulse Rate 61     Resp 16     Temp      Temp src      SpO2 94 %     Weight 239 lb 6.7 oz (108.6 kg)     Height 5\' 4"  (1.626 m)     Head Circumference      Peak Flow      Pain Score 8     Pain Loc      Pain Education      Exclude from [employer]  Chart     Updated Vital Signs: BP 130/86   Pulse 61   Temp 97.8 F (36.6 C) (Oral)   Resp 16   Ht 5\' 4"  (1.626 m)   Wt 108.6 kg   SpO2 94%   BMI 41.10 kg/m     General: Awake, moderate distress.  CV:  RRR.  Good peripheral perfusion.  Resp:  Normal effort.  Diminished aeration, otherwise CTAB. Abd:  Nontender.  No distention.  Other:  1+ nonpitting BLE edema.   ED Results / Procedures / Treatments  Labs (all labs ordered are listed, but only abnormal results are displayed) Labs Reviewed  CBC WITH DIFFERENTIAL/PLATELET - Abnormal; Notable for the following components:      Result Value   Hemoglobin 11.4 (*)    HCT 33.7 (*)    MCV 79.7 (*)    Platelets 140 (*)    All other components within normal limits  COMPREHENSIVE METABOLIC PANEL - Abnormal; Notable for the following components:   Glucose, Bld 219 (*)    BUN 33 (*)    Creatinine, Ser 2.42 (*)    AST 56 (*)    ALT 45 (*)    GFR, Estimated 19 (*)    All other components within normal limits  BRAIN NATRIURETIC PEPTIDE - Abnormal; Notable for the following components:   B Natriuretic Peptide 577.9 (*)    All other components within normal limits  RESP PANEL BY RT-PCR (RSV, FLU A&B, COVID)  RVPGX2  CULTURE, BLOOD (ROUTINE X 2)  CULTURE, BLOOD (ROUTINE X 2)  PROTIME-INR  LACTIC ACID, PLASMA  LACTIC ACID, PLASMA  TROPONIN I (HIGH SENSITIVITY)     EKG  ED ECG REPORT I, Demeka Sutter J, the attending physician, personally viewed and interpreted this ECG.   Date: 10/19/2023  EKG Time: 0046  Rate: 63  Rhythm: Paced  Axis: Paced  Intervals: Paced  ST&T Change: Paced    RADIOLOGY I have independently visualized and interpreted patient's imaging study as well as noted the radiology interpretation:  Chest x-ray: Suspected multifocal pneumonia, cardiomegaly  Official radiology report(s): DG Chest Port 1 View  Result Date: 10/19/2023 CLINICAL DATA:  Shortness of breath EXAM: PORTABLE CHEST 1 VIEW COMPARISON:  05/22/2023 FINDINGS: Faint patchy/interstitial opacities in the lungs bilaterally, suggesting mild multifocal infection/pneumonia, lower lobe predominant. No pleural  effusions or pneumothorax. Cardiomegaly.  Right subclavian pacemaker. IMPRESSION: Suspected mild multifocal infection/pneumonia, lower lobe predominant. Cardiomegaly. Electronically Signed   By: Charline Bills M.D.   On: 10/19/2023 01:21     PROCEDURES:  Critical Care performed: Yes, see critical care procedure note(s)  CRITICAL CARE Performed by: Irean Hong   Total critical care time: 30 minutes  Critical care time was exclusive of separately billable procedures and treating other patients.  Critical care was necessary to treat or prevent imminent or life-threatening deterioration.  Critical care was time spent personally by me on the following activities: development of treatment plan with patient and/or surrogate as well as nursing, discussions with consultants, evaluation of patient's response to treatment, examination of patient, obtaining history from patient or surrogate, ordering and performing treatments and interventions, ordering and review of laboratory studies, ordering and review of radiographic studies, pulse oximetry and re-evaluation of patient's condition.   Marland Kitchen1-3 Lead EKG Interpretation  Performed by: Irean Hong, MD Authorized by: Irean Hong, MD     Interpretation: normal     ECG rate:  65   ECG rate assessment: normal     Rhythm: sinus  rhythm     Ectopy: none     Conduction: normal   Comments:     Patient placed on cardiac monitor to evaluate for arrhythmias    MEDICATIONS ORDERED IN ED: Medications  cefTRIAXone (ROCEPHIN) 1 g in sodium chloride 0.9 % 100 mL IVPB (has no administration in time range)  azithromycin (ZITHROMAX) 500 mg in dextrose 5 % 250 mL IVPB (has no administration in time range)  aspirin chewable tablet 324 mg (324 mg Oral Given 10/19/23 0056)  morphine (PF) 2 MG/ML injection 2 mg (2 mg Intravenous Given 10/19/23 0055)  ondansetron (ZOFRAN) injection 4 mg (4 mg Intravenous Given 10/19/23 0054)  furosemide (LASIX) injection 40 mg  (40 mg Intravenous Given 10/19/23 0141)     IMPRESSION / MDM / ASSESSMENT AND PLAN / ED COURSE  I reviewed the triage vital signs and the nursing notes.                             88 year old female presenting with shortness of breath. Differential includes, but is not limited to, viral syndrome, bronchitis including COPD exacerbation, pneumonia, reactive airway disease including asthma, CHF including exacerbation with or without pulmonary/interstitial edema, pneumothorax, ACS, thoracic trauma, and pulmonary embolism.  Personally reviewed patient's records and note her cardiology office visit from yesterday.  Patient's presentation is most consistent with acute presentation with potential threat to life or bodily function.  The patient is on the cardiac monitor to evaluate for evidence of arrhythmia and/or significant heart rate changes.  Laboratory results demonstrate normal WBC 7.4, slight renal insufficiency, initial troponin 17.  Chest x-ray demonstrates volume overload/multifocal pneumonia.  Will add blood cultures, lactic acid, start broad-spectrum IV antibiotics.  Patient placed on nasal cannula oxygen for room air saturation 92%.  Will consult hospital services for evaluation and admission.      FINAL CLINICAL IMPRESSION(S) / ED DIAGNOSES   Final diagnoses:  Shortness of breath  Congestive heart failure, unspecified HF chronicity, unspecified heart failure type (HCC)  Community acquired pneumonia, unspecified laterality     Rx / DC Orders   ED Discharge Orders     None        Note:  This document was prepared using Dragon voice recognition software and may include unintentional dictation errors.   Irean Hong, MD 10/19/23 [phone]

## 2023-10-19 NOTE — ED Notes (Signed)
Pt stated she felt nauseated and wanted her medication she takes at home for sleep

## 2023-10-19 NOTE — ED Notes (Signed)
Pt reconnected to cardiac monitor

## 2023-10-20 ENCOUNTER — Encounter: Payer: Self-pay | Admitting: Osteopathic Medicine

## 2023-10-20 ENCOUNTER — Inpatient Hospital Stay (HOSPITAL_COMMUNITY)
Admit: 2023-10-20 | Discharge: 2023-10-20 | Disposition: A | Discharge status: Home / Self Care | Payer: PPO | Attending: Osteopathic Medicine | Admitting: Osteopathic Medicine

## 2023-10-20 DIAGNOSIS — N184 Chronic kidney disease, stage 4 (severe): Secondary | ICD-10-CM

## 2023-10-20 DIAGNOSIS — I5033 Acute on chronic diastolic (congestive) heart failure: Secondary | ICD-10-CM

## 2023-10-20 DIAGNOSIS — I5031 Acute diastolic (congestive) heart failure: Secondary | ICD-10-CM | POA: Diagnosis not present

## 2023-10-20 DIAGNOSIS — I509 Heart failure, unspecified: Secondary | ICD-10-CM | POA: Diagnosis not present

## 2023-10-20 DIAGNOSIS — R9431 Abnormal electrocardiogram [ECG] [EKG]: Secondary | ICD-10-CM

## 2023-10-20 DIAGNOSIS — I4892 Unspecified atrial flutter: Secondary | ICD-10-CM | POA: Diagnosis not present

## 2023-10-20 DIAGNOSIS — I272 Pulmonary hypertension, unspecified: Secondary | ICD-10-CM

## 2023-10-20 LAB — BASIC METABOLIC PANEL
Anion gap: 12 (ref 5–15)
BUN: 39 mg/dL — ABNORMAL HIGH (ref 8–23)
CO2: 27 mmol/L (ref 22–32)
Calcium: 9.1 mg/dL (ref 8.9–10.3)
Chloride: 101 mmol/L (ref 98–111)
Creatinine, Ser: 2.85 mg/dL — ABNORMAL HIGH (ref 0.44–1.00)
GFR, Estimated: 16 mL/min — ABNORMAL LOW (ref >60)
Glucose, Bld: 186 mg/dL — ABNORMAL HIGH (ref 70–99)
Potassium: 3.9 mmol/L (ref 3.5–5.1)
Sodium: 140 mmol/L (ref 135–145)

## 2023-10-20 LAB — ECHOCARDIOGRAM COMPLETE
AR max vel: 1.84 cm2
AV Peak grad: 9.7 mm[Hg]
Ao pk vel: 1.56 m/s
Area-P 1/2: 3.34 cm2
Height: 64 in
S' Lateral: 2.9 cm
Weight: 3830.71 [oz_av]

## 2023-10-20 LAB — GLUCOSE, CAPILLARY
Glucose-Capillary: 218 mg/dL — ABNORMAL HIGH (ref 70–99)
Glucose-Capillary: 197 mg/dL — ABNORMAL HIGH (ref 70–99)
Glucose-Capillary: 138 mg/dL — ABNORMAL HIGH (ref 70–99)
Glucose-Capillary: 160 mg/dL — ABNORMAL HIGH (ref 70–99)

## 2023-10-20 LAB — PROTIME-INR
INR: 3.1 — ABNORMAL HIGH (ref 0.8–1.2)
Prothrombin Time: 32.4 s — ABNORMAL HIGH (ref 11.4–15.2)

## 2023-10-20 MED ORDER — WARFARIN SODIUM 1 MG PO TABS
1.0000 mg | ORAL_TABLET | Freq: Every day | ORAL | Status: DC
  Administered 2023-10-20: 1 mg via ORAL
  Filled 2023-10-20: qty 1

## 2023-10-20 MED ORDER — IPRATROPIUM-ALBUTEROL 0.5-2.5 (3) MG/3ML IN SOLN
3.0000 mL | Freq: Three times a day (TID) | RESPIRATORY_TRACT | Status: DC
  Administered 2023-10-20 – 2023-10-21 (×3): 3 mL via RESPIRATORY_TRACT
  Filled 2023-10-20 (×3): qty 3

## 2023-10-20 NOTE — Progress Notes (Signed)
  Echocardiogram 2D Echocardiogram has been performed.  [name] [name] 10/20/2023, 10:28 AM

## 2023-10-20 NOTE — Consult Note (Signed)
Pharmacy Consult Note - Anticoagulation  Pharmacy Consult for warfarin (continuation of home therapy) Indication: Atrial fibrillation  PATIENT MEASUREMENTS: Height: 5\' 4"  (162.6 cm) Weight: 108.6 kg (239 lb 6.7 oz) IBW/kg (Calculated) : 54.7 HEPARIN DW (KG): 80.4  VITAL SIGNS: Temp: 98 F (36.7 C) (11/16 1223) Temp Source: Oral (11/16 0428) BP: 122/65 (11/16 1223) Pulse Rate: 60 (11/16 1223)  Recent Labs    10/19/23 0530 10/20/23 0516  HGB 11.3*  --   HCT 34.6*  --   PLT 157  --   LABPROT  --  32.4*  INR  --  3.1*  CREATININE 2.59* 2.85*  TROPONINIHS 17  --     Estimated Creatinine Clearance: 17.4 mL/min (A) (by C-G formula based on SCr of 2.85 mg/dL (H)).  PAST MEDICAL HISTORY: Past Medical History:  Diagnosis Date   1st degree AV block    a. PR 400 msec with Apacing   A-fib (HCC)    Hx of   Anemia    Anxiety    Arthritis    knees and lower back    Chest pain    a. 2004 Cath:  reportedly nl;  b. 09/2013 Neg MV; c. 11/2017 nl ETT; d. 08/2023 MV: No isch/infarct. nl LV fxn. Mild Ao Ca2+.   Chronic diastolic heart failure (HCC)    a. 09/2013 Echo: EF 50-55%, mild LVH, mild to mod MR/TR; b. 04/2023 Echo: EF 55-60%, no rwma, nl RV size/fxn, RVSP 44.69mmHg. Mod dil LA. Mod MR/TR.   CKD (chronic kidney disease), stage IV (HCC)    Colon polyps    Congestive heart disease (HCC)    Esophageal reflux    Glaucoma    Gout    H/O hiatal hernia    History of UTI    Hyperlipidemia    Moderate mitral regurgitation    a. 04/2023 Echo: Mod MR.   Morbid obesity (HCC)    OSA (obstructive sleep apnea)    OSA on CPAP    CPAP   Pacemaker    copy of medtronic card in chart   PAF (paroxysmal atrial fibrillation) (HCC)    a. 10/2023 s/p DCCV (150J); b. CHA2DS2VASc = 5-->warfarin.   Primary hypertension    controlled on meds   Pulmonary hypertension (HCC)    a. 04/2023 Echo: RVSP 44.59mmHg.   Seasonal allergies    SSS (sick sinus syndrome) (HCC)    a. 2008 s/p MDT PPM;  b.  11/2013 Gen change- MDT ADDRL1 Adapta DC PPM, ser # [id number] H.   Symptomatic PVCs    Syncope and collapse    a. Felt to be vasovagal.   Thyroid disease    nodules on thyroid, with goiter, on no meds per pt   Tricuspid regurgitation    Type II diabetes mellitus (HCC)    Type 2   Unspecified glaucoma(365.9)    Vertigo    in past   Wears dentures    upper full and lower partial    ASSESSMENT: 88 y.o. female with PMH including Afib is presenting with pneumonia. Patient takes warfarin 2 mg PO daily, with her most recent dose prior to admission being on 11/14. INR today is therapeutic.  Pertinent medications: Warfarin 2 mg PO daily Last dose before admission: 11/14 Total weekly warfarin dose: 14 mg INR goal 2-3  Drug-Drug Interactions: Amiodarone(on both PTA), azithromycin x1 11/15  Goal(s) of therapy: INR 2 - 3    Baseline anticoagulation labs: Recent Labs    10/19/23 0048 10/19/23 0223 10/19/23  0530 10/20/23 0516  INR  --  2.3*  --  3.1*  HGB 11.4*  --  11.3*  --   PLT 140*  --  157  --      Date INR Dose  11/15 2.3 2 mg  11/16 3.1               PLAN: INR 3.1   slightly Supratherapeutic.  Will order warfarin 1 mg PO x1 today Monitor INR daily for at least 3 days and may consider less frequent monitoring thereafter if INR continues to remain therapeutic/stable Continue to monitor for drug-drug interactions between warfarin and any new-start medications   Bari Mantis PharmD Clinical Pharmacist 10/20/2023

## 2023-10-20 NOTE — Plan of Care (Signed)
  Problem: Education: Goal: Knowledge of General Education information will improve Description: Including pain rating scale, medication(s)/side effects and non-pharmacologic comfort measures Outcome: Progressing   Problem: Health Behavior/Discharge Planning: Goal: Ability to manage health-related needs will improve Outcome: Progressing   Problem: Clinical Measurements: Goal: Ability to maintain clinical measurements within normal limits will improve Outcome: Progressing Goal: Will remain free from infection Outcome: Progressing Goal: Diagnostic test results will improve Outcome: Progressing Goal: Respiratory complications will improve Outcome: Progressing Goal: Cardiovascular complication will be avoided Outcome: Progressing   Problem: Activity: Goal: Risk for activity intolerance will decrease Outcome: Progressing   Problem: Nutrition: Goal: Adequate nutrition will be maintained Outcome: Progressing   Problem: Coping: Goal: Level of anxiety will decrease Outcome: Progressing   Problem: Elimination: Goal: Will not experience complications related to bowel motility Outcome: Progressing Goal: Will not experience complications related to urinary retention Outcome: Progressing   Problem: Pain Management: Goal: General experience of comfort will improve Outcome: Progressing   Problem: Safety: Goal: Ability to remain free from injury will improve Outcome: Progressing   Problem: Skin Integrity: Goal: Risk for impaired skin integrity will decrease Outcome: Progressing   Problem: Education: Goal: Ability to describe self-care measures that may prevent or decrease complications (Diabetes Survival Skills Education) will improve Outcome: Progressing Goal: Individualized Educational Video(s) Outcome: Progressing   Problem: Coping: Goal: Ability to adjust to condition or change in health will improve Outcome: Progressing   Problem: Fluid Volume: Goal: Ability to  maintain a balanced intake and output will improve Outcome: Progressing   Problem: Health Behavior/Discharge Planning: Goal: Ability to identify and utilize available resources and services will improve Outcome: Progressing Goal: Ability to manage health-related needs will improve Outcome: Progressing   Problem: Metabolic: Goal: Ability to maintain appropriate glucose levels will improve Outcome: Progressing   Problem: Nutritional: Goal: Maintenance of adequate nutrition will improve Outcome: Progressing Goal: Progress toward achieving an optimal weight will improve Outcome: Progressing   Problem: Skin Integrity: Goal: Risk for impaired skin integrity will decrease Outcome: Progressing   Problem: Tissue Perfusion: Goal: Adequacy of tissue perfusion will improve Outcome: Progressing   Problem: Education: Goal: Ability to demonstrate management of disease process will improve Outcome: Progressing Goal: Ability to verbalize understanding of medication therapies will improve Outcome: Progressing Goal: Individualized Educational Video(s) Outcome: Progressing   Problem: Activity: Goal: Capacity to carry out activities will improve Outcome: Progressing   Problem: Cardiac: Goal: Ability to achieve and maintain adequate cardiopulmonary perfusion will improve Outcome: Progressing

## 2023-10-20 NOTE — Consult Note (Signed)
Cardiology Consult    Patient ID: [name] [name] MRN: [id number], DOB/AGE: [birth year]   Admit date: 10/19/2023 Date of Consult: 10/20/2023  Primary Physician: Dale [place], MD Primary Cardiologist: [name], MD Requesting Provider: Dorris Carnes. Lyn Hollingshead, DO  Patient Profile    [name] [name] is a 88 y.o. female with a history of w/ a h/o PAF on amio/warfarin, chest pain w/ nl cors on remote cath, HTN, HL, DMII, CKD IV, chronic HFpEF, pulm HTN, mod MR/TR, symptomatic PVCs, OSA on CPAP, and SSS s/p MDT PPM, who is being seen today for the evaluation of dyspnea and CHF at the request of Dr. Lyn [name].  Past Medical History  Subjective  Past Medical History:  Diagnosis Date   1st degree AV block    a. PR 400 msec with Apacing   A-fib (HCC)    Hx of   Anemia    Anxiety    Arthritis    knees and lower back    Chest pain    a. 2004 Cath:  reportedly nl;  b. 09/2013 Neg MV; c. 11/2017 nl ETT; d. 08/2023 MV: No isch/infarct. nl LV fxn. Mild Ao Ca2+.   Chronic diastolic heart failure (HCC)    a. 09/2013 Echo: EF 50-55%, mild LVH, mild to mod MR/TR; b. 04/2023 Echo: EF 55-60%, no rwma, nl RV size/fxn, RVSP 44.16mmHg. Mod dil LA. Mod MR/TR.   CKD (chronic kidney disease), stage IV (HCC)    Colon polyps    Congestive heart disease (HCC)    Esophageal reflux    Glaucoma    Gout    H/O hiatal hernia    History of UTI    Hyperlipidemia    Moderate mitral regurgitation    a. 04/2023 Echo: Mod MR.   Morbid obesity (HCC)    OSA (obstructive sleep apnea)    OSA on CPAP    CPAP   Pacemaker    copy of medtronic card in chart   PAF (paroxysmal atrial fibrillation) (HCC)    a. 10/2023 s/p DCCV (150J); b. CHA2DS2VASc = 5-->warfarin.   Primary hypertension    controlled on meds   Pulmonary hypertension (HCC)    a. 04/2023 Echo: RVSP 44.23mmHg.   Seasonal allergies    SSS (sick sinus syndrome) (HCC)    a. 2008 s/p MDT PPM;  b. 11/2013 Gen change- MDT ADDRL1 Adapta DC PPM, ser # [id number].    Symptomatic PVCs    Syncope and collapse    a. Felt to be vasovagal.   Thyroid disease    nodules on thyroid, with goiter, on no meds per pt   Tricuspid regurgitation    Type II diabetes mellitus (HCC)    Type 2   Unspecified glaucoma(365.9)    Vertigo    in past   Wears dentures    upper full and lower partial    Past Surgical History:  Procedure Laterality Date   APPENDECTOMY     CARDIAC CATHETERIZATION     CARDIOVERSION N/A 04/26/2017   Procedure: Cardioversion;  Surgeon: Antonieta Iba, MD;  Location: [facility] OR[facility];  Service: Cardiovascular;  Laterality: N/A;   CARDIOVERSION N/A 10/17/2023   Procedure: CARDIOVERSION;  Surgeon: Antonieta Iba, MD;  Location: [facility];  Service: Cardiovascular;  Laterality: N/A;   CATARACT EXTRACTION W/PHACO Left 07/21/2020   Procedure: CATARACT EXTRACTION PHACO AND INTRAOCULAR LENS PLACEMENT (IOC) LEFT DIABETIC;  Surgeon: Lockie Mola, MD;  Location: [facility];  Service: Ophthalmology;  Laterality: Left;  10.25 1:13.2 14.0%  CATARACT EXTRACTION W/PHACO Right 08/11/2020   Procedure: CATARACT EXTRACTION PHACO AND INTRAOCULAR LENS PLACEMENT (IOC) RIGHT DIABETIC;  Surgeon: Lockie Mola, MD;  Location: [facility];  Service: Ophthalmology;  Laterality: Right;  11.63 1:25.0 13.7%   DILATION AND CURETTAGE OF UTERUS     INCISION AND DRAINAGE OF WOUND Right 03/1998   "leg; it was like a boil" (12/01/2013)   INSERT / REPLACE / REMOVE PACEMAKER  2008; 12/01/2013   MDT ADDRL1 pacemaker - gen change by Dr Graciela Husbands 12/01/2013   LUNG BIOPSY  2010   [facility]   PERMANENT PACEMAKER GENERATOR CHANGE N/A 12/01/2013   Procedure: PERMANENT PACEMAKER GENERATOR CHANGE;  Surgeon: Duke Salvia, MD;  Location: [facility] C[facility];  Service: Cardiovascular;  Laterality: N/A;   VAGINAL HYSTERECTOMY  1970     Allergies  Allergies  Allergen Reactions   Metoprolol Other (See Comments)    Bad dreams Patient stated that she had suicidal thoughts  while taking this medication.    Diltiazem Other (See Comments)    Patient stated that she had bad dreams with this medication   Levofloxacin Nausea And Vomiting and Nausea Only           History of Present Illness     88 y/o ? w/ a h/o PAF on amio/warfarin, chest pain w/ nl cors on remote cath, HTN, HL, DMII, CKD IV, chronic HFpEF, pulm HTN, mod MR/TR, symptomatic PVCs, OSA on CPAP, and SSS s/p MDT PPM.  She's been having DOE dating back to ~ 02/2023.  Echo in 04/2023 showed nl LV fxn w/ RVSP of 44.  MV 08/2023 was nonischemic.  [name] was seen by Dr. Mariah Milling on 11/4 w/ complaints of progressive dyspnea on exertion and lower ext edema.  Device interrogation revealed persistent atrial flutter starting just over a month prior to visit.  Amiodarone therapy was continued and INR was therapeutic.  She underwent successful DCCV on 11/13.  She awoke the following morning feeling well but throughout the day, she noted recurrent DOE.  Upon trying to go to bed on the night of the 15th, she had orthopnea despite using CPAP, and also noted palpitations. She called EMS and was taken to the ED.   Here, she was afebrile and normotensive.  ECG - AV paced, 64.  CXR w/ Suspected mild multifocal infection/pneumonia, lower lobe predominant. Cardiomegaly.  Labs w/ stable microcytic anemia, stable renal fxn w/ BUN/Creat 33/2.42. BNP 577.9.  HsTrop 17  17.  INR rx.  She was admitted and placed on azithromycin and ceftriaxone, alon gw/ lasi x40 IV x 3 doses on 11/15.  She has had good UO w/ minus 2.1 L since admission recorded (though intake appear inaccurate).  BUN and creat have subsequently risin to 39/2.85.  Current recorded wt is lowest that she's been in six mos and 3.5 kg lower than 11/4 clinic vist.  She still notes DOE w/ ambulation w/ some improvement in ankle/pedal edema.  Inpatient Medications  Subjective    amiodarone  200 mg Oral Daily   amLODipine  5 mg Oral Daily   atorvastatin  20 mg Oral Daily    calcitRIOL  0.25 mcg Oral Daily   carvedilol  25 mg Oral Daily   carvedilol  50 mg Oral QHS   ferrous sulfate  325 mg Oral Daily   guaiFENesin  600 mg Oral BID   insulin aspart  0-5 Units Subcutaneous QHS   insulin aspart  0-9 Units Subcutaneous TID WC   ipratropium-albuterol  3 mL Nebulization TID   linagliptin  5 mg Oral Daily   multivitamin with minerals  1 tablet Oral Daily   pantoprazole  40 mg Oral Daily   potassium chloride  10 mEq Oral Daily   warfarin  1 mg Oral q1800   Warfarin - Physician Dosing Inpatient   Does not apply q6    Family History    Family History  Problem Relation Age of Onset   Cancer Mother    Liver cancer Mother    Pancreatic cancer Mother    Heart disease Father    Rheum arthritis Father    Allergies Father    Hypertension Sister    Rheum arthritis Sister    COPD Brother    Breast cancer Maternal Aunt    She indicated that her mother is deceased. She indicated that her father is deceased. She indicated that her sister is deceased. She indicated that the status of her brother is unknown. She indicated that her maternal grandmother is deceased. She indicated that her maternal grandfather is deceased. She indicated that her paternal grandmother is deceased. She indicated that her paternal grandfather is deceased. She indicated that the status of her maternal aunt is unknown.   Social History    Social History   Socioeconomic History   Marital status: Widowed    Spouse name: Not on file   Number of children: Y   Years of education: Not on file   Highest education level: Not on file  Occupational History   Occupation: retired  Tobacco Use   Smoking status: Former    Current packs/day: 0.00    Average packs/day: 0.5 packs/day for 34.0 years (17.0 ttl pk-yrs)    Types: Cigarettes    Start date: 08/16/1955    Quit date: 08/15/1989    Years since quitting: 34.2   Smokeless tobacco: Never  Vaping Use   Vaping status: Never Used  Substance  and Sexual Activity   Alcohol use: No    Alcohol/week: 0.0 standard drinks of alcohol   Drug use: No   Sexual activity: Never  Other Topics Concern   Not on file  Social History Narrative   Retired. Widowed. Regularly exercises.    Social Determinants of Health   Financial Resource Strain: Unknown (12/01/2018)   Overall Financial Resource Strain (CARDIA)    Difficulty of Paying Living Expenses: Patient declined  Food Insecurity: Patient Declined (10/19/2023)   Hunger Vital Sign    Worried About Running Out of Food in the Last Year: Patient declined    Ran Out of Food in the Last Year: Patient declined  Transportation Needs: Patient Declined (10/19/2023)   PRAPARE - Administrator, Civil Service (Medical): Patient declined    Lack of Transportation (Non-Medical): Patient declined  Physical Activity: Unknown (12/01/2018)   Exercise Vital Sign    Days of Exercise per Week: Patient declined    Minutes of Exercise per Session: Patient declined  Stress: Unknown (12/01/2018)   [employer] of Occupational Health - Occupational Stress Questionnaire    Feeling of Stress : Patient declined  Social Connections: Unknown (12/01/2018)   Social Connection and Isolation Panel [NHANES]    Frequency of Communication with Friends and Family: Patient declined    Frequency of Social Gatherings with Friends and Family: Patient declined    Attends Religious Services: Patient declined    Database administrator or Organizations: Patient declined    Attends Banker Meetings: Patient declined  Marital Status: Patient declined  Intimate Partner Violence: Patient Declined (10/19/2023)   Humiliation, Afraid, Rape, and Kick questionnaire    Fear of Current or Ex-Partner: Patient declined    Emotionally Abused: Patient declined    Physically Abused: Patient declined    Sexually Abused: Patient declined     Review of Systems    General:  No chills, fever, night sweats or  weight changes.  Cardiovascular:  No chest pain, +++ dyspnea on exertion, +++ lower extremity edema, no orthopnea, palpitations, paroxysmal nocturnal dyspnea. Dermatological: No rash, lesions/masses Respiratory: No cough, +++ dyspnea Urologic: No hematuria, dysuria Abdominal:   No nausea, vomiting, diarrhea, bright red blood per rectum, melena, or hematemesis Neurologic:  No visual changes, wkns, changes in mental status. All other systems reviewed and are otherwise negative except as noted above.    Objective  Physical Exam    Blood pressure 122/65, pulse 60, temperature 98 F (36.7 C), resp. rate (!) 24, height 5\' 4"  (1.626 m), weight 108.6 kg, SpO2 94%.  General: Pleasant, NAD Psych: Normal affect. Neuro: Alert and oriented X 3. Moves all extremities spontaneously. HEENT: Normal  Neck: Supple obese, difficult to gauge JVP.  No bruits. Lungs:  Resp regular and unlabored, CTA. Heart: RRR no s3, s4, or murmurs. Abdomen: Obese, soft, non-tender, non-distended, BS + x 4.  Extremities: No clubbing, cyanosis.  Trace bilateral ankle edema. DP/PT2+, Radials 2+ and equal bilaterally.  Labs    Cardiac Enzymes Recent Labs  Lab 10/19/23 0048 10/19/23 0530  TROPONINIHS 17 17     BNP    Component Value Date/Time   BNP 577.9 (H) 10/19/2023 0048    Lab Results  Component Value Date   WBC 9.9 10/19/2023   HGB 11.3 (L) 10/19/2023   HCT 34.6 (L) 10/19/2023   MCV 81.2 10/19/2023   PLT 157 10/19/2023    Recent Labs  Lab 10/19/23 0048 10/19/23 0530 10/20/23 0516  NA 137   < > 140  K 3.6   < > 3.9  CL 102   < > 101  CO2 25   < > 27  BUN 33*   < > 39*  CREATININE 2.42*   < > 2.85*  CALCIUM 9.1   < > 9.1  PROT 6.8  --   --   BILITOT 0.8  --   --   ALKPHOS 84  --   --   ALT 45*  --   --   AST 56*  --   --   GLUCOSE 219*   < > 186*   < > = values in this interval not displayed.   Lab Results  Component Value Date   CHOL 156 11/01/2016   HDL 58 11/01/2016   LDLCALC 79  11/01/2016   TRIG 93 11/01/2016     Lab Results  Component Value Date   INR 3.1 (H) 10/20/2023   INR 2.3 (H) 10/19/2023   INR 2.2 (H) 10/16/2023   PROTIME 33.0 03/15/2016    Radiology Studies    DG Chest Port 1 View  Result Date: 10/19/2023 CLINICAL DATA:  Shortness of breath EXAM: PORTABLE CHEST 1 VIEW COMPARISON:  05/22/2023 FINDINGS: Faint patchy/interstitial opacities in the lungs bilaterally, suggesting mild multifocal infection/pneumonia, lower lobe predominant. No pleural effusions or pneumothorax. Cardiomegaly.  Right subclavian pacemaker. IMPRESSION: Suspected mild multifocal infection/pneumonia, lower lobe predominant. Cardiomegaly. Electronically Signed   By: Charline Bills M.D.   On: 10/19/2023 01:21       ECG & Cardiac  Imaging    AV paced, 64 - personally reviewed.  Assessment & Plan    1.  Acute on chronic HFpEF/pulmonary hypertension: Patient recently diagnosed with atrial flutter and underwent successful cardioversion on November 13.  Unfortunately, she had worsening dyspnea throughout the day on November 14 culminating in orthopnea, and presentation.  Here, BNP was elevated at 577.9.  Troponin is normal.  Chest x-ray with suspected mild multifocal infection/pneumonia predominantly in the lower lobe.  She was treated with antibiotics and also intravenous Lasix.  Antibiotics have since been discontinued.  She is -1.9 L since admission however, BUN and creatinine have increased to 39 and 2.85 this morning.  Lasix currently on hold.  Examination challenging secondary to body habitus however, current weight is lower than historical weights.  She continues to complain of dyspnea on exertion and has continued to require oxygen at 3 L/min.  She has maintained sinus rhythm as evidenced by AV pacing with appropriate rate response noted on telemetry.  Repeat echo has been ordered.  We may need to consider right heart catheterization on Monday (would have to hold warfarin) and if  pressures stable, will need to consider potential role of amiodarone therapy and ongoing dyspnea.  2.  Persistent atrial flutter: Status post cardioversion on the 13th.  Maintaining sinus rhythm-AV paced with seemingly appropriate rate response with ambulation.  She is anticoagulated on warfarin with an INR of 3.1 this morning.  Continue amiodarone and beta-blocker.  3.  Acute on chronic stage IV kidney disease: Worsening creatinine in the setting of diuretic therapy, which is currently on hold.  As above, will need to consider right heart catheterization on Monday to sort out volume status if dyspnea persists.  4.  Primary hypertension: Stable.  5.  Hyperlipidemia: Continue statin.  Risk Assessment/Risk Scores:        New York Heart Association (NYHA) Functional Class NYHA Class III    Signed, Nicolasa Ducking, NP 10/20/2023, 12:34 PM  For questions or updates, please contact   Please consult [email or web address] for contact info under Cardiology/STEMI.

## 2023-10-20 NOTE — Progress Notes (Signed)
PROGRESS NOTE    [name] [name]   BJY:[id number] DOB: [birth year]  DOA: 10/19/2023 Date of Service: 10/20/23 which is hospital day 1  PCP: Dale [place], MD    HPI: [name] ia a 88 y.o. female w/ PMH atrial fibrillation on Coumadin, stage IV chronic kidney disease, GERD, CHF, anxiety, SSS status post pacemaker placement, and type 2 diabetes mellitus who presented 10/19/23 to ED via EMS from home with worsening shortness of breath <1 day and chest pain. Underwent cardioversion 11/13 w/ Dr Mariah Milling for Aflutter associated w/ dyspnea, converted NSR, discharged. Patient was attempting to go to sleep 11/14 and wearing her CPAP when she could not tolerate lying flat to breathe.   Hospital course / significant events:  11/15: to ED, admitted to hospitalist service for SOB w/ nre O2 requirement likely d/t CHF/CAP. Started diuresis and abx. D/c abx, did not feel strong clinical suspicion for CAP. Echo ordered.  11/16: echo pending read, cardiology to see patient today, diuresed well and rales improved, Cr worse will hold diuretics for now pending cardiology eval. Echo pending read.   Consultants:  none   Procedures/Surgeries: none      ASSESSMENT & PLAN:   Acute CHF (congestive heart failure)  Acute respiratory failure d/t pulmonary edema Pulmonary hypertension  suspect diastolic etiology  Family state she has been drinking more water recently at the advice of her doctors to help her kidney function  She has maintained sinus rhythm as evidenced by AV pacing with appropriate rate response noted on telemetry.  Diuresis - caution w/ CKD --> held for now  continue Januvia and Coreg Strict I&O, daily weight, fluid restriction  Echocardiogram pending read  Supplemental O2 as needed Cardiology following may need to consider right heart catheterization on Monday (would have to hold warfarin)  if pressures stable, will need to consider potential role of amiodarone therapy and ongoing  dyspnea.   Persistent atrial flutter Status post cardioversion 11/13. Maintaining sinus rhythm-AV paced with seemingly appropriate rate response with ambulation.  anticoagulated on warfarin with an INR of 3.1. Continue but may need to hold if plan for cardiac cath  Continue amiodarone and beta-blocker.   Multifocal pneumonia ruled out CXR concerning for pneumonia but given no sepsis, no fever, no significant infectious respiratory symptoms such as cough, and w/ reasonable etiology for SOB being dx CHF as above  Will dc abx  Follow blood cultures     AKI on CKD4 Concern for cardiorenal syndrome / diuresis effect  Caution w/ diuresis --> holding for now  OSA CPAP at bedtime   Essential hypertension continue antihypertensive therapy amlodipine, carvedilol    Dyslipidemia Continue home statin therapy.   Atrial fibrillation and flutter (HCC) continue home amiodarone, carvedilol, and Coumadin        obese based on BMI: Body mass index is 41.1 kg/m.  Underweight - under 18.5  normal weight - 18.5 to 24.9 overweight - 25 to 29.9 obese - 30 or more   DVT prophylaxis: coumadin IV fluids: no continuous IV fluids  Nutrition: cardiac/carb Central lines / invasive devices: none  Code Status: FULL CODE ACP documentation reviewed: none on file in [facility]  TOC needs: TBD Barriers to dispo / significant pending items: cardiology eval          Subjective / Brief ROS:  Patient reports still significant SOB on exertion but doing ok at rest and overall better compared to yesterday  Denies CP Pain controlled.  Denies new weakness.  Tolerating  diet.  Reports no concerns w/ urination/defecation.   Family Communication: spoke w niece 10/20/23 1:35 PM     Objective Findings:  Vitals:   10/20/23 0803 10/20/23 0821 10/20/23 0847 10/20/23 1223  BP: 132/60   122/65  Pulse: 61   60  Resp: 20  20 (!) 24  Temp: 99.1 F (37.3 C)   98 F (36.7 C)  TempSrc:      SpO2: 95%  93%  94%  Weight:      Height:        Intake/Output Summary (Last 24 hours) at 10/20/2023 1332 Last data filed at 10/20/2023 1125 Gross per 24 hour  Intake 420 ml  Output 900 ml  Net -480 ml   Filed Weights   10/19/23 0045  Weight: 108.6 kg    Physical Exam Constitutional:      General: She is not in acute distress.    Appearance: She is obese. She is not toxic-appearing.  Cardiovascular:     Rate and Rhythm: Normal rate and regular rhythm.  Pulmonary:     Effort: Pulmonary effort is normal.     Breath sounds: Examination of the right-lower field reveals rales. Examination of the left-lower field reveals rales. Rales present.  Musculoskeletal:     Right lower leg: Edema (trace) present.     Left lower leg: Edema (trace) present.  Skin:    General: Skin is warm and dry.  Neurological:     General: No focal deficit present.     Mental Status: She is alert and oriented to person, place, and time.  Psychiatric:        Mood and Affect: Mood normal.        Behavior: Behavior normal.          Scheduled Medications:   amiodarone  200 mg Oral Daily   amLODipine  5 mg Oral Daily   atorvastatin  20 mg Oral Daily   calcitRIOL  0.25 mcg Oral Daily   carvedilol  25 mg Oral Daily   carvedilol  50 mg Oral QHS   ferrous sulfate  325 mg Oral Daily   guaiFENesin  600 mg Oral BID   insulin aspart  0-5 Units Subcutaneous QHS   insulin aspart  0-9 Units Subcutaneous TID WC   ipratropium-albuterol  3 mL Nebulization TID   linagliptin  5 mg Oral Daily   multivitamin with minerals  1 tablet Oral Daily   pantoprazole  40 mg Oral Daily   potassium chloride  10 mEq Oral Daily   warfarin  1 mg Oral q1800   Warfarin - Physician Dosing Inpatient   Does not apply q1600    Continuous Infusions:   PRN Medications:  acetaminophen **OR** acetaminophen, chlorpheniramine-HYDROcodone, Glycerin (Adult), ipratropium-albuterol, magnesium hydroxide, meclizine, ondansetron **OR** ondansetron  (ZOFRAN) IV, polyethylene glycol, traZODone  Antimicrobials from admission:  Anti-infectives (From admission, onward)    Start     Dose/Rate Route Frequency Ordered Stop   10/20/23 0000  cefTRIAXone (ROCEPHIN) 2 g in sodium chloride 0.9 % 100 mL IVPB  Status:  Discontinued        2 g 200 mL/hr over 30 Minutes Intravenous Every 24 hours 10/19/23 0404 10/19/23 1525   10/20/23 0000  azithromycin (ZITHROMAX) 500 mg in dextrose 5 % 250 mL IVPB  Status:  Discontinued        500 mg 250 mL/hr over 60 Minutes Intravenous Every 24 hours 10/19/23 0404 10/19/23 1525   10/19/23 0445  cefTRIAXone (ROCEPHIN) 1 g  in sodium chloride 0.9 % 100 mL IVPB        1 g 200 mL/hr over 30 Minutes Intravenous  Once 10/19/23 0441 10/19/23 0606   10/19/23 0200  cefTRIAXone (ROCEPHIN) 1 g in sodium chloride 0.9 % 100 mL IVPB        1 g 200 mL/hr over 30 Minutes Intravenous  Once 10/19/23 0147 10/19/23 0253   10/19/23 0200  azithromycin (ZITHROMAX) 500 mg in dextrose 5 % 250 mL IVPB        500 mg 250 mL/hr over 60 Minutes Intravenous  Once 10/19/23 0147 10/19/23 0335           Data Reviewed:  I have personally reviewed the following...  CBC: Recent Labs  Lab 10/16/23 1059 10/19/23 0048 10/19/23 0530  WBC 5.4 7.4 9.9  NEUTROABS  --  5.4  --   HGB 11.0* 11.4* 11.3*  HCT 32.6* 33.7* 34.6*  MCV 80.1 79.7* 81.2  PLT 138* 140* 157   Basic Metabolic Panel: Recent Labs  Lab 10/16/23 1059 10/19/23 0048 10/19/23 0530 10/20/23 0516  NA 138 137 138 140  K 3.8 3.6 4.1 3.9  CL 103 102 101 101  CO2 27 25 26 27   GLUCOSE 151* 219* 377* 186*  BUN 25* 33* 34* 39*  CREATININE 2.63* 2.42* 2.59* 2.85*  CALCIUM 9.2 9.1 9.1 9.1   GFR: Estimated Creatinine Clearance: 17.4 mL/min (A) (by C-G formula based on SCr of 2.85 mg/dL (H)). Liver Function Tests: Recent Labs  Lab 10/19/23 0048  AST 56*  ALT 45*  ALKPHOS 84  BILITOT 0.8  PROT 6.8  ALBUMIN 3.8   No results for input(s): "LIPASE", "AMYLASE" in  the last 168 hours. No results for input(s): "AMMONIA" in the last 168 hours. Coagulation Profile: Recent Labs  Lab 10/16/23 1059 10/19/23 0223 10/20/23 0516  INR 2.2* 2.3* 3.1*   Cardiac Enzymes: No results for input(s): "CKTOTAL", "CKMB", "CKMBINDEX", "TROPONINI" in the last 168 hours. BNP (last 3 results) No results for input(s): "PROBNP" in the last 8760 hours. HbA1C: No results for input(s): "HGBA1C" in the last 72 hours. CBG: Recent Labs  Lab 10/19/23 1156 10/19/23 1634 10/19/23 2144 10/20/23 0800 10/20/23 1219  GLUCAP 371* 168* 100* 218* 197*   Lipid Profile: No results for input(s): "CHOL", "HDL", "LDLCALC", "TRIG", "CHOLHDL", "LDLDIRECT" in the last 72 hours. Thyroid Function Tests: No results for input(s): "TSH", "T4TOTAL", "FREET4", "T3FREE", "THYROIDAB" in the last 72 hours. Anemia Panel: No results for input(s): "VITAMINB12", "FOLATE", "FERRITIN", "TIBC", "IRON", "RETICCTPCT" in the last 72 hours. Most Recent Urinalysis On File:     Component Value Date/Time   COLORURINE STRAW (A) 12/01/2018 0828   APPEARANCEUR CLEAR (A) 12/01/2018 0828   APPEARANCEUR Clear 08/03/2014 1622   LABSPEC 1.006 12/01/2018 0828   LABSPEC 1.004 08/03/2014 1622   PHURINE 7.0 12/01/2018 0828   GLUCOSEU NEGATIVE 12/01/2018 0828   GLUCOSEU Negative 08/03/2014 1622   HGBUR NEGATIVE 12/01/2018 0828   BILIRUBINUR NEGATIVE 12/01/2018 0828   BILIRUBINUR Negative 08/03/2014 1622   KETONESUR NEGATIVE 12/01/2018 0828   PROTEINUR NEGATIVE 12/01/2018 0828   NITRITE NEGATIVE 12/01/2018 0828   LEUKOCYTESUR NEGATIVE 12/01/2018 0828   LEUKOCYTESUR Negative 08/03/2014 1622   Sepsis Labs: @LABRCNTIP (procalcitonin:4,lacticidven:4) Microbiology: Recent Results (from the past 240 hour(s))  Resp panel by RT-PCR (RSV, Flu A&B, Covid) Anterior Nasal Swab     Status: None   Collection Time: 10/19/23 12:59 AM   Specimen: Anterior Nasal Swab  Result Value Ref Range Status  SARS Coronavirus 2 by  RT PCR NEGATIVE NEGATIVE Final    Comment: (NOTE) SARS-CoV-2 target nucleic acids are NOT DETECTED.  The SARS-CoV-2 RNA is generally detectable in upper respiratory specimens during the acute phase of infection. The lowest concentration of SARS-CoV-2 viral copies this assay can detect is 138 copies/mL. A negative result does not preclude SARS-Cov-2 infection and should not be used as the sole basis for treatment or other patient management decisions. A negative result may occur with  improper specimen collection/handling, submission of specimen other than nasopharyngeal swab, presence of viral mutation(s) within the areas targeted by this assay, and inadequate number of viral copies(<138 copies/mL). A negative result must be combined with clinical observations, patient history, and epidemiological information. The expected result is Negative.  Fact Sheet for Patients:  BloggerCourse.com  Fact Sheet for Healthcare Providers:  SeriousBroker.it  This test is no t yet approved or cleared by the [place] FDA and  has been authorized for detection and/or diagnosis of SARS-CoV-2 by FDA under an Emergency Use Authorization (EUA). This EUA will remain  in effect (meaning this test can be used) for the duration of the COVID-19 declaration under Section 564(b)(1) of the Act, 21 U.S.C.section 360bbb-3(b)(1), unless the authorization is terminated  or revoked sooner.       Influenza A by PCR NEGATIVE NEGATIVE Final   Influenza B by PCR NEGATIVE NEGATIVE Final    Comment: (NOTE) The Xpert Xpress SARS-CoV-2/FLU/RSV plus assay is intended as an aid in the diagnosis of influenza from Nasopharyngeal swab specimens and should not be used as a sole basis for treatment. Nasal washings and aspirates are unacceptable for Xpert Xpress SARS-CoV-2/FLU/RSV testing.  Fact Sheet for Patients: BloggerCourse.com  Fact Sheet  for Healthcare Providers: SeriousBroker.it  This test is not yet approved or cleared by the [place] FDA and has been authorized for detection and/or diagnosis of SARS-CoV-2 by FDA under an Emergency Use Authorization (EUA). This EUA will remain in effect (meaning this test can be used) for the duration of the COVID-19 declaration under Section 564(b)(1) of the Act, 21 U.S.C. section 360bbb-3(b)(1), unless the authorization is terminated or revoked.     Resp Syncytial Virus by PCR NEGATIVE NEGATIVE Final    Comment: (NOTE) Fact Sheet for Patients: BloggerCourse.com  Fact Sheet for Healthcare Providers: SeriousBroker.it  This test is not yet approved or cleared by the [place] FDA and has been authorized for detection and/or diagnosis of SARS-CoV-2 by FDA under an Emergency Use Authorization (EUA). This EUA will remain in effect (meaning this test can be used) for the duration of the COVID-19 declaration under Section 564(b)(1) of the Act, 21 U.S.C. section 360bbb-3(b)(1), unless the authorization is terminated or revoked.  Performed at [facility], [address] [place], [place] [address]   Culture, blood (routine x 2)     Status: None (Preliminary result)   Collection Time: 10/19/23  2:23 AM   Specimen: BLOOD  Result Value Ref Range Status   Specimen Description BLOOD BLOOD RIGHT ARM  Final   Special Requests   Final    BOTTLES DRAWN AEROBIC AND ANAEROBIC Blood Culture adequate volume   Culture   Final    NO GROWTH 1 DAY Performed at [facility], [address] [place], [place] [address]    Report Status PENDING  Incomplete  Culture, blood (routine x 2)     Status: None (Preliminary result)   Collection Time: 10/19/23  2:36 AM   Specimen: BLOOD  Result Value Ref Range Status   Specimen Description BLOOD BLOOD LEFT ARM  Final   Special Requests   Final     BOTTLES DRAWN AEROBIC AND ANAEROBIC Blood Culture adequate volume   Culture   Final    NO GROWTH 1 DAY Performed at [facility], [address], [place], [place] [address]    Report Status PENDING  Incomplete      Radiology Studies last 3 days: DG Chest Port 1 View  Result Date: 10/19/2023 CLINICAL DATA:  Shortness of breath EXAM: PORTABLE CHEST 1 VIEW COMPARISON:  05/22/2023 FINDINGS: Faint patchy/interstitial opacities in the lungs bilaterally, suggesting mild multifocal infection/pneumonia, lower lobe predominant. No pleural effusions or pneumothorax. Cardiomegaly.  Right subclavian pacemaker. IMPRESSION: Suspected mild multifocal infection/pneumonia, lower lobe predominant. Cardiomegaly. Electronically Signed   By: Charline Bills M.D.   On: 10/19/2023 01:21       Time spent: 50 min     Sunnie Nielsen, DO [facility] 10/20/2023, 1:32 PM    Dictation software may have been used to generate the above note. Typos may occur and escape review in typed/dictated notes. Please contact Dr Lyn Hollingshead directly for clarity if needed.  Staff may message me via secure chat in Epic  but this may not receive an immediate response,  please page me for urgent matters!  If 7PM-7AM, please contact night coverage [email or web address]

## 2023-10-21 DIAGNOSIS — Z789 Other specified health status: Secondary | ICD-10-CM

## 2023-10-21 DIAGNOSIS — N184 Chronic kidney disease, stage 4 (severe): Secondary | ICD-10-CM | POA: Diagnosis not present

## 2023-10-21 DIAGNOSIS — I509 Heart failure, unspecified: Secondary | ICD-10-CM | POA: Diagnosis not present

## 2023-10-21 DIAGNOSIS — I5033 Acute on chronic diastolic (congestive) heart failure: Secondary | ICD-10-CM | POA: Diagnosis not present

## 2023-10-21 DIAGNOSIS — I4892 Unspecified atrial flutter: Secondary | ICD-10-CM | POA: Diagnosis not present

## 2023-10-21 LAB — BASIC METABOLIC PANEL
Anion gap: 11 (ref 5–15)
BUN: 46 mg/dL — ABNORMAL HIGH (ref 8–23)
CO2: 25 mmol/L (ref 22–32)
Calcium: 9 mg/dL (ref 8.9–10.3)
Chloride: 101 mmol/L (ref 98–111)
Creatinine, Ser: 2.97 mg/dL — ABNORMAL HIGH (ref 0.44–1.00)
GFR, Estimated: 15 mL/min — ABNORMAL LOW (ref >60)
Glucose, Bld: 160 mg/dL — ABNORMAL HIGH (ref 70–99)
Potassium: 3.6 mmol/L (ref 3.5–5.1)
Sodium: 137 mmol/L (ref 135–145)

## 2023-10-21 LAB — GLUCOSE, CAPILLARY
Glucose-Capillary: 163 mg/dL — ABNORMAL HIGH (ref 70–99)
Glucose-Capillary: 220 mg/dL — ABNORMAL HIGH (ref 70–99)
Glucose-Capillary: 154 mg/dL — ABNORMAL HIGH (ref 70–99)
Glucose-Capillary: 210 mg/dL — ABNORMAL HIGH (ref 70–99)

## 2023-10-21 LAB — PROTIME-INR
INR: 3.3 — ABNORMAL HIGH (ref 0.8–1.2)
Prothrombin Time: 33.9 s — ABNORMAL HIGH (ref 11.4–15.2)

## 2023-10-21 NOTE — Plan of Care (Signed)

## 2023-10-21 NOTE — Progress Notes (Signed)
MD wanted to make sure of CPAP at bedtime, respiratory team was contacted, made aware of MD's concern, respiratory said the patient refused the hospital CPAP and said someone is going to bring her home CPAP.

## 2023-10-21 NOTE — Consult Note (Signed)
Pharmacy Consult Note - Anticoagulation  Pharmacy Consult for warfarin (continuation of home therapy) Indication: Atrial fibrillation  PATIENT MEASUREMENTS: Height: 5\' 4"  (162.6 cm) Weight: 110.5 kg (243 lb 8 oz) IBW/kg (Calculated) : 54.7 HEPARIN DW (KG): 80.4  VITAL SIGNS: Temp: 98.6 F (37 C) (11/17 0755) Temp Source: Oral (11/17 0755) BP: 130/72 (11/17 0755) Pulse Rate: 68 (11/17 0755)  Recent Labs    10/19/23 0530 10/20/23 0516 10/21/23 0330  HGB 11.3*  --   --   HCT 34.6*  --   --   PLT 157  --   --   LABPROT  --    < > 33.9*  INR  --    < > 3.3*  CREATININE 2.59*   < > 2.97*  TROPONINIHS 17  --   --    < > = values in this interval not displayed.    Estimated Creatinine Clearance: 16.8 mL/min (A) (by C-G formula based on SCr of 2.97 mg/dL (H)).  PAST MEDICAL HISTORY: Past Medical History:  Diagnosis Date   1st degree AV block    a. PR 400 msec with Apacing   A-fib (HCC)    Hx of   Anemia    Anxiety    Arthritis    knees and lower back    Chest pain    a. 2004 Cath:  reportedly nl;  b. 09/2013 Neg MV; c. 11/2017 nl ETT; d. 08/2023 MV: No isch/infarct. nl LV fxn. Mild Ao Ca2+.   Chronic diastolic heart failure (HCC)    a. 09/2013 Echo: EF 50-55%, mild LVH, mild to mod MR/TR; b. 04/2023 Echo: EF 55-60%, no rwma, nl RV size/fxn, RVSP 44.65mmHg. Mod dil LA. Mod MR/TR.   CKD (chronic kidney disease), stage IV (HCC)    Colon polyps    Congestive heart disease (HCC)    Esophageal reflux    Glaucoma    Gout    H/O hiatal hernia    History of UTI    Hyperlipidemia    Moderate mitral regurgitation    a. 04/2023 Echo: Mod MR.   Morbid obesity (HCC)    OSA (obstructive sleep apnea)    OSA on CPAP    CPAP   Pacemaker    copy of medtronic card in chart   PAF (paroxysmal atrial fibrillation) (HCC)    a. 10/2023 s/p DCCV (150J); b. CHA2DS2VASc = 5-->warfarin.   Primary hypertension    controlled on meds   Pulmonary hypertension (HCC)    a. 04/2023 Echo: RVSP  44.23mmHg.   Seasonal allergies    SSS (sick sinus syndrome) (HCC)    a. 2008 s/p MDT PPM;  b. 11/2013 Gen change- MDT ADDRL1 Adapta DC PPM, ser # [id number] H.   Symptomatic PVCs    Syncope and collapse    a. Felt to be vasovagal.   Thyroid disease    nodules on thyroid, with goiter, on no meds per pt   Tricuspid regurgitation    Type II diabetes mellitus (HCC)    Type 2   Unspecified glaucoma(365.9)    Vertigo    in past   Wears dentures    upper full and lower partial    ASSESSMENT: 88 y.o. female with PMH including Afib is presenting with pneumonia. Patient takes warfarin 2 mg PO daily, with her most recent dose prior to admission being on 11/14. INR today is therapeutic.  Pertinent medications: Warfarin 2 mg PO daily Last dose before admission: 11/14 Total weekly warfarin dose: 14  mg INR goal 2-3  Drug-Drug Interactions: Amiodarone(on both PTA), azithromycin x1 11/15  Goal(s) of therapy: INR 2 - 3    Baseline anticoagulation labs: Recent Labs    10/19/23 0048 10/19/23 0223 10/19/23 0530 10/20/23 0516 10/21/23 0330  INR  --  2.3*  --  3.1* 3.3*  HGB 11.4*  --  11.3*  --   --   PLT 140*  --  157  --   --      Date INR Dose  11/15 2.3 2 mg  11/16 3.1   11/17 3.3           PLAN: INR 3.3   Cardiology is now transitioning to Heparin drip (to start when INR < 2.0) for possible cardiac cath (?Monday 11/18) F/u INR in am Continue to monitor for drug-drug interactions between warfarin and any new-start medications   Bari Mantis PharmD Clinical Pharmacist 10/21/2023

## 2023-10-21 NOTE — Plan of Care (Signed)
  Problem: Education: Goal: Knowledge of General Education information will improve Description: Including pain rating scale, medication(s)/side effects and non-pharmacologic comfort measures Outcome: Progressing   Problem: Health Behavior/Discharge Planning: Goal: Ability to manage health-related needs will improve Outcome: Progressing   Problem: Clinical Measurements: Goal: Ability to maintain clinical measurements within normal limits will improve Outcome: Progressing Goal: Will remain free from infection Outcome: Progressing Goal: Diagnostic test results will improve Outcome: Progressing Goal: Respiratory complications will improve Outcome: Progressing Goal: Cardiovascular complication will be avoided Outcome: Progressing   Problem: Activity: Goal: Risk for activity intolerance will decrease Outcome: Progressing   Problem: Nutrition: Goal: Adequate nutrition will be maintained Outcome: Progressing   Problem: Coping: Goal: Level of anxiety will decrease Outcome: Progressing   Problem: Elimination: Goal: Will not experience complications related to bowel motility Outcome: Progressing Goal: Will not experience complications related to urinary retention Outcome: Progressing   Problem: Pain Management: Goal: General experience of comfort will improve Outcome: Progressing   Problem: Safety: Goal: Ability to remain free from injury will improve Outcome: Progressing   Problem: Skin Integrity: Goal: Risk for impaired skin integrity will decrease Outcome: Progressing   Problem: Education: Goal: Ability to describe self-care measures that may prevent or decrease complications (Diabetes Survival Skills Education) will improve Outcome: Progressing Goal: Individualized Educational Video(s) Outcome: Progressing   Problem: Coping: Goal: Ability to adjust to condition or change in health will improve Outcome: Progressing   Problem: Fluid Volume: Goal: Ability to  maintain a balanced intake and output will improve Outcome: Progressing   Problem: Health Behavior/Discharge Planning: Goal: Ability to identify and utilize available resources and services will improve Outcome: Progressing Goal: Ability to manage health-related needs will improve Outcome: Progressing   Problem: Metabolic: Goal: Ability to maintain appropriate glucose levels will improve Outcome: Progressing   Problem: Nutritional: Goal: Maintenance of adequate nutrition will improve Outcome: Progressing Goal: Progress toward achieving an optimal weight will improve Outcome: Progressing   Problem: Skin Integrity: Goal: Risk for impaired skin integrity will decrease Outcome: Progressing   Problem: Tissue Perfusion: Goal: Adequacy of tissue perfusion will improve Outcome: Progressing   Problem: Education: Goal: Ability to demonstrate management of disease process will improve Outcome: Progressing Goal: Ability to verbalize understanding of medication therapies will improve Outcome: Progressing Goal: Individualized Educational Video(s) Outcome: Progressing   Problem: Activity: Goal: Capacity to carry out activities will improve Outcome: Progressing   Problem: Cardiac: Goal: Ability to achieve and maintain adequate cardiopulmonary perfusion will improve Outcome: Progressing

## 2023-10-21 NOTE — Plan of Care (Signed)
  Problem: Education: Goal: Knowledge of General Education information will improve Description: Including pain rating scale, medication(s)/side effects and non-pharmacologic comfort measures Outcome: Progressing   Problem: Health Behavior/Discharge Planning: Goal: Ability to manage health-related needs will improve Outcome: Progressing   Problem: Clinical Measurements: Goal: Ability to maintain clinical measurements within normal limits will improve Outcome: Progressing   Problem: Education: Goal: Ability to demonstrate management of disease process will improve Outcome: Progressing Goal: Ability to verbalize understanding of medication therapies will improve Outcome: Progressing

## 2023-10-21 NOTE — Progress Notes (Signed)
PROGRESS NOTE    CHAIA MANKA   ZOX:[id number] DOB: 1935-05-13  DOA: 10/19/2023 Date of Service: 10/21/23 which is hospital day 2  PCP: Dale [place], MD    HPI: [name] ia a 88 y.o. female w/ PMH atrial fibrillation on Coumadin, stage IV chronic kidney disease, GERD, CHF, anxiety, SSS status post pacemaker placement, and type 2 diabetes mellitus who presented 10/19/23 to ED via EMS from home with worsening shortness of breath <1 day and chest pain. Underwent cardioversion 11/13 w/ Dr Mariah Milling for Aflutter associated w/ dyspnea, converted NSR, discharged. Patient was attempting to go to sleep 11/14 and wearing her CPAP when she could not tolerate lying flat to breathe.   Hospital course / significant events:  11/15: to ED, admitted to hospitalist service for SOB w/ new O2 requirement likely d/t CHF/CAP. Started diuresis and abx. D/c abx, did not feel strong clinical suspicion for CAP. Echo ordered.  11/16: Cardiology to see patient today, diuresed well and rales improved, Cr worse will hold diuretics for now pending cardiology eval. Echo EF 55-60, normal LV fxn and no RWMA, indeterminate diastolic, severely elevated pulmonary artery pressure,  11/17: back into aflutter overnight. Cardiology planning possible EP eval / HF team eval. Still significant DOE.   Consultants:  none   Procedures/Surgeries: none      ASSESSMENT & PLAN:   Dyspnea d/t Acute CHF (congestive heart failure) HFpEF, pulmonary edema, Pulmonary hypertension - severe  Acute hypoxic respiratory failure d/t above  Family state she has been drinking more water recently at the advice of her doctors to help her kidney function  Diuresis - caution w/ CKD --> held for now w/ AKI continue Januvia and Coreg Strict I&O, daily weight, fluid restriction  Supplemental O2 as needed - she uses CPAP at hoem but not on chronic O2  Cardiology following may need to consider right heart catheterization on Monday (would have to  hold warfarin)  if pressures stable, consider potential role of amiodarone therapy and ongoing dyspnea.  Cardiology team to consider EP/CHF teams to eval   Persistent atrial flutter Status post cardioversion 11/13. Maintaining sinus rhythm-AV paced with seemingly appropriate rate response with ambulation however back into Aflutter overnight 11/17 anticoagulated on warfarin  Continue amiodarone and beta-blocker.   Multifocal pneumonia ruled out CXR concerning for pneumonia but given no sepsis, no fever, no significant infectious respiratory symptoms such as cough, and w/ reasonable etiology for SOB being dx CHF as above  dc abx  Follow blood cultures --> NG x2d    AKI on CKD4 Concern for cardiorenal syndrome / diuresis effect  Caution w/ diuresis --> holding for now  OSA CPAP at bedtime   Essential hypertension continue antihypertensive therapy amlodipine, carvedilol  Cardiology following    Dyslipidemia Continue home statin therapy.        obese based on BMI: Body mass index is 41.1 kg/m.  Underweight - under 18.5  normal weight - 18.5 to 24.9 overweight - 25 to 29.9 obese - 30 or more   DVT prophylaxis: coumadin IV fluids: no continuous IV fluids  Nutrition: cardiac/carb Central lines / invasive devices: none  Code Status: FULL CODE ACP documentation reviewed: none on file in [facility]  TOC needs: TBD Barriers to dispo / significant pending items: cardiology workup          Subjective / Brief ROS:  Patient reports still significant SOB on exertion but doing ok at rest\ Denies CP Pain controlled.  Denies new weakness.  Tolerating diet.  Reports no concerns w/ urination/defecation.   Family Communication: family at bedside on rounds      Objective Findings:  Vitals:   10/21/23 0500 10/21/23 0726 10/21/23 0755 10/21/23 1144  BP: 123/69  130/72 (!) 110/57  Pulse: 67  68 60  Resp: 18 18 18 16   Temp: 98.2 F (36.8 C)  98.6 F (37 C) 98.1 F  (36.7 C)  TempSrc: Oral  Oral Oral  SpO2: 93% 94% 95% 94%  Weight:   110.5 kg   Height:       No intake or output data in the 24 hours ending 10/21/23 1205  Filed Weights   10/19/23 0045 10/21/23 0755  Weight: 108.6 kg 110.5 kg    Physical Exam Constitutional:      General: She is not in acute distress.    Appearance: She is obese. She is not toxic-appearing.  Cardiovascular:     Rate and Rhythm: Normal rate and regular rhythm.  Pulmonary:     Effort: Pulmonary effort is normal.     Breath sounds: Examination of the right-lower field reveals rales. Examination of the left-lower field reveals rales. Rales present.  Musculoskeletal:     Right lower leg: Edema (trace) present.     Left lower leg: Edema (trace) present.  Skin:    General: Skin is warm and dry.  Neurological:     General: No focal deficit present.     Mental Status: She is alert and oriented to person, place, and time.  Psychiatric:        Mood and Affect: Mood normal.        Behavior: Behavior normal.          Scheduled Medications:   amiodarone  200 mg Oral Daily   amLODipine  5 mg Oral Daily   atorvastatin  20 mg Oral Daily   calcitRIOL  0.25 mcg Oral Daily   carvedilol  25 mg Oral Daily   carvedilol  50 mg Oral QHS   ferrous sulfate  325 mg Oral Daily   guaiFENesin  600 mg Oral BID   insulin aspart  0-5 Units Subcutaneous QHS   insulin aspart  0-9 Units Subcutaneous TID WC   linagliptin  5 mg Oral Daily   multivitamin with minerals  1 tablet Oral Daily   pantoprazole  40 mg Oral Daily   potassium chloride  10 mEq Oral Daily    Continuous Infusions:   PRN Medications:  acetaminophen **OR** acetaminophen, chlorpheniramine-HYDROcodone, Glycerin (Adult), ipratropium-albuterol, magnesium hydroxide, meclizine, ondansetron **OR** ondansetron (ZOFRAN) IV, polyethylene glycol, traZODone  Antimicrobials from admission:  Anti-infectives (From admission, onward)    Start     Dose/Rate Route  Frequency Ordered Stop   10/20/23 0000  cefTRIAXone (ROCEPHIN) 2 g in sodium chloride 0.9 % 100 mL IVPB  Status:  Discontinued        2 g 200 mL/hr over 30 Minutes Intravenous Every 24 hours 10/19/23 0404 10/19/23 1525   10/20/23 0000  azithromycin (ZITHROMAX) 500 mg in dextrose 5 % 250 mL IVPB  Status:  Discontinued        500 mg 250 mL/hr over 60 Minutes Intravenous Every 24 hours 10/19/23 0404 10/19/23 1525   10/19/23 0445  cefTRIAXone (ROCEPHIN) 1 g in sodium chloride 0.9 % 100 mL IVPB        1 g 200 mL/hr over 30 Minutes Intravenous  Once 10/19/23 0441 10/19/23 0606   10/19/23 0200  cefTRIAXone (ROCEPHIN) 1 g in sodium  chloride 0.9 % 100 mL IVPB        1 g 200 mL/hr over 30 Minutes Intravenous  Once 10/19/23 0147 10/19/23 0253   10/19/23 0200  azithromycin (ZITHROMAX) 500 mg in dextrose 5 % 250 mL IVPB        500 mg 250 mL/hr over 60 Minutes Intravenous  Once 10/19/23 0147 10/19/23 0335           Data Reviewed:  I have personally reviewed the following...  CBC: Recent Labs  Lab 10/16/23 1059 10/19/23 0048 10/19/23 0530  WBC 5.4 7.4 9.9  NEUTROABS  --  5.4  --   HGB 11.0* 11.4* 11.3*  HCT 32.6* 33.7* 34.6*  MCV 80.1 79.7* 81.2  PLT 138* 140* 157   Basic Metabolic Panel: Recent Labs  Lab 10/16/23 1059 10/19/23 0048 10/19/23 0530 10/20/23 0516 10/21/23 0330  NA 138 137 138 140 137  K 3.8 3.6 4.1 3.9 3.6  CL 103 102 101 101 101  CO2 27 25 26 27 25   GLUCOSE 151* 219* 377* 186* 160*  BUN 25* 33* 34* 39* 46*  CREATININE 2.63* 2.42* 2.59* 2.85* 2.97*  CALCIUM 9.2 9.1 9.1 9.1 9.0   GFR: Estimated Creatinine Clearance: 16.8 mL/min (A) (by C-G formula based on SCr of 2.97 mg/dL (H)). Liver Function Tests: Recent Labs  Lab 10/19/23 0048  AST 56*  ALT 45*  ALKPHOS 84  BILITOT 0.8  PROT 6.8  ALBUMIN 3.8   No results for input(s): "LIPASE", "AMYLASE" in the last 168 hours. No results for input(s): "AMMONIA" in the last 168 hours. Coagulation  Profile: Recent Labs  Lab 10/16/23 1059 10/19/23 0223 10/20/23 0516 10/21/23 0330  INR 2.2* 2.3* 3.1* 3.3*   Cardiac Enzymes: No results for input(s): "CKTOTAL", "CKMB", "CKMBINDEX", "TROPONINI" in the last 168 hours. BNP (last 3 results) No results for input(s): "PROBNP" in the last 8760 hours. HbA1C: No results for input(s): "HGBA1C" in the last 72 hours. CBG: Recent Labs  Lab 10/20/23 1219 10/20/23 1600 10/20/23 2106 10/21/23 0806 10/21/23 1141  GLUCAP 197* 138* 160* 163* 220*   Lipid Profile: No results for input(s): "CHOL", "HDL", "LDLCALC", "TRIG", "CHOLHDL", "LDLDIRECT" in the last 72 hours. Thyroid Function Tests: No results for input(s): "TSH", "T4TOTAL", "FREET4", "T3FREE", "THYROIDAB" in the last 72 hours. Anemia Panel: No results for input(s): "VITAMINB12", "FOLATE", "FERRITIN", "TIBC", "IRON", "RETICCTPCT" in the last 72 hours. Most Recent Urinalysis On File:     Component Value Date/Time   COLORURINE STRAW (A) 12/01/2018 0828   APPEARANCEUR CLEAR (A) 12/01/2018 0828   APPEARANCEUR Clear 08/03/2014 1622   LABSPEC 1.006 12/01/2018 0828   LABSPEC 1.004 08/03/2014 1622   PHURINE 7.0 12/01/2018 0828   GLUCOSEU NEGATIVE 12/01/2018 0828   GLUCOSEU Negative 08/03/2014 1622   HGBUR NEGATIVE 12/01/2018 0828   BILIRUBINUR NEGATIVE 12/01/2018 0828   BILIRUBINUR Negative 08/03/2014 1622   KETONESUR NEGATIVE 12/01/2018 0828   PROTEINUR NEGATIVE 12/01/2018 0828   NITRITE NEGATIVE 12/01/2018 0828   LEUKOCYTESUR NEGATIVE 12/01/2018 0828   LEUKOCYTESUR Negative 08/03/2014 1622   Sepsis Labs: @LABRCNTIP (procalcitonin:4,lacticidven:4) Microbiology: Recent Results (from the past 240 hour(s))  Resp panel by RT-PCR (RSV, Flu A&B, Covid) Anterior Nasal Swab     Status: None   Collection Time: 10/19/23 12:59 AM   Specimen: Anterior Nasal Swab  Result Value Ref Range Status   SARS Coronavirus 2 by RT PCR NEGATIVE NEGATIVE Final    Comment: (NOTE) SARS-CoV-2 target  nucleic acids are NOT DETECTED.  The SARS-CoV-2 RNA is  generally detectable in upper respiratory specimens during the acute phase of infection. The lowest concentration of SARS-CoV-2 viral copies this assay can detect is 138 copies/mL. A negative result does not preclude SARS-Cov-2 infection and should not be used as the sole basis for treatment or other patient management decisions. A negative result may occur with  improper specimen collection/handling, submission of specimen other than nasopharyngeal swab, presence of viral mutation(s) within the areas targeted by this assay, and inadequate number of viral copies(<138 copies/mL). A negative result must be combined with clinical observations, patient history, and epidemiological information. The expected result is Negative.  Fact Sheet for Patients:  BloggerCourse.com  Fact Sheet for Healthcare Providers:  SeriousBroker.it  This test is no t yet approved or cleared by the [place] FDA and  has been authorized for detection and/or diagnosis of SARS-CoV-2 by FDA under an Emergency Use Authorization (EUA). This EUA will remain  in effect (meaning this test can be used) for the duration of the COVID-19 declaration under Section 564(b)(1) of the Act, 21 U.S.C.section 360bbb-3(b)(1), unless the authorization is terminated  or revoked sooner.       Influenza A by PCR NEGATIVE NEGATIVE Final   Influenza B by PCR NEGATIVE NEGATIVE Final    Comment: (NOTE) The Xpert Xpress SARS-CoV-2/FLU/RSV plus assay is intended as an aid in the diagnosis of influenza from Nasopharyngeal swab specimens and should not be used as a sole basis for treatment. Nasal washings and aspirates are unacceptable for Xpert Xpress SARS-CoV-2/FLU/RSV testing.  Fact Sheet for Patients: BloggerCourse.com  Fact Sheet for Healthcare  Providers: SeriousBroker.it  This test is not yet approved or cleared by the [place] FDA and has been authorized for detection and/or diagnosis of SARS-CoV-2 by FDA under an Emergency Use Authorization (EUA). This EUA will remain in effect (meaning this test can be used) for the duration of the COVID-19 declaration under Section 564(b)(1) of the Act, 21 U.S.C. section 360bbb-3(b)(1), unless the authorization is terminated or revoked.     Resp Syncytial Virus by PCR NEGATIVE NEGATIVE Final    Comment: (NOTE) Fact Sheet for Patients: BloggerCourse.com  Fact Sheet for Healthcare Providers: SeriousBroker.it  This test is not yet approved or cleared by the [place] FDA and has been authorized for detection and/or diagnosis of SARS-CoV-2 by FDA under an Emergency Use Authorization (EUA). This EUA will remain in effect (meaning this test can be used) for the duration of the COVID-19 declaration under Section 564(b)(1) of the Act, 21 U.S.C. section 360bbb-3(b)(1), unless the authorization is terminated or revoked.  Performed at [facility], [address] [place], [place] [address]   Culture, blood (routine x 2)     Status: None (Preliminary result)   Collection Time: 10/19/23  2:23 AM   Specimen: BLOOD  Result Value Ref Range Status   Specimen Description BLOOD BLOOD RIGHT ARM  Final   Special Requests   Final    BOTTLES DRAWN AEROBIC AND ANAEROBIC Blood Culture adequate volume   Culture   Final    NO GROWTH 2 DAYS Performed at [facility], [address] [place], [place] [address]    Report Status PENDING  Incomplete  Culture, blood (routine x 2)     Status: None (Preliminary result)   Collection Time: 10/19/23  2:36 AM   Specimen: BLOOD  Result Value Ref Range Status   Specimen Description BLOOD BLOOD LEFT ARM  Final   Special Requests   Final    BOTTLES  DRAWN  AEROBIC AND ANAEROBIC Blood Culture adequate volume   Culture   Final    NO GROWTH 2 DAYS Performed at [facility], [address] [place], [place] [address]    Report Status PENDING  Incomplete      Radiology Studies last 3 days: ECHOCARDIOGRAM COMPLETE  Result Date: 10/20/2023    ECHOCARDIOGRAM REPORT   Patient Name:   DENASIA WHISLER Date of Exam: 10/20/2023 Medical Rec #:  [id number]    Height:       64.0 in Accession #:    [id number]   Weight:       239.4 lb Date of Birth:  Jun 20, 1935   BSA:          2.112 m Patient Age:    87 years     BP:           130/64 mmHg Patient Gender: F            HR:           60 bpm. Exam Location:  [facility] Procedure: 2D Echo Indications:     CHF I50.31  History:         Patient has prior history of Echocardiogram examinations, most                  recent 04/25/2023.  Sonographer:     Overton Mam RDCS, FASE Referring Phys:  [id number] Sunnie Nielsen Diagnosing Phys: [name] MD IMPRESSIONS  1. Left ventricular ejection fraction, by estimation, is 55 to 60%. The left ventricle has normal function. The left ventricle has no regional wall motion abnormalities. Left ventricular diastolic parameters are indeterminate.  2. Right ventricular systolic function is low normal. The right ventricular size is mildly enlarged. There is severely elevated pulmonary artery systolic pressure. The estimated right ventricular systolic pressure is 62.8 mmHg.  3. Left atrial size was mildly dilated.  4. The mitral valve is normal in structure. No evidence of mitral valve regurgitation. No evidence of mitral stenosis.  5. Tricuspid valve regurgitation is moderate.  6. The aortic valve is normal in structure. Aortic valve regurgitation is not visualized. No aortic stenosis is present.  7. The inferior vena cava is normal in size with greater than 50% respiratory variability, suggesting right atrial pressure of 3 mmHg. FINDINGS  Left Ventricle: Left ventricular ejection  fraction, by estimation, is 55 to 60%. The left ventricle has normal function. The left ventricle has no regional wall motion abnormalities. The left ventricular internal cavity size was normal in size. There is  no left ventricular hypertrophy. Left ventricular diastolic parameters are indeterminate. Right Ventricle: The right ventricular size is mildly enlarged. No increase in right ventricular wall thickness. Right ventricular systolic function is low normal. There is severely elevated pulmonary artery systolic pressure. The tricuspid regurgitant velocity is 3.80 m/s, and with an assumed right atrial pressure of 5 mmHg, the estimated right ventricular systolic pressure is 62.8 mmHg. Left Atrium: Left atrial size was mildly dilated. Right Atrium: Right atrial size was normal in size. Pericardium: There is no evidence of pericardial effusion. Mitral Valve: The mitral valve is normal in structure. No evidence of mitral valve regurgitation. No evidence of mitral valve stenosis. Tricuspid Valve: The tricuspid valve is normal in structure. Tricuspid valve regurgitation is moderate . No evidence of tricuspid stenosis. Aortic Valve: The aortic valve is normal in structure. Aortic valve regurgitation is not visualized. No aortic stenosis is present. Aortic valve peak gradient measures 9.7 mmHg.  Pulmonic Valve: The pulmonic valve was normal in structure. Pulmonic valve regurgitation is mild. No evidence of pulmonic stenosis. Aorta: The aortic root is normal in size and structure. Venous: The inferior vena cava is normal in size with greater than 50% respiratory variability, suggesting right atrial pressure of 3 mmHg. IAS/Shunts: No atrial level shunt detected by color flow Doppler. Additional Comments: A device lead is visualized.  LEFT VENTRICLE PLAX 2D LVIDd:         4.10 cm   Diastology LVIDs:         2.90 cm   LV e' medial:    13.40 cm/s LV PW:         1.30 cm   LV E/e' medial:  9.2 LV IVS:        1.00 cm   LV e'  lateral:   14.10 cm/s LVOT diam:     1.80 cm   LV E/e' lateral: 8.7 LV SV:         55 LV SV Index:   26 LVOT Area:     2.54 cm  RIGHT VENTRICLE RV Basal diam:  3.00 cm RV S prime:     16.60 cm/s TAPSE (M-mode): 2.1 cm LEFT ATRIUM           Index        RIGHT ATRIUM           Index LA diam:      4.90 cm 2.32 cm/m   RA Area:     17.40 cm LA Vol (A2C): 62.0 ml 29.36 ml/m  RA Volume:   43.30 ml  20.50 ml/m LA Vol (A4C): 67.1 ml 31.77 ml/m  AORTIC VALVE                 PULMONIC VALVE AV Area (Vmax): 1.84 cm     PV Vmax:          0.96 m/s AV Vmax:        156.00 cm/s  PV Peak grad:     3.7 mmHg AV Peak Grad:   9.7 mmHg     PR End Diast Vel: 6.45 msec LVOT Vmax:      113.00 cm/s LVOT Vmean:     75.000 cm/s LVOT VTI:       0.218 m  AORTA Ao Root diam: 3.00 cm Ao Asc diam:  3.00 cm MITRAL VALVE                TRICUSPID VALVE MV Area (PHT): 3.34 cm     TR Peak grad:   57.8 mmHg MV Decel Time: 227 msec     TR Vmax:        380.00 cm/s MV E velocity: 123.00 cm/s MV A velocity: 35.10 cm/s   SHUNTS MV E/A ratio:  3.50         Systemic VTI:  0.22 m                             Systemic Diam: 1.80 cm [name] MD Electronically signed by [name] MD Signature Date/Time: 10/20/2023/3:16:40 PM    Final    DG Chest Port 1 View  Result Date: 10/19/2023 CLINICAL DATA:  Shortness of breath EXAM: PORTABLE CHEST 1 VIEW COMPARISON:  05/22/2023 FINDINGS: Faint patchy/interstitial opacities in the lungs bilaterally, suggesting mild multifocal infection/pneumonia, lower lobe predominant. No pleural effusions or pneumothorax. Cardiomegaly.  Right subclavian pacemaker. IMPRESSION: Suspected mild multifocal infection/pneumonia, lower  lobe predominant. Cardiomegaly. Electronically Signed   By: Charline Bills M.D.   On: 10/19/2023 01:21        Sunnie Nielsen, DO [facility] 10/21/2023, 12:05 PM    Dictation software may have been used to generate the above note. Typos may occur and escape review in  typed/dictated notes. Please contact Dr Lyn Hollingshead directly for clarity if needed.  Staff may message me via secure chat in Epic  but this may not receive an immediate response,  please page me for urgent matters!  If 7PM-7AM, please contact night coverage [email or web address]

## 2023-10-21 NOTE — Progress Notes (Signed)
Cardiology Progress Note   Patient Name: [name] [name] Date of Encounter: 10/21/2023  Primary Cardiologist: [name], MD  Subjective   Currently sitting up at bedside and feels well but has continued to have dyspnea with minimal activity, such as rolling over in bed.  She denies chest pain.  Went back into atrial flutter overnight-rate controlled. Objective   Inpatient Medications    Scheduled Meds:  amiodarone  200 mg Oral Daily   amLODipine  5 mg Oral Daily   atorvastatin  20 mg Oral Daily   calcitRIOL  0.25 mcg Oral Daily   carvedilol  25 mg Oral Daily   carvedilol  50 mg Oral QHS   ferrous sulfate  325 mg Oral Daily   guaiFENesin  600 mg Oral BID   insulin aspart  0-5 Units Subcutaneous QHS   insulin aspart  0-9 Units Subcutaneous TID WC   linagliptin  5 mg Oral Daily   multivitamin with minerals  1 tablet Oral Daily   pantoprazole  40 mg Oral Daily   potassium chloride  10 mEq Oral Daily   Continuous Infusions:  PRN Meds: acetaminophen **OR** acetaminophen, chlorpheniramine-HYDROcodone, Glycerin (Adult), ipratropium-albuterol, magnesium hydroxide, meclizine, ondansetron **OR** ondansetron (ZOFRAN) IV, polyethylene glycol, traZODone   Vital Signs    Vitals:   10/21/23 0500 10/21/23 0726 10/21/23 0755 10/21/23 1144  BP: 123/69  130/72 (!) 110/57  Pulse: 67  68 60  Resp: 18 18 18 16   Temp: 98.2 F (36.8 C)  98.6 F (37 C) 98.1 F (36.7 C)  TempSrc: Oral  Oral Oral  SpO2: 93% 94% 95% 94%  Weight:   110.5 kg   Height:       No intake or output data in the 24 hours ending 10/21/23 1208 Filed Weights   10/19/23 0045 10/21/23 0755  Weight: 108.6 kg 110.5 kg    Physical Exam   GEN: Obese, in no acute distress.  HEENT: Grossly normal.  Neck: Supple, somewhat challenging to gauge JVP due to body habitus though it does not appear to have significant JVD.  No carotid bruits or masses. Cardiac: Irregularly irregular, distant, 1/6 systolic murmur at the  upper sternal borders, no rubs or gallops. No clubbing, cyanosis, 1+ bilateral ankle edema.  Radials 2+, DP/PT 2+ and equal bilaterally.  Respiratory:  Respirations regular and unlabored, crackles about one third of the way up bilaterally. GI: Obese, soft, nontender, nondistended, BS + x 4. MS: no deformity or atrophy. Skin: warm and dry, no rash. Neuro:  Strength and sensation are intact. Psych: AAOx3.  Normal affect.  Labs    Chemistry Recent Labs  Lab 10/19/23 0048 10/19/23 0530 10/20/23 0516 10/21/23 0330  NA 137 138 140 137  K 3.6 4.1 3.9 3.6  CL 102 101 101 101  CO2 25 26 27 25   GLUCOSE 219* 377* 186* 160*  BUN 33* 34* 39* 46*  CREATININE 2.42* 2.59* 2.85* 2.97*  CALCIUM 9.1 9.1 9.1 9.0  PROT 6.8  --   --   --   ALBUMIN 3.8  --   --   --   AST 56*  --   --   --   ALT 45*  --   --   --   ALKPHOS 84  --   --   --   BILITOT 0.8  --   --   --   GFRNONAA 19* 18* 16* 15*  ANIONGAP 10 11 12 11      Hematology Recent Labs  Lab 10/16/23 1059 10/19/23 0048 10/19/23 0530  WBC 5.4 7.4 9.9  RBC 4.07 4.23 4.26  HGB 11.0* 11.4* 11.3*  HCT 32.6* 33.7* 34.6*  MCV 80.1 79.7* 81.2  MCH 27.0 27.0 26.5  MCHC 33.7 33.8 32.7  RDW 15.7* 15.5 15.6*  PLT 138* 140* 157    Cardiac Enzymes  Recent Labs  Lab 10/19/23 0048 10/19/23 0530  TROPONINIHS 17 17      BNP    Component Value Date/Time   BNP 577.9 (H) 10/19/2023 0048   Lipids  Lab Results  Component Value Date   CHOL 156 11/01/2016   HDL 58 11/01/2016   LDLCALC 79 11/01/2016   TRIG 93 11/01/2016   CHOLHDL 2.7 11/01/2016    HbA1c  Lab Results  Component Value Date   HGBA1C 7.2 09/26/2023   Lab Results  Component Value Date   INR 3.3 (H) 10/21/2023   INR 3.1 (H) 10/20/2023   INR 2.3 (H) 10/19/2023   PROTIME 33.0 03/15/2016    Radiology    DG Chest Port 1 View  Result Date: 10/19/2023 CLINICAL DATA:  Shortness of breath EXAM: PORTABLE CHEST 1 VIEW COMPARISON:  05/22/2023 FINDINGS: Faint  patchy/interstitial opacities in the lungs bilaterally, suggesting mild multifocal infection/pneumonia, lower lobe predominant. No pleural effusions or pneumothorax. Cardiomegaly.  Right subclavian pacemaker. IMPRESSION: Suspected mild multifocal infection/pneumonia, lower lobe predominant. Cardiomegaly. Electronically Signed   By: Charline Bills M.D.   On: 10/19/2023 01:21     Telemetry    Started having recurrent atrial flutter around 130 this morning.  V pacing on demand.- Personally Reviewed  Cardiac Studies   2D Echocardiogram 11.16.2024  1. Left ventricular ejection fraction, by estimation, is 55 to 60%. The  left ventricle has normal function. The left ventricle has no regional  wall motion abnormalities. Left ventricular diastolic parameters are  indeterminate.   2. Right ventricular systolic function is low normal. The right  ventricular size is mildly enlarged. There is severely elevated pulmonary  artery systolic pressure. The estimated right ventricular systolic  pressure is 62.8 mmHg.   3. Left atrial size was mildly dilated.   4. The mitral valve is normal in structure. No evidence of mitral valve  regurgitation. No evidence of mitral stenosis.   5. Tricuspid valve regurgitation is moderate.   6. The aortic valve is normal in structure. Aortic valve regurgitation is  not visualized. No aortic stenosis is present.   7. The inferior vena cava is normal in size with greater than 50%  respiratory variability, suggesting right atrial pressure of 3 mmHg.  _____________   Patient Profile     88 y.o. female with a history of w/ a h/o PAF on amio/warfarin, chest pain w/ nl cors on remote cath, HTN, HL, DMII, CKD IV, chronic HFpEF, pulm HTN, mod MR/TR, symptomatic PVCs, OSA on CPAP, and SSS s/p MDT PPM, admitted with acute on chronic respiratory failure and HFpEF, complicated by acute kidney injury/cardiorenal syndrome.  Now with recurrent atrial flutter.  Assessment & Plan     1.  Acute on chronic HFpEF/pulmonary hypertension: Patient with chronic dyspnea with acute worsening over the past several months in the setting of development of atrial flutter.  She recently underwent cardioversion but converted back to atrial flutter overnight.  She was admitted with worsening dyspnea (sinus rhythm on presentation) and chest x-ray with suspected mild multifocal infection/pneumonia with BNP elevation of 577.9.  With diuresis, creatinine has risen from 2.42 on the 15th to 2.97 this morning.  Lasix on hold over the past 24 hours, though she was -1.6 L yesterday.  Weight is up 1.9 kg over the past 48 hours.  She has crackles on exam and 1+ ankle edema, which she says is chronic.  Echo shows normal LV function though RVSP is 62.8 with low normal RV function.  Will hold warfarin and plan to bridge with heparin once INR less than 2.  Will ask heart failure team to see in the a.m. and keep n.p.o. in case right heart cath feasible (follow-up INR).  Unclear role of more frequent V pacing since device adjustments earlier this year in the setting of prolonged AV delay.  Will ask EP to see as well.  2.  Persistent atrial flutter: Status post cardioversion on the 13th with recurrent atrial flutter overnight and this morning.  She is rate controlled.  INR 3.3 this morning.  Holding warfarin in light of likely need for right heart catheterization.  Will ask EP to evaluate regarding recurrent arrhythmia despite amiodarone therapy and concerns related to V pacing and heart failure.  3.  Acute on chronic stage IV kidney disease: As above, worsening creatinine in the setting of diuretic therapy, which is now on hold since yesterday morning.  Recommend nephrology evaluation.  4.  Primary hypertension: Pressure stable on beta-blocker and amlodipine.  5.  Hyperlipidemia: Continue statin.  Signed, Nicolasa Ducking, NP  10/21/2023, 12:08 PM    For questions or updates, please contact   Please consult  [email or web address] for contact info under Cardiology/STEMI.

## 2023-10-22 ENCOUNTER — Encounter: Payer: Self-pay | Admitting: Osteopathic Medicine

## 2023-10-22 ENCOUNTER — Inpatient Hospital Stay: Payer: PPO

## 2023-10-22 DIAGNOSIS — I4892 Unspecified atrial flutter: Secondary | ICD-10-CM | POA: Diagnosis not present

## 2023-10-22 DIAGNOSIS — I509 Heart failure, unspecified: Secondary | ICD-10-CM | POA: Diagnosis not present

## 2023-10-22 DIAGNOSIS — R0602 Shortness of breath: Secondary | ICD-10-CM | POA: Diagnosis not present

## 2023-10-22 LAB — CBC
HCT: 27.2 % — ABNORMAL LOW (ref 36.0–46.0)
Hemoglobin: 9.5 g/dL — ABNORMAL LOW (ref 12.0–15.0)
MCH: 26.8 pg (ref 26.0–34.0)
MCHC: 34.9 g/dL (ref 30.0–36.0)
MCV: 76.8 fL — ABNORMAL LOW (ref 80.0–100.0)
Platelets: 127 10*3/uL — ABNORMAL LOW (ref 150–400)
RBC: 3.54 MIL/uL — ABNORMAL LOW (ref 3.87–5.11)
RDW: 14.9 % (ref 11.5–15.5)
WBC: 6.6 10*3/uL (ref 4.0–10.5)
nRBC: 0 % (ref 0.0–0.2)

## 2023-10-22 LAB — GLUCOSE, CAPILLARY
Glucose-Capillary: 176 mg/dL — ABNORMAL HIGH (ref 70–99)
Glucose-Capillary: 217 mg/dL — ABNORMAL HIGH (ref 70–99)
Glucose-Capillary: 169 mg/dL — ABNORMAL HIGH (ref 70–99)
Glucose-Capillary: 228 mg/dL — ABNORMAL HIGH (ref 70–99)

## 2023-10-22 LAB — BASIC METABOLIC PANEL
Anion gap: 13 (ref 5–15)
BUN: 52 mg/dL — ABNORMAL HIGH (ref 8–23)
CO2: 24 mmol/L (ref 22–32)
Calcium: 9.1 mg/dL (ref 8.9–10.3)
Chloride: 100 mmol/L (ref 98–111)
Creatinine, Ser: 2.96 mg/dL — ABNORMAL HIGH (ref 0.44–1.00)
GFR, Estimated: 15 mL/min — ABNORMAL LOW (ref >60)
Glucose, Bld: 159 mg/dL — ABNORMAL HIGH (ref 70–99)
Potassium: 3.4 mmol/L — ABNORMAL LOW (ref 3.5–5.1)
Sodium: 137 mmol/L (ref 135–145)

## 2023-10-22 LAB — PROTIME-INR
INR: 2.6 — ABNORMAL HIGH (ref 0.8–1.2)
Prothrombin Time: 27.7 s — ABNORMAL HIGH (ref 11.4–15.2)

## 2023-10-22 LAB — T4, FREE: Free T4: 1.66 ng/dL — ABNORMAL HIGH (ref 0.61–1.12)

## 2023-10-22 LAB — TSH: TSH: 0.27 u[IU]/mL — ABNORMAL LOW (ref 0.350–4.500)

## 2023-10-22 LAB — MAGNESIUM: Magnesium: 2.2 mg/dL (ref 1.7–2.4)

## 2023-10-22 MED ORDER — POTASSIUM CHLORIDE CRYS ER 20 MEQ PO TBCR
20.0000 meq | EXTENDED_RELEASE_TABLET | Freq: Once | ORAL | Status: AC
  Administered 2023-10-22: 20 meq via ORAL
  Filled 2023-10-22: qty 1

## 2023-10-22 NOTE — Plan of Care (Signed)
  Problem: Education: Goal: Knowledge of General Education information will improve Description: Including pain rating scale, medication(s)/side effects and non-pharmacologic comfort measures Outcome: Progressing   Problem: Health Behavior/Discharge Planning: Goal: Ability to manage health-related needs will improve Outcome: Progressing   Problem: Clinical Measurements: Goal: Ability to maintain clinical measurements within normal limits will improve Outcome: Progressing Goal: Will remain free from infection Outcome: Progressing Goal: Diagnostic test results will improve Outcome: Progressing Goal: Respiratory complications will improve Outcome: Progressing Goal: Cardiovascular complication will be avoided Outcome: Progressing   Problem: Activity: Goal: Risk for activity intolerance will decrease Outcome: Progressing   Problem: Nutrition: Goal: Adequate nutrition will be maintained Outcome: Progressing   Problem: Coping: Goal: Level of anxiety will decrease Outcome: Progressing   Problem: Elimination: Goal: Will not experience complications related to bowel motility Outcome: Progressing Goal: Will not experience complications related to urinary retention Outcome: Progressing   Problem: Pain Management: Goal: General experience of comfort will improve Outcome: Progressing   Problem: Safety: Goal: Ability to remain free from injury will improve Outcome: Progressing   Problem: Skin Integrity: Goal: Risk for impaired skin integrity will decrease Outcome: Progressing   Problem: Education: Goal: Ability to describe self-care measures that may prevent or decrease complications (Diabetes Survival Skills Education) will improve Outcome: Progressing Goal: Individualized Educational Video(s) Outcome: Progressing   Problem: Coping: Goal: Ability to adjust to condition or change in health will improve Outcome: Progressing   Problem: Fluid Volume: Goal: Ability to  maintain a balanced intake and output will improve Outcome: Progressing   Problem: Health Behavior/Discharge Planning: Goal: Ability to identify and utilize available resources and services will improve Outcome: Progressing Goal: Ability to manage health-related needs will improve Outcome: Progressing   Problem: Metabolic: Goal: Ability to maintain appropriate glucose levels will improve Outcome: Progressing   Problem: Nutritional: Goal: Maintenance of adequate nutrition will improve Outcome: Progressing Goal: Progress toward achieving an optimal weight will improve Outcome: Progressing   Problem: Skin Integrity: Goal: Risk for impaired skin integrity will decrease Outcome: Progressing   Problem: Tissue Perfusion: Goal: Adequacy of tissue perfusion will improve Outcome: Progressing   Problem: Education: Goal: Ability to demonstrate management of disease process will improve Outcome: Progressing Goal: Ability to verbalize understanding of medication therapies will improve Outcome: Progressing Goal: Individualized Educational Video(s) Outcome: Progressing   Problem: Activity: Goal: Capacity to carry out activities will improve Outcome: Progressing   Problem: Cardiac: Goal: Ability to achieve and maintain adequate cardiopulmonary perfusion will improve Outcome: Progressing

## 2023-10-22 NOTE — Consult Note (Signed)
ELECTROPHYSIOLOGY CONSULT NOTE    Patient ID: [name] [name] MRN: [id number], DOB/AGE: 1935/07/02 88 y.o.  Admit date: 10/19/2023 Date of Consult: 10/22/2023  Primary Physician: Dale LaBelle, MD Primary Cardiologist: [name], MD  Electrophysiologist: Dr. Lalla Brothers   Referring Provider: Dr. Ashok Pall  Patient Profile: [name] [name] is a 88 y.o. female with a history of tachy-brady s/p PPM, parox AFib on amiodarone and warfarin, Aflutter, pulmHTN, HFpEF, HTN, T2DM, CKD stage 4, OSA on CPAP who is being seen today for the evaluation of SOB and atrial flutter at the request of Dr. Ashok Pall.  HPI:  [name] [name] is a 88 y.o. female with PMH as above with progressive SOB dating to 02/2023. TTE, Myoview, and pulm workup was unrevealing at the time. She has very long intrinsic 1st deg HB, her paced AV delay was decreased 08/2023 to promote a more physiologic PR interval in attempt to improve SOB.   She was seen by Dr. Mariah Milling 11/4 with progressive DOE and lower extremity edema, device interrogation revealed atrial flutter x about a month. She underwent successful DCCV 11/13. The following morning she woke up feeling well and throughout the day became more SOB. She noted increased palpitations and SOB with walking to bathroom, so called EMS.   On admission, she was in sinus rhythm. CXR w concerns for multifocal infx. Labs stable. She was diuresed with IV lasix x 3, but BUN and Cr have risen.  Updated echo shows preserved LVEF with severe pulm HTN.  Her amiodarone has been continued.  Her warfarin is on hold for RHC tomorrow She converted to aflutter overnight 11/17  Currently, she does not feel SOB with sitting in chair, but does become SOB with walking to bathroom.  She denies chest pain, palpitations, nausea, vomiting, dizziness, syncope, or early satiety.   Labs Potassium3.4* (11/18 6962) Magnesium  2.2 (11/18 0518) Creatinine, ser  2.96* (11/18 0518) PLT  127* (11/18 0518) HGB  9.5*  (11/18 0518) WBC 6.6 (11/18 0518)  .    Past Medical History:  Diagnosis Date   1st degree AV block    a. PR 400 msec with Apacing   A-fib (HCC)    Hx of   Anemia    Anxiety    Arthritis    knees and lower back    Chest pain    a. 2004 Cath:  reportedly nl;  b. 09/2013 Neg MV; c. 11/2017 nl ETT; d. 08/2023 MV: No isch/infarct. nl LV fxn. Mild Ao Ca2+.   Chronic diastolic heart failure (HCC)    a. 09/2013 Echo: EF 50-55%, mild LVH, mild to mod MR/TR; b. 04/2023 Echo: EF 55-60%, no rwma, nl RV size/fxn, RVSP 44.54mmHg. Mod dil LA. Mod MR/TR.   CKD (chronic kidney disease), stage IV (HCC)    Colon polyps    Congestive heart disease (HCC)    Esophageal reflux    Glaucoma    Gout    H/O hiatal hernia    History of UTI    Hyperlipidemia    Moderate mitral regurgitation    a. 04/2023 Echo: Mod MR.   Morbid obesity (HCC)    OSA (obstructive sleep apnea)    OSA on CPAP    CPAP   Pacemaker    copy of medtronic card in chart   PAF (paroxysmal atrial fibrillation) (HCC)    a. 10/2023 s/p DCCV (150J); b. CHA2DS2VASc = 5-->warfarin.   Primary hypertension    controlled on meds   Pulmonary hypertension (HCC)  a. 04/2023 Echo: RVSP 44.24mmHg.   Seasonal allergies    SSS (sick sinus syndrome) (HCC)    a. 2008 s/p MDT PPM;  b. 11/2013 Gen change- MDT ADDRL1 Adapta DC PPM, ser # [id number] H.   Symptomatic PVCs    Syncope and collapse    a. Felt to be vasovagal.   Thyroid disease    nodules on thyroid, with goiter, on no meds per pt   Tricuspid regurgitation    Type II diabetes mellitus (HCC)    Type 2   Unspecified glaucoma(365.9)    Vertigo    in past   Wears dentures    upper full and lower partial     Surgical History:  Past Surgical History:  Procedure Laterality Date   APPENDECTOMY     CARDIAC CATHETERIZATION     CARDIOVERSION N/A 04/26/2017   Procedure: Cardioversion;  Surgeon: Antonieta Iba, MD;  Location: [facility];  Service: Cardiovascular;  Laterality: N/A;    CARDIOVERSION N/A 10/17/2023   Procedure: CARDIOVERSION;  Surgeon: Antonieta Iba, MD;  Location: [facility];  Service: Cardiovascular;  Laterality: N/A;   CATARACT EXTRACTION W/PHACO Left 07/21/2020   Procedure: CATARACT EXTRACTION PHACO AND INTRAOCULAR LENS PLACEMENT (IOC) LEFT DIABETIC;  Surgeon: Lockie Mola, MD;  Location: [facility];  Service: Ophthalmology;  Laterality: Left;  10.25 1:13.2 14.0%   CATARACT EXTRACTION W/PHACO Right 08/11/2020   Procedure: CATARACT EXTRACTION PHACO AND INTRAOCULAR LENS PLACEMENT (IOC) RIGHT DIABETIC;  Surgeon: Lockie Mola, MD;  Location: [facility];  Service: Ophthalmology;  Laterality: Right;  11.63 1:25.0 13.7%   DILATION AND CURETTAGE OF UTERUS     INCISION AND DRAINAGE OF WOUND Right 03/1998   "leg; it was like a boil" (12/01/2013)   INSERT / REPLACE / REMOVE PACEMAKER  2008; 12/01/2013   MDT ADDRL1 pacemaker - gen change by Dr Graciela Husbands 12/01/2013   LUNG BIOPSY  2010   [facility]   PERMANENT PACEMAKER GENERATOR CHANGE N/A 12/01/2013   Procedure: PERMANENT PACEMAKER GENERATOR CHANGE;  Surgeon: Duke Salvia, MD;  Location: [facility];  Service: Cardiovascular;  Laterality: N/A;   VAGINAL HYSTERECTOMY  1970     Medications Prior to Admission  Medication Sig Dispense Refill Last Dose   allopurinol (ZYLOPRIM) 100 MG tablet Take 100 mg by mouth daily.   10/18/2023 at AM   amiodarone (PACERONE) 200 MG tablet TAKE 1 TABLET BY MOUTH EVERY DAY 90 tablet 0 10/18/2023 at AM   amLODipine (NORVASC) 5 MG tablet Take 5 mg by mouth daily.   10/18/2023 at AM   atorvastatin (LIPITOR) 20 MG tablet TAKE 1 TABLET (20 MG TOTAL) BY MOUTH DAILY. 90 tablet 0 10/18/2023 at AM   calcitRIOL (ROCALTROL) 0.25 MCG capsule TAKE 1 CAPSULE (0.25 MCG TOTAL) BY MOUTH 1 (ONE) TIME EACH DAY   10/18/2023   carvedilol (COREG) 25 MG tablet Take 1-2 tablets (25-50 mg total) by mouth as directed. Take 1 tablet (25MG ) by mouth every morning and 2 tablets (50MG ) by  mouth every night before bed (Patient taking differently: Take 25-50 mg by mouth See admin instructions. Take 1 tablet (25MG ) by mouth every morning and 2 tablets (50MG ) by mouth every night before bed) 270 tablet 3 10/18/2023   furosemide (LASIX) 40 MG tablet Take 40 mg by mouth daily.   10/18/2023   glipiZIDE (GLUCOTROL) 10 MG tablet Take 10 mg by mouth daily before breakfast.   10/18/2023   KLOR-CON M10 10 MEQ tablet Take 10 mEq by mouth daily.  3 10/18/2023   pantoprazole (PROTONIX) 40 MG tablet Take 40 mg by mouth daily.   10/18/2023   sitaGLIPtin (JANUVIA) 25 MG tablet Take 25 mg by mouth daily.   10/18/2023   warfarin (COUMADIN) 2 MG tablet Take 2 mg by mouth daily at 6 PM.   10/18/2023   acetaminophen (TYLENOL) 500 MG tablet Take 1,000 mg by mouth every 6 (six) hours as needed for moderate pain.       Blood Glucose Monitoring Suppl (FIFTY50 GLUCOSE METER 2.0) w/Device KIT       ferrous sulfate 325 (65 FE) MG EC tablet Take 325 mg by mouth daily.       Glycerin, Laxative, (FLEET LIQUID GLYCERIN SUPP RE) Place 1 Dose rectally daily as needed (constipation).      meclizine (ANTIVERT) 25 MG tablet Take 25 mg by mouth 3 (three) times daily as needed for dizziness or nausea.   PRN at PRN   Multiple Vitamins-Minerals (CENTRUM SILVER PO) Take 1 tablet by mouth daily.       OneTouch Delica Lancets 33G MISC       ONETOUCH VERIO test strip 3 (three) times daily.      polyethylene glycol (MIRALAX / GLYCOLAX) packet Take 17 g by mouth daily as needed for moderate constipation.   10/17/2023   White Petrolatum-Mineral Oil (ARTIFICIAL TEARS) ointment Place 1 drop into both eyes daily as needed (Dry eyes).       Inpatient Medications:   amiodarone  200 mg Oral Daily   amLODipine  5 mg Oral Daily   atorvastatin  20 mg Oral Daily   calcitRIOL  0.25 mcg Oral Daily   carvedilol  25 mg Oral Daily   carvedilol  50 mg Oral QHS   ferrous sulfate  325 mg Oral Daily   guaiFENesin  600 mg Oral BID   insulin  aspart  0-5 Units Subcutaneous QHS   insulin aspart  0-9 Units Subcutaneous TID WC   linagliptin  5 mg Oral Daily   multivitamin with minerals  1 tablet Oral Daily   pantoprazole  40 mg Oral Daily   potassium chloride  10 mEq Oral Daily    Allergies:  Allergies  Allergen Reactions   Metoprolol Other (See Comments)    Bad dreams Patient stated that she had suicidal thoughts while taking this medication.    Diltiazem Other (See Comments)    Patient stated that she had bad dreams with this medication   Levofloxacin Nausea And Vomiting and Nausea Only         Family History  Problem Relation Age of Onset   Cancer Mother    Liver cancer Mother    Pancreatic cancer Mother    Heart disease Father    Rheum arthritis Father    Allergies Father    Hypertension Sister    Rheum arthritis Sister    COPD Brother    Breast cancer Maternal Aunt      Physical Exam: Vitals:   10/22/23 0333 10/22/23 0747 10/22/23 0800 10/22/23 0930  BP: (!) 105/48 122/79    Pulse: (!) 59 66    Resp: 18 15    Temp:  98.4 F (36.9 C)    TempSrc:      SpO2: 93% 98% 92% 92%  Weight:      Height:        GEN- NAD, A&O x 3, normal affect HEENT: Normocephalic, atraumatic Lungs- CTAB, diminished in bases, Normal effort.  Heart- Regular rate and rhythm,  No M/G/R.  GI- Soft, NT, ND.  Extremities- No clubbing, cyanosis, +1 bilat lower extremity edema   Radiology/Studies: ECHOCARDIOGRAM COMPLETE  Result Date: 10/20/2023    ECHOCARDIOGRAM REPORT   Patient Name:   [name] [name] Date of Exam: 10/20/2023 Medical Rec #:  [id number]    Height:       64.0 in Accession #:    [id number]   Weight:       239.4 lb Date of Birth:  02-12-1935   BSA:          2.112 m Patient Age:    87 years     BP:           130/64 mmHg Patient Gender: F            HR:           60 bpm. Exam Location:  [facility] Procedure: 2D Echo Indications:     CHF I50.31  History:         Patient has prior history of Echocardiogram examinations, most                   recent 04/25/2023.  Sonographer:     Overton Mam RDCS, FASE Referring Phys:  [id number] Sunnie Nielsen Diagnosing Phys: [name] MD IMPRESSIONS  1. Left ventricular ejection fraction, by estimation, is 55 to 60%. The left ventricle has normal function. The left ventricle has no regional wall motion abnormalities. Left ventricular diastolic parameters are indeterminate.  2. Right ventricular systolic function is low normal. The right ventricular size is mildly enlarged. There is severely elevated pulmonary artery systolic pressure. The estimated right ventricular systolic pressure is 62.8 mmHg.  3. Left atrial size was mildly dilated.  4. The mitral valve is normal in structure. No evidence of mitral valve regurgitation. No evidence of mitral stenosis.  5. Tricuspid valve regurgitation is moderate.  6. The aortic valve is normal in structure. Aortic valve regurgitation is not visualized. No aortic stenosis is present.  7. The inferior vena cava is normal in size with greater than 50% respiratory variability, suggesting right atrial pressure of 3 mmHg. FINDINGS  Left Ventricle: Left ventricular ejection fraction, by estimation, is 55 to 60%. The left ventricle has normal function. The left ventricle has no regional wall motion abnormalities. The left ventricular internal cavity size was normal in size. There is  no left ventricular hypertrophy. Left ventricular diastolic parameters are indeterminate. Right Ventricle: The right ventricular size is mildly enlarged. No increase in right ventricular wall thickness. Right ventricular systolic function is low normal. There is severely elevated pulmonary artery systolic pressure. The tricuspid regurgitant velocity is 3.80 m/s, and with an assumed right atrial pressure of 5 mmHg, the estimated right ventricular systolic pressure is 62.8 mmHg. Left Atrium: Left atrial size was mildly dilated. Right Atrium: Right atrial size was normal in size.  Pericardium: There is no evidence of pericardial effusion. Mitral Valve: The mitral valve is normal in structure. No evidence of mitral valve regurgitation. No evidence of mitral valve stenosis. Tricuspid Valve: The tricuspid valve is normal in structure. Tricuspid valve regurgitation is moderate . No evidence of tricuspid stenosis. Aortic Valve: The aortic valve is normal in structure. Aortic valve regurgitation is not visualized. No aortic stenosis is present. Aortic valve peak gradient measures 9.7 mmHg. Pulmonic Valve: The pulmonic valve was normal in structure. Pulmonic valve regurgitation is mild. No evidence of pulmonic stenosis. Aorta: The aortic root is normal in size and structure. Venous: The  inferior vena cava is normal in size with greater than 50% respiratory variability, suggesting right atrial pressure of 3 mmHg. IAS/Shunts: No atrial level shunt detected by color flow Doppler. Additional Comments: A device lead is visualized.  LEFT VENTRICLE PLAX 2D LVIDd:         4.10 cm   Diastology LVIDs:         2.90 cm   LV e' medial:    13.40 cm/s LV PW:         1.30 cm   LV E/e' medial:  9.2 LV IVS:        1.00 cm   LV e' lateral:   14.10 cm/s LVOT diam:     1.80 cm   LV E/e' lateral: 8.7 LV SV:         55 LV SV Index:   26 LVOT Area:     2.54 cm  RIGHT VENTRICLE RV Basal diam:  3.00 cm RV S prime:     16.60 cm/s TAPSE (M-mode): 2.1 cm LEFT ATRIUM           Index        RIGHT ATRIUM           Index LA diam:      4.90 cm 2.32 cm/m   RA Area:     17.40 cm LA Vol (A2C): 62.0 ml 29.36 ml/m  RA Volume:   43.30 ml  20.50 ml/m LA Vol (A4C): 67.1 ml 31.77 ml/m  AORTIC VALVE                 PULMONIC VALVE AV Area (Vmax): 1.84 cm     PV Vmax:          0.96 m/s AV Vmax:        156.00 cm/s  PV Peak grad:     3.7 mmHg AV Peak Grad:   9.7 mmHg     PR End Diast Vel: 6.45 msec LVOT Vmax:      113.00 cm/s LVOT Vmean:     75.000 cm/s LVOT VTI:       0.218 m  AORTA Ao Root diam: 3.00 cm Ao Asc diam:  3.00 cm MITRAL VALVE                 TRICUSPID VALVE MV Area (PHT): 3.34 cm     TR Peak grad:   57.8 mmHg MV Decel Time: 227 msec     TR Vmax:        380.00 cm/s MV E velocity: 123.00 cm/s MV A velocity: 35.10 cm/s   SHUNTS MV E/A ratio:  3.50         Systemic VTI:  0.22 m                             Systemic Diam: 1.80 cm [name] MD Electronically signed by [name] MD Signature Date/Time: 10/20/2023/3:16:40 PM    Final    DG Chest Port 1 View  Result Date: 10/19/2023 CLINICAL DATA:  Shortness of breath EXAM: PORTABLE CHEST 1 VIEW COMPARISON:  05/22/2023 FINDINGS: Faint patchy/interstitial opacities in the lungs bilaterally, suggesting mild multifocal infection/pneumonia, lower lobe predominant. No pleural effusions or pneumothorax. Cardiomegaly.  Right subclavian pacemaker. IMPRESSION: Suspected mild multifocal infection/pneumonia, lower lobe predominant. Cardiomegaly. Electronically Signed   By: Charline Bills M.D.   On: 10/19/2023 01:21   CUP PACEART REMOTE DEVICE CHECK  Result Date: 09/24/2023 Scheduled remote reviewed.  Normal device function.  Persistent AF episode since 09/03/2023, on OAC, AF burden is 86.3% of the time, sent to triage for persistence Next remote 91 days. ML, CVRS   EKG:11/15 at 0046: AV paced at 63 (personally reviewed)  TELEMETRY: aflutter 60-70s (personally reviewed)  DEVICE HISTORY: MDT dual chamber PPM Battery good Lead measurements stable 95% VP  Increased AV delay to   Assessment/Plan: #) aflutter Patient's ongoing SOB pre-dates her atrial flutter Not candidate for invasive EP procedures Ventricular rates well-controlled Continue amiodarone at this time Update thyroid labs LFTs slightly elevated Coumadin on hold for RHC tomorrow   #) SOB #) pulm HTN #) AoC HFpEF #) AoCKD, stage 4 RVSP Received IV lasix x 3 with worsening Cr and Bun, currently holding diuretics Agree with RHC soon to eval hemodynamics   #) SND s/p PPM Increased AV  delay to promote intrinsic conduction       For questions or updates, please contact [facility] Please consult [email or web address] for contact info under Cardiology/STEMI.  Signed, Sherie Don, NP  10/22/2023 1:57 PM

## 2023-10-22 NOTE — Plan of Care (Signed)
  Problem: Education: Goal: Knowledge of General Education information will improve Description: Including pain rating scale, medication(s)/side effects and non-pharmacologic comfort measures Outcome: Progressing   Problem: Clinical Measurements: Goal: Ability to maintain clinical measurements within normal limits will improve Outcome: Progressing   Problem: Clinical Measurements: Goal: Respiratory complications will improve Outcome: Progressing   Problem: Clinical Measurements: Goal: Cardiovascular complication will be avoided Outcome: Progressing   Problem: Cardiac: Goal: Ability to achieve and maintain adequate cardiopulmonary perfusion will improve Outcome: Progressing

## 2023-10-22 NOTE — Progress Notes (Signed)
Education Assessment and Provision:  Advanced Heart Failure Team patient. Detailed education and instructions provided on heart failure disease management including the following:  Signs and symptoms of Heart Failure When to call the physician Importance of daily weights Low sodium diet Fluid restriction Medication management Anticipated future follow-up appointments  Patient education given on each of the above topics.  Patient acknowledges understanding via teach back method and acceptance of all instructions.  Education Materials:  "Living Better With Heart Failure" Booklet, HF zone tool, & Daily Weight Tracker Tool.  Patient has scale at home: Yes Patient has pill box at home: Yes  Patient understands all information provided.  Available appointment scheduled with Clarisa Kindred for follow-up on 10/29/23 @ 9:00 am.  Navigator will sign off at this time.  Roxy Horseman, RN, BSN [facility] Heart Failure Navigator Secure Chat Only

## 2023-10-22 NOTE — TOC Progression Note (Signed)
Transition of Care [facility]) - Progression Note    Patient Details  Name: [name] [name] MRN: [id number] Date of Birth: Dec 08, 1934  [facility]) CM/SW Contact  Darolyn Rua, [place] Phone Number: 10/22/2023, 9:05 AM  Clinical Narrative:     11/18: No toc needs indicated at this time, please consult toc should discharge planning needs arise.   11/15: CSW met with patient and sons at bedside. Patient asleep and resting well. Patient son Trey Paula and Jonna Coup reported information for patient. Patient will be admitted to the hospital. Patients sons reported patient "probably would not go into SNF, but will be more agreeable to home health aide at home. Awaiting admission to the hospital.         Expected Discharge Plan and Services                                               Social Determinants of Health (SDOH) Interventions SDOH Screenings   Food Insecurity: Patient Declined (10/19/2023)  Housing: Patient Declined (10/19/2023)  Transportation Needs: Patient Declined (10/19/2023)  Utilities: Patient Declined (10/19/2023)  Depression (PHQ2-9): Medium Risk (10/11/2023)  Financial Resource Strain: Unknown (12/01/2018)  Physical Activity: Unknown (12/01/2018)  Social Connections: Unknown (12/01/2018)  Stress: Unknown (12/01/2018)  Tobacco Use: Medium Risk (10/19/2023)    Readmission Risk Interventions    10/19/2023    1:41 PM  Readmission Risk Prevention Plan  Transportation Screening Complete  PCP or Specialist Appt within 5-7 Days Complete  Home Care Screening Complete  Medication Review (RN CM) Not Complete  Med Review comments patient asleep; CSW spoke with her sons Trey Paula and [place]

## 2023-10-22 NOTE — Consult Note (Signed)
Advanced Heart Failure Team Consult Note   Primary Physician: Dale Boerne, MD PCP-Cardiologist:  [name], MD  Reason for Consultation: Heart Failure   HPI:    [name] [name] is seen today for evaluation of heart failure at the request of Dr Mariah Milling .   [name] [name] is a 88 year old with a history of with a history fo PAF, SSS  MDT PPM, HTN, OSA, HLD, DMII, CKD Stage IV, PVCs, anemia, and HFpEF.   First noticed dyspnea with exertion in March of this year but she thought it was normal. She saw EP in June and was complaining of shortness of breath. Device showed long 1st degree block, device programmed .Continued on amio. PFTs /CXR ordered.    Over the next few months she was seen by cardiology  and EP  and continue to complain of shortness of breath.  Referred to pulmonary due to ongoing dyspnea. PFTs showed mild restriction and mild restrictive patter without obstruction.   On 10/08/23 she was seen by Dr Mariah Milling and had been in A flutter for a little over a months. Had cardioversion 10/17/23 with conversion to SR.   On 10/19/23 she woke up with acute shortness of breath and orthopnea. CXR with possible multifocal PNA. Treated with rocephin and zithromycin. Lactic acid 0.7, blood cultures no growth,  SARS2 negative. Started on IV lasix. Developed worsening renal function.   Echo EF 55-60% RVSP 62.8 and RV low normal.     Home Medications Prior to Admission medications   Medication Sig Start Date End Date Taking? Authorizing Provider  allopurinol (ZYLOPRIM) 100 MG tablet Take 100 mg by mouth daily. 01/06/21  Yes [provider]  amiodarone (PACERONE) 200 MG tablet TAKE 1 TABLET BY MOUTH EVERY DAY 10/12/23  Yes Lanier Prude, MD  amLODipine (NORVASC) 5 MG tablet Take 5 mg by mouth daily. 08/20/21  Yes [provider]  atorvastatin (LIPITOR) 20 MG tablet TAKE 1 TABLET (20 MG TOTAL) BY MOUTH DAILY. 02/02/17  Yes Ellyn Hack, MD  calcitRIOL (ROCALTROL) 0.25  MCG capsule TAKE 1 CAPSULE (0.25 MCG TOTAL) BY MOUTH 1 (ONE) TIME EACH DAY 05/12/21  Yes [provider]  carvedilol (COREG) 25 MG tablet Take 1-2 tablets (25-50 mg total) by mouth as directed. Take 1 tablet (25MG ) by mouth every morning and 2 tablets (50MG ) by mouth every night before bed Patient taking differently: Take 25-50 mg by mouth See admin instructions. Take 1 tablet (25MG ) by mouth every morning and 2 tablets (50MG ) by mouth every night before bed 02/19/23  Yes Gollan, Tollie Pizza, MD  furosemide (LASIX) 40 MG tablet Take 40 mg by mouth daily.   Yes [provider]  glipiZIDE (GLUCOTROL) 10 MG tablet Take 10 mg by mouth daily before breakfast.   Yes [provider]  KLOR-CON M10 10 MEQ tablet Take 10 mEq by mouth daily. 06/12/18  Yes [provider]  pantoprazole (PROTONIX) 40 MG tablet Take 40 mg by mouth daily. 11/29/20  Yes [provider]  sitaGLIPtin (JANUVIA) 25 MG tablet Take 25 mg by mouth daily. 05/23/21  Yes [provider]  warfarin (COUMADIN) 2 MG tablet Take 2 mg by mouth daily at 6 PM.   Yes [provider]  acetaminophen (TYLENOL) 500 MG tablet Take 1,000 mg by mouth every 6 (six) hours as needed for moderate pain.     [provider]  Blood Glucose Monitoring Suppl (FIFTY50 GLUCOSE METER 2.0) w/Device KIT  07/06/20   [provider]  ferrous sulfate 325 (65 FE) MG EC tablet Take 325 mg by mouth daily.  03/01/17   [provider]  Glycerin, Laxative, (FLEET LIQUID GLYCERIN SUPP RE) Place 1 Dose rectally daily as needed (constipation).    [provider]  meclizine (ANTIVERT) 25 MG tablet Take 25 mg by mouth 3 (three) times daily as needed for dizziness or nausea. 12/12/21   [provider]  Multiple Vitamins-Minerals (CENTRUM SILVER PO) Take 1 tablet by mouth daily.     [provider]  OneTouch Delica Lancets 33G MISC  07/20/20   [provider]  [facility] VERIO  test strip 3 (three) times daily. 06/08/21   [provider]  polyethylene glycol (MIRALAX / GLYCOLAX) packet Take 17 g by mouth daily as needed for moderate constipation.    [provider]  Rayburn Ma Oil (ARTIFICIAL TEARS) ointment Place 1 drop into both eyes daily as needed (Dry eyes).    [provider]    Past Medical History: Past Medical History:  Diagnosis Date   1st degree AV block    a. PR 400 msec with Apacing   A-fib (HCC)    Hx of   Anemia    Anxiety    Arthritis    knees and lower back    Chest pain    a. 2004 Cath:  reportedly nl;  b. 09/2013 Neg MV; c. 11/2017 nl ETT; d. 08/2023 MV: No isch/infarct. nl LV fxn. Mild Ao Ca2+.   Chronic diastolic heart failure (HCC)    a. 09/2013 Echo: EF 50-55%, mild LVH, mild to mod MR/TR; b. 04/2023 Echo: EF 55-60%, no rwma, nl RV size/fxn, RVSP 44.22mmHg. Mod dil LA. Mod MR/TR.   CKD (chronic kidney disease), stage IV (HCC)    Colon polyps    Congestive heart disease (HCC)    Esophageal reflux    Glaucoma    Gout    H/O hiatal hernia    History of UTI    Hyperlipidemia    Moderate mitral regurgitation    a. 04/2023 Echo: Mod MR.   Morbid obesity (HCC)    OSA (obstructive sleep apnea)    OSA on CPAP    CPAP   Pacemaker    copy of medtronic card in chart   PAF (paroxysmal atrial fibrillation) (HCC)    a. 10/2023 s/p DCCV (150J); b. CHA2DS2VASc = 5-->warfarin.   Primary hypertension    controlled on meds   Pulmonary hypertension (HCC)    a. 04/2023 Echo: RVSP 44.61mmHg.   Seasonal allergies    SSS (sick sinus syndrome) (HCC)    a. 2008 s/p MDT PPM;  b. 11/2013 Gen change- MDT ADDRL1 Adapta DC PPM, ser # [id number] H.   Symptomatic PVCs    Syncope and collapse    a. Felt to be vasovagal.   Thyroid disease    nodules on thyroid, with goiter, on no meds per pt   Tricuspid regurgitation    Type II diabetes mellitus (HCC)    Type 2   Unspecified glaucoma(365.9)    Vertigo    in past    Wears dentures    upper full and lower partial    Past Surgical History: Past Surgical History:  Procedure Laterality Date   APPENDECTOMY     CARDIAC CATHETERIZATION     CARDIOVERSION N/A 04/26/2017   Procedure: Cardioversion;  Surgeon: Antonieta Iba, MD;  Location: [facility] ORS;  Service: Cardiovascular;  Laterality: N/A;  CARDIOVERSION N/A 10/17/2023   Procedure: CARDIOVERSION;  Surgeon: Antonieta Iba, MD;  Location: [facility] ORS;  Service: Cardiovascular;  Laterality: N/A;   CATARACT EXTRACTION W/PHACO Left 07/21/2020   Procedure: CATARACT EXTRACTION PHACO AND INTRAOCULAR LENS PLACEMENT (IOC) LEFT DIABETIC;  Surgeon: Lockie Mola, MD;  Location: [facility];  Service: Ophthalmology;  Laterality: Left;  10.25 1:13.2 14.0%   CATARACT EXTRACTION W/PHACO Right 08/11/2020   Procedure: CATARACT EXTRACTION PHACO AND INTRAOCULAR LENS PLACEMENT (IOC) RIGHT DIABETIC;  Surgeon: Lockie Mola, MD;  Location: [facility];  Service: Ophthalmology;  Laterality: Right;  11.63 1:25.0 13.7%   DILATION AND CURETTAGE OF UTERUS     INCISION AND DRAINAGE OF WOUND Right 03/1998   "leg; it was like a boil" (12/01/2013)   INSERT / REPLACE / REMOVE PACEMAKER  2008; 12/01/2013   MDT ADDRL1 pacemaker - gen change by Dr Graciela Husbands 12/01/2013   LUNG BIOPSY  2010   [place]   PERMANENT PACEMAKER GENERATOR CHANGE N/A 12/01/2013   Procedure: PERMANENT PACEMAKER GENERATOR CHANGE;  Surgeon: Duke Salvia, MD;  Location: [facility];  Service: Cardiovascular;  Laterality: N/A;   VAGINAL HYSTERECTOMY  1970    Family History: Family History  Problem Relation Age of Onset   Cancer Mother    Liver cancer Mother    Pancreatic cancer Mother    Heart disease Father    Rheum arthritis Father    Allergies Father    Hypertension Sister    Rheum arthritis Sister    COPD Brother    Breast cancer Maternal Aunt     Social History: Social History   Socioeconomic History   Marital status:  Widowed    Spouse name: Not on file   Number of children: Y   Years of education: Not on file   Highest education level: Not on file  Occupational History   Occupation: retired  Tobacco Use   Smoking status: Former    Current packs/day: 0.00    Average packs/day: 0.5 packs/day for 34.0 years (17.0 ttl pk-yrs)    Types: Cigarettes    Start date: 08/16/1955    Quit date: 08/15/1989    Years since quitting: 34.2   Smokeless tobacco: Never  Vaping Use   Vaping status: Never Used  Substance and Sexual Activity   Alcohol use: No    Alcohol/week: 0.0 standard drinks of alcohol   Drug use: No   Sexual activity: Never  Other Topics Concern   Not on file  Social History Narrative   Retired. Widowed. Regularly exercises.    Social Determinants of Health   Financial Resource Strain: Unknown (12/01/2018)   Overall Financial Resource Strain (CARDIA)    Difficulty of Paying Living Expenses: Patient declined  Food Insecurity: Patient Declined (10/19/2023)   Hunger Vital Sign    Worried About Running Out of Food in the Last Year: Patient declined    Ran Out of Food in the Last Year: Patient declined  Transportation Needs: Patient Declined (10/19/2023)   PRAPARE - Administrator, Civil Service (Medical): Patient declined    Lack of Transportation (Non-Medical): Patient declined  Physical Activity: Unknown (12/01/2018)   Exercise Vital Sign    Days of Exercise per Week: Patient declined    Minutes of Exercise per Session: Patient declined  Stress: Unknown (12/01/2018)   [employer] of Occupational Health - Occupational Stress Questionnaire    Feeling of Stress : Patient declined  Social Connections: Unknown (12/01/2018)   Social Connection and  Isolation Panel [NHANES]    Frequency of Communication with Friends and Family: Patient declined    Frequency of Social Gatherings with Friends and Family: Patient declined    Attends Religious Services: Patient declined     Database administrator or Organizations: Patient declined    Attends Banker Meetings: Patient declined    Marital Status: Patient declined    Allergies:  Allergies  Allergen Reactions   Metoprolol Other (See Comments)    Bad dreams Patient stated that she had suicidal thoughts while taking this medication.    Diltiazem Other (See Comments)    Patient stated that she had bad dreams with this medication   Levofloxacin Nausea And Vomiting and Nausea Only         Objective:    Vital Signs:   Temp:  [98.1 F (36.7 C)-98.6 F (37 C)] 98.4 F (36.9 C) (11/18 0747) Pulse Rate:  [58-72] 66 (11/18 0747) Resp:  [15-20] 15 (11/18 0747) BP: (97-122)/(39-83) 122/79 (11/18 0747) SpO2:  [93 %-98 %] 98 % (11/18 0747) Last BM Date : 10/19/23  Weight change: Filed Weights   10/19/23 0045 10/21/23 0755  Weight: 108.6 kg 110.5 kg    Intake/Output:   Intake/Output Summary (Last 24 hours) at 10/22/2023 0911 Last data filed at 10/21/2023 2119 Gross per 24 hour  Intake 240 ml  Output --  Net 240 ml      Physical Exam    General: Sitting in the chair.  No resp difficulty HEENT: normal Neck: supple. JVP does not appear elevated.  Carotids 2+ bilat; no bruits. No lymphadenopathy or thyromegaly appreciated. Cor: PMI nondisplaced. Regular rate & rhythm. No rubs, gallops or murmurs. Lungs: clear Abdomen: soft, nontender, nondistended. No hepatosplenomegaly. No bruits or masses. Good bowel sounds. Extremities: no cyanosis, clubbing, rash, trace lower extremity edema Neuro: alert & orientedx3, cranial nerves grossly intact. moves all 4 extremities w/o difficulty. Affect pleasant   Telemetry  AV paced 50-60s    EKG   AV paced 64 bpm    Labs   Basic Metabolic Panel: Recent Labs  Lab 10/19/23 0048 10/19/23 0530 10/20/23 0516 10/21/23 0330 10/22/23 0518  NA 137 138 140 137 137  K 3.6 4.1 3.9 3.6 3.4*  CL 102 101 101 101 100  CO2 25 26 27 25 24   GLUCOSE 219*  377* 186* 160* 159*  BUN 33* 34* 39* 46* 52*  CREATININE 2.42* 2.59* 2.85* 2.97* 2.96*  CALCIUM 9.1 9.1 9.1 9.0 9.1    Liver Function Tests: Recent Labs  Lab 10/19/23 0048  AST 56*  ALT 45*  ALKPHOS 84  BILITOT 0.8  PROT 6.8  ALBUMIN 3.8   No results for input(s): "LIPASE", "AMYLASE" in the last 168 hours. No results for input(s): "AMMONIA" in the last 168 hours.  CBC: Recent Labs  Lab 10/16/23 1059 10/19/23 0048 10/19/23 0530 10/22/23 0518  WBC 5.4 7.4 9.9 6.6  NEUTROABS  --  5.4  --   --   HGB 11.0* 11.4* 11.3* 9.5*  HCT 32.6* 33.7* 34.6* 27.2*  MCV 80.1 79.7* 81.2 76.8*  PLT 138* 140* 157 127*    Cardiac Enzymes: No results for input(s): "CKTOTAL", "CKMB", "CKMBINDEX", "TROPONINI" in the last 168 hours.  BNP: BNP (last 3 results) Recent Labs    03/28/23 1530 10/19/23 0048  BNP 345.0* 577.9*    ProBNP (last 3 results) No results for input(s): "PROBNP" in the last 8760 hours.   CBG: Recent Labs  Lab 10/21/23 [phone]  10/21/23 1141 10/21/23 1711 10/21/23 2041 10/22/23 0751  GLUCAP 163* 220* 154* 210* 176*    Coagulation Studies: Recent Labs    10/20/23 0516 10/21/23 0330 10/22/23 0518  LABPROT 32.4* 33.9* 27.7*  INR 3.1* 3.3* 2.6*     Imaging   No results found.   Medications:     Current Medications:  amiodarone  200 mg Oral Daily   amLODipine  5 mg Oral Daily   atorvastatin  20 mg Oral Daily   calcitRIOL  0.25 mcg Oral Daily   carvedilol  25 mg Oral Daily   carvedilol  50 mg Oral QHS   ferrous sulfate  325 mg Oral Daily   guaiFENesin  600 mg Oral BID   insulin aspart  0-5 Units Subcutaneous QHS   insulin aspart  0-9 Units Subcutaneous TID WC   linagliptin  5 mg Oral Daily   multivitamin with minerals  1 tablet Oral Daily   pantoprazole  40 mg Oral Daily   potassium chloride  10 mEq Oral Daily    Infusions:     Patient Profile   [name] [name] is a 88 year old with a history fo PAF, SSS  MDT PPM, HTN, OSA, HLD, DMII, CKD  Stage IV, PVCs, and HFpEF.   Ongoing dyspnea for the last 8 months. Admitted with A/C HFpEF and possible pneumonia.   Assessment/Plan   1. A/C HFpEF Echo preserved EF. BNP 577. Diuresed with IV lasix and developed AKI.  On exam she does not appear volume overloaded. Hold diuretics.  Will need RHC to further assess volume status/hemodynamics. Possible RHC today versus tomorrow depending on cath lab availability.   2. CKD Stage IV Creatinine baseline ~ 2.6 Up to 3 today. Hold diuretics.  Will need RHC for assess hemodynamics.     3. A flutter  S/P Cardioversion 10/17/23  On bb + amiodarone.   On Coumadin.   4.  Pulmonary Hypertension  RVSP 60. Know OSA. Will need to RHC to further assess hemodynamics.    5. OSA - Using CPAP   Length of Stay: 3  Other Atienza, NP  10/22/2023, 9:11 AM  Advanced Heart Failure Team Pager [phone] (M-F; 7a - 5p)  Please contact [facility] for night-coverage after hours (4p -7a ) and weekends on [email or web address]

## 2023-10-22 NOTE — Evaluation (Signed)
Physical Therapy Evaluation Patient Details Name: [name] [name] MRN: [id number] DOB: 1935/03/08 Today's Date: 10/22/2023  History of Present Illness  presented to ER secondary to SOB, midsternal chest pain; admitted for management of acute hypoxic respiratory failure related to multi-focal PNA, CHF  Clinical Impression  Patient seated in recliner upon arrival to room; son visiting at bedside. Patient alert and oriented, follows commands and agreeable to participation with treatment session. At baseline, patient lives alone in senior-apartment; single-story with level entry.  Indep with ADLs, household and limited-community mobilization; intermittent use of 4WRW vs SPC as needed.  Denies fall history; no home O2. Currently, patient demonstrates strength and ROM grossly symmetrical and WFL for basic transfers and mobility; no focal weakness appreciated.  Able to complete sit/stand, basic transfers, standing balance and gait (90') with RW, supervision.  Demonstrates reciprocal stepping pattern with good step height/length; slow, but steady, cadence without buckling or LOB. Significant SOB with exertion (BORG 8-9/10); sats 92-93%, HR 60-70s with gait trials on RA.  Do recommend continued use of RW for energy conservation with mobility tasks; patient voices understanding and agreement. Would benefit from skilled PT to address above deficits and promote optimal return to PLOF.; recommend post-acute PT follow up as indicated by interdisciplinary care team.            If plan is discharge home, recommend the following: A little help with walking and/or transfers;A little help with bathing/dressing/bathroom   Can travel by private vehicle        Equipment Recommendations  (has 4WRW, [facility])  Recommendations for Other Services       Functional Status Assessment Patient has had a recent decline in their functional status and demonstrates the ability to make significant improvements in function in a  reasonable and predictable amount of time.     Precautions / Restrictions Precautions Precautions: None Restrictions Weight Bearing Restrictions: No      Mobility  Bed Mobility Overal bed mobility: Modified Independent                  Transfers Overall transfer level: Modified independent   Transfers: Sit to/from Stand Sit to Stand: Supervision, Modified independent (Device/Increase time)                Ambulation/Gait Ambulation/Gait assistance: Supervision Gait Distance (Feet): 90 Feet Assistive device: Rolling walker (2 wheels)         General Gait Details: reciprocal stepping pattern with good step height/length; slow, but steady, cadence without buckling or LOB. Significant SOB with exertion (BORG 8-9/10); sats 92-93%, HR 60-70s with gait trials on RA  Stairs            Wheelchair Mobility     Tilt Bed    Modified Rankin (Stroke Patients Only)       Balance Overall balance assessment: Needs assistance Sitting-balance support: No upper extremity supported, Feet supported Sitting balance-Leahy Scale: Normal     Standing balance support: No upper extremity supported, Bilateral upper extremity supported Standing balance-Leahy Scale: Good                               Pertinent Vitals/Pain Pain Assessment Pain Assessment: No/denies pain    Home Living Family/patient expects to be discharged to:: Private residence Living Arrangements: Alone Available Help at Discharge: Family;Available PRN/intermittently   Home Access: Level entry       Home Layout: One level Home Equipment: Rollator (4 wheels);Cane -  single point      Prior Function               Mobility Comments: Mod indep for ADLs, household and limited-community mobilization; intermittent use of 4WRW inside home, SPC outside of home.  Denies fall history; no home O2       Extremity/Trunk Assessment   Upper Extremity Assessment Upper Extremity  Assessment: Overall WFL for tasks assessed    Lower Extremity Assessment Lower Extremity Assessment: Overall WFL for tasks assessed (grossly at least 4/5 throughout)       Communication      Cognition Arousal: Alert Behavior During Therapy: [facility] for tasks assessed/performed Overall Cognitive Status: Within Functional Limits for tasks assessed                                          General Comments      Exercises Other Exercises Other Exercises: Educated in role of activity pacing and energy conservation, signs/symptoms of fatigue, home safety modifications; patient voiced understanding of all information.   Assessment/Plan    PT Assessment Patient needs continued PT services  PT Problem List Decreased activity tolerance;Decreased mobility;Decreased coordination;Decreased balance;Decreased safety awareness;Decreased knowledge of precautions;Cardiopulmonary status limiting activity       PT Treatment Interventions DME instruction;Gait training;Stair training;Functional mobility training;Therapeutic activities;Therapeutic exercise;Balance training;Patient/family education    PT Goals (Current goals can be found in the Care Plan section)  Acute Rehab PT Goals Patient Stated Goal: to return home PT Goal Formulation: With patient/family Time For Goal Achievement: 11/05/23 Potential to Achieve Goals: Good    Frequency Min 1X/week     Co-evaluation               AM-PAC PT "6 Clicks" Mobility  Outcome Measure Help needed turning from your back to your side while in a flat bed without using bedrails?: None Help needed moving from lying on your back to sitting on the side of a flat bed without using bedrails?: None Help needed moving to and from a bed to a chair (including a wheelchair)?: None Help needed standing up from a chair using your arms (e.g., wheelchair or bedside chair)?: None Help needed to walk in hospital room?: A Little Help needed  climbing 3-5 steps with a railing? : A Little 6 Click Score: 22    End of Session   Activity Tolerance: Patient tolerated treatment well Patient left: in chair;with call bell/phone within reach;with family/visitor present Nurse Communication: Mobility status PT Visit Diagnosis: Muscle weakness (generalized) (M62.81);Difficulty in walking, not elsewhere classified (R26.2)    Time: 1351-1410 PT Time Calculation (min) (ACUTE ONLY): 19 min   Charges:   PT Evaluation $PT Eval Moderate Complexity: 1 Mod   PT General Charges $$ ACUTE PT VISIT: 1 Visit         Clydell Alberts H. Manson Passey, PT, DPT, NCS 10/22/23, 2:20 PM [phone]

## 2023-10-22 NOTE — Consult Note (Signed)
Pharmacy Consult Note - Anticoagulation  Pharmacy Consult for warfarin (continuation of home therapy) Indication: Atrial fibrillation  PATIENT MEASUREMENTS: Height: 5\' 4"  (162.6 cm) Weight: 110.5 kg (243 lb 8 oz) IBW/kg (Calculated) : 54.7 HEPARIN DW (KG): 80.4  VITAL SIGNS: Temp: 98.4 F (36.9 C) (11/18 0747) Temp Source: Oral (11/17 2345) BP: 122/79 (11/18 0747) Pulse Rate: 66 (11/18 0747)  Recent Labs    10/22/23 0518  HGB 9.5*  HCT 27.2*  PLT 127*  LABPROT 27.7*  INR 2.6*  CREATININE 2.96*    Estimated Creatinine Clearance: 16.9 mL/min (A) (by C-G formula based on SCr of 2.96 mg/dL (H)).  PAST MEDICAL HISTORY: Past Medical History:  Diagnosis Date   1st degree AV block    a. PR 400 msec with Apacing   A-fib (HCC)    Hx of   Anemia    Anxiety    Arthritis    knees and lower back    Chest pain    a. 2004 Cath:  reportedly nl;  b. 09/2013 Neg MV; c. 11/2017 nl ETT; d. 08/2023 MV: No isch/infarct. nl LV fxn. Mild Ao Ca2+.   Chronic diastolic heart failure (HCC)    a. 09/2013 Echo: EF 50-55%, mild LVH, mild to mod MR/TR; b. 04/2023 Echo: EF 55-60%, no rwma, nl RV size/fxn, RVSP 44.52mmHg. Mod dil LA. Mod MR/TR.   CKD (chronic kidney disease), stage IV (HCC)    Colon polyps    Congestive heart disease (HCC)    Esophageal reflux    Glaucoma    Gout    H/O hiatal hernia    History of UTI    Hyperlipidemia    Moderate mitral regurgitation    a. 04/2023 Echo: Mod MR.   Morbid obesity (HCC)    OSA (obstructive sleep apnea)    OSA on CPAP    CPAP   Pacemaker    copy of medtronic card in chart   PAF (paroxysmal atrial fibrillation) (HCC)    a. 10/2023 s/p DCCV (150J); b. CHA2DS2VASc = 5-->warfarin.   Primary hypertension    controlled on meds   Pulmonary hypertension (HCC)    a. 04/2023 Echo: RVSP 44.69mmHg.   Seasonal allergies    SSS (sick sinus syndrome) (HCC)    a. 2008 s/p MDT PPM;  b. 11/2013 Gen change- MDT ADDRL1 Adapta DC PPM, ser # [id number] H.    Symptomatic PVCs    Syncope and collapse    a. Felt to be vasovagal.   Thyroid disease    nodules on thyroid, with goiter, on no meds per pt   Tricuspid regurgitation    Type II diabetes mellitus (HCC)    Type 2   Unspecified glaucoma(365.9)    Vertigo    in past   Wears dentures    upper full and lower partial    ASSESSMENT: 88 y.o. female with PMH including Afib is presenting with pneumonia. Patient takes warfarin 2 mg PO daily, with her most recent dose prior to admission being on 11/14. Patient has persistent atrial flutter waiting cardiac recs given not a good candidate for ablatio and failed amiodarone.   Pertinent medications: Warfarin 2 mg PO daily Last dose before admission: 11/14 Total weekly warfarin dose: 14 mg INR goal 2-3  Drug-Drug Interactions: Amiodarone(on both PTA), azithromycin x1 11/15  Goal(s) of therapy: INR 2 - 3  Baseline anticoagulation labs: Recent Labs    10/20/23 0516 10/21/23 0330 10/22/23 0518  INR 3.1* 3.3* 2.6*  HGB  --   --  9.5*  PLT  --   --  127*    Date INR Dose  11/15 2.3 2 mg  11/16 3.1 1 mg  11/17 3.3 -  11/18  2.6 -   PLAN: INR 2.6 Cardiology is now transitioning to Heparin drip (to start when INR < 2.0) for possible cardiac cath Re-check INR with AM labs Follow cardiac recommendation for possible cardiac cath  Effie Shy, PharmD Pharmacy Resident  10/22/2023 10:14 AM

## 2023-10-22 NOTE — Progress Notes (Signed)
PROGRESS NOTE    [name] [name]   SWN:[id number] DOB: 1934-12-17  DOA: 10/19/2023 Date of Service: 10/22/23 which is hospital day 3  PCP: Dale Dublin, MD    HPI: [name] [name] ia a 88 y.o. female w/ PMH atrial fibrillation on Coumadin, stage IV chronic kidney disease, GERD, CHF, anxiety, SSS status post pacemaker placement, and type 2 diabetes mellitus who presented 10/19/23 to ED via EMS from home with worsening shortness of breath <1 day and chest pain. Underwent cardioversion 11/13 w/ Dr Mariah Milling for Aflutter associated w/ dyspnea, converted NSR, discharged. Patient was attempting to go to sleep 11/14 and wearing her CPAP when she could not tolerate lying flat to breathe.   Hospital course / significant events:  11/15: to ED, admitted to hospitalist service for SOB w/ new O2 requirement likely d/t CHF/CAP. Started diuresis and abx. D/c abx, did not feel strong clinical suspicion for CAP. Echo ordered.  11/16: Cardiology to see patient today, diuresed well and rales improved, Cr worse will hold diuretics for now pending cardiology eval. Echo EF 55-60, normal LV fxn and no RWMA, indeterminate diastolic, severely elevated pulmonary artery pressure,  11/17: back into aflutter overnight. Cardiology planning possible EP eval / HF team eval. Still significant DOE.   Consultants:  none   Procedures/Surgeries: none   ASSESSMENT & PLAN:   Dyspnea d/t Acute CHF (congestive heart failure) HFpEF, pulmonary edema, Pulmonary hypertension - severe  Acute hypoxic respiratory failure d/t above  Family state she has been drinking more water recently at the advice of her doctors to help her kidney function. Normal LV function on TTE Diuresis - caution w/ CKD --> held for now w/ AKI continue Januvia and Coreg Strict I&O, daily weight, fluid restriction  Supplemental O2 as needed - she uses CPAP at hoem but not on chronic O2  Cardiology following may need to consider right heart catheterization,  advanced heart failure eval pending PT consult  Persistent atrial flutter Status post cardioversion 11/13. Maintaining sinus rhythm-AV paced with seemingly appropriate rate response with ambulation however back into Aflutter overnight 11/17 anticoagulated on warfarin  Continue amiodarone and beta-blocker.   Multifocal pneumonia ruled out CXR concerning for pneumonia but given no sepsis, no fever, no significant infectious respiratory symptoms such as cough, and w/ reasonable etiology for SOB being dx CHF as above  dc abx  Follow blood cultures --> NGTD    AKI on CKD4 Concern for cardiorenal syndrome / diuresis effect  Caution w/ diuresis --> holding for now Renal u/s Reviewed w/ dr. Cherylann Ratel today, advises continued monitoring, formal nephrology involvement if kidney function continues to decline  Anemia of ckd Hgb has trended to 9.5 today from baseline of around 11, no report of bleeding - monitor for now  OSA CPAP at bedtime   Essential hypertension continue antihypertensive therapy amlodipine, carvedilol  Cardiology following    Dyslipidemia Continue home statin therapy.   obese based on BMI: Body mass index is 41.1 kg/m.      DVT prophylaxis: coumadin IV fluids: no continuous IV fluids  Nutrition: cardiac/carb Central lines / invasive devices: none  Code Status: FULL CODE ACP documentation reviewed: none on file in [facility]  TOC needs: TBD Barriers to dispo / significant pending items: cardiology workup Family: nephew updated @ bedside 11/18          Subjective / Brief ROS:  Patient reports still significant SOB on exertion but doing ok at rest\ Denies CP Pain controlled.  Denies new weakness.  Tolerating diet.  Reports no concerns w/ urination/defecation.   Family Communication: family at bedside on rounds      Objective Findings:  Vitals:   10/21/23 2345 10/21/23 2347 10/22/23 0333 10/22/23 0747  BP:  (!) 100/54 (!) 105/48 122/79  Pulse:  (!) 59  (!) 59 66  Resp:   18 15  Temp: 98.3 F (36.8 C)   98.4 F (36.9 C)  TempSrc: Oral     SpO2:  94% 93% 98%  Weight:      Height:        Intake/Output Summary (Last 24 hours) at 10/22/2023 1045 Last data filed at 10/21/2023 2119 Gross per 24 hour  Intake 240 ml  Output --  Net 240 ml    Filed Weights   10/19/23 0045 10/21/23 0755  Weight: 108.6 kg 110.5 kg    Physical Exam Constitutional:      General: She is not in acute distress.    Appearance: She is obese. She is not toxic-appearing.  Cardiovascular:     Rate and Rhythm: Normal rate and regular rhythm.  Pulmonary:     Effort: Pulmonary effort is normal.     Breath sounds: Examination of the right-lower field reveals rales. Examination of the left-lower field reveals rales. Rales present.  Musculoskeletal:     Right lower leg: Edema (trace) present.     Left lower leg: Edema (trace) present.  Skin:    General: Skin is warm and dry.  Neurological:     General: No focal deficit present.     Mental Status: She is alert and oriented to person, place, and time.  Psychiatric:        Mood and Affect: Mood normal.        Behavior: Behavior normal.          Scheduled Medications:   amiodarone  200 mg Oral Daily   amLODipine  5 mg Oral Daily   atorvastatin  20 mg Oral Daily   calcitRIOL  0.25 mcg Oral Daily   carvedilol  25 mg Oral Daily   carvedilol  50 mg Oral QHS   ferrous sulfate  325 mg Oral Daily   guaiFENesin  600 mg Oral BID   insulin aspart  0-5 Units Subcutaneous QHS   insulin aspart  0-9 Units Subcutaneous TID WC   linagliptin  5 mg Oral Daily   multivitamin with minerals  1 tablet Oral Daily   pantoprazole  40 mg Oral Daily   potassium chloride  10 mEq Oral Daily    Continuous Infusions:   PRN Medications:  acetaminophen **OR** acetaminophen, chlorpheniramine-HYDROcodone, Glycerin (Adult), ipratropium-albuterol, magnesium hydroxide, meclizine, ondansetron **OR** ondansetron (ZOFRAN)  IV, polyethylene glycol, traZODone  Antimicrobials from admission:  Anti-infectives (From admission, onward)    Start     Dose/Rate Route Frequency Ordered Stop   10/20/23 0000  cefTRIAXone (ROCEPHIN) 2 g in sodium chloride 0.9 % 100 mL IVPB  Status:  Discontinued        2 g 200 mL/hr over 30 Minutes Intravenous Every 24 hours 10/19/23 0404 10/19/23 1525   10/20/23 0000  azithromycin (ZITHROMAX) 500 mg in dextrose 5 % 250 mL IVPB  Status:  Discontinued        500 mg 250 mL/hr over 60 Minutes Intravenous Every 24 hours 10/19/23 0404 10/19/23 1525   10/19/23 0445  cefTRIAXone (ROCEPHIN) 1 g in sodium chloride 0.9 % 100 mL IVPB        1 g 200 mL/hr over 30  Minutes Intravenous  Once 10/19/23 0441 10/19/23 0606   10/19/23 0200  cefTRIAXone (ROCEPHIN) 1 g in sodium chloride 0.9 % 100 mL IVPB        1 g 200 mL/hr over 30 Minutes Intravenous  Once 10/19/23 0147 10/19/23 0253   10/19/23 0200  azithromycin (ZITHROMAX) 500 mg in dextrose 5 % 250 mL IVPB        500 mg 250 mL/hr over 60 Minutes Intravenous  Once 10/19/23 0147 10/19/23 0335           Data Reviewed:  I have personally reviewed the following...  CBC: Recent Labs  Lab 10/16/23 1059 10/19/23 0048 10/19/23 0530 10/22/23 0518  WBC 5.4 7.4 9.9 6.6  NEUTROABS  --  5.4  --   --   HGB 11.0* 11.4* 11.3* 9.5*  HCT 32.6* 33.7* 34.6* 27.2*  MCV 80.1 79.7* 81.2 76.8*  PLT 138* 140* 157 127*   Basic Metabolic Panel: Recent Labs  Lab 10/19/23 0048 10/19/23 0530 10/20/23 0516 10/21/23 0330 10/22/23 0518  NA 137 138 140 137 137  K 3.6 4.1 3.9 3.6 3.4*  CL 102 101 101 101 100  CO2 25 26 27 25 24   GLUCOSE 219* 377* 186* 160* 159*  BUN 33* 34* 39* 46* 52*  CREATININE 2.42* 2.59* 2.85* 2.97* 2.96*  CALCIUM 9.1 9.1 9.1 9.0 9.1   GFR: Estimated Creatinine Clearance: 16.9 mL/min (A) (by C-G formula based on SCr of 2.96 mg/dL (H)). Liver Function Tests: Recent Labs  Lab 10/19/23 0048  AST 56*  ALT 45*  ALKPHOS 84   BILITOT 0.8  PROT 6.8  ALBUMIN 3.8   No results for input(s): "LIPASE", "AMYLASE" in the last 168 hours. No results for input(s): "AMMONIA" in the last 168 hours. Coagulation Profile: Recent Labs  Lab 10/16/23 1059 10/19/23 0223 10/20/23 0516 10/21/23 0330 10/22/23 0518  INR 2.2* 2.3* 3.1* 3.3* 2.6*   Cardiac Enzymes: No results for input(s): "CKTOTAL", "CKMB", "CKMBINDEX", "TROPONINI" in the last 168 hours. BNP (last 3 results) No results for input(s): "PROBNP" in the last 8760 hours. HbA1C: No results for input(s): "HGBA1C" in the last 72 hours. CBG: Recent Labs  Lab 10/21/23 0806 10/21/23 1141 10/21/23 1711 10/21/23 2041 10/22/23 0751  GLUCAP 163* 220* 154* 210* 176*   Lipid Profile: No results for input(s): "CHOL", "HDL", "LDLCALC", "TRIG", "CHOLHDL", "LDLDIRECT" in the last 72 hours. Thyroid Function Tests: No results for input(s): "TSH", "T4TOTAL", "FREET4", "T3FREE", "THYROIDAB" in the last 72 hours. Anemia Panel: No results for input(s): "VITAMINB12", "FOLATE", "FERRITIN", "TIBC", "IRON", "RETICCTPCT" in the last 72 hours. Most Recent Urinalysis On File:     Component Value Date/Time   COLORURINE STRAW (A) 12/01/2018 0828   APPEARANCEUR CLEAR (A) 12/01/2018 0828   APPEARANCEUR Clear 08/03/2014 1622   LABSPEC 1.006 12/01/2018 0828   LABSPEC 1.004 08/03/2014 1622   PHURINE 7.0 12/01/2018 0828   GLUCOSEU NEGATIVE 12/01/2018 0828   GLUCOSEU Negative 08/03/2014 1622   HGBUR NEGATIVE 12/01/2018 0828   BILIRUBINUR NEGATIVE 12/01/2018 0828   BILIRUBINUR Negative 08/03/2014 1622   KETONESUR NEGATIVE 12/01/2018 0828   PROTEINUR NEGATIVE 12/01/2018 0828   NITRITE NEGATIVE 12/01/2018 0828   LEUKOCYTESUR NEGATIVE 12/01/2018 0828   LEUKOCYTESUR Negative 08/03/2014 1622   Sepsis Labs: @LABRCNTIP (procalcitonin:4,lacticidven:4) Microbiology: Recent Results (from the past 240 hour(s))  Resp panel by RT-PCR (RSV, Flu A&B, Covid) Anterior Nasal Swab     Status:  None   Collection Time: 10/19/23 12:59 AM   Specimen: Anterior Nasal Swab  Result  Value Ref Range Status   SARS Coronavirus 2 by RT PCR NEGATIVE NEGATIVE Final    Comment: (NOTE) SARS-CoV-2 target nucleic acids are NOT DETECTED.  The SARS-CoV-2 RNA is generally detectable in upper respiratory specimens during the acute phase of infection. The lowest concentration of SARS-CoV-2 viral copies this assay can detect is 138 copies/mL. A negative result does not preclude SARS-Cov-2 infection and should not be used as the sole basis for treatment or other patient management decisions. A negative result may occur with  improper specimen collection/handling, submission of specimen other than nasopharyngeal swab, presence of viral mutation(s) within the areas targeted by this assay, and inadequate number of viral copies(<138 copies/mL). A negative result must be combined with clinical observations, patient history, and epidemiological information. The expected result is Negative.  Fact Sheet for Patients:  BloggerCourse.com  Fact Sheet for Healthcare Providers:  SeriousBroker.it  This test is no t yet approved or cleared by the [place] FDA and  has been authorized for detection and/or diagnosis of SARS-CoV-2 by FDA under an Emergency Use Authorization (EUA). This EUA will remain  in effect (meaning this test can be used) for the duration of the COVID-19 declaration under Section 564(b)(1) of the Act, 21 U.S.C.section 360bbb-3(b)(1), unless the authorization is terminated  or revoked sooner.       Influenza A by PCR NEGATIVE NEGATIVE Final   Influenza B by PCR NEGATIVE NEGATIVE Final    Comment: (NOTE) The Xpert Xpress SARS-CoV-2/FLU/RSV plus assay is intended as an aid in the diagnosis of influenza from Nasopharyngeal swab specimens and should not be used as a sole basis for treatment. Nasal washings and aspirates are unacceptable  for Xpert Xpress SARS-CoV-2/FLU/RSV testing.  Fact Sheet for Patients: BloggerCourse.com  Fact Sheet for Healthcare Providers: SeriousBroker.it  This test is not yet approved or cleared by the [place] FDA and has been authorized for detection and/or diagnosis of SARS-CoV-2 by FDA under an Emergency Use Authorization (EUA). This EUA will remain in effect (meaning this test can be used) for the duration of the COVID-19 declaration under Section 564(b)(1) of the Act, 21 U.S.C. section 360bbb-3(b)(1), unless the authorization is terminated or revoked.     Resp Syncytial Virus by PCR NEGATIVE NEGATIVE Final    Comment: (NOTE) Fact Sheet for Patients: BloggerCourse.com  Fact Sheet for Healthcare Providers: SeriousBroker.it  This test is not yet approved or cleared by the [place] FDA and has been authorized for detection and/or diagnosis of SARS-CoV-2 by FDA under an Emergency Use Authorization (EUA). This EUA will remain in effect (meaning this test can be used) for the duration of the COVID-19 declaration under Section 564(b)(1) of the Act, 21 U.S.C. section 360bbb-3(b)(1), unless the authorization is terminated or revoked.  Performed at [facility], [address] [place], [place] [address]   Culture, blood (routine x 2)     Status: None (Preliminary result)   Collection Time: 10/19/23  2:23 AM   Specimen: BLOOD  Result Value Ref Range Status   Specimen Description BLOOD BLOOD RIGHT ARM  Final   Special Requests   Final    BOTTLES DRAWN AEROBIC AND ANAEROBIC Blood Culture adequate volume   Culture   Final    NO GROWTH 3 DAYS Performed at [facility], [address] [place], [place] [address]    Report Status PENDING  Incomplete  Culture, blood (routine x 2)     Status: None (Preliminary result)   Collection Time: 10/19/23  2:36  AM    Specimen: BLOOD  Result Value Ref Range Status   Specimen Description BLOOD BLOOD LEFT ARM  Final   Special Requests   Final    BOTTLES DRAWN AEROBIC AND ANAEROBIC Blood Culture adequate volume   Culture   Final    NO GROWTH 3 DAYS Performed at [facility], [address], [place], [place] [address]    Report Status PENDING  Incomplete      Radiology Studies last 3 days: ECHOCARDIOGRAM COMPLETE  Result Date: 10/20/2023    ECHOCARDIOGRAM REPORT   Patient Name:   LENISHA KOBES Date of Exam: 10/20/2023 Medical Rec #:  [id number]    Height:       64.0 in Accession #:    [id number]   Weight:       239.4 lb Date of Birth:  06/12/1935   BSA:          2.112 m Patient Age:    87 years     BP:           130/64 mmHg Patient Gender: F            HR:           60 bpm. Exam Location:  [facility] Procedure: 2D Echo Indications:     CHF I50.31  History:         Patient has prior history of Echocardiogram examinations, most                  recent 04/25/2023.  Sonographer:     Overton Mam RDCS, FASE Referring Phys:  [id number] Sunnie Nielsen Diagnosing Phys: [name] MD IMPRESSIONS  1. Left ventricular ejection fraction, by estimation, is 55 to 60%. The left ventricle has normal function. The left ventricle has no regional wall motion abnormalities. Left ventricular diastolic parameters are indeterminate.  2. Right ventricular systolic function is low normal. The right ventricular size is mildly enlarged. There is severely elevated pulmonary artery systolic pressure. The estimated right ventricular systolic pressure is 62.8 mmHg.  3. Left atrial size was mildly dilated.  4. The mitral valve is normal in structure. No evidence of mitral valve regurgitation. No evidence of mitral stenosis.  5. Tricuspid valve regurgitation is moderate.  6. The aortic valve is normal in structure. Aortic valve regurgitation is not visualized. No aortic stenosis is present.  7. The inferior vena cava is normal in size with  greater than 50% respiratory variability, suggesting right atrial pressure of 3 mmHg. FINDINGS  Left Ventricle: Left ventricular ejection fraction, by estimation, is 55 to 60%. The left ventricle has normal function. The left ventricle has no regional wall motion abnormalities. The left ventricular internal cavity size was normal in size. There is  no left ventricular hypertrophy. Left ventricular diastolic parameters are indeterminate. Right Ventricle: The right ventricular size is mildly enlarged. No increase in right ventricular wall thickness. Right ventricular systolic function is low normal. There is severely elevated pulmonary artery systolic pressure. The tricuspid regurgitant velocity is 3.80 m/s, and with an assumed right atrial pressure of 5 mmHg, the estimated right ventricular systolic pressure is 62.8 mmHg. Left Atrium: Left atrial size was mildly dilated. Right Atrium: Right atrial size was normal in size. Pericardium: There is no evidence of pericardial effusion. Mitral Valve: The mitral valve is normal in structure. No evidence of mitral valve regurgitation. No evidence of mitral valve stenosis. Tricuspid Valve: The tricuspid valve is normal in structure. Tricuspid valve regurgitation is moderate .  No evidence of tricuspid stenosis. Aortic Valve: The aortic valve is normal in structure. Aortic valve regurgitation is not visualized. No aortic stenosis is present. Aortic valve peak gradient measures 9.7 mmHg. Pulmonic Valve: The pulmonic valve was normal in structure. Pulmonic valve regurgitation is mild. No evidence of pulmonic stenosis. Aorta: The aortic root is normal in size and structure. Venous: The inferior vena cava is normal in size with greater than 50% respiratory variability, suggesting right atrial pressure of 3 mmHg. IAS/Shunts: No atrial level shunt detected by color flow Doppler. Additional Comments: A device lead is visualized.  LEFT VENTRICLE PLAX 2D LVIDd:         4.10 cm    Diastology LVIDs:         2.90 cm   LV e' medial:    13.40 cm/s LV PW:         1.30 cm   LV E/e' medial:  9.2 LV IVS:        1.00 cm   LV e' lateral:   14.10 cm/s LVOT diam:     1.80 cm   LV E/e' lateral: 8.7 LV SV:         55 LV SV Index:   26 LVOT Area:     2.54 cm  RIGHT VENTRICLE RV Basal diam:  3.00 cm RV S prime:     16.60 cm/s TAPSE (M-mode): 2.1 cm LEFT ATRIUM           Index        RIGHT ATRIUM           Index LA diam:      4.90 cm 2.32 cm/m   RA Area:     17.40 cm LA Vol (A2C): 62.0 ml 29.36 ml/m  RA Volume:   43.30 ml  20.50 ml/m LA Vol (A4C): 67.1 ml 31.77 ml/m  AORTIC VALVE                 PULMONIC VALVE AV Area (Vmax): 1.84 cm     PV Vmax:          0.96 m/s AV Vmax:        156.00 cm/s  PV Peak grad:     3.7 mmHg AV Peak Grad:   9.7 mmHg     PR End Diast Vel: 6.45 msec LVOT Vmax:      113.00 cm/s LVOT Vmean:     75.000 cm/s LVOT VTI:       0.218 m  AORTA Ao Root diam: 3.00 cm Ao Asc diam:  3.00 cm MITRAL VALVE                TRICUSPID VALVE MV Area (PHT): 3.34 cm     TR Peak grad:   57.8 mmHg MV Decel Time: 227 msec     TR Vmax:        380.00 cm/s MV E velocity: 123.00 cm/s MV A velocity: 35.10 cm/s   SHUNTS MV E/A ratio:  3.50         Systemic VTI:  0.22 m                             Systemic Diam: 1.80 cm [name] MD Electronically signed by [name] MD Signature Date/Time: 10/20/2023/3:16:40 PM    Final    DG Chest Port 1 View  Result Date: 10/19/2023 CLINICAL DATA:  Shortness of breath EXAM: PORTABLE CHEST 1 VIEW COMPARISON:  05/22/2023 FINDINGS: Faint patchy/interstitial opacities in the lungs bilaterally, suggesting mild multifocal infection/pneumonia, lower lobe predominant. No pleural effusions or pneumothorax. Cardiomegaly.  Right subclavian pacemaker. IMPRESSION: Suspected mild multifocal infection/pneumonia, lower lobe predominant. Cardiomegaly. Electronically Signed   By: Charline Bills M.D.   On: 10/19/2023 01:21        Silvano Bilis, DO [facility] 10/22/2023, 10:45 AM    Dictation software may have been used to generate the above note. Typos may occur and escape review in typed/dictated notes. Please contact Dr Lyn Hollingshead directly for clarity if needed.  Staff may message me via secure chat in Epic  but this may not receive an immediate response,  please page me for urgent matters!  If 7PM-7AM, please contact night coverage [email or web address]

## 2023-10-23 ENCOUNTER — Ambulatory Visit: Payer: PPO

## 2023-10-23 ENCOUNTER — Encounter
Admission: EM | Disposition: A | Discharge status: Home / Self Care | Payer: Self-pay | Source: Home / Self Care | Attending: Osteopathic Medicine

## 2023-10-23 ENCOUNTER — Encounter: Payer: Self-pay | Admitting: Oncology

## 2023-10-23 ENCOUNTER — Other Ambulatory Visit (HOSPITAL_COMMUNITY): Payer: Self-pay

## 2023-10-23 ENCOUNTER — Ambulatory Visit: Payer: PPO | Admitting: Pulmonary Disease

## 2023-10-23 ENCOUNTER — Telehealth (HOSPITAL_COMMUNITY): Payer: Self-pay | Admitting: Pharmacy Technician

## 2023-10-23 ENCOUNTER — Encounter: Payer: Self-pay | Admitting: Cardiology

## 2023-10-23 DIAGNOSIS — I5041 Acute combined systolic (congestive) and diastolic (congestive) heart failure: Secondary | ICD-10-CM

## 2023-10-23 DIAGNOSIS — I5032 Chronic diastolic (congestive) heart failure: Secondary | ICD-10-CM

## 2023-10-23 HISTORY — PX: RIGHT HEART CATH: CATH118263

## 2023-10-23 LAB — POCT I-STAT EG7
Acid-Base Excess: 2 mmol/L (ref 0.0–2.0)
Acid-Base Excess: 2 mmol/L (ref 0.0–2.0)
Bicarbonate: 26.9 mmol/L (ref 20.0–28.0)
Bicarbonate: 27.2 mmol/L (ref 20.0–28.0)
Calcium, Ion: 1.23 mmol/L (ref 1.15–1.40)
Calcium, Ion: 1.27 mmol/L (ref 1.15–1.40)
HCT: 28 % — ABNORMAL LOW (ref 36.0–46.0)
HCT: 28 % — ABNORMAL LOW (ref 36.0–46.0)
Hemoglobin: 9.5 g/dL — ABNORMAL LOW (ref 12.0–15.0)
Hemoglobin: 9.5 g/dL — ABNORMAL LOW (ref 12.0–15.0)
O2 Saturation: 47 %
O2 Saturation: 55 %
Potassium: 3.9 mmol/L (ref 3.5–5.1)
Potassium: 4.1 mmol/L (ref 3.5–5.1)
Sodium: 140 mmol/L (ref 135–145)
Sodium: 140 mmol/L (ref 135–145)
TCO2: 28 mmol/L (ref 22–32)
TCO2: 28 mmol/L (ref 22–32)
pCO2, Ven: 41.6 mm[Hg] — ABNORMAL LOW (ref 44–60)
pCO2, Ven: 42.1 mm[Hg] — ABNORMAL LOW (ref 44–60)
pH, Ven: 7.418 (ref 7.25–7.43)
pH, Ven: 7.419 (ref 7.25–7.43)
pO2, Ven: 25 mm[Hg] — CL (ref 32–45)
pO2, Ven: 28 mm[Hg] — CL (ref 32–45)

## 2023-10-23 LAB — BASIC METABOLIC PANEL
Anion gap: 11 (ref 5–15)
BUN: 47 mg/dL — ABNORMAL HIGH (ref 8–23)
CO2: 24 mmol/L (ref 22–32)
Calcium: 9.1 mg/dL (ref 8.9–10.3)
Chloride: 103 mmol/L (ref 98–111)
Creatinine, Ser: 2.72 mg/dL — ABNORMAL HIGH (ref 0.44–1.00)
GFR, Estimated: 17 mL/min — ABNORMAL LOW (ref >60)
Glucose, Bld: 126 mg/dL — ABNORMAL HIGH (ref 70–99)
Potassium: 3.9 mmol/L (ref 3.5–5.1)
Sodium: 138 mmol/L (ref 135–145)

## 2023-10-23 LAB — PROTIME-INR
INR: 2.2 — ABNORMAL HIGH (ref 0.8–1.2)
Prothrombin Time: 24.6 s — ABNORMAL HIGH (ref 11.4–15.2)

## 2023-10-23 LAB — GLUCOSE, CAPILLARY
Glucose-Capillary: 171 mg/dL — ABNORMAL HIGH (ref 70–99)
Glucose-Capillary: 141 mg/dL — ABNORMAL HIGH (ref 70–99)

## 2023-10-23 SURGERY — RIGHT HEART CATH
Anesthesia: Moderate Sedation

## 2023-10-23 MED ORDER — SODIUM CHLORIDE 0.9% FLUSH: 10.0000 mL | Freq: Two times a day (BID) | INTRAVENOUS | Status: DC

## 2023-10-23 MED ORDER — GLYCERIN (LAXATIVE) 2 G RE SUPP: 1.0000 | Freq: Every day | RECTAL | Status: DC | PRN

## 2023-10-23 MED ORDER — SODIUM CHLORIDE 0.9 % IV SOLN: INTRAVENOUS | Status: DC

## 2023-10-23 MED ORDER — ASPIRIN 81 MG PO CHEW
CHEWABLE_TABLET | ORAL | Status: AC
  Administered 2023-10-23: 81 mg via ORAL
  Filled 2023-10-23: qty 1

## 2023-10-23 MED ORDER — ASPIRIN 81 MG PO CHEW: 81.0000 mg | CHEWABLE_TABLET | ORAL | Status: AC

## 2023-10-23 SURGICAL SUPPLY — 11 items
CATH BALLN WEDGE 5F 110CM (CATHETERS) IMPLANT
DRAPE BRACHIAL (DRAPES) IMPLANT
GUIDEWIRE .025 260CM (WIRE) IMPLANT
KIT MICROPUNCTURE NIT STIFF (SHEATH) IMPLANT
PACK ANGIOGRAPHY (CUSTOM PROCEDURE TRAY) IMPLANT
PANNUS RETENTION SYSTEM 2 PAD (MISCELLANEOUS) IMPLANT
PROTECTION STATION PRESSURIZED (MISCELLANEOUS) ×1
SET ATX-X65L (MISCELLANEOUS) IMPLANT
SHEATH AVANTI 5FR X 11CM (SHEATH) IMPLANT
SHEATH GLIDE SLENDER 4/5FR (SHEATH) IMPLANT
STATION PROTECTION PRESSURIZED (MISCELLANEOUS) IMPLANT

## 2023-10-23 NOTE — TOC Transition Note (Signed)
Transition of Care [facility]) - CM/SW Discharge Note   Patient Details  Name: [name] [name] MRN: [id number] Date of Birth: 30-Jan-1935  Transition of Care [facility]) CM/SW Contact:  [name] Rua, LCSW Phone Number: 10/23/2023, 4:17 PM   Clinical Narrative:     Patient to discharge today home with home health services with [facility]  CSW called [facility] [phone])   to ensure they received referral, they report they did but it can take 48-72 hours for them to make a decision if they are able to accept.   CSW updated niece [name] [name] on this information. She reports being agreeable to continue with [facility] and she will reach out to them if she does not hear back.     Final next level of care: Home w Home Health Services Barriers to Discharge: No Barriers Identified   Patient Goals and CMS Choice CMS Medicare.gov Compare Post Acute Care list provided to:: Patient    Discharge Placement                         Discharge Plan and Services Additional resources added to the After Visit Summary for                                       Social Determinants of Health (SDOH) Interventions SDOH Screenings   Food Insecurity: Patient Declined (10/19/2023)  Housing: Patient Declined (10/22/2023)  Transportation Needs: No Transportation Needs (10/22/2023)  Utilities: Patient Declined (10/19/2023)  Alcohol Screen: Low Risk  (10/22/2023)  Depression (PHQ2-9): Medium Risk (10/11/2023)  Financial Resource Strain: Low Risk  (10/22/2023)  Physical Activity: Unknown (12/01/2018)  Social Connections: Unknown (12/01/2018)  Stress: Unknown (12/01/2018)  Tobacco Use: Medium Risk (10/19/2023)     Readmission Risk Interventions    10/19/2023    1:41 PM  Readmission Risk Prevention Plan  Transportation Screening Complete  PCP or Specialist Appt within 5-7 Days Complete  Home Care Screening Complete  Medication Review (RN CM) Not Complete  Med Review comments patient asleep; CSW  spoke with her sons [name] [name] and [name] [name]

## 2023-10-23 NOTE — Interval H&P Note (Signed)
History and Physical Interval Note:  10/23/2023 8:24 AM  [name] [name]  has presented today for surgery, with the diagnosis of Cardioversion  Afib.  The various methods of treatment have been discussed with the patient and family. After consideration of risks, benefits and other options for treatment, the patient has consented to  Procedure(s): CARDIOVERSION (N/A) as a surgical intervention.  The patient's history has been reviewed, patient examined, no change in status, stable for surgery.  I have reviewed the patient's chart and labs.  Questions were answered to the patient's satisfaction.     [name]

## 2023-10-23 NOTE — H&P (View-Only) (Signed)
Advanced Heart Failure Rounding Note  PCP-Cardiologist: [name], MD   Subjective:   Diuretics have been held.   Feels better today. Denies SOB.   Objective:   Weight Range: 110.5 kg Body mass index is 41.8 kg/m.   Vital Signs:   Temp:  [97.8 F (36.6 C)-99 F (37.2 C)] 97.8 F (36.6 C) (11/19 0355) Pulse Rate:  [58-66] 58 (11/19 0355) Resp:  [15-18] 18 (11/19 0355) BP: (108-139)/(47-79) 119/49 (11/19 0355) SpO2:  [90 %-98 %] 90 % (11/19 0355) Last BM Date : 10/19/23  Weight change: Filed Weights   10/19/23 0045 10/21/23 0755  Weight: 108.6 kg 110.5 kg    Intake/Output:  No intake or output data in the 24 hours ending 10/23/23 0745    Physical Exam    General:  In the chair.  No resp difficulty HEENT: Normal Neck: Supple. JVP difficult to assess. Carotids 2+ bilat; no bruits. No lymphadenopathy or thyromegaly appreciated. Cor: PMI nondisplaced. Regular rate & rhythm. No rubs, gallops or murmurs. Lungs: Clear on room air.  Abdomen: Soft, nontender, nondistended. No hepatosplenomegaly. No bruits or masses. Good bowel sounds. Extremities: No cyanosis, clubbing, rash, edema Neuro: Alert & orientedx3, cranial nerves grossly intact. moves all 4 extremities w/o difficulty. Affect pleasant   Telemetry  A -V paced 50-60 s   EKG   N/A  Labs    CBC Recent Labs    10/22/23 0518  WBC 6.6  HGB 9.5*  HCT 27.2*  MCV 76.8*  PLT 127*   Basic Metabolic Panel Recent Labs    82/95/62 0330 10/22/23 0518  NA 137 137  K 3.6 3.4*  CL 101 100  CO2 25 24  GLUCOSE 160* 159*  BUN 46* 52*  CREATININE 2.97* 2.96*  CALCIUM 9.0 9.1  MG  --  2.2   Liver Function Tests No results for input(s): "AST", "ALT", "ALKPHOS", "BILITOT", "PROT", "ALBUMIN" in the last 72 hours. No results for input(s): "LIPASE", "AMYLASE" in the last 72 hours. Cardiac Enzymes No results for input(s): "CKTOTAL", "CKMB", "CKMBINDEX", "TROPONINI" in the last 72 hours.  BNP: BNP  (last 3 results) Recent Labs    03/28/23 1530 10/19/23 0048  BNP 345.0* 577.9*    ProBNP (last 3 results) No results for input(s): "PROBNP" in the last 8760 hours.   D-Dimer No results for input(s): "DDIMER" in the last 72 hours. Hemoglobin A1C No results for input(s): "HGBA1C" in the last 72 hours. Fasting Lipid Panel No results for input(s): "CHOL", "HDL", "LDLCALC", "TRIG", "CHOLHDL", "LDLDIRECT" in the last 72 hours. Thyroid Function Tests Recent Labs    10/22/23 1053  TSH 0.270*    Other results:   Imaging    US RENAL  Result Date: 10/22/2023 CLINICAL DATA:  Acute renal injury EXAM: RENAL / URINARY TRACT ULTRASOUND COMPLETE COMPARISON:  07/03/2019 FINDINGS: Right Kidney: Renal measurements: 9.6 x 4.2 x 4.3 cm. = volume: 90 mL. Mild increased echogenicity is noted. A 1.4 cm parapelvic cyst is noted which is simple in nature. Left Kidney: Renal measurements: 10.5 x 4.2 x 3.9 cm. = volume: 90 mL. Increased echogenicity is noted. Scattered cysts are noted the largest of which measures 4.7 cm. These are simple in nature. Bladder: Decompressed Other: None. IMPRESSION: Increased echogenicity bilaterally consistent with medical renal disease. Bilateral renal cystic change.  No follow-up is recommended. Electronically Signed   By: Alcide Clever M.D.   On: 10/22/2023 20:54     Medications:     Scheduled Medications:  amiodarone  200 mg Oral Daily   amLODipine  5 mg Oral Daily   atorvastatin  20 mg Oral Daily   calcitRIOL  0.25 mcg Oral Daily   carvedilol  25 mg Oral Daily   carvedilol  50 mg Oral QHS   ferrous sulfate  325 mg Oral Daily   guaiFENesin  600 mg Oral BID   insulin aspart  0-5 Units Subcutaneous QHS   insulin aspart  0-9 Units Subcutaneous TID WC   linagliptin  5 mg Oral Daily   multivitamin with minerals  1 tablet Oral Daily   pantoprazole  40 mg Oral Daily   potassium chloride  10 mEq Oral Daily    Infusions:   PRN Medications: acetaminophen **OR**  acetaminophen, chlorpheniramine-HYDROcodone, Glycerin (Adult), ipratropium-albuterol, magnesium hydroxide, meclizine, ondansetron **OR** ondansetron (ZOFRAN) IV, polyethylene glycol, traZODone    Patient Profile  [name] [name] is a 88 year old with a history fo PAF, SSS  MDT PPM, HTN, OSA, HLD, DMII, CKD Stage IV, PVCs, and HFpEF.    Ongoing dyspnea for the last 8 months. Admitted with A/C HFpEF and possible pneumonia.    Assessment/Plan    1. A/C HFpEF Echo preserved EF. BNP 577. Diuresed with IV lasix and developed AKI.  On exam she does not appear volume overloaded. Continue to hold diuretics.  Plan for RHC today. Diurese based on results. .    2. CKD Stage IV Creatinine baseline ~ 2.6 Peaked at 3. Repeat BMET now.  Will need RHC for assess hemodynamics.      3. A flutter  S/P Cardioversion 10/17/23  On bb + amiodarone.  Per EP continue current regimen.  INR stable 2.2  On Coumadin.  Previously seen by Pulmonary for unexplained dyspnea. PFTs mild restrictive pattern.    4.  Pulmonary Hypertension  Unexplained dyspnea. Elevated. RVSP 60. Known OSA. Will need to RHC to further assess hemodynamics.     5. OSA - Using CPAP   BMET in process.    Length of Stay: 4  Madissen Wyse, NP  10/23/2023, 7:45 AM  Advanced Heart Failure Team Pager [phone] (M-F; 7a - 5p)  Please contact [facility] for night-coverage after hours (5p -7a ) and weekends on [email or web address]

## 2023-10-23 NOTE — TOC Progression Note (Addendum)
Transition of Care [facility]) - Progression Note    Patient Details  Name: [name] [name] MRN: [id number] Date of Birth: 1935-05-19  Transition of Care [facility]) CM/SW Contact  Darolyn Rua, [place] Phone Number: 10/23/2023, 10:19 AM  Clinical Narrative:     Update: Niece Annice Pih called this CSW back reports all information should go through her, she reports preference for [facility], and if recs change to SNF preference is [facility]. Reports having Rollator at home, no other dme needs. CSW spoke with [facility] home health [phone] they request [facility] PT order and supporting documents be faxed to [phone]   CSW spoke with patient son Trey Paula regarding Hh recs he reports he will discuss with his family to ensure they are all in agreement and also will follow up on any dme needs and call this CSW back.        Expected Discharge Plan and Services                                               Social Determinants of Health (SDOH) Interventions SDOH Screenings   Food Insecurity: Patient Declined (10/19/2023)  Housing: Patient Declined (10/22/2023)  Transportation Needs: No Transportation Needs (10/22/2023)  Utilities: Patient Declined (10/19/2023)  Alcohol Screen: Low Risk  (10/22/2023)  Depression (PHQ2-9): Medium Risk (10/11/2023)  Financial Resource Strain: Low Risk  (10/22/2023)  Physical Activity: Unknown (12/01/2018)  Social Connections: Unknown (12/01/2018)  Stress: Unknown (12/01/2018)  Tobacco Use: Medium Risk (10/19/2023)    Readmission Risk Interventions    10/19/2023    1:41 PM  Readmission Risk Prevention Plan  Transportation Screening Complete  PCP or Specialist Appt within 5-7 Days Complete  Home Care Screening Complete  Medication Review (RN CM) Not Complete  Med Review comments patient asleep; CSW spoke with her sons Trey Paula and [place]

## 2023-10-23 NOTE — Interval H&P Note (Signed)
History and Physical Interval Note:  10/23/2023 11:12 AM  [name] [name]  has presented today for surgery, with the diagnosis of acute hfpef.  The various methods of treatment have been discussed with the patient and family. After consideration of risks, benefits and other options for treatment, the patient has consented to  Procedure(s): RIGHT HEART CATH (N/A) as a surgical intervention.  The patient's history has been reviewed, patient examined, no change in status, stable for surgery.  I have reviewed the patient's chart and labs.  Questions were answered to the patient's satisfaction.     [name]

## 2023-10-23 NOTE — Discharge Summary (Signed)
[name] [name]:[id number] DOB: 13-Jun-1935 DOA: 10/19/2023  PCP: Dale Eads, MD  Admit date: 10/19/2023 Discharge date: 10/23/2023  Time spent: 35 minutes  Recommendations for Outpatient Follow-up:  Heart failure clinic f/u next week as scheduled Bmp at f/u Pcp f/u     Discharge Diagnoses:  Principal Problem:   CHF (congestive heart failure) (HCC) Active Problems:   Community acquired pneumonia   Acute CHF (congestive heart failure) (HCC)   Essential hypertension   Dyslipidemia   Morbid obesity (HCC)   Atrial fibrillation and flutter (HCC)   Electrocardiography showing paced rhythm with electrical capture   Pulmonary hypertension, unspecified (HCC)   Paced rhythm on cardiac monitor   Discharge Condition: improved  Diet recommendation: heart healthy  Filed Weights   10/19/23 0045 10/21/23 0755  Weight: 108.6 kg 110.5 kg    History of present illness:  From admission h and p [name] [name] is a 88 y.o. African-American female with medical history significant for atrial fibrillation on Coumadin, stage IV chronic kidney disease, GERD, CHF, anxiety, SSS status post pacemaker placement, and type 2 diabetes mellitus, who presented to the emergency room with acute onset of worsening dyspnea with associated midsternal chest pain felt as pressure and graded 10/10 in severity.  She admitted to dry cough with down to wheezing.  She has been having diaphoresis.  No nausea or vomiting.  She admitted to orthopnea and paroxysmal nocturnal dyspnea as well as worsening lower extremity edema.  No headache or dizziness or blurred vision.  No bleeding diathesis.   Hospital Course:  [name] ia a 88 y.o. female w/ PMH atrial fibrillation on Coumadin, stage IV chronic kidney disease, GERD, CHF, anxiety, SSS status post pacemaker placement, and type 2 diabetes mellitus who presented 10/19/23 to ED via EMS from home with worsening shortness of breath <1 day and chest pain. Underwent  cardioversion 11/13 w/ Dr Mariah Milling for Aflutter associated w/ dyspnea, converted NSR, discharged. Patient was attempting to go to sleep 11/14 and wearing her CPAP when she could not tolerate lying flat to breathe.    Hospital course / significant events:  11/15: to ED, admitted to hospitalist service for SOB w/ new O2 requirement likely d/t CHF/CAP. Started diuresis and abx. D/c abx, did not feel strong clinical suspicion for CAP. Echo ordered.  11/16: Cardiology to see patient today, diuresed well and rales improved, Cr worse will hold diuretics for now pending cardiology eval. Echo EF 55-60, normal LV fxn and no RWMA, indeterminate diastolic, severely elevated pulmonary artery pressure,  11/17: back into aflutter overnight. Cardiology planning possible EP eval / HF team eval. Still significant DOE.     Dyspnea d/t Acute CHF (congestive heart failure) HFpEF, pulmonary edema, Pulmonary hypertension - severe  Acute hypoxic respiratory failure d/t above  Normal LV function on TTE. RHC with normal filling pressures, mildly elevated pulmonary artery pressure, normal cardiac output. Diuresed some then held 2/2 aki. Weaned off o2 continue Januvia and Coreg Hf clinic f/u next week Resume lasix on 11/21 Home health PT ordered Discharge reviewed with niece Annice Pih who is her emergency contact and who is in agreement with the plan   Persistent atrial flutter Status post cardioversion 11/13. Maintaining sinus rhythm-AV paced with seemingly appropriate rate response with ambulation however back into Aflutter overnight 11/17 anticoagulated on warfarin  Continue amiodarone and beta-blocker.    Multifocal pneumonia ruled out CXR concerning for pneumonia but given no sepsis, no fever, no significant infectious respiratory symptoms such as cough,  and w/ reasonable etiology for SOB being dx CHF as above  Follow blood cultures --> NGTD    AKI on CKD4 Concern for cardiorenal syndrome / diuresis effect  Caution  w/ diuresis --> holding for now Renal u/s medical renal disease Kidney function now improving with holding diuresis, is approaching baseline, will hold lasix 2 more days, advise check of kidney function at f/u   Anemia of ckd stable   OSA CPAP at bedtime    Essential hypertension Stable on home meds   Dyslipidemia Continue home statin therapy.  Procedures: RHC 11/19   Consultations: cardiology  Discharge Exam: Vitals:   10/23/23 1000 10/23/23 1227  BP: 136/62 (!) 141/68  Pulse: 62 (!) 53  Resp: 19 18  Temp: 98.1 F (36.7 C) 98.3 F (36.8 C)  SpO2: 95% 95%    General: NAD Cardiovascular: RRR Respiratory: CTAB Abdomen: obese, soft and non-tender Trace LE edema, warm  Discharge Instructions   Discharge Instructions     Diet - low sodium heart healthy   Complete by: As directed    Increase activity slowly   Complete by: As directed       Allergies as of 10/23/2023       Reactions   Metoprolol Other (See Comments)   Bad dreams Patient stated that she had suicidal thoughts while taking this medication.   Diltiazem Other (See Comments)   Patient stated that she had bad dreams with this medication   Levofloxacin Nausea And Vomiting, Nausea Only            Medication List     TAKE these medications    acetaminophen 500 MG tablet Commonly known as: TYLENOL Take 1,000 mg by mouth every 6 (six) hours as needed for moderate pain.   allopurinol 100 MG tablet Commonly known as: ZYLOPRIM Take 100 mg by mouth daily.   amiodarone 200 MG tablet Commonly known as: PACERONE TAKE 1 TABLET BY MOUTH EVERY DAY   amLODipine 5 MG tablet Commonly known as: NORVASC Take 5 mg by mouth daily.   artificial tears ointment Place 1 drop into both eyes daily as needed (Dry eyes).   atorvastatin 20 MG tablet Commonly known as: LIPITOR TAKE 1 TABLET (20 MG TOTAL) BY MOUTH DAILY.   calcitRIOL 0.25 MCG capsule Commonly known as: ROCALTROL TAKE 1 CAPSULE (0.25  MCG TOTAL) BY MOUTH 1 (ONE) TIME EACH DAY   carvedilol 25 MG tablet Commonly known as: COREG Take 1-2 tablets (25-50 mg total) by mouth as directed. Take 1 tablet (25MG ) by mouth every morning and 2 tablets (50MG ) by mouth every night before bed What changed: when to take this   CENTRUM SILVER PO Take 1 tablet by mouth daily.   ferrous sulfate 325 (65 FE) MG EC tablet Take 325 mg by mouth daily.   Fifty50 Glucose Meter 2.0 w/Device Kit   FLEET LIQUID GLYCERIN SUPP RE Place 1 Dose rectally daily as needed (constipation).   furosemide 40 MG tablet Commonly known as: LASIX Take 40 mg by mouth daily.   glipiZIDE 10 MG tablet Commonly known as: GLUCOTROL Take 10 mg by mouth daily before breakfast.   Klor-Con M10 10 MEQ tablet Generic drug: potassium chloride Take 10 mEq by mouth daily.   meclizine 25 MG tablet Commonly known as: ANTIVERT Take 25 mg by mouth 3 (three) times daily as needed for dizziness or nausea.   OneTouch Delica Lancets 33G Misc   OneTouch Verio test strip Generic drug: glucose blood 3 (three)  times daily.   pantoprazole 40 MG tablet Commonly known as: PROTONIX Take 40 mg by mouth daily.   polyethylene glycol 17 g packet Commonly known as: MIRALAX / GLYCOLAX Take 17 g by mouth daily as needed for moderate constipation.   sitaGLIPtin 25 MG tablet Commonly known as: JANUVIA Take 25 mg by mouth daily.   warfarin 2 MG tablet Commonly known as: COUMADIN Take as directed. If you are unsure how to take this medication, talk to your nurse or doctor. Original instructions: Take 2 mg by mouth daily at 6 PM.       Allergies  Allergen Reactions   Metoprolol Other (See Comments)    Bad dreams Patient stated that she had suicidal thoughts while taking this medication.    Diltiazem Other (See Comments)    Patient stated that she had bad dreams with this medication   Levofloxacin Nausea And Vomiting and Nausea Only         Follow-up Information      [facility]. Go in 8 day(s).   Specialty: Cardiology Why: Hospital Follow-Up 10/29/23 @ 9:00 am [facility], [address] Free Valet Parking Please bring all medications to appointment Contact information: [address] [place] [address] [phone]        Dale [place], MD Follow up.   Specialty: Internal Medicine Contact information: [address] [place] [address] [phone]                  The results of significant diagnostics from this hospitalization (including imaging, microbiology, ancillary and laboratory) are listed below for reference.    Significant Diagnostic Studies: CARDIAC CATHETERIZATION  Result Date: 10/23/2023 HEMODYNAMICS: RA:   6 mmHg (mean) RV:   45/1-6 mmHg PA:   45/15 mmHg (28 mean) PCWP:  13 mmHg (mean)    Estimated Fick CO/CI   6.3 L/min, 2.9 L/min/m2    TPG    15  mmHg     PVR     2.4 Wood Units PAPi      5  IMPRESSION: Normal pre and post capillary filling pressures. Mildly elevated PA mean and PVR likely secondary to HFpEF Normal cardiac output / index. Aditya Sabharwal 11:57 AM   US RENAL  Result Date: 10/22/2023 CLINICAL DATA:  Acute renal injury EXAM: RENAL / URINARY TRACT ULTRASOUND COMPLETE COMPARISON:  07/03/2019 FINDINGS: Right Kidney: Renal measurements: 9.6 x 4.2 x 4.3 cm. = volume: 90 mL. Mild increased echogenicity is noted. A 1.4 cm parapelvic cyst is noted which is simple in nature. Left Kidney: Renal measurements: 10.5 x 4.2 x 3.9 cm. = volume: 90 mL. Increased echogenicity is noted. Scattered cysts are noted the largest of which measures 4.7 cm. These are simple in nature. Bladder: Decompressed Other: None. IMPRESSION: Increased echogenicity bilaterally consistent with medical renal disease. Bilateral renal cystic change.  No follow-up is recommended. Electronically Signed   By: Alcide Clever M.D.   On: 10/22/2023  20:54   ECHOCARDIOGRAM COMPLETE  Result Date: 10/20/2023    ECHOCARDIOGRAM REPORT   Patient Name:   ADDIS BOBIK Date of Exam: 10/20/2023 Medical Rec #:  [id number]    Height:       64.0 in Accession #:    [id number]   Weight:       239.4 lb Date of Birth:  Jan 12, 1935   BSA:          2.112 m Patient Age:  87 years     BP:           130/64 mmHg Patient Gender: F            HR:           60 bpm. Exam Location:  [facility] Procedure: 2D Echo Indications:     CHF I50.31  History:         Patient has prior history of Echocardiogram examinations, most                  recent 04/25/2023.  Sonographer:     Overton Mam RDCS, FASE Referring Phys:  [id number] Sunnie Nielsen Diagnosing Phys: [name] MD IMPRESSIONS  1. Left ventricular ejection fraction, by estimation, is 55 to 60%. The left ventricle has normal function. The left ventricle has no regional wall motion abnormalities. Left ventricular diastolic parameters are indeterminate.  2. Right ventricular systolic function is low normal. The right ventricular size is mildly enlarged. There is severely elevated pulmonary artery systolic pressure. The estimated right ventricular systolic pressure is 62.8 mmHg.  3. Left atrial size was mildly dilated.  4. The mitral valve is normal in structure. No evidence of mitral valve regurgitation. No evidence of mitral stenosis.  5. Tricuspid valve regurgitation is moderate.  6. The aortic valve is normal in structure. Aortic valve regurgitation is not visualized. No aortic stenosis is present.  7. The inferior vena cava is normal in size with greater than 50% respiratory variability, suggesting right atrial pressure of 3 mmHg. FINDINGS  Left Ventricle: Left ventricular ejection fraction, by estimation, is 55 to 60%. The left ventricle has normal function. The left ventricle has no regional wall motion abnormalities. The left ventricular internal cavity size was normal in size. There is  no left ventricular hypertrophy. Left  ventricular diastolic parameters are indeterminate. Right Ventricle: The right ventricular size is mildly enlarged. No increase in right ventricular wall thickness. Right ventricular systolic function is low normal. There is severely elevated pulmonary artery systolic pressure. The tricuspid regurgitant velocity is 3.80 m/s, and with an assumed right atrial pressure of 5 mmHg, the estimated right ventricular systolic pressure is 62.8 mmHg. Left Atrium: Left atrial size was mildly dilated. Right Atrium: Right atrial size was normal in size. Pericardium: There is no evidence of pericardial effusion. Mitral Valve: The mitral valve is normal in structure. No evidence of mitral valve regurgitation. No evidence of mitral valve stenosis. Tricuspid Valve: The tricuspid valve is normal in structure. Tricuspid valve regurgitation is moderate . No evidence of tricuspid stenosis. Aortic Valve: The aortic valve is normal in structure. Aortic valve regurgitation is not visualized. No aortic stenosis is present. Aortic valve peak gradient measures 9.7 mmHg. Pulmonic Valve: The pulmonic valve was normal in structure. Pulmonic valve regurgitation is mild. No evidence of pulmonic stenosis. Aorta: The aortic root is normal in size and structure. Venous: The inferior vena cava is normal in size with greater than 50% respiratory variability, suggesting right atrial pressure of 3 mmHg. IAS/Shunts: No atrial level shunt detected by color flow Doppler. Additional Comments: A device lead is visualized.  LEFT VENTRICLE PLAX 2D LVIDd:         4.10 cm   Diastology LVIDs:         2.90 cm   LV e' medial:    13.40 cm/s LV PW:         1.30 cm   LV E/e' medial:  9.2 LV IVS:  1.00 cm   LV e' lateral:   14.10 cm/s LVOT diam:     1.80 cm   LV E/e' lateral: 8.7 LV SV:         55 LV SV Index:   26 LVOT Area:     2.54 cm  RIGHT VENTRICLE RV Basal diam:  3.00 cm RV S prime:     16.60 cm/s TAPSE (M-mode): 2.1 cm LEFT ATRIUM           Index         RIGHT ATRIUM           Index LA diam:      4.90 cm 2.32 cm/m   RA Area:     17.40 cm LA Vol (A2C): 62.0 ml 29.36 ml/m  RA Volume:   43.30 ml  20.50 ml/m LA Vol (A4C): 67.1 ml 31.77 ml/m  AORTIC VALVE                 PULMONIC VALVE AV Area (Vmax): 1.84 cm     PV Vmax:          0.96 m/s AV Vmax:        156.00 cm/s  PV Peak grad:     3.7 mmHg AV Peak Grad:   9.7 mmHg     PR End Diast Vel: 6.45 msec LVOT Vmax:      113.00 cm/s LVOT Vmean:     75.000 cm/s LVOT VTI:       0.218 m  AORTA Ao Root diam: 3.00 cm Ao Asc diam:  3.00 cm MITRAL VALVE                TRICUSPID VALVE MV Area (PHT): 3.34 cm     TR Peak grad:   57.8 mmHg MV Decel Time: 227 msec     TR Vmax:        380.00 cm/s MV E velocity: 123.00 cm/s MV A velocity: 35.10 cm/s   SHUNTS MV E/A ratio:  3.50         Systemic VTI:  0.22 m                             Systemic Diam: 1.80 cm [name] MD Electronically signed by [name] MD Signature Date/Time: 10/20/2023/3:16:40 PM    Final    DG Chest Port 1 View  Result Date: 10/19/2023 CLINICAL DATA:  Shortness of breath EXAM: PORTABLE CHEST 1 VIEW COMPARISON:  05/22/2023 FINDINGS: Faint patchy/interstitial opacities in the lungs bilaterally, suggesting mild multifocal infection/pneumonia, lower lobe predominant. No pleural effusions or pneumothorax. Cardiomegaly.  Right subclavian pacemaker. IMPRESSION: Suspected mild multifocal infection/pneumonia, lower lobe predominant. Cardiomegaly. Electronically Signed   By: Charline Bills M.D.   On: 10/19/2023 01:21   CUP PACEART REMOTE DEVICE CHECK  Result Date: 09/24/2023 Scheduled remote reviewed. Normal device function.  Persistent AF episode since 09/03/2023, on OAC, AF burden is 86.3% of the time, sent to triage for persistence Next remote 91 days. ML, CVRS   Microbiology: Recent Results (from the past 240 hour(s))  Resp panel by RT-PCR (RSV, Flu A&B, Covid) Anterior Nasal Swab     Status: None   Collection Time: 10/19/23 12:59 AM    Specimen: Anterior Nasal Swab  Result Value Ref Range Status   SARS Coronavirus 2 by RT PCR NEGATIVE NEGATIVE Final    Comment: (NOTE) SARS-CoV-2 target nucleic acids are NOT DETECTED.  The SARS-CoV-2 RNA is  generally detectable in upper respiratory specimens during the acute phase of infection. The lowest concentration of SARS-CoV-2 viral copies this assay can detect is 138 copies/mL. A negative result does not preclude SARS-Cov-2 infection and should not be used as the sole basis for treatment or other patient management decisions. A negative result may occur with  improper specimen collection/handling, submission of specimen other than nasopharyngeal swab, presence of viral mutation(s) within the areas targeted by this assay, and inadequate number of viral copies(<138 copies/mL). A negative result must be combined with clinical observations, patient history, and epidemiological information. The expected result is Negative.  Fact Sheet for Patients:  BloggerCourse.com  Fact Sheet for Healthcare Providers:  SeriousBroker.it  This test is no t yet approved or cleared by the [place] FDA and  has been authorized for detection and/or diagnosis of SARS-CoV-2 by FDA under an Emergency Use Authorization (EUA). This EUA will remain  in effect (meaning this test can be used) for the duration of the COVID-19 declaration under Section 564(b)(1) of the Act, 21 U.S.C.section 360bbb-3(b)(1), unless the authorization is terminated  or revoked sooner.       Influenza A by PCR NEGATIVE NEGATIVE Final   Influenza B by PCR NEGATIVE NEGATIVE Final    Comment: (NOTE) The Xpert Xpress SARS-CoV-2/FLU/RSV plus assay is intended as an aid in the diagnosis of influenza from Nasopharyngeal swab specimens and should not be used as a sole basis for treatment. Nasal washings and aspirates are unacceptable for Xpert Xpress  SARS-CoV-2/FLU/RSV testing.  Fact Sheet for Patients: BloggerCourse.com  Fact Sheet for Healthcare Providers: SeriousBroker.it  This test is not yet approved or cleared by the [place] FDA and has been authorized for detection and/or diagnosis of SARS-CoV-2 by FDA under an Emergency Use Authorization (EUA). This EUA will remain in effect (meaning this test can be used) for the duration of the COVID-19 declaration under Section 564(b)(1) of the Act, 21 U.S.C. section 360bbb-3(b)(1), unless the authorization is terminated or revoked.     Resp Syncytial Virus by PCR NEGATIVE NEGATIVE Final    Comment: (NOTE) Fact Sheet for Patients: BloggerCourse.com  Fact Sheet for Healthcare Providers: SeriousBroker.it  This test is not yet approved or cleared by the [place] FDA and has been authorized for detection and/or diagnosis of SARS-CoV-2 by FDA under an Emergency Use Authorization (EUA). This EUA will remain in effect (meaning this test can be used) for the duration of the COVID-19 declaration under Section 564(b)(1) of the Act, 21 U.S.C. section 360bbb-3(b)(1), unless the authorization is terminated or revoked.  Performed at [facility], [address] [place], [place] [address]   Culture, blood (routine x 2)     Status: None (Preliminary result)   Collection Time: 10/19/23  2:23 AM   Specimen: BLOOD  Result Value Ref Range Status   Specimen Description BLOOD BLOOD RIGHT ARM  Final   Special Requests   Final    BOTTLES DRAWN AEROBIC AND ANAEROBIC Blood Culture adequate volume   Culture   Final    NO GROWTH 4 DAYS Performed at [facility], [address] [place], [place] [address]    Report Status PENDING  Incomplete  Culture, blood (routine x 2)     Status: None (Preliminary result)   Collection Time: 10/19/23  2:36 AM   Specimen: BLOOD   Result Value Ref Range Status   Specimen Description BLOOD BLOOD LEFT ARM  Final   Special Requests   Final  BOTTLES DRAWN AEROBIC AND ANAEROBIC Blood Culture adequate volume   Culture   Final    NO GROWTH 4 DAYS Performed at [facility], [address] [place] [place], [place] [address]    Report Status PENDING  Incomplete     Labs: Basic Metabolic Panel: Recent Labs  Lab 10/19/23 0530 10/20/23 0516 10/21/23 0330 10/22/23 0518 10/23/23 0537 10/23/23 1151 10/23/23 1152  NA 138 140 137 137 138 140 140  K 4.1 3.9 3.6 3.4* 3.9 4.1 3.9  CL 101 101 101 100 103  --   --   CO2 26 27 25 24 24   --   --   GLUCOSE 377* 186* 160* 159* 126*  --   --   BUN 34* 39* 46* 52* 47*  --   --   CREATININE 2.59* 2.85* 2.97* 2.96* 2.72*  --   --   CALCIUM 9.1 9.1 9.0 9.1 9.1  --   --   MG  --   --   --  2.2  --   --   --    Liver Function Tests: Recent Labs  Lab 10/19/23 0048  AST 56*  ALT 45*  ALKPHOS 84  BILITOT 0.8  PROT 6.8  ALBUMIN 3.8   No results for input(s): "LIPASE", "AMYLASE" in the last 168 hours. No results for input(s): "AMMONIA" in the last 168 hours. CBC: Recent Labs  Lab 10/19/23 0048 10/19/23 0530 10/22/23 0518 10/23/23 1151 10/23/23 1152  WBC 7.4 9.9 6.6  --   --   NEUTROABS 5.4  --   --   --   --   HGB 11.4* 11.3* 9.5* 9.5* 9.5*  HCT 33.7* 34.6* 27.2* 28.0* 28.0*  MCV 79.7* 81.2 76.8*  --   --   PLT 140* 157 127*  --   --    Cardiac Enzymes: No results for input(s): "CKTOTAL", "CKMB", "CKMBINDEX", "TROPONINI" in the last 168 hours. BNP: BNP (last 3 results) Recent Labs    03/28/23 1530 10/19/23 0048  BNP 345.0* 577.9*    ProBNP (last 3 results) No results for input(s): "PROBNP" in the last 8760 hours.  CBG: Recent Labs  Lab 10/22/23 1200 10/22/23 1540 10/22/23 2114 10/23/23 0842 10/23/23 1230  GLUCAP 217* 169* 228* 171* 141*       Signed:  Silvano Bilis MD.  [facility] 10/23/2023, 2:09 PM

## 2023-10-23 NOTE — Telephone Encounter (Signed)
Patient Product/process development scientist completed.    The patient is insured through [employer]/ [employer]. Patient has Medicare and is not eligible for a copay card, but may be able to apply for patient assistance, if available.    Ran test claim for Eliquis 2.5 mg and the current 30 day co-pay is $11.20.   This test claim was processed through [facility]- copay amounts may vary at other pharmacies due to pharmacy/plan contracts, or as the patient moves through the different stages of their insurance plan.     Roland Earl, CPHT Pharmacy Technician III Certified Patient Advocate [facility] Patient Advocate Team Direct Number: [phone]  Fax: [phone]

## 2023-10-23 NOTE — Discharge Instructions (Signed)
Resume your lasix (furosemide) on Thursday, 10/25/2023

## 2023-10-23 NOTE — H&P (Signed)
H&P Addendum, pre-cardioversion ° °Patient was seen and evaluated prior to -cardioversion procedure °Symptoms, prior testing details again confirmed with the patient °Patient examined, no significant change from prior exam °Lab work reviewed in detail personally by myself °Patient understands risk and benefit of the procedure,  °The risks (stroke, cardiac arrhythmias rarely resulting in the need for a temporary or permanent pacemaker, skin irritation or burns and complications associated with conscious sedation including aspiration, arrhythmia, respiratory failure and death), benefits (restoration of normal sinus rhythm) and alternatives of a direct current cardioversion were explained in detail °Patient willing to proceed. ° °Signed, °Tim Da Michelle, MD, Ph.D °[facility]  °

## 2023-10-23 NOTE — Progress Notes (Signed)
Advanced Heart Failure Rounding Note  PCP-Cardiologist: [name], MD   Subjective:   Diuretics have been held.   Feels better today. Denies SOB.   Objective:   Weight Range: 110.5 kg Body mass index is 41.8 kg/m.   Vital Signs:   Temp:  [97.8 F (36.6 C)-99 F (37.2 C)] 97.8 F (36.6 C) (11/19 0355) Pulse Rate:  [58-66] 58 (11/19 0355) Resp:  [15-18] 18 (11/19 0355) BP: (108-139)/(47-79) 119/49 (11/19 0355) SpO2:  [90 %-98 %] 90 % (11/19 0355) Last BM Date : 10/19/23  Weight change: Filed Weights   10/19/23 0045 10/21/23 0755  Weight: 108.6 kg 110.5 kg    Intake/Output:  No intake or output data in the 24 hours ending 10/23/23 0745    Physical Exam    General:  In the chair.  No resp difficulty HEENT: Normal Neck: Supple. JVP difficult to assess. Carotids 2+ bilat; no bruits. No lymphadenopathy or thyromegaly appreciated. Cor: PMI nondisplaced. Regular rate & rhythm. No rubs, gallops or murmurs. Lungs: Clear on room air.  Abdomen: Soft, nontender, nondistended. No hepatosplenomegaly. No bruits or masses. Good bowel sounds. Extremities: No cyanosis, clubbing, rash, edema Neuro: Alert & orientedx3, cranial nerves grossly intact. moves all 4 extremities w/o difficulty. Affect pleasant   Telemetry  A -V paced 50-60 s   EKG   N/A  Labs    CBC Recent Labs    10/22/23 0518  WBC 6.6  HGB 9.5*  HCT 27.2*  MCV 76.8*  PLT 127*   Basic Metabolic Panel Recent Labs    82/95/62 0330 10/22/23 0518  NA 137 137  K 3.6 3.4*  CL 101 100  CO2 25 24  GLUCOSE 160* 159*  BUN 46* 52*  CREATININE 2.97* 2.96*  CALCIUM 9.0 9.1  MG  --  2.2   Liver Function Tests No results for input(s): "AST", "ALT", "ALKPHOS", "BILITOT", "PROT", "ALBUMIN" in the last 72 hours. No results for input(s): "LIPASE", "AMYLASE" in the last 72 hours. Cardiac Enzymes No results for input(s): "CKTOTAL", "CKMB", "CKMBINDEX", "TROPONINI" in the last 72 hours.  BNP: BNP  (last 3 results) Recent Labs    03/28/23 1530 10/19/23 0048  BNP 345.0* 577.9*    ProBNP (last 3 results) No results for input(s): "PROBNP" in the last 8760 hours.   D-Dimer No results for input(s): "DDIMER" in the last 72 hours. Hemoglobin A1C No results for input(s): "HGBA1C" in the last 72 hours. Fasting Lipid Panel No results for input(s): "CHOL", "HDL", "LDLCALC", "TRIG", "CHOLHDL", "LDLDIRECT" in the last 72 hours. Thyroid Function Tests Recent Labs    10/22/23 1053  TSH 0.270*    Other results:   Imaging    US RENAL  Result Date: 10/22/2023 CLINICAL DATA:  Acute renal injury EXAM: RENAL / URINARY TRACT ULTRASOUND COMPLETE COMPARISON:  07/03/2019 FINDINGS: Right Kidney: Renal measurements: 9.6 x 4.2 x 4.3 cm. = volume: 90 mL. Mild increased echogenicity is noted. A 1.4 cm parapelvic cyst is noted which is simple in nature. Left Kidney: Renal measurements: 10.5 x 4.2 x 3.9 cm. = volume: 90 mL. Increased echogenicity is noted. Scattered cysts are noted the largest of which measures 4.7 cm. These are simple in nature. Bladder: Decompressed Other: None. IMPRESSION: Increased echogenicity bilaterally consistent with medical renal disease. Bilateral renal cystic change.  No follow-up is recommended. Electronically Signed   By: Alcide Clever M.D.   On: 10/22/2023 20:54     Medications:     Scheduled Medications:  amiodarone  200 mg Oral Daily   amLODipine  5 mg Oral Daily   atorvastatin  20 mg Oral Daily   calcitRIOL  0.25 mcg Oral Daily   carvedilol  25 mg Oral Daily   carvedilol  50 mg Oral QHS   ferrous sulfate  325 mg Oral Daily   guaiFENesin  600 mg Oral BID   insulin aspart  0-5 Units Subcutaneous QHS   insulin aspart  0-9 Units Subcutaneous TID WC   linagliptin  5 mg Oral Daily   multivitamin with minerals  1 tablet Oral Daily   pantoprazole  40 mg Oral Daily   potassium chloride  10 mEq Oral Daily    Infusions:   PRN Medications: acetaminophen **OR**  acetaminophen, chlorpheniramine-HYDROcodone, Glycerin (Adult), ipratropium-albuterol, magnesium hydroxide, meclizine, ondansetron **OR** ondansetron (ZOFRAN) IV, polyethylene glycol, traZODone    Patient Profile  [name] [name] is a 88 year old with a history fo PAF, SSS  MDT PPM, HTN, OSA, HLD, DMII, CKD Stage IV, PVCs, and HFpEF.    Ongoing dyspnea for the last 8 months. Admitted with A/C HFpEF and possible pneumonia.    Assessment/Plan    1. A/C HFpEF Echo preserved EF. BNP 577. Diuresed with IV lasix and developed AKI.  On exam she does not appear volume overloaded. Continue to hold diuretics.  Plan for RHC today. Diurese based on results. .    2. CKD Stage IV Creatinine baseline ~ 2.6 Peaked at 3. Repeat BMET now.  Will need RHC for assess hemodynamics.      3. A flutter  S/P Cardioversion 10/17/23  On bb + amiodarone.  Per EP continue current regimen.  INR stable 2.2  On Coumadin.  Previously seen by Pulmonary for unexplained dyspnea. PFTs mild restrictive pattern.    4.  Pulmonary Hypertension  Unexplained dyspnea. Elevated. RVSP 60. Known OSA. Will need to RHC to further assess hemodynamics.     5. OSA - Using CPAP   BMET in process.    Length of Stay: 4  Madissen Wyse, NP  10/23/2023, 7:45 AM  Advanced Heart Failure Team Pager [phone] (M-F; 7a - 5p)  Please contact [facility] for night-coverage after hours (5p -7a ) and weekends on [email or web address]

## 2023-10-24 DIAGNOSIS — I5033 Acute on chronic diastolic (congestive) heart failure: Secondary | ICD-10-CM

## 2023-10-24 DIAGNOSIS — I5032 Chronic diastolic (congestive) heart failure: Secondary | ICD-10-CM | POA: Diagnosis not present

## 2023-10-24 DIAGNOSIS — Z6841 Body Mass Index (BMI) 40.0 and over, adult: Secondary | ICD-10-CM | POA: Diagnosis not present

## 2023-10-24 LAB — CULTURE, BLOOD (ROUTINE X 2)
Culture: NO GROWTH
Culture: NO GROWTH
Special Requests: ADEQUATE
Special Requests: ADEQUATE

## 2023-10-25 ENCOUNTER — Telehealth: Payer: Self-pay | Admitting: Internal Medicine

## 2023-10-25 MED ORDER — AMLODIPINE BESYLATE 5 MG PO TABS: 5.0000 mg | ORAL_TABLET | Freq: Every day | ORAL | 1 refills | Status: DC

## 2023-10-25 MED ORDER — SITAGLIPTIN PHOSPHATE 25 MG PO TABS
25.0000 mg | ORAL_TABLET | Freq: Every day | ORAL | 1 refills | Status: AC
Start: 2023-10-25 — End: ?

## 2023-10-25 NOTE — Telephone Encounter (Signed)
I am ok with refilling amlodipine and januvia.  Amiodarone will need to be refilled by cardiology.  Let us know if any problems. I do not want her to be out of her medication.

## 2023-10-25 NOTE — Telephone Encounter (Signed)
Prescription Request  10/25/2023  LOV: 10/11/2023  What is the name of the medication or equipment? Amlodipine, januvia and amiodarone  Have you contacted your pharmacy to request a refill? No   Which pharmacy would you like this sent to?  [id number] [address], [place] - [address] [place] [address] Phone: [phone] Fax: [phone]    Patient notified that their request is being sent to the clinical staff for review and that they should receive a response within 2 business days.   Please advise at Mobile [phone] (mobile)

## 2023-10-25 NOTE — Telephone Encounter (Signed)
Amlodipine and Januvia refilled per dr Lorin Picket. Will call pt to notify her that Amiodarone will need to be refilled by cardiology

## 2023-10-25 NOTE — Telephone Encounter (Signed)
Unable to reach pt. Pt has a call block on her telephone. Mychart msg sent

## 2023-10-26 NOTE — Progress Notes (Unsigned)
Advanced Heart Failure Clinic Note   Referring Physician: hospitalist PCP: Dale Export, MD Cardiologist: [name], MD   HPI:  [name] [name] is a 88 y/o female with a history of HTN, dyslipidemia, obesity, atrial fibrillation/ flutter, pulmonary HTN, CKD IV, SSS s/p pacemaker, T2DM, anxiety and chronic heart failure.   Admitted 10/19/23 due to acute onset of worsening dyspnea with associated midsternal chest pain felt as pressure and graded 10/10 in severity. She admitted to dry cough with down to wheezing. Orthopnea and paroxysmal nocturnal dyspnea as well as worsening lower extremity edema. Had been cardioverted 2 days prior. Cardiology consulted. Diuresed. Echo EF 55-60, normal LV fxn and no RWMA, indeterminate diastolic, severely elevated pulmonary artery pressure. Normal LV function on TTE. RHC with normal filling pressures, mildly elevated pulmonary artery pressure, normal cardiac output. Diuresed some then held 2/2 aki. Weaned off o2. Aflutter overnight 11/17.        Review of Systems: [y] = yes, [ ]  = no   General: Weight gain [ ] ; Weight loss [ ] ; Anorexia [ ] ; Fatigue [ ] ; Fever [ ] ; Chills [ ] ; Weakness [ ]   Cardiac: Chest pain/pressure [ ] ; Resting SOB [ ] ; Exertional SOB [ ] ; Orthopnea [ ] ; Pedal Edema [ ] ; Palpitations [ ] ; Syncope [ ] ; Presyncope [ ] ; Paroxysmal nocturnal dyspnea[ ]   Pulmonary: Cough [ ] ; Wheezing[ ] ; Hemoptysis[ ] ; Sputum [ ] ; Snoring [ ]   GI: Vomiting[ ] ; Dysphagia[ ] ; Melena[ ] ; Hematochezia [ ] ; Heartburn[ ] ; Abdominal pain [ ] ; Constipation [ ] ; Diarrhea [ ] ; BRBPR [ ]   GU: Hematuria[ ] ; Dysuria [ ] ; Nocturia[ ]   Vascular: Pain in legs with walking [ ] ; Pain in feet with lying flat [ ] ; Non-healing sores [ ] ; Stroke [ ] ; TIA [ ] ; Slurred speech [ ] ;  Neuro: Headaches[ ] ; Vertigo[ ] ; Seizures[ ] ; Paresthesias[ ] ;Blurred vision [ ] ; Diplopia [ ] ; Vision changes [ ]   Ortho/Skin: Arthritis [ ] ; Joint pain [ ] ; Muscle pain [ ] ; Joint swelling [ ] ; Back  Pain [ ] ; Rash [ ]   Psych: Depression[ ] ; Anxiety[ ]   Heme: Bleeding problems [ ] ; Clotting disorders [ ] ; Anemia [ ]   Endocrine: Diabetes [ ] ; Thyroid dysfunction[ ]    Past Medical History:  Diagnosis Date   1st degree AV block    a. PR 400 msec with Apacing   A-fib (HCC)    Hx of   Anemia    Anxiety    Arthritis    knees and lower back    Chest pain    a. 2004 Cath:  reportedly nl;  b. 09/2013 Neg MV; c. 11/2017 nl ETT; d. 08/2023 MV: No isch/infarct. nl LV fxn. Mild Ao Ca2+.   Chronic diastolic heart failure (HCC)    a. 09/2013 Echo: EF 50-55%, mild LVH, mild to mod MR/TR; b. 04/2023 Echo: EF 55-60%, no rwma, nl RV size/fxn, RVSP 44.63mmHg. Mod dil LA. Mod MR/TR.   CKD (chronic kidney disease), stage IV (HCC)    Colon polyps    Congestive heart disease (HCC)    Esophageal reflux    Glaucoma    Gout    H/O hiatal hernia    History of UTI    Hyperlipidemia    Moderate mitral regurgitation    a. 04/2023 Echo: Mod MR.   Morbid obesity (HCC)    OSA (obstructive sleep apnea)    OSA on CPAP    CPAP   Pacemaker    copy of medtronic card  in chart   PAF (paroxysmal atrial fibrillation) (HCC)    a. 10/2023 s/p DCCV (150J); b. CHA2DS2VASc = 5-->warfarin.   Primary hypertension    controlled on meds   Pulmonary hypertension (HCC)    a. 04/2023 Echo: RVSP 44.42mmHg.   Seasonal allergies    SSS (sick sinus syndrome) (HCC)    a. 2008 s/p MDT PPM;  b. 11/2013 Gen change- MDT ADDRL1 Adapta DC PPM, ser # [id number].   Symptomatic PVCs    Syncope and collapse    a. Felt to be vasovagal.   Thyroid disease    nodules on thyroid, with goiter, on no meds per pt   Tricuspid regurgitation    Type II diabetes mellitus (HCC)    Type 2   Unspecified glaucoma(365.9)    Vertigo    in past   Wears dentures    upper full and lower partial    Current Outpatient Medications  Medication Sig Dispense Refill   acetaminophen (TYLENOL) 500 MG tablet Take 1,000 mg by mouth every 6 (six) hours as  needed for moderate pain.      allopurinol (ZYLOPRIM) 100 MG tablet Take 100 mg by mouth daily.     amiodarone (PACERONE) 200 MG tablet TAKE 1 TABLET BY MOUTH EVERY DAY 90 tablet 0   amLODipine (NORVASC) 5 MG tablet Take 1 tablet (5 mg total) by mouth daily. 90 tablet 1   atorvastatin (LIPITOR) 20 MG tablet TAKE 1 TABLET (20 MG TOTAL) BY MOUTH DAILY. 90 tablet 0   Blood Glucose Monitoring Suppl (FIFTY50 GLUCOSE METER 2.0) w/Device KIT      calcitRIOL (ROCALTROL) 0.25 MCG capsule TAKE 1 CAPSULE (0.25 MCG TOTAL) BY MOUTH 1 (ONE) TIME EACH DAY     carvedilol (COREG) 25 MG tablet Take 1-2 tablets (25-50 mg total) by mouth as directed. Take 1 tablet (25MG ) by mouth every morning and 2 tablets (50MG ) by mouth every night before bed (Patient taking differently: Take 25-50 mg by mouth See admin instructions. Take 1 tablet (25MG ) by mouth every morning and 2 tablets (50MG ) by mouth every night before bed) 270 tablet 3   ferrous sulfate 325 (65 FE) MG EC tablet Take 325 mg by mouth daily.      furosemide (LASIX) 40 MG tablet Take 40 mg by mouth daily.     glipiZIDE (GLUCOTROL) 10 MG tablet Take 10 mg by mouth daily before breakfast.     Glycerin, Laxative, (FLEET LIQUID GLYCERIN SUPP RE) Place 1 Dose rectally daily as needed (constipation).     KLOR-CON M10 10 MEQ tablet Take 10 mEq by mouth daily.  3   meclizine (ANTIVERT) 25 MG tablet Take 25 mg by mouth 3 (three) times daily as needed for dizziness or nausea.     Multiple Vitamins-Minerals (CENTRUM SILVER PO) Take 1 tablet by mouth daily.      OneTouch Delica Lancets 33G MISC      ONETOUCH VERIO test strip 3 (three) times daily.     pantoprazole (PROTONIX) 40 MG tablet Take 40 mg by mouth daily.     polyethylene glycol (MIRALAX / GLYCOLAX) packet Take 17 g by mouth daily as needed for moderate constipation.     sitaGLIPtin (JANUVIA) 25 MG tablet Take 1 tablet (25 mg total) by mouth daily. 90 tablet 1   warfarin (COUMADIN) 2 MG tablet Take 2 mg by mouth  daily at 6 PM.     White Petrolatum-Mineral Oil (ARTIFICIAL TEARS) ointment Place 1 drop into both eyes daily  as needed (Dry eyes).     No current facility-administered medications for this visit.    Allergies  Allergen Reactions   Metoprolol Other (See Comments)    Bad dreams Patient stated that she had suicidal thoughts while taking this medication.    Diltiazem Other (See Comments)    Patient stated that she had bad dreams with this medication   Levofloxacin Nausea And Vomiting and Nausea Only           Social History   Socioeconomic History   Marital status: Widowed    Spouse name: Not on file   Number of children: 2   Years of education: Not on file   Highest education level: 12th grade  Occupational History   Occupation: retired  Tobacco Use   Smoking status: Former    Current packs/day: 0.00    Average packs/day: 0.5 packs/day for 34.0 years (17.0 ttl pk-yrs)    Types: Cigarettes    Start date: 08/16/1955    Quit date: 08/15/1989    Years since quitting: 34.2   Smokeless tobacco: Never  Vaping Use   Vaping status: Never Used  Substance and Sexual Activity   Alcohol use: No    Alcohol/week: 0.0 standard drinks of alcohol   Drug use: No   Sexual activity: Never  Other Topics Concern   Not on file  Social History Narrative   Retired. Widowed. Regularly exercises.    Social Determinants of Health   Financial Resource Strain: Low Risk  (10/22/2023)   Overall Financial Resource Strain (CARDIA)    Difficulty of Paying Living Expenses: Not hard at all  Food Insecurity: Patient Declined (10/19/2023)   Hunger Vital Sign    Worried About Running Out of Food in the Last Year: Patient declined    Ran Out of Food in the Last Year: Patient declined  Transportation Needs: No Transportation Needs (10/22/2023)   PRAPARE - Administrator, Civil Service (Medical): No    Lack of Transportation (Non-Medical): No  Physical Activity: Unknown (12/01/2018)    Exercise Vital Sign    Days of Exercise per Week: Patient declined    Minutes of Exercise per Session: Patient declined  Stress: Unknown (12/01/2018)   [employer] of Occupational Health - Occupational Stress Questionnaire    Feeling of Stress : Patient declined  Social Connections: Unknown (12/01/2018)   Social Connection and Isolation Panel [NHANES]    Frequency of Communication with Friends and Family: Patient declined    Frequency of Social Gatherings with Friends and Family: Patient declined    Attends Religious Services: Patient declined    Database administrator or Organizations: Patient declined    Attends Banker Meetings: Patient declined    Marital Status: Patient declined  Intimate Partner Violence: Patient Declined (10/19/2023)   Humiliation, Afraid, Rape, and Kick questionnaire    Fear of Current or Ex-Partner: Patient declined    Emotionally Abused: Patient declined    Physically Abused: Patient declined    Sexually Abused: Patient declined      Family History  Problem Relation Age of Onset   Cancer Mother    Liver cancer Mother    Pancreatic cancer Mother    Heart disease Father    Rheum arthritis Father    Allergies Father    Hypertension Sister    Rheum arthritis Sister    COPD Brother    Breast cancer Maternal Aunt        PHYSICAL EXAM: General:  Well appearing. No respiratory difficulty HEENT: normal Neck: supple. no JVD. Carotids 2+ bilat; no bruits. No lymphadenopathy or thyromegaly appreciated. Cor: PMI nondisplaced. Regular rate & rhythm. No rubs, gallops or murmurs. Lungs: clear Abdomen: soft, nontender, nondistended. No hepatosplenomegaly. No bruits or masses. Good bowel sounds. Extremities: no cyanosis, clubbing, rash, edema Neuro: alert & oriented x 3, cranial nerves grossly intact. moves all 4 extremities w/o difficulty. Affect pleasant.  ECG:   ASSESSMENT & PLAN:     [name] Freeze, FNP 10/26/23

## 2023-10-29 ENCOUNTER — Other Ambulatory Visit: Payer: Self-pay | Admitting: Family

## 2023-10-29 ENCOUNTER — Ambulatory Visit: Payer: PPO | Attending: Family | Admitting: Family

## 2023-10-29 ENCOUNTER — Encounter: Payer: Self-pay | Admitting: Family

## 2023-10-29 ENCOUNTER — Telehealth: Payer: Self-pay | Admitting: Internal Medicine

## 2023-10-29 VITALS — BP 135/55 | HR 60 | Resp 18 | Wt 241.0 lb

## 2023-10-29 DIAGNOSIS — I272 Pulmonary hypertension, unspecified: Secondary | ICD-10-CM | POA: Diagnosis not present

## 2023-10-29 DIAGNOSIS — Z7984 Long term (current) use of oral hypoglycemic drugs: Secondary | ICD-10-CM | POA: Insufficient documentation

## 2023-10-29 DIAGNOSIS — Z79899 Other long term (current) drug therapy: Secondary | ICD-10-CM | POA: Diagnosis not present

## 2023-10-29 DIAGNOSIS — Z7901 Long term (current) use of anticoagulants: Secondary | ICD-10-CM | POA: Diagnosis not present

## 2023-10-29 DIAGNOSIS — I4891 Unspecified atrial fibrillation: Secondary | ICD-10-CM | POA: Diagnosis not present

## 2023-10-29 DIAGNOSIS — I10 Essential (primary) hypertension: Secondary | ICD-10-CM

## 2023-10-29 DIAGNOSIS — Z950 Presence of cardiac pacemaker: Secondary | ICD-10-CM | POA: Diagnosis not present

## 2023-10-29 DIAGNOSIS — G4733 Obstructive sleep apnea (adult) (pediatric): Secondary | ICD-10-CM | POA: Insufficient documentation

## 2023-10-29 DIAGNOSIS — E1122 Type 2 diabetes mellitus with diabetic chronic kidney disease: Secondary | ICD-10-CM | POA: Insufficient documentation

## 2023-10-29 DIAGNOSIS — I4892 Unspecified atrial flutter: Secondary | ICD-10-CM | POA: Insufficient documentation

## 2023-10-29 DIAGNOSIS — N184 Chronic kidney disease, stage 4 (severe): Secondary | ICD-10-CM

## 2023-10-29 DIAGNOSIS — E785 Hyperlipidemia, unspecified: Secondary | ICD-10-CM | POA: Diagnosis not present

## 2023-10-29 DIAGNOSIS — I5032 Chronic diastolic (congestive) heart failure: Secondary | ICD-10-CM | POA: Diagnosis not present

## 2023-10-29 DIAGNOSIS — I130 Hypertensive heart and chronic kidney disease with heart failure and stage 1 through stage 4 chronic kidney disease, or unspecified chronic kidney disease: Secondary | ICD-10-CM | POA: Diagnosis not present

## 2023-10-29 DIAGNOSIS — R0602 Shortness of breath: Secondary | ICD-10-CM | POA: Diagnosis not present

## 2023-10-29 DIAGNOSIS — I428 Other cardiomyopathies: Secondary | ICD-10-CM | POA: Insufficient documentation

## 2023-10-29 MED ORDER — APIXABAN 2.5 MG PO TABS: 2.5000 mg | ORAL_TABLET | Freq: Two times a day (BID) | ORAL | 5 refills | Status: DC

## 2023-10-29 NOTE — Telephone Encounter (Signed)
Pt niece called stating she would like to be called concerning a referral  [phone]

## 2023-10-29 NOTE — Patient Instructions (Addendum)
Take your LAST DOSE of Warfarin today then, DISCONTINUE  START ELIQUIS 2.5 TWICE DAILY TOMORROW  Go DOWN to LOWER LEVEL (LL) to have your blood work completed inside of [employer] office.  We will only call you if the results are abnormal or if the provider would like to make medication changes.  FOLLOW UP WITH DR. Gasper Lloyd

## 2023-10-30 ENCOUNTER — Encounter: Payer: Self-pay | Admitting: Family

## 2023-10-30 ENCOUNTER — Ambulatory Visit: Payer: PPO | Admitting: Internal Medicine

## 2023-10-30 ENCOUNTER — Encounter: Payer: Self-pay | Admitting: Internal Medicine

## 2023-10-30 VITALS — BP 128/72 | HR 75 | Temp 98.2°F | Resp 16 | Ht 64.0 in | Wt 238.0 lb

## 2023-10-30 DIAGNOSIS — I272 Pulmonary hypertension, unspecified: Secondary | ICD-10-CM

## 2023-10-30 DIAGNOSIS — R600 Localized edema: Secondary | ICD-10-CM | POA: Diagnosis not present

## 2023-10-30 DIAGNOSIS — E785 Hyperlipidemia, unspecified: Secondary | ICD-10-CM

## 2023-10-30 DIAGNOSIS — R0602 Shortness of breath: Secondary | ICD-10-CM

## 2023-10-30 DIAGNOSIS — E782 Mixed hyperlipidemia: Secondary | ICD-10-CM | POA: Diagnosis not present

## 2023-10-30 DIAGNOSIS — I10 Essential (primary) hypertension: Secondary | ICD-10-CM | POA: Diagnosis not present

## 2023-10-30 DIAGNOSIS — E119 Type 2 diabetes mellitus without complications: Secondary | ICD-10-CM | POA: Diagnosis not present

## 2023-10-30 DIAGNOSIS — D631 Anemia in chronic kidney disease: Secondary | ICD-10-CM

## 2023-10-30 DIAGNOSIS — I4892 Unspecified atrial flutter: Secondary | ICD-10-CM

## 2023-10-30 DIAGNOSIS — I5032 Chronic diastolic (congestive) heart failure: Secondary | ICD-10-CM

## 2023-10-30 DIAGNOSIS — N184 Chronic kidney disease, stage 4 (severe): Secondary | ICD-10-CM | POA: Diagnosis not present

## 2023-10-30 DIAGNOSIS — I4891 Unspecified atrial fibrillation: Secondary | ICD-10-CM

## 2023-10-30 DIAGNOSIS — I480 Paroxysmal atrial fibrillation: Secondary | ICD-10-CM | POA: Diagnosis not present

## 2023-10-30 DIAGNOSIS — N2581 Secondary hyperparathyroidism of renal origin: Secondary | ICD-10-CM

## 2023-10-30 DIAGNOSIS — G4733 Obstructive sleep apnea (adult) (pediatric): Secondary | ICD-10-CM

## 2023-10-30 LAB — BASIC METABOLIC PANEL
BUN/Creatinine Ratio: 9 — ABNORMAL LOW (ref 12–28)
BUN: 23 mg/dL (ref 8–27)
CO2: 22 mmol/L (ref 20–29)
Calcium: 9 mg/dL (ref 8.7–10.3)
Chloride: 103 mmol/L (ref 96–106)
Creatinine, Ser: 2.65 mg/dL — ABNORMAL HIGH (ref 0.57–1.00)
Glucose: 181 mg/dL — ABNORMAL HIGH (ref 70–99)
Potassium: 4.4 mmol/L (ref 3.5–5.2)
Sodium: 142 mmol/L (ref 134–144)
eGFR: 17 mL/min/{1.73_m2} — ABNORMAL LOW (ref >59)

## 2023-10-30 NOTE — Assessment & Plan Note (Signed)
Weight is down today. Lower extremity swelling has improved.  Taking lasix daily. Sees Dr Mariah Milling. Continue current medications as outlined. GFR yesterday 17. Saw Tina yesterday - heart failure clinic.  Discussed continuing daily weights.  Monitor salt/sodium intake. Keep f/u with cardiology. No changes today.

## 2023-10-30 NOTE — Assessment & Plan Note (Signed)
Admitted 10/19/23 - 10/23/23 - after presenting with sob and chest pain. Was s/p cardioversion for aflutter 10/17/23. Noticed worsening sob 11/14. Cardiology consulted. Diuresed. Echo EF 55-60, normal LV fxn and no RWMA, indeterminate diastolic, severely elevated pulmonary artery pressure. Lasix held secondary to acute kidney injury. Required oxygen initially. Weaned off. Renal function improved and oral diuretics resumed. Back in aflutter 10/21/23. Per note, cardiology planning possible EP evaluation. Was discharged 10/23/23 - to home.  Had f/u with heart failure clinic yesterday.  Weight down. Recommended to continue carvedilol, furosemide, amiodarone.  Met b yesterday - GFR 17. Per note -  started on eliquis with coumadin stopped. Appears to be in SR today. Keep f/u with cardiology.

## 2023-10-30 NOTE — Assessment & Plan Note (Signed)
Continue statin therapy.

## 2023-10-30 NOTE — Assessment & Plan Note (Signed)
Being followed by Dr Smith Robert.  Last labs did not reveal iron deficiency.  Has been taking oral iron.  Constipation. Keeping bowels moving.  Needs f/u Dr Smith Robert.  Hgb in hospital 9.5. Has not received IV iron recently.

## 2023-10-30 NOTE — Assessment & Plan Note (Signed)
Has been seeing Dr Gershon Crane for her diabetes and f/u multinodular goiter. She was previously having some intermittent low sugars.  Not taking glipizide now. We discussed remaining off this medication, given the low sugars and her renal function. She did have pancreatitis in the past, so will avoid GLP - 1 agonist. Given her renal function, she is unable to use SGLT-2 and metformin. States sugars averaging 120-160.

## 2023-10-30 NOTE — Telephone Encounter (Signed)
Daughter requested home health PT referral to [facility] home health. Was supposed to be placed at d/c from hospital but niece says referral has not been placed. Referral pended in appt.

## 2023-10-30 NOTE — Assessment & Plan Note (Signed)
Followed by Dr Suezanne Jacquet.  GFR 17 yesterday.

## 2023-10-30 NOTE — Progress Notes (Unsigned)
Subjective:    Patient ID: [name] [name], female    DOB: 03-Jul-1935, 88 y.o.   MRN: [id number]  Patient here for  Chief Complaint  Patient presents with   Medical Management of Chronic Issues    HPI Here for a hospital follow up - admitted 10/19/23 - 10/23/23 - after presenting with sob and chest pain. Was s/p cardioversion for aflutter 10/17/23. Noticed worsening sob 11/14. Cardiology consulted. Diuresed. Echo EF 55-60, normal LV fxn and no RWMA, indeterminate diastolic, severely elevated pulmonary artery pressure. Lasix held secondary to acute kidney injury. Required oxygen initially. Weaned off. Renal function improved and oral diuretics resumed. Back in aflutter 10/21/23. Per note, cardiology planning possible EP evaluation. Was discharged 10/23/23 - to home.  Had f/u with heart failure clinic yesterday.  Weight down. Recommended to continue carvedilol, furosemide, amiodarone.  Met b yesterday - GFR 17. Per note -  started on eliquis with coumadin stopped. She is accompanied by her son.  She reports feeling better than she did during her admission.  Still with sob with exertion.  No increased cough or congestion. No abdominal pain.  Discussed her cpap.  She has had her machine for 7 years. Feels may need a new machine.  Does not feel works as well.  Discussed PT - post hospitalization.  Was recommended after discharge.  Agreeable to home health PT. Continuing on lasix.  Weighing daily. Discussed her hospitalization and echo results and labs from hospitalization.  Blood sugars averaging 120-160. Off glipizide.    Past Medical History:  Diagnosis Date   1st degree AV block    a. PR 400 msec with Apacing   A-fib (HCC)    Hx of   Anemia    Anxiety    Arthritis    knees and lower back    Chest pain    a. 2004 Cath:  reportedly nl;  b. 09/2013 Neg MV; c. 11/2017 nl ETT; d. 08/2023 MV: No isch/infarct. nl LV fxn. Mild Ao Ca2+.   Chronic diastolic heart failure (HCC)    a. 09/2013 Echo: EF  50-55%, mild LVH, mild to mod MR/TR; b. 04/2023 Echo: EF 55-60%, no rwma, nl RV size/fxn, RVSP 44.10mmHg. Mod dil LA. Mod MR/TR.   CKD (chronic kidney disease), stage IV (HCC)    Colon polyps    Congestive heart disease (HCC)    Esophageal reflux    Glaucoma    Gout    H/O hiatal hernia    History of UTI    Hyperlipidemia    Moderate mitral regurgitation    a. 04/2023 Echo: Mod MR.   Morbid obesity (HCC)    OSA (obstructive sleep apnea)    OSA on CPAP    CPAP   Pacemaker    copy of medtronic card in chart   PAF (paroxysmal atrial fibrillation) (HCC)    a. 10/2023 s/p DCCV (150J); b. CHA2DS2VASc = 5-->warfarin.   Primary hypertension    controlled on meds   Pulmonary hypertension (HCC)    a. 04/2023 Echo: RVSP 44.65mmHg.   Seasonal allergies    SSS (sick sinus syndrome) (HCC)    a. 2008 s/p MDT PPM;  b. 11/2013 Gen change- MDT ADDRL1 Adapta DC PPM, ser # [id number] H.   Symptomatic PVCs    Syncope and collapse    a. Felt to be vasovagal.   Thyroid disease    nodules on thyroid, with goiter, on no meds per pt   Tricuspid regurgitation    Type  II diabetes mellitus (HCC)    Type 2   Unspecified glaucoma(365.9)    Vertigo    in past   Wears dentures    upper full and lower partial   Past Surgical History:  Procedure Laterality Date   APPENDECTOMY     CARDIAC CATHETERIZATION     CARDIOVERSION N/A 04/26/2017   Procedure: Cardioversion;  Surgeon: Antonieta Iba, MD;  Location: [facility];  Service: Cardiovascular;  Laterality: N/A;   CARDIOVERSION N/A 10/17/2023   Procedure: CARDIOVERSION;  Surgeon: Antonieta Iba, MD;  Location: [facility];  Service: Cardiovascular;  Laterality: N/A;   CATARACT EXTRACTION W/PHACO Left 07/21/2020   Procedure: CATARACT EXTRACTION PHACO AND INTRAOCULAR LENS PLACEMENT (IOC) LEFT DIABETIC;  Surgeon: Lockie Mola, MD;  Location: [facility];  Service: Ophthalmology;  Laterality: Left;  10.25 1:13.2 14.0%   CATARACT EXTRACTION W/PHACO  Right 08/11/2020   Procedure: CATARACT EXTRACTION PHACO AND INTRAOCULAR LENS PLACEMENT (IOC) RIGHT DIABETIC;  Surgeon: Lockie Mola, MD;  Location: [facility];  Service: Ophthalmology;  Laterality: Right;  11.63 1:25.0 13.7%   DILATION AND CURETTAGE OF UTERUS     INCISION AND DRAINAGE OF WOUND Right 03/1998   "leg; it was like a boil" (12/01/2013)   INSERT / REPLACE / REMOVE PACEMAKER  2008; 12/01/2013   MDT ADDRL1 pacemaker - gen change by Dr Graciela Husbands 12/01/2013   LUNG BIOPSY  2010   unc   PERMANENT PACEMAKER GENERATOR CHANGE N/A 12/01/2013   Procedure: PERMANENT PACEMAKER GENERATOR CHANGE;  Surgeon: Duke Salvia, MD;  Location: [facility] C[facility];  Service: Cardiovascular;  Laterality: N/A;   RIGHT HEART CATH N/A 10/23/2023   Procedure: RIGHT HEART CATH;  Surgeon: Dorthula Nettles, DO;  Location: [facility] INVASIVE CV LAB;  Service: Cardiovascular;  Laterality: N/A;   VAGINAL HYSTERECTOMY  1970   Family History  Problem Relation Age of Onset   Cancer Mother    Liver cancer Mother    Pancreatic cancer Mother    Heart disease Father    Rheum arthritis Father    Allergies Father    Hypertension Sister    Rheum arthritis Sister    COPD Brother    Breast cancer Maternal Aunt    Social History   Socioeconomic History   Marital status: Widowed    Spouse name: Not on file   Number of children: 2   Years of education: Not on file   Highest education level: 12th grade  Occupational History   Occupation: retired  Tobacco Use   Smoking status: Former    Current packs/day: 0.00    Average packs/day: 0.5 packs/day for 34.0 years (17.0 ttl pk-yrs)    Types: Cigarettes    Start date: 08/16/1955    Quit date: 08/15/1989    Years since quitting: 34.2   Smokeless tobacco: Never  Vaping Use   Vaping status: Never Used  Substance and Sexual Activity   Alcohol use: No    Alcohol/week: 0.0 standard drinks of alcohol   Drug use: No   Sexual activity: Never  Other Topics Concern    Not on file  Social History Narrative   Retired. Widowed. Regularly exercises.    Social Determinants of Health   Financial Resource Strain: Low Risk  (10/22/2023)   Overall Financial Resource Strain (CARDIA)    Difficulty of Paying Living Expenses: Not hard at all  Food Insecurity: Patient Declined (10/19/2023)   Hunger Vital Sign    Worried About Running Out of Food in the Last  Year: Patient declined    Barista in the Last Year: Patient declined  Transportation Needs: No Transportation Needs (10/22/2023)   PRAPARE - Administrator, Civil Service (Medical): No    Lack of Transportation (Non-Medical): No  Physical Activity: Unknown (12/01/2018)   Exercise Vital Sign    Days of Exercise per Week: Patient declined    Minutes of Exercise per Session: Patient declined  Stress: Unknown (12/01/2018)   [employer] of Occupational Health - Occupational Stress Questionnaire    Feeling of Stress : Patient declined  Social Connections: Unknown (12/01/2018)   Social Connection and Isolation Panel [NHANES]    Frequency of Communication with Friends and Family: Patient declined    Frequency of Social Gatherings with Friends and Family: Patient declined    Attends Religious Services: Patient declined    Database administrator or Organizations: Patient declined    Attends Banker Meetings: Patient declined    Marital Status: Patient declined     Review of Systems  Constitutional:  Positive for fatigue. Negative for appetite change and unexpected weight change.  HENT:  Negative for congestion and sinus pressure.   Respiratory:  Negative for cough and chest tightness.        Some sob with exertion.   Cardiovascular:  Negative for chest pain and palpitations.       Leg swelling has improved.   Gastrointestinal:  Negative for abdominal pain, diarrhea, nausea and vomiting.  Genitourinary:  Negative for difficulty urinating and dysuria.  Musculoskeletal:   Negative for joint swelling and myalgias.  Skin:  Negative for color change and rash.  Neurological:  Negative for dizziness and headaches.  Psychiatric/Behavioral:  Negative for agitation and dysphoric mood.        Objective:     BP 128/72   Pulse 75   Temp 98.2 F (36.8 C)   Resp 16   Ht 5\' 4"  (1.626 m)   Wt 238 lb (108 kg)   SpO2 98%   BMI 40.85 kg/m  Wt Readings from Last 3 Encounters:  10/30/23 238 lb (108 kg)  10/29/23 241 lb (109.3 kg)  10/21/23 243 lb 8 oz (110.5 kg)    Physical Exam Vitals reviewed.  Constitutional:      General: She is not in acute distress.    Appearance: Normal appearance.  HENT:     Head: Normocephalic and atraumatic.     Right Ear: External ear normal.     Left Ear: External ear normal.  Eyes:     General: No scleral icterus.       Right eye: No discharge.        Left eye: No discharge.     Conjunctiva/sclera: Conjunctivae normal.  Neck:     Thyroid: No thyromegaly.  Cardiovascular:     Rate and Rhythm: Normal rate and regular rhythm.  Pulmonary:     Effort: No respiratory distress.     Breath sounds: Normal breath sounds. No wheezing.  Abdominal:     General: Bowel sounds are normal.     Palpations: Abdomen is soft.     Tenderness: There is no abdominal tenderness.  Musculoskeletal:        General: No tenderness.     Cervical back: Neck supple. No tenderness.     Comments: Leg swelling - improved.  Compression socks in place.   Lymphadenopathy:     Cervical: No cervical adenopathy.  Skin:    Findings: No erythema  or rash.  Neurological:     Mental Status: She is alert.  Psychiatric:        Mood and Affect: Mood normal.        Behavior: Behavior normal.      Outpatient Encounter Medications as of 10/30/2023  Medication Sig   acetaminophen (TYLENOL) 500 MG tablet Take 1,000 mg by mouth every 6 (six) hours as needed for moderate pain.    allopurinol (ZYLOPRIM) 100 MG tablet Take 100 mg by mouth daily.   amiodarone  (PACERONE) 200 MG tablet TAKE 1 TABLET BY MOUTH EVERY DAY   amLODipine (NORVASC) 5 MG tablet Take 1 tablet (5 mg total) by mouth daily.   apixaban (ELIQUIS) 2.5 MG TABS tablet Take 1 tablet (2.5 mg total) by mouth 2 (two) times daily.   atorvastatin (LIPITOR) 20 MG tablet TAKE 1 TABLET (20 MG TOTAL) BY MOUTH DAILY.   Blood Glucose Monitoring Suppl (FIFTY50 GLUCOSE METER 2.0) w/Device KIT    calcitRIOL (ROCALTROL) 0.25 MCG capsule TAKE 1 CAPSULE (0.25 MCG TOTAL) BY MOUTH 1 (ONE) TIME EACH DAY   carvedilol (COREG) 25 MG tablet Take 1-2 tablets (25-50 mg total) by mouth as directed. Take 1 tablet (25MG ) by mouth every morning and 2 tablets (50MG ) by mouth every night before bed (Patient taking differently: Take 25-50 mg by mouth See admin instructions. Take 1 tablet (25MG ) by mouth every morning and 2 tablets (50MG ) by mouth every night before bed)   ferrous sulfate 325 (65 FE) MG EC tablet Take 325 mg by mouth daily.    furosemide (LASIX) 40 MG tablet Take 40 mg by mouth daily.   glipiZIDE (GLUCOTROL) 10 MG tablet Take 10 mg by mouth daily before breakfast.   Glycerin, Laxative, (FLEET LIQUID GLYCERIN SUPP RE) Place 1 Dose rectally daily as needed (constipation).   KLOR-CON M10 10 MEQ tablet Take 10 mEq by mouth daily.   meclizine (ANTIVERT) 25 MG tablet Take 25 mg by mouth 3 (three) times daily as needed for dizziness or nausea.   Multiple Vitamins-Minerals (CENTRUM SILVER PO) Take 1 tablet by mouth daily.    OneTouch Delica Lancets 33G MISC    ONETOUCH VERIO test strip 3 (three) times daily.   pantoprazole (PROTONIX) 40 MG tablet Take 40 mg by mouth daily.   polyethylene glycol (MIRALAX / GLYCOLAX) packet Take 17 g by mouth daily as needed for moderate constipation.   sitaGLIPtin (JANUVIA) 25 MG tablet Take 1 tablet (25 mg total) by mouth daily.   White Petrolatum-Mineral Oil (ARTIFICIAL TEARS) ointment Place 1 drop into both eyes daily as needed (Dry eyes).   No facility-administered encounter  medications on file as of 10/30/2023.     Lab Results  Component Value Date   WBC 6.6 10/22/2023   HGB 9.5 (L) 10/23/2023   HCT 28.0 (L) 10/23/2023   PLT 127 (L) 10/22/2023   GLUCOSE 181 (H) 10/29/2023   CHOL 156 11/01/2016   TRIG 93 11/01/2016   HDL 58 11/01/2016   LDLCALC 79 11/01/2016   ALT 45 (H) 10/19/2023   AST 56 (H) 10/19/2023   NA 142 10/29/2023   K 4.4 10/29/2023   CL 103 10/29/2023   CREATININE 2.65 (H) 10/29/2023   BUN 23 10/29/2023   CO2 22 10/29/2023   TSH 0.270 (L) 10/22/2023   INR 2.2 (H) 10/23/2023   HGBA1C 7.2 09/26/2023    ECHOCARDIOGRAM COMPLETE  Result Date: 10/20/2023    ECHOCARDIOGRAM REPORT   Patient Name:   SHAE-LYNN CHILA  Date of Exam: 10/20/2023 Medical Rec #:  [id number]    Height:       64.0 in Accession #:    [id number]   Weight:       239.4 lb Date of Birth:  Jan 28, 1935   BSA:          2.112 m Patient Age:    87 years     BP:           130/64 mmHg Patient Gender: F            HR:           60 bpm. Exam Location:  [facility] Procedure: 2D Echo Indications:     CHF I50.31  History:         Patient has prior history of Echocardiogram examinations, most                  recent 04/25/2023.  Sonographer:     Overton Mam RDCS, FASE Referring Phys:  [id number] Sunnie Nielsen Diagnosing Phys: [name] MD IMPRESSIONS  1. Left ventricular ejection fraction, by estimation, is 55 to 60%. The left ventricle has normal function. The left ventricle has no regional wall motion abnormalities. Left ventricular diastolic parameters are indeterminate.  2. Right ventricular systolic function is low normal. The right ventricular size is mildly enlarged. There is severely elevated pulmonary artery systolic pressure. The estimated right ventricular systolic pressure is 62.8 mmHg.  3. Left atrial size was mildly dilated.  4. The mitral valve is normal in structure. No evidence of mitral valve regurgitation. No evidence of mitral stenosis.  5. Tricuspid valve regurgitation is  moderate.  6. The aortic valve is normal in structure. Aortic valve regurgitation is not visualized. No aortic stenosis is present.  7. The inferior vena cava is normal in size with greater than 50% respiratory variability, suggesting right atrial pressure of 3 mmHg. FINDINGS  Left Ventricle: Left ventricular ejection fraction, by estimation, is 55 to 60%. The left ventricle has normal function. The left ventricle has no regional wall motion abnormalities. The left ventricular internal cavity size was normal in size. There is  no left ventricular hypertrophy. Left ventricular diastolic parameters are indeterminate. Right Ventricle: The right ventricular size is mildly enlarged. No increase in right ventricular wall thickness. Right ventricular systolic function is low normal. There is severely elevated pulmonary artery systolic pressure. The tricuspid regurgitant velocity is 3.80 m/s, and with an assumed right atrial pressure of 5 mmHg, the estimated right ventricular systolic pressure is 62.8 mmHg. Left Atrium: Left atrial size was mildly dilated. Right Atrium: Right atrial size was normal in size. Pericardium: There is no evidence of pericardial effusion. Mitral Valve: The mitral valve is normal in structure. No evidence of mitral valve regurgitation. No evidence of mitral valve stenosis. Tricuspid Valve: The tricuspid valve is normal in structure. Tricuspid valve regurgitation is moderate . No evidence of tricuspid stenosis. Aortic Valve: The aortic valve is normal in structure. Aortic valve regurgitation is not visualized. No aortic stenosis is present. Aortic valve peak gradient measures 9.7 mmHg. Pulmonic Valve: The pulmonic valve was normal in structure. Pulmonic valve regurgitation is mild. No evidence of pulmonic stenosis. Aorta: The aortic root is normal in size and structure. Venous: The inferior vena cava is normal in size with greater than 50% respiratory variability, suggesting right atrial pressure of  3 mmHg. IAS/Shunts: No atrial level shunt detected by color flow Doppler. Additional Comments: A device lead is visualized.  LEFT VENTRICLE  PLAX 2D LVIDd:         4.10 cm   Diastology LVIDs:         2.90 cm   LV e' medial:    13.40 cm/s LV PW:         1.30 cm   LV E/e' medial:  9.2 LV IVS:        1.00 cm   LV e' lateral:   14.10 cm/s LVOT diam:     1.80 cm   LV E/e' lateral: 8.7 LV SV:         55 LV SV Index:   26 LVOT Area:     2.54 cm  RIGHT VENTRICLE RV Basal diam:  3.00 cm RV S prime:     16.60 cm/s TAPSE (M-mode): 2.1 cm LEFT ATRIUM           Index        RIGHT ATRIUM           Index LA diam:      4.90 cm 2.32 cm/m   RA Area:     17.40 cm LA Vol (A2C): 62.0 ml 29.36 ml/m  RA Volume:   43.30 ml  20.50 ml/m LA Vol (A4C): 67.1 ml 31.77 ml/m  AORTIC VALVE                 PULMONIC VALVE AV Area (Vmax): 1.84 cm     PV Vmax:          0.96 m/s AV Vmax:        156.00 cm/s  PV Peak grad:     3.7 mmHg AV Peak Grad:   9.7 mmHg     PR End Diast Vel: 6.45 msec LVOT Vmax:      113.00 cm/s LVOT Vmean:     75.000 cm/s LVOT VTI:       0.218 m  AORTA Ao Root diam: 3.00 cm Ao Asc diam:  3.00 cm MITRAL VALVE                TRICUSPID VALVE MV Area (PHT): 3.34 cm     TR Peak grad:   57.8 mmHg MV Decel Time: 227 msec     TR Vmax:        380.00 cm/s MV E velocity: 123.00 cm/s MV A velocity: 35.10 cm/s   SHUNTS MV E/A ratio:  3.50         Systemic VTI:  0.22 m                             Systemic Diam: 1.80 cm [name] MD Electronically signed by [name] MD Signature Date/Time: 10/20/2023/3:16:40 PM    Final        Assessment & Plan:  There are no diagnoses linked to this encounter.   Dale Kendall, MD

## 2023-10-31 ENCOUNTER — Encounter: Payer: Self-pay | Admitting: Oncology

## 2023-10-31 NOTE — Assessment & Plan Note (Signed)
Followed by Dr Suezanne Jacquet.

## 2023-10-31 NOTE — Assessment & Plan Note (Signed)
Discussed her cpap.  She has had her machine for 7 years. Feels may need a new machine.  Does not feel is working as well for her.  Discussed f/u with pulmonary for reevaluation and question of need for HST.

## 2023-10-31 NOTE — Assessment & Plan Note (Signed)
Recently admitted for worsening sob as outlined. Doing some better now.  On oral lasix. Dicussed importance of weighing herself daily.  Weight is down. Call with update.

## 2023-10-31 NOTE — Assessment & Plan Note (Signed)
Has f/u with cardiology.

## 2023-10-31 NOTE — Assessment & Plan Note (Signed)
Lower extremity swelling improved.

## 2023-10-31 NOTE — Assessment & Plan Note (Signed)
Continues on carvedilol, amlodipine and furosemide.  No changes today.  Follow pressures.

## 2023-10-31 NOTE — Assessment & Plan Note (Signed)
Continues on amiodarone.  Now off coumadin.  Started eliquis.  Heart rhythm appears regular and rate controlled today.  Follow.

## 2023-10-31 NOTE — Assessment & Plan Note (Signed)
On lipitor.  Low cholesterol diet and exercise.  Follow lipid panel and liver function tests.   

## 2023-11-06 ENCOUNTER — Telehealth: Payer: Self-pay

## 2023-11-06 DIAGNOSIS — H43823 Vitreomacular adhesion, bilateral: Secondary | ICD-10-CM | POA: Diagnosis not present

## 2023-11-06 DIAGNOSIS — H26492 Other secondary cataract, left eye: Secondary | ICD-10-CM | POA: Diagnosis not present

## 2023-11-06 DIAGNOSIS — H35033 Hypertensive retinopathy, bilateral: Secondary | ICD-10-CM | POA: Diagnosis not present

## 2023-11-06 DIAGNOSIS — H35361 Drusen (degenerative) of macula, right eye: Secondary | ICD-10-CM | POA: Diagnosis not present

## 2023-11-06 DIAGNOSIS — H401132 Primary open-angle glaucoma, bilateral, moderate stage: Secondary | ICD-10-CM | POA: Diagnosis not present

## 2023-11-06 DIAGNOSIS — E113293 Type 2 diabetes mellitus with mild nonproliferative diabetic retinopathy without macular edema, bilateral: Secondary | ICD-10-CM | POA: Diagnosis not present

## 2023-11-06 DIAGNOSIS — H353221 Exudative age-related macular degeneration, left eye, with active choroidal neovascularization: Secondary | ICD-10-CM | POA: Diagnosis not present

## 2023-11-06 NOTE — Telephone Encounter (Signed)
Patient's niece, Ashok Norris, called to state patient recently saw Dr. Dale [place] and was referred for home health.  Annice Pih states [facility] has not received the referral from [place].  Annice Pih states their fax number is [phone].

## 2023-11-06 NOTE — Telephone Encounter (Signed)
Patients niece following up on [facility] referral.

## 2023-11-07 ENCOUNTER — Ambulatory Visit: Payer: PPO | Attending: Cardiology | Admitting: Cardiology

## 2023-11-07 ENCOUNTER — Encounter: Payer: Self-pay | Admitting: Cardiology

## 2023-11-07 VITALS — BP 130/60 | HR 61 | Ht 64.0 in | Wt 237.0 lb

## 2023-11-07 DIAGNOSIS — I10 Essential (primary) hypertension: Secondary | ICD-10-CM

## 2023-11-07 DIAGNOSIS — N184 Chronic kidney disease, stage 4 (severe): Secondary | ICD-10-CM | POA: Diagnosis not present

## 2023-11-07 DIAGNOSIS — E782 Mixed hyperlipidemia: Secondary | ICD-10-CM | POA: Diagnosis not present

## 2023-11-07 DIAGNOSIS — E1122 Type 2 diabetes mellitus with diabetic chronic kidney disease: Secondary | ICD-10-CM | POA: Diagnosis not present

## 2023-11-07 DIAGNOSIS — I480 Paroxysmal atrial fibrillation: Secondary | ICD-10-CM

## 2023-11-07 DIAGNOSIS — I495 Sick sinus syndrome: Secondary | ICD-10-CM

## 2023-11-07 DIAGNOSIS — I5032 Chronic diastolic (congestive) heart failure: Secondary | ICD-10-CM

## 2023-11-07 NOTE — Progress Notes (Unsigned)
Synopsis: Referred in by Dale , MD   Subjective:   PATIENT ID: [name] [name] GENDER: female DOB: 1935/08/22, MRN: [id number]  No chief complaint on file.   HPI [name] [name] is an 88 year old female patient with a past medical history of anemia of chronic disease, first-degree AV block status post pacemaker, A-fib on Coumadin, CKD stage IV, type 2 diabetes mellitus, OSA on CPAP.  Presenting to the pulmonary clinic for evaluation of shortness of breath and abnormal PFTs.  She reports shortness of breath and fatigue since March of this year.  Describes it mostly on exertion even turning over in bed will make her gasp for air.  She denies any cough or sputum production.  She denies any chest pain.  She denies any wheezing or chest tightness.  She denies any loss of appetite or any weight loss.  She does have a history of allergic rhinitis and has a history of OSA on CPAP.  She underwent an echocardiogram as outpatient in May of this year that showed an EF of 55 to 60%, moderate mitral valve regurgitation, mildly elevated pulmonary arterial pressure and moderate tricuspid regurgitation.  Which was not changed from prior studies.    She also underwent PFTs that showed mild restriction with decreased TLC at 76% of predicted.  Mild restrictive pattern on spirometry without obstruction however severely reduced DLCO of 22% of predicted not corrected to hemoglobin. HRCT did not show any signs of Amiodarone pulmonary toxicity.   Hospitalized from 10/19/2023 - 10/23/2023 for chf exacerbation. RHC PA 45/15 (28) PCWP 13. PVR 2.4 W.U. TPG 15 and DPG 2.  Family history -she denies any family history of lung disease.  Social history -she smoked half pack per day for 20 years quit smoking in 1990.  She denies alcohol use worked in [employer].  Has 2 sons no pets at home.  ROS All systems were reviewed and are negative except for the above.  Objective:   There were no vitals filed for this  visit.    on RA BMI Readings from Last 3 Encounters:  11/07/23 40.68 kg/m  10/30/23 40.85 kg/m  10/29/23 41.37 kg/m   Wt Readings from Last 3 Encounters:  11/07/23 237 lb (107.5 kg)  10/30/23 238 lb (108 kg)  10/29/23 241 lb (109.3 kg)    Physical Exam GEN: NAD, Obese HEENT: Supple Neck, Reactive Pupils, EOMI  CVS: irregularly irregular rhythm, normal rate, systolic murmur heard at the mitral site Lungs: Clear bilateral air entry.  Abdomen: Soft, non tender, non distended, + BS  Extremities: Warm and well perfused, +1 LE edema Skin: No suspicious lesions appreciated  Psych: Normal Affect  Ancillary Information   CBC    Component Value Date/Time   WBC 6.6 10/22/2023 0518   RBC 3.54 (L) 10/22/2023 0518   HGB 9.5 (L) 10/23/2023 1152   HGB 9.4 (L) 04/18/2017 0945   HCT 28.0 (L) 10/23/2023 1152   HCT 30.6 (L) 04/18/2017 0945   PLT 127 (L) 10/22/2023 0518   PLT 222 04/18/2017 0945   MCV 76.8 (L) 10/22/2023 0518   MCV 70 (L) 04/18/2017 0945   MCV 70 (L) 08/03/2014 1525   MCH 26.8 10/22/2023 0518   MCHC 34.9 10/22/2023 0518   RDW 14.9 10/22/2023 0518   RDW 19.6 (H) 04/18/2017 0945   RDW 16.6 (H) 08/03/2014 1525   LYMPHSABS 1.3 10/19/2023 0048   LYMPHSABS 2.0 11/24/2013 1057   LYMPHSABS 2.2 09/24/2013 0556   MONOABS 0.5 10/19/2023 0048  MONOABS 0.7 09/24/2013 0556   EOSABS 0.1 10/19/2023 0048   EOSABS 0.2 11/24/2013 1057   EOSABS 0.2 09/24/2013 0556   BASOSABS 0.0 10/19/2023 0048   BASOSABS 0.0 11/24/2013 1057   BASOSABS 0.0 09/24/2013 0556    Imaging  CT soft tissue neck in 2020: Shows the apices of fluid in the lung without any concerning findings.  Also showed patulous esophagus.  Echocardiogram May 2024: LVEF 55 to 60% with normal diastolic parameters.  RV systolic function is normal.  Mildly elevated pulmonary artery systolic pressure measuring at 45 mmHg.  Mean moderate mitral valve regurgitation.  PFTs July 2024 FVC 1.74l (LLM 1.69) 72% of Predicted,  FEV-1 1.49L (LLN 1.18) 84%of predicted. FEV-1 to FVC ratio 86. TLC 3.86 (LLN 3.98) 76% of predicted. DLCO 22% of predicted not corrected to patients hemoglobin.      Latest Ref Rng & Units 06/12/2023    1:32 PM  PFT Results  FVC-Pre L 1.74   FVC-Predicted Pre % 72   FVC-Post L 1.75   FVC-Predicted Post % 73   Pre FEV1/FVC % % 86   Post FEV1/FCV % % 87   FEV1-Pre L 1.49   FEV1-Predicted Pre % 84   FEV1-Post L 1.52   DLCO uncorrected ml/min/mmHg 4.21   DLCO UNC% % 22   DLVA Predicted % 71   TLC L 3.87   TLC % Predicted % 76   RV % Predicted % 73      Assessment & Plan:  [name] [name] is an 88 year old female patient with a past medical history of anemia of chronic disease, first-degree AV block status post pacemaker, A-fib on Coumadin, CKD stage IV, type 2 diabetes mellitus, OSA on CPAP.  Presenting to the pulmonary clinic for evaluation of shortness of breath and abnormal PFTs.  #Dyspnea on exertion  Likely secondary to her HFpEF and Gp II and III PH. DLCO was 22 on her last PFTs HRCT without any sings of ILD. RHC with mildly elevated PVR. Mean PA 28. DPG of 2 is suggestive of isolated post cap PH. There is some degree of remodelling and possibly a compnent of gp III due to OSA.   Mainstay of treatment will be optimization of volume status and treatment of her underlying OSA.   [] Repeat PFTs in 6 months  []  C/w CPAP for OSA.  []  Continue with volume optimization per cardiology (Dr. Mariah Milling).  []  Advised on importance of weight loss. []  Pending home rehab  #OSA compliant with CPAP.   #Paroxysmal A.fib on coumadin  #Moderate mitral valve regurgitation  #1st degree AV block s/p pacemaker  #Hypertension Currently on Coumadin with therapeutic INR. On Amiodarone 200mg  PO daily  On Lasix 40 mg p.o. daily.  On carvedilol 25 mg in the morning and 50 mg at night. On Amlodipine 5mg  daily.   #Type II DM  - On Glipizide and Sitagliptin.   RTC 6 months .  I spent 60 minutes caring for  this patient today, including preparing to see the patient, obtaining a medical history , reviewing a separately obtained history, performing a medically appropriate examination and/or evaluation, counseling and educating the patient/family/caregiver, ordering medications, tests, or procedures, documenting clinical information in the electronic health record, and independently interpreting results (not separately reported/billed) and communicating results to the patient/family/caregiver  Janann Colonel, MD [facility] 11/07/2023 9:28 PM

## 2023-11-07 NOTE — Patient Instructions (Signed)
Medication Instructions:  - No changes *If you need a refill on your cardiac medications before your next appointment, please call your pharmacy*  Lab Work: - None ordered  Testing/Procedures: - None ordered  Follow-Up: At [facility], you and your health needs are our priority.  As part of our continuing mission to provide you with exceptional heart care, we have created designated Provider Care Teams.  These Care Teams include your primary Cardiologist (physician) and Advanced Practice Providers (APPs -  Physician Assistants and Nurse Practitioners) who all work together to provide you with the care you need, when you need it.  Your next appointment:   3 month(s)  Provider:   Charlsie Quest, NP

## 2023-11-07 NOTE — Progress Notes (Signed)
Cardiology Office Note:  .   Date:  11/07/2023  ID:  [name], DOB Mar 20, 1935, MRN [id number] PCP: Dale [place], MD  [facility] Providers Cardiologist:  [name], MD Electrophysiologist:  Lanier Prude, MD    History of Present Illness: Marland Kitchen   [name] [name] is a 88 y.o. female with a past medical history of paroxysmal atrial fibrillation on chronic anticoagulation, chronic chest pain with normal coronaries on remote cath, hypertension, hyperlipidemia, type 2 diabetes, CKD stage IV, chronic HFpEF, pulmonary hypertension, moderate MR/TR, symptomatic PVCs, OSA on CPAP, and sick sinus syndrome status post MDT PPM, who is here today after recent hospitalization.   Myoview stress testing completed in October 2020 showed no significant ischemia with normal ejection fraction. Echocardiogram completed May 2022 showed normal LV SF, moderate TR, mild to moderate MR, mild PR, and EF of 55 to 60%.  05/21/2023 her device interrogation was completed by EP at that time he was doing well she continued to have shortness of breath she was scheduled for PFTs.  Echocardiogram completed 04/2023 revealed LVEF of 55 to 60%, no RWMA, moderate mitral valve regurgitation, moderate tricuspid regurgitation.  She continued to have complaints of shortness of breath, fatigue, peripheral edema without weight gain.  Stated that her symptoms have been ongoing since March 2024.  She had been ordered PFTs through pulmonary sleep and concerned about her antiarrhythmic causing her shortness of breath.  PFTs were reviewed and showed severely reduced DLCO out of proportion to the degree of restriction which raises a concern for pulmonary vascular disease likely group 2 in the setting of her moderate mitral valve regurgitation and group 3 in the setting of OSA.  Findings were considered consistent with patients with combined restrictive and obstructive pulmonary disease which was thought to be less likely.  She was scheduled  for CT of the chest to rule out amiodarone toxicity and was advised that her CT results did not show any signs of amiodarone pulmonary toxicity she was scheduled for thyroid function studies.She was evaluated in clinic 08/17/2023 with continued shortness of breath was noted to have extremely prolonged QT interval.  EP was consulted during her visit and the patient's PR interval was shortened on her device and follow-up was made with a PE.  She was last seen in clinic 10/08/23 with Dr. Mariah Milling.  At that point she was feeling her heart beating in her ears, extreme fatigue, shortness of breath with little to no activity, bilateral lower extremity edema, and chest heaviness that radiates up into her neck happens with exertion.  At that time she was recommended for cardioversion as she had converted into atrial flutter approximately 37 days ago persistent with worsening symptoms.  Cardioversion was planned for 10/17/2023.  She was restored to normal sinus rhythm with AV pacing.   She presented to [facility] emergency department on 10/19/2023 with worsening dyspnea and associated midsternal chest pain felt chest pressure described 10 out of 10 in severity.  She admits to a dry cough and wheezing.  She admits to orthopnea and paroxysmal nocturnal dyspnea as well as worsening lower extremity edema.  She had a new O2 requirement likely related to her CHF and community-acquired pneumonia.  She was started on diuresis and antibiotics.  Antibiotics were discontinued as it did not feel there was strong clinical suspicion for Repeat echocardiogram was ordered.  She continued to be diuresed with lung sounds improving but her creatinine was worse so diuretics had to be held.  Echocardiogram  revealed an LVEF of 55 to 60%, normal LV function and no RWMA.  Severely elevated pulmonary artery pressure.  On 10/21/2023 she went back into atrial flutter overnight EP was consulted for evaluation as well as advanced heart failure as she still had  significant dyspnea on exertion.  She was scheduled for right heart catheterization to better understand hemodynamics and her symptoms of shortness of breath.  She underwent right heart catheterization on 10/23/2023 which revealed normal pre and postcapillary filling pressure, mildly elevated PA mean PVR likely secondary to HFpEF, normal cardiac output/index.  Considered stable was able to be discharged on 10/23/2023 with continued follow-up with EP, cardiology, and advanced heart failure.  She returns to clinic today stating that she continues to have some shortness of breath but feels that it is slowly improving. Denies any chest pain, palpitations, lightheadedness or dizziness.  She occasionally has swelling to her bilateral lower extremities but continues to remain improved since her hospital discharge.  She denies any bleeding or noted blood in her urine or stools since being transition from warfarin to apixaban.  She just recently had labs drawn with her follow-up with advanced heart failure clinic and noted stable electrolytes and kidney function that was back to baseline.  She states that she has been compliant with her current medication regimen.  ROS: 10 point review of systems has been reviewed and considered negative with exception was been listed in HPI  Studies Reviewed: Marland Kitchen   EKG Interpretation Date/Time:  Wednesday November 07 2023 10:01:11 EST Ventricular Rate:  61 PR Interval:    QRS Duration:  192 QT Interval:  530 QTC Calculation: 533 R Axis:   -62  Text Interpretation: Ventricular-paced rhythm When compared with ECG of 29-Oct-2023 09:21, No significant change was found Confirmed by Charlsie Quest ([id number]) on 11/07/2023 10:09:26 AM    RHC 10/23/2023 IMPRESSION: Normal pre and post capillary filling pressures.  Mildly elevated PA mean and PVR likely secondary to HFpEF Normal cardiac output / index.   2D echo 10/20/2023 1. Left ventricular ejection fraction, by estimation, is  55 to 60%. The  left ventricle has normal function. The left ventricle has no regional  wall motion abnormalities. Left ventricular diastolic parameters are  indeterminate.   2. Right ventricular systolic function is low normal. The right  ventricular size is mildly enlarged. There is severely elevated pulmonary  artery systolic pressure. The estimated right ventricular systolic  pressure is 62.8 mmHg.   3. Left atrial size was mildly dilated.   4. The mitral valve is normal in structure. No evidence of mitral valve  regurgitation. No evidence of mitral stenosis.   5. Tricuspid valve regurgitation is moderate.   6. The aortic valve is normal in structure. Aortic valve regurgitation is  not visualized. No aortic stenosis is present.   7. The inferior vena cava is normal in size with greater than 50%  respiratory variability, suggesting right atrial pressure of 3 mmHg.   TTE 04/25/23 1. Left ventricular ejection fraction, by estimation, is 55 to 60%. The  left ventricle has normal function. The left ventricle has no regional  wall motion abnormalities. Left ventricular diastolic parameters were  normal.   2. Right ventricular systolic function is normal. The right ventricular  size is normal. There is mildly elevated pulmonary artery systolic  pressure. The estimated right ventricular systolic pressure is 44.7 mmHg.   3. Left atrial size was moderately dilated.   4. The mitral valve is normal in structure. Moderate mitral  valve  regurgitation. No evidence of mitral stenosis.   5. Tricuspid valve regurgitation is moderate.   6. The aortic valve is tricuspid. Aortic valve regurgitation is not  visualized. No aortic stenosis is present.   7. The inferior vena cava is normal in size with greater than 50%  respiratory variability, suggesting right atrial pressure of 3 mmHg.    TTE 12/18/18 Left ventricle: The cavity size was normal. Wall thickness was    increased in a pattern of mild LVH.  Systolic function was normal.    The estimated ejection fraction was in the range of 55% to 65%.  - Mitral valve: There was mild regurgitation.  - Left atrium: The atrium was mildly dilated.  Risk Assessment/Calculations:    CHA2DS2-VASc Score = 6   This indicates a 9.7% annual risk of stroke. The patient's score is based upon: CHF History: 1 HTN History: 1 Diabetes History: 1 Stroke History: 0 Vascular Disease History: 0 Age Score: 2 Gender Score: 1            Physical Exam:   VS:  BP 130/60 (BP Location: Left Arm, Patient Position: Sitting, Cuff Size: Large)   Pulse 61   Ht 5\' 4"  (1.626 m)   Wt 237 lb (107.5 kg)   SpO2 98%   BMI 40.68 kg/m    Wt Readings from Last 3 Encounters:  11/07/23 237 lb (107.5 kg)  10/30/23 238 lb (108 kg)  10/29/23 241 lb (109.3 kg)    GEN: Well nourished, well developed in no acute distress NECK: Difficult to assess JVD due to body habitus; No carotid bruits CARDIAC: RRR, no murmurs, rubs, gallops RESPIRATORY:  Clear with diminished bases to auscultation without rales, wheezing or rhonchi, respirations are unlabored at rest on room air ABDOMEN: Soft, non-tender, non-distended EXTREMITIES: Trace pretibial edema; No deformity   ASSESSMENT AND PLAN: .   Chronic HFpEF/NICM with last LVEF of 55 to 60%, no RWMA, severely elevated pulmonary artery systolic pressures, moderate tricuspid regurgitation, with right heart catheterization revealing mildly elevated PA mean.  She continues to have shortness of breath that is slowly improving since recent diuresis during her hospitalization.  She is down an additional 4 pounds since her visit with advanced heart failure clinic on 10/29/2023.  At discontinued on furosemide 40 mg grams daily carvedilol 25 mg in the morning and 50 mg in the evening, Januvia 25 mg daily, renal function does not allow for MRA therapy.  She is encouraged to continue with weighing daily, limiting her sodium and fluid intake.  She has  been encouraged to continue to maintain her follow-up appointments with advanced heart failure.  Paroxysmal atrial fibrillation atrial flutter where she is V paced today on EKG.  She recently underwent cardioversion procedure that was successful.  Warfarin has been changed to apixaban 2.5 mg twice daily as she meets reduced dosing criteria for age and elevated serum creatinine for CHA2DS2-VASc score of at least 6 for stroke prophylaxis she is continued on amiodarone and carvedilol.  Primary hypertension blood pressure today 130/60.  She is continued on amlodipine 5 mg daily, carvedilol, furosemide.  Has been encouraged to continue to monitor blood pressures 1 to 2 hours postmedication administration as well.  Mixed hyperlipidemia with last LDL of 96.  She is continued on 20 mg of atorvastatin.  Chronic kidney disease stage IV with a serum creatinine 2.65, her baseline is around 2.5-2.7.  Previously during recent hospitalization she peaked a little over 3 diuretic therapy had to  be stopped.  Serum creatinine appears to be back to baseline with recent labs.  She also has upcoming labs ordered for Friday of this week.  She continues to be followed by nephrology.  Type 2 diabetes where she is on Januvia.  This continues to be managed by her PCP.  Cardiac pacemaker in situ with a history of sick sinus syndrome and tachybradycardia syndrome.  She is ventricularly paced on EKG today.  She has been encouraged to continue to keep her follow-up appointments with the EP as well.       Dispo: Patient to return to clinic to see MD/APP in 6 months or sooner if needed  Signed, Kimiko Common, NP

## 2023-11-08 ENCOUNTER — Encounter: Payer: Self-pay | Admitting: Pulmonary Disease

## 2023-11-08 ENCOUNTER — Ambulatory Visit: Payer: PPO | Admitting: Pulmonary Disease

## 2023-11-08 VITALS — BP 110/66 | HR 97 | Temp 97.8°F | Ht 64.0 in | Wt 239.0 lb

## 2023-11-08 DIAGNOSIS — G4733 Obstructive sleep apnea (adult) (pediatric): Secondary | ICD-10-CM | POA: Diagnosis not present

## 2023-11-08 DIAGNOSIS — I5032 Chronic diastolic (congestive) heart failure: Secondary | ICD-10-CM | POA: Diagnosis not present

## 2023-11-09 ENCOUNTER — Telehealth: Payer: Self-pay

## 2023-11-09 ENCOUNTER — Other Ambulatory Visit (INDEPENDENT_AMBULATORY_CARE_PROVIDER_SITE_OTHER): Payer: PPO

## 2023-11-09 DIAGNOSIS — Z7901 Long term (current) use of anticoagulants: Secondary | ICD-10-CM | POA: Diagnosis not present

## 2023-11-09 DIAGNOSIS — E782 Mixed hyperlipidemia: Secondary | ICD-10-CM

## 2023-11-09 DIAGNOSIS — N183 Chronic kidney disease, stage 3 unspecified: Secondary | ICD-10-CM

## 2023-11-09 LAB — BASIC METABOLIC PANEL
BUN: 34 mg/dL — ABNORMAL HIGH (ref 6–23)
CO2: 30 meq/L (ref 19–32)
Calcium: 9.6 mg/dL (ref 8.4–10.5)
Chloride: 102 meq/L (ref 96–112)
Creatinine, Ser: 3.14 mg/dL — ABNORMAL HIGH (ref 0.40–1.20)
GFR: 12.97 mL/min — CL (ref >60.00)
Glucose, Bld: 166 mg/dL — ABNORMAL HIGH (ref 70–99)
Potassium: 4 meq/L (ref 3.5–5.1)
Sodium: 142 meq/L (ref 135–145)

## 2023-11-09 LAB — PROTIME-INR
INR: 1.8 {ratio} — ABNORMAL HIGH (ref 0.8–1.0)
Prothrombin Time: 18.4 s — ABNORMAL HIGH (ref 9.6–13.1)

## 2023-11-09 LAB — HEPATIC FUNCTION PANEL
ALT: 18 U/L (ref 0–35)
AST: 20 U/L (ref 0–37)
Albumin: 3.9 g/dL (ref 3.5–5.2)
Alkaline Phosphatase: 65 U/L (ref 39–117)
Bilirubin, Direct: 0.3 mg/dL (ref 0.0–0.3)
Total Bilirubin: 0.9 mg/dL (ref 0.2–1.2)
Total Protein: 6.6 g/dL (ref 6.0–8.3)

## 2023-11-09 LAB — LIPID PANEL
Cholesterol: 150 mg/dL (ref 0–200)
HDL: 49.3 mg/dL (ref >39.00)
LDL Cholesterol: 84 mg/dL (ref 0–99)
NonHDL: 100.32
Total CHOL/HDL Ratio: 3
Triglycerides: 81 mg/dL (ref 0.0–149.0)
VLDL: 16.2 mg/dL (ref 0.0–40.0)

## 2023-11-09 LAB — TSH: TSH: 0.42 u[IU]/mL (ref 0.35–5.50)

## 2023-11-09 NOTE — Telephone Encounter (Signed)
Saa is calling from [facility] with a critical result for patient.  I transferred call to Rita Ohara, LPN.

## 2023-11-09 NOTE — Telephone Encounter (Signed)
GFR 12.97.

## 2023-11-10 NOTE — Telephone Encounter (Signed)
Reviewed history. Has CKD IV.  Sees nephrology. Discussed with nephrology (Dr Suezanne Jacquet).  Request pt have f/u with him soon. Please notify pt that I have talked to her nephrologist and please schedule soon f/u with him.  See result note.

## 2023-11-12 ENCOUNTER — Encounter: Payer: Self-pay | Admitting: Podiatry

## 2023-11-12 ENCOUNTER — Other Ambulatory Visit: Payer: Self-pay | Admitting: Internal Medicine

## 2023-11-12 ENCOUNTER — Ambulatory Visit: Payer: PPO | Admitting: Podiatry

## 2023-11-12 DIAGNOSIS — M79674 Pain in right toe(s): Secondary | ICD-10-CM | POA: Diagnosis not present

## 2023-11-12 DIAGNOSIS — E119 Type 2 diabetes mellitus without complications: Secondary | ICD-10-CM | POA: Diagnosis not present

## 2023-11-12 DIAGNOSIS — M79675 Pain in left toe(s): Secondary | ICD-10-CM | POA: Diagnosis not present

## 2023-11-12 DIAGNOSIS — L84 Corns and callosities: Secondary | ICD-10-CM

## 2023-11-12 DIAGNOSIS — B351 Tinea unguium: Secondary | ICD-10-CM

## 2023-11-12 DIAGNOSIS — K219 Gastro-esophageal reflux disease without esophagitis: Secondary | ICD-10-CM

## 2023-11-12 NOTE — Telephone Encounter (Signed)
See result note.  

## 2023-11-12 NOTE — Telephone Encounter (Signed)
NOTED

## 2023-11-13 ENCOUNTER — Telehealth: Payer: Self-pay

## 2023-11-13 NOTE — Progress Notes (Signed)
  Subjective:  Patient ID: [name] [name], female    DOB: Sep 09, 1935,  MRN: [id number]  Chief Complaint  Patient presents with   Diabetes    "Trim my toenails and calluses.  I'm a Diabetic."    88 y.o. female presents with the above complaint. History confirmed with patient.  Her nails are thickened and elongated and causing pain.  She has 3 calluses that are painful as well.  She reports good blood sugar control and no other new issues  Objective:  Physical Exam: warm, good capillary refill, no trophic changes or ulcerative lesions, normal DP and PT pulses, and normal sensory exam. Left Foot: dystrophic yellowed discolored nail plates with subungual debris and hyperkeratotic lesion submetatarsal 1 and 5 painful Right Foot: dystrophic yellowed discolored nail plates with subungual debris and submetatarsal 4 hyperkeratotic lesion painful  Assessment:   1. Pain due to onychomycosis of toenails of both feet   2. Callus of foot   3. Controlled type 2 diabetes mellitus without complication, without long-term current use of insulin (HCC)   4. Encounter for diabetic foot exam [facility])      Plan:  Patient was evaluated and treated and all questions answered.  Patient educated on diabetes. Discussed proper diabetic foot care and discussed risks and complications of disease. Educated patient in depth on reasons to return to the office immediately should he/she discover anything concerning or new on the feet. All questions answered. Discussed proper shoes as well.   All symptomatic hyperkeratoses were safely debrided with a sterile #15 blade to patient's level of comfort without incident.  Professional debridement is necessary medically due to her diabetes  Discussed the etiology and treatment options for the condition in detail with the patient.  Previous nail debridement has helped.  Recommend debridement today.Lambert Mody and mechanical debridement performed of all painful and mycotic nails today.  Nails debrided in length and thickness using a nail nipper to level of comfort.    Return in about 3 months (around 02/10/2024) for at risk diabetic foot care.

## 2023-11-13 NOTE — Telephone Encounter (Addendum)
LM with niece for patient to call back for labs. Needs to schedule appt with her kidney doctor    ----- Message from Dale Lucas sent at 11/11/2023 10:21 PM EST ----- See phone message also. Kidney function decreased (more than our last check).  Her kidney function has varied some. Seeing nephrology.  I discussed with Dr Suezanne Jacquet.  Recommended f/u appt soon with him. Please call and schedule an appt.INR slightly decreased.  Was on coumadin.  Please confirm with her if she has stopped her coumadin and if now taking eliquis.   Let me know what she is taking.  Thyroid test wnl. Cholesterol ok. Liver function tests wnl.

## 2023-11-14 NOTE — Telephone Encounter (Signed)
Patient stated that her Kidney Dr. called her and made her an appointment.,

## 2023-11-14 NOTE — Telephone Encounter (Signed)
Rx ok'd for protonix and kcl.  Confirmed with her she is taking and confirm directions correct.  Also, discussed her recent labs. She has an appt with Dr Suezanne Jacquet 11/21/23.

## 2023-11-14 NOTE — Telephone Encounter (Signed)
Need to clarify if taking and how taking (potassium and protonix).

## 2023-11-15 NOTE — Telephone Encounter (Signed)
Pt was notified of lab results by Dr Lorin Picket

## 2023-11-16 DIAGNOSIS — E113293 Type 2 diabetes mellitus with mild nonproliferative diabetic retinopathy without macular edema, bilateral: Secondary | ICD-10-CM | POA: Diagnosis not present

## 2023-11-16 DIAGNOSIS — Z961 Presence of intraocular lens: Secondary | ICD-10-CM | POA: Diagnosis not present

## 2023-11-16 DIAGNOSIS — H353221 Exudative age-related macular degeneration, left eye, with active choroidal neovascularization: Secondary | ICD-10-CM | POA: Diagnosis not present

## 2023-11-16 DIAGNOSIS — H26492 Other secondary cataract, left eye: Secondary | ICD-10-CM | POA: Diagnosis not present

## 2023-11-18 DIAGNOSIS — Z7901 Long term (current) use of anticoagulants: Secondary | ICD-10-CM | POA: Diagnosis not present

## 2023-11-18 DIAGNOSIS — Z950 Presence of cardiac pacemaker: Secondary | ICD-10-CM | POA: Diagnosis not present

## 2023-11-18 DIAGNOSIS — D631 Anemia in chronic kidney disease: Secondary | ICD-10-CM | POA: Diagnosis not present

## 2023-11-18 DIAGNOSIS — I480 Paroxysmal atrial fibrillation: Secondary | ICD-10-CM | POA: Diagnosis not present

## 2023-11-18 DIAGNOSIS — I130 Hypertensive heart and chronic kidney disease with heart failure and stage 1 through stage 4 chronic kidney disease, or unspecified chronic kidney disease: Secondary | ICD-10-CM | POA: Diagnosis not present

## 2023-11-18 DIAGNOSIS — N2581 Secondary hyperparathyroidism of renal origin: Secondary | ICD-10-CM | POA: Diagnosis not present

## 2023-11-18 DIAGNOSIS — Z7984 Long term (current) use of oral hypoglycemic drugs: Secondary | ICD-10-CM | POA: Diagnosis not present

## 2023-11-18 DIAGNOSIS — E1122 Type 2 diabetes mellitus with diabetic chronic kidney disease: Secondary | ICD-10-CM | POA: Diagnosis not present

## 2023-11-18 DIAGNOSIS — I5032 Chronic diastolic (congestive) heart failure: Secondary | ICD-10-CM | POA: Diagnosis not present

## 2023-11-18 DIAGNOSIS — H3530 Unspecified macular degeneration: Secondary | ICD-10-CM | POA: Diagnosis not present

## 2023-11-18 DIAGNOSIS — M109 Gout, unspecified: Secondary | ICD-10-CM | POA: Diagnosis not present

## 2023-11-18 DIAGNOSIS — G4733 Obstructive sleep apnea (adult) (pediatric): Secondary | ICD-10-CM | POA: Diagnosis not present

## 2023-11-18 DIAGNOSIS — I4892 Unspecified atrial flutter: Secondary | ICD-10-CM | POA: Diagnosis not present

## 2023-11-18 DIAGNOSIS — F419 Anxiety disorder, unspecified: Secondary | ICD-10-CM | POA: Diagnosis not present

## 2023-11-18 DIAGNOSIS — N184 Chronic kidney disease, stage 4 (severe): Secondary | ICD-10-CM | POA: Diagnosis not present

## 2023-11-18 DIAGNOSIS — Z87891 Personal history of nicotine dependence: Secondary | ICD-10-CM | POA: Diagnosis not present

## 2023-11-18 DIAGNOSIS — H409 Unspecified glaucoma: Secondary | ICD-10-CM | POA: Diagnosis not present

## 2023-11-18 DIAGNOSIS — E042 Nontoxic multinodular goiter: Secondary | ICD-10-CM | POA: Diagnosis not present

## 2023-11-18 DIAGNOSIS — I272 Pulmonary hypertension, unspecified: Secondary | ICD-10-CM | POA: Diagnosis not present

## 2023-11-18 DIAGNOSIS — E785 Hyperlipidemia, unspecified: Secondary | ICD-10-CM | POA: Diagnosis not present

## 2023-11-19 ENCOUNTER — Encounter: Payer: Self-pay | Admitting: Cardiology

## 2023-11-19 ENCOUNTER — Ambulatory Visit: Payer: PPO | Attending: Cardiology | Admitting: Cardiology

## 2023-11-19 ENCOUNTER — Encounter: Payer: PPO | Admitting: Cardiology

## 2023-11-19 VITALS — BP 123/58 | HR 61 | Ht 64.0 in | Wt 243.4 lb

## 2023-11-19 DIAGNOSIS — E1122 Type 2 diabetes mellitus with diabetic chronic kidney disease: Secondary | ICD-10-CM | POA: Diagnosis not present

## 2023-11-19 DIAGNOSIS — I5033 Acute on chronic diastolic (congestive) heart failure: Secondary | ICD-10-CM

## 2023-11-19 DIAGNOSIS — I4892 Unspecified atrial flutter: Secondary | ICD-10-CM

## 2023-11-19 DIAGNOSIS — I480 Paroxysmal atrial fibrillation: Secondary | ICD-10-CM | POA: Diagnosis not present

## 2023-11-19 DIAGNOSIS — I10 Essential (primary) hypertension: Secondary | ICD-10-CM | POA: Diagnosis not present

## 2023-11-19 DIAGNOSIS — I4891 Unspecified atrial fibrillation: Secondary | ICD-10-CM

## 2023-11-19 DIAGNOSIS — N184 Chronic kidney disease, stage 4 (severe): Secondary | ICD-10-CM

## 2023-11-19 DIAGNOSIS — G4733 Obstructive sleep apnea (adult) (pediatric): Secondary | ICD-10-CM | POA: Diagnosis not present

## 2023-11-19 MED ORDER — FUROSEMIDE 20 MG PO TABS: 60.0000 mg | ORAL_TABLET | Freq: Every day | ORAL | 3 refills | Status: DC

## 2023-11-19 NOTE — Progress Notes (Addendum)
ADVANCED HEART FAILURE CLINIC NOTE  Referring Physician: Dale [place], MD  Primary Care: Dale [place], MD  HPI: [name] [name] is a 88 y.o. female with HFpEF, paroxysmal atrial fibrillation, sick sinus syndrome status post permanent pacemaker, hypertension, OSA, CKD stage IV presenting today to establish care.  She establish care with heart failure in November 2024 when she was admitted with dyspnea on exertion and chest pressure.  At that time patient was found to be in atrial fibrillation with volume overload.  Echocardiogram during admission with EF of 55 to 60% but with severely elevated RVSP.  Patient was aggressively diuresed and underwent right heart catheterization which demonstrated preserved cardiac index and PA mean of 28 mmHg.  Today she presents to establish care outpatient.  From a functional standpoint she has been doing fairly well.  Reports that her dyspnea was much improved until the past 4 days when she started to gain weight again.  She believes that she has gained 3 to 4 pounds in the last 24 hours and is now starting to become short of breath when laying flat.   Past Medical History:  Diagnosis Date   1st degree AV block    a. PR 400 msec with Apacing   A-fib (HCC)    Hx of   Anemia    Anxiety    Arthritis    knees and lower back    Chest pain    a. 2004 Cath:  reportedly nl;  b. 09/2013 Neg MV; c. 11/2017 nl ETT; d. 08/2023 MV: No isch/infarct. nl LV fxn. Mild Ao Ca2+.   Chronic diastolic heart failure (HCC)    a. 09/2013 Echo: EF 50-55%, mild LVH, mild to mod MR/TR; b. 04/2023 Echo: EF 55-60%, no rwma, nl RV size/fxn, RVSP 44.75mmHg. Mod dil LA. Mod MR/TR.   CKD (chronic kidney disease), stage IV (HCC)    Colon polyps    Congestive heart disease (HCC)    Esophageal reflux    Glaucoma    Gout    H/O hiatal hernia    History of UTI    Hyperlipidemia    Moderate mitral regurgitation    a. 04/2023 Echo: Mod MR.   Morbid obesity (HCC)    OSA (obstructive  sleep apnea)    OSA on CPAP    CPAP   Pacemaker    copy of medtronic card in chart   PAF (paroxysmal atrial fibrillation) (HCC)    a. 10/2023 s/p DCCV (150J); b. CHA2DS2VASc = 5-->warfarin.   Primary hypertension    controlled on meds   Pulmonary hypertension (HCC)    a. 04/2023 Echo: RVSP 44.18mmHg.   Seasonal allergies    SSS (sick sinus syndrome) (HCC)    a. 2008 s/p MDT PPM;  b. 11/2013 Gen change- MDT ADDRL1 Adapta DC PPM, ser # [id number].   Symptomatic PVCs    Syncope and collapse    a. Felt to be vasovagal.   Thyroid disease    nodules on thyroid, with goiter, on no meds per pt   Tricuspid regurgitation    Type II diabetes mellitus (HCC)    Type 2   Unspecified glaucoma(365.9)    Vertigo    in past   Wears dentures    upper full and lower partial    Current Outpatient Medications  Medication Sig Dispense Refill   acetaminophen (TYLENOL) 500 MG tablet Take 1,000 mg by mouth every 6 (six) hours as needed for moderate pain.      allopurinol (  ZYLOPRIM) 100 MG tablet Take 100 mg by mouth daily.     amiodarone (PACERONE) 200 MG tablet TAKE 1 TABLET BY MOUTH EVERY DAY 90 tablet 0   amLODipine (NORVASC) 5 MG tablet Take 1 tablet (5 mg total) by mouth daily. 90 tablet 1   apixaban (ELIQUIS) 2.5 MG TABS tablet Take 1 tablet (2.5 mg total) by mouth 2 (two) times daily. 60 tablet 5   atorvastatin (LIPITOR) 20 MG tablet TAKE 1 TABLET (20 MG TOTAL) BY MOUTH DAILY. 90 tablet 0   Blood Glucose Monitoring Suppl (FIFTY50 GLUCOSE METER 2.0) w/Device KIT      calcitRIOL (ROCALTROL) 0.25 MCG capsule TAKE 1 CAPSULE (0.25 MCG TOTAL) BY MOUTH 1 (ONE) TIME EACH DAY     carvedilol (COREG) 25 MG tablet Take 1-2 tablets (25-50 mg total) by mouth as directed. Take 1 tablet (25MG ) by mouth every morning and 2 tablets (50MG ) by mouth every night before bed (Patient taking differently: Take 25-50 mg by mouth See admin instructions. Take 1 tablet (25MG ) by mouth every morning and 2 tablets (50MG ) by  mouth every night before bed) 270 tablet 3   ferrous sulfate 325 (65 FE) MG EC tablet Take 325 mg by mouth daily.      furosemide (LASIX) 40 MG tablet Take 40 mg by mouth daily.     Glycerin, Laxative, (FLEET LIQUID GLYCERIN SUPP RE) Place 1 Dose rectally daily as needed (constipation).     KLOR-CON M10 10 MEQ tablet TAKE 1 TABLET BY MOUTH ONCE DAILY 90 tablet 1   meclizine (ANTIVERT) 25 MG tablet Take 25 mg by mouth 3 (three) times daily as needed for dizziness or nausea.     Multiple Vitamins-Minerals (CENTRUM SILVER PO) Take 1 tablet by mouth daily.      OneTouch Delica Lancets 33G MISC      ONETOUCH VERIO test strip 3 (three) times daily.     pantoprazole (PROTONIX) 40 MG tablet TAKE 1 TABLET BY MOUTH EVERY DAY 90 tablet 1   polyethylene glycol (MIRALAX / GLYCOLAX) packet Take 17 g by mouth daily as needed for moderate constipation.     sitaGLIPtin (JANUVIA) 25 MG tablet Take 1 tablet (25 mg total) by mouth daily. 90 tablet 1   White Petrolatum-Mineral Oil (ARTIFICIAL TEARS) ointment Place 1 drop into both eyes daily as needed (Dry eyes).     No current facility-administered medications for this visit.    Allergies  Allergen Reactions   Metoprolol Other (See Comments)    Bad dreams Patient stated that she had suicidal thoughts while taking this medication.    Diltiazem Other (See Comments)    Patient stated that she had bad dreams with this medication   Levofloxacin Nausea And Vomiting and Nausea Only           Social History   Socioeconomic History   Marital status: Widowed    Spouse name: Not on file   Number of children: 2   Years of education: Not on file   Highest education level: 12th grade  Occupational History   Occupation: retired  Tobacco Use   Smoking status: Former    Current packs/day: 0.00    Average packs/day: 0.5 packs/day for 34.0 years (17.0 ttl pk-yrs)    Types: Cigarettes    Start date: 08/16/1955    Quit date: 08/15/1989    Years since quitting:  34.2   Smokeless tobacco: Never  Vaping Use   Vaping status: Never Used  Substance and Sexual Activity  Alcohol use: No    Alcohol/week: 0.0 standard drinks of alcohol   Drug use: No   Sexual activity: Never  Other Topics Concern   Not on file  Social History Narrative   Retired. Widowed. Regularly exercises.    Social Drivers of Corporate investment banker Strain: Low Risk  (10/22/2023)   Overall Financial Resource Strain (CARDIA)    Difficulty of Paying Living Expenses: Not hard at all  Food Insecurity: Patient Declined (10/19/2023)   Hunger Vital Sign    Worried About Running Out of Food in the Last Year: Patient declined    Ran Out of Food in the Last Year: Patient declined  Transportation Needs: No Transportation Needs (10/22/2023)   PRAPARE - Administrator, Civil Service (Medical): No    Lack of Transportation (Non-Medical): No  Physical Activity: Unknown (12/01/2018)   Exercise Vital Sign    Days of Exercise per Week: Patient declined    Minutes of Exercise per Session: Patient declined  Stress: Unknown (12/01/2018)   [employer] of Occupational Health - Occupational Stress Questionnaire    Feeling of Stress : Patient declined  Social Connections: Unknown (12/01/2018)   Social Connection and Isolation Panel [NHANES]    Frequency of Communication with Friends and Family: Patient declined    Frequency of Social Gatherings with Friends and Family: Patient declined    Attends Religious Services: Patient declined    Database administrator or Organizations: Patient declined    Attends Banker Meetings: Patient declined    Marital Status: Patient declined  Intimate Partner Violence: Patient Declined (10/19/2023)   Humiliation, Afraid, Rape, and Kick questionnaire    Fear of Current or Ex-Partner: Patient declined    Emotionally Abused: Patient declined    Physically Abused: Patient declined    Sexually Abused: Patient declined       Family History  Problem Relation Age of Onset   Cancer Mother    Liver cancer Mother    Pancreatic cancer Mother    Heart disease Father    Rheum arthritis Father    Allergies Father    Hypertension Sister    Rheum arthritis Sister    COPD Brother    Breast cancer Maternal Aunt     PHYSICAL EXAM: Vitals:   11/19/23 1345  BP: (!) 123/58  Pulse: 61  SpO2: 100%   GENERAL: Well nourished, well developed, and in no apparent distress at rest.  HEENT: Negative for arcus senilis or xanthelasma. There is no scleral icterus.  The mucous membranes are pink and moist.   NECK: Supple, No masses. Normal carotid upstrokes without bruits. No masses or thyromegaly.    CHEST: There are no chest wall deformities. There is no chest wall tenderness. Respirations are unlabored.  Lungs- decreased at bases CARDIAC:  JVP: 10 cm H2O         Normal S1, S2  Normal rate with regular rhythm. No murmurs, rubs or gallops.  Pulses are 2+ and symmetrical in upper and lower extremities. +2 to +3 edema to the knees; +1 edema in B/L thighs ABDOMEN: Soft, non-tender, non-distended. There are no masses or hepatomegaly. There are normal bowel sounds.  EXTREMITIES: Warm and well perfused with no cyanosis, clubbing.  LYMPHATIC: No axillary or supraclavicular lymphadenopathy.  NEUROLOGIC: Patient is oriented x3 with no focal or lateralizing neurologic deficits.  PSYCH: Patients affect is appropriate, there is no evidence of anxiety or depression.  SKIN: Warm and dry; no lesions or  wounds.   DATA REVIEW  ECG: 11/07/23: RV paced rhythm  As per my personal interpretation  ECHO: 10/20/23: LVEF 55%-60%  as per my personal interpretation  CATH: HEMODYNAMICS: RA:                  6 mmHg (mean) RV:                  45/1-6 mmHg PA:                  45/15 mmHg (28 mean) PCWP:            13 mmHg (mean)                                      Estimated Fick CO/CI   6.3 L/min, 2.9 L/min/m2                                               TPG                 15  mmHg                                            PVR                 2.4 Wood Units  PAPi                5     ASSESSMENT & PLAN:  Acute on chronic HFpEF - TTE w/ LVEF 60% on 10/20/23 - Currently volume overloaded with 2-3+ pitting edema in B/L lower extremities and worsening DOE on lasix 40mg  daily.  - Will increase lasix to 60mg  in the morning and 40 at night for the next 4 days. Then 60mg  daily. Follow up with Dr. Elwyn Lade next week to re-assess.  - eGFR less than 15, sCr of 3.14 on 11/09/23. Will hold off on addition of SGLT2i at this time. Not a candidate for spironolactone.  - Can consider finerenone.   2. HTN - Controlled today - Continue amlodipine 5mg  daily  3. Atrial fibrillation / flutter - s/p DCCV on 10/17/23 - continue apixaban 2.5mg  daily.  - ECG from 11/07/23 no longer appears to be sinus rhythm with A-V dyssynchrony; AFL - Plan on repeat ECG next week once more euvolemic; may require repeat DCCV.    4. CKD IV - see above -  eGFR less than 15 - Repeat BMP next week.   5. OSA - Wears CPAP nightly.   6. T2DM - Glipizide 10mg  daily - Januvia 25mg  daily - A1C on 09/26/23 of 7.2%   [facility] Tews Advanced [facility] Failure Mechanical Circulatory Support

## 2023-11-19 NOTE — Patient Instructions (Signed)
Medication Changes:  INCREASE LASIX (FUROSEMIDE) TO 60MG  IN THE MORNING AND 40MG  IN THE EVENING FOR THE NEXT 4 DAYS   ON FRIDAY- PLEASE TAKE FUROSEMIDE 60MG  DAILY THEREAFTER   Follow-Up in: 1 WEEK AS SCHEDULED WITH DR. Elwyn Lade   If you have any questions or concerns before your next appointment please send [place] a message through mychart or call our office at [phone] Monday-Friday 8 am-5 pm.   If you have an urgent need after hours on the weekend please call your Primary Cardiologist or the [facility] in [place] at [phone].   At the [facility], you and your health needs are our priority. We have a designated team specialized in the treatment of Heart Failure. This Care Team includes your primary Heart Failure Specialized Cardiologist (physician), Advanced Practice Providers (APPs- Physician Assistants and Nurse Practitioners), and Pharmacist who all work together to provide you with the care you need, when you need it.   You may see any of the following providers on your designated Care Team at your next follow up:  Dr. Arvilla Meres Dr. Marca Ancona Dr. Dorthula Nettles Dr. Theresia Bough Tonye Becket, NP Robbie Lis, [place] [address] [place], [place] Brynda Peon, NP [place] Lee, NP Clarisa Kindred, NP Enos Fling, PharmD

## 2023-11-21 DIAGNOSIS — N184 Chronic kidney disease, stage 4 (severe): Secondary | ICD-10-CM | POA: Diagnosis not present

## 2023-11-21 DIAGNOSIS — I10 Essential (primary) hypertension: Secondary | ICD-10-CM | POA: Diagnosis not present

## 2023-11-21 DIAGNOSIS — R600 Localized edema: Secondary | ICD-10-CM | POA: Diagnosis not present

## 2023-11-21 DIAGNOSIS — K50919 Crohn's disease, unspecified, with unspecified complications: Secondary | ICD-10-CM | POA: Diagnosis not present

## 2023-11-21 DIAGNOSIS — I480 Paroxysmal atrial fibrillation: Secondary | ICD-10-CM | POA: Diagnosis not present

## 2023-11-21 DIAGNOSIS — Z950 Presence of cardiac pacemaker: Secondary | ICD-10-CM | POA: Diagnosis not present

## 2023-11-21 DIAGNOSIS — I509 Heart failure, unspecified: Secondary | ICD-10-CM | POA: Diagnosis not present

## 2023-11-21 DIAGNOSIS — G4733 Obstructive sleep apnea (adult) (pediatric): Secondary | ICD-10-CM | POA: Diagnosis not present

## 2023-11-21 DIAGNOSIS — N2581 Secondary hyperparathyroidism of renal origin: Secondary | ICD-10-CM | POA: Diagnosis not present

## 2023-11-21 DIAGNOSIS — K219 Gastro-esophageal reflux disease without esophagitis: Secondary | ICD-10-CM | POA: Diagnosis not present

## 2023-11-21 DIAGNOSIS — D631 Anemia in chronic kidney disease: Secondary | ICD-10-CM | POA: Diagnosis not present

## 2023-11-21 DIAGNOSIS — N281 Cyst of kidney, acquired: Secondary | ICD-10-CM | POA: Diagnosis not present

## 2023-11-26 ENCOUNTER — Telehealth: Payer: Self-pay | Admitting: Cardiology

## 2023-11-26 ENCOUNTER — Other Ambulatory Visit: Payer: Self-pay | Admitting: Internal Medicine

## 2023-11-26 ENCOUNTER — Other Ambulatory Visit: Payer: Self-pay

## 2023-11-26 NOTE — Telephone Encounter (Signed)
Copied from CRM [id number]. Topic: Clinical - Medication Refill >> Nov 26, 2023 10:35 AM Leavy Cella D wrote: Most Recent Primary Care Visit:  Provider: [facility]  Department: [facility]  Visit Type: LAB  Date: 11/09/2023  Medication: sitaGLIPtin (JANUVIA) 25 MG tablet  Has the patient contacted their pharmacy? No (Agent: If no, request that the patient contact the pharmacy for the refill. If patient does not wish to contact the pharmacy document the reason why and proceed with request.) (Agent: If yes, when and what did the pharmacy advise?)  Is this the correct pharmacy for this prescription? Yes If no, delete pharmacy and type the correct one.  This is the patient's preferred pharmacy:  [id number] [place], [place] - [address] [place] [address] Phone: [phone] Fax: [phone]   Has the prescription been filled recently? No  Is the patient out of the medication? No  Has the patient been seen for an appointment in the last year OR does the patient have an upcoming appointment? Yes  Can we respond through MyChart? Yes  Agent: Please be advised that Rx refills may take up to 3 business days. We ask that you follow-up with your pharmacy.

## 2023-11-26 NOTE — Telephone Encounter (Signed)
No refills needed. Pt was sent in a 6 month supply of Eliquis in November 2024.

## 2023-11-29 ENCOUNTER — Encounter: Payer: Self-pay | Admitting: Cardiology

## 2023-11-29 ENCOUNTER — Ambulatory Visit: Payer: PPO | Attending: Cardiology | Admitting: Cardiology

## 2023-11-29 VITALS — BP 126/60 | HR 76 | Wt 239.0 lb

## 2023-11-29 DIAGNOSIS — Z7984 Long term (current) use of oral hypoglycemic drugs: Secondary | ICD-10-CM | POA: Diagnosis not present

## 2023-11-29 DIAGNOSIS — I495 Sick sinus syndrome: Secondary | ICD-10-CM | POA: Diagnosis not present

## 2023-11-29 DIAGNOSIS — E1122 Type 2 diabetes mellitus with diabetic chronic kidney disease: Secondary | ICD-10-CM | POA: Diagnosis not present

## 2023-11-29 DIAGNOSIS — I5033 Acute on chronic diastolic (congestive) heart failure: Secondary | ICD-10-CM

## 2023-11-29 DIAGNOSIS — R0602 Shortness of breath: Secondary | ICD-10-CM | POA: Diagnosis not present

## 2023-11-29 DIAGNOSIS — G4733 Obstructive sleep apnea (adult) (pediatric): Secondary | ICD-10-CM | POA: Diagnosis not present

## 2023-11-29 DIAGNOSIS — Z87891 Personal history of nicotine dependence: Secondary | ICD-10-CM | POA: Diagnosis not present

## 2023-11-29 DIAGNOSIS — Z7901 Long term (current) use of anticoagulants: Secondary | ICD-10-CM | POA: Insufficient documentation

## 2023-11-29 DIAGNOSIS — I5032 Chronic diastolic (congestive) heart failure: Secondary | ICD-10-CM | POA: Insufficient documentation

## 2023-11-29 DIAGNOSIS — Z950 Presence of cardiac pacemaker: Secondary | ICD-10-CM | POA: Insufficient documentation

## 2023-11-29 DIAGNOSIS — Z79899 Other long term (current) drug therapy: Secondary | ICD-10-CM | POA: Insufficient documentation

## 2023-11-29 DIAGNOSIS — N184 Chronic kidney disease, stage 4 (severe): Secondary | ICD-10-CM | POA: Diagnosis not present

## 2023-11-29 DIAGNOSIS — I130 Hypertensive heart and chronic kidney disease with heart failure and stage 1 through stage 4 chronic kidney disease, or unspecified chronic kidney disease: Secondary | ICD-10-CM | POA: Diagnosis not present

## 2023-11-29 DIAGNOSIS — I4892 Unspecified atrial flutter: Secondary | ICD-10-CM | POA: Diagnosis not present

## 2023-11-29 DIAGNOSIS — I480 Paroxysmal atrial fibrillation: Secondary | ICD-10-CM | POA: Diagnosis not present

## 2023-11-29 NOTE — Progress Notes (Signed)
ADVANCED HEART FAILURE CLINIC NOTE  Referring Physician: Dale Chubbuck, MD  Primary Care: Dale [place], MD  HPI: [name] [name] is a 88 y.o. female with HFpEF, paroxysmal atrial fibrillation, sick sinus syndrome status post permanent pacemaker, hypertension, OSA, CKD stage IV presenting today to establish care.  She establish care with heart failure in November 2024 when she was admitted with dyspnea on exertion and chest pressure.  At that time patient was found to be in atrial fibrillation with volume overload.  Echocardiogram during admission with EF of 55 to 60% but with severely elevated RVSP.  Patient was aggressively diuresed and underwent right heart catheterization which demonstrated preserved cardiac index and PA mean of 28 mmHg.  She reports some symptomatic improvement since her last visit on the increased dose of diuretics. Her weight has gone down about 5 pounds since her last visit. She does note that she has continued shortness of breath with exertion whenever she has to exert herself for more than 5-10 minutes, but improves rapidly with rest. She does not feel any HF fluttering or palpitations, sleeping well at night.    Past Medical History:  Diagnosis Date   1st degree AV block    a. PR 400 msec with Apacing   A-fib (HCC)    Hx of   Anemia    Anxiety    Arthritis    knees and lower back    Chest pain    a. 2004 Cath:  reportedly nl;  b. 09/2013 Neg MV; c. 11/2017 nl ETT; d. 08/2023 MV: No isch/infarct. nl LV fxn. Mild Ao Ca2+.   Chronic diastolic heart failure (HCC)    a. 09/2013 Echo: EF 50-55%, mild LVH, mild to mod MR/TR; b. 04/2023 Echo: EF 55-60%, no rwma, nl RV size/fxn, RVSP 44.77mmHg. Mod dil LA. Mod MR/TR.   CKD (chronic kidney disease), stage IV (HCC)    Colon polyps    Congestive heart disease (HCC)    Esophageal reflux    Glaucoma    Gout    H/O hiatal hernia    History of UTI    Hyperlipidemia    Moderate mitral regurgitation    a. 04/2023 Echo:  Mod MR.   Morbid obesity (HCC)    OSA (obstructive sleep apnea)    OSA on CPAP    CPAP   Pacemaker    copy of medtronic card in chart   PAF (paroxysmal atrial fibrillation) (HCC)    a. 10/2023 s/p DCCV (150J); b. CHA2DS2VASc = 5-->warfarin.   Primary hypertension    controlled on meds   Pulmonary hypertension (HCC)    a. 04/2023 Echo: RVSP 44.25mmHg.   Seasonal allergies    SSS (sick sinus syndrome) (HCC)    a. 2008 s/p MDT PPM;  b. 11/2013 Gen change- MDT ADDRL1 Adapta DC PPM, ser # [id number] H.   Symptomatic PVCs    Syncope and collapse    a. Felt to be vasovagal.   Thyroid disease    nodules on thyroid, with goiter, on no meds per pt   Tricuspid regurgitation    Type II diabetes mellitus (HCC)    Type 2   Unspecified glaucoma(365.9)    Vertigo    in past   Wears dentures    upper full and lower partial    Current Outpatient Medications  Medication Sig Dispense Refill   acetaminophen (TYLENOL) 500 MG tablet Take 1,000 mg by mouth every 6 (six) hours as needed for moderate pain.  allopurinol (ZYLOPRIM) 100 MG tablet Take 100 mg by mouth daily.     amiodarone (PACERONE) 200 MG tablet TAKE 1 TABLET BY MOUTH EVERY DAY 90 tablet 0   amLODipine (NORVASC) 5 MG tablet Take 1 tablet (5 mg total) by mouth daily. 90 tablet 1   apixaban (ELIQUIS) 2.5 MG TABS tablet Take 1 tablet (2.5 mg total) by mouth 2 (two) times daily. 60 tablet 5   atorvastatin (LIPITOR) 20 MG tablet TAKE 1 TABLET (20 MG TOTAL) BY MOUTH DAILY. 90 tablet 0   Blood Glucose Monitoring Suppl (FIFTY50 GLUCOSE METER 2.0) w/Device KIT      calcitRIOL (ROCALTROL) 0.25 MCG capsule TAKE 1 CAPSULE (0.25 MCG TOTAL) BY MOUTH 1 (ONE) TIME EACH DAY     carvedilol (COREG) 25 MG tablet Take 1-2 tablets (25-50 mg total) by mouth as directed. Take 1 tablet (25MG ) by mouth every morning and 2 tablets (50MG ) by mouth every night before bed (Patient taking differently: Take 25-50 mg by mouth See admin instructions. Take 1 tablet  (25MG ) by mouth every morning and 2 tablets (50MG ) by mouth every night before bed) 270 tablet 3   ferrous sulfate 325 (65 FE) MG EC tablet Take 325 mg by mouth daily.      furosemide (LASIX) 20 MG tablet Take 3 tablets (60 mg total) by mouth daily. 90 tablet 3   Glycerin, Laxative, (FLEET LIQUID GLYCERIN SUPP RE) Place 1 Dose rectally daily as needed (constipation).     KLOR-CON M10 10 MEQ tablet TAKE 1 TABLET BY MOUTH ONCE DAILY 90 tablet 1   meclizine (ANTIVERT) 25 MG tablet Take 25 mg by mouth 3 (three) times daily as needed for dizziness or nausea.     Multiple Vitamins-Minerals (CENTRUM SILVER PO) Take 1 tablet by mouth daily.      OneTouch Delica Lancets 33G MISC      ONETOUCH VERIO test strip 3 (three) times daily.     pantoprazole (PROTONIX) 40 MG tablet TAKE 1 TABLET BY MOUTH EVERY DAY 90 tablet 1   polyethylene glycol (MIRALAX / GLYCOLAX) packet Take 17 g by mouth daily as needed for moderate constipation.     sitaGLIPtin (JANUVIA) 25 MG tablet Take 1 tablet (25 mg total) by mouth daily. 90 tablet 1   White Petrolatum-Mineral Oil (ARTIFICIAL TEARS) ointment Place 1 drop into both eyes daily as needed (Dry eyes).     No current facility-administered medications for this visit.    Allergies  Allergen Reactions   Metoprolol Other (See Comments)    Bad dreams Patient stated that she had suicidal thoughts while taking this medication.    Diltiazem Other (See Comments)    Patient stated that she had bad dreams with this medication   Levofloxacin Nausea And Vomiting and Nausea Only           Social History   Socioeconomic History   Marital status: Widowed    Spouse name: Not on file   Number of children: 2   Years of education: Not on file   Highest education level: 12th grade  Occupational History   Occupation: retired  Tobacco Use   Smoking status: Former    Current packs/day: 0.00    Average packs/day: 0.5 packs/day for 34.0 years (17.0 ttl pk-yrs)    Types:  Cigarettes    Start date: 08/16/1955    Quit date: 08/15/1989    Years since quitting: 34.3   Smokeless tobacco: Never  Vaping Use   Vaping status: Never Used  Substance and Sexual Activity   Alcohol use: No    Alcohol/week: 0.0 standard drinks of alcohol   Drug use: No   Sexual activity: Never  Other Topics Concern   Not on file  Social History Narrative   Retired. Widowed. Regularly exercises.    Social Drivers of Corporate investment banker Strain: Low Risk  (10/22/2023)   Overall Financial Resource Strain (CARDIA)    Difficulty of Paying Living Expenses: Not hard at all  Food Insecurity: Patient Declined (10/19/2023)   Hunger Vital Sign    Worried About Running Out of Food in the Last Year: Patient declined    Ran Out of Food in the Last Year: Patient declined  Transportation Needs: No Transportation Needs (10/22/2023)   PRAPARE - Administrator, Civil Service (Medical): No    Lack of Transportation (Non-Medical): No  Physical Activity: Unknown (12/01/2018)   Exercise Vital Sign    Days of Exercise per Week: Patient declined    Minutes of Exercise per Session: Patient declined  Stress: Unknown (12/01/2018)   [employer] of Occupational Health - Occupational Stress Questionnaire    Feeling of Stress : Patient declined  Social Connections: Unknown (12/01/2018)   Social Connection and Isolation Panel [NHANES]    Frequency of Communication with Friends and Family: Patient declined    Frequency of Social Gatherings with Friends and Family: Patient declined    Attends Religious Services: Patient declined    Database administrator or Organizations: Patient declined    Attends Banker Meetings: Patient declined    Marital Status: Patient declined  Intimate Partner Violence: Patient Declined (10/19/2023)   Humiliation, Afraid, Rape, and Kick questionnaire    Fear of Current or Ex-Partner: Patient declined    Emotionally Abused: Patient declined     Physically Abused: Patient declined    Sexually Abused: Patient declined      Family History  Problem Relation Age of Onset   Cancer Mother    Liver cancer Mother    Pancreatic cancer Mother    Heart disease Father    Rheum arthritis Father    Allergies Father    Hypertension Sister    Rheum arthritis Sister    COPD Brother    Breast cancer Maternal Aunt     PHYSICAL EXAM: Vitals:   11/29/23 0904  BP: 126/60  Pulse: 76  SpO2: 100%   GENERAL: Well nourished, well developed, and in no apparent distress at rest.  HEENT: Negative for arcus senilis or xanthelasma. There is no scleral icterus.  The mucous membranes are pink and moist.   NECK: Supple, No masses. Normal carotid upstrokes without bruits. No masses or thyromegaly.    CHEST: There are no chest wall deformities. There is no chest wall tenderness. Respirations are unlabored.  Lungs- decreased at bases CARDIAC:  JVP: 7 cm H2O         Normal S1, S2  Normal rate with regular rhythm. No murmurs, rubs or gallops.  Pulses are 2+ and symmetrical in upper and lower extremities. 1+ LE to the knees ABDOMEN: Soft, non-tender, non-distended. There are no masses or hepatomegaly. There are normal bowel sounds.  EXTREMITIES: Warm and well perfused with no cyanosis, clubbing.  LYMPHATIC: No axillary or supraclavicular lymphadenopathy.  NEUROLOGIC: Patient is oriented x3 with no focal or lateralizing neurologic deficits.  PSYCH: Patients affect is appropriate, there is no evidence of anxiety or depression.  SKIN: Warm and dry; no lesions or wounds.  DATA REVIEW  ECG: 11/07/23: RV paced rhythm  As per my personal interpretation  ECHO: 10/20/23: LVEF 55%-60%  as per my personal interpretation  CATH: HEMODYNAMICS: RA:                  6 mmHg (mean) RV:                  45/1-6 mmHg PA:                  45/15 mmHg (28 mean) PCWP:            13 mmHg (mean)                                      Estimated Fick CO/CI   6.3 L/min,  2.9 L/min/m2                                              TPG                 15  mmHg                                            PVR                 2.4 Wood Units  PAPi                5     ASSESSMENT & PLAN:  Acute on chronic HFpEF - TTE w/ LVEF 60% on 10/20/23 - Improvement after increasing lasix dose - Will continue on 60mg  daily - Unfortunately her severely reduced GFR limits her therapeutic options. Finerenone not indicated for GFR <25 (discontinue if less than 15) -Could consider addition of SGLT2 which based on observational data appears to be safe in this population though still concerning with GFR -Pulmonary rehab given recent admission -High risk for pacer induced cardiomyopathy given high pacing burden  2. HTN - Controlled today - Continue amlodipine 5mg  daily  3. Atrial fibrillation / flutter - s/p DCCV on 10/17/23 - continue apixaban 2.5mg  daily.  - Continues to be in atrial flutter with V paced rhythm.  - 86% flutter/fib burden on last interrogation - Not symptomatic, and based on her recent admissions and echo is unlikely to remain in NSR - Discussed amiodarone and potential retrial of cardioversion, patient would like to hold off at this time   4. CKD IV - see above -  eGFR less than 15  5. OSA - Wears CPAP nightly.   6. T2DM - Glipizide stopped by PCP given hypoglycemia - Januvia 25mg  daily - A1C on 09/26/23 of 7.2%   Clearnce Hasten Advanced Heart Failure Mechanical Circulatory Support

## 2023-11-29 NOTE — Patient Instructions (Signed)
We have placed a referral for you. They should reach out within 1 week. If not, please give them a call. There information will be listed on this AVS.

## 2023-11-30 ENCOUNTER — Telehealth: Payer: Self-pay

## 2023-11-30 NOTE — Telephone Encounter (Signed)
FYI- patient was taken off of glipizide in Nov due to hypoglycemic episodes. She was advised to monitor CBG. Fasting CBG ranging 170s-190s. Pt requested to come in and discuss treatment options for diabetes. Had a f/u scheduled 1/29. I have moved appt up to 1/7. Pt will bring in sugar readings. Confirmed doing ok. No acute issues. She is aware you are out of office and will return 12/06/23

## 2023-11-30 NOTE — Telephone Encounter (Signed)
Copied from CRM [id number]. Topic: Clinical - Medical Advice >> Nov 30, 2023 12:05 PM Elita Quick wrote: Reason for CRM: PATENT THINKS THAT HER BLOOD SUGAR IS RUNNING HIGH IN THE MORNING AND SHE WANTS TO DISCUSS HER MEDS

## 2023-12-03 ENCOUNTER — Ambulatory Visit: Payer: PPO | Admitting: Cardiology

## 2023-12-03 DIAGNOSIS — K219 Gastro-esophageal reflux disease without esophagitis: Secondary | ICD-10-CM | POA: Diagnosis not present

## 2023-12-03 DIAGNOSIS — N2581 Secondary hyperparathyroidism of renal origin: Secondary | ICD-10-CM | POA: Diagnosis not present

## 2023-12-03 DIAGNOSIS — I509 Heart failure, unspecified: Secondary | ICD-10-CM | POA: Diagnosis not present

## 2023-12-03 DIAGNOSIS — R600 Localized edema: Secondary | ICD-10-CM | POA: Diagnosis not present

## 2023-12-03 DIAGNOSIS — N184 Chronic kidney disease, stage 4 (severe): Secondary | ICD-10-CM | POA: Diagnosis not present

## 2023-12-03 DIAGNOSIS — I480 Paroxysmal atrial fibrillation: Secondary | ICD-10-CM | POA: Diagnosis not present

## 2023-12-03 DIAGNOSIS — D631 Anemia in chronic kidney disease: Secondary | ICD-10-CM | POA: Diagnosis not present

## 2023-12-03 DIAGNOSIS — I10 Essential (primary) hypertension: Secondary | ICD-10-CM | POA: Diagnosis not present

## 2023-12-03 DIAGNOSIS — G4733 Obstructive sleep apnea (adult) (pediatric): Secondary | ICD-10-CM | POA: Diagnosis not present

## 2023-12-03 DIAGNOSIS — Z950 Presence of cardiac pacemaker: Secondary | ICD-10-CM | POA: Diagnosis not present

## 2023-12-03 DIAGNOSIS — K50919 Crohn's disease, unspecified, with unspecified complications: Secondary | ICD-10-CM | POA: Diagnosis not present

## 2023-12-03 DIAGNOSIS — N281 Cyst of kidney, acquired: Secondary | ICD-10-CM | POA: Diagnosis not present

## 2023-12-04 NOTE — Progress Notes (Signed)
 Cardiology Office Note Date:  12/07/2023  Patient ID:  [name] [name], [name] [name] 1935-10-25, MRN [id number] PCP:  Glendia Shad, MD  Cardiologist:  Evalene Lunger, MD Electrophysiologist: OLE ONEIDA HOLTS, MD  Chief Complaint: SOB  History of Present Illness: [name] [name] is a 88 y.o. female with PMH notable for tachy-brady syndrome, SND s/p PPM, CKD4, parox afib on amiodarone , HFpEF, pulmHTN, OSA on CPAP; seen today for OLE ONEIDA HOLTS, MD for routine follow-up.   Last saw Dr. Holts 05/2022 to establish care transferred from [facility].   She has had progressive SOB dating back to 02/2023. TTE, myoview , and pulm work-up have been unrevealing.  She was recently hospitalized 10/2023 after successful DCCV where she became more SOB afterwards. She was in sinus rhythm at the time of presentation to the ER and converted to AFib/flutter during hospitalization. RHC at that time showed preserved cardiac index with PA mean of . She has seen Adv [facility] clinic, most recently 12/26. She continued to c/o SOB with exertion of 5-10 minutes, but was slightly improved on higher dose of lasix .  On follow-up today, she is feeling slightly better from a SOB standpoint. She does continue to have fatigue most mornings. She has been working with [facility], and doing the PT exercises on her own. She has a long, enclosed hallway in her apt complex and walks it several times a day without SOB or fatigue.  No cardiac awareness of afib. She continues to take eliquis  BID, no missed doses, no bleeding concerns.    She denies chest pain, palpitations, dyspnea, PND, orthopnea, nausea, vomiting, dizziness, syncope, edema, weight gain, or early satiety.       Device Information: MDT dual chamber PPM, imp 12/2006; SND Gen change 2014  AAD History: Amiodarone      ROS:  Please see the history of present illness. All other systems are reviewed and otherwise negative.   PHYSICAL EXAM:  VS:  BP 104/60 (BP Location:  Left Arm, Patient Position: Sitting, Cuff Size: Large)   Pulse 81   Ht 5' 4 (1.626 m)   Wt 236 lb 8 oz (107.3 kg)   SpO2 98%   BMI 40.60 kg/m  BMI: Body mass index is 40.6 kg/m.  Wt Readings from Last 3 Encounters:  12/07/23 236 lb 8 oz (107.3 kg)  11/29/23 239 lb (108.4 kg)  11/19/23 243 lb 6.4 oz (110.4 kg)    GEN- The patient is well appearing, alert and oriented x 3 today.   Lungs- Clear to ausculation bilaterally, normal work of breathing.  Heart- Regular rate and rhythm, no murmurs, rubs or gallops Extremities- 1+ peripheral edema, warm, dry Skin-   device pocket well-healed, no tethering   Device interrogation done today and reviewed by myself:  Battery 3.5 years Lead thresholds, impedence, sensing stable  Persistent AFib since last interrogation > near 100% mode switch High VP Histograms blunted    EKG is not ordered. Personal review of EKG from  11/29/2023  shows:  V pace, rate 67     Recent Labs: 10/19/2023: B Natriuretic Peptide 577.9 10/22/2023: Magnesium  2.2; Platelets 127 10/23/2023: Hemoglobin 9.5 11/09/2023: ALT 18; BUN 34; Creatinine, Ser 3.14; Potassium 4.0; Sodium 142; TSH 0.42  11/09/2023: Cholesterol 150; HDL 49.30; LDL Cholesterol 84; Total CHOL/HDL Ratio 3; Triglycerides 81.0; VLDL 16.2   CrCl cannot be calculated (Patient's most recent lab result is older than the maximum 21 days allowed.).   Additional studies reviewed include: Previous EP, cardiology notes.  RHC, 10/23/2023 Normal pre and post capillary filling pressures.  Mildly elevated PA mean and PVR likely secondary to HFpEF Normal cardiac output / index.   TTE, 10/20/2023  1. Left ventricular ejection fraction, by estimation, is 55 to 60%. The left ventricle has normal function. The left ventricle has no regional wall motion abnormalities. Left ventricular diastolic parameters are indeterminate.   2. Right ventricular systolic function is low normal. The right ventricular size is  mildly enlarged. There is severely elevated pulmonary artery systolic pressure. The estimated right ventricular systolic pressure is 62.8 mmHg.   3. Left atrial size was mildly dilated.   4. The mitral valve is normal in structure. No evidence of mitral valve regurgitation. No evidence of mitral stenosis.   5. Tricuspid valve regurgitation is moderate.   6. The aortic valve is normal in structure. Aortic valve regurgitation is not visualized. No aortic stenosis is present.   7. The inferior vena cava is normal in size with greater than 50% respiratory variability, suggesting right atrial pressure of 3 mmHg.   NM myocardial w spect, 08/24/2023   The study is normal. The study is low risk.   No ST deviation was noted.   LV perfusion is normal. There is no evidence of ischemia. There is no evidence of infarction.   Left ventricular function is normal. End diastolic cavity size is normal. End systolic cavity size is normal.   CT attenuation images showed mild aortic calcifications.  TTE, 04/25/2023 1. Left ventricular ejection fraction, by estimation, is 55 to 60%. The left ventricle has normal function. The left ventricle has no regional wall motion abnormalities. Left ventricular diastolic parameters were normal.   2. Right ventricular systolic function is normal. The right ventricular size is normal. There is mildly elevated pulmonary artery systolic pressure. The estimated right ventricular systolic pressure is 44.7 mmHg.   3. Left atrial size was moderately dilated.   4. The mitral valve is normal in structure. Moderate mitral valve regurgitation. No evidence of mitral stenosis.   5. Tricuspid valve regurgitation is moderate.   6. The aortic valve is tricuspid. Aortic valve regurgitation is not visualized. No aortic stenosis is present.   7. The inferior vena cava is normal in size with greater than 50% respiratory variability, suggesting right atrial pressure of 3 mmHg.   ASSESSMENT AND  PLAN:  #) SND s/p MDT PPM #) 1st deg AV block #) persis AFib, flutter #) high-risk medication - amiodarone  Tolerating amiodarone  well, continue 200mg  daily Continue 25mg  coreg  BID High AF burden via device, which causes mode switch. This combined with her SND and long 1st deg HB has lead to a high VP She is feeling better than she has in months, likely d/t higher diuretic dose Will Re-attempt DCCV Discussed that if DCCV is not successful, would need to consider stopping amiodarone  Recent thyroid  labs and LFTs stable Update BMP, CBC for cardioversion   #) Hypercoag d/t AFib CHA2DS2-VASc Score = 6 [CHF History: 1, HTN History: 1, Diabetes History: 1, Stroke History: 0, Vascular Disease History: 0, Age Score: 2, Gender Score: 1].  Therefore, the patient's annual risk of stroke is 9.7 % OAC - 2.5mg  eliquis  BID No bleeding concerns    #) SOB #) HFpEF #) CKD - stage 4 Most recent TTE with perserved LVEF Appears euvolemic, weight continues to decrease, less SOB CKD limits GDMT options Continue 60mg  lasix  daily    Informed Consent   Shared Decision Making/Informed Consent The risks (stroke, cardiac arrhythmias  rarely resulting in the need for a temporary or permanent pacemaker, skin irritation or burns and complications associated with conscious sedation including aspiration, arrhythmia, respiratory failure and death), benefits (restoration of normal sinus rhythm) and alternatives of a direct current cardioversion were explained in detail to Ms. Daza and she agrees to proceed.          Current medicines are reviewed at length with the patient today.   The patient does not have concerns regarding her medicines.  The following changes were made today:  none  Labs/ tests ordered today include:  Orders Placed This Encounter  Procedures   CBC   Basic metabolic panel     Disposition: Follow up with  Dr. Cindie or EP APP as usual post procedure     Signed, Chantal Needle,  NP  12/07/23  10:51 AM  Electrophysiology [facility]

## 2023-12-04 NOTE — H&P (View-Only) (Signed)
 Cardiology Office Note Date:  12/07/2023  Patient ID:  [name] [name] 1935-10-25, MRN [id number] PCP:  Glendia Shad, MD  Cardiologist:  Evalene Lunger, MD Electrophysiologist: [name] ONEIDA HOLTS, MD  Chief Complaint: SOB  History of Present Illness: [name] [name] is a 88 y.o. female with PMH notable for tachy-brady syndrome, SND s/p PPM, CKD4, parox afib on amiodarone , HFpEF, pulmHTN, OSA on CPAP; seen today for [name] ONEIDA HOLTS, MD for routine follow-up.   Last saw Dr. Holts 05/2022 to establish care transferred from [facility].   She has had progressive SOB dating back to 02/2023. TTE, myoview , and pulm work-up have been unrevealing.  She was recently hospitalized 10/2023 after successful DCCV where she became more SOB afterwards. She was in sinus rhythm at the time of presentation to the ER and converted to AFib/flutter during hospitalization. RHC at that time showed preserved cardiac index with PA mean of . She has seen [facility], most recently 12/26. She continued to c/o SOB with exertion of 5-10 minutes, but was slightly improved on higher dose of lasix .  On follow-up today, she is feeling slightly better from a SOB standpoint. She does continue to have fatigue most mornings. She has been working with [facility], and doing the PT exercises on her own. She has a long, enclosed hallway in her apt complex and walks it several times a day without SOB or fatigue.  No cardiac awareness of afib. She continues to take eliquis  BID, no missed doses, no bleeding concerns.    She denies chest pain, palpitations, dyspnea, PND, orthopnea, nausea, vomiting, dizziness, syncope, edema, weight gain, or early satiety.       Device Information: MDT dual chamber PPM, imp 12/2006; SND Gen change 2014  AAD History: Amiodarone      ROS:  Please see the history of present illness. All other systems are reviewed and otherwise negative.   PHYSICAL EXAM:  VS:  BP 104/60 (BP Location:  Left Arm, Patient Position: Sitting, Cuff Size: Large)   Pulse 81   Ht 5' 4 (1.626 m)   Wt 236 lb 8 oz (107.3 kg)   SpO2 98%   BMI 40.60 kg/m  BMI: Body mass index is 40.6 kg/m.  Wt Readings from Last 3 Encounters:  12/07/23 236 lb 8 oz (107.3 kg)  11/29/23 239 lb (108.4 kg)  11/19/23 243 lb 6.4 oz (110.4 kg)    GEN- The patient is well appearing, alert and oriented x 3 today.   Lungs- Clear to ausculation bilaterally, normal work of breathing.  Heart- Regular rate and rhythm, no murmurs, rubs or gallops Extremities- 1+ peripheral edema, warm, dry Skin-   device pocket well-healed, no tethering   Device interrogation done today and reviewed by myself:  Battery 3.5 years Lead thresholds, impedence, sensing stable  Persistent AFib since last interrogation > near 100% mode switch High VP Histograms blunted    EKG is not ordered. Personal review of EKG from  11/29/2023  shows:  V pace, rate 67     Recent Labs: 10/19/2023: B Natriuretic Peptide 577.9 10/22/2023: Magnesium  2.2; Platelets 127 10/23/2023: Hemoglobin 9.5 11/09/2023: ALT 18; BUN 34; Creatinine, Ser 3.14; Potassium 4.0; Sodium 142; TSH 0.42  11/09/2023: Cholesterol 150; HDL 49.30; LDL Cholesterol 84; Total CHOL/HDL Ratio 3; Triglycerides 81.0; VLDL 16.2   CrCl cannot be calculated (Patient's most recent lab result is older than the maximum 21 days allowed.).   Additional studies reviewed include: Previous EP, cardiology notes.  RHC, 10/23/2023 Normal pre and post capillary filling pressures.  Mildly elevated PA mean and PVR likely secondary to HFpEF Normal cardiac output / index.   TTE, 10/20/2023  1. Left ventricular ejection fraction, by estimation, is 55 to 60%. The left ventricle has normal function. The left ventricle has no regional wall motion abnormalities. Left ventricular diastolic parameters are indeterminate.   2. Right ventricular systolic function is low normal. The right ventricular size is  mildly enlarged. There is severely elevated pulmonary artery systolic pressure. The estimated right ventricular systolic pressure is 62.8 mmHg.   3. Left atrial size was mildly dilated.   4. The mitral valve is normal in structure. No evidence of mitral valve regurgitation. No evidence of mitral stenosis.   5. Tricuspid valve regurgitation is moderate.   6. The aortic valve is normal in structure. Aortic valve regurgitation is not visualized. No aortic stenosis is present.   7. The inferior vena cava is normal in size with greater than 50% respiratory variability, suggesting right atrial pressure of 3 mmHg.   NM myocardial w spect, 08/24/2023   The study is normal. The study is low risk.   No ST deviation was noted.   LV perfusion is normal. There is no evidence of ischemia. There is no evidence of infarction.   Left ventricular function is normal. End diastolic cavity size is normal. End systolic cavity size is normal.   CT attenuation images showed mild aortic calcifications.  TTE, 04/25/2023 1. Left ventricular ejection fraction, by estimation, is 55 to 60%. The left ventricle has normal function. The left ventricle has no regional wall motion abnormalities. Left ventricular diastolic parameters were normal.   2. Right ventricular systolic function is normal. The right ventricular size is normal. There is mildly elevated pulmonary artery systolic pressure. The estimated right ventricular systolic pressure is 44.7 mmHg.   3. Left atrial size was moderately dilated.   4. The mitral valve is normal in structure. Moderate mitral valve regurgitation. No evidence of mitral stenosis.   5. Tricuspid valve regurgitation is moderate.   6. The aortic valve is tricuspid. Aortic valve regurgitation is not visualized. No aortic stenosis is present.   7. The inferior vena cava is normal in size with greater than 50% respiratory variability, suggesting right atrial pressure of 3 mmHg.   ASSESSMENT AND  PLAN:  #) SND s/p MDT PPM #) 1st deg AV block #) persis AFib, flutter #) high-risk medication - amiodarone  Tolerating amiodarone  well, continue 200mg  daily Continue 25mg  coreg  BID High AF burden via device, which causes mode switch. This combined with her SND and long 1st deg HB has lead to a high VP She is feeling better than she has in months, likely d/t higher diuretic dose Will Re-attempt DCCV Discussed that if DCCV is not successful, would need to consider stopping amiodarone  Recent thyroid  labs and LFTs stable Update BMP, CBC for cardioversion   #) Hypercoag d/t AFib CHA2DS2-VASc Score = 6 [CHF History: 1, HTN History: 1, Diabetes History: 1, Stroke History: 0, Vascular Disease History: 0, Age Score: 2, Gender Score: 1].  Therefore, the patient's annual risk of stroke is 9.7 % OAC - 2.5mg  eliquis  BID No bleeding concerns    #) SOB #) HFpEF #) CKD - stage 4 Most recent TTE with perserved LVEF Appears euvolemic, weight continues to decrease, less SOB CKD limits GDMT options Continue 60mg  lasix  daily    Informed Consent   Shared Decision Making/Informed Consent The risks (stroke, cardiac arrhythmias  rarely resulting in the need for a temporary or permanent pacemaker, skin irritation or burns and complications associated with conscious sedation including aspiration, arrhythmia, respiratory failure and death), benefits (restoration of normal sinus rhythm) and alternatives of a direct current cardioversion were explained in detail to Ms. Daza and she agrees to proceed.          Current medicines are reviewed at length with the patient today.   The patient does not have concerns regarding her medicines.  The following changes were made today:  none  Labs/ tests ordered today include:  Orders Placed This Encounter  Procedures   CBC   Basic metabolic panel     Disposition: Follow up with  Dr. Cindie or EP APP as usual post procedure     Signed, Chantal Needle,  NP  12/07/23  10:51 AM  Electrophysiology [facility]

## 2023-12-06 ENCOUNTER — Telehealth: Payer: Self-pay

## 2023-12-06 ENCOUNTER — Other Ambulatory Visit: Payer: Self-pay

## 2023-12-06 DIAGNOSIS — H26492 Other secondary cataract, left eye: Secondary | ICD-10-CM | POA: Diagnosis not present

## 2023-12-06 DIAGNOSIS — E7800 Pure hypercholesterolemia, unspecified: Secondary | ICD-10-CM

## 2023-12-06 MED ORDER — ATORVASTATIN CALCIUM 20 MG PO TABS: ORAL_TABLET | ORAL | 3 refills | Status: DC

## 2023-12-06 NOTE — Telephone Encounter (Signed)
 Confirmed with patient and medication refilled.

## 2023-12-06 NOTE — Telephone Encounter (Signed)
 Copied from CRM [id number]. Topic: Clinical - Medication Refill >> Dec 04, 2023 10:08 AM Leila BROCKS wrote: Most Recent Primary Care Visit:  Provider: [facility]  Department: [facility]  Visit Type: LAB  Date: 11/09/2023  Medication: atorvastatin  (LIPITOR) 20 MG tablet  Has the patient contacted their pharmacy? No (Agent: If no, request that the patient contact the pharmacy for the refill. If patient does not wish to contact the pharmacy document the reason why and proceed with request.) (Agent: If yes, when and what did the pharmacy advise?)  Is this the correct pharmacy for this prescription? Yes If no, delete pharmacy and type the correct one.  This is the patient's preferred pharmacy:  [id number] [place], [place] - [address] [place] [address] Phone: [phone] Fax: [phone]   Has the prescription been filled recently?   Is the patient out of the medication? Yes  Has the patient been seen for an appointment in the last year OR does the patient have an upcoming appointment?    Can we respond through MyChart? No, please call back at [phone]   Agent: Please be advised that Rx refills may take up to 3 business days. We ask that you follow-up with your pharmacy.

## 2023-12-06 NOTE — Telephone Encounter (Signed)
 Copied from CRM [id number]. Topic: Clinical - Prescription Issue >> Dec 06, 2023  3:33 PM Thersia BROCKS wrote: Reason for CRM: Patient stated she went to the pharmacy to pick up medication atorvastatin  (LIPITOR) 20 MG tablet and pharmacy stated that it was not ready.She would like the status of the refill request  I advise patient it takes up to 3 business day for refill request.

## 2023-12-06 NOTE — Telephone Encounter (Signed)
 Newer pt to me. Please call and confirm if she is taking regularly and tolerating.  If so, ok to refill.

## 2023-12-06 NOTE — Telephone Encounter (Signed)
 See other note

## 2023-12-07 ENCOUNTER — Ambulatory Visit: Payer: PPO | Attending: Cardiology | Admitting: Cardiology

## 2023-12-07 ENCOUNTER — Encounter: Payer: Self-pay | Admitting: Cardiology

## 2023-12-07 VITALS — BP 104/60 | HR 81 | Ht 64.0 in | Wt 236.5 lb

## 2023-12-07 DIAGNOSIS — D6869 Other thrombophilia: Secondary | ICD-10-CM | POA: Diagnosis not present

## 2023-12-07 DIAGNOSIS — I5032 Chronic diastolic (congestive) heart failure: Secondary | ICD-10-CM

## 2023-12-07 DIAGNOSIS — R0602 Shortness of breath: Secondary | ICD-10-CM | POA: Diagnosis not present

## 2023-12-07 DIAGNOSIS — Z79899 Other long term (current) drug therapy: Secondary | ICD-10-CM | POA: Diagnosis not present

## 2023-12-07 DIAGNOSIS — I480 Paroxysmal atrial fibrillation: Secondary | ICD-10-CM | POA: Diagnosis not present

## 2023-12-07 LAB — CUP PACEART INCLINIC DEVICE CHECK
Battery Impedance: 1485 Ohm
Battery Remaining Longevity: 44 mo
Battery Voltage: 2.76 V
Brady Statistic AP VP Percent: 77 %
Brady Statistic AP VS Percent: 1 %
Brady Statistic AS VP Percent: 20 %
Brady Statistic AS VS Percent: 2 %
Date Time Interrogation Session: 20250103114628
Implantable Lead Connection Status: 753985
Implantable Lead Connection Status: 753985
Implantable Lead Implant Date: 20080117
Implantable Lead Implant Date: 20080117
Implantable Lead Location: 753859
Implantable Lead Location: 753860
Implantable Lead Model: 5076
Implantable Lead Model: 5076
Implantable Pulse Generator Implant Date: 20141229
Lead Channel Impedance Value: 429 Ohm
Lead Channel Impedance Value: 640 Ohm
Lead Channel Pacing Threshold Amplitude: 0.625 V
Lead Channel Pacing Threshold Amplitude: 1 V
Lead Channel Pacing Threshold Pulse Width: 0.4 ms
Lead Channel Pacing Threshold Pulse Width: 0.4 ms
Lead Channel Sensing Intrinsic Amplitude: 1.4 mV
Lead Channel Sensing Intrinsic Amplitude: 11.2 mV
Lead Channel Setting Pacing Amplitude: 2 V
Lead Channel Setting Pacing Amplitude: 2.5 V
Lead Channel Setting Pacing Pulse Width: 0.4 ms
Lead Channel Setting Sensing Sensitivity: 2.8 mV
Zone Setting Status: 755011
Zone Setting Status: 755011

## 2023-12-07 NOTE — Patient Instructions (Signed)
 Medication Instructions:  Your physician recommends that you continue on your current medications as directed. Please refer to the Current Medication list given to you today.   *If you need a refill on your cardiac medications before your next appointment, please call your pharmacy*   Lab Work: Your provider would like for you to have following labs drawn today (CBC, BMP).     Testing/Procedures: Cardioversion  (Please see instructions below)   Follow-Up: At [facility], you and your health needs are our priority.  As part of our continuing mission to provide you with exceptional heart care, we have created designated Provider Care Teams.  These Care Teams include your primary Cardiologist (physician) and Advanced Practice Providers (APPs -  Physician Assistants and Nurse Practitioners) who all work together to provide you with the care you need, when you need it.  We recommend signing up for the patient portal called MyChart.  Sign up information is provided on this After Visit Summary.  MyChart is used to connect with patients for Virtual Visits (Telemedicine).  Patients are able to view lab/test results, encounter notes, upcoming appointments, etc.  Non-urgent messages can be sent to your provider as well.   To learn more about what you can do with MyChart, go to [email or web address].    Your next appointment:   2-4 week(s)  Provider:   Ole Holts, MD        Dear [name] [name]  You are scheduled for a Cardioversion on Thursday, January 9 with Dr. Gollan.  Please arrive at the [facility] Entrance of [facility], [address], [place] [address] at 7:00 AM (This is 1 hour(s) prior to your procedure time).  Proceed to the Check-In Desk directly inside the entrance.  Procedure Parking: Use the entrance off of the [address] side of the hospital. Turn right upon entering and follow the driveway to parking that is directly in front of the [facility]. There is no valet parking available at this entrance, however there is an awning directly in front of the [facility] for drop off/ pick up for patients.    DIET:  Nothing to eat or drink after midnight except a sip of water with medications (see medication instructions below)  MEDICATION INSTRUCTIONS: Hold all diabetic medication and lasix  the morning of procedure     Continue taking your anticoagulant (blood thinner): Apixaban  (Eliquis ).  You will need to continue this after your procedure until you are told by your provider that it is safe to stop.    LABS: Today (CBC, BMP)  FYI:  For your safety, and to allow us  to monitor your vital signs accurately during the surgery/procedure we request: If you have artificial nails, gel coating, SNS etc, please have those removed prior to your surgery/procedure. Not having the nail coverings /polish removed may result in cancellation or delay of your surgery/procedure.  Your support person will be asked to wait in the waiting room during your procedure.  It is OK to have someone drop you off and come back when you are ready to be discharged.  You cannot drive after the procedure and will need someone to drive you home.  Bring your insurance cards.  *Special Note: Every effort is made to have your procedure done on time. Occasionally there are emergencies that occur at the hospital that may cause delays. Please be patient if a delay does occur.

## 2023-12-08 LAB — CBC
Hematocrit: 33.4 % — ABNORMAL LOW (ref 34.0–46.6)
Hemoglobin: 10.5 g/dL — ABNORMAL LOW (ref 11.1–15.9)
MCH: 26.6 pg (ref 26.6–33.0)
MCHC: 31.4 g/dL — ABNORMAL LOW (ref 31.5–35.7)
MCV: 85 fL (ref 79–97)
Platelets: 141 10*3/uL — ABNORMAL LOW (ref 150–450)
RBC: 3.95 x10E6/uL (ref 3.77–5.28)
RDW: 15.9 % — ABNORMAL HIGH (ref 11.7–15.4)
WBC: 6.4 10*3/uL (ref 3.4–10.8)

## 2023-12-08 LAB — BASIC METABOLIC PANEL
BUN/Creatinine Ratio: 10 — ABNORMAL LOW (ref 12–28)
BUN: 30 mg/dL — ABNORMAL HIGH (ref 8–27)
CO2: 26 mmol/L (ref 20–29)
Calcium: 9.6 mg/dL (ref 8.7–10.3)
Chloride: 102 mmol/L (ref 96–106)
Creatinine, Ser: 2.99 mg/dL — ABNORMAL HIGH (ref 0.57–1.00)
Glucose: 248 mg/dL — ABNORMAL HIGH (ref 70–99)
Potassium: 4.1 mmol/L (ref 3.5–5.2)
Sodium: 142 mmol/L (ref 134–144)
eGFR: 15 mL/min/{1.73_m2} — ABNORMAL LOW (ref >59)

## 2023-12-11 ENCOUNTER — Ambulatory Visit (INDEPENDENT_AMBULATORY_CARE_PROVIDER_SITE_OTHER): Payer: PPO | Admitting: Internal Medicine

## 2023-12-11 ENCOUNTER — Encounter: Payer: Self-pay | Admitting: Internal Medicine

## 2023-12-11 VITALS — BP 118/68 | HR 72 | Temp 97.9°F | Resp 16 | Ht 64.0 in | Wt 237.0 lb

## 2023-12-11 DIAGNOSIS — I10 Essential (primary) hypertension: Secondary | ICD-10-CM | POA: Diagnosis not present

## 2023-12-11 DIAGNOSIS — E782 Mixed hyperlipidemia: Secondary | ICD-10-CM

## 2023-12-11 DIAGNOSIS — G4733 Obstructive sleep apnea (adult) (pediatric): Secondary | ICD-10-CM

## 2023-12-11 DIAGNOSIS — I5032 Chronic diastolic (congestive) heart failure: Secondary | ICD-10-CM | POA: Diagnosis not present

## 2023-12-11 DIAGNOSIS — I272 Pulmonary hypertension, unspecified: Secondary | ICD-10-CM

## 2023-12-11 DIAGNOSIS — D582 Other hemoglobinopathies: Secondary | ICD-10-CM | POA: Diagnosis not present

## 2023-12-11 DIAGNOSIS — N184 Chronic kidney disease, stage 4 (severe): Secondary | ICD-10-CM

## 2023-12-11 DIAGNOSIS — I480 Paroxysmal atrial fibrillation: Secondary | ICD-10-CM

## 2023-12-11 DIAGNOSIS — D631 Anemia in chronic kidney disease: Secondary | ICD-10-CM

## 2023-12-11 DIAGNOSIS — E042 Nontoxic multinodular goiter: Secondary | ICD-10-CM | POA: Diagnosis not present

## 2023-12-11 DIAGNOSIS — R0602 Shortness of breath: Secondary | ICD-10-CM | POA: Diagnosis not present

## 2023-12-11 DIAGNOSIS — E119 Type 2 diabetes mellitus without complications: Secondary | ICD-10-CM | POA: Diagnosis not present

## 2023-12-11 DIAGNOSIS — N2581 Secondary hyperparathyroidism of renal origin: Secondary | ICD-10-CM

## 2023-12-11 NOTE — Progress Notes (Signed)
 Subjective:    Patient ID: [name] [name], female    DOB: 02-26-1935, 89 y.o.   MRN: [id number]  Patient here for  Chief Complaint  Patient presents with   Diabetes    Discuss treatment options    HPI Here as a work in appt - work in to discuss her diabetes and treatment. Has been seeing Dr Cherilyn for her diabetes and f/u multinodular goiter. She was previously having some intermittent low sugars.  Not taking glipizide  now. We discussed remaining off this medication, given the low sugars and her renal function. She did have pancreatitis in the past, so will avoid GLP - 1 agonist. Given her renal function, she is unable to use SGLT-2 and metformin . States sugars averaging 120-160. She has stage IV/V CKD with a creatinine of 3.1 and a GFR of around 13 cc/min. She recently had congestive heart failure and the dose of Lasix  has been increased. She has been drinking over 60 to 70 ounces of water daily.  She recently had a renal sonogram which showed multiple cysts and chronicity. Had f/u with cardiology 11/29/23 - persistent sob with exertion, but recovers quickly. Recommended continue 60mg  lasix  daily. Recommended pulmonary rehab. She reports she is doing relatively well. Breathing stable. Had questions about her sugar levels. Reviewed outside sugar readings - most averaging 140-180. Last A1c 09/26/23 - 7.2. discussed with her the sugar readings and treatment options. She is 60 - not looking for tight control of her sugars. Discussed dangerous for her sugar to be too low. Also discussed reasons for not starting some of these newer medications as outlined above.    Past Medical History:  Diagnosis Date   1st degree AV block    a. PR 400 msec with Apacing   A-fib (HCC)    Hx of   Anemia    Anxiety    Arthritis    knees and lower back    Chest pain    a. 2004 Cath:  reportedly nl;  b. 09/2013 Neg MV; c. 11/2017 nl ETT; d. 08/2023 MV: No isch/infarct. nl LV fxn. Mild Ao Ca2+.   Chronic  diastolic heart failure (HCC)    a. 09/2013 Echo: EF 50-55%, mild LVH, mild to mod MR/TR; b. 04/2023 Echo: EF 55-60%, no rwma, nl RV size/fxn, RVSP 44.75mmHg. Mod dil LA. Mod MR/TR.   CKD (chronic kidney disease), stage IV (HCC)    Colon polyps    Congestive heart disease (HCC)    Esophageal reflux    Glaucoma    Gout    H/O hiatal hernia    History of UTI    Hyperlipidemia    Moderate mitral regurgitation    a. 04/2023 Echo: Mod MR.   Morbid obesity (HCC)    OSA (obstructive sleep apnea)    OSA on CPAP    CPAP   Pacemaker    copy of medtronic card in chart   PAF (paroxysmal atrial fibrillation) (HCC)    a. 10/2023 s/p DCCV (150J); b. CHA2DS2VASc = 5-->warfarin.   Primary hypertension    controlled on meds   Pulmonary hypertension (HCC)    a. 04/2023 Echo: RVSP 44.60mmHg.   Seasonal allergies    SSS (sick sinus syndrome) (HCC)    a. 2008 s/p MDT PPM;  b. 11/2013 Gen change- MDT ADDRL1 Adapta DC PPM, ser # [id number].   Symptomatic PVCs    Syncope and collapse    a. Felt to be vasovagal.   Thyroid  disease  nodules on thyroid , with goiter, on no meds per pt   Tricuspid regurgitation    Type II diabetes mellitus (HCC)    Type 2   Unspecified glaucoma(365.9)    Vertigo    in past   Wears dentures    upper full and lower partial   Past Surgical History:  Procedure Laterality Date   APPENDECTOMY     CARDIAC CATHETERIZATION     CARDIOVERSION N/A 04/26/2017   Procedure: Cardioversion;  Surgeon: Perla Evalene PARAS, MD;  Location: [facility];  Service: Cardiovascular;  Laterality: N/A;   CARDIOVERSION N/A 10/17/2023   Procedure: CARDIOVERSION;  Surgeon: Perla Evalene PARAS, MD;  Location: [facility];  Service: Cardiovascular;  Laterality: N/A;   CARDIOVERSION N/A 12/13/2023   Procedure: CARDIOVERSION;  Surgeon: Perla Evalene PARAS, MD;  Location: [facility];  Service: Cardiovascular;  Laterality: N/A;   CATARACT EXTRACTION W/PHACO Left 07/21/2020   Procedure: CATARACT EXTRACTION PHACO AND  INTRAOCULAR LENS PLACEMENT (IOC) LEFT DIABETIC;  Surgeon: Mittie Gaskin, MD;  Location: [facility];  Service: Ophthalmology;  Laterality: Left;  10.25 1:13.2 14.0%   CATARACT EXTRACTION W/PHACO Right 08/11/2020   Procedure: CATARACT EXTRACTION PHACO AND INTRAOCULAR LENS PLACEMENT (IOC) RIGHT DIABETIC;  Surgeon: Mittie Gaskin, MD;  Location: [facility];  Service: Ophthalmology;  Laterality: Right;  11.63 1:25.0 13.7%   DILATION AND CURETTAGE OF UTERUS     INCISION AND DRAINAGE OF WOUND Right 03/1998   leg; it was like a boil (12/01/2013)   INSERT / REPLACE / REMOVE PACEMAKER  2008; 12/01/2013   MDT ADDRL1 pacemaker - gen change by Dr Fernande 12/01/2013   LUNG BIOPSY  2010   unc   PERMANENT PACEMAKER GENERATOR CHANGE N/A 12/01/2013   Procedure: PERMANENT PACEMAKER GENERATOR CHANGE;  Surgeon: Elspeth JAYSON Fernande, MD;  Location: [facility] C[facility];  Service: Cardiovascular;  Laterality: N/A;   RIGHT HEART CATH N/A 10/23/2023   Procedure: RIGHT HEART CATH;  Surgeon: Gardenia Led, DO;  Location: [facility] INVASIVE CV LAB;  Service: Cardiovascular;  Laterality: N/A;   VAGINAL HYSTERECTOMY  1970   Family History  Problem Relation Age of Onset   Cancer Mother    Liver cancer Mother    Pancreatic cancer Mother    Heart disease Father    Rheum arthritis Father    Allergies Father    Hypertension Sister    Rheum arthritis Sister    COPD Brother    Breast cancer Maternal Aunt    Social History   Socioeconomic History   Marital status: Widowed    Spouse name: Not on file   Number of children: 2   Years of education: Not on file   Highest education level: 12th grade  Occupational History   Occupation: retired  Tobacco Use   Smoking status: Former    Current packs/day: 0.00    Average packs/day: 0.5 packs/day for 34.0 years (17.0 ttl pk-yrs)    Types: Cigarettes    Start date: 08/16/1955    Quit date: 08/15/1989    Years since quitting: 34.3   Smokeless tobacco:  Never  Vaping Use   Vaping status: Never Used  Substance and Sexual Activity   Alcohol use: No    Alcohol/week: 0.0 standard drinks of alcohol   Drug use: No   Sexual activity: Never  Other Topics Concern   Not on file  Social History Narrative   Retired. Widowed. Regularly exercises.    Social Drivers of Corporate Investment Banker Strain: Low Risk  (  10/22/2023)   Overall Financial Resource Strain (CARDIA)    Difficulty of Paying Living Expenses: Not hard at all  Food Insecurity: Patient Declined (10/19/2023)   Hunger Vital Sign    Worried About Running Out of Food in the Last Year: Patient declined    Ran Out of Food in the Last Year: Patient declined  Transportation Needs: No Transportation Needs (10/22/2023)   PRAPARE - Administrator, Civil Service (Medical): No    Lack of Transportation (Non-Medical): No  Physical Activity: Unknown (12/01/2018)   Exercise Vital Sign    Days of Exercise per Week: Patient declined    Minutes of Exercise per Session: Patient declined  Stress: Unknown (12/01/2018)   [employer] of Occupational Health - Occupational Stress Questionnaire    Feeling of Stress : Patient declined  Social Connections: Unknown (12/01/2018)   Social Connection and Isolation Panel [NHANES]    Frequency of Communication with Friends and Family: Patient declined    Frequency of Social Gatherings with Friends and Family: Patient declined    Attends Religious Services: Patient declined    Database Administrator or Organizations: Patient declined    Attends Banker Meetings: Patient declined    Marital Status: Patient declined     Review of Systems  Constitutional:  Negative for appetite change and unexpected weight change.  HENT:  Negative for congestion and sinus pressure.   Respiratory:  Negative for cough, chest tightness and shortness of breath.   Cardiovascular:  Negative for chest pain and palpitations.  Gastrointestinal:   Negative for abdominal pain, diarrhea, nausea and vomiting.  Genitourinary:  Negative for difficulty urinating and dysuria.  Musculoskeletal:  Negative for joint swelling and myalgias.  Skin:  Negative for color change and rash.  Neurological:  Negative for dizziness and headaches.  Psychiatric/Behavioral:  Negative for agitation and dysphoric mood.        Objective:     BP 118/68   Pulse 72   Temp 97.9 F (36.6 C)   Resp 16   Ht 5' 4 (1.626 m)   Wt 237 lb (107.5 kg)   SpO2 98%   BMI 40.68 kg/m  Wt Readings from Last 3 Encounters:  12/13/23 236 lb 15.9 oz (107.5 kg)  12/11/23 237 lb (107.5 kg)  12/07/23 236 lb 8 oz (107.3 kg)    Physical Exam Vitals reviewed.  Constitutional:      General: She is not in acute distress.    Appearance: Normal appearance.  HENT:     Head: Normocephalic and atraumatic.     Right Ear: External ear normal.     Left Ear: External ear normal.     Mouth/Throat:     Pharynx: No oropharyngeal exudate or posterior oropharyngeal erythema.  Eyes:     General: No scleral icterus.       Right eye: No discharge.        Left eye: No discharge.     Conjunctiva/sclera: Conjunctivae normal.  Neck:     Thyroid : No thyromegaly.  Cardiovascular:     Rate and Rhythm: Normal rate and regular rhythm.  Pulmonary:     Effort: No respiratory distress.     Breath sounds: Normal breath sounds. No wheezing.  Abdominal:     General: Bowel sounds are normal.     Palpations: Abdomen is soft.     Tenderness: There is no abdominal tenderness.  Musculoskeletal:        General: No swelling or tenderness.  Cervical back: Neck supple. No tenderness.  Lymphadenopathy:     Cervical: No cervical adenopathy.  Skin:    Findings: No erythema or rash.  Neurological:     Mental Status: She is alert.  Psychiatric:        Mood and Affect: Mood normal.        Behavior: Behavior normal.      Outpatient Encounter Medications as of 12/11/2023  Medication Sig    acetaminophen  (TYLENOL ) 500 MG tablet Take 1,000 mg by mouth every 6 (six) hours as needed for moderate pain.    allopurinol  (ZYLOPRIM ) 100 MG tablet Take 100 mg by mouth daily.   amiodarone  (PACERONE ) 200 MG tablet TAKE 1 TABLET BY MOUTH EVERY DAY   amLODipine  (NORVASC ) 5 MG tablet Take 1 tablet (5 mg total) by mouth daily.   apixaban  (ELIQUIS ) 2.5 MG TABS tablet Take 1 tablet (2.5 mg total) by mouth 2 (two) times daily.   atorvastatin  (LIPITOR) 20 MG tablet TAKE 1 TABLET (20 MG TOTAL) BY MOUTH DAILY.   Blood Glucose Monitoring Suppl (FIFTY50 GLUCOSE METER 2.0) w/Device KIT    calcitRIOL  (ROCALTROL ) 0.25 MCG capsule TAKE 1 CAPSULE (0.25 MCG TOTAL) BY MOUTH 1 (ONE) TIME EACH DAY   carvedilol  (COREG ) 25 MG tablet Take 1-2 tablets (25-50 mg total) by mouth as directed. Take 1 tablet (25MG ) by mouth every morning and 2 tablets (50MG ) by mouth every night before bed (Patient taking differently: Take 25-50 mg by mouth See admin instructions. Take 1 tablet (25MG ) by mouth every morning and 2 tablets (50MG ) by mouth every night before bed)   ferrous sulfate  325 (65 FE) MG EC tablet Take 325 mg by mouth daily.    furosemide  (LASIX ) 20 MG tablet Take 3 tablets (60 mg total) by mouth daily.   Glycerin , Laxative, (FLEET LIQUID GLYCERIN  SUPP RE) Place 1 Dose rectally daily as needed (constipation).   KLOR-CON  M10 10 MEQ tablet TAKE 1 TABLET BY MOUTH ONCE DAILY   Multiple Vitamins-Minerals (CENTRUM SILVER PO) Take 1 tablet by mouth daily.    OneTouch Delica Lancets 33G MISC    ONETOUCH VERIO test strip 3 (three) times daily.   pantoprazole  (PROTONIX ) 40 MG tablet TAKE 1 TABLET BY MOUTH EVERY DAY   polyethylene glycol (MIRALAX  / GLYCOLAX ) packet Take 17 g by mouth daily as needed for moderate constipation.   sitaGLIPtin  (JANUVIA ) 25 MG tablet Take 1 tablet (25 mg total) by mouth daily.   White Petrolatum-Mineral Oil (ARTIFICIAL TEARS) ointment Place 1 drop into both eyes daily as needed (Dry eyes).   No  facility-administered encounter medications on file as of 12/11/2023.     Lab Results  Component Value Date   WBC 6.4 12/07/2023   HGB 10.5 (L) 12/07/2023   HCT 33.4 (L) 12/07/2023   PLT 141 (L) 12/07/2023   GLUCOSE 248 (H) 12/07/2023   CHOL 150 11/09/2023   TRIG 81.0 11/09/2023   HDL 49.30 11/09/2023   LDLCALC 84 11/09/2023   ALT 18 11/09/2023   AST 20 11/09/2023   NA 142 12/07/2023   K 4.1 12/07/2023   CL 102 12/07/2023   CREATININE 2.99 (H) 12/07/2023   BUN 30 (H) 12/07/2023   CO2 26 12/07/2023   TSH 0.42 11/09/2023   INR 1.8 (H) 11/09/2023   HGBA1C 7.2 09/26/2023    ECHOCARDIOGRAM COMPLETE Result Date: 10/20/2023    ECHOCARDIOGRAM REPORT   Patient Name:   KARMYN LOWMAN Date of Exam: 10/20/2023 Medical Rec #:  [id number]  Height:       64.0 in Accession #:    [id number]   Weight:       239.4 lb Date of Birth:  [birth year]   BSA:          2.112 m Patient Age:    87 years     BP:           130/64 mmHg Patient Gender: F            HR:           60 bpm. Exam Location:  [facility] Procedure: 2D Echo Indications:     CHF I50.31  History:         Patient has prior history of Echocardiogram examinations, most                  recent 04/25/2023.  Sonographer:     Thedora Louder RDCS, FASE Referring Phys:  [id number] LANETA BLUNT Diagnosing Phys: Evalene Lunger MD IMPRESSIONS  1. Left ventricular ejection fraction, by estimation, is 55 to 60%. The left ventricle has normal function. The left ventricle has no regional wall motion abnormalities. Left ventricular diastolic parameters are indeterminate.  2. Right ventricular systolic function is low normal. The right ventricular size is mildly enlarged. There is severely elevated pulmonary artery systolic pressure. The estimated right ventricular systolic pressure is 62.8 mmHg.  3. Left atrial size was mildly dilated.  4. The mitral valve is normal in structure. No evidence of mitral valve regurgitation. No evidence of mitral stenosis.  5. Tricuspid  valve regurgitation is moderate.  6. The aortic valve is normal in structure. Aortic valve regurgitation is not visualized. No aortic stenosis is present.  7. The inferior vena cava is normal in size with greater than 50% respiratory variability, suggesting right atrial pressure of 3 mmHg. FINDINGS  Left Ventricle: Left ventricular ejection fraction, by estimation, is 55 to 60%. The left ventricle has normal function. The left ventricle has no regional wall motion abnormalities. The left ventricular internal cavity size was normal in size. There is  no left ventricular hypertrophy. Left ventricular diastolic parameters are indeterminate. Right Ventricle: The right ventricular size is mildly enlarged. No increase in right ventricular wall thickness. Right ventricular systolic function is low normal. There is severely elevated pulmonary artery systolic pressure. The tricuspid regurgitant velocity is 3.80 m/s, and with an assumed right atrial pressure of 5 mmHg, the estimated right ventricular systolic pressure is 62.8 mmHg. Left Atrium: Left atrial size was mildly dilated. Right Atrium: Right atrial size was normal in size. Pericardium: There is no evidence of pericardial effusion. Mitral Valve: The mitral valve is normal in structure. No evidence of mitral valve regurgitation. No evidence of mitral valve stenosis. Tricuspid Valve: The tricuspid valve is normal in structure. Tricuspid valve regurgitation is moderate . No evidence of tricuspid stenosis. Aortic Valve: The aortic valve is normal in structure. Aortic valve regurgitation is not visualized. No aortic stenosis is present. Aortic valve peak gradient measures 9.7 mmHg. Pulmonic Valve: The pulmonic valve was normal in structure. Pulmonic valve regurgitation is mild. No evidence of pulmonic stenosis. Aorta: The aortic root is normal in size and structure. Venous: The inferior vena cava is normal in size with greater than 50% respiratory variability, suggesting  right atrial pressure of 3 mmHg. IAS/Shunts: No atrial level shunt detected by color flow Doppler. Additional Comments: A device lead is visualized.  LEFT VENTRICLE PLAX 2D LVIDd:         4.10  cm   Diastology LVIDs:         2.90 cm   LV e' medial:    13.40 cm/s LV PW:         1.30 cm   LV E/e' medial:  9.2 LV IVS:        1.00 cm   LV e' lateral:   14.10 cm/s LVOT diam:     1.80 cm   LV E/e' lateral: 8.7 LV SV:         55 LV SV Index:   26 LVOT Area:     2.54 cm  RIGHT VENTRICLE RV Basal diam:  3.00 cm RV S prime:     16.60 cm/s TAPSE (M-mode): 2.1 cm LEFT ATRIUM           Index        RIGHT ATRIUM           Index LA diam:      4.90 cm 2.32 cm/m   RA Area:     17.40 cm LA Vol (A2C): 62.0 ml 29.36 ml/m  RA Volume:   43.30 ml  20.50 ml/m LA Vol (A4C): 67.1 ml 31.77 ml/m  AORTIC VALVE                 PULMONIC VALVE AV Area (Vmax): 1.84 cm     PV Vmax:          0.96 m/s AV Vmax:        156.00 cm/s  PV Peak grad:     3.7 mmHg AV Peak Grad:   9.7 mmHg     PR End Diast Vel: 6.45 msec LVOT Vmax:      113.00 cm/s LVOT Vmean:     75.000 cm/s LVOT VTI:       0.218 m  AORTA Ao Root diam: 3.00 cm Ao Asc diam:  3.00 cm MITRAL VALVE                TRICUSPID VALVE MV Area (PHT): 3.34 cm     TR Peak grad:   57.8 mmHg MV Decel Time: 227 msec     TR Vmax:        380.00 cm/s MV E velocity: 123.00 cm/s MV A velocity: 35.10 cm/s   SHUNTS MV E/A ratio:  3.50         Systemic VTI:  0.22 m                             Systemic Diam: 1.80 cm Evalene Lunger MD Electronically signed by Evalene Lunger MD Signature Date/Time: 10/20/2023/3:16:40 PM    Final        Assessment & Plan:  SOB (shortness of breath) Assessment & Plan: Recently admitted for worsening sob as outlined. Doing better now.  On oral lasix  - 60mg  q day.  Have discussed importance of weighing daily.  Call with update. Continue f/u with cardiology.    Secondary hyperparathyroidism of renal origin [facility]) Assessment & Plan: Followed by Dr Dominica.    Pulmonary  hypertension, unspecified (HCC) Assessment & Plan: Has f/u with cardiology.    PAF (paroxysmal atrial fibrillation) (HCC) Assessment & Plan: Continues on amiodarone .  Now off coumadin .  On eliquis .  Heart rate controlled.  Follow.    OSA (obstructive sleep apnea) Assessment & Plan: CPAP. F/u with pulmonary.    Multinodular goiter Assessment & Plan: Followed by Dr Cherilyn - thyroid  - has been followed  with serial ultrasounds.  Previous biopsy ok.    Severe obesity (BMI >= 40) (HCC) Assessment & Plan: Follow diet and exercise as tolerated.    Mixed hyperlipidemia Assessment & Plan: On lipitor.  Low cholesterol diet and exercise.  Follow lipid panel and liver function tests.    Hemoglobin C disease (HCC) Assessment & Plan: Followed by hematology.    Essential hypertension Assessment & Plan: Continues on carvedilol , amlodipine  and furosemide .  No changes today.  Follow pressures.    Controlled type 2 diabetes mellitus without complication, without long-term current use of insulin  [facility]) Assessment & Plan: Has been seeing Dr Cherilyn for her diabetes and f/u multinodular goiter. She was previously having some intermittent low sugars.  Not taking glipizide  now. We discussed remaining off this medication, given the low sugars and her renal function. She did have pancreatitis in the past, so will avoid GLP - 1 agonist. Given her renal function, she is unable to use SGLT-2 and metformin . Reviewed outside sugar readings as outlined. Will allow to run a little higher. Discussed with her today. Hold on making changes.  Follow.    CKD (chronic kidney disease) stage 4, GFR 15-29 ml/min (HCC) Assessment & Plan: Followed by Dr Dominica.    Chronic diastolic heart failure (HCC) Assessment & Plan: Taking lasix  daily. Sees Dr Gollan. Continue current medications as outlined. Have discussed continuing daily weights.  Monitor salt/sodium intake. Keep f/u with cardiology. No changes  today.    Anemia due to chronic renal failure treated with erythropoietin, stage 4 (severe) (HCC) Assessment & Plan: Being followed by Dr Melanee.  Last labs did not reveal iron deficiency.  Has been taking oral iron.  Constipation. Keeping bowels moving. Continue f/u Dr Melanee.  Hgb in hospital 9.5.       Allena Hamilton, MD

## 2023-12-12 DIAGNOSIS — I129 Hypertensive chronic kidney disease with stage 1 through stage 4 chronic kidney disease, or unspecified chronic kidney disease: Secondary | ICD-10-CM | POA: Insufficient documentation

## 2023-12-12 DIAGNOSIS — D631 Anemia in chronic kidney disease: Secondary | ICD-10-CM | POA: Diagnosis not present

## 2023-12-12 DIAGNOSIS — N2581 Secondary hyperparathyroidism of renal origin: Secondary | ICD-10-CM | POA: Diagnosis not present

## 2023-12-12 DIAGNOSIS — E1122 Type 2 diabetes mellitus with diabetic chronic kidney disease: Secondary | ICD-10-CM | POA: Diagnosis not present

## 2023-12-12 DIAGNOSIS — E785 Hyperlipidemia, unspecified: Secondary | ICD-10-CM | POA: Diagnosis not present

## 2023-12-12 DIAGNOSIS — N184 Chronic kidney disease, stage 4 (severe): Secondary | ICD-10-CM | POA: Diagnosis not present

## 2023-12-13 ENCOUNTER — Other Ambulatory Visit: Payer: Self-pay

## 2023-12-13 ENCOUNTER — Ambulatory Visit: Payer: PPO | Admitting: Certified Registered Nurse Anesthetist

## 2023-12-13 ENCOUNTER — Encounter: Payer: Self-pay | Admitting: Cardiovascular Disease

## 2023-12-13 ENCOUNTER — Encounter
Admission: RE | Disposition: A | Discharge status: Home / Self Care | Payer: Self-pay | Source: Home / Self Care | Attending: Cardiovascular Disease

## 2023-12-13 ENCOUNTER — Ambulatory Visit
Admission: RE | Admit: 2023-12-13 | Discharge: 2023-12-13 | Disposition: A | Discharge status: Home / Self Care | Payer: PPO | Attending: Cardiovascular Disease | Admitting: Cardiovascular Disease

## 2023-12-13 DIAGNOSIS — Z79899 Other long term (current) drug therapy: Secondary | ICD-10-CM | POA: Insufficient documentation

## 2023-12-13 DIAGNOSIS — I4892 Unspecified atrial flutter: Secondary | ICD-10-CM | POA: Diagnosis not present

## 2023-12-13 DIAGNOSIS — Z87891 Personal history of nicotine dependence: Secondary | ICD-10-CM | POA: Diagnosis not present

## 2023-12-13 DIAGNOSIS — I4819 Other persistent atrial fibrillation: Secondary | ICD-10-CM | POA: Diagnosis not present

## 2023-12-13 DIAGNOSIS — I480 Paroxysmal atrial fibrillation: Secondary | ICD-10-CM | POA: Diagnosis not present

## 2023-12-13 DIAGNOSIS — I5032 Chronic diastolic (congestive) heart failure: Secondary | ICD-10-CM | POA: Insufficient documentation

## 2023-12-13 DIAGNOSIS — I130 Hypertensive heart and chronic kidney disease with heart failure and stage 1 through stage 4 chronic kidney disease, or unspecified chronic kidney disease: Secondary | ICD-10-CM | POA: Insufficient documentation

## 2023-12-13 DIAGNOSIS — D6859 Other primary thrombophilia: Secondary | ICD-10-CM | POA: Diagnosis not present

## 2023-12-13 DIAGNOSIS — E1122 Type 2 diabetes mellitus with diabetic chronic kidney disease: Secondary | ICD-10-CM | POA: Diagnosis not present

## 2023-12-13 DIAGNOSIS — G4733 Obstructive sleep apnea (adult) (pediatric): Secondary | ICD-10-CM | POA: Diagnosis not present

## 2023-12-13 DIAGNOSIS — E6689 Other obesity not elsewhere classified: Secondary | ICD-10-CM | POA: Diagnosis not present

## 2023-12-13 DIAGNOSIS — Z7901 Long term (current) use of anticoagulants: Secondary | ICD-10-CM | POA: Insufficient documentation

## 2023-12-13 DIAGNOSIS — D631 Anemia in chronic kidney disease: Secondary | ICD-10-CM | POA: Diagnosis not present

## 2023-12-13 DIAGNOSIS — N184 Chronic kidney disease, stage 4 (severe): Secondary | ICD-10-CM | POA: Diagnosis not present

## 2023-12-13 DIAGNOSIS — I4891 Unspecified atrial fibrillation: Secondary | ICD-10-CM | POA: Diagnosis not present

## 2023-12-13 DIAGNOSIS — Z950 Presence of cardiac pacemaker: Secondary | ICD-10-CM | POA: Insufficient documentation

## 2023-12-13 DIAGNOSIS — I440 Atrioventricular block, first degree: Secondary | ICD-10-CM | POA: Diagnosis not present

## 2023-12-13 DIAGNOSIS — I5033 Acute on chronic diastolic (congestive) heart failure: Secondary | ICD-10-CM | POA: Diagnosis not present

## 2023-12-13 HISTORY — PX: CARDIOVERSION: SHX1299

## 2023-12-13 LAB — GLUCOSE, CAPILLARY: Glucose-Capillary: 182 mg/dL — ABNORMAL HIGH (ref 70–99)

## 2023-12-13 SURGERY — CARDIOVERSION
Anesthesia: General

## 2023-12-13 MED ORDER — PROPOFOL 10 MG/ML IV BOLUS
INTRAVENOUS | Status: DC | PRN
  Administered 2023-12-13: 50 mg via INTRAVENOUS

## 2023-12-13 MED ORDER — SODIUM CHLORIDE 0.9 % IV SOLN: INTRAVENOUS | Status: DC

## 2023-12-13 NOTE — CV Procedure (Signed)
 Cardioversion procedure note For atrial fibrillation/flutter, persistent.  Procedure Details:  Consent: Risks of procedure as well as the alternatives and risks of each were explained to the (patient/caregiver).  Consent for procedure obtained.  Time Out: Verified patient identification, verified procedure, site/side was marked, verified correct patient position, special equipment/implants available, medications/allergies/relevent history reviewed, required imaging and test results available.  Performed  Patient placed on cardiac monitor, pulse oximetry, supplemental oxygen as necessary.   Sedation given: propofol  IV, Dr. Boone Pacer pads placed anterior and posterior chest.   Cardioverted 1 time(s).   Cardioverted at  150 J. Synchronized biphasic Converted to NSR   Evaluation: Findings: Post procedure EKG shows: NSR, AV paced Complications: None Patient did tolerate procedure well.  Time Spent Directly with the Patient:  45 minutes   Tim Nereida Schepp, M.D., Ph.D.

## 2023-12-13 NOTE — Anesthesia Postprocedure Evaluation (Signed)
 Anesthesia Post Note  Patient: [name] [name] Sar  Procedure(s) Performed: CARDIOVERSION  Patient location during evaluation: Specials Recovery Anesthesia Type: General Level of consciousness: awake and alert Pain management: pain level controlled Vital Signs Assessment: post-procedure vital signs reviewed and stable Respiratory status: spontaneous breathing, nonlabored ventilation, respiratory function stable and patient connected to nasal cannula oxygen Cardiovascular status: blood pressure returned to baseline and stable Postop Assessment: no apparent nausea or vomiting Anesthetic complications: no   No notable events documented.   Last Vitals:  Vitals:   12/13/23 0815 12/13/23 0830  BP: 135/71 (!) 141/72  Pulse: 61 65  Resp: 15 (!) 21  Temp:    SpO2: 98% 98%    Last Pain:  Vitals:   12/13/23 0830  TempSrc:   PainSc: 0-No pain                 Rome Ade

## 2023-12-13 NOTE — Anesthesia Preprocedure Evaluation (Signed)
 Anesthesia Evaluation  Patient identified by MRN, date of birth, ID band Patient awake    Reviewed: Allergy & Precautions, NPO status , Patient's Chart, lab work & pertinent test results, reviewed documented beta blocker date and time   History of Anesthesia Complications Negative for: history of anesthetic complications  Airway Mallampati: IV  TM Distance: >3 FB Neck ROM: Full    Dental  (+) Upper Dentures, Partial Lower   Pulmonary sleep apnea and Continuous Positive Airway Pressure Ventilation , neg COPD, Patient abstained from smoking.Not current smoker, former smoker   Pulmonary exam normal breath sounds clear to auscultation       Cardiovascular Exercise Tolerance: Good METShypertension, (-) CAD and (-) Past MI Normal cardiovascular exam+ dysrhythmias (Last coumadin 07/20/2020) Atrial Fibrillation + pacemaker  Rhythm:Regular Rate:Normal - Systolic murmurs ECHO 12/2018 - Left ventricle: The cavity size was normal. Wall thickness was increased in a pattern of mild LVH. Systolic function was normal. The estimated ejection fraction was in the range of 55% to 65%.  - Mitral valve: There was mild regurgitation.  - Left atrium: The atrium was mildly dilated.   Neuro/Psych   Anxiety     negative neurological ROS  negative psych ROS   GI/Hepatic ,GERD  Controlled,,(+)     (-) substance abuse    Endo/Other  diabetes, Type 2  Class 4 obesity  Renal/GU CRFRenal disease     Musculoskeletal  (+) Arthritis ,    Abdominal  (+) + obese  Peds  Hematology  (+) Blood dyscrasia, anemia   Anesthesia Other Findings Past Medical History: No date: 1st degree AV block     Comment:  a. PR 400 msec with Apacing No date: A-fib (HCC)     Comment:  Hx of No date: Anemia No date: Anxiety No date: Arthritis     Comment:  knees and lower back  No date: Chest pain     Comment:  a. 2004 Cath:  reportedly nl;  b. 09/2013 Neg MV. No date:  Chronic diastolic heart failure (HCC)     Comment:  a. 09/2013 Echo: EF 50-55%, mild LVH, mild to mod MR/TR. No date: Chronic kidney disease     Comment:  Stage IV No date: Colon polyps No date: Congestive heart disease (HCC) No date: Esophageal reflux No date: Glaucoma No date: Gout No date: H/O hiatal hernia No date: History of UTI No date: Hyperlipidemia No date: Morbid obesity (HCC) No date: OSA on CPAP     Comment:  CPAP No date: Pacemaker     Comment:  copy of medtronic card in chart No date: PAF (paroxysmal atrial fibrillation) (HCC) No date: Seasonal allergies No date: SSS (sick sinus syndrome) (HCC)     Comment:  a. 2008 s/p MDT PPM;  b. 11/2013 Gen change- MDT ADDRL1               Adapta DC PPM, ser # [id number] H. No date: Symptomatic PVCs No date: Syncope and collapse     Comment:  a. Felt to be vasovagal. No date: Thyroid disease     Comment:  nodules on thyroid, with goiter, on no meds per pt No date: Type II diabetes mellitus (HCC)     Comment:  Type 2 No date: Unspecified essential hypertension     Comment:  controlled on meds No date: Unspecified glaucoma(365.9) No date: Vertigo     Comment:  in past No date: Wears dentures     Comment:  upper full and lower  partial  Reproductive/Obstetrics                             Anesthesia Physical Anesthesia Plan  ASA: 3  Anesthesia Plan: General   Post-op Pain Management: Minimal or no pain anticipated   Induction: Intravenous  PONV Risk Score and Plan: 3 and Ondansetron, TIVA, Midazolam and Treatment may vary due to age or medical condition  Airway Management Planned: Nasal Cannula and Natural Airway  Additional Equipment: None  Intra-op Plan:   Post-operative Plan:   Informed Consent: I have reviewed the patients History and Physical, chart, labs and discussed the procedure including the risks, benefits and alternatives for the proposed anesthesia with the patient or  authorized representative who has indicated his/her understanding and acceptance.     Dental advisory given  Plan Discussed with: CRNA  Anesthesia Plan Comments: (Discussed risks of anesthesia with patient, including possibility of difficulty with spontaneous ventilation under anesthesia necessitating airway intervention, PONV, and rare risks such as cardiac or respiratory or neurological events, and allergic reactions. Discussed the role of CRNA in patient's perioperative care. Patient understands.)        Anesthesia Quick Evaluation

## 2023-12-13 NOTE — Transfer of Care (Signed)
 Immediate Anesthesia Transfer of Care Note  Patient: [name] [name]  Procedure(s) Performed: CARDIOVERSION  Patient Location:  Spec[facility] 15  Anesthesia Type:General  Level of Consciousness: drowsy  Airway & Oxygen Therapy: Patient Spontanous Breathing and Patient connected to nasal cannula oxygen  Post-op Assessment: Report given to RN and Post -op Vital signs reviewed and stable  Post vital signs: Reviewed and stable  Last Vitals:  Vitals Value Taken Time  BP 137/79 12/13/23 0751  Temp 36.4 C 12/13/23 0739  Pulse 80 12/13/23 0752  Resp 19 12/13/23 0752  SpO2 96 % 12/13/23 0752    Last Pain:  Vitals:   12/13/23 0739  TempSrc: Oral  PainSc: 0-No pain         Complications: No notable events documented.

## 2023-12-14 ENCOUNTER — Telehealth: Payer: Self-pay | Admitting: Internal Medicine

## 2023-12-14 NOTE — Telephone Encounter (Signed)
 Noted.

## 2023-12-15 ENCOUNTER — Encounter: Payer: Self-pay | Admitting: Cardiovascular Disease

## 2023-12-15 NOTE — Interval H&P Note (Signed)
 History and Physical Interval Note:  12/15/2023 5:16 PM  [name] [name]  has presented today for surgery, with the diagnosis of Cardioversion   Afib  OK 2nd case Megan Anesthesia   Start time 8a.  The various methods of treatment have been discussed with the patient and family. After consideration of risks, benefits and other options for treatment, the patient has consented to  Procedure(s): CARDIOVERSION (N/A) as a surgical intervention.  The patient's history has been reviewed, patient examined, no change in status, stable for surgery.  I have reviewed the patient's chart and labs.  Questions were answered to the patient's satisfaction.     [name]

## 2023-12-15 NOTE — H&P (Signed)

## 2023-12-16 ENCOUNTER — Encounter: Payer: Self-pay | Admitting: Internal Medicine

## 2023-12-16 NOTE — Assessment & Plan Note (Signed)
 Followed by hematology

## 2023-12-16 NOTE — Assessment & Plan Note (Signed)
 Continues on amiodarone.  Now off coumadin.  On eliquis.  Heart rate controlled.  Follow.

## 2023-12-16 NOTE — Assessment & Plan Note (Signed)
 Has f/u with cardiology.

## 2023-12-16 NOTE — Assessment & Plan Note (Signed)
 Continues on carvedilol, amlodipine and furosemide.  No changes today.  Follow pressures.

## 2023-12-16 NOTE — Assessment & Plan Note (Signed)
 Taking lasix daily. Sees Dr Mariah Milling. Continue current medications as outlined. Have discussed continuing daily weights.  Monitor salt/sodium intake. Keep f/u with cardiology. No changes today.

## 2023-12-16 NOTE — Assessment & Plan Note (Signed)
 Followed by Dr Suezanne Jacquet.

## 2023-12-16 NOTE — Assessment & Plan Note (Signed)
 CPAP. F/u with pulmonary.

## 2023-12-16 NOTE — Assessment & Plan Note (Signed)
 Recently admitted for worsening sob as outlined. Doing better now.  On oral lasix - 60mg  q day.  Have discussed importance of weighing daily.  Call with update. Continue f/u with cardiology.

## 2023-12-16 NOTE — Assessment & Plan Note (Signed)
 Follow diet and exercise as tolerated.

## 2023-12-16 NOTE — Assessment & Plan Note (Signed)
 Followed by Dr Gershon Crane - thyroid - has been followed with serial ultrasounds.  Previous biopsy ok.

## 2023-12-16 NOTE — Assessment & Plan Note (Signed)
 Being followed by Dr Smith Robert.  Last labs did not reveal iron deficiency.  Has been taking oral iron.  Constipation. Keeping bowels moving. Continue f/u Dr Smith Robert.  Hgb in hospital 9.5.

## 2023-12-16 NOTE — Assessment & Plan Note (Signed)
 On lipitor.  Low cholesterol diet and exercise.  Follow lipid panel and liver function tests.

## 2023-12-16 NOTE — Assessment & Plan Note (Signed)
 Has been seeing Dr Cherilyn for her diabetes and f/u multinodular goiter. She was previously having some intermittent low sugars.  Not taking glipizide  now. We discussed remaining off this medication, given the low sugars and her renal function. She did have pancreatitis in the past, so will avoid GLP - 1 agonist. Given her renal function, she is unable to use SGLT-2 and metformin . Reviewed outside sugar readings as outlined. Will allow to run a little higher. Discussed with her today. Hold on making changes.  Follow.

## 2023-12-17 ENCOUNTER — Other Ambulatory Visit: Payer: Self-pay | Admitting: Internal Medicine

## 2023-12-17 NOTE — Telephone Encounter (Unsigned)
 Copied from CRM [id number]. Topic: Clinical - Medication Refill >> Dec 17, 2023  9:02 AM Leotis ORN wrote: Most Recent Primary Care Visit:  Provider: GLENDIA SHAD  Department: [facility]  Visit Type: OFFICE VISIT  Date: 12/11/2023  Medication: ONETOUCH VERIO test strip,  Blood Glucose Monitoring Suppl (FIFTY50 GLUCOSE METER 2.0) w/Device KIT       Has the patient contacted their pharmacy? Yes (Agent: If no, request that the patient contact the pharmacy for the refill. If patient does not wish to contact the pharmacy document the reason why and proceed with request.) (Agent: If yes, when and what did the pharmacy advise?)  Is this the correct pharmacy for this prescription? Yes If no, delete pharmacy and type the correct one.  This is the patient's preferred pharmacy:  [id number] [place], [place] - [address] [place] [address] Phone: [phone] Fax: [phone]   Has the prescription been filled recently? Yes  Is the patient out of the medication? Yes  Has the patient been seen for an appointment in the last year OR does the patient have an upcoming appointment? Yes  Can we respond through MyChart? Yes  Agent: Please be advised that Rx refills may take up to 3 business days. We ask that you follow-up with your pharmacy.

## 2023-12-18 DIAGNOSIS — H353221 Exudative age-related macular degeneration, left eye, with active choroidal neovascularization: Secondary | ICD-10-CM | POA: Diagnosis not present

## 2023-12-21 ENCOUNTER — Other Ambulatory Visit: Payer: Self-pay | Admitting: Internal Medicine

## 2023-12-21 ENCOUNTER — Other Ambulatory Visit (HOSPITAL_BASED_OUTPATIENT_CLINIC_OR_DEPARTMENT_OTHER): Payer: Self-pay | Admitting: Cardiology

## 2023-12-26 ENCOUNTER — Other Ambulatory Visit: Payer: Self-pay

## 2023-12-26 DIAGNOSIS — D631 Anemia in chronic kidney disease: Secondary | ICD-10-CM

## 2023-12-27 DIAGNOSIS — N184 Chronic kidney disease, stage 4 (severe): Secondary | ICD-10-CM | POA: Diagnosis not present

## 2023-12-28 ENCOUNTER — Inpatient Hospital Stay: Payer: PPO | Attending: Oncology

## 2023-12-28 DIAGNOSIS — N184 Chronic kidney disease, stage 4 (severe): Secondary | ICD-10-CM | POA: Diagnosis not present

## 2023-12-28 DIAGNOSIS — D631 Anemia in chronic kidney disease: Secondary | ICD-10-CM | POA: Insufficient documentation

## 2023-12-28 LAB — CBC (CANCER CENTER ONLY)
HCT: 30.8 % — ABNORMAL LOW (ref 36.0–46.0)
Hemoglobin: 10.4 g/dL — ABNORMAL LOW (ref 12.0–15.0)
MCH: 26.3 pg (ref 26.0–34.0)
MCHC: 33.8 g/dL (ref 30.0–36.0)
MCV: 78 fL — ABNORMAL LOW (ref 80.0–100.0)
Platelet Count: 128 10*3/uL — ABNORMAL LOW (ref 150–400)
RBC: 3.95 MIL/uL (ref 3.87–5.11)
RDW: 15.6 % — ABNORMAL HIGH (ref 11.5–15.5)
WBC Count: 5.9 10*3/uL (ref 4.0–10.5)
nRBC: 0 % (ref 0.0–0.2)

## 2023-12-28 LAB — IRON AND TIBC
Iron: 50 ug/dL (ref 28–170)
Saturation Ratios: 17 % (ref 10.4–31.8)
TIBC: 291 ug/dL (ref 250–450)
UIBC: 241 ug/dL

## 2023-12-28 LAB — FERRITIN: Ferritin: 119 ng/mL (ref 11–307)

## 2023-12-31 ENCOUNTER — Encounter: Payer: Self-pay | Admitting: Cardiology

## 2023-12-31 ENCOUNTER — Ambulatory Visit: Payer: PPO | Attending: Cardiology | Admitting: Cardiology

## 2023-12-31 VITALS — BP 111/55 | HR 73 | Wt 237.1 lb

## 2023-12-31 DIAGNOSIS — Z79899 Other long term (current) drug therapy: Secondary | ICD-10-CM | POA: Insufficient documentation

## 2023-12-31 DIAGNOSIS — I495 Sick sinus syndrome: Secondary | ICD-10-CM | POA: Insufficient documentation

## 2023-12-31 DIAGNOSIS — N184 Chronic kidney disease, stage 4 (severe): Secondary | ICD-10-CM | POA: Diagnosis not present

## 2023-12-31 DIAGNOSIS — E1122 Type 2 diabetes mellitus with diabetic chronic kidney disease: Secondary | ICD-10-CM | POA: Insufficient documentation

## 2023-12-31 DIAGNOSIS — Z7984 Long term (current) use of oral hypoglycemic drugs: Secondary | ICD-10-CM | POA: Diagnosis not present

## 2023-12-31 DIAGNOSIS — I4892 Unspecified atrial flutter: Secondary | ICD-10-CM | POA: Insufficient documentation

## 2023-12-31 DIAGNOSIS — I5032 Chronic diastolic (congestive) heart failure: Secondary | ICD-10-CM | POA: Diagnosis not present

## 2023-12-31 DIAGNOSIS — G4733 Obstructive sleep apnea (adult) (pediatric): Secondary | ICD-10-CM | POA: Diagnosis not present

## 2023-12-31 DIAGNOSIS — Z87891 Personal history of nicotine dependence: Secondary | ICD-10-CM | POA: Diagnosis not present

## 2023-12-31 DIAGNOSIS — I130 Hypertensive heart and chronic kidney disease with heart failure and stage 1 through stage 4 chronic kidney disease, or unspecified chronic kidney disease: Secondary | ICD-10-CM | POA: Diagnosis not present

## 2023-12-31 DIAGNOSIS — Z87440 Personal history of urinary (tract) infections: Secondary | ICD-10-CM | POA: Insufficient documentation

## 2023-12-31 DIAGNOSIS — Z950 Presence of cardiac pacemaker: Secondary | ICD-10-CM | POA: Insufficient documentation

## 2023-12-31 DIAGNOSIS — I480 Paroxysmal atrial fibrillation: Secondary | ICD-10-CM | POA: Diagnosis not present

## 2023-12-31 DIAGNOSIS — I4891 Unspecified atrial fibrillation: Secondary | ICD-10-CM | POA: Diagnosis not present

## 2023-12-31 DIAGNOSIS — Z7901 Long term (current) use of anticoagulants: Secondary | ICD-10-CM | POA: Insufficient documentation

## 2023-12-31 DIAGNOSIS — I5033 Acute on chronic diastolic (congestive) heart failure: Secondary | ICD-10-CM | POA: Diagnosis not present

## 2023-12-31 MED ORDER — FUROSEMIDE 20 MG PO TABS: 80.0000 mg | ORAL_TABLET | Freq: Every day | ORAL | 3 refills | Status: DC

## 2023-12-31 NOTE — Progress Notes (Addendum)
ADVANCED HEART FAILURE CLINIC NOTE  Referring Physician: Dale [place], MD  Primary Care: Dale [place], MD  HPI: [name] [name] is a 89 y.o. female with HFpEF, paroxysmal atrial fibrillation, sick sinus syndrome status post permanent pacemaker, hypertension, OSA, CKD stage IV presenting today for follow up.  She establish care with heart failure in November 2024 when she was admitted with dyspnea on exertion and chest pressure.  At that time patient was found to be in atrial fibrillation with volume overload.  Echocardiogram during admission with EF of 55 to 60% but with severely elevated RVSP.  Patient was aggressively diuresed and underwent right heart catheterization which demonstrated preserved cardiac index and PA mean of 28 mmHg.  Interval hx:  - She underwent cardioversion on 12/13/23.  - Reports that overall she has been feeling better since her hospitalization 2-3 months ago. She does have some shortness of breath with exertion and 2+ lower extremity edema. Reports 3 bottles of water daily (16.9oz bottles).  - Reports some improvement in functional status; "I don't feel as tired" since undergoing cardioversion two weeks ago.    Past Medical History:  Diagnosis Date   1st degree AV block    a. PR 400 msec with Apacing   A-fib (HCC)    Hx of   Anemia    Anxiety    Arthritis    knees and lower back    Chest pain    a. 2004 Cath:  reportedly nl;  b. 09/2013 Neg MV; c. 11/2017 nl ETT; d. 08/2023 MV: No isch/infarct. nl LV fxn. Mild Ao Ca2+.   Chronic diastolic heart failure (HCC)    a. 09/2013 Echo: EF 50-55%, mild LVH, mild to mod MR/TR; b. 04/2023 Echo: EF 55-60%, no rwma, nl RV size/fxn, RVSP 44.9mmHg. Mod dil LA. Mod MR/TR.   CKD (chronic kidney disease), stage IV (HCC)    Colon polyps    Congestive heart disease (HCC)    Esophageal reflux    Glaucoma    Gout    H/O hiatal hernia    History of UTI    Hyperlipidemia    Moderate mitral regurgitation    a. 04/2023 Echo:  Mod MR.   Morbid obesity (HCC)    OSA (obstructive sleep apnea)    OSA on CPAP    CPAP   Pacemaker    copy of medtronic card in chart   PAF (paroxysmal atrial fibrillation) (HCC)    a. 10/2023 s/p DCCV (150J); b. CHA2DS2VASc = 5-->warfarin.   Primary hypertension    controlled on meds   Pulmonary hypertension (HCC)    a. 04/2023 Echo: RVSP 44.2mmHg.   Seasonal allergies    SSS (sick sinus syndrome) (HCC)    a. 2008 s/p MDT PPM;  b. 11/2013 Gen change- MDT ADDRL1 Adapta DC PPM, ser # [id number] H.   Symptomatic PVCs    Syncope and collapse    a. Felt to be vasovagal.   Thyroid disease    nodules on thyroid, with goiter, on no meds per pt   Tricuspid regurgitation    Type II diabetes mellitus (HCC)    Type 2   Unspecified glaucoma(365.9)    Vertigo    in past   Wears dentures    upper full and lower partial    Current Outpatient Medications  Medication Sig Dispense Refill   acetaminophen (TYLENOL) 500 MG tablet Take 1,000 mg by mouth every 6 (six) hours as needed for moderate pain.  allopurinol (ZYLOPRIM) 100 MG tablet Take 100 mg by mouth daily.     amiodarone (PACERONE) 200 MG tablet TAKE 1 TABLET BY MOUTH EVERY DAY 90 tablet 3   amLODipine (NORVASC) 5 MG tablet Take 1 tablet (5 mg total) by mouth daily. 90 tablet 1   apixaban (ELIQUIS) 2.5 MG TABS tablet Take 1 tablet (2.5 mg total) by mouth 2 (two) times daily. 60 tablet 5   atorvastatin (LIPITOR) 20 MG tablet TAKE 1 TABLET (20 MG TOTAL) BY MOUTH DAILY. 90 tablet 3   Blood Glucose Monitoring Suppl (FIFTY50 GLUCOSE METER 2.0) w/Device KIT      calcitRIOL (ROCALTROL) 0.25 MCG capsule TAKE 1 CAPSULE (0.25 MCG TOTAL) BY MOUTH 1 (ONE) TIME EACH DAY     carvedilol (COREG) 25 MG tablet Take 1-2 tablets (25-50 mg total) by mouth as directed. Take 1 tablet (25MG ) by mouth every morning and 2 tablets (50MG ) by mouth every night before bed (Patient taking differently: Take 25-50 mg by mouth See admin instructions. Take 1 tablet  (25MG ) by mouth every morning and 2 tablets (50MG ) by mouth every night before bed) 270 tablet 3   ferrous sulfate 325 (65 FE) MG EC tablet Take 325 mg by mouth daily.      furosemide (LASIX) 20 MG tablet Take 3 tablets (60 mg total) by mouth daily. 90 tablet 3   Glycerin, Laxative, (FLEET LIQUID GLYCERIN SUPP RE) Place 1 Dose rectally daily as needed (constipation).     KLOR-CON M10 10 MEQ tablet TAKE 1 TABLET BY MOUTH ONCE DAILY 90 tablet 1   Multiple Vitamins-Minerals (CENTRUM SILVER PO) Take 1 tablet by mouth daily.      OneTouch Delica Lancets 33G MISC      ONETOUCH VERIO test strip ONCE DAILY USE TO TEST BLOOD SUGAR ONCE DAILY E11.22 100 strip 4   pantoprazole (PROTONIX) 40 MG tablet TAKE 1 TABLET BY MOUTH EVERY DAY 90 tablet 1   polyethylene glycol (MIRALAX / GLYCOLAX) packet Take 17 g by mouth daily as needed for moderate constipation.     sitaGLIPtin (JANUVIA) 25 MG tablet Take 1 tablet (25 mg total) by mouth daily. 90 tablet 1   White Petrolatum-Mineral Oil (ARTIFICIAL TEARS) ointment Place 1 drop into both eyes daily as needed (Dry eyes).     No current facility-administered medications for this visit.    Allergies  Allergen Reactions   Metoprolol Other (See Comments)    Bad dreams Patient stated that she had suicidal thoughts while taking this medication.    Diltiazem Other (See Comments)    Patient stated that she had bad dreams with this medication   Levofloxacin Nausea And Vomiting and Nausea Only           Social History   Socioeconomic History   Marital status: Widowed    Spouse name: Not on file   Number of children: 2   Years of education: Not on file   Highest education level: 12th grade  Occupational History   Occupation: retired  Tobacco Use   Smoking status: Former    Current packs/day: 0.00    Average packs/day: 0.5 packs/day for 34.0 years (17.0 ttl pk-yrs)    Types: Cigarettes    Start date: 08/16/1955    Quit date: 08/15/1989    Years since  quitting: 34.4   Smokeless tobacco: Never  Vaping Use   Vaping status: Never Used  Substance and Sexual Activity   Alcohol use: No    Alcohol/week: 0.0 standard drinks  of alcohol   Drug use: No   Sexual activity: Never  Other Topics Concern   Not on file  Social History Narrative   Retired. Widowed. Regularly exercises.    Social Drivers of Corporate investment banker Strain: Low Risk  (10/22/2023)   Overall Financial Resource Strain (CARDIA)    Difficulty of Paying Living Expenses: Not hard at all  Food Insecurity: Patient Declined (10/19/2023)   Hunger Vital Sign    Worried About Running Out of Food in the Last Year: Patient declined    Ran Out of Food in the Last Year: Patient declined  Transportation Needs: No Transportation Needs (10/22/2023)   PRAPARE - Administrator, Civil Service (Medical): No    Lack of Transportation (Non-Medical): No  Physical Activity: Unknown (12/01/2018)   Exercise Vital Sign    Days of Exercise per Week: Patient declined    Minutes of Exercise per Session: Patient declined  Stress: Unknown (12/01/2018)   [employer] of Occupational Health - Occupational Stress Questionnaire    Feeling of Stress : Patient declined  Social Connections: Unknown (12/01/2018)   Social Connection and Isolation Panel [NHANES]    Frequency of Communication with Friends and Family: Patient declined    Frequency of Social Gatherings with Friends and Family: Patient declined    Attends Religious Services: Patient declined    Database administrator or Organizations: Patient declined    Attends Banker Meetings: Patient declined    Marital Status: Patient declined  Intimate Partner Violence: Patient Declined (10/19/2023)   Humiliation, Afraid, Rape, and Kick questionnaire    Fear of Current or Ex-Partner: Patient declined    Emotionally Abused: Patient declined    Physically Abused: Patient declined    Sexually Abused: Patient declined       Family History  Problem Relation Age of Onset   Cancer Mother    Liver cancer Mother    Pancreatic cancer Mother    Heart disease Father    Rheum arthritis Father    Allergies Father    Hypertension Sister    Rheum arthritis Sister    COPD Brother    Breast cancer Maternal Aunt     PHYSICAL EXAM: Vitals:   12/31/23 0938  BP: (!) 111/55  Pulse: 73  SpO2: 99%   GENERAL: Well nourished, well developed, and in no apparent distress at rest.  HEENT: There is no scleral icterus.  The mucous membranes are pink and moist.   CHEST: There are no chest wall deformities. There is no chest wall tenderness. Respirations are unlabored.  Lungs- CTA B/L CARDIAC:  JVP: 8 cm          Normal rate with regular rhythm. No murmur.  Pulses are 2+ and symmetrical in upper and lower extremities. 2+ edema.  ABDOMEN: Soft, non-tender, non-distended. There are normal bowel sounds.  EXTREMITIES: Warm and well perfused.  NEUROLOGIC: Patient is oriented x3 with no obvious focal neurologic deficits.  PSYCH: Patients affect is appropriate SKIN: Warm and dry; no lesions or wounds.   DATA REVIEW  ECG: 11/07/23: RV paced rhythm  As per my personal interpretation  ECHO: 10/20/23: LVEF 55%-60%  as per my personal interpretation  CATH: HEMODYNAMICS: RA:                  6 mmHg (mean) RV:                  45/1-6 mmHg PA:  45/15 mmHg (28 mean) PCWP:            13 mmHg (mean)                                      Estimated Fick CO/CI   6.3 L/min, 2.9 L/min/m2                                              TPG                 15  mmHg                                            PVR                 2.4 Wood Units  PAPi                5     ASSESSMENT & PLAN:  Acute on chronic HFpEF - TTE w/ LVEF 60% on 10/20/23 - Increase lasix to 80mg  daily; instructed on titration of diuretics based on weights. She has 1-2+ pitting edema; this seems to re-occur every few weeks while on lasix 60mg   daily. Repeat BMP in 1 week.  - Unfortunately her severely reduced GFR limits her therapeutic options. Finerenone not indicated for GFR <25 (discontinue if less than 15) -Will consider SGLT2i in the future; she has had trouble with UTIs previously.  -Pulmonary rehab given recent admission -High risk for pacer induced cardiomyopathy given high pacing burden  2. HTN - well controlled today - continue coreg and amlodipine - BMP/BNP today.   3. Atrial fibrillation / flutter - s/p DCCV on 10/17/23 - continue apixaban 2.5mg  daily.  - s/p DCCV in 1/25 - Repeat EKG today.    4. CKD IV - see above -  eGFR less than 15 - repeat BMP today  5. OSA - Wears CPAP nightly.   6. T2DM - Glipizide stopped by PCP given hypoglycemia - Januvia 25mg  daily - A1C on 09/26/23 of 7.2%  I spent 35 minutes caring for this patient today including face to face time, ordering and reviewing labs, reviewing records from Dr. Mariah Milling (12/13/23), counseling on fluid restriction and lasix dosing, seeing the patient, documenting in the record, and arranging follow ups.    Drishti Pepperman Advanced Heart Failure Mechanical Circulatory Support

## 2023-12-31 NOTE — Patient Instructions (Signed)
Medication Changes:  INCREASE LASIX (FUROSEMIDE) TO 80MG  ONCE DAILY   Lab Work:  Go DOWN to LOWER LEVEL (LL) to have your blood work completed inside of [employer] office.  We will only call you if the results are abnormal or if the provider would like to make medication changes.  THEN RETURN IN 10 DAYS FOR REPEAT LABS ---Go over to the MEDICAL MALL. Go pass the gift shop and have your blood work completed.  Follow-Up in: 2 MONTHS AS SCHEDULED WITH DR. Gasper Lloyd   If you have any questions or concerns before your next appointment please send [place] a message through mychart or call our office at [phone] Monday-Friday 8 am-5 pm.   If you have an urgent need after hours on the weekend please call your Primary Cardiologist or the [facility] in [place] at [phone].   At the [facility], you and your health needs are our priority. We have a designated team specialized in the treatment of Heart Failure. This Care Team includes your primary Heart Failure Specialized Cardiologist (physician), Advanced Practice Providers (APPs- Physician Assistants and Nurse Practitioners), and Pharmacist who all work together to provide you with the care you need, when you need it.   You may see any of the following providers on your designated Care Team at your next follow up:  Dr. Arvilla Meres Dr. Marca Ancona Dr. Dorthula Nettles Dr. Theresia Bough Tonye Becket, NP Robbie Lis, [place] [address] [place], [place] Brynda Peon, NP [place] Lee, NP Clarisa Kindred, NP Enos Fling, PharmD

## 2024-01-01 ENCOUNTER — Ambulatory Visit: Payer: Self-pay | Admitting: Internal Medicine

## 2024-01-01 ENCOUNTER — Telehealth: Payer: Self-pay | Admitting: Internal Medicine

## 2024-01-01 LAB — BASIC METABOLIC PANEL
BUN/Creatinine Ratio: 12 (ref 12–28)
BUN: 31 mg/dL — ABNORMAL HIGH (ref 8–27)
CO2: 25 mmol/L (ref 20–29)
Calcium: 9.5 mg/dL (ref 8.7–10.3)
Chloride: 102 mmol/L (ref 96–106)
Creatinine, Ser: 2.62 mg/dL — ABNORMAL HIGH (ref 0.57–1.00)
Glucose: 254 mg/dL — ABNORMAL HIGH (ref 70–99)
Potassium: 3.8 mmol/L (ref 3.5–5.2)
Sodium: 142 mmol/L (ref 134–144)
eGFR: 17 mL/min/{1.73_m2} — ABNORMAL LOW (ref >59)

## 2024-01-01 LAB — BRAIN NATRIURETIC PEPTIDE: BNP: 504.3 pg/mL — ABNORMAL HIGH (ref 0.0–100.0)

## 2024-01-01 NOTE — Telephone Encounter (Signed)
LM to follow up with patient.

## 2024-01-01 NOTE — Telephone Encounter (Signed)
Copied from CRM [id number]. Topic: Medicare AWV >> Jan 01, 2024 11:47 AM Payton Doughty wrote: Reason for CRM: LVM 12/31/22 to r/s AWV 01/02/24 due to [facility] is out of offfice. New AWV date 01/07/24 @340pm , please confirm date change [facility] [place]  [name] [name]; Care Guide Ambulatory Clinical Support [name] [name] [facility] Direct Dial: [phone]

## 2024-01-01 NOTE — Telephone Encounter (Signed)
Chief Complaint: Nose Bleed Symptoms: Nose bleed for about Frequency: 1 episode today Pertinent Negatives: Patient denies headache, dizziness, weakness Disposition: [] ED /[] Urgent Care (no appt availability in office) / [] Appointment(In office/virtual)/ []  [facility]/ [x] Home Care/ [] Refused Recommended Disposition /[] [facility]/ []  Follow-up with PCP Additional Notes: Patient states her nose started bleeding about 25 minutes ago and she can not get it to stop. Patient reports she had [name] nose bleed last week as well. Patient is currently taking the blood thinner Eliquis. Patient reports she did not have [name] problem with nose bleeds before starting the Eliquis about 1 month ago. Care advice was given, after teaching the patient how to apply direct pressure to the nose, patient stated she blew put [name] small clot and the bleeding stopped. Patient declined an appointment and stated she just wanted to make PCP aware that she has been having nose bleeds. Advised patient to callback if nose bleed occurs again to schedule  an appointment. Patient verbalized understanding.  Copied from CRM [id number]. Topic: Clinical - Red Word Triage >> Jan 01, 2024 11:15 AM [name] [name] wrote: [facility] that prompted transfer to Nurse Triage: Nose bleeding won't stop for the past 25 minutes. Reason for Disposition  Taking Coumadin (warfarin) or other strong blood thinner, or known bleeding disorder (e.g., thrombocytopenia)  Answer Assessment - Initial Assessment Questions 1. AMOUNT OF BLEEDING: "How bad is the bleeding?" "How much blood was lost?" "Has the bleeding stopped?"   - MILD: needed [name] couple tissues   - MODERATE: needed many tissues   - SEVERE: large blood clots, soaked many tissues, lasted more than 30 minutes      Moderate, no it has not stopped yet 2. ONSET: "When did the nosebleed start?"      25 minutes  3. FREQUENCY: "How many nosebleeds have you had in the last 24 hours?"      1  nose bleed 4. RECURRENT SYMPTOMS: "Have there been other recent nosebleeds?" If Yes, ask: "How long did it take you to stop the bleeding?" "What worked best?"      Yes, I had [name] nose bleed last week  5. CAUSE: "What do you think caused this nosebleed?"     I'm on Eliquis  6. LOCAL FACTORS: "Do you have any cold symptoms?", "Have you been rubbing or picking at your nose?"     No  7. SYSTEMIC FACTORS: "Do you have high blood pressure or any bleeding problems?"     Yes high blood pressure  8. BLOOD THINNERS: "Do you take any blood thinners?" (e.g., aspirin, clopidogrel / Plavix, coumadin, heparin). Notes: Other strong blood thinners include: Arixtra (fondaparinux), Eliquis (apixaban), Pradaxa (dabigatran), and Xarelto (rivaroxaban).     Eliquis, started about [name] month ago  9. OTHER SYMPTOMS: "Do you have any other symptoms?" (e.g., lightheadedness)     No  Protocols used: Nosebleed-[name]-AH

## 2024-01-02 ENCOUNTER — Ambulatory Visit: Payer: PPO | Admitting: Internal Medicine

## 2024-01-02 DIAGNOSIS — G4733 Obstructive sleep apnea (adult) (pediatric): Secondary | ICD-10-CM | POA: Diagnosis not present

## 2024-01-02 NOTE — Progress Notes (Unsigned)
Cardiology Office Note Date:  01/03/2024  Patient ID:  [name] [name] 1935-06-25, MRN [id number] PCP:  Dale [place], MD  Cardiologist:  [name], MD Electrophysiologist: Lanier Prude, MD  Chief Complaint: Afib follow-up  History of Present Illness: [name] [name] is a 89 y.o. female with PMH notable for tachy-brady syndrome, SND s/p PPM, CKD4, parox afib on amiodarone, HFpEF, pulmHTN, OSA on CPAP; seen today for Lanier Prude, MD for routine follow-up.   Last saw Dr. Lalla Brothers 05/2022 to establish care transferred from [facility] [facility].   She has had progressive SOB dating back to 02/2023. TTE, myoview, and pulm work-up have been unrevealing.  She was recently hospitalized 10/2023 after successful DCCV where she became more SOB afterwards. She was in sinus rhythm at the time of presentation to the ER and converted to AFib/flutter during hospitalization. RHC at that time showed preserved cardiac index with PA mean of .   I saw her in follow-up 12/2023 where she was having improved SOB, continued to be in AFib. She is now s/p DCCV 1/9. She had follow-up with HF earlier this week   On follow-up today, she continues to have improvement in her SOB, continues to walk and work with home PT. She is planning to end [facility] PT at the end of the month and resume going to the [facility] with her sister. Her weight has steadily decreased, down to 230 this AM by home scale.   No cardiac awareness of afib. She continues to take eliquis BID, no missed doses. She is having intermittent nose bleeds.    She denies chest pain, palpitations, dyspnea, PND, orthopnea, nausea, vomiting, dizziness, syncope, edema, weight gain, or early satiety.       Device Information: MDT dual chamber PPM, imp 12/2006; SND Gen change 2014  AAD History: Amiodarone     ROS:  Please see the history of present illness. All other systems are reviewed and otherwise negative.   PHYSICAL EXAM:  VS:  BP 120/60 (BP  Location: Left Arm, Patient Position: Sitting, Cuff Size: Normal)   Pulse 74   Ht 5\' 4"  (1.626 m)   Wt 236 lb 4 oz (107.2 kg)   SpO2 97%   BMI 40.55 kg/m  BMI: Body mass index is 40.55 kg/m.  Wt Readings from Last 3 Encounters:  01/03/24 236 lb 4 oz (107.2 kg)  12/31/23 237 lb 2 oz (107.6 kg)  12/13/23 236 lb 15.9 oz (107.5 kg)    GEN- The patient is well appearing, alert and oriented x 3 today.   Lungs- Clear to ausculation bilaterally, normal work of breathing.  Heart- Regular rate and rhythm, no murmurs, rubs or gallops Extremities- 1+ peripheral edema, warm, dry Skin-   device pocket well-healed, no tethering   Device interrogation done today and reviewed by myself:  Battery 3.5 years Lead thresholds, impedence, sensing stable  Persistent AFib since 1/3 > near 100% mode switch High VP Histograms blunted, but appear to be improving compared to prior  - switched her mode to VVIR    EKG is not ordered. Personal review of EKG from  12/31/2023  shows: Intermittent AS-VP at 64bpm      Recent Labs: 10/22/2023: Magnesium 2.2 11/09/2023: ALT 18; TSH 0.42 12/28/2023: Hemoglobin 10.4; Platelet Count 128 12/31/2023: BNP 504.3; BUN 31; Creatinine, Ser 2.62; Potassium 3.8; Sodium 142  11/09/2023: Cholesterol 150; HDL 49.30; LDL Cholesterol 84; Total CHOL/HDL Ratio 3; Triglycerides 81.0; VLDL 16.2   Estimated Creatinine Clearance: 18.4 mL/min (  A) (by C-G formula based on SCr of 2.62 mg/dL (H)).   Additional studies reviewed include: Previous EP, cardiology notes.   RHC, 10/23/2023 Normal pre and post capillary filling pressures.  Mildly elevated PA mean and PVR likely secondary to HFpEF Normal cardiac output / index.   TTE, 10/20/2023  1. Left ventricular ejection fraction, by estimation, is 55 to 60%. The left ventricle has normal function. The left ventricle has no regional wall motion abnormalities. Left ventricular diastolic parameters are indeterminate.   2. Right  ventricular systolic function is low normal. The right ventricular size is mildly enlarged. There is severely elevated pulmonary artery systolic pressure. The estimated right ventricular systolic pressure is 62.8 mmHg.   3. Left atrial size was mildly dilated.   4. The mitral valve is normal in structure. No evidence of mitral valve regurgitation. No evidence of mitral stenosis.   5. Tricuspid valve regurgitation is moderate.   6. The aortic valve is normal in structure. Aortic valve regurgitation is not visualized. No aortic stenosis is present.   7. The inferior vena cava is normal in size with greater than 50% respiratory variability, suggesting right atrial pressure of 3 mmHg.   NM myocardial w spect, 08/24/2023   The study is normal. The study is low risk.   No ST deviation was noted.   LV perfusion is normal. There is no evidence of ischemia. There is no evidence of infarction.   Left ventricular function is normal. End diastolic cavity size is normal. End systolic cavity size is normal.   CT attenuation images showed mild aortic calcifications.  TTE, 04/25/2023 1. Left ventricular ejection fraction, by estimation, is 55 to 60%. The left ventricle has normal function. The left ventricle has no regional wall motion abnormalities. Left ventricular diastolic parameters were normal.   2. Right ventricular systolic function is normal. The right ventricular size is normal. There is mildly elevated pulmonary artery systolic pressure. The estimated right ventricular systolic pressure is 44.7 mmHg.   3. Left atrial size was moderately dilated.   4. The mitral valve is normal in structure. Moderate mitral valve regurgitation. No evidence of mitral stenosis.   5. Tricuspid valve regurgitation is moderate.   6. The aortic valve is tricuspid. Aortic valve regurgitation is not visualized. No aortic stenosis is present.   7. The inferior vena cava is normal in size with greater than 50% respiratory  variability, suggesting right atrial pressure of 3 mmHg.   ASSESSMENT AND PLAN:  #) SND s/p MDT PPM #) 1st deg AV block #) persis AFib, flutter -- now permanent #) high-risk medication - amiodarone Amiodarone is no longer effective at maintaining sinus rhythm with repeated DCCV Discussed rate control only strategy with patient with permanent AFib Stop amiodarone today Patient is dependent on device, so will not adjust BB Continue 25mg  coreg in AM, 50mg  coreg in PM  #) Hypercoag d/t AFib CHA2DS2-VASc Score = 6 [CHF History: 1, HTN History: 1, Diabetes History: 1, Stroke History: 0, Vascular Disease History: 0, Age Score: 2, Gender Score: 1].  Therefore, the patient's annual risk of stroke is 9.7 % OAC - 2.5mg  eliquis BID Recommended nasal moisturizer and humidifier in the home Recent CBC stable   #) SOB #) HFpEF #) CKD - stage 4 Most recent TTE with perserved LVEF Appears euvolemic, weight continues to decrease, less SOB with improved exercise tolerance CKD limits GDMT options Continue 80mg  lasix daily Continue regularly follow-up with HF team    Current medicines are  reviewed at length with the patient today.   The patient does not have concerns regarding her medicines.  The following changes were made today:   STOP amiodarone   Labs/ tests ordered today include:  No orders of the defined types were placed in this encounter.    Disposition: Follow up with  Dr. Lalla Brothers or EP APP in 12 months Continue q71mon remote monitoring     Signed, Sherie Don, NP  01/03/24  3:01 PM  Electrophysiology [facility]

## 2024-01-02 NOTE — Telephone Encounter (Signed)
FYI- called patient to check on her. She was calling to report that she has been having nose bleeds since starting eliquis about once a week and they last about 15-20 mins. Patient denies any acute symptoms. No active bleeding. She says that she was recently switched to eliquis so wanted to report this and let her doctor know. She is doing okay, feeling okay. No further concerns at this time.

## 2024-01-02 NOTE — Telephone Encounter (Signed)
Given the need to remain on eliquis and given persistent intermittent nosebleeds, I would recommend a referral to ENT for persistent intermittent nosebleeds - for further evaluation. (They may need to cauterize a small blood vessel - to stop bleeding).

## 2024-01-03 ENCOUNTER — Ambulatory Visit: Payer: PPO | Admitting: Cardiology

## 2024-01-03 ENCOUNTER — Ambulatory Visit: Payer: PPO | Attending: Cardiology | Admitting: Cardiology

## 2024-01-03 ENCOUNTER — Encounter: Payer: Self-pay | Admitting: Oncology

## 2024-01-03 VITALS — BP 120/60 | HR 74 | Ht 64.0 in | Wt 236.2 lb

## 2024-01-03 DIAGNOSIS — I480 Paroxysmal atrial fibrillation: Secondary | ICD-10-CM

## 2024-01-03 DIAGNOSIS — Z950 Presence of cardiac pacemaker: Secondary | ICD-10-CM | POA: Diagnosis not present

## 2024-01-03 DIAGNOSIS — I495 Sick sinus syndrome: Secondary | ICD-10-CM | POA: Diagnosis not present

## 2024-01-03 DIAGNOSIS — I4819 Other persistent atrial fibrillation: Secondary | ICD-10-CM | POA: Diagnosis not present

## 2024-01-03 DIAGNOSIS — D6869 Other thrombophilia: Secondary | ICD-10-CM | POA: Diagnosis not present

## 2024-01-03 DIAGNOSIS — Z79899 Other long term (current) drug therapy: Secondary | ICD-10-CM

## 2024-01-03 DIAGNOSIS — I4821 Permanent atrial fibrillation: Secondary | ICD-10-CM

## 2024-01-03 NOTE — Patient Instructions (Signed)
Medication Instructions:  STOP Amiodarone   *If you need a refill on your cardiac medications before your next appointment, please call your pharmacy*   Follow-Up: At [facility], you and your health needs are our priority.  As part of our continuing mission to provide you with exceptional heart care, we have created designated Provider Care Teams.  These Care Teams include your primary Cardiologist (physician) and Advanced Practice Providers (APPs -  Physician Assistants and Nurse Practitioners) who all work together to provide you with the care you need, when you need it.  We recommend signing up for the patient portal called "MyChart".  Sign up information is provided on this After Visit Summary.  MyChart is used to connect with patients for Virtual Visits (Telemedicine).  Patients are able to view lab/test results, encounter notes, upcoming appointments, etc.  Non-urgent messages can be sent to your provider as well.   To learn more about what you can do with MyChart, go to [email or web address].    Your next appointment:   12 month(s)  Provider:   Steffanie Dunn, MD or Sherie Don, NP

## 2024-01-03 NOTE — Telephone Encounter (Signed)
LM for patient

## 2024-01-07 ENCOUNTER — Ambulatory Visit (INDEPENDENT_AMBULATORY_CARE_PROVIDER_SITE_OTHER): Payer: PPO | Admitting: *Deleted

## 2024-01-07 VITALS — Ht 64.0 in | Wt 229.0 lb

## 2024-01-07 DIAGNOSIS — Z0000 Encounter for general adult medical examination without abnormal findings: Secondary | ICD-10-CM | POA: Diagnosis not present

## 2024-01-07 NOTE — Telephone Encounter (Signed)
Patient says she has only had this twice and would like to hold off on referral for now but she would let me know if she continues to have issues then we could place referral.

## 2024-01-07 NOTE — Progress Notes (Signed)
Subjective:   [name] [name] is a 89 y.o. female who presents for Medicare Annual (Subsequent) preventive examination.  Visit Complete: Virtual I connected with  [name] on 01/07/24 by a audio enabled telemedicine application and verified that I am speaking with the correct person using two identifiers. This patient declined Interactive audio and Acupuncturist. Therefore the visit was completed with audio only.   Patient Location: Home  Provider Location: Office/Clinic  I discussed the limitations of evaluation and management by telemedicine. The patient expressed understanding and agreed to proceed.  Vital Signs: Because this visit was a virtual/telehealth visit, some criteria may be missing or patient reported. Any vitals not documented were not able to be obtained and vitals that have been documented are patient reported.  Cardiac Risk Factors include: advanced age (>1men, >66 women);diabetes mellitus;obesity (BMI >30kg/m2);dyslipidemia;hypertension     Objective:    Today's Vitals   01/07/24 1531  Weight: 229 lb (103.9 kg)  Height: 5\' 4"  (1.626 m)   Body mass index is 39.31 kg/m.     01/07/2024    3:44 PM 10/19/2023    7:34 AM 10/19/2023   12:46 AM 06/29/2023   10:31 AM 12/29/2022   10:15 AM 12/20/2021   10:49 AM 04/12/2021    9:28 AM  Advanced Directives  Does Patient Have a Medical Advance Directive? No No No No No No No  Would patient like information on creating a medical advance directive? No - Patient declined No - Patient declined  No - Patient declined  No - Patient declined     Current Medications (verified) Outpatient Encounter Medications as of 01/07/2024  Medication Sig   acetaminophen (TYLENOL) 500 MG tablet Take 1,000 mg by mouth every 6 (six) hours as needed for moderate pain.    allopurinol (ZYLOPRIM) 100 MG tablet Take 100 mg by mouth daily.   amLODipine (NORVASC) 5 MG tablet Take 1 tablet (5 mg total) by mouth daily.   apixaban (ELIQUIS)  2.5 MG TABS tablet Take 1 tablet (2.5 mg total) by mouth 2 (two) times daily.   atorvastatin (LIPITOR) 20 MG tablet TAKE 1 TABLET (20 MG TOTAL) BY MOUTH DAILY.   Blood Glucose Monitoring Suppl (FIFTY50 GLUCOSE METER 2.0) w/Device KIT    calcitRIOL (ROCALTROL) 0.25 MCG capsule TAKE 1 CAPSULE (0.25 MCG TOTAL) BY MOUTH 1 (ONE) TIME EACH DAY   carvedilol (COREG) 25 MG tablet Take 1-2 tablets (25-50 mg total) by mouth as directed. Take 1 tablet (25MG ) by mouth every morning and 2 tablets (50MG ) by mouth every night before bed (Patient taking differently: Take 25-50 mg by mouth See admin instructions. Take 1 tablet (25MG ) by mouth every morning and 2 tablets (50MG ) by mouth every night before bed)   ferrous sulfate 325 (65 FE) MG EC tablet Take 325 mg by mouth daily.    furosemide (LASIX) 20 MG tablet Take 4 tablets (80 mg total) by mouth daily.   Glycerin, Laxative, (FLEET LIQUID GLYCERIN SUPP RE) Place 1 Dose rectally daily as needed (constipation).   KLOR-CON M10 10 MEQ tablet TAKE 1 TABLET BY MOUTH ONCE DAILY   Multiple Vitamins-Minerals (CENTRUM SILVER PO) Take 1 tablet by mouth daily.    OneTouch Delica Lancets 33G MISC    ONETOUCH VERIO test strip ONCE DAILY USE TO TEST BLOOD SUGAR ONCE DAILY E11.22   pantoprazole (PROTONIX) 40 MG tablet TAKE 1 TABLET BY MOUTH EVERY DAY   polyethylene glycol (MIRALAX / GLYCOLAX) packet Take 17 g by mouth daily  as needed for moderate constipation.   sitaGLIPtin (JANUVIA) 25 MG tablet Take 1 tablet (25 mg total) by mouth daily.   White Petrolatum-Mineral Oil (ARTIFICIAL TEARS) ointment Place 1 drop into both eyes daily as needed (Dry eyes).   No facility-administered encounter medications on file as of 01/07/2024.    Allergies (verified) Metoprolol, Diltiazem, and Levofloxacin   History: Past Medical History:  Diagnosis Date   1st degree AV block    a. PR 400 msec with Apacing   A-fib (HCC)    Hx of   Anemia    Anxiety    Arthritis    knees and lower  back    Chest pain    a. 2004 Cath:  reportedly nl;  b. 09/2013 Neg MV; c. 11/2017 nl ETT; d. 08/2023 MV: No isch/infarct. nl LV fxn. Mild Ao Ca2+.   Chronic diastolic heart failure (HCC)    a. 09/2013 Echo: EF 50-55%, mild LVH, mild to mod MR/TR; b. 04/2023 Echo: EF 55-60%, no rwma, nl RV size/fxn, RVSP 44.37mmHg. Mod dil LA. Mod MR/TR.   CKD (chronic kidney disease), stage IV (HCC)    Colon polyps    Congestive heart disease (HCC)    Esophageal reflux    Glaucoma    Gout    H/O hiatal hernia    History of UTI    Hyperlipidemia    Moderate mitral regurgitation    a. 04/2023 Echo: Mod MR.   Morbid obesity (HCC)    OSA (obstructive sleep apnea)    OSA on CPAP    CPAP   Pacemaker    copy of medtronic card in chart   PAF (paroxysmal atrial fibrillation) (HCC)    a. 10/2023 s/p DCCV (150J); b. CHA2DS2VASc = 5-->warfarin.   Primary hypertension    controlled on meds   Pulmonary hypertension (HCC)    a. 04/2023 Echo: RVSP 44.17mmHg.   Seasonal allergies    SSS (sick sinus syndrome) (HCC)    a. 2008 s/p MDT PPM;  b. 11/2013 Gen change- MDT ADDRL1 Adapta DC PPM, ser # [id number].   Symptomatic PVCs    Syncope and collapse    a. Felt to be vasovagal.   Thyroid disease    nodules on thyroid, with goiter, on no meds per pt   Tricuspid regurgitation    Type II diabetes mellitus (HCC)    Type 2   Unspecified glaucoma(365.9)    Vertigo    in past   Wears dentures    upper full and lower partial   Past Surgical History:  Procedure Laterality Date   APPENDECTOMY     CARDIAC CATHETERIZATION     CARDIOVERSION N/A 04/26/2017   Procedure: Cardioversion;  Surgeon: Antonieta Iba, MD;  Location: [facility];  Service: Cardiovascular;  Laterality: N/A;   CARDIOVERSION N/A 10/17/2023   Procedure: CARDIOVERSION;  Surgeon: Antonieta Iba, MD;  Location: [facility];  Service: Cardiovascular;  Laterality: N/A;   CARDIOVERSION N/A 12/13/2023   Procedure: CARDIOVERSION;  Surgeon: Antonieta Iba,  MD;  Location: [facility];  Service: Cardiovascular;  Laterality: N/A;   CATARACT EXTRACTION W/PHACO Left 07/21/2020   Procedure: CATARACT EXTRACTION PHACO AND INTRAOCULAR LENS PLACEMENT (IOC) LEFT DIABETIC;  Surgeon: Lockie Mola, MD;  Location: [facility];  Service: Ophthalmology;  Laterality: Left;  10.25 1:13.2 14.0%   CATARACT EXTRACTION W/PHACO Right 08/11/2020   Procedure: CATARACT EXTRACTION PHACO AND INTRAOCULAR LENS PLACEMENT (IOC) RIGHT DIABETIC;  Surgeon: Lockie Mola, MD;  Location: [facility];  Service: Ophthalmology;  Laterality: Right;  11.63 1:25.0 13.7%   DILATION AND CURETTAGE OF UTERUS     INCISION AND DRAINAGE OF WOUND Right 03/1998   "leg; it was like a boil" (12/01/2013)   INSERT / REPLACE / REMOVE PACEMAKER  2008; 12/01/2013   MDT ADDRL1 pacemaker - gen change by Dr Graciela Husbands 12/01/2013   LUNG BIOPSY  2010   unc   PERMANENT PACEMAKER GENERATOR CHANGE N/A 12/01/2013   Procedure: PERMANENT PACEMAKER GENERATOR CHANGE;  Surgeon: Duke Salvia, MD;  Location: [facility] C[facility];  Service: Cardiovascular;  Laterality: N/A;   RIGHT HEART CATH N/A 10/23/2023   Procedure: RIGHT HEART CATH;  Surgeon: Dorthula Nettles, DO;  Location: [facility] INVASIVE CV LAB;  Service: Cardiovascular;  Laterality: N/A;   VAGINAL HYSTERECTOMY  1970   Family History  Problem Relation Age of Onset   Cancer Mother    Liver cancer Mother    Pancreatic cancer Mother    Heart disease Father    Rheum arthritis Father    Allergies Father    Hypertension Sister    Rheum arthritis Sister    COPD Brother    Breast cancer Maternal Aunt    Social History   Socioeconomic History   Marital status: Widowed    Spouse name: Not on file   Number of children: 2   Years of education: Not on file   Highest education level: 12th grade  Occupational History   Occupation: retired  Tobacco Use   Smoking status: Former    Current packs/day: 0.00    Average packs/day: 0.5 packs/day for  34.0 years (17.0 ttl pk-yrs)    Types: Cigarettes    Start date: 08/16/1955    Quit date: 08/15/1989    Years since quitting: 34.4   Smokeless tobacco: Never  Vaping Use   Vaping status: Never Used  Substance and Sexual Activity   Alcohol use: No    Alcohol/week: 0.0 standard drinks of alcohol   Drug use: No   Sexual activity: Never  Other Topics Concern   Not on file  Social History Narrative   Retired. Widowed. Regularly exercises.    Social Drivers of Corporate investment banker Strain: Low Risk  (01/07/2024)   Overall Financial Resource Strain (CARDIA)    Difficulty of Paying Living Expenses: Not hard at all  Food Insecurity: No Food Insecurity (01/07/2024)   Hunger Vital Sign    Worried About Running Out of Food in the Last Year: Never true    Ran Out of Food in the Last Year: Never true  Transportation Needs: No Transportation Needs (01/07/2024)   PRAPARE - Administrator, Civil Service (Medical): No    Lack of Transportation (Non-Medical): No  Physical Activity: Sufficiently Active (01/07/2024)   Exercise Vital Sign    Days of Exercise per Week: 3 days    Minutes of Exercise per Session: 60 min  Stress: No Stress Concern Present (01/07/2024)   [employer] of Occupational Health - Occupational Stress Questionnaire    Feeling of Stress : Not at all  Social Connections: Moderately Integrated (01/07/2024)   Social Connection and Isolation Panel [NHANES]    Frequency of Communication with Friends and Family: More than three times a week    Frequency of Social Gatherings with Friends and Family: More than three times a week    Attends Religious Services: More than 4 times per year    Active Member of [employer] or Organizations: Yes  Attends Banker Meetings: More than 4 times per year    Marital Status: Widowed    Tobacco Counseling Counseling given: Not Answered   Clinical Intake:  Pre-visit preparation completed: Yes  Pain : No/denies pain      BMI - recorded: 39.31 Nutritional Status: BMI > 30  Obese Nutritional Risks: None Diabetes: Yes CBG done?: Yes (FBS 165) CBG resulted in Enter/ Edit results?: No Did pt. bring in CBG monitor from home?: No  How often do you need to have someone help you when you read instructions, pamphlets, or other written materials from your doctor or pharmacy?: 1 - Never  Interpreter Needed?: No  Information entered by :: R. Iysha Mishkin LPN   Activities of Daily Living    01/07/2024    3:34 PM 12/13/2023    7:37 AM  In your present state of health, do you have any difficulty performing the following activities:  Hearing? 0 0  Vision? 0 0  Comment glasses   Difficulty concentrating or making decisions? 1 0  Walking or climbing stairs? 0   Dressing or bathing? 0   Doing errands, shopping? 0   Preparing Food and eating ? N   Using the Toilet? N   In the past six months, have you accidently leaked urine? N   Do you have problems with loss of bowel control? N   Managing your Medications? N   Managing your Finances? N   Housekeeping or managing your Housekeeping? N     Patient Care Team: Dale , MD as PCP - General (Internal Medicine) Lanier Prude, MD as PCP - Electrophysiology (Cardiology) Antonieta Iba, MD as PCP - Cardiology (Cardiology) Antonieta Iba, MD as Consulting Physician (Cardiology) Mady Haagensen, MD as Consulting Physician (Nephrology) Marinus Maw, MD as Consulting Physician (Cardiology) Irene Limbo., MD (Ophthalmology) Felecia Shelling, DPM as Consulting Physician (Podiatry) Creig Hines, MD as Consulting Physician (Hematology and Oncology) Janann Colonel, MD as Consulting Physician (Pulmonary Disease)  Indicate any recent Medical Services you may have received from other than [facility] providers in the past year (date may be approximate).     Assessment:   This is a routine wellness examination for [facility].  Hearing/Vision screen Hearing  Screening - Comments:: No issues Vision Screening - Comments:: glasses   Goals Addressed             This Visit's Progress    Patient Stated       Wants to continue to exercise        Depression Screen    01/07/2024    3:39 PM 10/11/2023    9:43 AM 03/30/2017   11:35 AM 02/13/2017    2:06 PM 12/19/2016   11:56 AM 12/19/2016   11:48 AM 09/18/2016   11:29 AM  PHQ 2/9 Scores  PHQ - 2 Score 0 3 0 0 2 0 0  PHQ- 9 Score 5 9 4  8       Fall Risk    01/07/2024    3:36 PM 10/11/2023    9:43 AM 02/13/2017    2:06 PM 12/19/2016   11:48 AM 09/18/2016   11:29 AM  Fall Risk   Falls in the past year? 0 0 No No No  Number falls in past yr: 0 0     Injury with Fall? 0 0     Risk for fall due to : No Fall Risks No Fall Risks     Follow up Falls  prevention discussed;Falls evaluation completed Falls evaluation completed       MEDICARE RISK AT HOME: Medicare Risk at Home Any stairs in or around the home?: No If so, are there any without handrails?: No Home free of loose throw rugs in walkways, pet beds, electrical cords, etc?: Yes Adequate lighting in your home to reduce risk of falls?: Yes Life alert?: No Use of a cane, walker or w/c?: Yes Grab bars in the bathroom?: Yes Shower chair or bench in shower?: Yes Elevated toilet seat or a handicapped toilet?: Yes   Cognitive Function:    09/18/2016   11:47 AM  MMSE - Mini Mental State Exam  Orientation to time 5  Orientation to Place 5  Registration 3  Attention/ Calculation 4  Recall 3  Language- name 2 objects 1  Language- repeat 1  Language- follow 3 step command 0  Language- read & follow direction 0  Write a sentence 0  Copy design 0  Total score 22        01/07/2024    3:45 PM  6CIT Screen  What Year? 0 points  What month? 0 points  What time? 0 points  Count back from 20 0 points  Months in reverse 0 points  Repeat phrase 0 points  Total Score 0 points    Immunizations Immunization History  Administered  Date(s) Administered   Fluad Quad(high Dose 65+) 10/24/2022   Fluad Trivalent(High Dose 65+) 10/11/2023   Influenza Inj Mdck Quad Pf 09/06/2018   Influenza, High Dose Seasonal PF 08/10/2015, 09/18/2016, 10/15/2017, 09/19/2019, 09/19/2019   Influenza,inj,Quad PF,6+ Mos 08/10/2015, 09/18/2016   Influenza-Unspecified 09/03/2013, 07/04/2014, 09/18/2016, 09/19/2019, 10/07/2021   PFIZER Comirnaty(Gray Top)Covid-19 Tri-Sucrose Vaccine 10/23/2020   PFIZER(Purple Top)SARS-COV-2 Vaccination 01/16/2020, 02/10/2020   PNEUMOCOCCAL CONJUGATE-20 06/12/2022   Pneumococcal Polysaccharide-23 05/21/2020   Tdap 09/18/2016    TDAP status: Up to date  Flu Vaccine status: Up to date  Pneumococcal vaccine status: Up to date  Covid-19 vaccine status: Information provided on how to obtain vaccines.   Qualifies for Shingles Vaccine? Yes   Zostavax completed No   Shingrix Completed?: No.    Education has been provided regarding the importance of this vaccine. Patient has been advised to call insurance company to determine out of pocket expense if they have not yet received this vaccine. Advised may also receive vaccine at local pharmacy or Health Dept. Verbalized acceptance and understanding.  Screening Tests Health Maintenance  Topic Date Due   DEXA SCAN  Never done   OPHTHALMOLOGY EXAM  07/04/2017   Medicare Annual Wellness (AWV)  06/13/2023   COVID-19 Vaccine (4 - 2024-25 season) 08/05/2023   Zoster Vaccines- Shingrix (1 of 2) 01/30/2024 (Originally 11/23/1954)   HEMOGLOBIN A1C  03/26/2024   FOOT EXAM  11/11/2024   DTaP/Tdap/Td (2 - Td or Tdap) 09/18/2026   Pneumonia Vaccine 83+ Years old  Completed   INFLUENZA VACCINE  Completed   HPV VACCINES  Aged Out    Health Maintenance  Health Maintenance Due  Topic Date Due   DEXA SCAN  Never done   OPHTHALMOLOGY EXAM  07/04/2017   Medicare Annual Wellness (AWV)  06/13/2023   COVID-19 Vaccine (4 - 2024-25 season) 08/05/2023    Colorectal cancer  screening: No longer required.   Mammogram status: Ordered 10/2023. Pt provided with contact info and advised to call to schedule appt. Patient stated that she will call and schedule.  Bone Density status Patient stated that she has had them in the past but  declines at this time  Lung Cancer Screening: (Low Dose CT Chest recommended if Age 68-80 years, 20 pack-year currently smoking OR have quit w/in 15years.) does not qualify.    Additional Screening:  Hepatitis C Screening: does not qualify; Completed NA age  Vision Screening: Recommended annual ophthalmology exams for early detection of glaucoma and other disorders of the eye. Is the patient up to date with their annual eye exam?  Yes  Who is the provider or what is the name of the office in which the patient attends annual eye exams? [facility] If pt is not established with a provider, would they like to be referred to a provider to establish care? No .   Dental Screening: Recommended annual dental exams for proper oral hygiene  Diabetic Foot Exam: Diabetic Foot Exam: Completed 11/2023  Community Resource Referral / Chronic Care Management: CRR required this visit?  No   CCM required this visit?  No     Plan:     I have personally reviewed and noted the following in the patient's chart:   Medical and social history Use of alcohol, tobacco or illicit drugs  Current medications and supplements including opioid prescriptions. Patient is not currently taking opioid prescriptions. Functional ability and status Nutritional status Physical activity Advanced directives List of other physicians Hospitalizations, surgeries, and ER visits in previous 12 months Vitals Screenings to include cognitive, depression, and falls Referrals and appointments  In addition, I have reviewed and discussed with patient certain preventive protocols, quality metrics, and best practice recommendations. A written personalized care plan for  preventive services as well as general preventive health recommendations were provided to patient.     Sydell Axon, LPN   12/09/1094   After Visit Summary: (Pick Up) Due to this being a telephonic visit, with patients personalized plan was offered to patient and patient has requested to Pick up at office.  Nurse Notes: None

## 2024-01-07 NOTE — Patient Instructions (Signed)
[name] [name] , Thank you for taking time to come for your Medicare Wellness Visit. I appreciate your ongoing commitment to your health goals. Please review the following plan we discussed and let me know if I can assist you in the future.   Referrals/Orders/Follow-Ups/Clinician Recommendations: Remember to call and schedule your mammogram. Telephone number given.  This is a list of the screening recommended for you and due dates:  Health Maintenance  Topic Date Due   DEXA scan (bone density measurement)  Never done   Eye exam for diabetics  07/04/2017   COVID-19 Vaccine (4 - 2024-25 season) 08/05/2023   Mammogram  09/29/2023   Zoster (Shingles) Vaccine (1 of 2) 01/30/2024*   Hemoglobin A1C  03/26/2024   Complete foot exam   11/11/2024   Medicare Annual Wellness Visit  01/06/2025   DTaP/Tdap/Td vaccine (2 - Td or Tdap) 09/18/2026   Pneumonia Vaccine  Completed   Flu Shot  Completed   HPV Vaccine  Aged Out  *Topic was postponed. The date shown is not the original due date.    Advanced directives: (Declined) Advance directive discussed with you today. Even though you declined this today, please call our office should you change your mind, and we can give you the proper paperwork for you to fill out.  Next Medicare Annual Wellness Visit scheduled for next year: Yes 01/12/25 @ 3:40

## 2024-01-10 ENCOUNTER — Other Ambulatory Visit
Admission: RE | Admit: 2024-01-10 | Discharge: 2024-01-10 | Disposition: A | Discharge status: Home / Self Care | Payer: PPO | Attending: Cardiology | Admitting: Cardiology

## 2024-01-10 DIAGNOSIS — I5032 Chronic diastolic (congestive) heart failure: Secondary | ICD-10-CM | POA: Insufficient documentation

## 2024-01-10 LAB — BASIC METABOLIC PANEL
Anion gap: 12 (ref 5–15)
BUN: 33 mg/dL — ABNORMAL HIGH (ref 8–23)
CO2: 27 mmol/L (ref 22–32)
Calcium: 9.6 mg/dL (ref 8.9–10.3)
Chloride: 100 mmol/L (ref 98–111)
Creatinine, Ser: 2.72 mg/dL — ABNORMAL HIGH (ref 0.44–1.00)
GFR, Estimated: 17 mL/min — ABNORMAL LOW (ref >60)
Glucose, Bld: 245 mg/dL — ABNORMAL HIGH (ref 70–99)
Potassium: 3.5 mmol/L (ref 3.5–5.1)
Sodium: 139 mmol/L (ref 135–145)

## 2024-01-17 ENCOUNTER — Ambulatory Visit
Admission: RE | Admit: 2024-01-17 | Discharge: 2024-01-17 | Disposition: A | Discharge status: Home / Self Care | Payer: PPO | Source: Ambulatory Visit | Attending: Internal Medicine | Admitting: Internal Medicine

## 2024-01-17 DIAGNOSIS — Z1231 Encounter for screening mammogram for malignant neoplasm of breast: Secondary | ICD-10-CM | POA: Diagnosis not present

## 2024-01-21 ENCOUNTER — Encounter: Payer: Self-pay | Admitting: Internal Medicine

## 2024-01-21 DIAGNOSIS — Z0000 Encounter for general adult medical examination without abnormal findings: Secondary | ICD-10-CM | POA: Insufficient documentation

## 2024-02-04 ENCOUNTER — Telehealth: Payer: Self-pay | Admitting: Internal Medicine

## 2024-02-04 DIAGNOSIS — I5032 Chronic diastolic (congestive) heart failure: Secondary | ICD-10-CM

## 2024-02-04 DIAGNOSIS — E782 Mixed hyperlipidemia: Secondary | ICD-10-CM

## 2024-02-04 DIAGNOSIS — H353221 Exudative age-related macular degeneration, left eye, with active choroidal neovascularization: Secondary | ICD-10-CM | POA: Diagnosis not present

## 2024-02-04 DIAGNOSIS — N184 Chronic kidney disease, stage 4 (severe): Secondary | ICD-10-CM

## 2024-02-04 DIAGNOSIS — E119 Type 2 diabetes mellitus without complications: Secondary | ICD-10-CM

## 2024-02-04 DIAGNOSIS — I4891 Unspecified atrial fibrillation: Secondary | ICD-10-CM

## 2024-02-04 NOTE — Telephone Encounter (Signed)
 Patient need lab orders.

## 2024-02-04 NOTE — Telephone Encounter (Signed)
 Orders placed for labs

## 2024-02-06 ENCOUNTER — Telehealth: Payer: Self-pay | Admitting: *Deleted

## 2024-02-06 ENCOUNTER — Ambulatory Visit: Payer: PPO | Admitting: Cardiology

## 2024-02-06 ENCOUNTER — Other Ambulatory Visit (INDEPENDENT_AMBULATORY_CARE_PROVIDER_SITE_OTHER): Payer: PPO

## 2024-02-06 ENCOUNTER — Other Ambulatory Visit: Payer: PPO

## 2024-02-06 DIAGNOSIS — E119 Type 2 diabetes mellitus without complications: Secondary | ICD-10-CM | POA: Diagnosis not present

## 2024-02-06 DIAGNOSIS — I5032 Chronic diastolic (congestive) heart failure: Secondary | ICD-10-CM | POA: Diagnosis not present

## 2024-02-06 DIAGNOSIS — E782 Mixed hyperlipidemia: Secondary | ICD-10-CM

## 2024-02-06 LAB — LIPID PANEL
Cholesterol: 138 mg/dL (ref 0–200)
HDL: 50.8 mg/dL (ref >39.00)
LDL Cholesterol: 70 mg/dL (ref 0–99)
NonHDL: 86.82
Total CHOL/HDL Ratio: 3
Triglycerides: 83 mg/dL (ref 0.0–149.0)
VLDL: 16.6 mg/dL (ref 0.0–40.0)

## 2024-02-06 LAB — BASIC METABOLIC PANEL
BUN: 34 mg/dL — ABNORMAL HIGH (ref 6–23)
CO2: 30 meq/L (ref 19–32)
Calcium: 9.6 mg/dL (ref 8.4–10.5)
Chloride: 102 meq/L (ref 96–112)
Creatinine, Ser: 2.95 mg/dL — ABNORMAL HIGH (ref 0.40–1.20)
GFR: 13.96 mL/min — CL (ref >60.00)
Glucose, Bld: 211 mg/dL — ABNORMAL HIGH (ref 70–99)
Potassium: 3.9 meq/L (ref 3.5–5.1)
Sodium: 141 meq/L (ref 135–145)

## 2024-02-06 LAB — HEPATIC FUNCTION PANEL
ALT: 22 U/L (ref 0–35)
AST: 20 U/L (ref 0–37)
Albumin: 4 g/dL (ref 3.5–5.2)
Alkaline Phosphatase: 74 U/L (ref 39–117)
Bilirubin, Direct: 0.4 mg/dL — ABNORMAL HIGH (ref 0.0–0.3)
Total Bilirubin: 1.2 mg/dL (ref 0.2–1.2)
Total Protein: 6.4 g/dL (ref 6.0–8.3)

## 2024-02-06 LAB — TSH: TSH: 1.49 u[IU]/mL (ref 0.35–5.50)

## 2024-02-06 LAB — HEMOGLOBIN A1C: Hgb A1c MFr Bld: 8.7 % — ABNORMAL HIGH (ref 4.6–6.5)

## 2024-02-06 NOTE — Telephone Encounter (Signed)
 CRITICAL VALUE STICKER  CRITICAL VALUE: [id number]  RECEIVER (on-site recipient of call): Silvestre Moment, CMA  DATE & TIME NOTIFIED: 02/06/24 @ 3:20pm  MESSENGER (representative from lab): Saa  MD NOTIFIED: Dr Darrick Huntsman in absence of Dr Lorin Picket  TIME OF NOTIFICATION: 3:22pm  RESPONSE:

## 2024-02-06 NOTE — Telephone Encounter (Signed)
 Pt is aware and gave a verbal understanding.

## 2024-02-07 DIAGNOSIS — K219 Gastro-esophageal reflux disease without esophagitis: Secondary | ICD-10-CM | POA: Diagnosis not present

## 2024-02-07 DIAGNOSIS — Z950 Presence of cardiac pacemaker: Secondary | ICD-10-CM | POA: Diagnosis not present

## 2024-02-07 DIAGNOSIS — N184 Chronic kidney disease, stage 4 (severe): Secondary | ICD-10-CM | POA: Diagnosis not present

## 2024-02-07 DIAGNOSIS — I480 Paroxysmal atrial fibrillation: Secondary | ICD-10-CM | POA: Diagnosis not present

## 2024-02-07 DIAGNOSIS — G4733 Obstructive sleep apnea (adult) (pediatric): Secondary | ICD-10-CM | POA: Diagnosis not present

## 2024-02-07 DIAGNOSIS — R600 Localized edema: Secondary | ICD-10-CM | POA: Diagnosis not present

## 2024-02-07 DIAGNOSIS — N2581 Secondary hyperparathyroidism of renal origin: Secondary | ICD-10-CM | POA: Diagnosis not present

## 2024-02-07 DIAGNOSIS — K50919 Crohn's disease, unspecified, with unspecified complications: Secondary | ICD-10-CM | POA: Diagnosis not present

## 2024-02-07 DIAGNOSIS — D631 Anemia in chronic kidney disease: Secondary | ICD-10-CM | POA: Diagnosis not present

## 2024-02-07 DIAGNOSIS — I10 Essential (primary) hypertension: Secondary | ICD-10-CM | POA: Diagnosis not present

## 2024-02-08 ENCOUNTER — Encounter: Payer: Self-pay | Admitting: Internal Medicine

## 2024-02-08 ENCOUNTER — Ambulatory Visit (INDEPENDENT_AMBULATORY_CARE_PROVIDER_SITE_OTHER): Payer: PPO | Admitting: Internal Medicine

## 2024-02-08 VITALS — BP 116/70 | HR 70 | Ht 64.0 in | Wt 239.0 lb

## 2024-02-08 DIAGNOSIS — M19041 Primary osteoarthritis, right hand: Secondary | ICD-10-CM | POA: Diagnosis not present

## 2024-02-08 DIAGNOSIS — N184 Chronic kidney disease, stage 4 (severe): Secondary | ICD-10-CM

## 2024-02-08 DIAGNOSIS — D631 Anemia in chronic kidney disease: Secondary | ICD-10-CM

## 2024-02-08 DIAGNOSIS — E782 Mixed hyperlipidemia: Secondary | ICD-10-CM

## 2024-02-08 DIAGNOSIS — I4892 Unspecified atrial flutter: Secondary | ICD-10-CM | POA: Diagnosis not present

## 2024-02-08 DIAGNOSIS — E042 Nontoxic multinodular goiter: Secondary | ICD-10-CM

## 2024-02-08 DIAGNOSIS — M79641 Pain in right hand: Secondary | ICD-10-CM | POA: Diagnosis not present

## 2024-02-08 DIAGNOSIS — I10 Essential (primary) hypertension: Secondary | ICD-10-CM

## 2024-02-08 DIAGNOSIS — I4891 Unspecified atrial fibrillation: Secondary | ICD-10-CM | POA: Diagnosis not present

## 2024-02-08 DIAGNOSIS — E119 Type 2 diabetes mellitus without complications: Secondary | ICD-10-CM

## 2024-02-08 DIAGNOSIS — I272 Pulmonary hypertension, unspecified: Secondary | ICD-10-CM

## 2024-02-08 DIAGNOSIS — S8012XA Contusion of left lower leg, initial encounter: Secondary | ICD-10-CM | POA: Diagnosis not present

## 2024-02-08 DIAGNOSIS — I5032 Chronic diastolic (congestive) heart failure: Secondary | ICD-10-CM

## 2024-02-08 DIAGNOSIS — G4733 Obstructive sleep apnea (adult) (pediatric): Secondary | ICD-10-CM

## 2024-02-08 DIAGNOSIS — N2581 Secondary hyperparathyroidism of renal origin: Secondary | ICD-10-CM

## 2024-02-08 NOTE — Progress Notes (Signed)
 Subjective:    Patient ID: [name] [name], female    DOB: 09-22-1935, 89 y.o.   MRN: [id number]  Patient here for  Chief Complaint  Patient presents with   Medical Management of Chronic Issues    HPI Here for a scheduled follow up - follow up regarding her diabetes. Was having low sugars, glipizide was stopped. She has had pancreatitis, so avoiding GLP 1 agonist. Given her renal function, unable to take metformin or SGLT2 inhibitors. Has stage IV/V CKD with GFR around 13. Is followed by nephrology. Is s/p DCCV 12/2023. Amiodarone was stopped. Continues on coreg 25mg  in am and 50mg  in pm. Continue eliquis. Continue lasix - per note 80mg  q day. Reports today - breathing overall stable. No chest pain. No abdominal pain or bowel change reported. Did report - was combing her hair and cleaning out the hair from her comb and noticed some increased pain - right hand. Some swelling and pain. Also, reported last Thursday - car door slammed on her left leg. Increased pain and swelling - left lower leg. Is more swollen now. Has been applying cold compresses.    Past Medical History:  Diagnosis Date   1st degree AV block    a. PR 400 msec with Apacing   A-fib (HCC)    Hx of   Anemia    Anxiety    Arthritis    knees and lower back    Chest pain    a. 2004 Cath:  reportedly nl;  b. 09/2013 Neg MV; c. 11/2017 nl ETT; d. 08/2023 MV: No isch/infarct. nl LV fxn. Mild Ao Ca2+.   Chronic diastolic heart failure (HCC)    a. 09/2013 Echo: EF 50-55%, mild LVH, mild to mod MR/TR; b. 04/2023 Echo: EF 55-60%, no rwma, nl RV size/fxn, RVSP 44.34mmHg. Mod dil LA. Mod MR/TR.   CKD (chronic kidney disease), stage IV (HCC)    Colon polyps    Congestive heart disease (HCC)    Esophageal reflux    Glaucoma    Gout    H/O hiatal hernia    History of UTI    Hyperlipidemia    Moderate mitral regurgitation    a. 04/2023 Echo: Mod MR.   Morbid obesity (HCC)    OSA (obstructive sleep apnea)    OSA on CPAP    CPAP    Pacemaker    copy of medtronic card in chart   PAF (paroxysmal atrial fibrillation) (HCC)    a. 10/2023 s/p DCCV (150J); b. CHA2DS2VASc = 5-->warfarin.   Primary hypertension    controlled on meds   Pulmonary hypertension (HCC)    a. 04/2023 Echo: RVSP 44.70mmHg.   Seasonal allergies    SSS (sick sinus syndrome) (HCC)    a. 2008 s/p MDT PPM;  b. 11/2013 Gen change- MDT ADDRL1 Adapta DC PPM, ser # [id number].   Symptomatic PVCs    Syncope and collapse    a. Felt to be vasovagal.   Thyroid disease    nodules on thyroid, with goiter, on no meds per pt   Tricuspid regurgitation    Type II diabetes mellitus (HCC)    Type 2   Unspecified glaucoma(365.9)    Vertigo    in past   Wears dentures    upper full and lower partial   Past Surgical History:  Procedure Laterality Date   APPENDECTOMY     CARDIAC CATHETERIZATION     CARDIOVERSION N/A 04/26/2017   Procedure: Cardioversion;  Surgeon: Mariah Milling,  Tollie Pizza, MD;  Location: [facility];  Service: Cardiovascular;  Laterality: N/A;   CARDIOVERSION N/A 10/17/2023   Procedure: CARDIOVERSION;  Surgeon: Antonieta Iba, MD;  Location: [facility];  Service: Cardiovascular;  Laterality: N/A;   CARDIOVERSION N/A 12/13/2023   Procedure: CARDIOVERSION;  Surgeon: Antonieta Iba, MD;  Location: [facility];  Service: Cardiovascular;  Laterality: N/A;   CATARACT EXTRACTION W/PHACO Left 07/21/2020   Procedure: CATARACT EXTRACTION PHACO AND INTRAOCULAR LENS PLACEMENT (IOC) LEFT DIABETIC;  Surgeon: Lockie Mola, MD;  Location: [facility];  Service: Ophthalmology;  Laterality: Left;  10.25 1:13.2 14.0%   CATARACT EXTRACTION W/PHACO Right 08/11/2020   Procedure: CATARACT EXTRACTION PHACO AND INTRAOCULAR LENS PLACEMENT (IOC) RIGHT DIABETIC;  Surgeon: Lockie Mola, MD;  Location: [facility];  Service: Ophthalmology;  Laterality: Right;  11.63 1:25.0 13.7%   DILATION AND CURETTAGE OF UTERUS     INCISION AND DRAINAGE OF WOUND Right  03/1998   "leg; it was like a boil" (12/01/2013)   INSERT / REPLACE / REMOVE PACEMAKER  2008; 12/01/2013   MDT ADDRL1 pacemaker - gen change by Dr Graciela Husbands 12/01/2013   LUNG BIOPSY  2010   unc   PERMANENT PACEMAKER GENERATOR CHANGE N/A 12/01/2013   Procedure: PERMANENT PACEMAKER GENERATOR CHANGE;  Surgeon: Duke Salvia, MD;  Location: [facility] CATH LAB;  Service: Cardiovascular;  Laterality: N/A;   RIGHT HEART CATH N/A 10/23/2023   Procedure: RIGHT HEART CATH;  Surgeon: Dorthula Nettles, DO;  Location: [facility] INVASIVE CV LAB;  Service: Cardiovascular;  Laterality: N/A;   VAGINAL HYSTERECTOMY  1970   Family History  Problem Relation Age of Onset   Cancer Mother    Liver cancer Mother    Pancreatic cancer Mother    Heart disease Father    Rheum arthritis Father    Allergies Father    Hypertension Sister    Rheum arthritis Sister    COPD Brother    Breast cancer Maternal Aunt    Social History   Socioeconomic History   Marital status: Widowed    Spouse name: Not on file   Number of children: 2   Years of education: Not on file   Highest education level: 12th grade  Occupational History   Occupation: retired  Tobacco Use   Smoking status: Former    Current packs/day: 0.00    Average packs/day: 0.5 packs/day for 34.0 years (17.0 ttl pk-yrs)    Types: Cigarettes    Start date: 08/16/1955    Quit date: 08/15/1989    Years since quitting: 34.5   Smokeless tobacco: Never  Vaping Use   Vaping status: Never Used  Substance and Sexual Activity   Alcohol use: No    Alcohol/week: 0.0 standard drinks of alcohol   Drug use: No   Sexual activity: Never  Other Topics Concern   Not on file  Social History Narrative   Retired. Widowed. Regularly exercises.    Social Drivers of Corporate investment banker Strain: Low Risk  (01/07/2024)   Overall Financial Resource Strain (CARDIA)    Difficulty of Paying Living Expenses: Not hard at all  Food Insecurity: No Food Insecurity (01/07/2024)    Hunger Vital Sign    Worried About Running Out of Food in the Last Year: Never true    Ran Out of Food in the Last Year: Never true  Transportation Needs: No Transportation Needs (01/07/2024)   PRAPARE - Transportation    Lack of Transportation (Medical): No    Lack  of Transportation (Non-Medical): No  Physical Activity: Sufficiently Active (01/07/2024)   Exercise Vital Sign    Days of Exercise per Week: 3 days    Minutes of Exercise per Session: 60 min  Stress: No Stress Concern Present (01/07/2024)   [employer] of Occupational Health - Occupational Stress Questionnaire    Feeling of Stress : Not at all  Social Connections: Moderately Integrated (01/07/2024)   Social Connection and Isolation Panel [NHANES]    Frequency of Communication with Friends and Family: More than three times a week    Frequency of Social Gatherings with Friends and Family: More than three times a week    Attends Religious Services: More than 4 times per year    Active Member of [employer] or Organizations: Yes    Attends Banker Meetings: More than 4 times per year    Marital Status: Widowed     Review of Systems  Constitutional:  Negative for appetite change and unexpected weight change.  HENT:  Negative for congestion and sinus pressure.   Respiratory:  Negative for cough, chest tightness and shortness of breath.   Cardiovascular:  Negative for chest pain and palpitations.       Left leg swelling /contusion as outlined.   Gastrointestinal:  Negative for abdominal pain, diarrhea, nausea and vomiting.  Musculoskeletal:  Negative for myalgias.       Right hand swelling - third finger/hand.   Skin:  Negative for rash.       Contusion - lower left leg.   Neurological:  Negative for dizziness and headaches.  Psychiatric/Behavioral:  Negative for agitation and dysphoric mood.        Objective:     BP 116/70   Pulse 70   Ht 5\' 4"  (1.626 m)   Wt 239 lb (108.4 kg)   SpO2 98%   BMI 41.02 kg/m   Wt Readings from Last 3 Encounters:  02/08/24 239 lb (108.4 kg)  01/07/24 229 lb (103.9 kg)  01/03/24 236 lb 4 oz (107.2 kg)    Physical Exam Vitals reviewed.  Constitutional:      General: She is not in acute distress.    Appearance: Normal appearance.  HENT:     Head: Normocephalic and atraumatic.     Right Ear: External ear normal.     Left Ear: External ear normal.     Mouth/Throat:     Pharynx: No oropharyngeal exudate or posterior oropharyngeal erythema.  Eyes:     General: No scleral icterus.       Right eye: No discharge.        Left eye: No discharge.     Conjunctiva/sclera: Conjunctivae normal.  Neck:     Thyroid: No thyromegaly.  Cardiovascular:     Rate and Rhythm: Normal rate and regular rhythm.  Pulmonary:     Effort: No respiratory distress.     Breath sounds: Normal breath sounds. No wheezing.  Abdominal:     General: Bowel sounds are normal.     Palpations: Abdomen is soft.     Tenderness: There is no abdominal tenderness.  Musculoskeletal:     Cervical back: Neck supple. No tenderness.     Comments: Soft tissue swelling - right hand. Tender to palpation. Left lower leg - contusion and swelling with hematoma. Tender to palpation.   Lymphadenopathy:     Cervical: No cervical adenopathy.  Skin:    Findings: No erythema or rash.  Neurological:     Mental Status: She  is alert.  Psychiatric:        Mood and Affect: Mood normal.        Behavior: Behavior normal.         Outpatient Encounter Medications as of 02/08/2024  Medication Sig   acetaminophen (TYLENOL) 500 MG tablet Take 1,000 mg by mouth every 6 (six) hours as needed for moderate pain.    allopurinol (ZYLOPRIM) 100 MG tablet Take 100 mg by mouth daily.   amLODipine (NORVASC) 5 MG tablet Take 1 tablet (5 mg total) by mouth daily.   apixaban (ELIQUIS) 2.5 MG TABS tablet Take 1 tablet (2.5 mg total) by mouth 2 (two) times daily.   atorvastatin (LIPITOR) 20 MG tablet TAKE 1 TABLET (20 MG TOTAL)  BY MOUTH DAILY.   Blood Glucose Monitoring Suppl (FIFTY50 GLUCOSE METER 2.0) w/Device KIT    calcitRIOL (ROCALTROL) 0.25 MCG capsule TAKE 1 CAPSULE (0.25 MCG TOTAL) BY MOUTH 1 (ONE) TIME EACH DAY   carvedilol (COREG) 25 MG tablet Take 1-2 tablets (25-50 mg total) by mouth as directed. Take 1 tablet (25MG ) by mouth every morning and 2 tablets (50MG ) by mouth every night before bed (Patient taking differently: Take 25-50 mg by mouth See admin instructions. Take 1 tablet (25MG ) by mouth every morning and 2 tablets (50MG ) by mouth every night before bed)   ferrous sulfate 325 (65 FE) MG EC tablet Take 325 mg by mouth daily.    furosemide (LASIX) 20 MG tablet Take 4 tablets (80 mg total) by mouth daily.   Glycerin, Laxative, (FLEET LIQUID GLYCERIN SUPP RE) Place 1 Dose rectally daily as needed (constipation).   KLOR-CON M10 10 MEQ tablet TAKE 1 TABLET BY MOUTH ONCE DAILY   Multiple Vitamins-Minerals (CENTRUM SILVER PO) Take 1 tablet by mouth daily.    OneTouch Delica Lancets 33G MISC    ONETOUCH VERIO test strip ONCE DAILY USE TO TEST BLOOD SUGAR ONCE DAILY E11.22   pantoprazole (PROTONIX) 40 MG tablet TAKE 1 TABLET BY MOUTH EVERY DAY   polyethylene glycol (MIRALAX / GLYCOLAX) packet Take 17 g by mouth daily as needed for moderate constipation.   sitaGLIPtin (JANUVIA) 25 MG tablet Take 1 tablet (25 mg total) by mouth daily.   White Petrolatum-Mineral Oil (ARTIFICIAL TEARS) ointment Place 1 drop into both eyes daily as needed (Dry eyes).   No facility-administered encounter medications on file as of 02/08/2024.     Lab Results  Component Value Date   WBC 5.9 12/28/2023   HGB 10.4 (L) 12/28/2023   HCT 30.8 (L) 12/28/2023   PLT 128 (L) 12/28/2023   GLUCOSE 211 (H) 02/06/2024   CHOL 138 02/06/2024   TRIG 83.0 02/06/2024   HDL 50.80 02/06/2024   LDLCALC 70 02/06/2024   ALT 22 02/06/2024   AST 20 02/06/2024   NA 141 02/06/2024   K 3.9 02/06/2024   CL 102 02/06/2024   CREATININE 2.95 (H)  02/06/2024   BUN 34 (H) 02/06/2024   CO2 30 02/06/2024   TSH 1.49 02/06/2024   INR 1.8 (H) 11/09/2023   HGBA1C 8.7 (H) 02/06/2024    MM 3D SCREENING MAMMOGRAM BILATERAL BREAST Result Date: 01/21/2024 CLINICAL DATA:  Screening. EXAM: DIGITAL SCREENING BILATERAL MAMMOGRAM WITH TOMOSYNTHESIS AND CAD TECHNIQUE: Bilateral screening digital craniocaudal and mediolateral oblique mammograms were obtained. Bilateral screening digital breast tomosynthesis was performed. The images were evaluated with computer-aided detection. COMPARISON:  Previous exam(s). ACR Breast Density Category a: The breasts are almost entirely fatty. FINDINGS: There are no findings suspicious for  malignancy. IMPRESSION: No mammographic evidence of malignancy. A result letter of this screening mammogram will be mailed directly to the patient. RECOMMENDATION: Screening mammogram in one year. (Code:[id number]-B-01Y) BI-RADS CATEGORY  1: Negative. Electronically Signed   By: Harmon Pier M.D.   On: 01/21/2024 16:52       Assessment & Plan:  Anemia due to chronic renal failure treated with erythropoietin, stage 4 (severe) (HCC) Assessment & Plan: Being followed by Dr Smith Robert.  Last labs did not reveal iron deficiency.  Has been taking oral iron. Continue f/u Dr Smith Robert.  Hgb 02/07/24 - 10.    Chronic diastolic congestive heart failure (HCC) -     Basic metabolic panel; Future  Mixed hyperlipidemia Assessment & Plan: Continue lipitor.  Low cholesterol diet and exercise.  Follow lipid panel and liver function tests. No changes today.   Orders: -     Hepatic function panel; Future -     Lipid panel; Future  Controlled type 2 diabetes mellitus without complication, without long-term current use of insulin (HCC) Assessment & Plan: Has been seeing Dr Gershon Crane for her diabetes and f/u multinodular goiter. She was previously having some intermittent low sugars.  Not taking glipizide now. We discussed remaining off this medication, given the low  sugars and her renal function. She did have pancreatitis in the past, so will avoid GLP - 1 agonist. Given her renal function, she is unable to use SGLT-2 and metformin. Last A1c increased 8.7 (up from 7.2). discussed. Has not been watching her diet. Discussed f/u with Dr Gershon Crane. Discussed possible insulin. Check and record sugars and send in readings.   Orders: -     Hemoglobin A1c; Future  Atrial fibrillation and flutter [facility]) Assessment & Plan: Admitted 10/19/23 - 10/23/23 - after presenting with sob and chest pain. Was s/p cardioversion for aflutter 10/17/23. Noticed worsening sob 11/14. Cardiology consulted. Diuresed. Echo EF 55-60, normal LV fxn and no RWMA, indeterminate diastolic, severely elevated pulmonary artery pressure. Lasix held secondary to acute kidney injury. Required oxygen initially. Weaned off. Renal function improved and oral diuretics resumed. Back in aflutter 10/21/23.  Is s/p DCCV 12/2023. Amiodarone was stopped. Continues on coreg 25mg  in am and 50mg  in pm. Continue eliquis.    Chronic diastolic heart failure [facility]) Assessment & Plan: Is s/p DCCV 12/2023. Amiodarone was stopped. Continues on coreg 25mg  in am and 50mg  in pm. Continue eliquis. Continue lasix - per note 80mg  q day. Reports today - breathing overall stable.   CKD (chronic kidney disease) stage 4, GFR 15-29 ml/min (HCC) Assessment & Plan: Followed by Dr Suezanne Jacquet.    Essential hypertension Assessment & Plan: Continues on carvedilol, amlodipine and furosemide.  No changes today.  Follow pressures.     Multinodular goiter Assessment & Plan: Followed by Dr Gershon Crane - thyroid - has been followed with serial ultrasounds.  Previous biopsy ok.    OSA (obstructive sleep apnea) Assessment & Plan: CPAP. F/u with pulmonary.    Pulmonary hypertension, unspecified (HCC) Assessment & Plan: Continue f/u with cardiology. CPAP.    Secondary hyperparathyroidism of renal origin [facility]) Assessment &  Plan: Followed by Dr Suezanne Jacquet.    Severe obesity (BMI >= 40) (HCC) Assessment & Plan: Low carb diet and exercise. Follow.    Contusion of left lower extremity, initial encounter Assessment & Plan: Recent injury as outlined. On eliquis. Increased swelling and pain. Discussed need for ortho evaluation. Will go to [facility] when leaves our clinic today for evaluation.  Hand pain, right Assessment & Plan: Swelling and tenderness as outlined. No known injury. Ortho to evaluate today.       Dale [place], MD

## 2024-02-11 ENCOUNTER — Encounter: Payer: Self-pay | Admitting: Oncology

## 2024-02-11 ENCOUNTER — Encounter: Payer: Self-pay | Admitting: Podiatry

## 2024-02-11 ENCOUNTER — Ambulatory Visit: Payer: PPO | Admitting: Podiatry

## 2024-02-11 DIAGNOSIS — L84 Corns and callosities: Secondary | ICD-10-CM

## 2024-02-11 DIAGNOSIS — E119 Type 2 diabetes mellitus without complications: Secondary | ICD-10-CM

## 2024-02-11 DIAGNOSIS — M79674 Pain in right toe(s): Secondary | ICD-10-CM | POA: Diagnosis not present

## 2024-02-11 DIAGNOSIS — B351 Tinea unguium: Secondary | ICD-10-CM

## 2024-02-11 DIAGNOSIS — M79675 Pain in left toe(s): Secondary | ICD-10-CM | POA: Diagnosis not present

## 2024-02-11 NOTE — Progress Notes (Signed)
 Subjective:  Patient ID: [name] [name], female    DOB: [birth year],  MRN: [id number]  [name] [name] presents to clinic today for preventative diabetic foot care and callus(es) of both feet and painful mycotic toenails that are difficult to trim. Painful toenails interfere with ambulation. Aggravating factors include wearing enclosed shoe gear. Pain is relieved with periodic professional debridement. Painful calluses are aggravated when weightbearing with and without shoegear. Pain is relieved with periodic professional debridement.   Patient states she is recovering from an injury of her LLE. States she hit her left knee with her car door. She saw her PCP and fracture was ruled out. She is wearing a compressive wrap for swelling. States she still has some bruising and the swelling is decreasing.  New problem(s): None.   PCP is Dale Coto Laurel, MD. Theron Arista was 02/08/2024.  Allergies  Allergen Reactions   Metoprolol Other (See Comments)    Bad dreams Patient stated that she had suicidal thoughts while taking this medication.    Diltiazem Other (See Comments)    Patient stated that she had bad dreams with this medication   Levofloxacin Nausea And Vomiting and Nausea Only         Review of Systems: Negative except as noted in the HPI.  Objective: No changes noted in today's physical examination. There were no vitals filed for this visit. [name] [name] is a pleasant 88 y.o. female obese in NAD. AAO x 3.  Vascular Examination: Capillary refill time immediate b/l. Vascular status intact b/l with palpable pedal pulses. Pedal hair present b/l. No pain with calf compression b/l. Skin temperature gradient WNL b/l. No cyanosis or clubbing b/l. No ischemia or gangrene noted b/l. Nonpitting edema noted BLE. Ecchymosis noted medial aspect left ankle which is where she had the injury to her limb.  Neurological Examination: Sensation grossly intact b/l with 10 gram monofilament. Vibratory sensation intact  b/l.   Dermatological Examination: Pedal skin with normal turgor, texture and tone b/l.  No open wounds. No interdigital macerations.   Toenails 1-5 b/l thick, discolored, elongated with subungual debris and pain on dorsal palpation.   Hyperkeratotic lesion(s) submet head 1 left foot, submet head 4 right foot, and submet head 5 left foot.  No erythema, no edema, no drainage, no fluctuance.  Musculoskeletal Examination: Muscle strength 5/5 to all lower extremity muscle groups bilaterally. No pain, crepitus or joint limitation noted with ROM bilateral LE. Pes planus deformity noted bilateral LE. Utilizes cane for ambulation assistance.  Radiographs: None  Last A1c:      Latest Ref Rng & Units 02/06/2024    7:52 AM 09/26/2023   12:00 AM  Hemoglobin A1C  Hemoglobin-A1c 4.6 - 6.5 % 8.7  7.2         This result is from an external source.   Assessment/Plan: 1. Pain due to onychomycosis of toenails of both feet   2. Callus of foot   3. Controlled type 2 diabetes mellitus without complication, without long-term current use of insulin (HCC)     Patient was evaluated and treated. All patient's and/or POA's questions/concerns addressed on today's visit. Toenails 1-5 debrided in length and girth without incident. Callus(es) submet head 1 left foot, submet head 4 right foot, and submet head 5 left foot pared with sharp debridement without incident. Continue daily foot inspections and monitor blood glucose per PCP/Endocrinologist's recommendations. Continue soft, supportive shoe gear daily. Report any pedal injuries to medical professional. Call office if there are any questions/concerns. -Patient/POA  to call should there be question/concern in the interim.   Return in about 3 months (around 05/13/2024).  Freddie Breech, DPM      [facility] LOCATION: [address], [place] [address]                   Office [phone]    [facility] LOCATION: [address] [place], [place] [address] Office [phone]

## 2024-02-13 DIAGNOSIS — N2581 Secondary hyperparathyroidism of renal origin: Secondary | ICD-10-CM | POA: Diagnosis not present

## 2024-02-13 DIAGNOSIS — I129 Hypertensive chronic kidney disease with stage 1 through stage 4 chronic kidney disease, or unspecified chronic kidney disease: Secondary | ICD-10-CM | POA: Diagnosis not present

## 2024-02-13 DIAGNOSIS — D631 Anemia in chronic kidney disease: Secondary | ICD-10-CM | POA: Diagnosis not present

## 2024-02-13 DIAGNOSIS — E1122 Type 2 diabetes mellitus with diabetic chronic kidney disease: Secondary | ICD-10-CM | POA: Diagnosis not present

## 2024-02-13 DIAGNOSIS — N184 Chronic kidney disease, stage 4 (severe): Secondary | ICD-10-CM | POA: Diagnosis not present

## 2024-02-13 DIAGNOSIS — E785 Hyperlipidemia, unspecified: Secondary | ICD-10-CM | POA: Diagnosis not present

## 2024-02-17 ENCOUNTER — Encounter: Payer: Self-pay | Admitting: Internal Medicine

## 2024-02-17 DIAGNOSIS — S8012XA Contusion of left lower leg, initial encounter: Secondary | ICD-10-CM | POA: Insufficient documentation

## 2024-02-17 DIAGNOSIS — M79641 Pain in right hand: Secondary | ICD-10-CM | POA: Insufficient documentation

## 2024-02-17 NOTE — Assessment & Plan Note (Signed)
 Followed by Dr Gershon Crane - thyroid - has been followed with serial ultrasounds.  Previous biopsy ok.

## 2024-02-17 NOTE — Assessment & Plan Note (Signed)
 Followed by Dr Suezanne Jacquet.

## 2024-02-17 NOTE — Assessment & Plan Note (Signed)
Low carb diet and exercise.  Follow.  

## 2024-02-17 NOTE — Assessment & Plan Note (Signed)
 CPAP. F/u with pulmonary.

## 2024-02-17 NOTE — Assessment & Plan Note (Signed)
 Is s/p DCCV 12/2023. Amiodarone was stopped. Continues on coreg 25mg  in am and 50mg  in pm. Continue eliquis. Continue lasix - per note 80mg  q day. Reports today - breathing overall stable.

## 2024-02-17 NOTE — Assessment & Plan Note (Signed)
 Admitted 10/19/23 - 10/23/23 - after presenting with sob and chest pain. Was s/p cardioversion for aflutter 10/17/23. Noticed worsening sob 11/14. Cardiology consulted. Diuresed. Echo EF 55-60, normal LV fxn and no RWMA, indeterminate diastolic, severely elevated pulmonary artery pressure. Lasix held secondary to acute kidney injury. Required oxygen initially. Weaned off. Renal function improved and oral diuretics resumed. Back in aflutter 10/21/23.  Is s/p DCCV 12/2023. Amiodarone was stopped. Continues on coreg 25mg  in am and 50mg  in pm. Continue eliquis.

## 2024-02-17 NOTE — Assessment & Plan Note (Signed)
 Continue lipitor.  Low cholesterol diet and exercise.  Follow lipid panel and liver function tests. No changes today.

## 2024-02-17 NOTE — Assessment & Plan Note (Signed)
 Has been seeing Dr Gershon Crane for her diabetes and f/u multinodular goiter. She was previously having some intermittent low sugars.  Not taking glipizide now. We discussed remaining off this medication, given the low sugars and her renal function. She did have pancreatitis in the past, so will avoid GLP - 1 agonist. Given her renal function, she is unable to use SGLT-2 and metformin. Last A1c increased 8.7 (up from 7.2). discussed. Has not been watching her diet. Discussed f/u with Dr Gershon Crane. Discussed possible insulin. Check and record sugars and send in readings.

## 2024-02-17 NOTE — Assessment & Plan Note (Addendum)
 Continues on carvedilol, amlodipine and furosemide.  No changes today.  Follow pressures.

## 2024-02-17 NOTE — Assessment & Plan Note (Signed)
 Swelling and tenderness as outlined. No known injury. Ortho to evaluate today.

## 2024-02-17 NOTE — Assessment & Plan Note (Signed)
 Continue f/u with cardiology. CPAP.

## 2024-02-17 NOTE — Assessment & Plan Note (Signed)
 Being followed by Dr Smith Robert.  Last labs did not reveal iron deficiency.  Has been taking oral iron. Continue f/u Dr Smith Robert.  Hgb 02/07/24 - 10.

## 2024-02-17 NOTE — Assessment & Plan Note (Signed)
 Recent injury as outlined. On eliquis. Increased swelling and pain. Discussed need for ortho evaluation. Will go to [facility] when leaves our clinic today for evaluation.

## 2024-02-18 ENCOUNTER — Telehealth: Payer: Self-pay

## 2024-02-18 NOTE — Telephone Encounter (Signed)
 Copied from CRM [id number]. Topic: General - Other >> Feb 18, 2024 10:57 AM Elizebeth Brooking wrote: Reason for CRM: Victorino Dike nurse from home health called in stating they had send a plan of care over and they haven't received it signed and faxed back yet .Marland Kitchen She stated to please fax to  [phone]

## 2024-02-18 NOTE — Telephone Encounter (Signed)
 Order faxed.

## 2024-02-26 ENCOUNTER — Other Ambulatory Visit: Payer: Self-pay | Admitting: Internal Medicine

## 2024-02-27 ENCOUNTER — Telehealth: Payer: Self-pay | Admitting: Cardiology

## 2024-02-27 NOTE — Telephone Encounter (Incomplete)
 Called to confirm/remind patient of their [facility]***.   Appointment:   [x] Confirmed  [] Left mess   [] No answer/No voice mail  [] Phone not in service  Patient reminded to bring all medications and/or complete list.  Confirmed patient has transportation. Gave directions, instructed to utilize valet parking.

## 2024-02-28 ENCOUNTER — Ambulatory Visit: Payer: PPO | Attending: Cardiology | Admitting: Cardiology

## 2024-02-28 VITALS — BP 124/72 | HR 71 | Wt 237.0 lb

## 2024-02-28 DIAGNOSIS — Z7984 Long term (current) use of oral hypoglycemic drugs: Secondary | ICD-10-CM | POA: Diagnosis not present

## 2024-02-28 DIAGNOSIS — I130 Hypertensive heart and chronic kidney disease with heart failure and stage 1 through stage 4 chronic kidney disease, or unspecified chronic kidney disease: Secondary | ICD-10-CM | POA: Insufficient documentation

## 2024-02-28 DIAGNOSIS — N184 Chronic kidney disease, stage 4 (severe): Secondary | ICD-10-CM | POA: Diagnosis not present

## 2024-02-28 DIAGNOSIS — R0602 Shortness of breath: Secondary | ICD-10-CM | POA: Diagnosis not present

## 2024-02-28 DIAGNOSIS — G4733 Obstructive sleep apnea (adult) (pediatric): Secondary | ICD-10-CM | POA: Insufficient documentation

## 2024-02-28 DIAGNOSIS — Z7901 Long term (current) use of anticoagulants: Secondary | ICD-10-CM | POA: Diagnosis not present

## 2024-02-28 DIAGNOSIS — I5033 Acute on chronic diastolic (congestive) heart failure: Secondary | ICD-10-CM | POA: Insufficient documentation

## 2024-02-28 DIAGNOSIS — I480 Paroxysmal atrial fibrillation: Secondary | ICD-10-CM | POA: Diagnosis not present

## 2024-02-28 DIAGNOSIS — Z950 Presence of cardiac pacemaker: Secondary | ICD-10-CM | POA: Diagnosis not present

## 2024-02-28 DIAGNOSIS — Z79899 Other long term (current) drug therapy: Secondary | ICD-10-CM | POA: Diagnosis not present

## 2024-02-28 DIAGNOSIS — I495 Sick sinus syndrome: Secondary | ICD-10-CM | POA: Diagnosis not present

## 2024-02-28 DIAGNOSIS — R609 Edema, unspecified: Secondary | ICD-10-CM | POA: Diagnosis not present

## 2024-02-28 DIAGNOSIS — E1122 Type 2 diabetes mellitus with diabetic chronic kidney disease: Secondary | ICD-10-CM | POA: Diagnosis not present

## 2024-02-28 MED ORDER — TORSEMIDE 40 MG PO TABS: 40.0000 mg | ORAL_TABLET | Freq: Every day | ORAL | 5 refills | Status: DC

## 2024-02-28 MED ORDER — TORSEMIDE 40 MG PO TABS
80.0000 mg | ORAL_TABLET | Freq: Every morning | ORAL | 0 refills | Status: DC
Start: 2024-02-28 — End: 2024-03-05

## 2024-02-28 NOTE — Progress Notes (Signed)
 ADVANCED HEART FAILURE CLINIC NOTE  Referring Physician: Dale [place], MD  Primary Care: Dale [place], MD  HPI: [name] [name] is a 89 y.o. female with HFpEF, paroxysmal atrial fibrillation, sick sinus syndrome status post permanent pacemaker, hypertension, OSA, CKD stage IV presenting today for follow up.  She establish care with heart failure in November 2024 when she was admitted with dyspnea on exertion and chest pressure.  At that time patient was found to be in atrial fibrillation with volume overload.  Echocardiogram during admission with EF of 55 to 60% but with severely elevated RVSP.  Patient was aggressively diuresed and underwent right heart catheterization which demonstrated preserved cardiac index and PA mean of 28 mmHg.  Interval hx:  - She underwent cardioversion on 12/13/23.  -Reports that she has been increasingly short of breath over the past several weeks. During this time she has had multiple episodes of orthopnea & PND. - Worsening LE edema over the past 2 weeks; compliant with all medications.   Current Outpatient Medications  Medication Sig Dispense Refill   acetaminophen (TYLENOL) 500 MG tablet Take 1,000 mg by mouth every 6 (six) hours as needed for moderate pain.      allopurinol (ZYLOPRIM) 100 MG tablet Take 100 mg by mouth daily.     amLODipine (NORVASC) 5 MG tablet TAKE 1 TABLET (5 MG TOTAL) BY MOUTH DAILY. 90 tablet 1   apixaban (ELIQUIS) 2.5 MG TABS tablet Take 1 tablet (2.5 mg total) by mouth 2 (two) times daily. 60 tablet 5   atorvastatin (LIPITOR) 20 MG tablet TAKE 1 TABLET (20 MG TOTAL) BY MOUTH DAILY. 90 tablet 3   Blood Glucose Monitoring Suppl (FIFTY50 GLUCOSE METER 2.0) w/Device KIT      calcitRIOL (ROCALTROL) 0.25 MCG capsule TAKE 1 CAPSULE (0.25 MCG TOTAL) BY MOUTH 1 (ONE) TIME EACH DAY     carvedilol (COREG) 25 MG tablet Take 1-2 tablets (25-50 mg total) by mouth as directed. Take 1 tablet (25MG ) by mouth every morning and 2 tablets (50MG ) by  mouth every night before bed (Patient taking differently: Take 25-50 mg by mouth See admin instructions. Take 1 tablet (25MG ) by mouth every morning and 2 tablets (50MG ) by mouth every night before bed) 270 tablet 3   ferrous sulfate 325 (65 FE) MG EC tablet Take 325 mg by mouth daily.      furosemide (LASIX) 20 MG tablet Take 4 tablets (80 mg total) by mouth daily. 120 tablet 3   Glycerin, Laxative, (FLEET LIQUID GLYCERIN SUPP RE) Place 1 Dose rectally daily as needed (constipation).     KLOR-CON M10 10 MEQ tablet TAKE 1 TABLET BY MOUTH ONCE DAILY 90 tablet 1   Multiple Vitamins-Minerals (CENTRUM SILVER PO) Take 1 tablet by mouth daily.      OneTouch Delica Lancets 33G MISC      ONETOUCH VERIO test strip ONCE DAILY USE TO TEST BLOOD SUGAR ONCE DAILY E11.22 100 strip 4   pantoprazole (PROTONIX) 40 MG tablet TAKE 1 TABLET BY MOUTH EVERY DAY 90 tablet 1   polyethylene glycol (MIRALAX / GLYCOLAX) packet Take 17 g by mouth daily as needed for moderate constipation.     sitaGLIPtin (JANUVIA) 25 MG tablet Take 1 tablet (25 mg total) by mouth daily. 90 tablet 1   White Petrolatum-Mineral Oil (ARTIFICIAL TEARS) ointment Place 1 drop into both eyes daily as needed (Dry eyes).     No current facility-administered medications for this visit.   PHYSICAL EXAM: Vitals:  02/28/24 0939 02/28/24 0940  BP: 124/72   Pulse: 83 71  SpO2: (!) 85% 96%   GENERAL: NAD Lungs- inspiratory crackles at bases CARDIAC:  JVP: 10 cm          Normal rate with regular rhythm. 2/6 murmur.  Pulses 2+. 2+ pitting edema.  ABDOMEN: Soft, non-tender, non-distended.  EXTREMITIES: Warm and well perfused.  NEUROLOGIC: No obvious FND   DATA REVIEW  ECG: 11/07/23: RV paced rhythm  As per my personal interpretation  ECHO: 10/20/23: LVEF 55%-60%  as per my personal interpretation  CATH: HEMODYNAMICS: RA:                  6 mmHg (mean) RV:                  45/1-6 mmHg PA:                  45/15 mmHg (28 mean) PCWP:             13 mmHg (mean)                                      Estimated Fick CO/CI   6.3 L/min, 2.9 L/min/m2                                              TPG                 15  mmHg                                            PVR                 2.4 Wood Units  PAPi                5     ASSESSMENT & PLAN:  Acute on chronic HFpEF - TTE w/ LVEF 60% on 10/20/23 - Unfortunately her severely reduced GFR limits her therapeutic options. Finerenone not indicated for GFR <25 (discontinue if less than 15) -Pulmonary rehab pending  - Hypervolemic today with 2+ pitting edema to the knees and abdominal protuberance. Will D/C lasix & start torsemide 80mg  daily x 5 days and then 40mg  daily. Repeat labs today & next week with follow up.  -High risk for pacer induced cardiomyopathy given high pacing burden - Drop in O2 sat today to mid 80s with ambulation. Will monitor. May need PRN ventolin inhaler if she remains dyspneic after diuresis.  2. HTN - started on losartan 25mg  daily by nephrology - BP well controlled today.   3. Atrial fibrillation / flutter - s/p DCCV on 10/17/23 - continue apixaban 2.5mg  daily.  - s/p DCCV in 1/25 - normal pulse rate today - Amiodarone discontinued by EP on 01/03/24, reviewed notes.    4. CKD IV - see above -  eGFR less than 15 - sCr elevated at  3.09 on labs from 02/07/24 - repeat BMP today  5. OSA - Compliant with CPAP.   6. T2DM - Glipizide stopped by PCP given hypoglycemia - Januvia 25mg  daily - A1C on 09/26/23 of 7.2%  I spent 35  minutes caring for this patient today including face to face time, ordering and reviewing labs, reviewing labs noted above, EP records, seeing the patient, documenting in the record, and arranging follow ups.    [name] [name] Advanced Heart Failure Mechanical Circulatory Support

## 2024-02-28 NOTE — Patient Instructions (Signed)
 Medication Changes:  DISCONTINUE LASIX.  START TORSEMIDE 80 MG IN THE MORNING FOR 5 DAYS.  THEN, DECREASE TORSEMIDE TO 40 MG DAILY.   Lab Work:  Go DOWN to LOWER LEVEL (LL) to have your blood work completed inside of [employer] office.  We will only call you if the results are abnormal or if the provider would like to make medication changes.   Follow-Up in: 1 WEEK WITH OUR PHARMACIST NASON WISE, RPH- CPP.  At the [facility], you and your health needs are our priority. We have a designated team specialized in the treatment of Heart Failure. This Care Team includes your primary Heart Failure Specialized Cardiologist (physician), Advanced Practice Providers (APPs- Physician Assistants and Nurse Practitioners), and Pharmacist who all work together to provide you with the care you need, when you need it.   You may see any of the following providers on your designated Care Team at your next follow up:  Dr. Arvilla Meres Dr. Marca Ancona Dr. Dorthula Nettles Dr. Theresia Bough Clarisa Kindred, FNP Enos Fling, RPH-CPP  Please be sure to bring in all your medications bottles to every appointment.   Need to Contact us:  If you have any questions or concerns before your next appointment please send [place] a message through [place] or call our office at [phone].    TO LEAVE A MESSAGE FOR THE NURSE SELECT OPTION 2, PLEASE LEAVE A MESSAGE INCLUDING: YOUR NAME DATE OF BIRTH CALL BACK NUMBER REASON FOR CALL**this is important as we prioritize the call backs  YOU WILL RECEIVE A CALL BACK THE SAME DAY AS LONG AS YOU CALL BEFORE 4:00 PM

## 2024-02-29 LAB — BASIC METABOLIC PANEL WITH GFR
BUN/Creatinine Ratio: 12 (ref 12–28)
BUN: 35 mg/dL — ABNORMAL HIGH (ref 8–27)
CO2: 25 mmol/L (ref 20–29)
Calcium: 9.6 mg/dL (ref 8.7–10.3)
Chloride: 102 mmol/L (ref 96–106)
Creatinine, Ser: 2.95 mg/dL — ABNORMAL HIGH (ref 0.57–1.00)
Glucose: 188 mg/dL — ABNORMAL HIGH (ref 70–99)
Potassium: 3.9 mmol/L (ref 3.5–5.2)
Sodium: 143 mmol/L (ref 134–144)
eGFR: 15 mL/min/{1.73_m2} — ABNORMAL LOW (ref >59)

## 2024-02-29 LAB — BRAIN NATRIURETIC PEPTIDE: BNP: 1035.7 pg/mL — ABNORMAL HIGH (ref 0.0–100.0)

## 2024-03-04 ENCOUNTER — Telehealth: Payer: Self-pay | Admitting: Pharmacist

## 2024-03-04 NOTE — Telephone Encounter (Incomplete)
 Called to confirm/remind patient of their appointment at the [facility] on 03/05/24***.   Appointment:   [] Confirmed  [x] Left mess   [] No answer/No voice mail  [] Phone not in service  Patient reminded to bring all medications and/or complete list.  Confirmed patient has transportation. Gave directions, instructed to utilize valet parking.

## 2024-03-05 ENCOUNTER — Telehealth: Payer: Self-pay

## 2024-03-05 ENCOUNTER — Other Ambulatory Visit (HOSPITAL_COMMUNITY): Payer: Self-pay

## 2024-03-05 ENCOUNTER — Telehealth (HOSPITAL_COMMUNITY): Payer: Self-pay

## 2024-03-05 ENCOUNTER — Other Ambulatory Visit
Admission: RE | Admit: 2024-03-05 | Discharge: 2024-03-05 | Disposition: A | Discharge status: Home / Self Care | Source: Ambulatory Visit | Attending: Cardiology | Admitting: Cardiology

## 2024-03-05 ENCOUNTER — Ambulatory Visit (HOSPITAL_BASED_OUTPATIENT_CLINIC_OR_DEPARTMENT_OTHER): Admitting: Pharmacist

## 2024-03-05 ENCOUNTER — Encounter

## 2024-03-05 VITALS — BP 104/68 | HR 71 | Wt 238.8 lb

## 2024-03-05 DIAGNOSIS — Z950 Presence of cardiac pacemaker: Secondary | ICD-10-CM | POA: Insufficient documentation

## 2024-03-05 DIAGNOSIS — I130 Hypertensive heart and chronic kidney disease with heart failure and stage 1 through stage 4 chronic kidney disease, or unspecified chronic kidney disease: Secondary | ICD-10-CM | POA: Diagnosis not present

## 2024-03-05 DIAGNOSIS — N184 Chronic kidney disease, stage 4 (severe): Secondary | ICD-10-CM | POA: Insufficient documentation

## 2024-03-05 DIAGNOSIS — I5032 Chronic diastolic (congestive) heart failure: Secondary | ICD-10-CM | POA: Diagnosis present

## 2024-03-05 DIAGNOSIS — E1122 Type 2 diabetes mellitus with diabetic chronic kidney disease: Secondary | ICD-10-CM | POA: Diagnosis not present

## 2024-03-05 DIAGNOSIS — I5033 Acute on chronic diastolic (congestive) heart failure: Secondary | ICD-10-CM | POA: Diagnosis not present

## 2024-03-05 DIAGNOSIS — I480 Paroxysmal atrial fibrillation: Secondary | ICD-10-CM | POA: Diagnosis not present

## 2024-03-05 DIAGNOSIS — I4892 Unspecified atrial flutter: Secondary | ICD-10-CM | POA: Insufficient documentation

## 2024-03-05 DIAGNOSIS — Z7984 Long term (current) use of oral hypoglycemic drugs: Secondary | ICD-10-CM | POA: Insufficient documentation

## 2024-03-05 DIAGNOSIS — I495 Sick sinus syndrome: Secondary | ICD-10-CM | POA: Insufficient documentation

## 2024-03-05 DIAGNOSIS — G4733 Obstructive sleep apnea (adult) (pediatric): Secondary | ICD-10-CM | POA: Insufficient documentation

## 2024-03-05 LAB — BASIC METABOLIC PANEL WITH GFR
Anion gap: 11 (ref 5–15)
BUN: 39 mg/dL — ABNORMAL HIGH (ref 8–23)
CO2: 28 mmol/L (ref 22–32)
Calcium: 9.4 mg/dL (ref 8.9–10.3)
Chloride: 103 mmol/L (ref 98–111)
Creatinine, Ser: 3.04 mg/dL — ABNORMAL HIGH (ref 0.44–1.00)
GFR, Estimated: 14 mL/min — ABNORMAL LOW (ref >60)
Glucose, Bld: 158 mg/dL — ABNORMAL HIGH (ref 70–99)
Potassium: 3.8 mmol/L (ref 3.5–5.1)
Sodium: 142 mmol/L (ref 135–145)

## 2024-03-05 LAB — BRAIN NATRIURETIC PEPTIDE: B Natriuretic Peptide: 1504.4 pg/mL — ABNORMAL HIGH (ref 0.0–100.0)

## 2024-03-05 MED ORDER — SEMAGLUTIDE(0.25 OR 0.5MG/DOS) 2 MG/3ML ~~LOC~~ SOPN: 0.2500 mg | PEN_INJECTOR | SUBCUTANEOUS | 1 refills | Status: DC

## 2024-03-05 MED ORDER — TORSEMIDE 20 MG PO TABS: 60.0000 mg | ORAL_TABLET | Freq: Every day | ORAL | 1 refills | Status: DC

## 2024-03-05 NOTE — Telephone Encounter (Signed)
 Pt called to speak to [facility] PharmD. Stated he called her previously but she was not home. States she is now home.

## 2024-03-05 NOTE — Telephone Encounter (Signed)
 Patient Advocate Encounter  Test billing for GLP1s returned the following results:  Requires p auth: Jearld Lesch, Trulicity Not Covered: Arville Lime, Oleh Genin, CPhT Rx Patient Advocate Phone: [phone]

## 2024-03-05 NOTE — Patient Instructions (Signed)
 It was a pleasure seeing you today!  MEDICATIONS: -Start taking torsemide 60 mg daily -Will discuss starting your new injectable medication next week. -Call if you have questions about your medications.  LABS: -We are checking labs today and will call you if medications need to be changed.  NEXT APPOINTMENT: Return to clinic in 1 week with pharmacy.  In general, to take care of your heart failure: -Limit your fluid intake to 2 Liters (half-gallon) per day.   -Limit your salt intake to ideally 2-3 grams (2000-3000 mg) per day. -Weigh yourself daily and record, and bring that "weight diary" to your next appointment.  (Weight gain of 2-3 pounds in 1 day typically means fluid weight.) -The medications for your heart are to help your heart and help you live longer.   -Please contact us before stopping any of your heart medications.  Call the clinic at [phone] with questions or to reschedule future appointments.

## 2024-03-05 NOTE — Progress Notes (Signed)
 Advanced Heart Failure Clinic Note  PCP: Dale Nowata, MD PCP-Cardiologist: [name], MD HF-Cardiologist: Dorthula Nettles, DO  HPI: [name] [name] is a 89 y.o. female with HFpEF, paroxysmal atrial fibrillation, sick sinus syndrome status post permanent pacemaker, hypertension, OSA, CKD stage IV presenting today for follow up.   She establish care with heart failure in November 2024 when she was admitted with dyspnea on exertion and chest pressure.  At that time patient was found to be in atrial fibrillation with volume overload.  Echocardiogram during admission with EF of 55 to 60% but with severely elevated RVSP.  Patient was aggressively diuresed and underwent right heart catheterization which demonstrated preserved cardiac index and PA mean of 28 mmHg. She underwent cardioversion on 12/13/23.   Seen by Dr. Gasper Lloyd on 02/28/24 where she reported worsening shortness of breath and LEE over several weeks. During this time she has had multiple episodes of orthopnea & PND. Furosemide was changed to torsemide 80 mg x 5 days, then 40 mg daily.  Today [name] [name] returns to Heart Failure Clinic for pharmacist medication titration. Reports feeling similar to last week without significant improvement. Reports fatigue and SOB with exertion and persistent LEE. This improved for a few days, but has returned. Denies dizziness, chest pain, palpitations, orthopnea, orthostasis, PND. Reports being able to complete all activities of daily living (ADLs), albeit with frequent rests. Is active throughout the day. Goes to [facility] 3 days per week. Weight at home had been ~231 pounds on torsemide 80 mg daily, now up to 233 lbs this AM. Took torsemide 80 mg daily for 5 days and reports transitioning to 40 mg daily yesterday. Appetite is poor. Does follow a low sodium diet.  Current Heart Failure Medications: Loop diuretic: torsemide 40 mg daily Beta-Blocker: carvedilol 25 mg BID ACEI/ARB/ARNI: losartan 25 mg  daily MRA: none SGLT2i: none Other: none  Has the patient been experiencing any side effects to the medications prescribed? No  Does the patient have any problems obtaining medications due to transportation or finances? No  Understanding of regimen: Excellent  Understanding of indications: Excellent  Potential of adherence: Excellent  Patient understands to avoid NSAIDs.  Patient understands to avoid decongestants.  Pertinent Lab Values: Creat  Date Value Ref Range Status  02/13/2017 1.48 (H) 0.60 - 0.93 mg/dL Final    Comment:      For patients > or = 87 years of age: The upper reference limit for Creatinine is approximately 13% higher for people identified as African-American.      Creatinine, Ser  Date Value Ref Range Status  02/28/2024 2.95 (H) 0.57 - 1.00 mg/dL Final   BUN  Date Value Ref Range Status  02/28/2024 35 (H) 8 - 27 mg/dL Final  16/09/9603 18 7 - 18 mg/dL Final   Potassium  Date Value Ref Range Status  02/28/2024 3.9 3.5 - 5.2 mmol/L Final  08/04/2014 3.9 3.5 - 5.1 mmol/L Final   Sodium  Date Value Ref Range Status  02/28/2024 143 134 - 144 mmol/L Final  08/03/2014 138 136 - 145 mmol/L Final   B Natriuretic Peptide  Date Value Ref Range Status  10/19/2023 577.9 (H) 0.0 - 100.0 pg/mL Final    Comment:    Performed at [facility], [address] [place], [place] [address]   BNP  Date Value Ref Range Status  02/28/2024 1,035.7 (H) 0.0 - 100.0 pg/mL Final    Comment:    [employer] ADVIA [employer] XP methodology  Magnesium  Date Value Ref Range Status  10/22/2023 2.2 1.7 - 2.4 mg/dL Final    Comment:    Performed at [facility], [address] [place], [place] [address]   TSH  Date Value Ref Range Status  02/06/2024 1.49 0.35 - 5.50 uIU/mL Final   DATA REVIEW   ECG: 11/07/23: RV paced rhythm     ECHO: 10/20/23: LVEF 55%-60%      CATH: HEMODYNAMICS: RA:                  6 mmHg (mean) RV:                   45/1-6 mmHg PA:                  45/15 mmHg (28 mean) PCWP:            13 mmHg (mean)                                      Estimated Fick CO/CI   6.3 L/min, 2.9 L/min/m2                                              TPG                 15  mmHg                                            PVR                 2.4 Wood Units  PAPi                5    Vital Signs: Today's Vitals   03/05/24 1058  BP: 104/68  Pulse: 71  SpO2: 95%  Weight: 238 lb 12.8 oz (108.3 kg)    Assessment/Plan: Acute on chronic HFpEF - TTE w/ LVEF 60% on 10/20/23 and low normal RV function. - Unfortunately her severely reduced GFR limits her therapeutic options. Resistant to initiate SGLT2i given eGFR <20. Finerenone not indicated for GFR <25 (discontinue if less than 15). - Will start Ozempic 0.25 mg SQ weekly for HFpEF, DM, and preventing progression of CKD. Most recent A1c up to 8.7. Risk of AKI is only with dehydration. Education provided today. No family history of medullary thyroid cancer. Seeing PCP in 2 days and cal receive follow up education at that time. - Pulmonary rehab pending  - Volume is difficult to assess today. Remains with 2+ pitting edema, dyspnea on exertion, abdominal distention, and fatigue. REDS test today was 34%, suggesting pulmonary edema is not contributing to symptoms. Repeat labs today show creatinine is 3.04, stable from last visit. Previously lost some weight, however weight increased this AM. -Increase torsemide to 60 mg daily. BMET in 1 week.  - High risk for pacer induced cardiomyopathy given high pacing burden   2. HTN - started on losartan 25mg  daily by nephrology - BP well controlled today.    3. Atrial fibrillation / flutter - s/p DCCV on 10/17/23 - continue apixaban 2.5mg  daily.  - s/p DCCV in 1/25 - normal pulse  rate today - Amiodarone discontinued by EP on 01/03/24, reviewed notes.    4. CKD IV - see above -  [id number]. GLP-1RA may help prevent progression. -  sCr  elevated at  2.95 on labs from 02/28/24 -  Repeat BMP today   5. OSA - Compliant with CPAP.  - GLP-1 RA would also help   6. T2DM - Glipizide stopped by PCP given hypoglycemia - Januvia 25mg  daily - A1C on 02/06/24 of 8.7% - Consider starting GLP-1RA  Follow up: nephrology on 03/13/24, CHF clinic in one week.  Please do not hesitate to reach out with questions or concerns,  Enos Fling, PharmD, CPP, BCPS Heart Failure Pharmacist  Phone - [phone] 03/05/2024 12:30 PM

## 2024-03-06 DIAGNOSIS — I129 Hypertensive chronic kidney disease with stage 1 through stage 4 chronic kidney disease, or unspecified chronic kidney disease: Secondary | ICD-10-CM | POA: Diagnosis not present

## 2024-03-06 DIAGNOSIS — E1122 Type 2 diabetes mellitus with diabetic chronic kidney disease: Secondary | ICD-10-CM | POA: Diagnosis not present

## 2024-03-06 DIAGNOSIS — E785 Hyperlipidemia, unspecified: Secondary | ICD-10-CM | POA: Diagnosis not present

## 2024-03-06 DIAGNOSIS — N2581 Secondary hyperparathyroidism of renal origin: Secondary | ICD-10-CM | POA: Diagnosis not present

## 2024-03-06 DIAGNOSIS — D631 Anemia in chronic kidney disease: Secondary | ICD-10-CM | POA: Diagnosis not present

## 2024-03-06 DIAGNOSIS — N184 Chronic kidney disease, stage 4 (severe): Secondary | ICD-10-CM | POA: Diagnosis not present

## 2024-03-07 ENCOUNTER — Ambulatory Visit (INDEPENDENT_AMBULATORY_CARE_PROVIDER_SITE_OTHER): Admitting: Internal Medicine

## 2024-03-07 VITALS — BP 124/70 | HR 86 | Temp 98.0°F | Resp 16 | Ht 64.0 in | Wt 237.2 lb

## 2024-03-07 DIAGNOSIS — I4891 Unspecified atrial fibrillation: Secondary | ICD-10-CM | POA: Diagnosis not present

## 2024-03-07 DIAGNOSIS — E119 Type 2 diabetes mellitus without complications: Secondary | ICD-10-CM | POA: Diagnosis not present

## 2024-03-07 DIAGNOSIS — D582 Other hemoglobinopathies: Secondary | ICD-10-CM

## 2024-03-07 DIAGNOSIS — K219 Gastro-esophageal reflux disease without esophagitis: Secondary | ICD-10-CM

## 2024-03-07 DIAGNOSIS — N184 Chronic kidney disease, stage 4 (severe): Secondary | ICD-10-CM | POA: Diagnosis not present

## 2024-03-07 DIAGNOSIS — I480 Paroxysmal atrial fibrillation: Secondary | ICD-10-CM | POA: Diagnosis not present

## 2024-03-07 DIAGNOSIS — E042 Nontoxic multinodular goiter: Secondary | ICD-10-CM | POA: Diagnosis not present

## 2024-03-07 DIAGNOSIS — G4733 Obstructive sleep apnea (adult) (pediatric): Secondary | ICD-10-CM

## 2024-03-07 DIAGNOSIS — I10 Essential (primary) hypertension: Secondary | ICD-10-CM | POA: Diagnosis not present

## 2024-03-07 DIAGNOSIS — D631 Anemia in chronic kidney disease: Secondary | ICD-10-CM | POA: Diagnosis not present

## 2024-03-07 DIAGNOSIS — I4892 Unspecified atrial flutter: Secondary | ICD-10-CM

## 2024-03-07 DIAGNOSIS — E782 Mixed hyperlipidemia: Secondary | ICD-10-CM

## 2024-03-07 DIAGNOSIS — N2581 Secondary hyperparathyroidism of renal origin: Secondary | ICD-10-CM

## 2024-03-07 DIAGNOSIS — I5032 Chronic diastolic (congestive) heart failure: Secondary | ICD-10-CM

## 2024-03-07 DIAGNOSIS — I272 Pulmonary hypertension, unspecified: Secondary | ICD-10-CM

## 2024-03-07 DIAGNOSIS — R0602 Shortness of breath: Secondary | ICD-10-CM | POA: Diagnosis not present

## 2024-03-07 MED ORDER — MOMETASONE FUROATE 0.1 % EX CREA: TOPICAL_CREAM | CUTANEOUS | 0 refills | Status: AC

## 2024-03-07 MED ORDER — ALLOPURINOL 100 MG PO TABS: 100.0000 mg | ORAL_TABLET | Freq: Every day | ORAL | 1 refills | Status: DC

## 2024-03-07 MED ORDER — PANTOPRAZOLE SODIUM 40 MG PO TBEC: 40.0000 mg | DELAYED_RELEASE_TABLET | Freq: Every day | ORAL | 1 refills | Status: DC

## 2024-03-07 NOTE — Progress Notes (Signed)
 Subjective:    Patient ID: [name] [name], female    DOB: 1935/05/19, 89 y.o.   MRN: [id number]  Patient here for  Chief Complaint  Patient presents with   Medical Management of Chronic Issues    HPI Here for a scheduled follow up - follow up regarding hypercholesterolemia, afib, diabetes and hypertension. Continues on eliquis. Continues f/u with cardiology. Previous Echocardiogram during admission with EF of 55 to 60% but with severely elevated RVSP. Patient was aggressively diuresed and underwent right heart catheterization which demonstrated preserved cardiac index and PA mean of 28 mmHg. S/p cardioversion 12/13/23. Last evaluated 02/28/24 - increased sob and edema. Lasix d/c'd and was started on torsemide 80mg  daily for 5 days and then 40mg  daily. Saw nephrology 02/13/24 - recommended stopping amlodipine and was started on losartan. She reports she is urinating more. Still reports some sob with exertion. Breathing may be some better. Notices sob with increased exertion - making up bed. Has to rest. She is weighing herself. Home weight 03/03/24 - 230 lbs. Also, discussed her last A1c - 8.7. have discussed treatment options. She was previously having some intermittent low sugars.  Not taking glipizide now. We discussed remaining off this medication, given the low sugars and her renal function. She did have pancreatitis in the past, so we had discussed avoiding GLP - 1 agonist. On discussion today, states had recurring problems with pancreatitis in the 80s. Will d/w cardiology. Given her renal function, she is unable to use SGLT-2 and metformin. Also has issues with recurring constipation. Taking miralax to control.    Past Medical History:  Diagnosis Date   1st degree AV block    a. PR 400 msec with Apacing   A-fib (HCC)    Hx of   Anemia    Anxiety    Arthritis    knees and lower back    Chest pain    a. 2004 Cath:  reportedly nl;  b. 09/2013 Neg MV; c. 11/2017 nl ETT; d. 08/2023 MV: No  isch/infarct. nl LV fxn. Mild Ao Ca2+.   Chronic diastolic heart failure (HCC)    a. 09/2013 Echo: EF 50-55%, mild LVH, mild to mod MR/TR; b. 04/2023 Echo: EF 55-60%, no rwma, nl RV size/fxn, RVSP 44.71mmHg. Mod dil LA. Mod MR/TR.   CKD (chronic kidney disease), stage IV (HCC)    Colon polyps    Congestive heart disease (HCC)    Esophageal reflux    Glaucoma    Gout    H/O hiatal hernia    History of UTI    Hyperlipidemia    Moderate mitral regurgitation    a. 04/2023 Echo: Mod MR.   Morbid obesity (HCC)    OSA (obstructive sleep apnea)    OSA on CPAP    CPAP   Pacemaker    copy of medtronic card in chart   PAF (paroxysmal atrial fibrillation) (HCC)    a. 10/2023 s/p DCCV (150J); b. CHA2DS2VASc = 5-->warfarin.   Primary hypertension    controlled on meds   Pulmonary hypertension (HCC)    a. 04/2023 Echo: RVSP 44.66mmHg.   Seasonal allergies    SSS (sick sinus syndrome) (HCC)    a. 2008 s/p MDT PPM;  b. 11/2013 Gen change- MDT ADDRL1 Adapta DC PPM, ser # [id number].   Symptomatic PVCs    Syncope and collapse    a. Felt to be vasovagal.   Thyroid disease    nodules on thyroid, with goiter, on no meds  per pt   Tricuspid regurgitation    Type II diabetes mellitus (HCC)    Type 2   Unspecified glaucoma(365.9)    Vertigo    in past   Wears dentures    upper full and lower partial   Past Surgical History:  Procedure Laterality Date   APPENDECTOMY     CARDIAC CATHETERIZATION     CARDIOVERSION N/A 04/26/2017   Procedure: Cardioversion;  Surgeon: Antonieta Iba, MD;  Location: [facility];  Service: Cardiovascular;  Laterality: N/A;   CARDIOVERSION N/A 10/17/2023   Procedure: CARDIOVERSION;  Surgeon: Antonieta Iba, MD;  Location: [facility];  Service: Cardiovascular;  Laterality: N/A;   CARDIOVERSION N/A 12/13/2023   Procedure: CARDIOVERSION;  Surgeon: Antonieta Iba, MD;  Location: [facility];  Service: Cardiovascular;  Laterality: N/A;   CATARACT EXTRACTION W/PHACO Left  07/21/2020   Procedure: CATARACT EXTRACTION PHACO AND INTRAOCULAR LENS PLACEMENT (IOC) LEFT DIABETIC;  Surgeon: Lockie Mola, MD;  Location: [facility];  Service: Ophthalmology;  Laterality: Left;  10.25 1:13.2 14.0%   CATARACT EXTRACTION W/PHACO Right 08/11/2020   Procedure: CATARACT EXTRACTION PHACO AND INTRAOCULAR LENS PLACEMENT (IOC) RIGHT DIABETIC;  Surgeon: Lockie Mola, MD;  Location: [facility];  Service: Ophthalmology;  Laterality: Right;  11.63 1:25.0 13.7%   DILATION AND CURETTAGE OF UTERUS     INCISION AND DRAINAGE OF WOUND Right 03/1998   "leg; it was like a boil" (12/01/2013)   INSERT / REPLACE / REMOVE PACEMAKER  2008; 12/01/2013   MDT ADDRL1 pacemaker - gen change by Dr Graciela Husbands 12/01/2013   LUNG BIOPSY  2010   unc   PERMANENT PACEMAKER GENERATOR CHANGE N/A 12/01/2013   Procedure: PERMANENT PACEMAKER GENERATOR CHANGE;  Surgeon: Duke Salvia, MD;  Location: [facility];  Service: Cardiovascular;  Laterality: N/A;   RIGHT HEART CATH N/A 10/23/2023   Procedure: RIGHT HEART CATH;  Surgeon: Dorthula Nettles, DO;  Location: [facility] INVASIVE CV LAB;  Service: Cardiovascular;  Laterality: N/A;   VAGINAL HYSTERECTOMY  1970   Family History  Problem Relation Age of Onset   Cancer Mother    Liver cancer Mother    Pancreatic cancer Mother    Heart disease Father    Rheum arthritis Father    Allergies Father    Hypertension Sister    Rheum arthritis Sister    COPD Brother    Breast cancer Maternal Aunt    Social History   Socioeconomic History   Marital status: Widowed    Spouse name: Not on file   Number of children: 2   Years of education: Not on file   Highest education level: 12th grade  Occupational History   Occupation: retired  Tobacco Use   Smoking status: Former    Current packs/day: 0.00    Average packs/day: 0.5 packs/day for 34.0 years (17.0 ttl pk-yrs)    Types: Cigarettes    Start date: 08/16/1955    Quit date: 08/15/1989     Years since quitting: 34.5   Smokeless tobacco: Never  Vaping Use   Vaping status: Never Used  Substance and Sexual Activity   Alcohol use: No    Alcohol/week: 0.0 standard drinks of alcohol   Drug use: No   Sexual activity: Never  Other Topics Concern   Not on file  Social History Narrative   Retired. Widowed. Regularly exercises.    Social Drivers of Health   Financial Resource Strain: Low Risk  (01/07/2024)   Overall Financial Resource Strain (CARDIA)  Difficulty of Paying Living Expenses: Not hard at all  Food Insecurity: No Food Insecurity (01/07/2024)   Hunger Vital Sign    Worried About Running Out of Food in the Last Year: Never true    Ran Out of Food in the Last Year: Never true  Transportation Needs: No Transportation Needs (01/07/2024)   PRAPARE - Administrator, Civil Service (Medical): No    Lack of Transportation (Non-Medical): No  Physical Activity: Sufficiently Active (01/07/2024)   Exercise Vital Sign    Days of Exercise per Week: 3 days    Minutes of Exercise per Session: 60 min  Stress: No Stress Concern Present (01/07/2024)   [employer] of Occupational Health - Occupational Stress Questionnaire    Feeling of Stress : Not at all  Social Connections: Moderately Integrated (01/07/2024)   Social Connection and Isolation Panel [NHANES]    Frequency of Communication with Friends and Family: More than three times a week    Frequency of Social Gatherings with Friends and Family: More than three times a week    Attends Religious Services: More than 4 times per year    Active Member of [employer] or Organizations: Yes    Attends Banker Meetings: More than 4 times per year    Marital Status: Widowed     Review of Systems  Constitutional:  Negative for appetite change and unexpected weight change.  HENT:  Negative for congestion and sinus pressure.   Respiratory:  Negative for cough and chest tightness.        SOB with exertion. Unchanged,  maybe a little better.   Cardiovascular:  Negative for chest pain and palpitations.       Leg swelling has improved some.   Gastrointestinal:  Positive for constipation. Negative for abdominal pain, diarrhea, nausea and vomiting.  Genitourinary:  Negative for difficulty urinating and dysuria.  Musculoskeletal:  Negative for joint swelling and myalgias.  Skin:  Negative for color change and rash.  Neurological:  Negative for dizziness and headaches.  Psychiatric/Behavioral:  Negative for agitation and dysphoric mood.        Objective:     BP 124/70   Pulse 86   Temp 98 F (36.7 C)   Resp 16   Ht 5\' 4"  (1.626 m)   Wt 237 lb 3.2 oz (107.6 kg)   SpO2 99%   BMI 40.72 kg/m  Wt Readings from Last 3 Encounters:  03/07/24 237 lb 3.2 oz (107.6 kg)  03/05/24 238 lb 12.8 oz (108.3 kg)  02/28/24 237 lb (107.5 kg)    Physical Exam Vitals reviewed.  Constitutional:      General: She is not in acute distress.    Appearance: Normal appearance.  HENT:     Head: Normocephalic and atraumatic.     Right Ear: External ear normal.     Left Ear: External ear normal.     Mouth/Throat:     Pharynx: No oropharyngeal exudate or posterior oropharyngeal erythema.  Eyes:     General: No scleral icterus.       Right eye: No discharge.        Left eye: No discharge.     Conjunctiva/sclera: Conjunctivae normal.  Neck:     Thyroid: No thyromegaly.  Cardiovascular:     Rate and Rhythm: Normal rate and regular rhythm.  Pulmonary:     Effort: No respiratory distress.     Breath sounds: Normal breath sounds. No wheezing.  Abdominal:  General: Bowel sounds are normal.     Palpations: Abdomen is soft.     Tenderness: There is no abdominal tenderness.  Musculoskeletal:        General: No tenderness.     Cervical back: Neck supple. No tenderness.     Comments: No increased swelling - improved.   Lymphadenopathy:     Cervical: No cervical adenopathy.  Skin:    Findings: No erythema or rash.   Neurological:     Mental Status: She is alert.  Psychiatric:        Mood and Affect: Mood normal.        Behavior: Behavior normal.         Outpatient Encounter Medications as of 03/07/2024  Medication Sig   mometasone (ELOCON) 0.1 % cream Apply to affected area daily prn.   acetaminophen (TYLENOL) 500 MG tablet Take 1,000 mg by mouth every 6 (six) hours as needed for moderate pain.    allopurinol (ZYLOPRIM) 100 MG tablet Take 1 tablet (100 mg total) by mouth daily.   apixaban (ELIQUIS) 2.5 MG TABS tablet Take 1 tablet (2.5 mg total) by mouth 2 (two) times daily.   atorvastatin (LIPITOR) 20 MG tablet TAKE 1 TABLET (20 MG TOTAL) BY MOUTH DAILY.   Blood Glucose Monitoring Suppl (FIFTY50 GLUCOSE METER 2.0) w/Device KIT    calcitRIOL (ROCALTROL) 0.25 MCG capsule TAKE 1 CAPSULE (0.25 MCG TOTAL) BY MOUTH 1 (ONE) TIME EACH DAY   carvedilol (COREG) 25 MG tablet Take 1-2 tablets (25-50 mg total) by mouth as directed. Take 1 tablet (25MG ) by mouth every morning and 2 tablets (50MG ) by mouth every night before bed (Patient taking differently: Take 25-50 mg by mouth See admin instructions. Take 1 tablet (25MG ) by mouth every morning and 2 tablets (50MG ) by mouth every night before bed)   ferrous sulfate 325 (65 FE) MG EC tablet Take 325 mg by mouth daily.    Glycerin, Laxative, (FLEET LIQUID GLYCERIN SUPP RE) Place 1 Dose rectally daily as needed (constipation).   KLOR-CON M10 10 MEQ tablet TAKE 1 TABLET BY MOUTH ONCE DAILY (Patient not taking: Reported on 03/05/2024)   Multiple Vitamins-Minerals (CENTRUM SILVER PO) Take 1 tablet by mouth daily.    OneTouch Delica Lancets 33G MISC    ONETOUCH VERIO test strip ONCE DAILY USE TO TEST BLOOD SUGAR ONCE DAILY E11.22   pantoprazole (PROTONIX) 40 MG tablet Take 1 tablet (40 mg total) by mouth daily.   polyethylene glycol (MIRALAX / GLYCOLAX) packet Take 17 g by mouth daily as needed for moderate constipation.   Semaglutide,0.25 or 0.5MG /DOS, 2 MG/3ML SOPN  Inject 0.25 mg into the skin once a week.   sitaGLIPtin (JANUVIA) 25 MG tablet Take 1 tablet (25 mg total) by mouth daily.   torsemide (DEMADEX) 20 MG tablet Take 3 tablets (60 mg total) by mouth daily.   White Petrolatum-Mineral Oil (ARTIFICIAL TEARS) ointment Place 1 drop into both eyes daily as needed (Dry eyes).   [DISCONTINUED] allopurinol (ZYLOPRIM) 100 MG tablet Take 100 mg by mouth daily.   [DISCONTINUED] pantoprazole (PROTONIX) 40 MG tablet TAKE 1 TABLET BY MOUTH EVERY DAY   No facility-administered encounter medications on file as of 03/07/2024.     Lab Results  Component Value Date   WBC 5.9 12/28/2023   HGB 10.4 (L) 12/28/2023   HCT 30.8 (L) 12/28/2023   PLT 128 (L) 12/28/2023   GLUCOSE 158 (H) 03/05/2024   CHOL 138 02/06/2024   TRIG 83.0 02/06/2024  HDL 50.80 02/06/2024   LDLCALC 70 02/06/2024   ALT 22 02/06/2024   AST 20 02/06/2024   NA 142 03/05/2024   K 3.8 03/05/2024   CL 103 03/05/2024   CREATININE 3.04 (H) 03/05/2024   BUN 39 (H) 03/05/2024   CO2 28 03/05/2024   TSH 1.49 02/06/2024   INR 1.8 (H) 11/09/2023   HGBA1C 8.7 (H) 02/06/2024       Assessment & Plan:  Controlled type 2 diabetes mellitus without complication, without long-term current use of insulin (HCC) Assessment & Plan: Has been seeing Dr Gershon Crane for her diabetes and f/u multinodular goiter. She was previously having some intermittent low sugars.  Not taking glipizide now. We discussed remaining off this medication, given the low sugars and her renal function. She did have pancreatitis in the past, so we had discussed avoiding GLP - 1 agonist. On discussion today, states had recurring problems with pancreatitis in the 80s. Will d/w cardiology. Given her renal function, she is unable to use SGLT-2 and metformin. Last A1c increased 8.7 (up from 7.2). discussed. Has not been watching her diet. Discussed f/u with Dr Gershon Crane. Discussed insulin. She wants to hold on insulin. Check and record sugars and  send in readings. States she has started watching her diet better. Outside sugars reviewed. Most recent readings 113-150 in the am and 150-180s in the pm. Continue low carb diet and exercise.    Gastro-esophageal reflux disease without esophagitis -     Pantoprazole Sodium; Take 1 tablet (40 mg total) by mouth daily.  Dispense: 90 tablet; Refill: 1  Anemia due to chronic renal failure treated with erythropoietin, stage 4 (severe) (HCC) Assessment & Plan: Being followed by Dr Smith Robert.  Last labs did not reveal iron deficiency.  Has been taking oral iron. Continue f/u Dr Smith Robert.  Hgb 02/07/24 - 10. Follow.    Atrial fibrillation and flutter [facility]) Assessment & Plan: Admitted 10/19/23 - 10/23/23 - after presenting with sob and chest pain. Was s/p cardioversion for aflutter 10/17/23. Noticed worsening sob 11/14. Cardiology consulted. Diuresed. Echo EF 55-60, normal LV fxn and no RWMA, indeterminate diastolic, severely elevated pulmonary artery pressure. Lasix held secondary to acute kidney injury. Required oxygen initially. Weaned off. Renal function improved and oral diuretics resumed. Back in aflutter 10/21/23.  Is s/p DCCV 12/2023. Amiodarone was stopped. Continues on coreg 25mg  in am and 50mg  in pm. Continue eliquis. Continue f/u with cardiology.    Chronic diastolic heart failure [facility]) Assessment & Plan: Is s/p DCCV 12/2023. Amiodarone was stopped. Continues on coreg 25mg  in am and 50mg  in pm. Continue eliquis. Continues f/u with cardiology. Previous Echocardiogram during admission with EF of 55 to 60% but with severely elevated RVSP. Patient was aggressively diuresed and underwent right heart catheterization which demonstrated preserved cardiac index and PA mean of 28 mmHg. S/p cardioversion 12/13/23. Last evaluated 02/28/24 - increased sob and edema. Lasix d/c'd and was started on torsemide 80mg  daily for 5 days and then 40mg  daily. Nephrology added losartan.    CKD (chronic kidney disease) stage 4, GFR  15-29 ml/min (HCC) Assessment & Plan: Followed by Dr Suezanne Jacquet. Recently added losartan.    Essential hypertension Assessment & Plan: Continues on carvedilol. Now on losartan and torsemide.   No changes today.  Follow pressures.     Hemoglobin C disease (HCC) Assessment & Plan: Followed by hematology.    Mixed hyperlipidemia Assessment & Plan: Continue lipitor.  Low cholesterol diet and exercise.  Follow lipid panel and liver function  tests. No changes today.    Multinodular goiter Assessment & Plan: Followed by Dr Gershon Crane - thyroid - has been followed with serial ultrasounds.  Previous biopsy ok. Follow thyroid function.    OSA (obstructive sleep apnea) Assessment & Plan: CPAP.    PAF (paroxysmal atrial fibrillation) (HCC) Assessment & Plan: Continues on amiodarone. On eliquis.  Heart rate controlled.    Pulmonary hypertension, unspecified (HCC) Assessment & Plan: Continue f/u with cardiology. Continue CPAP.    Secondary hyperparathyroidism of renal origin [facility]) Assessment & Plan: Followed by Dr Suezanne Jacquet.    SOB (shortness of breath) Assessment & Plan: Recently evaluated by cardiology. Lasix changed to torsemide as outlined. Continues on torsemide. Has f/u with cardiology next week. Stable.    Other orders -     Allopurinol; Take 1 tablet (100 mg total) by mouth daily.  Dispense: 90 tablet; Refill: 1 -     Mometasone Furoate; Apply to affected area daily prn.  Dispense: 30 g; Refill: 0     Dale Fleming-Neon, MD

## 2024-03-10 ENCOUNTER — Encounter: Payer: Self-pay | Admitting: Internal Medicine

## 2024-03-10 NOTE — Assessment & Plan Note (Signed)
 Continue f/u with cardiology. Continue CPAP.

## 2024-03-10 NOTE — Assessment & Plan Note (Signed)
 CPAP.

## 2024-03-10 NOTE — Assessment & Plan Note (Signed)
 Is s/p DCCV 12/2023. Amiodarone was stopped. Continues on coreg 25mg  in am and 50mg  in pm. Continue eliquis. Continues f/u with cardiology. Previous Echocardiogram during admission with EF of 55 to 60% but with severely elevated RVSP. Patient was aggressively diuresed and underwent right heart catheterization which demonstrated preserved cardiac index and PA mean of 28 mmHg. S/p cardioversion 12/13/23. Last evaluated 02/28/24 - increased sob and edema. Lasix d/c'd and was started on torsemide 80mg  daily for 5 days and then 40mg  daily. Nephrology added losartan.

## 2024-03-10 NOTE — Assessment & Plan Note (Signed)
 Followed by Dr Gershon Crane - thyroid - has been followed with serial ultrasounds.  Previous biopsy ok. Follow thyroid function.

## 2024-03-10 NOTE — Assessment & Plan Note (Signed)
 Being followed by Dr Smith Robert.  Last labs did not reveal iron deficiency.  Has been taking oral iron. Continue f/u Dr Smith Robert.  Hgb 02/07/24 - 10. Follow.

## 2024-03-10 NOTE — Assessment & Plan Note (Signed)
 Has been seeing Dr Gershon Crane for her diabetes and f/u multinodular goiter. She was previously having some intermittent low sugars.  Not taking glipizide now. We discussed remaining off this medication, given the low sugars and her renal function. She did have pancreatitis in the past, so we had discussed avoiding GLP - 1 agonist. On discussion today, states had recurring problems with pancreatitis in the 80s. Will d/w cardiology. Given her renal function, she is unable to use SGLT-2 and metformin. Last A1c increased 8.7 (up from 7.2). discussed. Has not been watching her diet. Discussed f/u with Dr Gershon Crane. Discussed insulin. She wants to hold on insulin. Check and record sugars and send in readings. States she has started watching her diet better. Outside sugars reviewed. Most recent readings 113-150 in the am and 150-180s in the pm. Continue low carb diet and exercise.

## 2024-03-10 NOTE — Assessment & Plan Note (Signed)
 Followed by Dr Suezanne Jacquet.

## 2024-03-10 NOTE — Assessment & Plan Note (Signed)
 Followed by Dr Suezanne Jacquet. Recently added losartan.

## 2024-03-10 NOTE — Assessment & Plan Note (Signed)
 Continue lipitor.  Low cholesterol diet and exercise.  Follow lipid panel and liver function tests. No changes today.

## 2024-03-10 NOTE — Assessment & Plan Note (Signed)
 Continues on carvedilol. Now on losartan and torsemide.   No changes today.  Follow pressures.

## 2024-03-10 NOTE — Assessment & Plan Note (Signed)
 Continues on amiodarone. On eliquis.  Heart rate controlled.

## 2024-03-10 NOTE — Progress Notes (Unsigned)
 Advanced Heart Failure Clinic Note  PCP: Dale , MD PCP-Cardiologist: [name], MD HF-Cardiologist: Dorthula Nettles, DO  HPI: [name] [name] is a 89 y.o. female with HFpEF, paroxysmal atrial fibrillation, sick sinus syndrome status post permanent pacemaker, hypertension, OSA, CKD stage IV presenting today for follow up.   She establish care with heart failure in November 2024 when she was admitted with dyspnea on exertion and chest pressure.  At that time patient was found to be in atrial fibrillation with volume overload.  Echocardiogram during admission with EF of 55 to 60% but with severely elevated RVSP.  Patient was aggressively diuresed and underwent right heart catheterization which demonstrated preserved cardiac index and PA mean of 28 mmHg. She underwent cardioversion on 12/13/23.   Seen by Dr. Gasper Lloyd on 02/28/24 where she reported worsening shortness of breath and LEE over several weeks. During this time she has had multiple episodes of orthopnea & PND. Furosemide was changed to torsemide 80 mg x 5 days, then 40 mg daily.  Today [name] returns to Heart Failure Clinic for pharmacist medication titration. Reports feeling similar to last week without significant improvement. Reports fatigue and SOB with exertion and persistent LEE. This improved for a few days, but has returned. Denies dizziness, chest pain, palpitations, orthopnea, orthostasis, PND. Reports being able to complete all activities of daily living (ADLs), albeit with frequent rests. Is active throughout the day. Goes to [facility] 3 days per week. Weight at home had been ~231 pounds on torsemide 80 mg daily, now up to 233 lbs this AM. Took torsemide 80 mg daily for 5 days and reports transitioning to 40 mg daily yesterday. Appetite is poor. Does follow a low sodium diet.  Current Heart Failure Medications: Loop diuretic: torsemide 40 mg daily Beta-Blocker: carvedilol 25 mg BID ACEI/ARB/ARNI: losartan 25 mg  daily MRA: none SGLT2i: none Other: none  Has the patient been experiencing any side effects to the medications prescribed? No  Does the patient have any problems obtaining medications due to transportation or finances? No  Understanding of regimen: Excellent  Understanding of indications: Excellent  Potential of adherence: Excellent  Patient understands to avoid NSAIDs.  Patient understands to avoid decongestants.  Pertinent Lab Values: Creat  Date Value Ref Range Status  02/13/2017 1.48 (H) 0.60 - 0.93 mg/dL Final    Comment:      For patients > or = 87 years of age: The upper reference limit for Creatinine is approximately 13% higher for people identified as African-American.      Creatinine, Ser  Date Value Ref Range Status  03/05/2024 3.04 (H) 0.44 - 1.00 mg/dL Final   BUN  Date Value Ref Range Status  03/05/2024 39 (H) 8 - 23 mg/dL Final  16/09/9603 35 (H) 8 - 27 mg/dL Final  54/08/8118 18 7 - 18 mg/dL Final   Potassium  Date Value Ref Range Status  03/05/2024 3.8 3.5 - 5.1 mmol/L Final  08/04/2014 3.9 3.5 - 5.1 mmol/L Final   Sodium  Date Value Ref Range Status  03/05/2024 142 135 - 145 mmol/L Final  02/28/2024 143 134 - 144 mmol/L Final  08/03/2014 138 136 - 145 mmol/L Final   B Natriuretic Peptide  Date Value Ref Range Status  03/05/2024 1,504.4 (H) 0.0 - 100.0 pg/mL Final    Comment:    Performed at [facility], [address], [place], [place] [address]   Magnesium  Date Value Ref Range Status  10/22/2023 2.2 1.7 - 2.4  mg/dL Final    Comment:    Performed at [facility], [address] [place], [place] [address]   TSH  Date Value Ref Range Status  02/06/2024 1.49 0.35 - 5.50 uIU/mL Final   DATA REVIEW   ECG: 11/07/23: RV paced rhythm     ECHO: 10/20/23: LVEF 55%-60%      CATH: HEMODYNAMICS: RA:                  6 mmHg (mean) RV:                  45/1-6 mmHg PA:                  45/15 mmHg (28 mean) PCWP:             13 mmHg (mean)                                      Estimated Fick CO/CI   6.3 L/min, 2.9 L/min/m2                                              TPG                 15  mmHg                                            PVR                 2.4 Wood Units  PAPi                5    Vital Signs: There were no vitals filed for this visit.   Assessment/Plan: Acute on chronic HFpEF - TTE w/ LVEF 60% on 10/20/23 and low normal RV function. - Unfortunately her severely reduced GFR limits her therapeutic options. Resistant to initiate SGLT2i given eGFR <20. Finerenone not indicated for GFR <25 (discontinue if less than 15). - Will start Ozempic 0.25 mg SQ weekly for HFpEF, DM, and preventing progression of CKD. Most recent A1c up to 8.7. Risk of AKI is only with dehydration. Education provided today. No family history of medullary thyroid cancer. Seeing PCP in 2 days and cal receive follow up education at that time. - Pulmonary rehab pending  - Volume is difficult to assess today. Remains with 2+ pitting edema, dyspnea on exertion, abdominal distention, and fatigue. REDS test today was 34%, suggesting pulmonary edema is not contributing to symptoms. Repeat labs today show creatinine is 3.04, stable from last visit. Previously lost some weight, however weight increased this AM. -Increase torsemide to 60 mg daily. BMET in 1 week.  - High risk for pacer induced cardiomyopathy given high pacing burden   2. HTN - started on losartan 25mg  daily by nephrology - BP well controlled today.    3. Atrial fibrillation / flutter - s/p DCCV on 10/17/23 - continue apixaban 2.5mg  daily.  - s/p DCCV in 1/25 - normal pulse rate today - Amiodarone discontinued by EP on 01/03/24, reviewed notes.    4. CKD IV - see above -  GNFA21. GLP-1RA may help prevent progression. -  sCr elevated at  2.95 on labs from 02/28/24 -  Repeat BMP today   5. OSA - Compliant with CPAP.  - GLP-1 RA would also help   6.  T2DM - Glipizide stopped by PCP given hypoglycemia - Januvia 25mg  daily - A1C on 02/06/24 of 8.7% - Consider starting GLP-1RA  Follow up: nephrology on 03/13/24, CHF clinic in one week.  Please do not hesitate to reach out with questions or concerns,  Enos Fling, PharmD, CPP, BCPS Heart Failure Pharmacist  Phone - [phone] 03/10/2024 2:46 PM

## 2024-03-10 NOTE — Assessment & Plan Note (Signed)
 Recently evaluated by cardiology. Lasix changed to torsemide as outlined. Continues on torsemide. Has f/u with cardiology next week. Stable.

## 2024-03-10 NOTE — Assessment & Plan Note (Signed)
 Followed by hematology

## 2024-03-10 NOTE — Assessment & Plan Note (Signed)
 Admitted 10/19/23 - 10/23/23 - after presenting with sob and chest pain. Was s/p cardioversion for aflutter 10/17/23. Noticed worsening sob 11/14. Cardiology consulted. Diuresed. Echo EF 55-60, normal LV fxn and no RWMA, indeterminate diastolic, severely elevated pulmonary artery pressure. Lasix held secondary to acute kidney injury. Required oxygen initially. Weaned off. Renal function improved and oral diuretics resumed. Back in aflutter 10/21/23.  Is s/p DCCV 12/2023. Amiodarone was stopped. Continues on coreg 25mg  in am and 50mg  in pm. Continue eliquis. Continue f/u with cardiology.

## 2024-03-11 ENCOUNTER — Telehealth: Payer: Self-pay | Admitting: Pharmacist

## 2024-03-11 NOTE — Telephone Encounter (Incomplete)
 Called to confirm/remind patient of their appointment at the Advanced Heart Failure Clinic on 03/12/24***.   Appointment:   [] Confirmed  [x] Left mess   [] No answer/No voice mail  [] Phone not in service  Patient reminded to bring all medications and/or complete list.  Confirmed patient has transportation. Gave directions, instructed to utilize valet parking.

## 2024-03-12 ENCOUNTER — Ambulatory Visit: Attending: Cardiology | Admitting: Pharmacist

## 2024-03-12 VITALS — BP 130/72 | HR 66 | Wt 240.8 lb

## 2024-03-12 DIAGNOSIS — Z79899 Other long term (current) drug therapy: Secondary | ICD-10-CM | POA: Insufficient documentation

## 2024-03-12 DIAGNOSIS — I4892 Unspecified atrial flutter: Secondary | ICD-10-CM | POA: Insufficient documentation

## 2024-03-12 DIAGNOSIS — Z8719 Personal history of other diseases of the digestive system: Secondary | ICD-10-CM | POA: Insufficient documentation

## 2024-03-12 DIAGNOSIS — G4733 Obstructive sleep apnea (adult) (pediatric): Secondary | ICD-10-CM | POA: Diagnosis not present

## 2024-03-12 DIAGNOSIS — R609 Edema, unspecified: Secondary | ICD-10-CM | POA: Insufficient documentation

## 2024-03-12 DIAGNOSIS — I480 Paroxysmal atrial fibrillation: Secondary | ICD-10-CM | POA: Insufficient documentation

## 2024-03-12 DIAGNOSIS — I495 Sick sinus syndrome: Secondary | ICD-10-CM | POA: Diagnosis not present

## 2024-03-12 DIAGNOSIS — Z950 Presence of cardiac pacemaker: Secondary | ICD-10-CM | POA: Insufficient documentation

## 2024-03-12 DIAGNOSIS — E1122 Type 2 diabetes mellitus with diabetic chronic kidney disease: Secondary | ICD-10-CM | POA: Insufficient documentation

## 2024-03-12 DIAGNOSIS — R0601 Orthopnea: Secondary | ICD-10-CM | POA: Insufficient documentation

## 2024-03-12 DIAGNOSIS — I5032 Chronic diastolic (congestive) heart failure: Secondary | ICD-10-CM | POA: Diagnosis present

## 2024-03-12 DIAGNOSIS — N184 Chronic kidney disease, stage 4 (severe): Secondary | ICD-10-CM | POA: Diagnosis not present

## 2024-03-12 DIAGNOSIS — I5033 Acute on chronic diastolic (congestive) heart failure: Secondary | ICD-10-CM | POA: Diagnosis not present

## 2024-03-12 DIAGNOSIS — Z7984 Long term (current) use of oral hypoglycemic drugs: Secondary | ICD-10-CM | POA: Insufficient documentation

## 2024-03-12 DIAGNOSIS — I130 Hypertensive heart and chronic kidney disease with heart failure and stage 1 through stage 4 chronic kidney disease, or unspecified chronic kidney disease: Secondary | ICD-10-CM | POA: Insufficient documentation

## 2024-03-12 MED ORDER — CARVEDILOL 25 MG PO TABS: ORAL_TABLET | ORAL | Status: DC

## 2024-03-12 MED ORDER — METOLAZONE 2.5 MG PO TABS: ORAL_TABLET | ORAL | 0 refills | Status: DC

## 2024-03-12 MED ORDER — TORSEMIDE 20 MG PO TABS: 80.0000 mg | ORAL_TABLET | Freq: Every day | ORAL | 1 refills | Status: DC

## 2024-03-12 NOTE — Patient Instructions (Signed)
 It was a pleasure seeing you today!  MEDICATIONS: -We are changing your medications today -Increase torsemide back to 80 mg daily -Take metolazone 2.5 mg tablet once today and once tomorrow.  -Call if you have questions about your medications.  LABS: -Please return for labs .  NEXT APPOINTMENT: Return to clinic in 1 week with [name] [name].  In general, to take care of your heart failure: -Limit your fluid intake to 2 Liters (half-gallon) per day.   -Limit your salt intake to ideally 2-3 grams (2000-3000 mg) per day. -Weigh yourself daily and record, and bring that "weight diary" to your next appointment.  (Weight gain of 2-3 pounds in 1 day typically means fluid weight.) -The medications for your heart are to help your heart and help you live longer.   -Please contact us before stopping any of your heart medications.  Call the clinic at [phone] with questions or to reschedule future appointments.

## 2024-03-13 DIAGNOSIS — N2581 Secondary hyperparathyroidism of renal origin: Secondary | ICD-10-CM | POA: Diagnosis not present

## 2024-03-13 DIAGNOSIS — I129 Hypertensive chronic kidney disease with stage 1 through stage 4 chronic kidney disease, or unspecified chronic kidney disease: Secondary | ICD-10-CM | POA: Diagnosis not present

## 2024-03-13 DIAGNOSIS — N184 Chronic kidney disease, stage 4 (severe): Secondary | ICD-10-CM | POA: Diagnosis not present

## 2024-03-13 DIAGNOSIS — E785 Hyperlipidemia, unspecified: Secondary | ICD-10-CM | POA: Diagnosis not present

## 2024-03-13 DIAGNOSIS — E1122 Type 2 diabetes mellitus with diabetic chronic kidney disease: Secondary | ICD-10-CM | POA: Diagnosis not present

## 2024-03-13 DIAGNOSIS — D631 Anemia in chronic kidney disease: Secondary | ICD-10-CM | POA: Diagnosis not present

## 2024-03-13 DIAGNOSIS — I509 Heart failure, unspecified: Secondary | ICD-10-CM | POA: Diagnosis not present

## 2024-03-14 ENCOUNTER — Other Ambulatory Visit
Admission: RE | Admit: 2024-03-14 | Discharge: 2024-03-14 | Disposition: A | Discharge status: Home / Self Care | Attending: Cardiology | Admitting: Cardiology

## 2024-03-14 DIAGNOSIS — I5033 Acute on chronic diastolic (congestive) heart failure: Secondary | ICD-10-CM | POA: Diagnosis not present

## 2024-03-14 LAB — BRAIN NATRIURETIC PEPTIDE: B Natriuretic Peptide: 1357.1 pg/mL — ABNORMAL HIGH (ref 0.0–100.0)

## 2024-03-14 LAB — BASIC METABOLIC PANEL WITH GFR
Anion gap: 12 (ref 5–15)
BUN: 44 mg/dL — ABNORMAL HIGH (ref 8–23)
CO2: 30 mmol/L (ref 22–32)
Calcium: 10 mg/dL (ref 8.9–10.3)
Chloride: 99 mmol/L (ref 98–111)
Creatinine, Ser: 3.1 mg/dL — ABNORMAL HIGH (ref 0.44–1.00)
GFR, Estimated: 14 mL/min — ABNORMAL LOW (ref >60)
Glucose, Bld: 210 mg/dL — ABNORMAL HIGH (ref 70–99)
Potassium: 3 mmol/L — ABNORMAL LOW (ref 3.5–5.1)
Sodium: 141 mmol/L (ref 135–145)

## 2024-03-17 ENCOUNTER — Ambulatory Visit: Attending: Cardiology | Admitting: Pharmacist

## 2024-03-17 VITALS — BP 102/58 | HR 61 | Wt 229.0 lb

## 2024-03-17 DIAGNOSIS — Z79899 Other long term (current) drug therapy: Secondary | ICD-10-CM | POA: Insufficient documentation

## 2024-03-17 DIAGNOSIS — Z950 Presence of cardiac pacemaker: Secondary | ICD-10-CM | POA: Diagnosis not present

## 2024-03-17 DIAGNOSIS — I480 Paroxysmal atrial fibrillation: Secondary | ICD-10-CM | POA: Diagnosis not present

## 2024-03-17 DIAGNOSIS — I495 Sick sinus syndrome: Secondary | ICD-10-CM | POA: Insufficient documentation

## 2024-03-17 DIAGNOSIS — Z7901 Long term (current) use of anticoagulants: Secondary | ICD-10-CM | POA: Insufficient documentation

## 2024-03-17 DIAGNOSIS — I4892 Unspecified atrial flutter: Secondary | ICD-10-CM | POA: Insufficient documentation

## 2024-03-17 DIAGNOSIS — I5033 Acute on chronic diastolic (congestive) heart failure: Secondary | ICD-10-CM | POA: Insufficient documentation

## 2024-03-17 DIAGNOSIS — Z5181 Encounter for therapeutic drug level monitoring: Secondary | ICD-10-CM | POA: Diagnosis not present

## 2024-03-17 DIAGNOSIS — I5032 Chronic diastolic (congestive) heart failure: Secondary | ICD-10-CM | POA: Diagnosis present

## 2024-03-17 DIAGNOSIS — H353221 Exudative age-related macular degeneration, left eye, with active choroidal neovascularization: Secondary | ICD-10-CM | POA: Diagnosis not present

## 2024-03-17 DIAGNOSIS — G4733 Obstructive sleep apnea (adult) (pediatric): Secondary | ICD-10-CM | POA: Diagnosis not present

## 2024-03-17 DIAGNOSIS — I130 Hypertensive heart and chronic kidney disease with heart failure and stage 1 through stage 4 chronic kidney disease, or unspecified chronic kidney disease: Secondary | ICD-10-CM | POA: Insufficient documentation

## 2024-03-17 DIAGNOSIS — N184 Chronic kidney disease, stage 4 (severe): Secondary | ICD-10-CM | POA: Diagnosis not present

## 2024-03-17 DIAGNOSIS — E1122 Type 2 diabetes mellitus with diabetic chronic kidney disease: Secondary | ICD-10-CM | POA: Diagnosis not present

## 2024-03-17 MED ORDER — POTASSIUM CHLORIDE CRYS ER 20 MEQ PO TBCR: EXTENDED_RELEASE_TABLET | ORAL | 3 refills | Status: DC

## 2024-03-17 NOTE — Progress Notes (Signed)
 Advanced Heart Failure Clinic Note  PCP: Dellar Fenton, MD PCP-Cardiologist: Belva Boyden, MD HF-Cardiologist: Alwin Baars, DO  HPI: [name] [name] is a 89 y.o. female with HFpEF, paroxysmal atrial fibrillation, sick sinus syndrome status post permanent pacemaker, hypertension, OSA, CKD stage IV presenting today for follow up.   She establish care with heart failure in November 2024 when she was admitted with dyspnea on exertion and chest pressure.  At that time patient was found to be in atrial fibrillation with volume overload.  Echocardiogram during admission with EF of 55 to 60% but with severely elevated RVSP.  Patient was aggressively diuresed and underwent right heart catheterization which demonstrated preserved cardiac index and PA mean of 28 mmHg. She underwent cardioversion on 12/13/23.   Seen by Dr. Bruce Caper on 02/28/24 where she reported worsening shortness of breath and LEE over several weeks. During this time she has had multiple episodes of orthopnea & PND. Furosemide was changed to torsemide 80 mg x 5 days, then 40 mg daily.  Seen by CHF pharmacy on 03/05/24. Patient was feeling worse since transitioning from torsemide 80 mg daily to 40 mg daily. Appeared volume up, so torsemide was increased to 60 mg daily. Given limited options for HF, CKD, and DM, GLP-1RA was considered with plans to initiate next visit. Discussed with Dr. Dellar Fenton via telephone. Patient experienced pancreatitis 3 times ~1970s. In the SUSTAIN-6 trial, acute pancreatitis occurred in 9 patients in the semaglutide group and in 12 in the placebo group. Discussed with CHF MD and due to patient hesitancy and distant history of pancreatitis, the decision was made not to initiate semaglutide.    Seen by CHF pharmacy 03/12/24 where weight was up and patient experienced worsening shortness of breath and fatigue. Torsemide was increased to 80 mg daily and metolazone 2.5 mg was given x 2 days.  Today [name] [name]  returns to Heart Failure Clinic for pharmacist medication titration. Reports feeling significantly improved since last visit with ~13 lb weight loss. Reports improved fatigue, SOB with exertion, and LEE, now minimal. Denies shortness of breath at rest. Orthopnea requiring her to sleep in a recliner has persisted for the last few years and seems to be improved as well. Denies dizziness, lightheadedness, chest pain, PND, and orthostasis. Reports being able to complete all activities of daily living (ADLs), now with less frequent breaks. Is active throughout the day. Still goes to [facility] 3 days per week. Weight at home had been 233 pounds on 03/04/24, now down to 220 lbs this morning. Takes torsemide 80 mg daily and two doses of metolazone last week. Appetite is poor. Does follow a low sodium diet.    Current Heart Failure Medications: Loop diuretic: torsemide 80 mg daily Beta-Blocker: carvedilol 25 mg BID ACEI/ARB/ARNI: losartan 25 mg daily MRA: none SGLT2i: none Other: none  Has the patient been experiencing any side effects to the medications prescribed? No  Does the patient have any problems obtaining medications due to transportation or finances? No  Understanding of regimen: Excellent  Understanding of indications: Excellent  Potential of adherence: Excellent  Patient understands to avoid NSAIDs.  Patient understands to avoid decongestants.  Pertinent Lab Values: Creat  Date Value Ref Range Status  02/13/2017 1.48 (H) 0.60 - 0.93 mg/dL Final    Comment:      For patients > or = 87 years of age: The upper reference limit for Creatinine is approximately 13% higher for people identified as African-American.      Creatinine,  Ser  Date Value Ref Range Status  03/14/2024 3.10 (H) 0.44 - 1.00 mg/dL Final   BUN  Date Value Ref Range Status  03/14/2024 44 (H) 8 - 23 mg/dL Final  40/98/1191 35 (H) 8 - 27 mg/dL Final  47/82/9562 18 7 - 18 mg/dL Final   Potassium  Date Value Ref  Range Status  03/14/2024 3.0 (L) 3.5 - 5.1 mmol/L Final  08/04/2014 3.9 3.5 - 5.1 mmol/L Final   Sodium  Date Value Ref Range Status  03/14/2024 141 135 - 145 mmol/L Final  02/28/2024 143 134 - 144 mmol/L Final  08/03/2014 138 136 - 145 mmol/L Final   B Natriuretic Peptide  Date Value Ref Range Status  03/14/2024 1,357.1 (H) 0.0 - 100.0 pg/mL Final    Comment:    Performed at [facility], [address] [place], [place] [address]   Magnesium  Date Value Ref Range Status  10/22/2023 2.2 1.7 - 2.4 mg/dL Final    Comment:    Performed at [facility], [address] [place], [place] [address]   TSH  Date Value Ref Range Status  02/06/2024 1.49 0.35 - 5.50 uIU/mL Final   DATA REVIEW   ECG: 11/07/23: RV paced rhythm   03/12/24: RV paced rhythm   ECHO: 10/20/23: LVEF 55%-60%      CATH: HEMODYNAMICS: RA:                  6 mmHg (mean) RV:                  45/1-6 mmHg PA:                  45/15 mmHg (28 mean) PCWP:            13 mmHg (mean)                                      Estimated Fick CO/CI   6.3 L/min, 2.9 L/min/m2                                              TPG                 15  mmHg                                            PVR                 2.4 Wood Units  PAPi                5    Vital Signs: Today's Vitals   03/17/24 1252  BP: (!) 102/58  Pulse: 61  SpO2: 96%  Weight: 229 lb (103.9 kg)    Assessment/Plan: Acute on chronic HFpEF - TTE w/ LVEF 60% on 10/20/23 and low normal RV function. - Unfortunately her severely reduced GFR limits her therapeutic options. Hesitant to initiate SGLT2i given eGFR <20. Finerenone not indicated for GFR <25 (discontinue if less than 15). GLP-1RA not ideal given history of pancreatitis. - Most recent A1c up to 8.7. Considered adding semaglutide since it has a lower  incidence of pancreatitis and could benefit HF, DM, and CKD, however given patient's age and hesitancy to start, will hold off. -  Pulmonary rehab pending  - Euvolemic today. Only trace LE edema. Symptoms improved. Repeat labs 03/14/24 show creatinine of 3.1 from 3.04. Weight is down 13 lbs. Potassium was low at 3, suspect due to metolazone.  - Continue torsemide 80 mg daily. Take potassium 40 meq once today and tomorrow, then as directed by clinic. Will check BMET in 2 weeks. She may require maintenance potassium pending 2 week BMET. - Patient aware to only take metolazone when instructed by the clinic. - Patient reports recently being prescribed spironolactone, but has not been taking. It was contraindicated for woman in TOPCAT trial when creatinine was >2. Patient instructed to delay initiation until speaking to Dr. Bruce Caper - High risk for pacer induced cardiomyopathy given high pacing burden   2. HTN - Started on losartan 25mg  daily by nephrology - BP controlled today.    3. Atrial fibrillation / flutter - s/p DCCV on 10/17/23 - continue apixaban 2.5mg  daily.  - s/p DCCV in 12/2023 - Given patient reported feeling similar to last time she was in AF, EKG obtained today showing RV paced rhythm. - Amiodarone discontinued by EP on 01/03/24, reviewed notes.    4. CKD IV - see above -  [id number].  -  sCr elevated at 3.1   -  Repeat BMP 2 weeks   5. OSA - Compliant with CPAP.     6. T2DM - Glipizide stopped by PCP given hypoglycemia - Januvia 25mg  daily - A1C on 02/06/24 of 8.7%  - If GLP-1RA were added, Januvia would require stopping  Follow up: Dr. Bruce Caper in 3 weeks.  Please do not hesitate to reach out with questions or concerns,  Bevely Brush, PharmD, CPP, BCPS Heart Failure Pharmacist  Phone - [phone] 03/17/2024 1:22 PM

## 2024-03-17 NOTE — Patient Instructions (Addendum)
 It was a pleasure seeing you today!  MEDICATIONS: -We are changing your medications today -Take potassium (Klor-Con) 40 meq (2 tablets) today and again tomorrow. After these two doses, only take when instructed by clinic. -Call if you have questions about your medications.  LABS: -We will call you if your labs need attention.  NEXT APPOINTMENT: Return to clinic in 3 weeks with Dr. Bruce Caper.  In general, to take care of your heart failure: -Limit your fluid intake to 2 Liters (half-gallon) per day.   -Limit your salt intake to ideally 2-3 grams (2000-3000 mg) per day. -Weigh yourself daily and record, and bring that "weight diary" to your next appointment.  (Weight gain of 2-3 pounds in 1 day typically means fluid weight.) -The medications for your heart are to help your heart and help you live longer.   -Please contact us  before stopping any of your heart medications.

## 2024-03-18 ENCOUNTER — Encounter: Payer: Self-pay | Admitting: Cardiovascular Disease

## 2024-03-18 ENCOUNTER — Other Ambulatory Visit: Payer: Self-pay | Admitting: Internal Medicine

## 2024-03-18 ENCOUNTER — Telehealth: Payer: Self-pay | Admitting: Internal Medicine

## 2024-03-18 ENCOUNTER — Encounter: Payer: Self-pay | Admitting: Internal Medicine

## 2024-03-18 ENCOUNTER — Other Ambulatory Visit

## 2024-03-18 DIAGNOSIS — K219 Gastro-esophageal reflux disease without esophagitis: Secondary | ICD-10-CM

## 2024-03-18 NOTE — Telephone Encounter (Signed)
 Her labs are already ordered. Please call her and schedule fasting lab appointment within the week prior to her June appt.

## 2024-03-18 NOTE — Telephone Encounter (Signed)
 Copied from CRM [id number]. Topic: Clinical - Medication Refill >> Mar 18, 2024  8:23 AM Juluis Ok wrote: Most Recent Primary Care Visit:  Provider: Dellar Fenton  Department: [facility]  Visit Type: OFFICE VISIT  Date: 03/07/2024  Medication: pantoprazole (PROTONIX) 40 MG tablet  Has the patient contacted their pharmacy? No (Agent: If no, request that the patient contact the pharmacy for the refill. If patient does not wish to contact the pharmacy document the reason why and proceed with request.) (Agent: If yes, when and what did the pharmacy advise?)  Is this the correct pharmacy for this prescription? Yes If no, delete pharmacy and type the correct one.  This is the patient's preferred pharmacy:  [facility] [address] [place] - [place], [place] - [address] [place] [address] Phone: [phone] Fax: [phone]   Has the prescription been filled recently? No  Is the patient out of the medication? Yes  Has the patient been seen for an appointment in the last year OR does the patient have an upcoming appointment? Yes  Can we respond through MyChart? No  Agent: Please be advised that Rx refills may take up to 3 business days. We ask that you follow-up with your pharmacy.

## 2024-03-18 NOTE — Telephone Encounter (Signed)
 Copied from CRM [id number]. Topic: Clinical - Request for Lab/Test Order >> Mar 18, 2024  8:29 AM Juluis Ok wrote: Reason for CRM: Patient requesting to have  fasting labs scheduled 2 days prior to her 6/24 appointment. Please contact patient for scheduling.

## 2024-03-19 ENCOUNTER — Other Ambulatory Visit: Payer: Self-pay

## 2024-03-19 ENCOUNTER — Other Ambulatory Visit: Payer: Self-pay | Admitting: Cardiology

## 2024-03-19 ENCOUNTER — Telehealth: Payer: Self-pay | Admitting: Internal Medicine

## 2024-03-19 ENCOUNTER — Encounter: Payer: Self-pay | Admitting: Cardiology

## 2024-03-19 MED ORDER — CARVEDILOL 25 MG PO TABS: ORAL_TABLET | ORAL | 11 refills | Status: DC

## 2024-03-19 NOTE — Telephone Encounter (Signed)
 Pt is requesting a 90 day supply. Please address

## 2024-03-19 NOTE — Telephone Encounter (Signed)
*  STAT* If patient is at the pharmacy, call can be transferred to refill team.   1. Which medications need to be refilled? (please list name of each medication and dose if known) carvedilol (COREG) 25 MG tablet    2. Would you like to learn more about the convenience, safety, & potential cost savings by using the [facility]?    3. Are you open to using the [facility] (Type [facility].    4. Which pharmacy/location (including street and city if local pharmacy) is medication to be sent to? [facility] [address] - [place],  - [address]    5. Do they need a 30 day or 90 day supply? 90

## 2024-03-20 ENCOUNTER — Other Ambulatory Visit: Payer: Self-pay

## 2024-03-20 MED ORDER — CARVEDILOL 25 MG PO TABS: ORAL_TABLET | ORAL | 3 refills | Status: DC

## 2024-03-20 NOTE — Progress Notes (Signed)
 Pt called stating she needs 90 day supply of her Carvedilol instead of a 30 day supply. Resent pt's rx as 90 days. Pt aware and had no further concerns.

## 2024-03-24 ENCOUNTER — Ambulatory Visit (INDEPENDENT_AMBULATORY_CARE_PROVIDER_SITE_OTHER): Payer: Self-pay

## 2024-03-24 DIAGNOSIS — I495 Sick sinus syndrome: Secondary | ICD-10-CM

## 2024-03-25 LAB — CUP PACEART REMOTE DEVICE CHECK
Battery Impedance: 1567 Ohm
Battery Remaining Longevity: 49 mo
Battery Voltage: 2.76 V
Brady Statistic RV Percent Paced: 100 %
Date Time Interrogation Session: 20250421090913
Implantable Lead Connection Status: 753985
Implantable Lead Connection Status: 753985
Implantable Lead Implant Date: 20080117
Implantable Lead Implant Date: 20080117
Implantable Lead Location: 753859
Implantable Lead Location: 753860
Implantable Lead Model: 5076
Implantable Lead Model: 5076
Implantable Pulse Generator Implant Date: 20141229
Lead Channel Impedance Value: 651 Ohm
Lead Channel Impedance Value: 67 Ohm
Lead Channel Pacing Threshold Amplitude: 0.875 V
Lead Channel Pacing Threshold Pulse Width: 0.4 ms
Lead Channel Setting Pacing Amplitude: 2.5 V
Lead Channel Setting Pacing Pulse Width: 0.4 ms
Lead Channel Setting Sensing Sensitivity: 2.8 mV
Zone Setting Status: 755011
Zone Setting Status: 755011

## 2024-03-30 ENCOUNTER — Encounter: Payer: Self-pay | Admitting: Cardiology

## 2024-04-01 DIAGNOSIS — G4733 Obstructive sleep apnea (adult) (pediatric): Secondary | ICD-10-CM | POA: Diagnosis not present

## 2024-04-09 ENCOUNTER — Encounter: Admitting: Cardiology

## 2024-04-09 DIAGNOSIS — I129 Hypertensive chronic kidney disease with stage 1 through stage 4 chronic kidney disease, or unspecified chronic kidney disease: Secondary | ICD-10-CM | POA: Diagnosis not present

## 2024-04-09 DIAGNOSIS — K50919 Crohn's disease, unspecified, with unspecified complications: Secondary | ICD-10-CM | POA: Diagnosis not present

## 2024-04-09 DIAGNOSIS — I509 Heart failure, unspecified: Secondary | ICD-10-CM | POA: Diagnosis not present

## 2024-04-09 DIAGNOSIS — I10 Essential (primary) hypertension: Secondary | ICD-10-CM | POA: Diagnosis not present

## 2024-04-09 DIAGNOSIS — E785 Hyperlipidemia, unspecified: Secondary | ICD-10-CM | POA: Diagnosis not present

## 2024-04-09 DIAGNOSIS — N2581 Secondary hyperparathyroidism of renal origin: Secondary | ICD-10-CM | POA: Diagnosis not present

## 2024-04-09 DIAGNOSIS — R600 Localized edema: Secondary | ICD-10-CM | POA: Diagnosis not present

## 2024-04-09 DIAGNOSIS — D631 Anemia in chronic kidney disease: Secondary | ICD-10-CM | POA: Diagnosis not present

## 2024-04-09 DIAGNOSIS — I480 Paroxysmal atrial fibrillation: Secondary | ICD-10-CM | POA: Diagnosis not present

## 2024-04-09 DIAGNOSIS — N184 Chronic kidney disease, stage 4 (severe): Secondary | ICD-10-CM | POA: Diagnosis not present

## 2024-04-09 DIAGNOSIS — E1122 Type 2 diabetes mellitus with diabetic chronic kidney disease: Secondary | ICD-10-CM | POA: Diagnosis not present

## 2024-04-09 DIAGNOSIS — K219 Gastro-esophageal reflux disease without esophagitis: Secondary | ICD-10-CM | POA: Diagnosis not present

## 2024-04-15 ENCOUNTER — Encounter: Admitting: Cardiology

## 2024-04-16 DIAGNOSIS — E1122 Type 2 diabetes mellitus with diabetic chronic kidney disease: Secondary | ICD-10-CM | POA: Diagnosis not present

## 2024-04-16 DIAGNOSIS — N2581 Secondary hyperparathyroidism of renal origin: Secondary | ICD-10-CM | POA: Diagnosis not present

## 2024-04-16 DIAGNOSIS — N184 Chronic kidney disease, stage 4 (severe): Secondary | ICD-10-CM | POA: Diagnosis not present

## 2024-04-16 DIAGNOSIS — I129 Hypertensive chronic kidney disease with stage 1 through stage 4 chronic kidney disease, or unspecified chronic kidney disease: Secondary | ICD-10-CM | POA: Diagnosis not present

## 2024-04-16 DIAGNOSIS — D631 Anemia in chronic kidney disease: Secondary | ICD-10-CM | POA: Diagnosis not present

## 2024-04-16 DIAGNOSIS — E785 Hyperlipidemia, unspecified: Secondary | ICD-10-CM | POA: Diagnosis not present

## 2024-04-21 ENCOUNTER — Telehealth: Payer: Self-pay

## 2024-04-21 NOTE — Telephone Encounter (Signed)
 Patient was identified as falling into the [place] Measure - Diabetes.   Patient was: Appointment already scheduled for:  05/27/24.

## 2024-04-22 ENCOUNTER — Ambulatory Visit (INDEPENDENT_AMBULATORY_CARE_PROVIDER_SITE_OTHER): Admitting: Family Medicine

## 2024-04-22 ENCOUNTER — Encounter: Payer: Self-pay | Admitting: Family Medicine

## 2024-04-22 VITALS — BP 110/62 | HR 63 | Temp 97.6°F | Resp 20 | Ht 64.0 in | Wt 222.0 lb

## 2024-04-22 DIAGNOSIS — J069 Acute upper respiratory infection, unspecified: Secondary | ICD-10-CM | POA: Diagnosis not present

## 2024-04-22 DIAGNOSIS — I4891 Unspecified atrial fibrillation: Secondary | ICD-10-CM | POA: Diagnosis not present

## 2024-04-22 DIAGNOSIS — G4733 Obstructive sleep apnea (adult) (pediatric): Secondary | ICD-10-CM | POA: Diagnosis not present

## 2024-04-22 DIAGNOSIS — I4892 Unspecified atrial flutter: Secondary | ICD-10-CM

## 2024-04-22 DIAGNOSIS — J309 Allergic rhinitis, unspecified: Secondary | ICD-10-CM

## 2024-04-22 DIAGNOSIS — N184 Chronic kidney disease, stage 4 (severe): Secondary | ICD-10-CM

## 2024-04-22 MED ORDER — AZELASTINE HCL 0.1 % NA SOLN: 2.0000 | Freq: Two times a day (BID) | NASAL | 1 refills | Status: DC

## 2024-04-22 MED ORDER — LEVOCETIRIZINE DIHYDROCHLORIDE 5 MG PO TABS: 5.0000 mg | ORAL_TABLET | Freq: Every evening | ORAL | 0 refills | Status: DC

## 2024-04-22 MED ORDER — FLUTICASONE PROPIONATE 50 MCG/ACT NA SUSP: 2.0000 | Freq: Two times a day (BID) | NASAL | 1 refills | Status: DC

## 2024-04-22 MED ORDER — BENZONATATE 100 MG PO CAPS: 200.0000 mg | ORAL_CAPSULE | Freq: Three times a day (TID) | ORAL | 0 refills | Status: DC | PRN

## 2024-04-22 NOTE — Patient Instructions (Signed)
 It was a pleasure meeting you today. Thank you for allowing me to take part in your health care.  Our goals for today as we discussed include:  Start Flonase  2 sprays two times a day Start Asteline 2 sprays two times a day Start Xyzal 5 mg at night  Take Tessalon  Perles 1 tablet three times a day as needed for cough  You can take Tylenol  and/or Ibuprofen as needed for fever reduction and pain relief.   For cough: honey 1/2 to 1 teaspoon (you can dilute the honey in water or another fluid).  You can also use guaifenesin  and dextromethorphan for cough. You can use a humidifier for chest congestion and cough.  If you don't have a humidifier, you can sit in the bathroom with the hot shower running.      For sore throat: try warm salt water gargles, cepacol lozenges, throat spray, warm tea or water with lemon/honey, popsicles or ice, or OTC cold relief medicine for throat discomfort.   For congestion: take a daily anti-histamine like Zyrtec, Claritin, and a oral decongestant, such as pseudoephedrine.  You can also use Flonase  1-2 sprays in each nostril daily.   It is important to stay hydrated: drink plenty of fluids (water, gatorade/powerade/pedialyte, juices, or teas) to keep your throat moisturized and help further relieve irritation/discomfort.   If no improvement please notify MD  This is a list of the screening recommended for you and due dates:  Health Maintenance  Topic Date Due   DEXA scan (bone density measurement)  Never done   COVID-19 Vaccine (4 - 2024-25 season) 08/05/2023   Eye exam for diabetics  10/20/2023   Zoster (Shingles) Vaccine (1 of 2) 05/10/2024*   Flu Shot  07/04/2024   Hemoglobin A1C  08/08/2024   Complete foot exam   11/11/2024   Medicare Annual Wellness Visit  01/06/2025   Mammogram  01/16/2026   DTaP/Tdap/Td vaccine (2 - Td or Tdap) 09/18/2026   Pneumonia Vaccine  Completed   HPV Vaccine  Aged Out   Meningitis B Vaccine  Aged Out  *Topic was postponed.  The date shown is not the original due date.      If you have any questions or concerns, please do not hesitate to call the office at [phone].  I look forward to our next visit and until then take care and stay safe.  Regards,   Valli Gaw, MD   [facility]

## 2024-04-22 NOTE — Progress Notes (Unsigned)
 SUBJECTIVE:   Chief Complaint  Patient presents with   Allergies   Sinusitis    Been going on for 2 weeks    HPI Presents for acute visit  Discussed the use of AI scribe software for clinical note transcription with the patient, who gave verbal consent to proceed.  History of Present Illness [name] [name] is an 89 year old female with allergies and sinus issues who presents with persistent cough and nasal drainage.  She has been experiencing sinus and allergy symptoms since May 8th, beginning with a sore throat. Her symptoms include a persistent cough productive of phlegm and rhinorrhea. She denies fever or pleuritic pain, but reports sharp back pain from excessive coughing last week. Wheezing is present, but no dyspnea.  She experiences difficulty sleeping due to her CPAP mask bubbling, which she finds aggravating, although she generally sleeps well at night. She feels significant postnasal drip, which she can expectorate when speaking. Her eating and drinking are unaffected.  She has a history of using Flonase  and nasal sprays and has recently tried Zyrtec, Mucinex , and Tylenol  without relief. She last took Tylenol  yesterday. She also reports epistaxis yesterday. She is on Eliquis  and notes dryness from her CPAP mask.  Her current medications include carvedilol , which was recently adjusted from 25 mg to 12.5 mg twice daily due to her atrial fibrillation. She also takes torasemide, noting increased nocturia and reduced peripheral edema. Her blood sugar was 117 this morning, and she often feels sleepy.  No fever, dyspnea, nasal pressure, headaches, or myalgias. She reports a recent earache and epistaxis.     PERTINENT PMH / PSH: As above  OBJECTIVE:  BP 110/62   Pulse 63   Temp 97.6 F (36.4 C)   Resp 20   Ht 5\' 4"  (1.626 m)   Wt 222 lb (100.7 kg)   SpO2 99%   BMI 38.11 kg/m    Physical Exam Vitals reviewed.  Constitutional:      General: She is not in acute  distress.    Appearance: Normal appearance. She is normal weight. She is not ill-appearing, toxic-appearing or diaphoretic.  HENT:     Right Ear: Tympanic membrane, ear canal and external ear normal.     Left Ear: Tympanic membrane, ear canal and external ear normal.  Eyes:     General:        Right eye: No discharge.        Left eye: No discharge.     Conjunctiva/sclera: Conjunctivae normal.  Cardiovascular:     Rate and Rhythm: Normal rate and regular rhythm.     Heart sounds: Normal heart sounds.  Pulmonary:     Effort: Pulmonary effort is normal.     Breath sounds: Normal breath sounds. No wheezing.  Abdominal:     General: Bowel sounds are normal.  Musculoskeletal:        General: Normal range of motion.  Skin:    General: Skin is warm and dry.  Neurological:     General: No focal deficit present.     Mental Status: She is alert and oriented to person, place, and time. Mental status is at baseline.  Psychiatric:        Mood and Affect: Mood normal.        Behavior: Behavior normal.        Thought Content: Thought content normal.        Judgment: Judgment normal.  04/22/2024    9:42 AM 01/07/2024    3:39 PM 10/11/2023    9:43 AM 03/30/2017   11:35 AM 02/13/2017    2:06 PM  Depression screen PHQ 2/9  Decreased Interest 0 0 3 0 0  Down, Depressed, Hopeless 0 0 0 0 0  PHQ - 2 Score 0 0 3 0 0  Altered sleeping 1 3 3 3    Tired, decreased energy 1 2 3 1    Change in appetite 0 0 0 0   Feeling bad or failure about yourself  0  0 0   Trouble concentrating 0 0 0 0   Moving slowly or fidgety/restless 0 0 0 0   Suicidal thoughts 0 0 0 0   PHQ-9 Score 2 5 9 4    Difficult doing work/chores Not difficult at all Not difficult at all Somewhat difficult Not difficult at all       04/22/2024    9:42 AM  GAD 7 : Generalized Anxiety Score  Nervous, Anxious, on Edge 0  Control/stop worrying 0  Worry too much - different things 0  Trouble relaxing 0  Restless 0  Easily  annoyed or irritable 0  Afraid - awful might happen 0  Total GAD 7 Score 0  Anxiety Difficulty Not difficult at all    ASSESSMENT/PLAN:  Allergic rhinitis, unspecified seasonality, unspecified trigger Assessment & Plan: Symptoms likely due to allergies and sinus drainage, exacerbated by CPAP use. - Prescribed antihistamines and nasal sprays. - Advised to monitor for fever or worsening symptoms, consider antibiotics if needed.   Viral URI with cough Assessment & Plan: Improving.  Cough likely from PND and allergies.  Lungs clear on exam -Symptomatic treatement - Tessalon  perles - Strict return precautions provided  Orders: -     Benzonatate ; Take 2 capsules (200 mg total) by mouth 3 (three) times daily as needed.  Dispense: 20 capsule; Refill: 0  CKD (chronic kidney disease) stage 4, GFR 15-29 ml/min (HCC) Assessment & Plan: Managed by nephrologist, recent carvedilol  dose adjustment due to kidney function.   Atrial fibrillation and flutter (HCC) Assessment & Plan: Managed with carvedilol , recent dose adjustment by nephrologist, reports sleepiness possibly due to medication. Continue Eliquis  Follow up with Cardiologist   OSA (obstructive sleep apnea) Assessment & Plan: CPAP causes discomfort and bubbling, affecting sleep quality. Follow up with Pulmonology for adjustment  Orders: -     Fluticasone  Propionate; Place 2 sprays into both nostrils in the morning and at bedtime.  Dispense: 16 g; Refill: 1 -     Azelastine HCl; Place 2 sprays into both nostrils 2 (two) times daily. Use in each nostril as directed  Dispense: 30 mL; Refill: 1 -     Levocetirizine Dihydrochloride; Take 1 tablet (5 mg total) by mouth every evening.  Dispense: 30 tablet; Refill: 0     PDMP reviewed  Return if symptoms worsen or fail to improve, for PCP.  Valli Gaw, MD

## 2024-04-24 ENCOUNTER — Other Ambulatory Visit: Payer: Self-pay | Admitting: Family

## 2024-04-27 ENCOUNTER — Encounter: Payer: Self-pay | Admitting: Family Medicine

## 2024-04-27 DIAGNOSIS — J069 Acute upper respiratory infection, unspecified: Secondary | ICD-10-CM | POA: Insufficient documentation

## 2024-04-27 NOTE — Assessment & Plan Note (Signed)
 Managed with carvedilol , recent dose adjustment by nephrologist, reports sleepiness possibly due to medication. Continue Eliquis  Follow up with Cardiologist

## 2024-04-27 NOTE — Assessment & Plan Note (Signed)
 Improving.  Cough likely from PND and allergies.  Lungs clear on exam -Symptomatic treatement - Tessalon  perles - Strict return precautions provided

## 2024-04-27 NOTE — Assessment & Plan Note (Signed)
 Managed by nephrologist, recent carvedilol  dose adjustment due to kidney function.

## 2024-04-27 NOTE — Assessment & Plan Note (Signed)
 CPAP causes discomfort and bubbling, affecting sleep quality. Follow up with Pulmonology for adjustment

## 2024-04-27 NOTE — Assessment & Plan Note (Signed)
 Symptoms likely due to allergies and sinus drainage, exacerbated by CPAP use. - Prescribed antihistamines and nasal sprays. - Advised to monitor for fever or worsening symptoms, consider antibiotics if needed.

## 2024-04-29 DIAGNOSIS — E113293 Type 2 diabetes mellitus with mild nonproliferative diabetic retinopathy without macular edema, bilateral: Secondary | ICD-10-CM | POA: Diagnosis not present

## 2024-04-29 DIAGNOSIS — H43823 Vitreomacular adhesion, bilateral: Secondary | ICD-10-CM | POA: Diagnosis not present

## 2024-04-29 DIAGNOSIS — H35033 Hypertensive retinopathy, bilateral: Secondary | ICD-10-CM | POA: Diagnosis not present

## 2024-04-29 DIAGNOSIS — H353221 Exudative age-related macular degeneration, left eye, with active choroidal neovascularization: Secondary | ICD-10-CM | POA: Diagnosis not present

## 2024-04-29 DIAGNOSIS — H401132 Primary open-angle glaucoma, bilateral, moderate stage: Secondary | ICD-10-CM | POA: Diagnosis not present

## 2024-04-29 DIAGNOSIS — H35361 Drusen (degenerative) of macula, right eye: Secondary | ICD-10-CM | POA: Diagnosis not present

## 2024-04-29 DIAGNOSIS — Z961 Presence of intraocular lens: Secondary | ICD-10-CM | POA: Diagnosis not present

## 2024-05-01 ENCOUNTER — Telehealth: Payer: Self-pay

## 2024-05-01 NOTE — Telephone Encounter (Signed)
 Copied from CRM [id number]. Topic: Clinical - Medical Advice >> May 01, 2024  2:36 PM [name] [name] wrote: Reason for CRM: Pt states that she saw PCP on 5/20 for runny nose and cough, mucus and states this is still ongoing since 04/10/2024.  She states that the medication prescribed to her is not helping and she wants to know what she can do to knock this out once and for all. States maybe it is time for antibiotics or something stronger to help her get better. Below is her pharmacy and contact info  [facility] [address] - [place], [place] - [address] [place] [address] Phone: [phone] Fax: [phone] Hours: Not open 24 hours  Patient callback is [phone]

## 2024-05-02 ENCOUNTER — Other Ambulatory Visit: Payer: Self-pay

## 2024-05-02 ENCOUNTER — Other Ambulatory Visit: Payer: Self-pay | Admitting: Family Medicine

## 2024-05-02 NOTE — Telephone Encounter (Signed)
 Called pt and she stated that she No fever cough is just worse and nose is running. No SOB when breathing. Pt stated that she is coughing up a lot of phlegm, and she need something to get rid of the cough.

## 2024-05-02 NOTE — Telephone Encounter (Signed)
 Refill request for Torsemide  20 mg take two tablets by mouth one time daily.. The patients dose was changed by the heart failure clinic.  I faxed the Rx request from the pharmacy to heart failure clinic.

## 2024-05-02 NOTE — Telephone Encounter (Signed)
 Pt would like to know if something else can be called in or does she need to be re-evaluated? Pt was just seen on 5/20.

## 2024-05-02 NOTE — Telephone Encounter (Signed)
 Spoke with pt to let her know that she will need to be re-evaluated since her cough is worsening. Pt gave a verbal understanding.

## 2024-05-07 ENCOUNTER — Ambulatory Visit: Attending: Cardiology | Admitting: Internal Medicine

## 2024-05-07 ENCOUNTER — Encounter: Payer: Self-pay | Admitting: Internal Medicine

## 2024-05-07 VITALS — BP 116/56 | HR 67 | Wt 221.2 lb

## 2024-05-07 DIAGNOSIS — I480 Paroxysmal atrial fibrillation: Secondary | ICD-10-CM | POA: Insufficient documentation

## 2024-05-07 DIAGNOSIS — Z7901 Long term (current) use of anticoagulants: Secondary | ICD-10-CM | POA: Insufficient documentation

## 2024-05-07 DIAGNOSIS — I130 Hypertensive heart and chronic kidney disease with heart failure and stage 1 through stage 4 chronic kidney disease, or unspecified chronic kidney disease: Secondary | ICD-10-CM | POA: Diagnosis not present

## 2024-05-07 DIAGNOSIS — E1122 Type 2 diabetes mellitus with diabetic chronic kidney disease: Secondary | ICD-10-CM | POA: Diagnosis not present

## 2024-05-07 DIAGNOSIS — I4892 Unspecified atrial flutter: Secondary | ICD-10-CM | POA: Insufficient documentation

## 2024-05-07 DIAGNOSIS — Z950 Presence of cardiac pacemaker: Secondary | ICD-10-CM | POA: Diagnosis not present

## 2024-05-07 DIAGNOSIS — G4733 Obstructive sleep apnea (adult) (pediatric): Secondary | ICD-10-CM | POA: Diagnosis not present

## 2024-05-07 DIAGNOSIS — R059 Cough, unspecified: Secondary | ICD-10-CM | POA: Insufficient documentation

## 2024-05-07 DIAGNOSIS — R0989 Other specified symptoms and signs involving the circulatory and respiratory systems: Secondary | ICD-10-CM | POA: Insufficient documentation

## 2024-05-07 DIAGNOSIS — I495 Sick sinus syndrome: Secondary | ICD-10-CM | POA: Diagnosis not present

## 2024-05-07 DIAGNOSIS — N184 Chronic kidney disease, stage 4 (severe): Secondary | ICD-10-CM | POA: Diagnosis not present

## 2024-05-07 DIAGNOSIS — Z7984 Long term (current) use of oral hypoglycemic drugs: Secondary | ICD-10-CM | POA: Insufficient documentation

## 2024-05-07 DIAGNOSIS — I5032 Chronic diastolic (congestive) heart failure: Secondary | ICD-10-CM | POA: Insufficient documentation

## 2024-05-07 NOTE — Patient Instructions (Addendum)
 Medication Changes:  No medication changes.   Lab Work:  Go over to the [facility]. Go pass the gift shop and have your blood work completed.  We will only call you if the results are abnormal or if the provider would like to make medication changes.   Follow-Up in: 3 months with Dr. Sabharwal.  Our Doctors' schedules are NOT open yet for 3 months. We will place you on our recall list. Once they are available, we will call you to schedule your follow up appointment.   At the [facility], you and your health needs are our priority. We have a designated team specialized in the treatment of Heart Failure. This Care Team includes your primary Heart Failure Specialized Cardiologist (physician), Advanced Practice Providers (APPs- Physician Assistants and Nurse Practitioners), and Pharmacist who all work together to provide you with the care you need, when you need it.   You may see any of the following providers on your designated Care Team at your next follow up:  Dr. Jules Oar Dr. Peder Bourdon Dr. Alwin Baars Dr. Judyth Nunnery Shawnee Dellen, FNP Bevely Brush, RPH-CPP  Please be sure to bring in all your medications bottles to every appointment.   Need to Contact Us :  If you have any questions or concerns before your next appointment please send us  a message through [place] or call our office at [phone].    TO LEAVE A MESSAGE FOR THE NURSE SELECT OPTION 2, PLEASE LEAVE A MESSAGE INCLUDING: YOUR NAME DATE OF BIRTH CALL BACK NUMBER REASON FOR CALL**this is important as we prioritize the call backs  YOU WILL RECEIVE A CALL BACK THE SAME DAY AS LONG AS YOU CALL BEFORE 4:00 PM

## 2024-05-07 NOTE — Progress Notes (Signed)
 [facility] NOTE  Referring Physician: Dellar Fenton, MD  Primary Care: Dellar Fenton, MD  HPI: [name] [name] is a 89 y.o. female with HFpEF, paroxysmal atrial fibrillation, sick sinus syndrome status post permanent pacemaker, hypertension, OSA, CKD stage IV presenting today for follow up.  She establish care with heart failure in November 2024 when she was admitted with dyspnea on exertion and chest pressure.  At that time patient was found to be in atrial fibrillation with volume overload.  Echocardiogram during admission with EF of 55 to 60% but with severely elevated RVSP.  Patient was aggressively diuresed and underwent right heart catheterization which demonstrated preserved cardiac index and PA mean of 28 mmHg.  Underwent cardioversion on 12/13/23.   Interval hx:  - At last visit volume was elevated. She was switched from lasix  to torsemide . She said fluid and breathing much improved. Has lost 18 pounds in 2 months. Compliant with meds and CPAP. Does all ADLs without problem. [facility].  Main issue is cough and runny nose which has been going for several weeks. Saw PCP who treated her for allergies. No fevers or chills. She is requesting an antibiotic. No bleeding with Eliquis .   Current Outpatient Medications  Medication Sig Dispense Refill   acetaminophen  (TYLENOL ) 500 MG tablet Take 1,000 mg by mouth every 6 (six) hours as needed for moderate pain.      allopurinol  (ZYLOPRIM ) 100 MG tablet Take 1 tablet (100 mg total) by mouth daily. 90 tablet 1   atorvastatin  (LIPITOR) 20 MG tablet TAKE 1 TABLET (20 MG TOTAL) BY MOUTH DAILY. 90 tablet 3   Blood Glucose Monitoring Suppl (FIFTY50 GLUCOSE METER 2.0) w/Device KIT      calcitRIOL  (ROCALTROL ) 0.25 MCG capsule TAKE 1 CAPSULE (0.25 MCG TOTAL) BY MOUTH 1 (ONE) TIME EACH DAY     carvedilol  (COREG ) 12.5 MG tablet Take 12.5 mg by mouth 2 (two) times daily with a meal.     ELIQUIS  2.5 MG TABS  tablet TAKE ONE TABLET BY MOUTH TWICE A DAY 60 tablet 0   ferrous sulfate  325 (65 FE) MG EC tablet Take 325 mg by mouth daily.      glipiZIDE  (GLUCOTROL ) 10 MG tablet Take 5 mg by mouth daily.     Glycerin , Laxative, (FLEET LIQUID GLYCERIN  SUPP RE) Place 1 Dose rectally daily as needed (constipation).     levocetirizine (XYZAL ) 5 MG tablet Take 1 tablet (5 mg total) by mouth every evening. 30 tablet 0   losartan (COZAAR) 25 MG tablet Take 25 mg by mouth daily.     mometasone  (ELOCON ) 0.1 % cream Apply to affected area daily prn. 30 g 0   Multiple Vitamins-Minerals (CENTRUM SILVER PO) Take 1 tablet by mouth daily.      OneTouch Delica Lancets 33G MISC      ONETOUCH VERIO test strip ONCE DAILY USE TO TEST BLOOD SUGAR ONCE DAILY E11.22 100 strip 4   pantoprazole  (PROTONIX ) 40 MG tablet Take 1 tablet (40 mg total) by mouth daily. 90 tablet 1   polyethylene glycol (MIRALAX  / GLYCOLAX ) packet Take 17 g by mouth daily as needed for moderate constipation.     sitaGLIPtin  (JANUVIA ) 25 MG tablet Take 1 tablet (25 mg total) by mouth daily. 90 tablet 1   spironolactone (ALDACTONE) 25 MG tablet Take 25 mg by mouth once.     torsemide  (DEMADEX ) 20 MG tablet Take 4 tablets (80 mg total) by  mouth daily. 90 tablet 1   White Petrolatum-Mineral Oil (ARTIFICIAL TEARS) ointment Place 1 drop into both eyes daily as needed (Dry eyes).     No current facility-administered medications for this visit.   PHYSICAL EXAM: Vitals:   05/07/24 1512  BP: (!) 116/56  Pulse: 67  SpO2: 100%   Wt Readings from Last 3 Encounters:  05/07/24 221 lb 3.2 oz (100.3 kg)  04/22/24 222 lb (100.7 kg)  03/17/24 229 lb (103.9 kg)    General:  Elderly No resp difficulty HEENT: normal Neck: supple. no JVD. Carotids 2+ bilat; no bruits. No lymphadenopathy or thryomegaly appreciated. Cor: PMI nondisplaced. Regular rate & rhythm. No rubs, gallops or murmurs. Lungs: clear Abdomen: soft, nontender, nondistended. No hepatosplenomegaly.  No bruits or masses. Good bowel sounds. Extremities: no cyanosis, clubbing, rash, edema Neuro: alert & orientedx3, cranial nerves grossly intact. moves all 4 extremities w/o difficulty. Affect pleasant    DATA REVIEW  ECG: 11/07/23: RV paced rhythm  As per my personal interpretation  ECHO: 10/20/23: LVEF 55%-60%  as per my personal interpretation  CATH: HEMODYNAMICS: RA:                  6 mmHg (mean) RV:                  45/1-6 mmHg PA:                  45/15 mmHg (28 mean) PCWP:            13 mmHg (mean)                                      Estimated Fick CO/CI   6.3 L/min, 2.9 L/min/m2                                              TPG                 15  mmHg                                            PVR                 2.4 Wood Units  PAPi                5     ASSESSMENT & PLAN:  Chronic HFpEF - TTE w/ LVEF 60% on 10/20/23 - Unfortunately her severely reduced GFR limits her therapeutic options. Finerenone not indicated for GFR <25 (discontinue if less than 15) - NYHA II - Volume status much improved with switch to torsemide  80 daily - Continue current regimen  2. HTN - Follows BP closely.Well controlled  3. Atrial fibrillation / flutter, paroxysmal - s/p DCCV on 10/17/23 - continue apixaban  2.5mg  daily.  - s/p DCCV in 1/25 - Amiodarone  discontinued by EP on 01/03/24 - Remains in NSR today   4. CKD IV - see above -  eGFR less than 15 - sCr stable at 3.1 on 03/14/24  5. OSA - Compliant with CPAP  6. T2DM - Glipizide  stopped by PCP given hypoglycemia - Januvia  25mg  daily -  A1C on 02/06/24 of 8.7%  Jules Oar, MD  3:51 PM

## 2024-05-08 ENCOUNTER — Other Ambulatory Visit
Admission: RE | Admit: 2024-05-08 | Discharge: 2024-05-08 | Disposition: A | Discharge status: Home / Self Care | Source: Ambulatory Visit | Attending: Internal Medicine | Admitting: Internal Medicine

## 2024-05-08 ENCOUNTER — Ambulatory Visit (HOSPITAL_COMMUNITY): Payer: Self-pay | Admitting: Internal Medicine

## 2024-05-08 DIAGNOSIS — J209 Acute bronchitis, unspecified: Secondary | ICD-10-CM | POA: Diagnosis not present

## 2024-05-08 DIAGNOSIS — I5032 Chronic diastolic (congestive) heart failure: Secondary | ICD-10-CM | POA: Insufficient documentation

## 2024-05-08 LAB — BASIC METABOLIC PANEL WITH GFR
Anion gap: 10 (ref 5–15)
BUN: 78 mg/dL — ABNORMAL HIGH (ref 8–23)
CO2: 26 mmol/L (ref 22–32)
Calcium: 9.5 mg/dL (ref 8.9–10.3)
Chloride: 104 mmol/L (ref 98–111)
Creatinine, Ser: 4.84 mg/dL — ABNORMAL HIGH (ref 0.44–1.00)
GFR, Estimated: 8 mL/min — ABNORMAL LOW (ref >60)
Glucose, Bld: 146 mg/dL — ABNORMAL HIGH (ref 70–99)
Potassium: 4 mmol/L (ref 3.5–5.1)
Sodium: 140 mmol/L (ref 135–145)

## 2024-05-08 LAB — BRAIN NATRIURETIC PEPTIDE: B Natriuretic Peptide: 1435.2 pg/mL — ABNORMAL HIGH (ref 0.0–100.0)

## 2024-05-09 NOTE — Addendum Note (Signed)
 Addended by: Edra Govern D on: 05/09/2024 03:32 PM   Modules accepted: Orders

## 2024-05-09 NOTE — Progress Notes (Signed)
 Remote pacemaker transmission.

## 2024-05-12 ENCOUNTER — Other Ambulatory Visit: Payer: Self-pay

## 2024-05-12 DIAGNOSIS — R0602 Shortness of breath: Secondary | ICD-10-CM

## 2024-05-13 ENCOUNTER — Telehealth: Payer: Self-pay

## 2024-05-13 ENCOUNTER — Ambulatory Visit (INDEPENDENT_AMBULATORY_CARE_PROVIDER_SITE_OTHER): Admitting: Pulmonary Disease

## 2024-05-13 DIAGNOSIS — I5032 Chronic diastolic (congestive) heart failure: Secondary | ICD-10-CM

## 2024-05-13 DIAGNOSIS — R0602 Shortness of breath: Secondary | ICD-10-CM

## 2024-05-13 LAB — PULMONARY FUNCTION TEST
DL/VA % pred: 103 %
DL/VA: 4.24 ml/min/mmHg/L
DLCO unc % pred: 67 %
DLCO unc: 12.19 ml/min/mmHg
FEF 25-75 Post: 1.51 L/s
FEF 25-75 Pre: 1.63 L/s
FEF2575-%Change-Post: -7 %
FEF2575-%Pred-Post: 137 %
FEF2575-%Pred-Pre: 148 %
FEV1-%Change-Post: -1 %
FEV1-%Pred-Post: 83 %
FEV1-%Pred-Pre: 84 %
FEV1-Post: 1.44 L
FEV1-Pre: 1.45 L
FEV1FVC-%Change-Post: 2 %
FEV1FVC-%Pred-Pre: 112 %
FEV6-%Change-Post: -3 %
FEV6-%Pred-Post: 78 %
FEV6-%Pred-Pre: 80 %
FEV6-Post: 1.72 L
FEV6-Pre: 1.78 L
FEV6FVC-%Pred-Post: 106 %
FEV6FVC-%Pred-Pre: 106 %
FVC-%Change-Post: -3 %
FVC-%Pred-Post: 73 %
FVC-%Pred-Pre: 75 %
FVC-Post: 1.72 L
FVC-Pre: 1.78 L
Post FEV1/FVC ratio: 84 %
Post FEV6/FVC ratio: 100 %
Pre FEV1/FVC ratio: 82 %
Pre FEV6/FVC Ratio: 100 %
RV % pred: 83 %
RV: 2.1 L
TLC % pred: 73 %
TLC: 3.71 L

## 2024-05-13 MED ORDER — TORSEMIDE 20 MG PO TABS: 60.0000 mg | ORAL_TABLET | Freq: Every day | ORAL | Status: DC

## 2024-05-13 NOTE — Telephone Encounter (Signed)
-----   Message from Nurse Emer C sent at 05/09/2024 12:14 PM EDT -----  ----- Message ----- From: Mardell Shade, MD Sent: 05/08/2024   1:19 PM EDT To: [facility] Triage Pool  SCr up significantly. Hold torsemide  2 days and decrease to 60 daily. Repeat BMET 1 week.   Please forward to her kideny doctor

## 2024-05-13 NOTE — Addendum Note (Signed)
 Addended by: Autry Legions A on: 05/13/2024 04:36 PM   Modules accepted: Orders

## 2024-05-13 NOTE — Telephone Encounter (Signed)
 Spoke to pt. Pt agreeable to medication changes. Pt getting labs at pcp including bmet. Called pt's pcp and they are agreeable to send us  the lab results. Will follow up with [facility].

## 2024-05-13 NOTE — Telephone Encounter (Signed)
 Called pt to discuss lab work. Lvm to return call.

## 2024-05-13 NOTE — Patient Instructions (Signed)
 Full PFT performed today.

## 2024-05-13 NOTE — Progress Notes (Signed)
 Full PFT performed today.

## 2024-05-13 NOTE — Telephone Encounter (Signed)
 Copied from CRM [id number]. Topic: Clinical - Lab/Test Results >> May 13, 2024  1:47 PM Adonis Hoot wrote: Reason for CRM: Heart failure clinic Dr Julane Ny would like to know if lab results from labs that patient will be getting drawn on 05/23/2024 could be sent to them when they are ready?  She would like to know if they could just be routed to Dr Julane Ny

## 2024-05-14 ENCOUNTER — Ambulatory Visit: Payer: PPO | Attending: Cardiology | Admitting: Cardiology

## 2024-05-14 ENCOUNTER — Encounter: Payer: Self-pay | Admitting: Cardiology

## 2024-05-14 VITALS — BP 120/64 | HR 68 | Ht 64.0 in | Wt 218.4 lb

## 2024-05-14 DIAGNOSIS — I480 Paroxysmal atrial fibrillation: Secondary | ICD-10-CM

## 2024-05-14 DIAGNOSIS — I428 Other cardiomyopathies: Secondary | ICD-10-CM | POA: Diagnosis not present

## 2024-05-14 DIAGNOSIS — E782 Mixed hyperlipidemia: Secondary | ICD-10-CM

## 2024-05-14 DIAGNOSIS — Z950 Presence of cardiac pacemaker: Secondary | ICD-10-CM | POA: Diagnosis not present

## 2024-05-14 DIAGNOSIS — I5032 Chronic diastolic (congestive) heart failure: Secondary | ICD-10-CM | POA: Diagnosis not present

## 2024-05-14 DIAGNOSIS — N184 Chronic kidney disease, stage 4 (severe): Secondary | ICD-10-CM | POA: Diagnosis not present

## 2024-05-14 DIAGNOSIS — E119 Type 2 diabetes mellitus without complications: Secondary | ICD-10-CM

## 2024-05-14 DIAGNOSIS — I10 Essential (primary) hypertension: Secondary | ICD-10-CM | POA: Diagnosis not present

## 2024-05-14 NOTE — Patient Instructions (Signed)
 Medication Instructions:  Your physician recommends that you continue on your current medications as directed. Please refer to the Current Medication list given to you today.  *If you need a refill on your cardiac medications before your next appointment, please call your pharmacy*  Lab Work: None ordered      If you have labs (blood work) drawn today and your tests are completely normal, you will receive your results only by: MyChart Message (if you have MyChart) OR A paper copy in the mail If you have any lab test that is abnormal or we need to change your treatment, we will call you to review the results.  Testing/Procedures: None ordered    Follow-Up: At [facility], you and your health needs are our priority.  As part of our continuing mission to provide you with exceptional heart care, our providers are all part of one team.  This team includes your primary Cardiologist (physician) and Advanced Practice Providers or APPs (Physician Assistants and Nurse Practitioners) who all work together to provide you with the care you need, when you need it.  Your next appointment:   4 month(s)  Provider:   Timothy Gollan, MD   We recommend signing up for the patient portal called MyChart.  Sign up information is provided on this After Visit Summary.  MyChart is used to connect with patients for Virtual Visits (Telemedicine).  Patients are able to view lab/test results, encounter notes, upcoming appointments, etc.  Non-urgent messages can be sent to your provider as well.   To learn more about what you can do with MyChart, go to [email or web address].

## 2024-05-14 NOTE — Progress Notes (Signed)
 Cardiology Office Note   Date:  05/14/2024  ID:  Pilar Bridge, DOB [birth year], MRN [id number] PCP: Dellar Fenton, MD  [facility] Providers Cardiologist:  Belva Boyden, MD Electrophysiologist:  Boyce Byes, MD     History of Present Illness [name] [name] is a 89 y.o. female with a past medical history of paroxysmal atrial fibrillation on chronic anticoagulation, chronic chest pain with normal coronaries on remote cath, hypertension, hyperlipidemia, type 2 diabetes, CKD stage IV, chronic HFpEF, pulmonary hypertension, moderate MR/TR, symptomatic PVCs, OSA on CPAP, sick sinus syndrome status post MDT PPM, who is here today for follow-up.   Myoview  stress testing completed in October 2020 showed no significant ischemia with normal ejection fraction. Echocardiogram completed May 2022 showed normal LV SF, moderate TR, mild to moderate MR, mild PR, and EF of 55 to 60%.  05/21/2023 her device interrogation was completed by EP at that time he was doing well she continued to have shortness of breath she was scheduled for PFTs.  Echocardiogram completed 04/2023 revealed LVEF of 55 to 60%, no RWMA, moderate mitral valve regurgitation, moderate tricuspid regurgitation.  She continued to have complaints of shortness of breath, fatigue, peripheral edema without weight gain.  Stated that her symptoms have been ongoing since March 2024.  She had been ordered PFTs through pulmonary sleep and concerned about her antiarrhythmic causing her shortness of breath.  PFTs were reviewed and showed severely reduced DLCO out of proportion to the degree of restriction which raises a concern for pulmonary vascular disease likely group 2 in the setting of her moderate mitral valve regurgitation and group 3 in the setting of OSA.  Findings were considered consistent with patients with combined restrictive and obstructive pulmonary disease which was thought to be less likely.  She was scheduled for CT of the chest to  rule out amiodarone  toxicity and was advised that her CT results did not show any signs of amiodarone  pulmonary toxicity she was scheduled for thyroid  function studies.She was evaluated in clinic 08/17/2023 with continued shortness of breath was noted to have extremely prolonged QT interval.  EP was consulted during her visit and the patient's PR interval was shortened on her device and follow-up was made with a PE.  She was last seen in clinic 10/08/23 with Dr. Gollan.  At that point she was feeling her heart beating in her ears, extreme fatigue, shortness of breath with little to no activity, bilateral lower extremity edema, and chest heaviness that radiates up into her neck happens with exertion.  At that time she was recommended for cardioversion as she had converted into atrial flutter approximately 37 days ago persistent with worsening symptoms.  Cardioversion was planned for 10/17/2023.  She was restored to normal sinus rhythm with AV pacing.  She presented to [facility] emergency department on 10/19/2023 with worsening dyspnea and associated midsternal chest pain felt chest pressure described 10 out of 10 in severity.  She admits to a dry cough and wheezing.  She admits to orthopnea and paroxysmal nocturnal dyspnea as well as worsening lower extremity edema.  She had a new O2 requirement likely related to her CHF and community-acquired pneumonia.  She was started on diuresis and antibiotics.  Antibiotics were discontinued as it did not feel there was strong clinical suspicion for Repeat echocardiogram was ordered.  She continued to be diuresed with lung sounds improving but her creatinine was worse so diuretics had to be held.  Echocardiogram revealed an LVEF of 55 to 60%,  normal LV function and no RWMA.  Severely elevated pulmonary artery pressure.  On 10/21/2023 she went back into atrial flutter overnight EP was consulted for evaluation as well as advanced heart failure as she still had significant dyspnea on  exertion.  She was scheduled for right heart catheterization to better understand hemodynamics and her symptoms of shortness of breath.  She underwent right heart catheterization on 10/23/2023 which revealed normal pre and postcapillary filling pressure, mildly elevated PA mean PVR likely secondary to HFpEF, normal cardiac output/index.  Considered stable was able to be discharged on 10/23/2023 with continued follow-up with EP, cardiology, and advanced heart failure.   She was seen in clinic 12//20 for continued on shortness of breath that felt to be slowly improving.  She been compliant with her current medication regimen with no changes that were made.   She followed up with advanced heart failure 11/19/2023.  Reported that her dyspnea was much improved in the past 4 days and she started to have weight gain.  She related that she had gained 3 to 4 pounds in the last 24 hours and was now started to become short of breath when lying flat at the time of her appointment.  Lasix  was increased to 60 mg in the morning 40 mg at night for 4 days then 60 mg daily she was to follow-up with advanced heart failure clinic in a week to reassess.  With her GFR less than 15 and serum creatinine 3.14 there was no addition of SGLT2 inhibitor and she was not a candidate for spironolactone.  On return would consider an finerenone.  Also discussed possible repeat DCCV.  She followed up with Dr. Alease Amend 11/29/2023.  She reported some symptomatic improvement since her last visit on the increased dose of diuretics.  Her weight was down about 5 pounds since the visit.  She does note continued shortness of breath with exertion whenever she had to exert herself more than 5 to 10 minutes.  At her appointment discussed amiodarone  therapy as well as potential retrial of cardioversion patient decided she would like to hold off at that time.  She was then seen in clinic 12/06/2023 by EP.  Feeling slightly better no shortness of breath  standpoint.  Continued fatigue most mornings.  She has been compliant with her medication regimen with no changes.  She was tolerating amiodarone  well and she was continued on 200 mg daily.  High A-fib burden was noted via device discussed repeat elective cardioversion procedure.  She underwent electrical cardioversion on 12/13/2023 with with being cardioverted at 150 J.  Normal sinus rhythm was achieved.  No immediate postprocedure complications were noted.  She was evaluated in clinic by EP on 01/03/2024.  Unfortunately she was no longer sinus or atrial fibrillation she was a flutter that was considered now permanent.  Amiodarone  was no longer effective in maintaining sinus rhythm after repeated DCCV.  Discussed rate control strategy with patient and permanent atrial fibrillation amiodarone  was stopped.  She is device dependent so beta-blocker was not adjusted she was continued on 25 mg of Coreg  in the a.m. and 50 mg of Coreg  in the p.m.  She was also continued on 80 mg of Lasix  daily and encouraged to keep a close follow-up with advanced heart failure.   She was last seen in clinic 05/07/2024 by Dr. Julane Ny at advanced heart failure clinic.  At her last heart failure visit she was switched from furosemide  to torsemide .  Fluid and breathing much improved.  She had lost approximately 18 pounds in 2 months.  She been compliant with her meds and CPAP.  Does all of her ADLs without a problem continues to go to the lab proximately 3 days a week for exercise.  Her main issue the day of her appointment was cough and a runny nose that been going on for several weeks.  She had noted no bleeding on apixaban .  With her severely reduced GFR finerenone was not indicated.  She continues to from NYHA class II symptoms and was continued on torsemide  80 mg daily.  She returns to clinic today stating that she has been doing well from the cardiac perspective.  Her shortness of breath has improved, her weight is stable, there is  no swelling, she states that she has been compliant with her blood thinner although she has noted occasional nosebleeds but no blood noted in her urine or stool.  She stated that her fluid pill was recently increased and she had blood work and was advised that her kidney function was worse so then her torsemide  was decreased back to 60 mg daily after she held it for 2 days and then was to have repeat blood work completed.  She states that she has had no further side effects from her medications.  She states that she did not follow-up with urgent care for sinus infection and received antibiotics.  But denies any hospitalizations or visits to the emergency department.  ROS: 10 point review of system has been reviewed and considered negative exception was been listed in the HPI  Studies Reviewed     RHC 10/23/2023 IMPRESSION: Normal pre and post capillary filling pressures.  Mildly elevated PA mean and PVR likely secondary to HFpEF Normal cardiac output / index.    2D echo 10/20/2023 1. Left ventricular ejection fraction, by estimation, is 55 to 60%. The  left ventricle has normal function. The left ventricle has no regional  wall motion abnormalities. Left ventricular diastolic parameters are  indeterminate.   2. Right ventricular systolic function is low normal. The right  ventricular size is mildly enlarged. There is severely elevated pulmonary  artery systolic pressure. The estimated right ventricular systolic  pressure is 62.8 mmHg.   3. Left atrial size was mildly dilated.   4. The mitral valve is normal in structure. No evidence of mitral valve  regurgitation. No evidence of mitral stenosis.   5. Tricuspid valve regurgitation is moderate.   6. The aortic valve is normal in structure. Aortic valve regurgitation is  not visualized. No aortic stenosis is present.   7. The inferior vena cava is normal in size with greater than 50%  respiratory variability, suggesting right atrial pressure  of 3 mmHg.    TTE 04/25/23 1. Left ventricular ejection fraction, by estimation, is 55 to 60%. The  left ventricle has normal function. The left ventricle has no regional  wall motion abnormalities. Left ventricular diastolic parameters were  normal.   2. Right ventricular systolic function is normal. The right ventricular  size is normal. There is mildly elevated pulmonary artery systolic  pressure. The estimated right ventricular systolic pressure is 44.7 mmHg.   3. Left atrial size was moderately dilated.   4. The mitral valve is normal in structure. Moderate mitral valve  regurgitation. No evidence of mitral stenosis.   5. Tricuspid valve regurgitation is moderate.   6. The aortic valve is tricuspid. Aortic valve regurgitation is not  visualized. No aortic stenosis is present.  7. The inferior vena cava is normal in size with greater than 50%  respiratory variability, suggesting right atrial pressure of 3 mmHg.    TTE 12/18/18 Left ventricle: The cavity size was normal. Wall thickness was    increased in a pattern of mild LVH. Systolic function was normal.    The estimated ejection fraction was in the range of 55% to 65%.  - Mitral valve: There was mild regurgitation.  - Left atrium: The atrium was mildly dilated.  Risk Assessment/Calculations  CHA2DS2-VASc Score = 6   This indicates a 9.7% annual risk of stroke. The patient's score is based upon: CHF History: 1 HTN History: 1 Diabetes History: 1 Stroke History: 0 Vascular Disease History: 0 Age Score: 2 Gender Score: 1            Physical Exam VS:  BP 120/64 (BP Location: Left Arm, Patient Position: Sitting, Cuff Size: Large)   Pulse 68   Ht 5' 4 (1.626 m)   Wt 218 lb 6 oz (99.1 kg)   SpO2 98%   BMI 37.48 kg/m    Wt Readings from Last 3 Encounters:  05/14/24 218 lb 6 oz (99.1 kg)  05/13/24 218 lb 12.8 oz (99.2 kg)  05/07/24 221 lb 3.2 oz (100.3 kg)    GEN: Well nourished, well developed in no acute  distress NECK: No JVD; No carotid bruits CARDIAC: RRR, no murmurs, rubs, gallops RESPIRATORY:  Clear to auscultation without rales, wheezing or rhonchi  ABDOMEN: Soft, non-tender, non-distended EXTREMITIES:  No edema; No deformity, compression stockings on bilateral  ASSESSMENT AND PLAN Chronic HFpEF/NICM with last LVEF 55 to 60%, no RWMA, severely elevated pulmonary systolic pressures, moderate tricuspid regurgitation with right heart catheterization revealing mildly elevated PA mean.  Shortness of breath has improved with increasing diuretic therapy.  Weight is down.  She appears to be euvolemic on exam and is suffering from NYHA class I-II symptoms.  She is continue to follow with advanced heart failure.  She is continued on the carvedilol  12.5 mg twice daily, losartan 25 mg daily, spironolactone 25 mg daily, and torsemide  60 mg daily.  She has been encouraged to continue to follow-up with advanced heart failure as well as maintaining her daily weights.  Paroxysmal atrial fibrillation/atrial flutter.  She did undergo cardioversion procedure x 2.  She is continued on apixaban  2.5 mg twice daily as she remains meets reduced dosing criteria for age and kidney function, for stroke prophylaxis she is also continued on carvedilol  12.5 mg twice daily.  Previously amiodarone  was discontinued by EP.  She has been encouraged to keep all appointments with the EP.  Primary hypertension with blood pressure today 120/64.  Blood pressure has been well-controlled.  She is continued on carvedilol  12.5 mg twice daily, losartan 25 mg daily spironolactone 25 mg daily and torsemide  60 mg daily.  She has been encouraged to continue to monitor her blood pressures 1 to 2 hours postmedication administration at home as well.  Mixed hyperlipidemia with last LDL of 96 she is continued on 20 mg of atorvastatin  with ongoing management per PCP.  Chronic kidney disease stage IV with the last serum creatinine of 4.84 with a GFR  of 8.  Baseline serum creatinine 2.5-2.7.  Diuretics have recently been inhaled with elevation in serum creatinine and reduced dosing will resume.  She also has updated lab work scheduled.  She has been encouraged to continue to follow with nephrology and avoid all NSAIDs.  Type 2 diabetes where she  is on Januvia  with ongoing management per PCP.  Cardiac pacemaker in situ with a history of sick sinus syndrome and tachybradycardia syndrome.  She is ventricular paced on her last EKG.  Will continue to follow with EP for adjustments in remote monitoring.       Dispo: Patient return to clinic to see MD/APP in 4 months or sooner if needed for reevaluation of symptoms.  Signed, Leshon Armistead, NP

## 2024-05-15 ENCOUNTER — Ambulatory Visit: Admitting: Podiatry

## 2024-05-15 DIAGNOSIS — Z91198 Patient's noncompliance with other medical treatment and regimen for other reason: Secondary | ICD-10-CM

## 2024-05-15 NOTE — Progress Notes (Signed)
 1. Failure to attend appointment with reason given    Showed up past 15 minute grace window. Rescheduled.

## 2024-05-20 ENCOUNTER — Ambulatory Visit (INDEPENDENT_AMBULATORY_CARE_PROVIDER_SITE_OTHER): Admitting: Pulmonary Disease

## 2024-05-20 ENCOUNTER — Encounter: Payer: Self-pay | Admitting: Pulmonary Disease

## 2024-05-20 VITALS — BP 112/68 | HR 85 | Ht 64.0 in | Wt 218.0 lb

## 2024-05-20 DIAGNOSIS — I5032 Chronic diastolic (congestive) heart failure: Secondary | ICD-10-CM

## 2024-05-20 NOTE — Progress Notes (Signed)
 Synopsis: Referred in by Dellar Fenton, MD   Subjective:   PATIENT ID: [name] [name] GENDER: female DOB: 1935/05/21, MRN: [id number]  Chief Complaint  Patient presents with   Shortness of Breath   Congestive Heart Failure    HPI [name] [name] is an 89 year old female patient with a past medical history of anemia of chronic disease, first-degree AV block status post pacemaker, A-fib on Coumadin , CKD stage IV, type 2 diabetes mellitus, OSA on CPAP.  Presenting to the pulmonary clinic for evaluation of shortness of breath and abnormal PFTs.  She reports shortness of breath and fatigue since March of this year.  Describes it mostly on exertion even turning over in bed will make her gasp for air.  She denies any cough or sputum production.  She denies any chest pain.  She denies any wheezing or chest tightness.  She denies any loss of appetite or any weight loss.  She does have a history of allergic rhinitis and has a history of OSA on CPAP.  She underwent an echocardiogram as outpatient in May of this year that showed an EF of 55 to 60%, moderate mitral valve regurgitation, mildly elevated pulmonary arterial pressure and moderate tricuspid regurgitation.  Which was not changed from prior studies.    She also underwent PFTs that showed mild restriction with decreased TLC at 76% of predicted.  Mild restrictive pattern on spirometry without obstruction however severely reduced DLCO of 22% of predicted not corrected to hemoglobin. HRCT did not show any signs of Amiodarone  pulmonary toxicity.   Hospitalized from 10/19/2023 - 10/23/2023 for chf exacerbation. RHC PA 45/15 (28) PCWP 13. PVR 2.4 W.U. TPG 15 and DPG 2.  Family history -she denies any family history of lung disease.  Social history -she smoked half pack per day for 20 years quit smoking in 1990.  She denies alcohol use worked in [employer].  Has 2 sons no pets at home.  OV 05/20/2024 -  [name] [name] is here to follow up on her PFTs  results. She feels much better generally. Her breathing has improved significantly with volume optimization. Her repeat PFTs 05/2024 shows significant improvement in her DLCO.   ROS All systems were reviewed and are negative except for the above.  Objective:   Vitals:   05/20/24 1058  BP: 112/68  Pulse: 85  SpO2: 98%  Weight: 218 lb (98.9 kg)  Height: 5' 4 (1.626 m)    98% on RA BMI Readings from Last 3 Encounters:  05/20/24 37.42 kg/m  05/14/24 37.48 kg/m  05/13/24 37.56 kg/m   Wt Readings from Last 3 Encounters:  05/20/24 218 lb (98.9 kg)  05/14/24 218 lb 6 oz (99.1 kg)  05/13/24 218 lb 12.8 oz (99.2 kg)    Physical Exam GEN: NAD, Obese HEENT: Supple Neck, Reactive Pupils, EOMI  CVS: irregularly irregular rhythm, normal rate, systolic murmur heard at the mitral site Lungs: Clear bilateral air entry.  Abdomen: Soft, non tender, non distended, + BS  Extremities: Warm and well perfused, +1 LE edema Skin: No suspicious lesions appreciated  Psych: Normal Affect  Ancillary Information   CBC    Component Value Date/Time   WBC 5.9 12/28/2023 1018   WBC 6.6 10/22/2023 0518   RBC 3.95 12/28/2023 1018   HGB 10.4 (L) 12/28/2023 1018   HGB 10.5 (L) 12/07/2023 1022   HCT 30.8 (L) 12/28/2023 1018   HCT 33.4 (L) 12/07/2023 1022   PLT 128 (L) 12/28/2023 1018   PLT 141 (  L) 12/07/2023 1022   MCV 78.0 (L) 12/28/2023 1018   MCV 85 12/07/2023 1022   MCV 70 (L) 08/03/2014 1525   MCH 26.3 12/28/2023 1018   MCHC 33.8 12/28/2023 1018   RDW 15.6 (H) 12/28/2023 1018   RDW 15.9 (H) 12/07/2023 1022   RDW 16.6 (H) 08/03/2014 1525   LYMPHSABS 1.3 10/19/2023 0048   LYMPHSABS 2.0 11/24/2013 1057   LYMPHSABS 2.2 09/24/2013 0556   MONOABS 0.5 10/19/2023 0048   MONOABS 0.7 09/24/2013 0556   EOSABS 0.1 10/19/2023 0048   EOSABS 0.2 11/24/2013 1057   EOSABS 0.2 09/24/2013 0556   BASOSABS 0.0 10/19/2023 0048   BASOSABS 0.0 11/24/2013 1057   BASOSABS 0.0 09/24/2013 0556    Imaging   CT soft tissue neck in 2020: Shows the apices of fluid in the lung without any concerning findings.  Also showed patulous esophagus.  Echocardiogram May 2024: LVEF 55 to 60% with normal diastolic parameters.  RV systolic function is normal.  Mildly elevated pulmonary artery systolic pressure measuring at 45 mmHg.  Mean moderate mitral valve regurgitation.  PFTs July 2024 FVC 1.74l (LLM 1.69) 72% of Predicted, FEV-1 1.49L (LLN 1.18) 84%of predicted. FEV-1 to FVC ratio 86. TLC 3.86 (LLN 3.98) 76% of predicted. DLCO 22% of predicted not corrected to patients hemoglobin.      Latest Ref Rng & Units 05/13/2024    2:38 PM 06/12/2023    1:32 PM  PFT Results  FVC-Pre L 1.78  1.74   FVC-Predicted Pre % 75  72   FVC-Post L 1.72  1.75   FVC-Predicted Post % 73  73   Pre FEV1/FVC % % 82  86   Post FEV1/FCV % % 84  87   FEV1-Pre L 1.45  1.49   FEV1-Predicted Pre % 84  84   FEV1-Post L 1.44  1.52   DLCO uncorrected ml/min/mmHg 12.19  4.21   DLCO UNC% % 67  22   DLVA Predicted % 103  71   TLC L 3.71  3.87   TLC % Predicted % 73  76   RV % Predicted % 83  73      Assessment & Plan:  [name] is an 89 year old female patient with a past medical history of anemia of chronic disease, first-degree AV block status post pacemaker, A-fib on Coumadin , CKD stage IV, type 2 diabetes mellitus, OSA on CPAP.  Presenting to the pulmonary clinic for evaluation of shortness of breath and abnormal PFTs.  #Dyspnea on exertion  Likely secondary to her HFpEF and Gp II and III PH. DLCO was 22 on her last PFTs HRCT without any sings of ILD. RHC with mildly elevated PVR. Mean PA 28. DPG of 2 is suggestive of isolated post cap PH. There is some degree of remodelling and possibly a compnent of gp III due to OSA.   Mainstay of treatment will be optimization of volume status and treatment of her underlying OSA.   []  C/w CPAP for OSA.  []  Continue with volume optimization per cardiology (Dr. Gollan).  []  Advised on  importance of weight loss.  #OSA compliant with CPAP.   #Paroxysmal A.fib on coumadin   #Moderate mitral valve regurgitation  #1st degree AV block s/p pacemaker  #Hypertension Currently on Coumadin  with therapeutic INR. On Amiodarone  200mg  PO daily  On Lasix  40 mg p.o. daily.  On carvedilol  25 mg in the morning and 50 mg at night. On Amlodipine  5mg  daily.   #Type II DM  -  On Glipizide  and Sitagliptin .   RTC 6 months .  I spent 20 minutes caring for this patient today, including preparing to see the patient, obtaining a medical history , reviewing a separately obtained history, performing a medically appropriate examination and/or evaluation, counseling and educating the patient/family/caregiver, ordering medications, tests, or procedures, documenting clinical information in the electronic health record, and independently interpreting results (not separately reported/billed) and communicating results to the patient/family/caregiver  Annitta Kindler, MD [facility] 05/20/2024 11:15 AM

## 2024-05-21 DIAGNOSIS — I10 Essential (primary) hypertension: Secondary | ICD-10-CM | POA: Diagnosis not present

## 2024-05-21 DIAGNOSIS — I509 Heart failure, unspecified: Secondary | ICD-10-CM | POA: Diagnosis not present

## 2024-05-21 DIAGNOSIS — E785 Hyperlipidemia, unspecified: Secondary | ICD-10-CM | POA: Diagnosis not present

## 2024-05-21 DIAGNOSIS — I480 Paroxysmal atrial fibrillation: Secondary | ICD-10-CM | POA: Diagnosis not present

## 2024-05-21 DIAGNOSIS — R600 Localized edema: Secondary | ICD-10-CM | POA: Diagnosis not present

## 2024-05-21 DIAGNOSIS — K50919 Crohn's disease, unspecified, with unspecified complications: Secondary | ICD-10-CM | POA: Diagnosis not present

## 2024-05-21 DIAGNOSIS — I129 Hypertensive chronic kidney disease with stage 1 through stage 4 chronic kidney disease, or unspecified chronic kidney disease: Secondary | ICD-10-CM | POA: Diagnosis not present

## 2024-05-21 DIAGNOSIS — E1122 Type 2 diabetes mellitus with diabetic chronic kidney disease: Secondary | ICD-10-CM | POA: Diagnosis not present

## 2024-05-21 DIAGNOSIS — K219 Gastro-esophageal reflux disease without esophagitis: Secondary | ICD-10-CM | POA: Diagnosis not present

## 2024-05-21 DIAGNOSIS — N184 Chronic kidney disease, stage 4 (severe): Secondary | ICD-10-CM | POA: Diagnosis not present

## 2024-05-21 DIAGNOSIS — N2581 Secondary hyperparathyroidism of renal origin: Secondary | ICD-10-CM | POA: Diagnosis not present

## 2024-05-21 DIAGNOSIS — D631 Anemia in chronic kidney disease: Secondary | ICD-10-CM | POA: Diagnosis not present

## 2024-05-22 ENCOUNTER — Ambulatory Visit: Payer: PPO | Admitting: Pulmonary Disease

## 2024-05-22 NOTE — Telephone Encounter (Signed)
 Labs will be done 6/20. Will forward to cardio once resulted.

## 2024-05-23 ENCOUNTER — Other Ambulatory Visit

## 2024-05-23 ENCOUNTER — Telehealth: Payer: Self-pay

## 2024-05-23 DIAGNOSIS — Z7984 Long term (current) use of oral hypoglycemic drugs: Secondary | ICD-10-CM | POA: Diagnosis not present

## 2024-05-23 DIAGNOSIS — I5032 Chronic diastolic (congestive) heart failure: Secondary | ICD-10-CM

## 2024-05-23 DIAGNOSIS — E119 Type 2 diabetes mellitus without complications: Secondary | ICD-10-CM | POA: Diagnosis not present

## 2024-05-23 DIAGNOSIS — E782 Mixed hyperlipidemia: Secondary | ICD-10-CM

## 2024-05-23 LAB — HEPATIC FUNCTION PANEL
ALT: 15 U/L (ref 0–35)
AST: 19 U/L (ref 0–37)
Albumin: 4.2 g/dL (ref 3.5–5.2)
Alkaline Phosphatase: 64 U/L (ref 39–117)
Bilirubin, Direct: 0.3 mg/dL (ref 0.0–0.3)
Total Bilirubin: 1 mg/dL (ref 0.2–1.2)
Total Protein: 7.2 g/dL (ref 6.0–8.3)

## 2024-05-23 LAB — BASIC METABOLIC PANEL WITH GFR
BUN: 77 mg/dL — ABNORMAL HIGH (ref 6–23)
CO2: 29 meq/L (ref 19–32)
Calcium: 10 mg/dL (ref 8.4–10.5)
Chloride: 97 meq/L (ref 96–112)
Creatinine, Ser: 5.53 mg/dL (ref 0.40–1.20)
GFR: 6.55 mL/min — CL (ref >60.00)
Glucose, Bld: 158 mg/dL — ABNORMAL HIGH (ref 70–99)
Potassium: 4.5 meq/L (ref 3.5–5.1)
Sodium: 137 meq/L (ref 135–145)

## 2024-05-23 LAB — LIPID PANEL
Cholesterol: 136 mg/dL (ref 0–200)
HDL: 42.9 mg/dL (ref >39.00)
LDL Cholesterol: 77 mg/dL (ref 0–99)
NonHDL: 93.36
Total CHOL/HDL Ratio: 3
Triglycerides: 83 mg/dL (ref 0.0–149.0)
VLDL: 16.6 mg/dL (ref 0.0–40.0)

## 2024-05-23 LAB — HEMOGLOBIN A1C: Hgb A1c MFr Bld: 7.5 % — ABNORMAL HIGH (ref 4.6–6.5)

## 2024-05-23 NOTE — Telephone Encounter (Signed)
 Left message to return call to our office.

## 2024-05-23 NOTE — Telephone Encounter (Signed)
 Sending message to Bluford Burkitt, NP covering for Dr. Geralyn Knee  CRITICAL VALUE STICKER  CRITICAL VALUE: Creatinine 5.53; GFR 6.55  RECEIVER (on-site recipient of call): Odilia Bennett, RN  DATE & TIME NOTIFIED: 05/23/24 12:13p  MESSENGER (representative from lab): Saa  MD NOTIFIED: Bluford Burkitt, NP  TIME OF NOTIFICATION: 12:16p

## 2024-05-25 ENCOUNTER — Ambulatory Visit: Payer: Self-pay | Admitting: Internal Medicine

## 2024-05-25 NOTE — Telephone Encounter (Signed)
 Reviewed. Sees nephrology. GFR has been low. Need to forward labs to nephrology and need to confirm she has schedule f/u with neprhology.

## 2024-05-26 NOTE — Telephone Encounter (Signed)
 Has f/u with nephrology 6/25. Labs have been forwarded.

## 2024-05-26 NOTE — Telephone Encounter (Signed)
 Labs forwarded to Dr Bensimhon

## 2024-05-27 ENCOUNTER — Other Ambulatory Visit: Payer: Self-pay | Admitting: *Deleted

## 2024-05-27 ENCOUNTER — Ambulatory Visit (INDEPENDENT_AMBULATORY_CARE_PROVIDER_SITE_OTHER): Admitting: Internal Medicine

## 2024-05-27 ENCOUNTER — Encounter: Payer: Self-pay | Admitting: Internal Medicine

## 2024-05-27 VITALS — BP 122/78 | HR 76 | Temp 97.8°F | Ht 64.0 in | Wt 214.4 lb

## 2024-05-27 DIAGNOSIS — D631 Anemia in chronic kidney disease: Secondary | ICD-10-CM | POA: Diagnosis not present

## 2024-05-27 DIAGNOSIS — G4733 Obstructive sleep apnea (adult) (pediatric): Secondary | ICD-10-CM | POA: Diagnosis not present

## 2024-05-27 DIAGNOSIS — N185 Chronic kidney disease, stage 5: Secondary | ICD-10-CM | POA: Diagnosis not present

## 2024-05-27 DIAGNOSIS — N184 Chronic kidney disease, stage 4 (severe): Secondary | ICD-10-CM | POA: Diagnosis not present

## 2024-05-27 DIAGNOSIS — I480 Paroxysmal atrial fibrillation: Secondary | ICD-10-CM | POA: Diagnosis not present

## 2024-05-27 DIAGNOSIS — E042 Nontoxic multinodular goiter: Secondary | ICD-10-CM | POA: Diagnosis not present

## 2024-05-27 DIAGNOSIS — E119 Type 2 diabetes mellitus without complications: Secondary | ICD-10-CM

## 2024-05-27 DIAGNOSIS — I10 Essential (primary) hypertension: Secondary | ICD-10-CM | POA: Diagnosis not present

## 2024-05-27 DIAGNOSIS — H9202 Otalgia, left ear: Secondary | ICD-10-CM | POA: Diagnosis not present

## 2024-05-27 DIAGNOSIS — I5032 Chronic diastolic (congestive) heart failure: Secondary | ICD-10-CM | POA: Diagnosis not present

## 2024-05-27 DIAGNOSIS — E785 Hyperlipidemia, unspecified: Secondary | ICD-10-CM | POA: Diagnosis not present

## 2024-05-27 DIAGNOSIS — N2581 Secondary hyperparathyroidism of renal origin: Secondary | ICD-10-CM

## 2024-05-27 DIAGNOSIS — Z7984 Long term (current) use of oral hypoglycemic drugs: Secondary | ICD-10-CM | POA: Diagnosis not present

## 2024-05-27 DIAGNOSIS — I272 Pulmonary hypertension, unspecified: Secondary | ICD-10-CM

## 2024-05-27 DIAGNOSIS — E1122 Type 2 diabetes mellitus with diabetic chronic kidney disease: Secondary | ICD-10-CM

## 2024-05-27 DIAGNOSIS — R0602 Shortness of breath: Secondary | ICD-10-CM

## 2024-05-27 DIAGNOSIS — E782 Mixed hyperlipidemia: Secondary | ICD-10-CM

## 2024-05-27 MED ORDER — APIXABAN 2.5 MG PO TABS: 2.5000 mg | ORAL_TABLET | Freq: Two times a day (BID) | ORAL | 5 refills | Status: AC

## 2024-05-27 NOTE — Assessment & Plan Note (Signed)
 She underwent an echocardiogram as outpatient in May of this year that showed an EF of 55 to 60%, moderate mitral valve regurgitation, mildly elevated pulmonary arterial pressure and moderate tricuspid regurgitation.  Which was not changed from prior studies. She also underwent PFTs that showed mild restriction with decreased TLC at 76% of predicted.  Mild restrictive pattern on spirometry without obstruction however severely reduced DLCO of 22% of predicted not corrected to hemoglobin. HRCT did not show any signs of Amiodarone  pulmonary toxicity.

## 2024-05-27 NOTE — Assessment & Plan Note (Addendum)
 Has been seeing Dr Cherilyn for her diabetes and f/u multinodular goiter. She was previously having some intermittent low sugars.  She restarted glipizide .  We discussed remaining off this medication, given the low sugars and her renal function. She did have pancreatitis in the past, so we had discussed avoiding GLP - 1 agonist. On discussion today, states had recurring problems with pancreatitis in the 80s. Will d/w cardiology. Given her renal function, she is unable to use SGLT-2 and metformin . Last A1c increased 8.7 (up from 7.2). discussed. Discussed insulin . She wanted to hold on insulin . Outside sugars reviewed. Most recent readings 113-150 in the am and 150-180s in the pm. Continue low carb diet and exercise. Taking januvia .

## 2024-05-27 NOTE — Assessment & Plan Note (Signed)
 Continue lipitor.  Low cholesterol diet and exercise.  Follow lipid panel and liver function tests. No changes today.

## 2024-05-27 NOTE — Progress Notes (Signed)
 Subjective:    Patient ID: [name] [name], female    DOB: March 06, 1935, 89 y.o.   MRN: [id number]  Patient here for  Chief Complaint  Patient presents with   Medical Management of Chronic Issues    HPI Here for a scheduled follow up -  follow up regarding hypercholesterolemia, afib, diabetes and hypertension. Continues on eliquis . Continues f/u with cardiology. Previous Echocardiogram during admission with EF of 55 to 60% but with severely elevated RVSP. Patient was aggressively diuresed and underwent right heart catheterization which demonstrated preserved cardiac index and PA mean of 28 mmHg. S/p cardioversion 12/13/23. Last evaluated 02/28/24 - increased sob and edema. Lasix  d/c'd and was started on torsemide  80mg  daily for 5 days . Currently on 60mg  q day now. Saw nephrology 02/13/24 - recommended stopping amlodipine  and was started on losartan. Had f/u with pulmonary and cardiology this month. SOB has improved. PFTs - signoficant improvement in her DLCO. Recommend to continue optimization of volume status and treatment of her underlying OSA. Continue CPAP. Weight is stable =- improved. Seeing nephrology tomorrow. Breathing is better. Overall feels better. Bowels doing ok with miralax . She is having some left ear discomfort and increased soft tissue fullness - right ear. Request ENT evaluation.    Past Medical History:  Diagnosis Date   1st degree AV block    a. PR 400 msec with Apacing   A-fib (HCC)    Hx of   Anemia    Anxiety    Arthritis    knees and lower back    Chest pain    a. 2004 Cath:  reportedly nl;  b. 09/2013 Neg MV; c. 11/2017 nl ETT; d. 08/2023 MV: No isch/infarct. nl LV fxn. Mild Ao Ca2+.   Chronic diastolic heart failure (HCC)    a. 09/2013 Echo: EF 50-55%, mild LVH, mild to mod MR/TR; b. 04/2023 Echo: EF 55-60%, no rwma, nl RV size/fxn, RVSP 44.40mmHg. Mod dil LA. Mod MR/TR.   CKD (chronic kidney disease), stage IV (HCC)    Colon polyps    Congestive heart disease (HCC)     Esophageal reflux    Glaucoma    Gout    H/O hiatal hernia    History of UTI    Hyperlipidemia    Moderate mitral regurgitation    a. 04/2023 Echo: Mod MR.   Morbid obesity (HCC)    OSA (obstructive sleep apnea)    OSA on CPAP    CPAP   Pacemaker    copy of medtronic card in chart   PAF (paroxysmal atrial fibrillation) (HCC)    a. 10/2023 s/p DCCV (150J); b. CHA2DS2VASc = 5-->warfarin.   Primary hypertension    controlled on meds   Pulmonary hypertension (HCC)    a. 04/2023 Echo: RVSP 44.19mmHg.   Seasonal allergies    SSS (sick sinus syndrome) (HCC)    a. 2008 s/p MDT PPM;  b. 11/2013 Gen change- MDT ADDRL1 Adapta DC PPM, ser # [id number].   Symptomatic PVCs    Syncope and collapse    a. Felt to be vasovagal.   Thyroid  disease    nodules on thyroid , with goiter, on no meds per pt   Tricuspid regurgitation    Type II diabetes mellitus (HCC)    Type 2   Unspecified glaucoma(365.9)    Vertigo    in past   Wears dentures    upper full and lower partial   Past Surgical History:  Procedure Laterality Date   APPENDECTOMY  CARDIAC CATHETERIZATION     CARDIOVERSION N/A 04/26/2017   Procedure: Cardioversion;  Surgeon: Perla Evalene PARAS, MD;  Location: [facility] ORS;  Service: Cardiovascular;  Laterality: N/A;   CARDIOVERSION N/A 10/17/2023   Procedure: CARDIOVERSION;  Surgeon: Perla Evalene PARAS, MD;  Location: [facility] ORS;  Service: Cardiovascular;  Laterality: N/A;   CARDIOVERSION N/A 12/13/2023   Procedure: CARDIOVERSION;  Surgeon: Perla Evalene PARAS, MD;  Location: [facility] OR[facility];  Service: Cardiovascular;  Laterality: N/A;   CATARACT EXTRACTION W/PHACO Left 07/21/2020   Procedure: CATARACT EXTRACTION PHACO AND INTRAOCULAR LENS PLACEMENT (IOC) LEFT DIABETIC;  Surgeon: Mittie Gaskin, MD;  Location: [facility];  Service: Ophthalmology;  Laterality: Left;  10.25 1:13.2 14.0%   CATARACT EXTRACTION W/PHACO Right 08/11/2020   Procedure: CATARACT EXTRACTION PHACO AND INTRAOCULAR  LENS PLACEMENT (IOC) RIGHT DIABETIC;  Surgeon: Mittie Gaskin, MD;  Location: [facility];  Service: Ophthalmology;  Laterality: Right;  11.63 1:25.0 13.7%   DILATION AND CURETTAGE OF UTERUS     INCISION AND DRAINAGE OF WOUND Right 03/1998   leg; it was like a boil (12/01/2013)   INSERT / REPLACE / REMOVE PACEMAKER  2008; 12/01/2013   MDT ADDRL1 pacemaker - gen change by Dr Fernande 12/01/2013   LUNG BIOPSY  2010   unc   PERMANENT PACEMAKER GENERATOR CHANGE N/A 12/01/2013   Procedure: PERMANENT PACEMAKER GENERATOR CHANGE;  Surgeon: Elspeth JAYSON Fernande, MD;  Location: [facility] CATH LAB;  Service: Cardiovascular;  Laterality: N/A;   RIGHT HEART CATH N/A 10/23/2023   Procedure: RIGHT HEART CATH;  Surgeon: Gardenia Led, DO;  Location: [facility] INVASIVE CV LAB;  Service: Cardiovascular;  Laterality: N/A;   VAGINAL HYSTERECTOMY  1970   Family History  Problem Relation Age of Onset   Cancer Mother    Liver cancer Mother    Pancreatic cancer Mother    Heart disease Father    Rheum arthritis Father    Allergies Father    Hypertension Sister    Rheum arthritis Sister    COPD Brother    Breast cancer Maternal Aunt    Social History   Socioeconomic History   Marital status: Widowed    Spouse name: Not on file   Number of children: 2   Years of education: Not on file   Highest education level: 12th grade  Occupational History   Occupation: retired  Tobacco Use   Smoking status: Former    Current packs/day: 0.00    Average packs/day: 0.5 packs/day for 34.0 years (17.0 ttl pk-yrs)    Types: Cigarettes    Start date: 08/16/1955    Quit date: 08/15/1989    Years since quitting: 34.8   Smokeless tobacco: Never  Vaping Use   Vaping status: Never Used  Substance and Sexual Activity   Alcohol use: No    Alcohol/week: 0.0 standard drinks of alcohol   Drug use: No   Sexual activity: Never  Other Topics Concern   Not on file  Social History Narrative   Retired. Widowed. Regularly  exercises.    Social Drivers of Corporate investment banker Strain: Low Risk  (01/07/2024)   Overall Financial Resource Strain (CARDIA)    Difficulty of Paying Living Expenses: Not hard at all  Food Insecurity: No Food Insecurity (01/07/2024)   Hunger Vital Sign    Worried About Running Out of Food in the Last Year: Never true    Ran Out of Food in the Last Year: Never true  Transportation Needs: No Transportation Needs (01/07/2024)  PRAPARE - Administrator, Civil Service (Medical): No    Lack of Transportation (Non-Medical): No  Physical Activity: Sufficiently Active (01/07/2024)   Exercise Vital Sign    Days of Exercise per Week: 3 days    Minutes of Exercise per Session: 60 min  Stress: No Stress Concern Present (01/07/2024)   [employer] of Occupational Health - Occupational Stress Questionnaire    Feeling of Stress : Not at all  Social Connections: Moderately Integrated (01/07/2024)   Social Connection and Isolation Panel    Frequency of Communication with Friends and Family: More than three times a week    Frequency of Social Gatherings with Friends and Family: More than three times a week    Attends Religious Services: More than 4 times per year    Active Member of [employer] or Organizations: Yes    Attends Banker Meetings: More than 4 times per year    Marital Status: Widowed     Review of Systems  Constitutional:  Negative for appetite change and unexpected weight change.       Weight has decreased.   HENT:  Negative for congestion and sinus pressure.   Respiratory:  Negative for cough and chest tightness.        Breathing improved.   Cardiovascular:  Negative for chest pain and palpitations.       Leg swelling - improved.   Gastrointestinal:  Negative for abdominal pain, diarrhea, nausea and vomiting.  Genitourinary:  Negative for difficulty urinating and dysuria.  Musculoskeletal:  Negative for joint swelling and myalgias.  Skin:  Negative for  color change and rash.  Neurological:  Negative for dizziness and headaches.  Psychiatric/Behavioral:  Negative for agitation and dysphoric mood.        Objective:     BP 122/78   Pulse 76   Temp 97.8 F (36.6 C)   Ht 5' 4 (1.626 m)   Wt 214 lb 6.4 oz (97.3 kg)   SpO2 95%   BMI 36.80 kg/m  Wt Readings from Last 3 Encounters:  05/27/24 214 lb 6.4 oz (97.3 kg)  05/20/24 218 lb (98.9 kg)  05/14/24 218 lb 6 oz (99.1 kg)    Physical Exam Vitals reviewed.  Constitutional:      General: She is not in acute distress.    Appearance: Normal appearance.  HENT:     Head: Normocephalic and atraumatic.     Right Ear: External ear normal.     Left Ear: External ear normal.     Mouth/Throat:     Pharynx: No oropharyngeal exudate or posterior oropharyngeal erythema.   Eyes:     General: No scleral icterus.       Right eye: No discharge.        Left eye: No discharge.     Conjunctiva/sclera: Conjunctivae normal.   Neck:     Thyroid : No thyromegaly.   Cardiovascular:     Rate and Rhythm: Normal rate and regular rhythm.  Pulmonary:     Effort: No respiratory distress.     Breath sounds: Normal breath sounds. No wheezing.  Abdominal:     General: Bowel sounds are normal.     Palpations: Abdomen is soft.     Tenderness: There is no abdominal tenderness.   Musculoskeletal:        General: No tenderness.     Cervical back: Neck supple. No tenderness.     Comments: No increased swelling.   Lymphadenopathy:  Cervical: No cervical adenopathy.   Skin:    Findings: No erythema or rash.   Neurological:     Mental Status: She is alert.   Psychiatric:        Mood and Affect: Mood normal.        Behavior: Behavior normal.         Outpatient Encounter Medications as of 05/27/2024  Medication Sig   acetaminophen  (TYLENOL ) 500 MG tablet Take 1,000 mg by mouth every 6 (six) hours as needed for moderate pain.    allopurinol  (ZYLOPRIM ) 100 MG tablet Take 1 tablet (100 mg  total) by mouth daily.   atorvastatin  (LIPITOR) 20 MG tablet TAKE 1 TABLET (20 MG TOTAL) BY MOUTH DAILY.   Blood Glucose Monitoring Suppl (FIFTY50 GLUCOSE METER 2.0) w/Device KIT    calcitRIOL  (ROCALTROL ) 0.25 MCG capsule TAKE 1 CAPSULE (0.25 MCG TOTAL) BY MOUTH 1 (ONE) TIME EACH DAY   carvedilol  (COREG ) 12.5 MG tablet Take 12.5 mg by mouth 2 (two) times daily with a meal.   ferrous sulfate  325 (65 FE) MG EC tablet Take 325 mg by mouth daily.    fluticasone -salmeterol (ADVAIR) 100-50 MCG/ACT AEPB Inhale 1 puff into the lungs 2 (two) times daily.   glipiZIDE  (GLUCOTROL ) 10 MG tablet Take 5 mg by mouth daily.   Glycerin , Laxative, (FLEET LIQUID GLYCERIN  SUPP RE) Place 1 Dose rectally daily as needed (constipation).   losartan (COZAAR) 25 MG tablet Take 25 mg by mouth daily.   mometasone  (ELOCON ) 0.1 % cream Apply to affected area daily prn.   mometasone  (ELOCON ) 0.1 % cream Apply topically.   Multiple Vitamins-Minerals (CENTRUM SILVER PO) Take 1 tablet by mouth daily.    OneTouch Delica Lancets 33G MISC    ONETOUCH VERIO test strip ONCE DAILY USE TO TEST BLOOD SUGAR ONCE DAILY E11.22   pantoprazole  (PROTONIX ) 40 MG tablet Take 1 tablet (40 mg total) by mouth daily.   polyethylene glycol (MIRALAX  / GLYCOLAX ) packet Take 17 g by mouth daily as needed for moderate constipation.   sitaGLIPtin  (JANUVIA ) 25 MG tablet Take 1 tablet (25 mg total) by mouth daily.   spironolactone (ALDACTONE) 25 MG tablet Take 25 mg by mouth once.   torsemide  (DEMADEX ) 20 MG tablet Take 3 tablets (60 mg total) by mouth daily.   White Petrolatum-Mineral Oil (ARTIFICIAL TEARS) ointment Place 1 drop into both eyes daily as needed (Dry eyes).   [DISCONTINUED] ELIQUIS  2.5 MG TABS tablet TAKE ONE TABLET BY MOUTH TWICE A DAY   No facility-administered encounter medications on file as of 05/27/2024.     Lab Results  Component Value Date   WBC 5.9 12/28/2023   HGB 10.4 (L) 12/28/2023   HCT 30.8 (L) 12/28/2023   PLT 128 (L)  12/28/2023   GLUCOSE 158 (H) 05/23/2024   CHOL 136 05/23/2024   TRIG 83.0 05/23/2024   HDL 42.90 05/23/2024   LDLCALC 77 05/23/2024   ALT 15 05/23/2024   AST 19 05/23/2024   NA 137 05/23/2024   K 4.5 05/23/2024   CL 97 05/23/2024   CREATININE 5.53 (HH) 05/23/2024   BUN 77 (H) 05/23/2024   CO2 29 05/23/2024   TSH 1.49 02/06/2024   INR 1.8 (H) 11/09/2023   HGBA1C 7.5 (H) 05/23/2024       Assessment & Plan:  Mixed hyperlipidemia Assessment & Plan: Continue lipitor.  Low cholesterol diet and exercise.  Follow lipid panel and liver function tests. No changes today.    SOB (shortness of breath) Assessment &  Plan: She underwent an echocardiogram as outpatient in May of this year that showed an EF of 55 to 60%, moderate mitral valve regurgitation, mildly elevated pulmonary arterial pressure and moderate tricuspid regurgitation.  Which was not changed from prior studies. She also underwent PFTs that showed mild restriction with decreased TLC at 76% of predicted.  Mild restrictive pattern on spirometry without obstruction however severely reduced DLCO of 22% of predicted not corrected to hemoglobin. HRCT did not show any signs of Amiodarone  pulmonary toxicity.    Type 2 diabetes mellitus with stage 5 chronic kidney disease not on chronic dialysis, without long-term current use of insulin  [facility]) Assessment & Plan: Has been seeing Dr Cherilyn for her diabetes and f/u multinodular goiter. She was previously having some intermittent low sugars.  She restarted glipizide .  We discussed remaining off this medication, given the low sugars and her renal function. She did have pancreatitis in the past, so we had discussed avoiding GLP - 1 agonist. On discussion today, states had recurring problems with pancreatitis in the 80s. Will d/w cardiology. Given her renal function, she is unable to use SGLT-2 and metformin . Last A1c increased 8.7 (up from 7.2). discussed. Discussed insulin . She wanted to hold on  insulin . Outside sugars reviewed. Most recent readings 113-150 in the am and 150-180s in the pm. Continue low carb diet and exercise. Taking januvia .    Anemia due to chronic renal failure treated with erythropoietin, stage 4 (severe) (HCC) Assessment & Plan: Being followed by Dr Melanee.  Last labs did not reveal iron deficiency.  Has been taking oral iron. Continue f/u Dr Melanee.  Hgb 02/07/24 - 10. Continue to follow cbc.    Chronic diastolic heart failure [facility]) Assessment & Plan: Is s/p DCCV 12/2023. Amiodarone  was stopped. Continues on coreg . Continue eliquis . Continues f/u with cardiology. Previous Echocardiogram during admission with EF of 55 to 60% but with severely elevated RVSP. Patient was aggressively diuresed and underwent right heart catheterization which demonstrated preserved cardiac index and PA mean of 28 mmHg. S/p cardioversion 12/13/23. Evaluated 02/28/24 - increased sob and edema. Lasix  d/c'd and was started on torsemide  80mg  daily for 5 days.  Nephrology added losartan. Currently on torsemide  60mg  q day. She feels better. Breathing better. Weight down. Discussed rent labs.  GFR 6.5 with BUN 77. Discussed worsening renal function and adjustment in medication.  Hard to optimize volume status. Seeing nephrology tomorrow. She had taken medications today. Will d/w nephrology regarding medication adjustments and the possibility of starting dialysis.    CKD (chronic kidney disease) stage 5, GFR less than 15 ml/min (HCC) Assessment & Plan: GFR 6.5. discussed. She feels better. Breathing better. Has appt with nephrology tomorrow. Discussed medication adjustment and the possibility of dialysis.    Dyslipidemia Assessment & Plan: Continue statin therapy. Low cholesterol diet and exercise. Follow lipid panel.    Essential hypertension Assessment & Plan: Continues on carvedilol . Also on torsemide  and losartan. Due to see nephrology tomorrow. Discussed medicagtion adjustment. Will d/w nephrology  regarding medication changes.    Multinodular goiter Assessment & Plan: Followed by Dr Cherilyn - thyroid  - has been followed with serial ultrasounds.  Previous biopsy ok. Follow tsh.    OSA (obstructive sleep apnea) Assessment & Plan: Needs to continue cpap.    PAF (paroxysmal atrial fibrillation) (HCC) Assessment & Plan: Continues on amiodarone . On eliquis .  Heart rate controlled.    Pulmonary hypertension, unspecified (HCC) Assessment & Plan: Continue f/u with cardiology. Continue CPAP.    Secondary hyperparathyroidism  of renal origin [facility]) Assessment & Plan: Followed by Nephrology.    Left ear pain Assessment & Plan: Some left ear pain. No etiology found on exam. Increased soft tissue /cyst - right ear. Request referral to ENT  Orders: -     Ambulatory referral to ENT     Allena Hamilton, MD

## 2024-05-27 NOTE — Telephone Encounter (Signed)
 Eliquis  2.5mg  refill request received. Patient is 89 years old, weight-98.9kg, Crea-5.53 on 05/23/24, Diagnosis-Afib, and last seen by Tylene Lunch on 05/14/24. Dose is appropriate based on dosing criteria. Will send in refill to requested pharmacy.

## 2024-05-28 DIAGNOSIS — I129 Hypertensive chronic kidney disease with stage 1 through stage 4 chronic kidney disease, or unspecified chronic kidney disease: Secondary | ICD-10-CM | POA: Diagnosis not present

## 2024-05-28 DIAGNOSIS — N185 Chronic kidney disease, stage 5: Secondary | ICD-10-CM | POA: Diagnosis not present

## 2024-05-28 DIAGNOSIS — E785 Hyperlipidemia, unspecified: Secondary | ICD-10-CM | POA: Diagnosis not present

## 2024-05-28 DIAGNOSIS — E1122 Type 2 diabetes mellitus with diabetic chronic kidney disease: Secondary | ICD-10-CM | POA: Diagnosis not present

## 2024-05-28 DIAGNOSIS — I509 Heart failure, unspecified: Secondary | ICD-10-CM | POA: Diagnosis not present

## 2024-05-28 DIAGNOSIS — D631 Anemia in chronic kidney disease: Secondary | ICD-10-CM | POA: Diagnosis not present

## 2024-05-28 DIAGNOSIS — N2581 Secondary hyperparathyroidism of renal origin: Secondary | ICD-10-CM | POA: Diagnosis not present

## 2024-05-28 DIAGNOSIS — I10 Essential (primary) hypertension: Secondary | ICD-10-CM | POA: Diagnosis not present

## 2024-05-30 ENCOUNTER — Encounter: Payer: Self-pay | Admitting: Podiatry

## 2024-05-30 ENCOUNTER — Ambulatory Visit: Admitting: Podiatry

## 2024-05-30 DIAGNOSIS — E119 Type 2 diabetes mellitus without complications: Secondary | ICD-10-CM | POA: Diagnosis not present

## 2024-05-30 DIAGNOSIS — M79674 Pain in right toe(s): Secondary | ICD-10-CM | POA: Diagnosis not present

## 2024-05-30 DIAGNOSIS — M79675 Pain in left toe(s): Secondary | ICD-10-CM

## 2024-05-30 DIAGNOSIS — L84 Corns and callosities: Secondary | ICD-10-CM

## 2024-05-30 DIAGNOSIS — B351 Tinea unguium: Secondary | ICD-10-CM

## 2024-06-01 ENCOUNTER — Encounter: Payer: Self-pay | Admitting: Podiatry

## 2024-06-01 ENCOUNTER — Encounter: Payer: Self-pay | Admitting: Internal Medicine

## 2024-06-01 DIAGNOSIS — H9202 Otalgia, left ear: Secondary | ICD-10-CM | POA: Insufficient documentation

## 2024-06-01 NOTE — Assessment & Plan Note (Signed)
 GFR 6.5. discussed. She feels better. Breathing better. Has appt with nephrology tomorrow. Discussed medication adjustment and the possibility of dialysis.

## 2024-06-01 NOTE — Progress Notes (Signed)
 Subjective:  Patient ID: [name] [name], female    DOB: 06-23-1935,  MRN: [id number]  [name] [name] presents to clinic today for preventative diabetic foot care and callus(es) b/l feet and painful mycotic toenails that are difficult to trim. Painful toenails interfere with ambulation. Aggravating factors include wearing enclosed shoe gear. Pain is relieved with periodic professional debridement. Painful calluses are aggravated when weightbearing with and without shoegear. Pain is relieved with periodic professional debridement.  Chief Complaint  Patient presents with   [name] [name]    Rm2 DFC/ A1c 7.3/ Blood sugar 145/ Dr. Loraine last visit June 2025   New problem(s): None.   PCP is Glendia Shad, MD.  Allergies  Allergen Reactions   Metoprolol Other (See Comments)    Bad dreams Patient stated that she had suicidal thoughts while taking this medication.    Diltiazem  Other (See Comments)    Patient stated that she had bad dreams with this medication   Levofloxacin Nausea And Vomiting and Nausea Only         Review of Systems: Negative except as noted in the HPI.  Objective: No changes noted in today's physical examination. There were no vitals filed for this visit. [name] [name] is a pleasant 89 y.o. female in NAD. AAO x 3.  Vascular Examination: Capillary refill time immediate b/l. Palpable pedal pulses. Pedal hair present b/l. No pain with calf compression b/l. Skin temperature gradient WNL b/l. No cyanosis or clubbing b/l. No ischemia or gangrene noted b/l. +1 pitting edema noted BLE.  Neurological Examination: Sensation grossly intact b/l with 10 gram monofilament. Vibratory sensation intact b/l.   Dermatological Examination: Pedal skin with normal turgor, texture and tone b/l.  No open wounds. No interdigital macerations.   Toenails 1-5 b/l thick, discolored, elongated with subungual debris and pain on dorsal palpation.   Hyperkeratotic lesion(s) submet head 1 left foot and submet  head 5 left foot.  No erythema, no edema, no drainage, no fluctuance.  Musculoskeletal Examination: Muscle strength 5/5 to all lower extremity muscle groups bilaterally. No pain, crepitus or joint limitation noted with ROM b/l LE. Pes planus deformity noted bilateral LE.[name] [name] Patient ambulates with cane assistance.  Radiographs: None  Last A1c:      Latest Ref Rng & Units 05/23/2024    9:01 AM 02/06/2024    7:52 AM 09/26/2023   12:00 AM  Hemoglobin A1C  Hemoglobin-A1c 4.6 - 6.5 % 7.5  8.7  7.2         This result is from an external source.    Assessment/Plan: 1. Pain due to onychomycosis of toenails of both feet   2. Callus of foot   3. Controlled type 2 diabetes mellitus without complication, without long-term current use of insulin  [facility])   Consent given for treatment. Patient examined. All patient's and/or POA's questions/concerns addressed on today's visit. Mycotic toenails 1-5 debrided in length and girth without incident. Callus(es) submet head 1 left foot and submet head 5 left foot pared with sharp debridement without incident. Continue foot and shoe inspections daily. Monitor blood glucose per PCP/Endocrinologist's recommendations.Continue soft, supportive shoe gear daily. Report any pedal injuries to medical professional. Call office if there are any quesitons/concerns. -Patient/POA to call should there be question/concern in the interim.   Return in about 3 months (around 08/30/2024).  [name] [name], DPM      [place] LOCATION: [address]  [place], [place] [address]                   Office [phone]   [name] LOCATION: [address] [place], [place] [address] Office [phone]

## 2024-06-01 NOTE — Assessment & Plan Note (Signed)
 Continue f/u with cardiology. Continue CPAP.

## 2024-06-01 NOTE — Assessment & Plan Note (Signed)
 Followed by Dr Cherilyn - thyroid  - has been followed with serial ultrasounds.  Previous biopsy ok. Follow tsh.

## 2024-06-01 NOTE — Assessment & Plan Note (Signed)
 Continues on amiodarone. On eliquis.  Heart rate controlled.

## 2024-06-01 NOTE — Assessment & Plan Note (Signed)
 Is s/p DCCV 12/2023. Amiodarone  was stopped. Continues on coreg . Continue eliquis . Continues f/u with cardiology. Previous Echocardiogram during admission with EF of 55 to 60% but with severely elevated RVSP. Patient was aggressively diuresed and underwent right heart catheterization which demonstrated preserved cardiac index and PA mean of 28 mmHg. S/p cardioversion 12/13/23. Evaluated 02/28/24 - increased sob and edema. Lasix  d/c'd and was started on torsemide  80mg  daily for 5 days.  Nephrology added losartan. Currently on torsemide  60mg  q day. She feels better. Breathing better. Weight down. Discussed rent labs.  GFR 6.5 with BUN 77. Discussed worsening renal function and adjustment in medication.  Hard to optimize volume status. Seeing nephrology tomorrow. She had taken medications today. Will d/w nephrology regarding medication adjustments and the possibility of starting dialysis.

## 2024-06-01 NOTE — Assessment & Plan Note (Signed)
Needs to continue cpap.

## 2024-06-01 NOTE — Assessment & Plan Note (Signed)
 Being followed by Dr Melanee.  Last labs did not reveal iron deficiency.  Has been taking oral iron. Continue f/u Dr Melanee.  Hgb 02/07/24 - 10. Continue to follow cbc.

## 2024-06-01 NOTE — Assessment & Plan Note (Signed)
 Some left ear pain. No etiology found on exam. Increased soft tissue /cyst - right ear. Request referral to ENT

## 2024-06-01 NOTE — Assessment & Plan Note (Signed)
 Continue statin therapy. Low cholesterol diet and exercise. Follow lipid panel.

## 2024-06-01 NOTE — Assessment & Plan Note (Signed)
 Followed by Nephrology

## 2024-06-01 NOTE — Assessment & Plan Note (Signed)
 Continues on carvedilol . Also on torsemide  and losartan. Due to see nephrology tomorrow. Discussed medicagtion adjustment. Will d/w nephrology regarding medication changes.

## 2024-06-10 DIAGNOSIS — H353221 Exudative age-related macular degeneration, left eye, with active choroidal neovascularization: Secondary | ICD-10-CM | POA: Diagnosis not present

## 2024-06-12 ENCOUNTER — Encounter: Payer: Self-pay | Admitting: Sleep Medicine

## 2024-06-12 ENCOUNTER — Ambulatory Visit: Admitting: Sleep Medicine

## 2024-06-12 VITALS — BP 110/60 | HR 70 | Temp 97.7°F | Ht 63.0 in | Wt 214.6 lb

## 2024-06-12 DIAGNOSIS — I4892 Unspecified atrial flutter: Secondary | ICD-10-CM

## 2024-06-12 DIAGNOSIS — G4733 Obstructive sleep apnea (adult) (pediatric): Secondary | ICD-10-CM

## 2024-06-12 DIAGNOSIS — Z87891 Personal history of nicotine dependence: Secondary | ICD-10-CM | POA: Diagnosis not present

## 2024-06-12 DIAGNOSIS — I10 Essential (primary) hypertension: Secondary | ICD-10-CM

## 2024-06-12 DIAGNOSIS — I4891 Unspecified atrial fibrillation: Secondary | ICD-10-CM

## 2024-06-12 NOTE — Progress Notes (Signed)
 Name:[name] [name] MRN: [id number] DOB: 1935/08/15   CHIEF COMPLAINT:  ESTABLISH CARE FOR OSA   HISTORY OF PRESENT ILLNESS:  [name] [name] is a 89 y.o. w/ a h/o OSA, CHF, DMII, HTN, hyperlipidemia, atrial fibrillation and obesity who presents to establish care for OSA. Reports using CPAP therapy almost every night, which is confirmed by compliance data. She is currently using a full face mask. Reports feeling more refreshed upon awakening with CPAP therapy. Reports nocturnal awakenings due to unclear reasons and has difficulty falling back to sleep. Reports a 30 lb weight loss over the last year. Denies morning headaches, RLS symptoms, dream enactment, cataplexy, hypnagogic or hypnapompic hallucinations. Denies a family history of sleep apnea. Denies drowsy driving. Drinks 1 cup of decaf coffee almost every morning, denies alcohol, tobacco or illicit drug use.   Bedtime 9 pm Sleep onset 30 mins Rise time 5:30-6:30 am   EPWORTH SLEEP SCORE 11    06/12/2024    9:00 AM 10/06/2016   10:00 AM  Results of the Epworth flowsheet  Sitting and reading 3 2  Watching TV 3 1  Sitting, inactive in a public place (e.g. a theatre or a meeting) 1 0  As a passenger in a car for an hour without a break 0 0  Lying down to rest in the afternoon when circumstances permit 2 1  Sitting and talking to someone 0 0  Sitting quietly after a lunch without alcohol 2 3  In a car, while stopped for a few minutes in traffic 0 0  Total score 11 7     PAST MEDICAL HISTORY :   has a past medical history of 1st degree AV block, A-fib (HCC), Anemia, Anxiety, Arthritis, Chest pain, Chronic diastolic heart failure (HCC), CKD (chronic kidney disease), stage IV (HCC), Colon polyps, Congestive heart disease (HCC), Esophageal reflux, Glaucoma, Gout, H/O hiatal hernia, History of UTI, Hyperlipidemia, Moderate mitral regurgitation, Morbid obesity (HCC), OSA (obstructive sleep apnea), OSA on CPAP, Pacemaker, PAF  (paroxysmal atrial fibrillation) (HCC), Primary hypertension, Pulmonary hypertension (HCC), Seasonal allergies, SSS (sick sinus syndrome) (HCC), Symptomatic PVCs, Syncope and collapse, Thyroid  disease, Tricuspid regurgitation, Type II diabetes mellitus (HCC), Unspecified glaucoma(365.9), Vertigo, and Wears dentures.  has a past surgical history that includes Incision and drainage of wound (Right, 03/1998); Insert / replace / remove pacemaker (2008; 12/01/2013); Dilation and curettage of uterus; Permanent pacemaker generator change (N/A, 12/01/2013); Lung biopsy (2010); CARDIOVERSION (N/A, 04/26/2017); Appendectomy; Vaginal hysterectomy (1970); Cardiac catheterization; Cataract extraction w/PHACO (Left, 07/21/2020); Cataract extraction w/PHACO (Right, 08/11/2020); Cardioversion (N/A, 10/17/2023); RIGHT HEART CATH (N/A, 10/23/2023); and Cardioversion (N/A, 12/13/2023). Prior to Admission medications   Medication Sig Start Date End Date Taking? Authorizing Provider  acetaminophen  (TYLENOL ) 500 MG tablet Take 1,000 mg by mouth every 6 (six) hours as needed for moderate pain.    Yes [provider]  allopurinol  (ZYLOPRIM ) 100 MG tablet Take 1 tablet (100 mg total) by mouth daily. 03/07/24  Yes Glendia Shad, MD  apixaban  (ELIQUIS ) 2.5 MG TABS tablet Take 1 tablet (2.5 mg total) by mouth 2 (two) times daily. 05/27/24  Yes Gollan, Timothy J, MD  atorvastatin  (LIPITOR) 20 MG tablet TAKE 1 TABLET (20 MG TOTAL) BY MOUTH DAILY. 12/06/23  Yes Glendia Shad, MD  Blood Glucose Monitoring Suppl (FIFTY50 GLUCOSE METER 2.0) w/Device KIT  07/06/20  Yes [provider]  calcitRIOL  (ROCALTROL ) 0.25 MCG capsule TAKE 1 CAPSULE (0.25 MCG TOTAL) BY MOUTH 1 (ONE) TIME EACH DAY 05/12/21  Yes [provider]  carvedilol  (COREG ) 12.5 MG tablet Take 12.5 mg by mouth 2 (two) times daily with a meal. 04/16/24 04/16/25 Yes [provider]  ferrous sulfate  325 (65 FE) MG EC tablet Take 325 mg by mouth daily.   03/01/17  Yes [provider]  fluticasone -salmeterol (ADVAIR) 100-50 MCG/ACT AEPB Inhale 1 puff into the lungs 2 (two) times daily. 03/13/24  Yes [provider]  glipiZIDE  (GLUCOTROL ) 10 MG tablet Take 5 mg by mouth daily. 03/13/24  Yes [provider]  Glycerin , Laxative, (FLEET LIQUID GLYCERIN  SUPP RE) Place 1 Dose rectally daily as needed (constipation).   Yes [provider]  losartan (COZAAR) 25 MG tablet Take 25 mg by mouth daily. 02/13/24  Yes [provider]  mometasone  (ELOCON ) 0.1 % cream Apply to affected area daily prn. 03/07/24  Yes Glendia Shad, MD  mometasone  (ELOCON ) 0.1 % cream Apply topically. 02/02/17  Yes [provider]  Multiple Vitamins-Minerals (CENTRUM SILVER PO) Take 1 tablet by mouth daily.    Yes [provider]  OneTouch Delica Lancets 33G MISC  07/20/20  Yes [provider]  [facility] VERIO test strip ONCE DAILY USE TO TEST BLOOD SUGAR ONCE DAILY E11.22 12/24/23  Yes Glendia Shad, MD  pantoprazole  (PROTONIX ) 40 MG tablet Take 1 tablet (40 mg total) by mouth daily. 03/07/24  Yes Glendia Shad, MD  polyethylene glycol (MIRALAX  / GLYCOLAX ) packet Take 17 g by mouth daily as needed for moderate constipation.   Yes [provider]  sitaGLIPtin  (JANUVIA ) 25 MG tablet Take 1 tablet (25 mg total) by mouth daily. 10/25/23  Yes Glendia Shad, MD  spironolactone (ALDACTONE) 25 MG tablet Take 25 mg by mouth once. 03/13/24 03/13/25 Yes [provider]  torsemide  (DEMADEX ) 20 MG tablet Take 3 tablets (60 mg total) by mouth daily. 05/13/24  Yes Bensimhon, Daniel R, MD  White Petrolatum-Mineral Oil (ARTIFICIAL TEARS) ointment Place 1 drop into both eyes daily as needed (Dry eyes).   Yes [provider]   Allergies  Allergen Reactions   Metoprolol Other (See Comments)    Bad dreams Patient stated that she had suicidal thoughts while taking this medication.    Diltiazem  Other (See  Comments)    Patient stated that she had bad dreams with this medication   Levofloxacin Nausea And Vomiting and Nausea Only         FAMILY HISTORY:  family history includes Allergies in her father; Breast cancer in her maternal aunt; COPD in her brother; Cancer in her mother; Heart disease in her father; Hypertension in her sister; Liver cancer in her mother; Pancreatic cancer in her mother; Rheum arthritis in her father and sister. SOCIAL HISTORY:  reports that she quit smoking about 34 years ago. Her smoking use included cigarettes. She started smoking about 68 years ago. She has a 17 pack-year smoking history. She has never used smokeless tobacco. She reports that she does not drink alcohol and does not use drugs.   Review of Systems:  Gen:  Denies  fever, sweats, chills weight loss  HEENT: Denies blurred vision, double vision, ear pain, eye pain, hearing loss, nose bleeds, sore throat Cardiac:  No dizziness, chest pain or heaviness, chest tightness,edema, No JVD Resp:   No cough, -sputum production, -shortness of breath,-wheezing, -hemoptysis,  Gi: Denies swallowing difficulty, stomach pain, nausea or vomiting, diarrhea, constipation, bowel incontinence Gu:  Denies bladder incontinence, burning urine Ext:   Denies Joint pain, stiffness or swelling Skin: Denies  skin rash, easy bruising or bleeding or hives Endoc:  Denies polyuria, polydipsia , polyphagia or weight change Psych:   Denies depression, insomnia or hallucinations  Other:  All other systems negative  VITAL SIGNS: BP 110/60 (BP Location: Right Arm, Patient Position: Sitting, Cuff Size: Large)   Pulse 70   Temp 97.7 F (36.5 C) (Oral)   Ht 5' 3 (1.6 m)   Wt 214 lb 9.6 oz (97.3 kg)   SpO2 98%   BMI 38.01 kg/m    Physical Examination:   General Appearance: No distress  EYES PERRLA, EOM intact.   NECK Supple, No JVD Pulmonary: normal breath sounds, No wheezing.  CardiovascularNormal S1,S2.  No m/r/g.   Abdomen:  Benign, Soft, non-tender. Skin:   warm, no rashes, no ecchymosis  Extremities: normal, no cyanosis, clubbing. Neuro:without focal findings,  speech normal  PSYCHIATRIC: Mood, affect within normal limits.   ASSESSMENT AND PLAN  OSA Patient is using and benefiting from CPAP therapy. Discussed the consequences of untreated sleep apnea. Advised not to drive drowsy for safety of patient and others. Will follow up in 3 months.    HTN Stable, on current management. Following with PCP.   Atrial fibrillation Stable, on current management.    Patient satisfied with Plan of action and management. All questions answered  I spent a total of 36 minutes reviewing chart data, face-to-face evaluation with the patient, counseling and coordination of care as detailed above.    Laycie Schriner, M.D.  Sleep Medicine [facility]

## 2024-06-12 NOTE — Patient Instructions (Signed)

## 2024-06-23 ENCOUNTER — Ambulatory Visit: Payer: Self-pay

## 2024-06-23 DIAGNOSIS — I495 Sick sinus syndrome: Secondary | ICD-10-CM | POA: Diagnosis not present

## 2024-06-24 DIAGNOSIS — E1122 Type 2 diabetes mellitus with diabetic chronic kidney disease: Secondary | ICD-10-CM | POA: Diagnosis not present

## 2024-06-24 DIAGNOSIS — D631 Anemia in chronic kidney disease: Secondary | ICD-10-CM | POA: Diagnosis not present

## 2024-06-24 DIAGNOSIS — I129 Hypertensive chronic kidney disease with stage 1 through stage 4 chronic kidney disease, or unspecified chronic kidney disease: Secondary | ICD-10-CM | POA: Diagnosis not present

## 2024-06-24 DIAGNOSIS — N2581 Secondary hyperparathyroidism of renal origin: Secondary | ICD-10-CM | POA: Diagnosis not present

## 2024-06-24 DIAGNOSIS — I10 Essential (primary) hypertension: Secondary | ICD-10-CM | POA: Diagnosis not present

## 2024-06-24 DIAGNOSIS — N185 Chronic kidney disease, stage 5: Secondary | ICD-10-CM | POA: Diagnosis not present

## 2024-06-24 DIAGNOSIS — I509 Heart failure, unspecified: Secondary | ICD-10-CM | POA: Diagnosis not present

## 2024-06-24 DIAGNOSIS — E785 Hyperlipidemia, unspecified: Secondary | ICD-10-CM | POA: Diagnosis not present

## 2024-06-24 DIAGNOSIS — R8290 Unspecified abnormal findings in urine: Secondary | ICD-10-CM | POA: Diagnosis not present

## 2024-06-24 LAB — CUP PACEART REMOTE DEVICE CHECK
Battery Impedance: 1654 Ohm
Battery Remaining Longevity: 47 mo
Battery Voltage: 2.76 V
Brady Statistic RV Percent Paced: 100 %
Date Time Interrogation Session: 20250721090335
Implantable Lead Connection Status: 753985
Implantable Lead Connection Status: 753985
Implantable Lead Implant Date: 20080117
Implantable Lead Implant Date: 20080117
Implantable Lead Location: 753859
Implantable Lead Location: 753860
Implantable Lead Model: 5076
Implantable Lead Model: 5076
Implantable Pulse Generator Implant Date: 20141229
Lead Channel Impedance Value: 634 Ohm
Lead Channel Impedance Value: 67 Ohm
Lead Channel Pacing Threshold Amplitude: 0.75 V
Lead Channel Pacing Threshold Pulse Width: 0.4 ms
Lead Channel Setting Pacing Amplitude: 2.5 V
Lead Channel Setting Pacing Pulse Width: 0.4 ms
Lead Channel Setting Sensing Sensitivity: 4 mV
Zone Setting Status: 755011
Zone Setting Status: 755011

## 2024-06-26 ENCOUNTER — Ambulatory Visit: Payer: Self-pay | Admitting: Cardiology

## 2024-06-26 ENCOUNTER — Other Ambulatory Visit: Payer: Self-pay

## 2024-06-26 DIAGNOSIS — D631 Anemia in chronic kidney disease: Secondary | ICD-10-CM

## 2024-06-27 ENCOUNTER — Inpatient Hospital Stay: Payer: PPO | Attending: Oncology | Admitting: Nurse Practitioner

## 2024-06-27 ENCOUNTER — Other Ambulatory Visit: Payer: PPO

## 2024-06-27 ENCOUNTER — Inpatient Hospital Stay

## 2024-06-27 ENCOUNTER — Encounter: Payer: Self-pay | Admitting: Nurse Practitioner

## 2024-06-27 VITALS — BP 127/65 | HR 84 | Resp 18 | Wt 214.0 lb

## 2024-06-27 DIAGNOSIS — D631 Anemia in chronic kidney disease: Secondary | ICD-10-CM

## 2024-06-27 DIAGNOSIS — D696 Thrombocytopenia, unspecified: Secondary | ICD-10-CM | POA: Diagnosis not present

## 2024-06-27 DIAGNOSIS — D649 Anemia, unspecified: Secondary | ICD-10-CM | POA: Diagnosis not present

## 2024-06-27 DIAGNOSIS — R040 Epistaxis: Secondary | ICD-10-CM | POA: Insufficient documentation

## 2024-06-27 DIAGNOSIS — N184 Chronic kidney disease, stage 4 (severe): Secondary | ICD-10-CM | POA: Diagnosis not present

## 2024-06-27 LAB — CBC (CANCER CENTER ONLY)
HCT: 27.7 % — ABNORMAL LOW (ref 36.0–46.0)
Hemoglobin: 9.5 g/dL — ABNORMAL LOW (ref 12.0–15.0)
MCH: 27.5 pg (ref 26.0–34.0)
MCHC: 34.3 g/dL (ref 30.0–36.0)
MCV: 80.3 fL (ref 80.0–100.0)
Platelet Count: 117 K/uL — ABNORMAL LOW (ref 150–400)
RBC: 3.45 MIL/uL — ABNORMAL LOW (ref 3.87–5.11)
RDW: 16.8 % — ABNORMAL HIGH (ref 11.5–15.5)
WBC Count: 6.1 K/uL (ref 4.0–10.5)
nRBC: 0 % (ref 0.0–0.2)

## 2024-06-27 LAB — IRON AND TIBC
Iron: 66 ug/dL (ref 28–170)
Saturation Ratios: 22 % (ref 10.4–31.8)
TIBC: 305 ug/dL (ref 250–450)
UIBC: 239 ug/dL

## 2024-06-27 LAB — FERRITIN: Ferritin: 114 ng/mL (ref 11–307)

## 2024-06-27 NOTE — Addendum Note (Signed)
 Addended by: BETHENE ALVIN RAMAN on: 06/27/2024 11:32 AM   Modules accepted: Orders

## 2024-06-27 NOTE — Progress Notes (Signed)
 [facility]   [phone] [phone]    Patient Care Team: Glendia Shad, MD as PCP - General (Internal Medicine) Cindie Ole DASEN, MD as PCP - Electrophysiology (Cardiology) Perla Evalene PARAS, MD as PCP - Cardiology (Cardiology) Perla Evalene PARAS, MD as Consulting Physician (Cardiology) Marcelino Gales, MD as Consulting Physician (Nephrology) Waddell Danelle ORN, MD as Consulting Physician (Cardiology) Carolee Manus DASEN., MD (Ophthalmology) Janit Thresa HERO, DPM as Consulting Physician (Podiatry) Melanee Annah BROCKS, MD as Consulting Physician (Hematology and Oncology) Malka Domino, MD as Consulting Physician (Pulmonary Disease) Pa, [facility] (Optometry)   CHIEF COMPLAINT: Follow up anemia   CURRENT THERAPY: Epo inj, last given 06/2022, currently on observation  INTERVAL HISTORY  [name] [name] returns for follow-up as scheduled, last seen by Dr. Melanee 06/2023.  Denies any significant changes in her health except she has been having nosebleeds once to twice per month and is currently having one. She is on eliquis  for Afib. Denies other bleeding. Doing well otherwise. Appetite changed, full quicker. Cutting back on po to manage health conditions. Top denture is broken so not eating meat. In process of getting that fixed.   Still goes to [facility] 3 times per week and does housework. Denies fever, night sweats, infection, new pain.   ROS  All other systems reviewed and negative  Past Medical History:  Diagnosis Date   1st degree AV block    a. PR 400 msec with Apacing   A-fib (HCC)    Hx of   Anemia    Anxiety    Arthritis    knees and lower back    Chest pain    a. 2004 Cath:  reportedly nl;  b. 09/2013 Neg MV; c. 11/2017 nl ETT; d. 08/2023 MV: No isch/infarct. nl LV fxn. Mild Ao Ca2+.   Chronic diastolic heart failure (HCC)    a. 09/2013 Echo: EF 50-55%, mild LVH, mild to mod MR/TR; b. 04/2023 Echo: EF 55-60%, no rwma, nl RV size/fxn, RVSP  44.44mmHg. Mod dil LA. Mod MR/TR.   CKD (chronic kidney disease), stage IV (HCC)    Colon polyps    Congestive heart disease (HCC)    Esophageal reflux    Glaucoma    Gout    H/O hiatal hernia    History of UTI    Hyperlipidemia    Moderate mitral regurgitation    a. 04/2023 Echo: Mod MR.   Morbid obesity (HCC)    OSA (obstructive sleep apnea)    OSA on CPAP    CPAP   Pacemaker    copy of medtronic card in chart   PAF (paroxysmal atrial fibrillation) (HCC)    a. 10/2023 s/p DCCV (150J); b. CHA2DS2VASc = 5-->warfarin.   Primary hypertension    controlled on meds   Pulmonary hypertension (HCC)    a. 04/2023 Echo: RVSP 44.33mmHg.   Seasonal allergies    SSS (sick sinus syndrome) (HCC)    a. 2008 s/p MDT PPM;  b. 11/2013 Gen change- MDT ADDRL1 Adapta DC PPM, ser # [id number].   Symptomatic PVCs    Syncope and collapse    a. Felt to be vasovagal.   Thyroid  disease    nodules on thyroid , with goiter, on no meds per pt   Tricuspid regurgitation    Type II diabetes mellitus (HCC)    Type 2   Unspecified glaucoma(365.9)    Vertigo    in past   Wears dentures  upper full and lower partial     Past Surgical History:  Procedure Laterality Date   APPENDECTOMY     CARDIAC CATHETERIZATION     CARDIOVERSION N/A 04/26/2017   Procedure: Cardioversion;  Surgeon: Perla Evalene PARAS, MD;  Location: [facility] ORS;  Service: Cardiovascular;  Laterality: N/A;   CARDIOVERSION N/A 10/17/2023   Procedure: CARDIOVERSION;  Surgeon: Perla Evalene PARAS, MD;  Location: [facility] ORS;  Service: Cardiovascular;  Laterality: N/A;   CARDIOVERSION N/A 12/13/2023   Procedure: CARDIOVERSION;  Surgeon: Perla Evalene PARAS, MD;  Location: [facility] ORS;  Service: Cardiovascular;  Laterality: N/A;   CATARACT EXTRACTION W/PHACO Left 07/21/2020   Procedure: CATARACT EXTRACTION PHACO AND INTRAOCULAR LENS PLACEMENT (IOC) LEFT DIABETIC;  Surgeon: Mittie Gaskin, MD;  Location: [facility];  Service: Ophthalmology;   Laterality: Left;  10.25 1:13.2 14.0%   CATARACT EXTRACTION W/PHACO Right 08/11/2020   Procedure: CATARACT EXTRACTION PHACO AND INTRAOCULAR LENS PLACEMENT (IOC) RIGHT DIABETIC;  Surgeon: Mittie Gaskin, MD;  Location: [facility];  Service: Ophthalmology;  Laterality: Right;  11.63 1:25.0 13.7%   DILATION AND CURETTAGE OF UTERUS     INCISION AND DRAINAGE OF WOUND Right 03/1998   leg; it was like a boil (12/01/2013)   INSERT / REPLACE / REMOVE PACEMAKER  2008; 12/01/2013   MDT ADDRL1 pacemaker - gen change by Dr Fernande 12/01/2013   LUNG BIOPSY  2010   unc   PERMANENT PACEMAKER GENERATOR CHANGE N/A 12/01/2013   Procedure: PERMANENT PACEMAKER GENERATOR CHANGE;  Surgeon: Elspeth JAYSON Fernande, MD;  Location: [facility] CATH LAB;  Service: Cardiovascular;  Laterality: N/A;   RIGHT HEART CATH N/A 10/23/2023   Procedure: RIGHT HEART CATH;  Surgeon: Gardenia Led, DO;  Location: [facility] INVASIVE CV LAB;  Service: Cardiovascular;  Laterality: N/A;   VAGINAL HYSTERECTOMY  1970     Outpatient Encounter Medications as of 06/27/2024  Medication Sig   acetaminophen  (TYLENOL ) 500 MG tablet Take 1,000 mg by mouth every 6 (six) hours as needed for moderate pain.    allopurinol  (ZYLOPRIM ) 100 MG tablet Take 1 tablet (100 mg total) by mouth daily.   apixaban  (ELIQUIS ) 2.5 MG TABS tablet Take 1 tablet (2.5 mg total) by mouth 2 (two) times daily.   atorvastatin  (LIPITOR) 20 MG tablet TAKE 1 TABLET (20 MG TOTAL) BY MOUTH DAILY.   Blood Glucose Monitoring Suppl (FIFTY50 GLUCOSE METER 2.0) w/Device KIT    carvedilol  (COREG ) 12.5 MG tablet Take 12.5 mg by mouth 2 (two) times daily with a meal.   ferrous sulfate  325 (65 FE) MG EC tablet Take 325 mg by mouth daily.    glipiZIDE  (GLUCOTROL ) 10 MG tablet Take 5 mg by mouth daily.   Glycerin , Laxative, (FLEET LIQUID GLYCERIN  SUPP RE) Place 1 Dose rectally daily as needed (constipation).   mometasone  (ELOCON ) 0.1 % cream Apply to affected area daily prn.   mometasone   (ELOCON ) 0.1 % cream Apply topically.   Multiple Vitamins-Minerals (CENTRUM SILVER PO) Take 1 tablet by mouth daily.    OneTouch Delica Lancets 33G MISC    ONETOUCH VERIO test strip ONCE DAILY USE TO TEST BLOOD SUGAR ONCE DAILY E11.22   pantoprazole  (PROTONIX ) 40 MG tablet Take 1 tablet (40 mg total) by mouth daily.   polyethylene glycol (MIRALAX  / GLYCOLAX ) packet Take 17 g by mouth daily as needed for moderate constipation.   sitaGLIPtin  (JANUVIA ) 25 MG tablet Take 1 tablet (25 mg total) by mouth daily.   spironolactone (ALDACTONE) 25 MG tablet Take 25 mg by mouth  once.   torsemide  (DEMADEX ) 20 MG tablet Take 3 tablets (60 mg total) by mouth daily.   White Petrolatum-Mineral Oil (ARTIFICIAL TEARS) ointment Place 1 drop into both eyes daily as needed (Dry eyes).   calcitRIOL  (ROCALTROL ) 0.25 MCG capsule TAKE 1 CAPSULE (0.25 MCG TOTAL) BY MOUTH 1 (ONE) TIME EACH DAY (Patient not taking: Reported on 06/27/2024)   fluticasone -salmeterol (ADVAIR) 100-50 MCG/ACT AEPB Inhale 1 puff into the lungs 2 (two) times daily. (Patient not taking: Reported on 06/27/2024)   losartan (COZAAR) 25 MG tablet Take 25 mg by mouth daily. (Patient not taking: Reported on 06/27/2024)   No facility-administered encounter medications on file as of 06/27/2024.     Today's Vitals   06/27/24 1019 06/27/24 1021  BP:  127/65  Pulse:  84  Resp:  18  SpO2:  100%  Weight:  214 lb (97.1 kg)  PainSc: 0-No pain 0-No pain   Body mass index is 37.91 kg/m.   ECOG PERFORMANCE STATUS: 1 - Symptomatic but completely ambulatory  PHYSICAL EXAM GENERAL:alert, no distress and comfortable SKIN: no rash or ecchymoses EYES: sclera clear LUNGS: clear with normal breathing effort HEART: regular rate & rhythm ABDOMEN: abdomen soft, non-tender and normal bowel sounds NEURO: alert & oriented x 3 with fluent speech, no focal motor/sensory deficits   CBC    Latest Ref Rng & Units 06/27/2024   10:01 AM 12/28/2023   10:18 AM 12/07/2023    10:22 AM  CBC  WBC 4.0 - 10.5 K/uL 6.1  5.9  6.4   Hemoglobin 12.0 - 15.0 g/dL 9.5  89.5  89.4   Hematocrit 36.0 - 46.0 % 27.7  30.8  33.4   Platelets 150 - 400 K/uL 117  128  141       CMP     Latest Ref Rng & Units 05/23/2024    9:01 AM 05/08/2024   10:39 AM 03/14/2024   11:28 AM  CMP  Glucose 70 - 99 mg/dL 841  853  789   BUN 6 - 23 mg/dL 77  78  44   Creatinine 0.40 - 1.20 mg/dL 4.46  5.15  6.89   Sodium 135 - 145 mEq/L 137  140  141   Potassium 3.5 - 5.1 mEq/L 4.5  4.0  3.0   Chloride 96 - 112 mEq/L 97  104  99   CO2 19 - 32 mEq/L 29  26  30    Calcium  8.4 - 10.5 mg/dL 89.9  9.5  89.9   Total Protein 6.0 - 8.3 g/dL 7.2     Total Bilirubin 0.2 - 1.2 mg/dL 1.0     Alkaline Phos 39 - 117 U/L 64     AST 0 - 37 U/L 19     ALT 0 - 35 U/L 15         ASSESSMENT & PLAN: 89 year old female with PMH including DM, glaucoma, HTN, PAF, GERD, OSA, CHF, HL, CKD  Anemia -Normal iron, folate, TSH; elevated B12 -Felt to be 2/2 anemia of chronic disease  -Bone marrow condition has not been ruled out -Last epo inj 06/2022, stable in 9-11 range   Thrombocytopenia -Intermittent -Hepatitis panel and HIV negative/non-reactive; no B12 deficiency -Unremarkable liver/splen on CT Abd/pel from 06/2019 -?ITP -Mild and stable overall  Epistaxis  -1-2/month, currently bleeding now -Referral to ENT for cauterization   4. Weight loss  -Pt attributes to eating less/healthier to manage co-mobidities and diet changes related to broken denture. She is getting this fixed  Disposition:  Ms. Haubner appears well. Anemia remains stable on observation hgb 9.5. I do not recommend restarting epo injections at this time. Platelets are slightly lower today 117K, possibly contributing to epistaxis vs eliquis . I have referred her to [facility] in [place] per her request.   Will follow up the pending iron studies from today. Continue observation.   Return for lab and office visit with Dr. Melanee in 6  months, or sooner if needed.    Orders Placed This Encounter  Procedures   Ambulatory referral to ENT    Referral Priority:   Routine    Referral Type:   Consultation    Referral Reason:   Specialty Services Required    Referred to Provider:   Tobie Eldora NOVAK, MD    Requested Specialty:   Otolaryngology    Number of Visits Requested:   1      All questions were answered. The patient knows to call the clinic with any problems, questions or concerns. No barriers to learning were detected. I spent 20 minutes counseling the patient face to face. The total time spent in the appointment was 30 minutes and more than 50% was on counseling, review of test results, and coordination of care.   Delynn Olvera [place], NP 06/27/2024

## 2024-07-01 DIAGNOSIS — I129 Hypertensive chronic kidney disease with stage 1 through stage 4 chronic kidney disease, or unspecified chronic kidney disease: Secondary | ICD-10-CM | POA: Diagnosis not present

## 2024-07-01 DIAGNOSIS — I10 Essential (primary) hypertension: Secondary | ICD-10-CM | POA: Diagnosis not present

## 2024-07-01 DIAGNOSIS — N2581 Secondary hyperparathyroidism of renal origin: Secondary | ICD-10-CM | POA: Diagnosis not present

## 2024-07-01 DIAGNOSIS — N185 Chronic kidney disease, stage 5: Secondary | ICD-10-CM | POA: Diagnosis not present

## 2024-07-01 DIAGNOSIS — D631 Anemia in chronic kidney disease: Secondary | ICD-10-CM | POA: Diagnosis not present

## 2024-07-01 DIAGNOSIS — E1122 Type 2 diabetes mellitus with diabetic chronic kidney disease: Secondary | ICD-10-CM | POA: Diagnosis not present

## 2024-07-01 DIAGNOSIS — E785 Hyperlipidemia, unspecified: Secondary | ICD-10-CM | POA: Diagnosis not present

## 2024-07-12 DIAGNOSIS — G4733 Obstructive sleep apnea (adult) (pediatric): Secondary | ICD-10-CM | POA: Diagnosis not present

## 2024-07-22 DIAGNOSIS — H353221 Exudative age-related macular degeneration, left eye, with active choroidal neovascularization: Secondary | ICD-10-CM | POA: Diagnosis not present

## 2024-07-23 DIAGNOSIS — E1122 Type 2 diabetes mellitus with diabetic chronic kidney disease: Secondary | ICD-10-CM | POA: Diagnosis not present

## 2024-07-23 DIAGNOSIS — I10 Essential (primary) hypertension: Secondary | ICD-10-CM | POA: Diagnosis not present

## 2024-07-23 DIAGNOSIS — N2581 Secondary hyperparathyroidism of renal origin: Secondary | ICD-10-CM | POA: Diagnosis not present

## 2024-07-23 DIAGNOSIS — E785 Hyperlipidemia, unspecified: Secondary | ICD-10-CM | POA: Diagnosis not present

## 2024-07-23 DIAGNOSIS — D631 Anemia in chronic kidney disease: Secondary | ICD-10-CM | POA: Diagnosis not present

## 2024-07-23 DIAGNOSIS — N185 Chronic kidney disease, stage 5: Secondary | ICD-10-CM | POA: Diagnosis not present

## 2024-07-23 DIAGNOSIS — I129 Hypertensive chronic kidney disease with stage 1 through stage 4 chronic kidney disease, or unspecified chronic kidney disease: Secondary | ICD-10-CM | POA: Diagnosis not present

## 2024-07-30 DIAGNOSIS — I509 Heart failure, unspecified: Secondary | ICD-10-CM | POA: Diagnosis not present

## 2024-07-30 DIAGNOSIS — I129 Hypertensive chronic kidney disease with stage 1 through stage 4 chronic kidney disease, or unspecified chronic kidney disease: Secondary | ICD-10-CM | POA: Diagnosis not present

## 2024-07-30 DIAGNOSIS — N2581 Secondary hyperparathyroidism of renal origin: Secondary | ICD-10-CM | POA: Diagnosis not present

## 2024-07-30 DIAGNOSIS — D631 Anemia in chronic kidney disease: Secondary | ICD-10-CM | POA: Diagnosis not present

## 2024-07-30 DIAGNOSIS — E785 Hyperlipidemia, unspecified: Secondary | ICD-10-CM | POA: Diagnosis not present

## 2024-07-30 DIAGNOSIS — N185 Chronic kidney disease, stage 5: Secondary | ICD-10-CM | POA: Diagnosis not present

## 2024-07-30 DIAGNOSIS — E1122 Type 2 diabetes mellitus with diabetic chronic kidney disease: Secondary | ICD-10-CM | POA: Diagnosis not present

## 2024-07-31 ENCOUNTER — Telehealth: Payer: Self-pay | Admitting: Cardiology

## 2024-07-31 NOTE — Telephone Encounter (Signed)
 Called to sched recall

## 2024-08-12 ENCOUNTER — Telehealth: Payer: Self-pay | Admitting: Cardiology

## 2024-08-12 NOTE — Telephone Encounter (Signed)
 Called to confirm/remind patient of their appointment at [facility] on 08/13/24.   Appointment:   [] Confirmed  [x] Left mess   [] No answer/No voice mail  [] VM Full/unable to leave message  [] Phone not in service  Patient reminded to bring all medications and/or complete list.  Confirmed patient has transportation. Gave directions, instructed to utilize valet parking.

## 2024-08-13 ENCOUNTER — Ambulatory Visit (HOSPITAL_BASED_OUTPATIENT_CLINIC_OR_DEPARTMENT_OTHER): Admitting: Cardiology

## 2024-08-13 ENCOUNTER — Other Ambulatory Visit
Admission: RE | Admit: 2024-08-13 | Discharge: 2024-08-13 | Disposition: A | Discharge status: Home / Self Care | Source: Ambulatory Visit | Attending: Cardiology | Admitting: Cardiology

## 2024-08-13 VITALS — BP 125/54 | HR 69 | Wt 216.0 lb

## 2024-08-13 DIAGNOSIS — I480 Paroxysmal atrial fibrillation: Secondary | ICD-10-CM

## 2024-08-13 DIAGNOSIS — I5032 Chronic diastolic (congestive) heart failure: Secondary | ICD-10-CM | POA: Insufficient documentation

## 2024-08-13 DIAGNOSIS — N184 Chronic kidney disease, stage 4 (severe): Secondary | ICD-10-CM | POA: Diagnosis not present

## 2024-08-13 DIAGNOSIS — I10 Essential (primary) hypertension: Secondary | ICD-10-CM

## 2024-08-13 LAB — BASIC METABOLIC PANEL WITH GFR
Anion gap: 13 (ref 5–15)
BUN: 48 mg/dL — ABNORMAL HIGH (ref 8–23)
CO2: 28 mmol/L (ref 22–32)
Calcium: 9.2 mg/dL (ref 8.9–10.3)
Chloride: 99 mmol/L (ref 98–111)
Creatinine, Ser: 3.53 mg/dL — ABNORMAL HIGH (ref 0.44–1.00)
GFR, Estimated: 12 mL/min — ABNORMAL LOW (ref >60)
Glucose, Bld: 160 mg/dL — ABNORMAL HIGH (ref 70–99)
Potassium: 3.3 mmol/L — ABNORMAL LOW (ref 3.5–5.1)
Sodium: 140 mmol/L (ref 135–145)

## 2024-08-13 LAB — BRAIN NATRIURETIC PEPTIDE: B Natriuretic Peptide: 724.4 pg/mL — ABNORMAL HIGH (ref 0.0–100.0)

## 2024-08-13 NOTE — Progress Notes (Signed)
 ADVANCED HEART FAILURE CLINIC NOTE  Referring Physician: Glendia Shad, MD  Primary Care: Glendia Shad, MD  HPI: [name] [name] is a 89 y.o. female with HFpEF, paroxysmal atrial fibrillation, sick sinus syndrome status post permanent pacemaker, hypertension, OSA, CKD stage IV presenting today for follow up.  She establish care with heart failure in November 2024 when she was admitted with dyspnea on exertion and chest pressure.  At that time patient was found to be in atrial fibrillation with volume overload.  Echocardiogram during admission with EF of 55 to 60% but with severely elevated RVSP.  Patient was aggressively diuresed and underwent right heart catheterization which demonstrated preserved cardiac index and PA mean of 28 mmHg.  Underwent cardioversion on 12/13/23.   Interval hx:  - Reports she has been doing fairly well from a symptomatic standpoint. She was taking torsemide  80mg  daily which reportedly led to improvement in her dyspnea and chest discomfort. She decreased to 60mg  several days ago.   Current Outpatient Medications  Medication Sig Dispense Refill   acetaminophen  (TYLENOL ) 500 MG tablet Take 1,000 mg by mouth every 6 (six) hours as needed for moderate pain.      allopurinol  (ZYLOPRIM ) 100 MG tablet Take 1 tablet (100 mg total) by mouth daily. 90 tablet 1   apixaban  (ELIQUIS ) 2.5 MG TABS tablet Take 1 tablet (2.5 mg total) by mouth 2 (two) times daily. 60 tablet 5   atorvastatin  (LIPITOR) 20 MG tablet TAKE 1 TABLET (20 MG TOTAL) BY MOUTH DAILY. 90 tablet 3   Blood Glucose Monitoring Suppl (FIFTY50 GLUCOSE METER 2.0) w/Device KIT      calcitRIOL  (ROCALTROL ) 0.25 MCG capsule TAKE 1 CAPSULE (0.25 MCG TOTAL) BY MOUTH 1 (ONE) TIME EACH DAY     carvedilol  (COREG ) 12.5 MG tablet Take 12.5 mg by mouth 2 (two) times daily with a meal.     ferrous sulfate  325 (65 FE) MG EC tablet Take 325 mg by mouth daily.      fluticasone -salmeterol (ADVAIR) 100-50 MCG/ACT AEPB Inhale 1  puff into the lungs 2 (two) times daily.     glipiZIDE  (GLUCOTROL ) 10 MG tablet Take 5 mg by mouth daily.     Glycerin , Laxative, (FLEET LIQUID GLYCERIN  SUPP RE) Place 1 Dose rectally daily as needed (constipation).     losartan (COZAAR) 25 MG tablet Take 25 mg by mouth daily.     mometasone  (ELOCON ) 0.1 % cream Apply to affected area daily prn. 30 g 0   mometasone  (ELOCON ) 0.1 % cream Apply topically.     Multiple Vitamins-Minerals (CENTRUM SILVER PO) Take 1 tablet by mouth daily.      OneTouch Delica Lancets 33G MISC      ONETOUCH VERIO test strip ONCE DAILY USE TO TEST BLOOD SUGAR ONCE DAILY E11.22 100 strip 4   pantoprazole  (PROTONIX ) 40 MG tablet Take 1 tablet (40 mg total) by mouth daily. 90 tablet 1   polyethylene glycol (MIRALAX  / GLYCOLAX ) packet Take 17 g by mouth daily as needed for moderate constipation.     sitaGLIPtin  (JANUVIA ) 25 MG tablet Take 1 tablet (25 mg total) by mouth daily. 90 tablet 1   spironolactone (ALDACTONE) 25 MG tablet Take 25 mg by mouth once.     torsemide  (DEMADEX ) 20 MG tablet Take 3 tablets (60 mg total) by mouth daily.     White Petrolatum-Mineral Oil (ARTIFICIAL TEARS) ointment Place 1 drop into both eyes daily as needed (Dry eyes).     No current facility-administered medications  for this visit.   PHYSICAL EXAM:  Wt Readings from Last 3 Encounters:  08/13/24 216 lb (98 kg)  06/27/24 214 lb (97.1 kg)  06/12/24 214 lb 9.6 oz (97.3 kg)   Vitals:   08/13/24 0953  BP: (!) 125/54  Pulse: 69  SpO2: 100%   GENERAL: NAD Lungs- normal work of breathing CARDIAC:  JVP: 7 cm          Normal rate with regular rhythm. no murmur.  Pulses 2+. 1+ edema.  ABDOMEN: Soft, non-tender, non-distended.  EXTREMITIES: Warm and well perfused.  NEUROLOGIC: No obvious FND    DATA REVIEW  ECG: 11/07/23: RV paced rhythm  As per my personal interpretation  ECHO: 10/20/23: LVEF 55%-60%  as per my personal interpretation  CATH: HEMODYNAMICS: RA:                   6 mmHg (mean) RV:                  45/1-6 mmHg PA:                  45/15 mmHg (28 mean) PCWP:            13 mmHg (mean)                                      Estimated Fick CO/CI   6.3 L/min, 2.9 L/min/m2                                              TPG                 15  mmHg                                            PVR                 2.4 Wood Units  PAPi                5     ASSESSMENT & PLAN:  Chronic HFpEF - TTE w/ LVEF 60% on 10/20/23 - Unfortunately her severely reduced GFR limits her therapeutic options. Finerenone not indicated for GFR <25 (discontinue if less than 15) - NYHA II - Reports feeling well on torsemide  80mg ; she has now decreased to 60mg . Her sCr  was up to 4.36 on 07/23/24. She still has spironolactone on her med list. Will discontinue; reports she only takes it PRN.  - Continue torsemide  60mg  daily with uptitration to 80mg  for edema/weight gain.   2. HTN - Well controlled - continue losartan 25mg  daily.   3. Atrial fibrillation / flutter, paroxysmal - s/p DCCV on 10/17/23 - continue apixaban  2.5mg  daily.  - s/p DCCV in 1/25 - Regular rate today.    4. CKD IV - see above -  eGFR less than 15 - sCr stable at of 4.36 on 07/23/24 on torsemide  80mg  daily.  - Will repeat BMP today.   5. OSA - Compliant with CPAP  6. T2DM - Glipizide  stopped by PCP given hypoglycemia - Januvia  25mg  daily - A1C on 02/06/24 of 8.7%  Nieves Barberi,  DO  9:54 AM  I spent 35 minutes caring for this patient today including face to face time, ordering and reviewing labs, reviewing labs noted above, counseling on fluid intake/torsemide  dosing, seeing the patient, documenting in the record, and arranging follow ups.

## 2024-08-13 NOTE — Patient Instructions (Addendum)
 Medication Changes:  Continue taking Torsemide  60 MG once daily   Lab Work:  Go over to the [facility]. Go pass the gift shop and have your blood work completed.  We will only call you if the results are abnormal or if the provider would like to make medication changes.  No news is good news.   Follow-Up in: 2-3 months with Dr. Sabharwal.  Our Doctors' schedules are NOT open yet for 2-3 months. We will place you on our recall list. Once they are available, we will call you to schedule your follow up appointment.    Thank you for choosing  [facility].    At the [facility], you and your health needs are our priority. We have a designated team specialized in the treatment of Heart Failure. This Care Team includes your primary Heart Failure Specialized Cardiologist (physician), Advanced Practice Providers (APPs- Physician Assistants and Nurse Practitioners), and Pharmacist who all work together to provide you with the care you need, when you need it.   You may see any of the following providers on your designated Care Team at your next follow up:  Dr. Toribio Fuel Dr. Ezra Shuck Dr. Ria Commander Dr. Morene Brownie Ellouise Class, FNP Jaun Bash, RPH-CPP  Please be sure to bring in all your medications bottles to every appointment.   Need to Contact Us :  If you have any questions or concerns before your next appointment please send us  a message through [place] or call our office at [phone].    TO LEAVE A MESSAGE FOR THE NURSE SELECT OPTION 2, PLEASE LEAVE A MESSAGE INCLUDING: YOUR NAME DATE OF BIRTH CALL BACK NUMBER REASON FOR CALL**this is important as we prioritize the call backs  YOU WILL RECEIVE A CALL BACK THE SAME DAY AS LONG AS YOU CALL BEFORE 4:00 PM

## 2024-08-13 NOTE — Addendum Note (Signed)
 Addended by: Kiyoshi Schaab A on: 08/13/2024 10:17 AM   Modules accepted: Orders

## 2024-08-13 NOTE — Addendum Note (Signed)
 Addended by: Kinslee Dalpe A on: 08/13/2024 10:31 AM   Modules accepted: Orders

## 2024-08-20 ENCOUNTER — Telehealth: Payer: Self-pay | Admitting: Internal Medicine

## 2024-08-20 DIAGNOSIS — E042 Nontoxic multinodular goiter: Secondary | ICD-10-CM

## 2024-08-20 DIAGNOSIS — E782 Mixed hyperlipidemia: Secondary | ICD-10-CM

## 2024-08-20 DIAGNOSIS — E785 Hyperlipidemia, unspecified: Secondary | ICD-10-CM

## 2024-08-20 DIAGNOSIS — I5032 Chronic diastolic (congestive) heart failure: Secondary | ICD-10-CM

## 2024-08-20 DIAGNOSIS — I4891 Unspecified atrial fibrillation: Secondary | ICD-10-CM

## 2024-08-20 DIAGNOSIS — D631 Anemia in chronic kidney disease: Secondary | ICD-10-CM

## 2024-08-20 DIAGNOSIS — N185 Chronic kidney disease, stage 5: Secondary | ICD-10-CM

## 2024-08-20 DIAGNOSIS — E1122 Type 2 diabetes mellitus with diabetic chronic kidney disease: Secondary | ICD-10-CM

## 2024-08-20 DIAGNOSIS — E7800 Pure hypercholesterolemia, unspecified: Secondary | ICD-10-CM

## 2024-08-20 DIAGNOSIS — I10 Essential (primary) hypertension: Secondary | ICD-10-CM

## 2024-08-20 NOTE — Telephone Encounter (Signed)
 Lab order needed

## 2024-08-20 NOTE — Telephone Encounter (Signed)
Orders placed for f/u labs.  

## 2024-08-29 ENCOUNTER — Telehealth: Payer: Self-pay | Admitting: *Deleted

## 2024-08-29 ENCOUNTER — Other Ambulatory Visit

## 2024-08-29 DIAGNOSIS — E042 Nontoxic multinodular goiter: Secondary | ICD-10-CM

## 2024-08-29 DIAGNOSIS — E1122 Type 2 diabetes mellitus with diabetic chronic kidney disease: Secondary | ICD-10-CM

## 2024-08-29 DIAGNOSIS — I5032 Chronic diastolic (congestive) heart failure: Secondary | ICD-10-CM | POA: Diagnosis not present

## 2024-08-29 DIAGNOSIS — N185 Chronic kidney disease, stage 5: Secondary | ICD-10-CM | POA: Diagnosis not present

## 2024-08-29 DIAGNOSIS — E7800 Pure hypercholesterolemia, unspecified: Secondary | ICD-10-CM | POA: Diagnosis not present

## 2024-08-29 LAB — LIPID PANEL
Cholesterol: 131 mg/dL (ref 0–200)
HDL: 45 mg/dL (ref >39.00)
LDL Cholesterol: 70 mg/dL (ref 0–99)
NonHDL: 86.27
Total CHOL/HDL Ratio: 3
Triglycerides: 79 mg/dL (ref 0.0–149.0)
VLDL: 15.8 mg/dL (ref 0.0–40.0)

## 2024-08-29 LAB — HEPATIC FUNCTION PANEL
ALT: 26 U/L (ref 0–35)
AST: 25 U/L (ref 0–37)
Albumin: 3.9 g/dL (ref 3.5–5.2)
Alkaline Phosphatase: 96 U/L (ref 39–117)
Bilirubin, Direct: 0.3 mg/dL (ref 0.0–0.3)
Total Bilirubin: 0.8 mg/dL (ref 0.2–1.2)
Total Protein: 6.2 g/dL (ref 6.0–8.3)

## 2024-08-29 LAB — BASIC METABOLIC PANEL WITH GFR
BUN: 51 mg/dL — ABNORMAL HIGH (ref 6–23)
CO2: 32 meq/L (ref 19–32)
Calcium: 9.6 mg/dL (ref 8.4–10.5)
Chloride: 103 meq/L (ref 96–112)
Creatinine, Ser: 3.58 mg/dL — ABNORMAL HIGH (ref 0.40–1.20)
GFR: 11.02 mL/min — CL (ref >60.00)
Glucose, Bld: 169 mg/dL — ABNORMAL HIGH (ref 70–99)
Potassium: 3.6 meq/L (ref 3.5–5.1)
Sodium: 145 meq/L (ref 135–145)

## 2024-08-29 LAB — HEMOGLOBIN A1C: Hgb A1c MFr Bld: 7.7 % — ABNORMAL HIGH (ref 4.6–6.5)

## 2024-08-29 LAB — TSH: TSH: 1.01 u[IU]/mL (ref 0.35–5.50)

## 2024-08-29 NOTE — Telephone Encounter (Signed)
 CRITICAL VALUE STICKER  CRITICAL VALUE: GFR-11.02  RECEIVER (on-site recipient of call): Sharene ORN, CMA  DATE & TIME NOTIFIED: 9/26 @ 11:53am  MESSENGER (representative from lab): Saa  MD NOTIFIED: Dr Allena Hamilton  TIME OF NOTIFICATION: 11:57am  RESPONSE:

## 2024-08-30 ENCOUNTER — Ambulatory Visit: Payer: Self-pay | Admitting: Internal Medicine

## 2024-08-30 NOTE — Telephone Encounter (Signed)
 Has been seening nephrology. Declines dialysis.

## 2024-09-02 DIAGNOSIS — H353221 Exudative age-related macular degeneration, left eye, with active choroidal neovascularization: Secondary | ICD-10-CM | POA: Diagnosis not present

## 2024-09-03 ENCOUNTER — Ambulatory Visit: Payer: Self-pay | Admitting: Internal Medicine

## 2024-09-03 ENCOUNTER — Ambulatory Visit: Admitting: Internal Medicine

## 2024-09-03 VITALS — BP 126/72 | HR 88 | Resp 16 | Ht 64.0 in | Wt 217.6 lb

## 2024-09-03 DIAGNOSIS — I10 Essential (primary) hypertension: Secondary | ICD-10-CM

## 2024-09-03 DIAGNOSIS — D631 Anemia in chronic kidney disease: Secondary | ICD-10-CM

## 2024-09-03 DIAGNOSIS — N184 Chronic kidney disease, stage 4 (severe): Secondary | ICD-10-CM

## 2024-09-03 DIAGNOSIS — R0602 Shortness of breath: Secondary | ICD-10-CM | POA: Diagnosis not present

## 2024-09-03 DIAGNOSIS — I272 Pulmonary hypertension, unspecified: Secondary | ICD-10-CM | POA: Diagnosis not present

## 2024-09-03 DIAGNOSIS — E1122 Type 2 diabetes mellitus with diabetic chronic kidney disease: Secondary | ICD-10-CM | POA: Diagnosis not present

## 2024-09-03 DIAGNOSIS — R1310 Dysphagia, unspecified: Secondary | ICD-10-CM | POA: Diagnosis not present

## 2024-09-03 DIAGNOSIS — E7800 Pure hypercholesterolemia, unspecified: Secondary | ICD-10-CM

## 2024-09-03 DIAGNOSIS — Z23 Encounter for immunization: Secondary | ICD-10-CM | POA: Diagnosis not present

## 2024-09-03 DIAGNOSIS — I4891 Unspecified atrial fibrillation: Secondary | ICD-10-CM

## 2024-09-03 DIAGNOSIS — E782 Mixed hyperlipidemia: Secondary | ICD-10-CM | POA: Diagnosis not present

## 2024-09-03 DIAGNOSIS — I480 Paroxysmal atrial fibrillation: Secondary | ICD-10-CM

## 2024-09-03 DIAGNOSIS — K219 Gastro-esophageal reflux disease without esophagitis: Secondary | ICD-10-CM | POA: Diagnosis not present

## 2024-09-03 DIAGNOSIS — N185 Chronic kidney disease, stage 5: Secondary | ICD-10-CM

## 2024-09-03 DIAGNOSIS — I5032 Chronic diastolic (congestive) heart failure: Secondary | ICD-10-CM | POA: Diagnosis not present

## 2024-09-03 DIAGNOSIS — G4733 Obstructive sleep apnea (adult) (pediatric): Secondary | ICD-10-CM

## 2024-09-03 MED ORDER — ALLOPURINOL 100 MG PO TABS: 100.0000 mg | ORAL_TABLET | Freq: Every day | ORAL | 1 refills | Status: DC

## 2024-09-03 MED ORDER — PANTOPRAZOLE SODIUM 40 MG PO TBEC: 40.0000 mg | DELAYED_RELEASE_TABLET | Freq: Every day | ORAL | 1 refills | Status: DC

## 2024-09-03 NOTE — Patient Instructions (Signed)
 Increase torsemide  to 80mg  per day x 3 days and then decrease back to 60mg  per day.   Continue to weigh daily  Update us  on symptoms.

## 2024-09-03 NOTE — Progress Notes (Unsigned)
 Subjective:    Patient ID: [name] [name], female    DOB: 04-29-1935, 89 y.o.   MRN: [id number]  Patient here for  Chief Complaint  Patient presents with  . Annual Exam    HPI Here for a physical exam. Saw cardiology 08/13/24 - f/u chronic HFpEF with TTE - LVEF 60% on 10/20/23. Taking torsemide  - she had decreased herself to 60mg  q day several days before that appt. Recommended to continue 60mg  torsemide  with plans to uptitrate to 80mg  for edema/weight gain.  S/p DCCV 12/2023. Continue cpap. Reviewed recent labs. A1c 7.7. saw Dr Jaclynn 07/30/24 - f/u CKD V. Recommended to hold losartan and spironolactone. Not interested in dialysis. Saw hematology 06/27/24 - f/u anemia felt to be secondary to anemia of chronic disease. Last epo injection 06/2022. Stable in 9-11 range. Platelet count - stable. Was referred to ENT for recurring nose bleeds. Had f/u with pulmonary 06/12/24 - continues CPAP.    Past Medical History:  Diagnosis Date  . 1st degree AV block    a. PR 400 msec with Apacing  . A-fib (HCC)    Hx of  . Anemia   . Anxiety   . Arthritis    knees and lower back   . Chest pain    a. 2004 Cath:  reportedly nl;  b. 09/2013 Neg MV; c. 11/2017 nl ETT; d. 08/2023 MV: No isch/infarct. nl LV fxn. Mild Ao Ca2+.  . Chronic diastolic heart failure (HCC)    a. 09/2013 Echo: EF 50-55%, mild LVH, mild to mod MR/TR; b. 04/2023 Echo: EF 55-60%, no rwma, nl RV size/fxn, RVSP 44.43mmHg. Mod dil LA. Mod MR/TR.  . CKD (chronic kidney disease), stage IV (HCC)   . Colon polyps   . Congestive heart disease (HCC)   . Esophageal reflux   . Glaucoma   . Gout   . H/O hiatal hernia   . History of UTI   . Hyperlipidemia   . Moderate mitral regurgitation    a. 04/2023 Echo: Mod MR.  . Morbid obesity (HCC)   . OSA (obstructive sleep apnea)   . OSA on CPAP    CPAP  . Pacemaker    copy of medtronic card in chart  . PAF (paroxysmal atrial fibrillation) (HCC)    a. 10/2023 s/p DCCV (150J); b. CHA2DS2VASc =  5-->warfarin.  . Primary hypertension    controlled on meds  . Pulmonary hypertension (HCC)    a. 04/2023 Echo: RVSP 44.53mmHg.  . Seasonal allergies   . SSS (sick sinus syndrome) (HCC)    a. 2008 s/p MDT PPM;  b. 11/2013 Gen change- MDT ADDRL1 Adapta DC PPM, ser # [id number].  . Symptomatic PVCs   . Syncope and collapse    a. Felt to be vasovagal.  . Thyroid  disease    nodules on thyroid , with goiter, on no meds per pt  . Tricuspid regurgitation   . Type II diabetes mellitus (HCC)    Type 2  . Unspecified glaucoma(365.9)   . Vertigo    in past  . Wears dentures    upper full and lower partial   Past Surgical History:  Procedure Laterality Date  . APPENDECTOMY    . CARDIAC CATHETERIZATION    . CARDIOVERSION N/A 04/26/2017   Procedure: Cardioversion;  Surgeon: Perla Evalene PARAS, MD;  Location: [facility];  Service: Cardiovascular;  Laterality: N/A;  . CARDIOVERSION N/A 10/17/2023   Procedure: CARDIOVERSION;  Surgeon: Perla Evalene PARAS, MD;  Location: [facility];  Service: Cardiovascular;  Laterality: N/A;  . CARDIOVERSION N/A 12/13/2023   Procedure: CARDIOVERSION;  Surgeon: Perla Evalene PARAS, MD;  Location: [facility];  Service: Cardiovascular;  Laterality: N/A;  . CATARACT EXTRACTION W/PHACO Left 07/21/2020   Procedure: CATARACT EXTRACTION PHACO AND INTRAOCULAR LENS PLACEMENT (IOC) LEFT DIABETIC;  Surgeon: Mittie Gaskin, MD;  Location: [facility];  Service: Ophthalmology;  Laterality: Left;  10.25 1:13.2 14.0%  . CATARACT EXTRACTION W/PHACO Right 08/11/2020   Procedure: CATARACT EXTRACTION PHACO AND INTRAOCULAR LENS PLACEMENT (IOC) RIGHT DIABETIC;  Surgeon: Mittie Gaskin, MD;  Location: [facility];  Service: Ophthalmology;  Laterality: Right;  11.63 1:25.0 13.7%  . DILATION AND CURETTAGE OF UTERUS    . INCISION AND DRAINAGE OF WOUND Right 03/1998   leg; it was like a boil (12/01/2013)  . INSERT / REPLACE / REMOVE PACEMAKER  2008; 12/01/2013   MDT ADDRL1  pacemaker - gen change by Dr Fernande 12/01/2013  . LUNG BIOPSY  2010   [facility]  . PERMANENT PACEMAKER GENERATOR CHANGE N/A 12/01/2013   Procedure: PERMANENT PACEMAKER GENERATOR CHANGE;  Surgeon: Elspeth JAYSON Fernande, MD;  Location: [facility];  Service: Cardiovascular;  Laterality: N/A;  . RIGHT HEART CATH N/A 10/23/2023   Procedure: RIGHT HEART CATH;  Surgeon: Gardenia Led, DO;  Location: [facility];  Service: Cardiovascular;  Laterality: N/A;  . VAGINAL HYSTERECTOMY  1970   Family History  Problem Relation Age of Onset  . Cancer Mother   . Liver cancer Mother   . Pancreatic cancer Mother   . Heart disease Father   . Rheum arthritis Father   . Allergies Father   . Hypertension Sister   . Rheum arthritis Sister   . COPD Brother   . Breast cancer Maternal Aunt    Social History   Socioeconomic History  . Marital status: Widowed    Spouse name: Not on file  . Number of children: 2  . Years of education: Not on file  . Highest education level: 12th grade  Occupational History  . Occupation: retired  Tobacco Use  . Smoking status: Former    Current packs/day: 0.00    Average packs/day: 0.5 packs/day for 34.0 years (17.0 ttl pk-yrs)    Types: Cigarettes    Start date: 08/16/1955    Quit date: 08/15/1989    Years since quitting: 35.0  . Smokeless tobacco: Never  Vaping Use  . Vaping status: Never Used  Substance and Sexual Activity  . Alcohol use: No    Alcohol/week: 0.0 standard drinks of alcohol  . Drug use: No  . Sexual activity: Never  Other Topics Concern  . Not on file  Social History Narrative   Retired. Widowed. Regularly exercises.    Social Drivers of Corporate investment banker Strain: Low Risk  (01/07/2024)   Overall Financial Resource Strain (CARDIA)   . Difficulty of Paying Living Expenses: Not hard at all  Food Insecurity: No Food Insecurity (01/07/2024)   Hunger Vital Sign   . Worried About Programme researcher, broadcasting/film/video in the Last Year: Never true   . Ran  Out of Food in the Last Year: Never true  Transportation Needs: No Transportation Needs (01/07/2024)   PRAPARE - Transportation   . Lack of Transportation (Medical): No   . Lack of Transportation (Non-Medical): No  Physical Activity: Sufficiently Active (01/07/2024)   Exercise Vital Sign   . Days of Exercise per Week: 3 days   . Minutes of Exercise per Session:  60 min  Stress: No Stress Concern Present (01/07/2024)   [employer] of Occupational Health - Occupational Stress Questionnaire   . Feeling of Stress : Not at all  Social Connections: Moderately Integrated (01/07/2024)   Social Connection and Isolation Panel   . Frequency of Communication with Friends and Family: More than three times a week   . Frequency of Social Gatherings with Friends and Family: More than three times a week   . Attends Religious Services: More than 4 times per year   . Active Member of Clubs or Organizations: Yes   . Attends Banker Meetings: More than 4 times per year   . Marital Status: Widowed     Review of Systems     Objective:     BP 126/72   Pulse 88   Resp 16   Ht 5' 4 (1.626 m)   Wt 217 lb 9.6 oz (98.7 kg)   SpO2 98%   BMI 37.35 kg/m  Wt Readings from Last 3 Encounters:  09/03/24 217 lb 9.6 oz (98.7 kg)  08/13/24 216 lb (98 kg)  06/27/24 214 lb (97.1 kg)    Physical Exam  {Perform Simple Foot Exam  Perform Detailed exam:1} {Insert foot Exam (Optional):30965}   Outpatient Encounter Medications as of 09/03/2024  Medication Sig  . calcitRIOL  (ROCALTROL ) 0.25 MCG capsule Take 0.25 mcg by mouth. Take 1 capsule (0.25 mcg total) by mouth 3 times weekly: Mon Wed and Friday morning  . acetaminophen  (TYLENOL ) 500 MG tablet Take 1,000 mg by mouth every 6 (six) hours as needed for moderate pain.   . allopurinol  (ZYLOPRIM ) 100 MG tablet Take 1 tablet (100 mg total) by mouth daily.  . apixaban  (ELIQUIS ) 2.5 MG TABS tablet Take 1 tablet (2.5 mg total) by mouth 2 (two) times  daily.  . atorvastatin  (LIPITOR) 20 MG tablet TAKE 1 TABLET (20 MG TOTAL) BY MOUTH DAILY.  SABRA Blood Glucose Monitoring Suppl (FIFTY50 GLUCOSE METER 2.0) w/Device KIT   . carvedilol  (COREG ) 12.5 MG tablet Take 12.5 mg by mouth 2 (two) times daily with a meal.  . ferrous sulfate  325 (65 FE) MG EC tablet Take 325 mg by mouth daily.   . glipiZIDE  (GLUCOTROL ) 10 MG tablet Take 5 mg by mouth daily.  . Glycerin , Laxative, (FLEET LIQUID GLYCERIN  SUPP RE) Place 1 Dose rectally daily as needed (constipation).  . mometasone  (ELOCON ) 0.1 % cream Apply to affected area daily prn.  . mometasone  (ELOCON ) 0.1 % cream Apply topically.  . Multiple Vitamins-Minerals (CENTRUM SILVER PO) Take 1 tablet by mouth daily.   SABRA Jan Delica Lancets 33G MISC   . ONETOUCH VERIO test strip ONCE DAILY USE TO TEST BLOOD SUGAR ONCE DAILY E11.22  . pantoprazole  (PROTONIX ) 40 MG tablet Take 1 tablet (40 mg total) by mouth daily.  . polyethylene glycol (MIRALAX  / GLYCOLAX ) packet Take 17 g by mouth daily as needed for moderate constipation.  . sitaGLIPtin  (JANUVIA ) 25 MG tablet Take 1 tablet (25 mg total) by mouth daily.  . torsemide  (DEMADEX ) 20 MG tablet Take 3 tablets (60 mg total) by mouth daily.  . White Petrolatum-Mineral Oil (ARTIFICIAL TEARS) ointment Place 1 drop into both eyes daily as needed (Dry eyes).  . [DISCONTINUED] allopurinol  (ZYLOPRIM ) 100 MG tablet Take 1 tablet (100 mg total) by mouth daily.  . [DISCONTINUED] calcitRIOL  (ROCALTROL ) 0.25 MCG capsule TAKE 1 CAPSULE (0.25 MCG TOTAL) BY MOUTH 1 (ONE) TIME EACH DAY  . [DISCONTINUED] fluticasone -salmeterol (ADVAIR) 100-50 MCG/ACT AEPB Inhale  1 puff into the lungs 2 (two) times daily.  . [DISCONTINUED] losartan (COZAAR) 25 MG tablet Take 25 mg by mouth daily.  . [DISCONTINUED] pantoprazole  (PROTONIX ) 40 MG tablet Take 1 tablet (40 mg total) by mouth daily.   No facility-administered encounter medications on file as of 09/03/2024.     Lab Results  Component  Value Date   WBC 6.1 06/27/2024   HGB 9.5 (L) 06/27/2024   HCT 27.7 (L) 06/27/2024   PLT 117 (L) 06/27/2024   GLUCOSE 169 (H) 08/29/2024   CHOL 131 08/29/2024   TRIG 79.0 08/29/2024   HDL 45.00 08/29/2024   LDLCALC 70 08/29/2024   ALT 26 08/29/2024   AST 25 08/29/2024   NA 145 08/29/2024   K 3.6 08/29/2024   CL 103 08/29/2024   CREATININE 3.58 (H) 08/29/2024   BUN 51 (H) 08/29/2024   CO2 32 08/29/2024   TSH 1.01 08/29/2024   INR 1.8 (H) 11/09/2023   HGBA1C 7.7 (H) 08/29/2024    No results found.     Assessment & Plan:  SOB (shortness of breath) -     EKG 12-Lead  Pure hypercholesterolemia -     Lipid panel; Future -     Hepatic function panel; Future  Type 2 diabetes mellitus with stage 5 chronic kidney disease not on chronic dialysis, without long-term current use of insulin  (HCC) -     Hemoglobin A1c; Future  Chronic diastolic heart failure (HCC) -     Basic metabolic panel with GFR; Future  Gastro-esophageal reflux disease without esophagitis -     Pantoprazole  Sodium; Take 1 tablet (40 mg total) by mouth daily.  Dispense: 90 tablet; Refill: 1  Other orders -     Allopurinol ; Take 1 tablet (100 mg total) by mouth daily.  Dispense: 90 tablet; Refill: 1 -     Flu vaccine HIGH DOSE PF(Fluzone Trivalent)     Allena Hamilton, MD

## 2024-09-03 NOTE — Telephone Encounter (Signed)
 Copied from CRM #[id number]. Topic: General - Other >> Sep 03, 2024 11:31 AM Pinkey ORN wrote: Reason for CRM: Lab Report >> Sep 03, 2024 11:32 AM Pinkey ORN wrote: Patient is requesting a printed copy of today's lab report / visit be mailed to her home address.

## 2024-09-05 ENCOUNTER — Ambulatory Visit: Admitting: Podiatry

## 2024-09-05 DIAGNOSIS — B351 Tinea unguium: Secondary | ICD-10-CM | POA: Diagnosis not present

## 2024-09-05 DIAGNOSIS — M79674 Pain in right toe(s): Secondary | ICD-10-CM | POA: Diagnosis not present

## 2024-09-05 DIAGNOSIS — M79675 Pain in left toe(s): Secondary | ICD-10-CM | POA: Diagnosis not present

## 2024-09-05 DIAGNOSIS — E119 Type 2 diabetes mellitus without complications: Secondary | ICD-10-CM | POA: Diagnosis not present

## 2024-09-08 ENCOUNTER — Encounter: Payer: Self-pay | Admitting: Internal Medicine

## 2024-09-08 DIAGNOSIS — R1310 Dysphagia, unspecified: Secondary | ICD-10-CM | POA: Insufficient documentation

## 2024-09-08 NOTE — Assessment & Plan Note (Signed)
 Continue f/u with cardiology/pulmonary. Continue cpap.

## 2024-09-08 NOTE — Assessment & Plan Note (Signed)
 Chest pressure and coughing with swallowing/eating as outlined. Plan swallowing evaluation.

## 2024-09-08 NOTE — Assessment & Plan Note (Signed)
 Continue lipitor.  Low cholesterol diet and exercise.  Follow lipid panel and liver function tests. No changes today.

## 2024-09-08 NOTE — Assessment & Plan Note (Signed)
 Continue cpap.

## 2024-09-08 NOTE — Assessment & Plan Note (Signed)
 Has been seeing Dr Cherilyn for her diabetes and f/u multinodular goiter. She was previously having some intermittent low sugars.  She restarted glipizide .  We discussed remaining off this medication, given the low sugars and her renal function. She did have pancreatitis in the past, so we had discussed avoiding GLP - 1 agonist. On discussion today, states had recurring problems with pancreatitis in the 80s. Given her renal function, she is unable to use SGLT-2 and metformin . Last A1c increased 7.7. Discussed. Continue low carb diet and exercise. Taking januvia . Follow met b and A1c.

## 2024-09-08 NOTE — Assessment & Plan Note (Signed)
 Previous work up, she underwent an echocardiogram as outpatient in May of this year that showed an EF of 55 to 60%, moderate mitral valve regurgitation, mildly elevated pulmonary arterial pressure and moderate tricuspid regurgitation.  Which was not changed from prior studies. She also underwent PFTs that showed mild restriction with decreased TLC at 76% of predicted.  Mild restrictive pattern on spirometry without obstruction however severely reduced DLCO of 22% of predicted not corrected to hemoglobin. HRCT did not show any signs of Amiodarone  pulmonary toxicity.  Noticed recently some worsening sob. Weight is up some. Discussed monitoring sodium intake. Increase torsemide  to 80mg  q day x 3 days and then resume 60mg  q day torsemide . Weight daily. Call with update. Plan f/u with cardiology.

## 2024-09-08 NOTE — Assessment & Plan Note (Signed)
 Admitted 10/19/23 - 10/23/23 - after presenting with sob and chest pain. Was s/p cardioversion for aflutter 10/17/23. Noticed worsening sob 11/14. Cardiology consulted. Diuresed. Echo EF 55-60, normal LV fxn and no RWMA, indeterminate diastolic, severely elevated pulmonary artery pressure. Lasix held secondary to acute kidney injury. Required oxygen initially. Weaned off. Renal function improved and oral diuretics resumed. Back in aflutter 10/21/23.  Is s/p DCCV 12/2023. Amiodarone was stopped. Continues on coreg 25mg  in am and 50mg  in pm. Continue eliquis. Continue f/u with cardiology.

## 2024-09-08 NOTE — Assessment & Plan Note (Signed)
 GFR on recent labs 11. Continue f/u with nephrology. Discussed. Declines dialysis.

## 2024-09-08 NOTE — Assessment & Plan Note (Signed)
 Continues on amiodarone . On eliquis .  Heart rate controlled.TSH 08/29/24 - wnl.

## 2024-09-08 NOTE — Assessment & Plan Note (Signed)
 Saw hematology 06/27/24 - f/u anemia felt to be secondary to anemia of chronic disease. Last epo injection 06/2022. Stable in 9-11 range. Platelet count - stable

## 2024-09-08 NOTE — Assessment & Plan Note (Signed)
 Continues on carvedilol . Also on torsemide  and losartan.  Adjust torsemide  dosing as outlined. Follow pressures.

## 2024-09-08 NOTE — Assessment & Plan Note (Signed)
 Is s/p DCCV 12/2023. Amiodarone  was stopped. Continues on coreg . Continue eliquis . Continues f/u with cardiology. Previous Echocardiogram during admission with EF of 55 to 60% but with severely elevated RVSP. Patient was aggressively diuresed and underwent right heart catheterization which demonstrated preserved cardiac index and PA mean of 28 mmHg. S/p cardioversion 12/13/23. Evaluated 02/28/24 - increased sob and edema. Lasix  d/c'd and was started on torsemide  80mg  daily for 5 days.  Nephrology added losartan. Currently on torsemide  60mg  q day. GFR 11 - recent labs. Reports noticing some increased sob with exertion. Weight is up some. She feels is related to eating more. Given increased sob and increased weight, will have her increase torsemide  to 80mg  q day x 3 days and then resume 60mg  q day. Call with update. EKG - LBBB. Plan f/u with cardiology.

## 2024-09-08 NOTE — Progress Notes (Signed)
 Remote PPM Transmission

## 2024-09-09 ENCOUNTER — Encounter: Payer: Self-pay | Admitting: Podiatry

## 2024-09-09 NOTE — Progress Notes (Signed)
 Subjective:  Patient ID: [name] [name], female    DOB: Apr 29, 1935,  MRN: [id number]  [name] [name] presents to clinic today for preventative diabetic foot care and callus(es) of both feet and painful mycotic toenails that are difficult to trim. Painful toenails interfere with ambulation. Aggravating factors include wearing enclosed shoe gear. Pain is relieved with periodic professional debridement. Painful calluses are aggravated when weightbearing with and without shoegear. Pain is relieved with periodic professional debridement.  Chief Complaint  Patient presents with   Toe Pain     Diabetic RFC.   PCP is Dr. Allena Hamilton. A1c 7.7   New problem(s): None.   PCP is Hamilton Allena, MD.  Allergies  Allergen Reactions   Metoprolol Other (See Comments)    Bad dreams Patient stated that she had suicidal thoughts while taking this medication.    Diltiazem  Other (See Comments)    Patient stated that she had bad dreams with this medication   Levofloxacin Nausea And Vomiting and Nausea Only         Review of Systems: Negative except as noted in the HPI.  Objective:  There were no vitals filed for this visit. [name] [name] is a pleasant 89 y.o. female in NAD. AAO x 3.  Vascular Examination: Capillary refill time immediate b/l. Palpable pedal pulses. Pedal hair present b/l. Pedal edema +1 b/l. No pain with calf compression b/l. Skin temperature gradient WNL b/l. No cyanosis or clubbing b/l. No ischemia or gangrene noted b/l.   Neurological Examination: Sensation grossly intact b/l with 10 gram monofilament. Vibratory sensation intact b/l.  Dermatological Examination: Pedal skin with normal turgor, texture and tone b/l.  No open wounds. No interdigital macerations.   Toenails 1-5 b/l thick, discolored, elongated with subungual debris and pain on dorsal palpation.   No hyperkeratotic nor porokeratotic lesions.  Musculoskeletal Examination: Muscle strength 5/5 to all lower extremity  muscle groups bilaterally. No pain, crepitus or joint limitation noted with ROM b/l LE. Pes planus deformity noted bilateral LE.[name] [name] Patient ambulates with cane assistance.  Radiographs: None  Last A1c:      Latest Ref Rng & Units 08/29/2024    8:41 AM 05/23/2024    9:01 AM 02/06/2024    7:52 AM 09/26/2023   12:00 AM  Hemoglobin A1C  Hemoglobin-A1c 4.6 - 6.5 % 7.7  7.5  8.7  7.2         This result is from an external source.   Assessment/Plan: 1. Pain due to onychomycosis of toenails of both feet   2. Controlled type 2 diabetes mellitus without complication, without long-term current use of insulin  [facility])   Patient was evaluated and treated. All patient's and/or POA's questions/concerns addressed on today's visit. Toenails 1-5 debrided in length and girth without incident. Continue foot and shoe inspections daily. Monitor blood glucose per PCP/Endocrinologist's recommendations. Continue soft, supportive shoe gear daily. Report any pedal injuries to medical professional. Call office if there are any questions/concerns.  Return in about 3 months (around 12/06/2024).  [name] [name], DPM       LOCATION: [address], [place] [address]  Office [phone]   [facility] LOCATION: [address] [place], [place] [address] Office [phone]

## 2024-09-12 ENCOUNTER — Encounter: Payer: Self-pay | Admitting: Sleep Medicine

## 2024-09-12 ENCOUNTER — Ambulatory Visit: Admitting: Sleep Medicine

## 2024-09-12 VITALS — BP 120/80 | HR 73 | Temp 97.7°F | Ht 64.0 in | Wt 216.8 lb

## 2024-09-12 DIAGNOSIS — I10 Essential (primary) hypertension: Secondary | ICD-10-CM

## 2024-09-12 DIAGNOSIS — I4891 Unspecified atrial fibrillation: Secondary | ICD-10-CM | POA: Diagnosis not present

## 2024-09-12 DIAGNOSIS — G4733 Obstructive sleep apnea (adult) (pediatric): Secondary | ICD-10-CM

## 2024-09-12 NOTE — Progress Notes (Signed)
 Name:Josanna DUNG PRIEN MRN: [id number] DOB: [birth year]   CHIEF COMPLAINT:  CPAP F/U   HISTORY OF PRESENT ILLNESS:  Mrs. Wheeless is a 89 y.o. w/ a h/o OSA, CHF, DMII, HTN, hyperlipidemia, atrial fibrillation and obesity who presents for CPAP follow up visit. Reports difficulty using CPAP therapy due to mask leaks. She is currently using a full face mask which is uncomfortable. States that she would like to try another mask.     EPWORTH SLEEP SCORE 11    06/12/2024    9:00 AM 10/06/2016   10:00 AM  Results of the Epworth flowsheet  Sitting and reading 3 2  Watching TV 3 1  Sitting, inactive in a public place (e.g. a theatre or a meeting) 1 0  As a passenger in a car for an hour without a break 0 0  Lying down to rest in the afternoon when circumstances permit 2 1  Sitting and talking to someone 0 0  Sitting quietly after a lunch without alcohol 2 3  In a car, while stopped for a few minutes in traffic 0 0  Total score 11 7     PAST MEDICAL HISTORY :   has a past medical history of 1st degree AV block, A-fib (HCC), Anemia, Anxiety, Arthritis, Chest pain, Chronic diastolic heart failure (HCC), CKD (chronic kidney disease), stage IV (HCC), Colon polyps, Congestive heart disease (HCC), Esophageal reflux, Glaucoma, Gout, H/O hiatal hernia, History of UTI, Hyperlipidemia, Moderate mitral regurgitation, Morbid obesity (HCC), OSA (obstructive sleep apnea), OSA on CPAP, Pacemaker, PAF (paroxysmal atrial fibrillation) (HCC), Primary hypertension, Pulmonary hypertension (HCC), Seasonal allergies, SSS (sick sinus syndrome) (HCC), Symptomatic PVCs, Syncope and collapse, Thyroid  disease, Tricuspid regurgitation, Type II diabetes mellitus (HCC), Unspecified glaucoma(365.9), Vertigo, and Wears dentures.  has a past surgical history that includes Incision and drainage of wound (Right, 03/1998); Insert / replace / remove pacemaker (2008; 12/01/2013); Dilation and curettage of uterus; Permanent  pacemaker generator change (N/A, 12/01/2013); Lung biopsy (2010); CARDIOVERSION (N/A, 04/26/2017); Appendectomy; Vaginal hysterectomy (1970); Cardiac catheterization; Cataract extraction w/PHACO (Left, 07/21/2020); Cataract extraction w/PHACO (Right, 08/11/2020); Cardioversion (N/A, 10/17/2023); RIGHT HEART CATH (N/A, 10/23/2023); and Cardioversion (N/A, 12/13/2023). Prior to Admission medications   Medication Sig Start Date End Date Taking? Authorizing Provider  acetaminophen  (TYLENOL ) 500 MG tablet Take 1,000 mg by mouth every 6 (six) hours as needed for moderate pain.    Yes [provider]  allopurinol  (ZYLOPRIM ) 100 MG tablet Take 1 tablet (100 mg total) by mouth daily. 03/07/24  Yes Glendia Shad, MD  apixaban  (ELIQUIS ) 2.5 MG TABS tablet Take 1 tablet (2.5 mg total) by mouth 2 (two) times daily. 05/27/24  Yes Gollan, Timothy J, MD  atorvastatin  (LIPITOR) 20 MG tablet TAKE 1 TABLET (20 MG TOTAL) BY MOUTH DAILY. 12/06/23  Yes Glendia Shad, MD  Blood Glucose Monitoring Suppl (FIFTY50 GLUCOSE METER 2.0) w/Device KIT  07/06/20  Yes [provider]  calcitRIOL  (ROCALTROL ) 0.25 MCG capsule TAKE 1 CAPSULE (0.25 MCG TOTAL) BY MOUTH 1 (ONE) TIME EACH DAY 05/12/21  Yes [provider]  carvedilol  (COREG ) 12.5 MG tablet Take 12.5 mg by mouth 2 (two) times daily with a meal. 04/16/24 04/16/25 Yes [provider]  ferrous sulfate  325 (65 FE) MG EC tablet Take 325 mg by mouth daily.  03/01/17  Yes [provider]  fluticasone -salmeterol (ADVAIR) 100-50 MCG/ACT AEPB Inhale 1 puff into the lungs 2 (two) times daily. 03/13/24  Yes [provider]  glipiZIDE  (GLUCOTROL ) 10  MG tablet Take 5 mg by mouth daily. 03/13/24  Yes [provider]  Glycerin , Laxative, (FLEET LIQUID GLYCERIN  SUPP RE) Place 1 Dose rectally daily as needed (constipation).   Yes [provider]  losartan (COZAAR) 25 MG tablet Take 25 mg by mouth daily. 02/13/24  Yes [provider]  mometasone  (ELOCON ) 0.1 % cream Apply to affected area daily prn. 03/07/24  Yes Glendia Shad, MD  mometasone  (ELOCON ) 0.1 % cream Apply topically. 02/02/17  Yes [provider]  Multiple Vitamins-Minerals (CENTRUM SILVER PO) Take 1 tablet by mouth daily.    Yes [provider]  OneTouch Delica Lancets 33G MISC  07/20/20  Yes [provider]  [facility] VERIO test strip ONCE DAILY USE TO TEST BLOOD SUGAR ONCE DAILY E11.22 12/24/23  Yes Glendia Shad, MD  pantoprazole  (PROTONIX ) 40 MG tablet Take 1 tablet (40 mg total) by mouth daily. 03/07/24  Yes Glendia Shad, MD  polyethylene glycol (MIRALAX  / GLYCOLAX ) packet Take 17 g by mouth daily as needed for moderate constipation.   Yes [provider]  sitaGLIPtin  (JANUVIA ) 25 MG tablet Take 1 tablet (25 mg total) by mouth daily. 10/25/23  Yes Glendia Shad, MD  spironolactone (ALDACTONE) 25 MG tablet Take 25 mg by mouth once. 03/13/24 03/13/25 Yes [provider]  torsemide  (DEMADEX ) 20 MG tablet Take 3 tablets (60 mg total) by mouth daily. 05/13/24  Yes Bensimhon, Daniel R, MD  White Petrolatum-Mineral Oil (ARTIFICIAL TEARS) ointment Place 1 drop into both eyes daily as needed (Dry eyes).   Yes [provider]   Allergies  Allergen Reactions   Metoprolol Other (See Comments)    Bad dreams Patient stated that she had suicidal thoughts while taking this medication.    Diltiazem  Other (See Comments)    Patient stated that she had bad dreams with this medication   Levofloxacin Nausea And Vomiting and Nausea Only         FAMILY HISTORY:  family history includes Allergies in her father; Breast cancer in her maternal aunt; COPD in her brother; Cancer in her mother; Heart disease in her father; Hypertension in her sister; Liver cancer in her mother; Pancreatic cancer in her mother; Rheum arthritis in her father and sister. SOCIAL HISTORY:  reports that she quit smoking about 35 years ago. Her  smoking use included cigarettes. She started smoking about 69 years ago. She has a 17 pack-year smoking history. She has never used smokeless tobacco. She reports that she does not drink alcohol and does not use drugs.   Review of Systems:  Gen:  Denies  fever, sweats, chills weight loss  HEENT: Denies blurred vision, double vision, ear pain, eye pain, hearing loss, nose bleeds, sore throat Cardiac:  No dizziness, chest pain or heaviness, chest tightness,edema, No JVD Resp:   No cough, -sputum production, -shortness of breath,-wheezing, -hemoptysis,  Gi: Denies swallowing difficulty, stomach pain, nausea or vomiting, diarrhea, constipation, bowel incontinence Gu:  Denies bladder incontinence, burning urine Ext:   Denies Joint pain, stiffness or swelling Skin: Denies  skin rash, easy bruising or bleeding or hives Endoc:  Denies polyuria, polydipsia , polyphagia or weight change Psych:   Denies depression, insomnia or hallucinations  Other:  All other systems negative  VITAL SIGNS: BP 120/80   Pulse 73   Temp 97.7 F (36.5 C)   Ht 5' 4 (1.626 m)   Wt 216 lb 12.8 oz (98.3 kg)   SpO2 97%   BMI 37.21 kg/m  Physical Examination:   General Appearance: No distress  EYES PERRLA, EOM intact.   NECK Supple, No JVD Pulmonary: normal breath sounds, No wheezing.  CardiovascularNormal S1,S2.  No m/r/g.   Abdomen: Benign, Soft, non-tender. Skin:   warm, no rashes, no ecchymosis  Extremities: normal, no cyanosis, clubbing. Neuro:without focal findings,  speech normal  PSYCHIATRIC: Mood, affect within normal limits.   ASSESSMENT AND PLAN  OSA For mask leaks, will try patient on the Airtouch N30i nasal mask. Counseled patient on increasing CPAP compliance. Will also reduce CPAP pressure range to 4-14 cm H2O.  Discussed the consequences of untreated sleep apnea. Advised not to drive drowsy for safety of patient and others. Will follow up in 3 months.    HTN Stable, on current  management. Following with PCP.   Atrial fibrillation Stable, on current management.    Patient satisfied with Plan of action and management. All questions answered  I spent a total of 32 minutes reviewing chart data, face-to-face evaluation with the patient, counseling and coordination of care as detailed above.    Jacquiline Zurcher, M.D.  Sleep Medicine [place]

## 2024-09-12 NOTE — Patient Instructions (Signed)

## 2024-09-22 ENCOUNTER — Ambulatory Visit (INDEPENDENT_AMBULATORY_CARE_PROVIDER_SITE_OTHER): Payer: Self-pay

## 2024-09-22 DIAGNOSIS — I495 Sick sinus syndrome: Secondary | ICD-10-CM | POA: Diagnosis not present

## 2024-09-23 LAB — CUP PACEART REMOTE DEVICE CHECK
Battery Impedance: 1766 Ohm
Battery Remaining Longevity: 44 mo
Battery Voltage: 2.75 V
Brady Statistic RV Percent Paced: 100 %
Date Time Interrogation Session: 20251021115721
Implantable Lead Connection Status: 753985
Implantable Lead Connection Status: 753985
Implantable Lead Implant Date: 20080117
Implantable Lead Implant Date: 20080117
Implantable Lead Location: 753859
Implantable Lead Location: 753860
Implantable Lead Model: 5076
Implantable Lead Model: 5076
Implantable Pulse Generator Implant Date: 20141229
Lead Channel Impedance Value: 645 Ohm
Lead Channel Impedance Value: 67 Ohm
Lead Channel Pacing Threshold Amplitude: 0.875 V
Lead Channel Pacing Threshold Pulse Width: 0.4 ms
Lead Channel Setting Pacing Amplitude: 2.5 V
Lead Channel Setting Pacing Pulse Width: 0.4 ms
Lead Channel Setting Sensing Sensitivity: 4 mV
Zone Setting Status: 755011
Zone Setting Status: 755011

## 2024-09-24 ENCOUNTER — Ambulatory Visit: Payer: Self-pay | Admitting: Cardiology

## 2024-09-26 NOTE — Progress Notes (Signed)
 Remote PPM Transmission

## 2024-10-20 ENCOUNTER — Other Ambulatory Visit: Payer: Self-pay | Admitting: Internal Medicine

## 2024-10-20 DIAGNOSIS — E113293 Type 2 diabetes mellitus with mild nonproliferative diabetic retinopathy without macular edema, bilateral: Secondary | ICD-10-CM | POA: Diagnosis not present

## 2024-10-20 DIAGNOSIS — H43823 Vitreomacular adhesion, bilateral: Secondary | ICD-10-CM | POA: Diagnosis not present

## 2024-10-20 DIAGNOSIS — H35033 Hypertensive retinopathy, bilateral: Secondary | ICD-10-CM | POA: Diagnosis not present

## 2024-10-20 DIAGNOSIS — H35361 Drusen (degenerative) of macula, right eye: Secondary | ICD-10-CM | POA: Diagnosis not present

## 2024-10-20 DIAGNOSIS — Z961 Presence of intraocular lens: Secondary | ICD-10-CM | POA: Diagnosis not present

## 2024-10-20 DIAGNOSIS — H353221 Exudative age-related macular degeneration, left eye, with active choroidal neovascularization: Secondary | ICD-10-CM | POA: Diagnosis not present

## 2024-10-20 DIAGNOSIS — H401132 Primary open-angle glaucoma, bilateral, moderate stage: Secondary | ICD-10-CM | POA: Diagnosis not present

## 2024-11-05 ENCOUNTER — Ambulatory Visit: Admitting: Internal Medicine

## 2024-11-20 ENCOUNTER — Encounter: Payer: Self-pay | Admitting: Internal Medicine

## 2024-11-20 ENCOUNTER — Ambulatory Visit: Admitting: Internal Medicine

## 2024-11-20 VITALS — BP 120/76 | HR 71 | Temp 98.0°F | Ht 64.0 in | Wt 215.0 lb

## 2024-11-20 DIAGNOSIS — D631 Anemia in chronic kidney disease: Secondary | ICD-10-CM

## 2024-11-20 DIAGNOSIS — N184 Chronic kidney disease, stage 4 (severe): Secondary | ICD-10-CM | POA: Diagnosis not present

## 2024-11-20 DIAGNOSIS — M25562 Pain in left knee: Secondary | ICD-10-CM

## 2024-11-20 DIAGNOSIS — K219 Gastro-esophageal reflux disease without esophagitis: Secondary | ICD-10-CM

## 2024-11-20 DIAGNOSIS — I480 Paroxysmal atrial fibrillation: Secondary | ICD-10-CM

## 2024-11-20 DIAGNOSIS — I10 Essential (primary) hypertension: Secondary | ICD-10-CM

## 2024-11-20 DIAGNOSIS — I5032 Chronic diastolic (congestive) heart failure: Secondary | ICD-10-CM | POA: Diagnosis not present

## 2024-11-20 DIAGNOSIS — G4733 Obstructive sleep apnea (adult) (pediatric): Secondary | ICD-10-CM

## 2024-11-20 DIAGNOSIS — E1122 Type 2 diabetes mellitus with diabetic chronic kidney disease: Secondary | ICD-10-CM

## 2024-11-20 DIAGNOSIS — R040 Epistaxis: Secondary | ICD-10-CM

## 2024-11-20 DIAGNOSIS — R0602 Shortness of breath: Secondary | ICD-10-CM | POA: Diagnosis not present

## 2024-11-20 DIAGNOSIS — N2581 Secondary hyperparathyroidism of renal origin: Secondary | ICD-10-CM

## 2024-11-20 DIAGNOSIS — E782 Mixed hyperlipidemia: Secondary | ICD-10-CM | POA: Diagnosis not present

## 2024-11-20 DIAGNOSIS — E7800 Pure hypercholesterolemia, unspecified: Secondary | ICD-10-CM | POA: Diagnosis not present

## 2024-11-20 DIAGNOSIS — N185 Chronic kidney disease, stage 5: Secondary | ICD-10-CM | POA: Diagnosis not present

## 2024-11-20 DIAGNOSIS — K625 Hemorrhage of anus and rectum: Secondary | ICD-10-CM

## 2024-11-20 DIAGNOSIS — I272 Pulmonary hypertension, unspecified: Secondary | ICD-10-CM

## 2024-11-20 LAB — HM DIABETES FOOT EXAM

## 2024-11-20 MED ORDER — ATORVASTATIN CALCIUM 20 MG PO TABS: 20.0000 mg | ORAL_TABLET | Freq: Every day | ORAL | 3 refills | Status: AC

## 2024-11-20 MED ORDER — ALLOPURINOL 100 MG PO TABS: 100.0000 mg | ORAL_TABLET | Freq: Every day | ORAL | 3 refills | Status: AC

## 2024-11-20 MED ORDER — PANTOPRAZOLE SODIUM 40 MG PO TBEC: 40.0000 mg | DELAYED_RELEASE_TABLET | Freq: Every day | ORAL | 3 refills | Status: AC

## 2024-11-20 MED ORDER — SITAGLIPTIN PHOSPHATE 25 MG PO TABS: 25.0000 mg | ORAL_TABLET | Freq: Every day | ORAL | 3 refills | Status: AC

## 2024-11-20 NOTE — Progress Notes (Signed)
 "  Subjective:    Patient ID: [name] [name], female    DOB: Mar 18, 1935, 89 y.o.   MRN: [id number]  Patient here for  Chief Complaint  Patient presents with   Medical Management of Chronic Issues    HPI Here for a scheduled follow up. Saw cardiology 08/13/24 - f/u chronic HFpEF with TTE - LVEF 60% on 10/20/23. Taking torsemide  - she had decreased herself to 60mg  q day several days before that appt. Recommended to continue 60mg  torsemide  with plans to uptitrate to 80mg  for edema/weight gain. S/p DCCV 12/2023. Continue cpap. Reviewed recent labs. A1c 7.7. saw Dr Jaclynn 07/30/24 - f/u CKD V. Recommended to hold losartan and spironolactone. Not interested in dialysis. GFR 13 11/12/24. Had f/u 11/19/24 - stable. Not interested in dialysis. Saw hematology 06/27/24 - f/u anemia felt to be secondary to anemia of chronic disease. Last epo injection 06/2022. Stable in 9-11 range. Platelet count - stable. Was referred to ENT for recurring nose bleeds. Had f/u with pulmonary 09/12/24 - continues CPAP - adjusted. New mask. Pacemaker interrogation 09/24/24 - battery and lead parameters - stable with stable capture and sensing. Breathing stable. Overall breathing improved. Reports occasionally will notice - abdominal pressure. Reports noticing occasional blood in her stool. Will notice some burning with bowel movement. Has been wiping a lot. Some loose stool. Has been present 6-7 months - intermittent. Eating. No vomiting. Overall she feels good.    Past Medical History:  Diagnosis Date   1st degree AV block    a. PR 400 msec with Apacing   A-fib (HCC)    Hx of   Anemia    Anxiety    Arthritis    knees and lower back    Chest pain    a. 2004 Cath:  reportedly nl;  b. 09/2013 Neg MV; c. 11/2017 nl ETT; d. 08/2023 MV: No isch/infarct. nl LV fxn. Mild Ao Ca2+.   Chronic diastolic heart failure (HCC)    a. 09/2013 Echo: EF 50-55%, mild LVH, mild to mod MR/TR; b. 04/2023 Echo: EF 55-60%, no rwma, nl RV size/fxn, RVSP  44.5mmHg. Mod dil LA. Mod MR/TR.   CKD (chronic kidney disease), stage IV (HCC)    Colon polyps    Congestive heart disease (HCC)    Esophageal reflux    Glaucoma    Gout    H/O hiatal hernia    History of UTI    Hyperlipidemia    Moderate mitral regurgitation    a. 04/2023 Echo: Mod MR.   Morbid obesity (HCC)    OSA (obstructive sleep apnea)    OSA on CPAP    CPAP   Pacemaker    copy of medtronic card in chart   PAF (paroxysmal atrial fibrillation) (HCC)    a. 10/2023 s/p DCCV (150J); b. CHA2DS2VASc = 5-->warfarin.   Primary hypertension    controlled on meds   Pulmonary hypertension (HCC)    a. 04/2023 Echo: RVSP 44.83mmHg.   Seasonal allergies    SSS (sick sinus syndrome) (HCC)    a. 2008 s/p MDT PPM;  b. 11/2013 Gen change- MDT ADDRL1 Adapta DC PPM, ser # [id number].   Symptomatic PVCs    Syncope and collapse    a. Felt to be vasovagal.   Thyroid  disease    nodules on thyroid , with goiter, on no meds per pt   Tricuspid regurgitation    Type II diabetes mellitus (HCC)    Type 2   Unspecified glaucoma(365.9)  Vertigo    in past   Wears dentures    upper full and lower partial   Past Surgical History:  Procedure Laterality Date   APPENDECTOMY     CARDIAC CATHETERIZATION     CARDIOVERSION N/A 04/26/2017   Procedure: Cardioversion;  Surgeon: Perla Evalene PARAS, MD;  Location: [facility];  Service: Cardiovascular;  Laterality: N/A;   CARDIOVERSION N/A 10/17/2023   Procedure: CARDIOVERSION;  Surgeon: Perla Evalene PARAS, MD;  Location: [facility];  Service: Cardiovascular;  Laterality: N/A;   CARDIOVERSION N/A 12/13/2023   Procedure: CARDIOVERSION;  Surgeon: Perla Evalene PARAS, MD;  Location: [facility];  Service: Cardiovascular;  Laterality: N/A;   CATARACT EXTRACTION W/PHACO Left 07/21/2020   Procedure: CATARACT EXTRACTION PHACO AND INTRAOCULAR LENS PLACEMENT (IOC) LEFT DIABETIC;  Surgeon: Mittie Gaskin, MD;  Location: [facility];  Service: Ophthalmology;   Laterality: Left;  10.25 1:13.2 14.0%   CATARACT EXTRACTION W/PHACO Right 08/11/2020   Procedure: CATARACT EXTRACTION PHACO AND INTRAOCULAR LENS PLACEMENT (IOC) RIGHT DIABETIC;  Surgeon: Mittie Gaskin, MD;  Location: [facility];  Service: Ophthalmology;  Laterality: Right;  11.63 1:25.0 13.7%   DILATION AND CURETTAGE OF UTERUS     INCISION AND DRAINAGE OF WOUND Right 03/1998   leg; it was like a boil (12/01/2013)   INSERT / REPLACE / REMOVE PACEMAKER  2008; 12/01/2013   MDT ADDRL1 pacemaker - gen change by Dr Fernande 12/01/2013   LUNG BIOPSY  2010   unc   PERMANENT PACEMAKER GENERATOR CHANGE N/A 12/01/2013   Procedure: PERMANENT PACEMAKER GENERATOR CHANGE;  Surgeon: Elspeth JAYSON Fernande, MD;  Location: [facility] C[facility] LAB;  Service: Cardiovascular;  Laterality: N/A;   RIGHT HEART CATH N/A 10/23/2023   Procedure: RIGHT HEART CATH;  Surgeon: Gardenia Led, DO;  Location: [facility] INVASIVE CV LAB;  Service: Cardiovascular;  Laterality: N/A;   VAGINAL HYSTERECTOMY  1970   Family History  Problem Relation Age of Onset   Cancer Mother    Liver cancer Mother    Pancreatic cancer Mother    Heart disease Father    Rheum arthritis Father    Allergies Father    Hypertension Sister    Rheum arthritis Sister    COPD Brother    Breast cancer Maternal Aunt    Social History   Socioeconomic History   Marital status: Widowed    Spouse name: Not on file   Number of children: 2   Years of education: Not on file   Highest education level: 12th grade  Occupational History   Occupation: retired  Tobacco Use   Smoking status: Former    Current packs/day: 0.00    Average packs/day: 0.5 packs/day for 34.0 years (17.0 ttl pk-yrs)    Types: Cigarettes    Start date: 08/16/1955    Quit date: 08/15/1989    Years since quitting: 35.3   Smokeless tobacco: Never  Vaping Use   Vaping status: Never Used  Substance and Sexual Activity   Alcohol use: No    Alcohol/week: 0.0 standard drinks of  alcohol   Drug use: No   Sexual activity: Never  Other Topics Concern   Not on file  Social History Narrative   Retired. Widowed. Regularly exercises.    Social Drivers of Health   Tobacco Use: Medium Risk (11/29/2024)   Patient History    Smoking Tobacco Use: Former    Smokeless Tobacco Use: Never    Passive Exposure: Not on file  Financial Resource Strain: Low Risk (01/07/2024)  Overall Financial Resource Strain (CARDIA)    Difficulty of Paying Living Expenses: Not hard at all  Food Insecurity: No Food Insecurity (01/07/2024)   Hunger Vital Sign    Worried About Running Out of Food in the Last Year: Never true    Ran Out of Food in the Last Year: Never true  Transportation Needs: No Transportation Needs (01/07/2024)   PRAPARE - Administrator, Civil Service (Medical): No    Lack of Transportation (Non-Medical): No  Physical Activity: Sufficiently Active (01/07/2024)   Exercise Vital Sign    Days of Exercise per Week: 3 days    Minutes of Exercise per Session: 60 min  Stress: No Stress Concern Present (01/07/2024)   [employer] of Occupational Health - Occupational Stress Questionnaire    Feeling of Stress : Not at all  Social Connections: Moderately Integrated (01/07/2024)   Social Connection and Isolation Panel    Frequency of Communication with Friends and Family: More than three times a week    Frequency of Social Gatherings with Friends and Family: More than three times a week    Attends Religious Services: More than 4 times per year    Active Member of [employer] or Organizations: Yes    Attends Banker Meetings: More than 4 times per year    Marital Status: Widowed  Depression (PHQ2-9): Medium Risk (05/27/2024)   Depression (PHQ2-9)    PHQ-2 Score: 9  Alcohol Screen: Low Risk (01/07/2024)   Alcohol Screen    Last Alcohol Screening Score (AUDIT): 0  Housing: Unknown (01/07/2024)   Housing Stability Vital Sign    Unable to Pay for Housing in the Last  Year: No    Number of Times Moved in the Last Year: Not on file    Homeless in the Last Year: No  Utilities: Not At Risk (01/07/2024)   AHC Utilities    Threatened with loss of utilities: No  Health Literacy: Adequate Health Literacy (01/07/2024)   B1300 Health Literacy    Frequency of need for help with medical instructions: Never     Review of Systems  Constitutional:  Negative for appetite change and unexpected weight change.  HENT:  Negative for congestion and sinus pressure.   Respiratory:  Negative for cough and chest tightness.        Breathing overall improved.   Cardiovascular:  Negative for chest pain and palpitations.  Gastrointestinal:  Negative for nausea and vomiting.       Abdominal pressure as outlined. Some burning with bm. Loose stool - intermittent. Blood in stool - intermittent.   Genitourinary:  Negative for dysuria.  Musculoskeletal:  Negative for joint swelling and myalgias.  Skin:  Negative for color change and rash.  Neurological:  Negative for dizziness and headaches.  Psychiatric/Behavioral:  Negative for agitation and dysphoric mood.        Objective:     BP 120/76   Pulse 71   Temp 98 F (36.7 C) (Oral)   Ht 5' 4 (1.626 m)   Wt 215 lb (97.5 kg)   SpO2 99%   BMI 36.90 kg/m  Wt Readings from Last 3 Encounters:  11/20/24 215 lb (97.5 kg)  09/12/24 216 lb 12.8 oz (98.3 kg)  09/03/24 217 lb 9.6 oz (98.7 kg)    Physical Exam Vitals reviewed.  Constitutional:      General: She is not in acute distress.    Appearance: Normal appearance.  HENT:     Head: Normocephalic  and atraumatic.     Right Ear: External ear normal.     Left Ear: External ear normal.     Mouth/Throat:     Pharynx: No oropharyngeal exudate or posterior oropharyngeal erythema.  Eyes:     General: No scleral icterus.       Right eye: No discharge.        Left eye: No discharge.     Conjunctiva/sclera: Conjunctivae normal.  Neck:     Thyroid : No thyromegaly.   Cardiovascular:     Rate and Rhythm: Normal rate and regular rhythm.  Pulmonary:     Effort: No respiratory distress.     Breath sounds: Normal breath sounds. No wheezing.  Abdominal:     General: Bowel sounds are normal.     Palpations: Abdomen is soft.     Tenderness: There is no abdominal tenderness.  Musculoskeletal:        General: No tenderness.     Cervical back: Neck supple. No tenderness.     Comments: No increaed swelling.   Lymphadenopathy:     Cervical: No cervical adenopathy.  Skin:    Findings: No erythema or rash.  Neurological:     Mental Status: She is alert.  Psychiatric:        Mood and Affect: Mood normal.        Behavior: Behavior normal.         Outpatient Encounter Medications as of 11/20/2024  Medication Sig   acetaminophen  (TYLENOL ) 500 MG tablet Take 1,000 mg by mouth every 6 (six) hours as needed for moderate pain.    allopurinol  (ZYLOPRIM ) 100 MG tablet Take 1 tablet (100 mg total) by mouth daily.   atorvastatin  (LIPITOR) 20 MG tablet Take 1 tablet (20 mg total) by mouth daily.   Blood Glucose Monitoring Suppl (FIFTY50 GLUCOSE METER 2.0) w/Device KIT    calcitRIOL  (ROCALTROL ) 0.25 MCG capsule Take 0.25 mcg by mouth. Take 1 capsule (0.25 mcg total) by mouth 3 times weekly: Mon Wed and Friday morning   carvedilol  (COREG ) 12.5 MG tablet Take 12.5 mg by mouth 2 (two) times daily with a meal.   ferrous sulfate  325 (65 FE) MG EC tablet Take 325 mg by mouth daily.    glipiZIDE  (GLUCOTROL ) 10 MG tablet Take 5 mg by mouth daily.   Glycerin , Laxative, (FLEET LIQUID GLYCERIN  SUPP RE) Place 1 Dose rectally daily as needed (constipation).   mometasone  (ELOCON ) 0.1 % cream Apply to affected area daily prn.   mometasone  (ELOCON ) 0.1 % cream Apply topically.   Multiple Vitamins-Minerals (CENTRUM SILVER PO) Take 1 tablet by mouth daily.    OneTouch Delica Lancets 33G MISC    ONETOUCH VERIO test strip ONCE DAILY USE TO TEST BLOOD SUGAR ONCE DAILY E11.22    pantoprazole  (PROTONIX ) 40 MG tablet Take 1 tablet (40 mg total) by mouth daily.   polyethylene glycol (MIRALAX  / GLYCOLAX ) packet Take 17 g by mouth daily as needed for moderate constipation.   sitaGLIPtin  (JANUVIA ) 25 MG tablet Take 1 tablet (25 mg total) by mouth daily.   torsemide  (DEMADEX ) 20 MG tablet Take 3 tablets (60 mg total) by mouth daily.   White Petrolatum-Mineral Oil (ARTIFICIAL TEARS) ointment Place 1 drop into both eyes daily as needed (Dry eyes).   [DISCONTINUED] allopurinol  (ZYLOPRIM ) 100 MG tablet Take 1 tablet (100 mg total) by mouth daily.   [DISCONTINUED] apixaban  (ELIQUIS ) 2.5 MG TABS tablet Take 1 tablet (2.5 mg total) by mouth 2 (two) times daily.   [  DISCONTINUED] atorvastatin  (LIPITOR) 20 MG tablet TAKE ONE TABLET BY MOUTH ONE TIME DAILY   [DISCONTINUED] pantoprazole  (PROTONIX ) 40 MG tablet Take 1 tablet (40 mg total) by mouth daily.   [DISCONTINUED] sitaGLIPtin  (JANUVIA ) 25 MG tablet Take 1 tablet (25 mg total) by mouth daily.   No facility-administered encounter medications on file as of 11/20/2024.     Lab Results  Component Value Date   WBC 6.1 06/27/2024   HGB 9.5 (L) 06/27/2024   HCT 27.7 (L) 06/27/2024   PLT 117 (L) 06/27/2024   GLUCOSE 169 (H) 08/29/2024   CHOL 131 08/29/2024   TRIG 79.0 08/29/2024   HDL 45.00 08/29/2024   LDLCALC 70 08/29/2024   ALT 26 08/29/2024   AST 25 08/29/2024   NA 145 08/29/2024   K 3.6 08/29/2024   CL 103 08/29/2024   CREATININE 3.58 (H) 08/29/2024   BUN 51 (H) 08/29/2024   CO2 32 08/29/2024   TSH 1.01 08/29/2024   INR 1.8 (H) 11/09/2023   HGBA1C 7.7 (H) 08/29/2024       Assessment & Plan:  SOB (shortness of breath) Assessment & Plan: Saw cardiology 08/13/24 - f/u chronic HFpEF with TTE - LVEF 60% on 10/20/23. Taking torsemide  - she had decreased herself to 60mg  q day several days before that appt. Recommended to continue 60mg  torsemide  with plans to uptitrate to 80mg  for edema/weight gain. S/p DCCV 12/2023. Continue  cpap. Overall breathing improved.    Gastro-esophageal reflux disease without esophagitis -     Pantoprazole  Sodium; Take 1 tablet (40 mg total) by mouth daily.  Dispense: 90 tablet; Refill: 3  Chronic diastolic heart failure (HCC) Assessment & Plan: Breathing improved overall. On torsemide  60mg  q day. Continues coreg . No evidence of volume overload today on exam. Follow.   Orders: -     Basic metabolic panel with GFR; Future  Type 2 diabetes mellitus with stage 5 chronic kidney disease not on chronic dialysis, without long-term current use of insulin  [facility]) Assessment & Plan: Has been seeing Dr Cherilyn for her diabetes and f/u multinodular goiter. She was previously having some intermittent low sugars.  She restarted glipizide .  We discussed remaining off this medication, given the low sugars and her renal function. She did have pancreatitis in the past, so we had discussed avoiding GLP - 1 agonist. On discussion today, states had recurring problems with pancreatitis in the 80s. Given her renal function, she is unable to use SGLT-2 and metformin . Continue low carb diet and exercise. Taking januvia . Follow met b and A1c.   Orders: -     Hemoglobin A1c; Future  Pure hypercholesterolemia -     Hepatic function panel; Future -     Lipid panel; Future  Epistaxis  Anemia due to chronic renal failure treated with erythropoietin, stage 4 (severe) [facility]) Assessment & Plan: Saw hematology 06/27/24 - f/u anemia felt to be secondary to anemia of chronic disease. Last epo injection 06/2022. Stable in 9-11 range. Platelet count - stable. Follow cbc.    Secondary hyperparathyroidism of renal origin Assessment & Plan: Followed by Nephrology.    Pulmonary hypertension, unspecified (HCC) Assessment & Plan: Continue f/u with cardiology/pulmonary. Continue cpap.    PAF (paroxysmal atrial fibrillation) (HCC) Assessment & Plan: Continue eliquis . Continue coreg . Overall stable. Continue f/u with  cardiology.    OSA (obstructive sleep apnea) Assessment & Plan: Continue cpap.    Mixed hyperlipidemia Assessment & Plan: Continue lipitor.  Low cholesterol diet and exercise.  Follow lipid panel.  Essential hypertension Assessment & Plan: Continue coreg . Taking torsemide  60mg  daily. Off losartan and aldactone. Blood pressure as outlined. No change today. Nephrology following metabolic panel.    CKD (chronic kidney disease) stage 5, GFR less than 15 ml/min (HCC) Assessment & Plan: Followed by nephrology. Discussed with her today. Declines dialysis.    Rectal bleeding Assessment & Plan: Reported intermittent blood as outlined. Pain with some bowel movements - burning. Increased wiping. Discussed avoiding antibacterial wipes. Use water to clean. Benefiber to keep bowels moving and formed. Avoid straining. Discussed GI evaluation. Wants to hold on GI evaluation. Wants to hold on further testing, CT, etc. Follow. Call with update.    Left knee pain, unspecified chronicity Assessment & Plan: Intermittent flare. Desires no further w/up or intervention. Follow.    Other orders -     Allopurinol ; Take 1 tablet (100 mg total) by mouth daily.  Dispense: 90 tablet; Refill: 3 -     Atorvastatin  Calcium ; Take 1 tablet (20 mg total) by mouth daily.  Dispense: 90 tablet; Refill: 3 -     SITagliptin  Phosphate; Take 1 tablet (25 mg total) by mouth daily.  Dispense: 90 tablet; Refill: 3     Allena Hamilton, MD "

## 2024-11-21 ENCOUNTER — Encounter: Payer: Self-pay | Admitting: Cardiovascular Disease

## 2024-11-21 ENCOUNTER — Other Ambulatory Visit: Payer: Self-pay | Admitting: *Deleted

## 2024-11-21 DIAGNOSIS — I480 Paroxysmal atrial fibrillation: Secondary | ICD-10-CM

## 2024-11-21 MED ORDER — APIXABAN 2.5 MG PO TABS: 2.5000 mg | ORAL_TABLET | Freq: Two times a day (BID) | ORAL | 3 refills | Status: AC

## 2024-11-29 ENCOUNTER — Encounter: Payer: Self-pay | Admitting: Internal Medicine

## 2024-11-29 DIAGNOSIS — M25569 Pain in unspecified knee: Secondary | ICD-10-CM | POA: Insufficient documentation

## 2024-11-29 DIAGNOSIS — K625 Hemorrhage of anus and rectum: Secondary | ICD-10-CM | POA: Insufficient documentation

## 2024-11-29 NOTE — Assessment & Plan Note (Signed)
 Saw hematology 06/27/24 - f/u anemia felt to be secondary to anemia of chronic disease. Last epo injection 06/2022. Stable in 9-11 range. Platelet count - stable. Follow cbc.

## 2024-11-29 NOTE — Assessment & Plan Note (Signed)
 Continue f/u with cardiology/pulmonary. Continue cpap.

## 2024-11-29 NOTE — Assessment & Plan Note (Signed)
 Breathing improved overall. On torsemide  60mg  q day. Continues coreg . No evidence of volume overload today on exam. Follow.

## 2024-11-29 NOTE — Assessment & Plan Note (Signed)
 Continue eliquis . Continue coreg . Overall stable. Continue f/u with cardiology.

## 2024-11-29 NOTE — Assessment & Plan Note (Signed)
 Intermittent flare. Desires no further w/up or intervention. Follow.

## 2024-11-29 NOTE — Assessment & Plan Note (Signed)
"   Continue cpap         "

## 2024-11-29 NOTE — Assessment & Plan Note (Signed)
"   Followed by Nephrology.          "

## 2024-11-29 NOTE — Assessment & Plan Note (Signed)
 Continue coreg . Taking torsemide  60mg  daily. Off losartan and aldactone. Blood pressure as outlined. No change today. Nephrology following metabolic panel.

## 2024-11-29 NOTE — Assessment & Plan Note (Signed)
 Saw cardiology 08/13/24 - f/u chronic HFpEF with TTE - LVEF 60% on 10/20/23. Taking torsemide  - she had decreased herself to 60mg  q day several days before that appt. Recommended to continue 60mg  torsemide  with plans to uptitrate to 80mg  for edema/weight gain. S/p DCCV 12/2023. Continue cpap. Overall breathing improved.

## 2024-11-29 NOTE — Assessment & Plan Note (Signed)
 Reported intermittent blood as outlined. Pain with some bowel movements - burning. Increased wiping. Discussed avoiding antibacterial wipes. Use water to clean. Benefiber to keep bowels moving and formed. Avoid straining. Discussed GI evaluation. Wants to hold on GI evaluation. Wants to hold on further testing, CT, etc. Follow. Call with update.

## 2024-11-29 NOTE — Assessment & Plan Note (Signed)
Continue lipitor.  Low cholesterol diet and exercise.  Follow lipid panel.

## 2024-11-29 NOTE — Assessment & Plan Note (Signed)
 Has been seeing Dr Cherilyn for her diabetes and f/u multinodular goiter. She was previously having some intermittent low sugars.  She restarted glipizide .  We discussed remaining off this medication, given the low sugars and her renal function. She did have pancreatitis in the past, so we had discussed avoiding GLP - 1 agonist. On discussion today, states had recurring problems with pancreatitis in the 80s. Given her renal function, she is unable to use SGLT-2 and metformin . Continue low carb diet and exercise. Taking januvia . Follow met b and A1c.

## 2024-11-29 NOTE — Assessment & Plan Note (Signed)
 Followed by nephrology. Discussed with her today. Declines dialysis.

## 2024-12-04 NOTE — Progress Notes (Unsigned)
 Cardiology Office Note  Date:  12/05/2024   ID:  [name] [name], DOB 29-May-1935, MRN [id number]  PCP:  Glendia Shad, MD   Chief Complaint  Patient presents with   Follow-up    4 month f/u c/o sob with exertion. Meds reviewed verbally with pt.    HPI:  [name] [name] is a 89 yo woman with a history of  Ejection fraction 50-55%  Paroxysmal atrial fibrillation, on warfarin Morbid obesity,  symptomatic palpitations/PVCs,  pacemaker implantation for sick sinus syndrome/tachybradycardia syndrome with chronic symptoms of palpitations,  shortness of breath with exertion,  chronic fatigue Obstructive sleep apnea on CPAP Chronic renal failure Cardioversion December 13, 2023 who presents for f/u of her atrial fibrillation and chronic shortness of breath  Last seen by myself in clinic 11/24 Seen by EP  Pacer functioning well  followed by heart failure last evaluated September 2025 Scheduled to be seen early February 2025  Hospital admission November 2024 CHF, A-fib with RVR, severely elevated right heart pressures with EF 55% Cardioversion December 13, 2023  Tolerating torsemide  60 daily, rarely takes 80 Elevated creatinine greater than 4 in the past now down to 3.3 Spironolactone previously held On Jardiance 25 daily  Does exercise 3 days a week at the [facility] No leg swelling  Lab work reviewed Creatinine 3.35 BUN 57 Hemoglobin 10.6 A1c 8.7  History of sleep apnea on CPAP  has restless leg  EKG personally reviewed by myself on todays visit EKG Interpretation Date/Time:  Friday December 05 2024 10:58:39 EST Ventricular Rate:  87 PR Interval:    QRS Duration:  176 QT Interval:  458 QTC Calculation: 551 R Axis:   -66  Text Interpretation: Ventricular-paced rhythm When compared with ECG of 12-Mar-2024 13:43, Electronic ventricular pacemaker has replaced Wide QRS rhythm Confirmed by Perla Lye [phone]) on 12/05/2024 11:07:06 AM    Other past medical history reviewed Covid in  sept 2023  Echocardiogram May 2022 NORMAL LEFT VENTRICULAR SYSTOLIC FUNCTION  NORMAL RIGHT VENTRICULAR SYSTOLIC FUNCTION  NO VALVULAR STENOSIS  MILD to MODERATE MR  MODERATE TR  MILD PR  EF 55-60%  Closest EF: >55% (Estimated)  Size: MILDLY ENLARGED  Mitral: MILD MR  Tricuspid: MODERATE TR  RV Masses: CATHETER IN RV   Stress test October 2020, Myoview  No significant ischemia, normal ejection fraction  History of sleep disorder, on CPAP   PMH:   has a past medical history of 1st degree AV block, A-fib (HCC), Anemia, Anxiety, Arthritis, Chest pain, Chronic diastolic heart failure (HCC), CKD (chronic kidney disease), stage IV (HCC), Colon polyps, Congestive heart disease (HCC), Esophageal reflux, Glaucoma, Gout, H/O hiatal hernia, History of UTI, Hyperlipidemia, Moderate mitral regurgitation, Morbid obesity (HCC), OSA (obstructive sleep apnea), OSA on CPAP, Pacemaker, PAF (paroxysmal atrial fibrillation) (HCC), Primary hypertension, Pulmonary hypertension (HCC), Seasonal allergies, SSS (sick sinus syndrome) (HCC), Symptomatic PVCs, Syncope and collapse, Thyroid  disease, Tricuspid regurgitation, Type II diabetes mellitus (HCC), Unspecified glaucoma(365.9), Vertigo, and Wears dentures.  PSH:    Past Surgical History:  Procedure Laterality Date   APPENDECTOMY     CARDIAC CATHETERIZATION     CARDIOVERSION N/A 04/26/2017   Procedure: Cardioversion;  Surgeon: Perla Lye PARAS, MD;  Location: [facility];  Service: Cardiovascular;  Laterality: N/A;   CARDIOVERSION N/A 10/17/2023   Procedure: CARDIOVERSION;  Surgeon: Perla Lye PARAS, MD;  Location: [facility];  Service: Cardiovascular;  Laterality: N/A;   CARDIOVERSION N/A 12/13/2023   Procedure: CARDIOVERSION;  Surgeon: Perla Lye PARAS, MD;  Location: [facility];  Service: Cardiovascular;  Laterality: N/A;   CATARACT EXTRACTION W/PHACO Left 07/21/2020   Procedure: CATARACT EXTRACTION PHACO AND INTRAOCULAR LENS PLACEMENT (IOC) LEFT DIABETIC;   Surgeon: Mittie Gaskin, MD;  Location: [facility];  Service: Ophthalmology;  Laterality: Left;  10.25 1:13.2 14.0%   CATARACT EXTRACTION W/PHACO Right 08/11/2020   Procedure: CATARACT EXTRACTION PHACO AND INTRAOCULAR LENS PLACEMENT (IOC) RIGHT DIABETIC;  Surgeon: Mittie Gaskin, MD;  Location: [facility];  Service: Ophthalmology;  Laterality: Right;  11.63 1:25.0 13.7%   DILATION AND CURETTAGE OF UTERUS     INCISION AND DRAINAGE OF WOUND Right 03/1998   leg; it was like a boil (12/01/2013)   INSERT / REPLACE / REMOVE PACEMAKER  2008; 12/01/2013   MDT ADDRL1 pacemaker - gen change by Dr Fernande 12/01/2013   LUNG BIOPSY  2010   unc   PERMANENT PACEMAKER GENERATOR CHANGE N/A 12/01/2013   Procedure: PERMANENT PACEMAKER GENERATOR CHANGE;  Surgeon: Elspeth JAYSON Fernande, MD;  Location: [facility] CATH LAB;  Service: Cardiovascular;  Laterality: N/A;   RIGHT HEART CATH N/A 10/23/2023   Procedure: RIGHT HEART CATH;  Surgeon: Gardenia Led, DO;  Location: [facility] INVASIVE CV LAB;  Service: Cardiovascular;  Laterality: N/A;   VAGINAL HYSTERECTOMY  1970    Current Outpatient Medications  Medication Sig Dispense Refill   acetaminophen  (TYLENOL ) 500 MG tablet Take 1,000 mg by mouth every 6 (six) hours as needed for moderate pain.      allopurinol  (ZYLOPRIM ) 100 MG tablet Take 1 tablet (100 mg total) by mouth daily. 90 tablet 3   apixaban  (ELIQUIS ) 2.5 MG TABS tablet Take 1 tablet (2.5 mg total) by mouth 2 (two) times daily. 90 tablet 3   atorvastatin  (LIPITOR) 20 MG tablet Take 1 tablet (20 mg total) by mouth daily. 90 tablet 3   Blood Glucose Monitoring Suppl (FIFTY50 GLUCOSE METER 2.0) w/Device KIT      calcitRIOL  (ROCALTROL ) 0.25 MCG capsule Take 0.25 mcg by mouth. Take 1 capsule (0.25 mcg total) by mouth 3 times weekly: Mon Wed and Friday morning     carvedilol  (COREG ) 12.5 MG tablet Take 12.5 mg by mouth 2 (two) times daily with a meal.     ferrous sulfate  325 (65 FE) MG EC tablet  Take 325 mg by mouth daily.      glipiZIDE  (GLUCOTROL ) 10 MG tablet Take 5 mg by mouth daily.     Glycerin , Laxative, (FLEET LIQUID GLYCERIN  SUPP RE) Place 1 Dose rectally daily as needed (constipation).     mometasone  (ELOCON ) 0.1 % cream Apply to affected area daily prn. 30 g 0   mometasone  (ELOCON ) 0.1 % cream Apply topically.     Multiple Vitamins-Minerals (CENTRUM SILVER PO) Take 1 tablet by mouth daily.      OneTouch Delica Lancets 33G MISC      ONETOUCH VERIO test strip ONCE DAILY USE TO TEST BLOOD SUGAR ONCE DAILY E11.22 100 strip 4   pantoprazole  (PROTONIX ) 40 MG tablet Take 1 tablet (40 mg total) by mouth daily. 90 tablet 3   polyethylene glycol (MIRALAX  / GLYCOLAX ) packet Take 17 g by mouth daily as needed for moderate constipation.     sitaGLIPtin  (JANUVIA ) 25 MG tablet Take 1 tablet (25 mg total) by mouth daily. 90 tablet 3   torsemide  (DEMADEX ) 20 MG tablet Take 3 tablets (60 mg total) by mouth daily.     White Petrolatum-Mineral Oil (ARTIFICIAL TEARS) ointment Place 1 drop into both eyes daily as needed (Dry eyes).  No current facility-administered medications for this visit.    Allergies:   Metoprolol, Diltiazem , and Levofloxacin   Social History:  The patient  reports that she quit smoking about 35 years ago. Her smoking use included cigarettes. She started smoking about 69 years ago. She has a 17 pack-year smoking history. She has never used smokeless tobacco. She reports that she does not drink alcohol and does not use drugs.   Family History:   family history includes Allergies in her father; Breast cancer in her maternal aunt; COPD in her brother; Cancer in her mother; Heart disease in her father; Hypertension in her sister; Liver cancer in her mother; Pancreatic cancer in her mother; Rheum arthritis in her father and sister.   Review of Systems: Review of Systems  Constitutional: Negative.   HENT: Negative.    Respiratory: Negative.    Cardiovascular: Negative.    Gastrointestinal: Negative.   Musculoskeletal: Negative.   Neurological: Negative.   Psychiatric/Behavioral: Negative.    All other systems reviewed and are negative.  PHYSICAL EXAM: VS:  BP 120/70 (BP Location: Left Arm, Patient Position: Sitting, Cuff Size: Large)   Pulse 87   Ht 5' 4 (1.626 m)   Wt 217 lb 6 oz (98.6 kg)   SpO2 95%   BMI 37.31 kg/m  , BMI Body mass index is 37.31 kg/m. Constitutional:  oriented to person, place, and time. No distress.  HENT:  Head: Grossly normal Eyes:  no discharge. No scleral icterus.  Neck: No JVD, no carotid bruits  Cardiovascular: Regular rate and rhythm, no murmurs appreciated Pulmonary/Chest: Clear to auscultation bilaterally, no wheezes or rails Abdominal: Soft.  no distension.  no tenderness.  Musculoskeletal: Normal range of motion Neurological:  normal muscle tone. Coordination normal. No atrophy Skin: Skin warm and dry Psychiatric: normal affect, pleasant  Recent Labs: 06/27/2024: Hemoglobin 9.5; Platelet Count 117 08/13/2024: B Natriuretic Peptide 724.4 08/29/2024: ALT 26; BUN 51; Creatinine, Ser 3.58; Potassium 3.6; Sodium 145; TSH 1.01    Lipid Panel Lab Results  Component Value Date   CHOL 131 08/29/2024   HDL 45.00 08/29/2024   LDLCALC 70 08/29/2024   TRIG 79.0 08/29/2024      Wt Readings from Last 3 Encounters:  12/05/24 217 lb 6 oz (98.6 kg)  11/20/24 215 lb (97.5 kg)  09/12/24 216 lb 12.8 oz (98.3 kg)     ASSESSMENT AND PLAN:  Paroxysmal atrial fibrillation (HCC) -  On carvedilol , maintaining normal sinus rhythm Is follow-up with EP later this month Previously on amiodarone  200 daily Currently taking  carvedilol  Off warfarin, taking Eliquis   Sick sinus syndrome, pacer Followed by EP, pacer working well Prior history atrial flutter, appears to be maintaining normal sinus rhythm  Hyperlipidemia, unspecified hyperlipidemia type On statin, lipitior 20 daily  Acute on chronic diastolic CHF (congestive  heart failure) (HCC) euvolemic , no leg swelling Creatinine 3.3, stable Tolerating torsemide  60 daily  Controlled type 2 diabetes mellitus without complication, without long-term current use of insulin  (HCC) A1C <7.0 improved  DYSPNEA Recommend she continue regular exercise program   anemia Stable, history of iron deficiency anemia  Orders Placed This Encounter  Procedures   EKG 12-Lead     Signed, Velinda Lunger, M.D., Ph.D. 12/05/2024  [facility] [place], [place] [phone]

## 2024-12-05 ENCOUNTER — Ambulatory Visit: Attending: Cardiovascular Disease | Admitting: Cardiovascular Disease

## 2024-12-05 ENCOUNTER — Encounter: Payer: Self-pay | Admitting: Cardiovascular Disease

## 2024-12-05 VITALS — BP 120/70 | HR 87 | Ht 64.0 in | Wt 217.4 lb

## 2024-12-05 DIAGNOSIS — I10 Essential (primary) hypertension: Secondary | ICD-10-CM

## 2024-12-05 DIAGNOSIS — I272 Pulmonary hypertension, unspecified: Secondary | ICD-10-CM

## 2024-12-05 DIAGNOSIS — I480 Paroxysmal atrial fibrillation: Secondary | ICD-10-CM

## 2024-12-05 DIAGNOSIS — E782 Mixed hyperlipidemia: Secondary | ICD-10-CM

## 2024-12-05 DIAGNOSIS — I5032 Chronic diastolic (congestive) heart failure: Secondary | ICD-10-CM | POA: Diagnosis not present

## 2024-12-05 MED ORDER — TORSEMIDE 20 MG PO TABS: 60.0000 mg | ORAL_TABLET | Freq: Every day | ORAL | 3 refills | Status: AC

## 2024-12-05 MED ORDER — CARVEDILOL 12.5 MG PO TABS: 12.5000 mg | ORAL_TABLET | Freq: Two times a day (BID) | ORAL | 3 refills | Status: AC

## 2024-12-05 NOTE — Patient Instructions (Signed)

## 2024-12-08 ENCOUNTER — Encounter: Payer: Self-pay | Admitting: Podiatry

## 2024-12-08 ENCOUNTER — Ambulatory Visit: Admitting: Podiatry

## 2024-12-08 DIAGNOSIS — M79674 Pain in right toe(s): Secondary | ICD-10-CM

## 2024-12-08 DIAGNOSIS — E119 Type 2 diabetes mellitus without complications: Secondary | ICD-10-CM

## 2024-12-08 DIAGNOSIS — L989 Disorder of the skin and subcutaneous tissue, unspecified: Secondary | ICD-10-CM | POA: Diagnosis not present

## 2024-12-08 DIAGNOSIS — B351 Tinea unguium: Secondary | ICD-10-CM | POA: Diagnosis not present

## 2024-12-08 DIAGNOSIS — M79675 Pain in left toe(s): Secondary | ICD-10-CM

## 2024-12-08 NOTE — Progress Notes (Signed)
 "  Subjective:  Patient ID: [name] [name], female    DOB: 1935-08-18,  MRN: [id number]  [name] [name] presents to clinic today for preventative diabetic foot care for painful thick toenails that are difficult to trim. Pain interferes with ambulation. Aggravating factors include wearing enclosed shoe gear. Pain is relieved with periodic professional debridement.  Chief Complaint  Patient presents with   Diabetes    A1c 6.5 by report. She Saw Dr. Glendia two weeks ago   New problem(s): None.   PCP is Glendia Shad, MD.  Allergies[1]  Review of Systems: Negative except as noted in the HPI.  Objective:  There were no vitals filed for this visit. [name] is a pleasant 89 y.o. female in NAD. AAO x 3.  Vascular Examination: Capillary refill time immediate b/l. Palpable pedal pulses. Pedal hair present b/l. Trace edema b/l. No pain with calf compression b/l. Skin temperature gradient WNL b/l. No cyanosis or clubbing b/l. No ischemia or gangrene noted b/l.   Neurological Examination: Sensation grossly intact b/l with 10 gram monofilament. Vibratory sensation intact b/l.  Dermatological Examination:    Macule noted plantarmedial heel right foot measuring 0.5 x 1.0, browinish in color with irregular borders, no palpable depth, no elevation, no bleeding.  Pedal skin with normal turgor, texture and tone b/l.  No open wounds. No interdigital macerations.   Toenails 1-5 b/l thick, discolored, elongated with subungual debris and pain on dorsal palpation.   No hyperkeratotic nor porokeratotic lesions.  Musculoskeletal Examination: Muscle strength 5/5 to all lower extremity muscle groups bilaterally. No pain, crepitus or joint limitation noted with ROM b/l LE. Pes planus deformity noted bilateral LE.SABRA Patient ambulates with cane assistance.  Radiographs: None  Assessment/Plan: 1. Pain due to onychomycosis of toenails of both feet   2. Controlled type 2 diabetes mellitus without  complication, without long-term current use of insulin  (HCC)    Orders Placed This Encounter  Procedures   Ambulatory referral to Dermatology    Referral Priority:   Routine    Referral Type:   Consultation    Referral Reason:   Specialty Services Required    Requested Specialty:   Dermatology    Number of Visits Requested:   1  Consent given for treatment. Patient examined. All patient's and/or POA's questions/concerns addressed on today's visit. Mycotic toenails 1-5 b/l debrided in length and girth without incident. Continue foot and shoe inspections daily. Monitor blood glucose per PCP/Endocrinologist's recommendations.Continue soft, supportive shoe gear daily. Report any pedal injuries to medical professional. Call office if there are any quesitons/concerns. -Patient/POA to call should there be question/concern in the interim.   Return in about 3 months (around 03/08/2025).  Delon LITTIE Merlin, DPM      [place] LOCATION: [address], [place] [address]                   Office [phone]   [place] LOCATION: [address] [place], [place] [address] Office [phone]      [1]  Allergies Allergen Reactions   Metoprolol Other (See Comments)    Bad dreams Patient stated that she had suicidal thoughts while taking this medication.    Diltiazem  Other (  See Comments)    Patient stated that she had bad dreams with this medication   Levofloxacin Nausea And Vomiting and Nausea Only        "

## 2024-12-22 ENCOUNTER — Ambulatory Visit: Payer: Self-pay

## 2024-12-22 DIAGNOSIS — I480 Paroxysmal atrial fibrillation: Secondary | ICD-10-CM

## 2024-12-23 LAB — CUP PACEART REMOTE DEVICE CHECK
Battery Impedance: 1825 Ohm
Battery Remaining Longevity: 42 mo
Battery Voltage: 2.76 V
Brady Statistic RV Percent Paced: 100 %
Date Time Interrogation Session: 20260119084608
Implantable Lead Connection Status: 753985
Implantable Lead Connection Status: 753985
Implantable Lead Implant Date: 20080117
Implantable Lead Implant Date: 20080117
Implantable Lead Location: 753859
Implantable Lead Location: 753860
Implantable Lead Model: 5076
Implantable Lead Model: 5076
Implantable Pulse Generator Implant Date: 20141229
Lead Channel Impedance Value: 615 Ohm
Lead Channel Impedance Value: 67 Ohm
Lead Channel Pacing Threshold Amplitude: 0.875 V
Lead Channel Pacing Threshold Pulse Width: 0.4 ms
Lead Channel Setting Pacing Amplitude: 2.5 V
Lead Channel Setting Pacing Pulse Width: 0.4 ms
Lead Channel Setting Sensing Sensitivity: 4 mV
Zone Setting Status: 755011
Zone Setting Status: 755011

## 2024-12-25 ENCOUNTER — Ambulatory Visit

## 2024-12-26 NOTE — Progress Notes (Signed)
 Remote PPM Transmission

## 2024-12-28 ENCOUNTER — Ambulatory Visit: Payer: Self-pay | Admitting: Cardiology

## 2024-12-29 ENCOUNTER — Other Ambulatory Visit

## 2024-12-29 ENCOUNTER — Ambulatory Visit: Admitting: Oncology

## 2025-01-02 ENCOUNTER — Ambulatory Visit: Admitting: Cardiology

## 2025-01-06 ENCOUNTER — Telehealth: Payer: Self-pay | Admitting: Cardiology

## 2025-01-06 NOTE — Telephone Encounter (Signed)
 Called to confirm/remind patient of their appointment at the [facility] on 01/07/25.   Appointment:   [x] Confirmed  [] Left mess   [] No answer/No voice mail  [] VM Full/unable to leave message  [] Phone not in service  Patient reminded to bring all medications and/or complete list.  Confirmed patient has transportation. Gave directions, instructed to utilize valet parking.

## 2025-01-07 ENCOUNTER — Ambulatory Visit: Attending: Cardiology | Admitting: Cardiology

## 2025-01-07 ENCOUNTER — Encounter: Payer: Self-pay | Admitting: Cardiology

## 2025-01-07 VITALS — BP 140/72 | HR 84 | Wt 215.5 lb

## 2025-01-07 DIAGNOSIS — Z79899 Other long term (current) drug therapy: Secondary | ICD-10-CM | POA: Diagnosis not present

## 2025-01-07 DIAGNOSIS — Z7901 Long term (current) use of anticoagulants: Secondary | ICD-10-CM | POA: Insufficient documentation

## 2025-01-07 DIAGNOSIS — I480 Paroxysmal atrial fibrillation: Secondary | ICD-10-CM | POA: Diagnosis not present

## 2025-01-07 DIAGNOSIS — I5032 Chronic diastolic (congestive) heart failure: Secondary | ICD-10-CM | POA: Insufficient documentation

## 2025-01-07 DIAGNOSIS — G4733 Obstructive sleep apnea (adult) (pediatric): Secondary | ICD-10-CM | POA: Insufficient documentation

## 2025-01-07 DIAGNOSIS — I130 Hypertensive heart and chronic kidney disease with heart failure and stage 1 through stage 4 chronic kidney disease, or unspecified chronic kidney disease: Secondary | ICD-10-CM | POA: Insufficient documentation

## 2025-01-07 DIAGNOSIS — D631 Anemia in chronic kidney disease: Secondary | ICD-10-CM | POA: Insufficient documentation

## 2025-01-07 DIAGNOSIS — Z950 Presence of cardiac pacemaker: Secondary | ICD-10-CM | POA: Insufficient documentation

## 2025-01-07 DIAGNOSIS — N184 Chronic kidney disease, stage 4 (severe): Secondary | ICD-10-CM | POA: Insufficient documentation

## 2025-01-07 DIAGNOSIS — Z7984 Long term (current) use of oral hypoglycemic drugs: Secondary | ICD-10-CM | POA: Insufficient documentation

## 2025-01-07 DIAGNOSIS — I495 Sick sinus syndrome: Secondary | ICD-10-CM | POA: Insufficient documentation

## 2025-01-07 NOTE — Patient Instructions (Addendum)
 Medication Changes:  No medication changes today!  Lab Work:  Go downstairs to [facility] on [address] to have your blood work completed.  We will only call you if the results are abnormal or if the provider would like to make medication changes.  No news is good news.   Follow-Up in: Please follow up with the [facility] in 6 months with Dr. Mclean. We do not currently have that schedule. Please give us  a call in July in order to schedule your follow up appointment for August 2026.   Thank you for choosing  [facility].    At the [facility], you and your health needs are our priority. We have a designated team specialized in the treatment of Heart Failure. This Care Team includes your primary Heart Failure Specialized Cardiologist (physician), Advanced Practice Providers (APPs- Physician Assistants and Nurse Practitioners), and Pharmacist who all work together to provide you with the care you need, when you need it.   You may see any of the following providers on your designated Care Team at your next follow up:  Dr. Toribio Fuel Dr. Ezra Shuck Dr. Ria Commander Dr. Morene Brownie Ellouise Class, FNP Jaun Bash, RPH-CPP  Please be sure to bring in all your medications bottles to every appointment.   Need to Contact Us :  If you have any questions or concerns before your next appointment please send us  a message through [place] or call our office at [phone].    TO LEAVE A MESSAGE FOR THE NURSE SELECT OPTION 2, PLEASE LEAVE A MESSAGE INCLUDING: YOUR NAME DATE OF BIRTH CALL BACK NUMBER REASON FOR CALL**this is important as we prioritize the call backs  YOU WILL RECEIVE A CALL BACK THE SAME DAY AS LONG AS YOU CALL BEFORE 4:00 PM

## 2025-01-07 NOTE — Progress Notes (Signed)
 "  ADVANCED HEART FAILURE CLINIC NOTE  Primary Care: Glendia Shad, MD HF Cardiology: Dr. Rolan  Chief complaint: CHF  HPI: [name] [name] is a 89 y.o. female with HFpEF, paroxysmal atrial fibrillation, sick sinus syndrome status Medtronic post permanent pacemaker, hypertension, OSA, CKD stage IV presenting today for follow up.  She establishd care with heart failure service in November 2024 when she was admitted with dyspnea on exertion and chest pressure.  At that time patient was found to be in atrial fibrillation with volume overload.  Echocardiogram during admission showed EF of 55 to 60% but with severely elevated RVSP.  Patient was aggressively diuresed and underwent right heart catheterization which demonstrated preserved cardiac index and PA mean of 28 mmHg.  She underwent cardioversion on 12/13/23.  Echo in 11/25 showed EF 55-60%, low normal RV function with mild RV dilation, PASP 63 mmHg.   She has been doing well recently. She is using CPAP nightly.  Weight down 1 lb.  BP is mildly elevated but generally runs SBP 120s-130s at home when she checks.  No exertional dyspnea or chest pain with her usual activities.  No orthopnea/PND.  No lightheadedness unless she bends over then stands back up.  No palpitations.  She RV paces 100% of the time.   ECG (personally reviewed): Possible underlying atrial fibrillation with RV pacing  Labs (9/25): LDL 70 Labs (12/25): K 3.7, creatinine 3.35, hgb 16.6  PMH: 1. Sick sinus syndrome: Medtronic PPM, paces RV 100% of the time.  2. Atrial fibrillation 3. Chronic diastolic CHF: Echo in 11/25 showed EF 55-60%, low normal RV function with mild RV dilation, PASP 63 mmHg.  4. HTN 5. OSA: uses CPAP.  6. CKD stage 4 7. Type 2 diabetes 8. Anemia  Family History  Problem Relation Age of Onset   Cancer Mother    Liver cancer Mother    Pancreatic cancer Mother    Heart disease Father    Rheum arthritis Father    Allergies Father    Hypertension  Sister    Rheum arthritis Sister    COPD Brother    Breast cancer Maternal Aunt    Social History   Socioeconomic History   Marital status: Widowed    Spouse name: Not on file   Number of children: 2   Years of education: Not on file   Highest education level: 12th grade  Occupational History   Occupation: retired  Tobacco Use   Smoking status: Former    Current packs/day: 0.00    Average packs/day: 0.5 packs/day for 34.0 years (17.0 ttl pk-yrs)    Types: Cigarettes    Start date: 08/16/1955    Quit date: 08/15/1989    Years since quitting: 35.4   Smokeless tobacco: Never  Vaping Use   Vaping status: Never Used  Substance and Sexual Activity   Alcohol use: No    Alcohol/week: 0.0 standard drinks of alcohol   Drug use: No   Sexual activity: Never  Other Topics Concern   Not on file  Social History Narrative   Retired. Widowed. Regularly exercises.    Social Drivers of Health   Tobacco Use: Medium Risk (01/07/2025)   Patient History    Smoking Tobacco Use: Former    Smokeless Tobacco Use: Never    Passive Exposure: Not on file  Financial Resource Strain: Low Risk (01/07/2024)   Overall Financial Resource Strain (CARDIA)    Difficulty of Paying Living Expenses: Not hard at all  Food Insecurity:  No Food Insecurity (01/07/2024)   Hunger Vital Sign    Worried About Running Out of Food in the Last Year: Never true    Ran Out of Food in the Last Year: Never true  Transportation Needs: No Transportation Needs (01/07/2024)   PRAPARE - Administrator, Civil Service (Medical): No    Lack of Transportation (Non-Medical): No  Physical Activity: Sufficiently Active (01/07/2024)   Exercise Vital Sign    Days of Exercise per Week: 3 days    Minutes of Exercise per Session: 60 min  Stress: No Stress Concern Present (01/07/2024)   [employer] of Occupational Health - Occupational Stress Questionnaire    Feeling of Stress : Not at all  Social Connections: Moderately  Integrated (01/07/2024)   Social Connection and Isolation Panel    Frequency of Communication with Friends and Family: More than three times a week    Frequency of Social Gatherings with Friends and Family: More than three times a week    Attends Religious Services: More than 4 times per year    Active Member of [employer] or Organizations: Yes    Attends Banker Meetings: More than 4 times per year    Marital Status: Widowed  Intimate Partner Violence: Not At Risk (01/07/2024)   Humiliation, Afraid, Rape, and Kick questionnaire    Fear of Current or Ex-Partner: No    Emotionally Abused: No    Physically Abused: No    Sexually Abused: No  Depression (PHQ2-9): Medium Risk (05/27/2024)   Depression (PHQ2-9)    PHQ-2 Score: 9  Alcohol Screen: Low Risk (01/07/2024)   Alcohol Screen    Last Alcohol Screening Score (AUDIT): 0  Housing: Unknown (01/07/2024)   Housing Stability Vital Sign    Unable to Pay for Housing in the Last Year: No    Number of Times Moved in the Last Year: Not on file    Homeless in the Last Year: No  Utilities: Not At Risk (01/07/2024)   [facility] Utilities    Threatened with loss of utilities: No  Health Literacy: Adequate Health Literacy (01/07/2024)   B1300 Health Literacy    Frequency of need for help with medical instructions: Never   ROS: All systems reviewed and negative except as per HPI.   Current Outpatient Medications  Medication Sig Dispense Refill   acetaminophen  (TYLENOL ) 500 MG tablet Take 1,000 mg by mouth every 6 (six) hours as needed for moderate pain.      allopurinol  (ZYLOPRIM ) 100 MG tablet Take 1 tablet (100 mg total) by mouth daily. 90 tablet 3   apixaban  (ELIQUIS ) 2.5 MG TABS tablet Take 1 tablet (2.5 mg total) by mouth 2 (two) times daily. 90 tablet 3   atorvastatin  (LIPITOR) 20 MG tablet Take 1 tablet (20 mg total) by mouth daily. 90 tablet 3   Blood Glucose Monitoring Suppl (FIFTY50 GLUCOSE METER 2.0) w/Device KIT      calcitRIOL  (ROCALTROL )  0.25 MCG capsule Take 0.25 mcg by mouth. Take 1 capsule (0.25 mcg total) by mouth 3 times weekly: Mon Wed and Friday morning     carvedilol  (COREG ) 12.5 MG tablet Take 1 tablet (12.5 mg total) by mouth 2 (two) times daily with a meal. 180 tablet 3   ferrous sulfate  325 (65 FE) MG EC tablet Take 325 mg by mouth daily.      glipiZIDE  (GLUCOTROL ) 10 MG tablet Take 5 mg by mouth daily.     Glycerin , Laxative, (FLEET LIQUID GLYCERIN  SUPP  RE) Place 1 Dose rectally daily as needed (constipation).     mometasone  (ELOCON ) 0.1 % cream Apply to affected area daily prn. 30 g 0   mometasone  (ELOCON ) 0.1 % cream Apply topically.     Multiple Vitamins-Minerals (CENTRUM SILVER PO) Take 1 tablet by mouth daily.      OneTouch Delica Lancets 33G MISC      ONETOUCH VERIO test strip ONCE DAILY USE TO TEST BLOOD SUGAR ONCE DAILY E11.22 100 strip 4   pantoprazole  (PROTONIX ) 40 MG tablet Take 1 tablet (40 mg total) by mouth daily. 90 tablet 3   polyethylene glycol (MIRALAX  / GLYCOLAX ) packet Take 17 g by mouth daily as needed for moderate constipation.     sitaGLIPtin  (JANUVIA ) 25 MG tablet Take 1 tablet (25 mg total) by mouth daily. 90 tablet 3   torsemide  (DEMADEX ) 20 MG tablet Take 3 tablets (60 mg total) by mouth daily. 270 tablet 3   White Petrolatum-Mineral Oil (ARTIFICIAL TEARS) ointment Place 1 drop into both eyes daily as needed (Dry eyes).     No current facility-administered medications for this visit.   PHYSICAL EXAM:  Wt Readings from Last 3 Encounters:  01/07/25 215 lb 8 oz (97.8 kg)  12/05/24 217 lb 6 oz (98.6 kg)  11/20/24 215 lb (97.5 kg)   Vitals:   01/07/25 1305  BP: (!) 140/72  Pulse: 84  SpO2: 100%  General: NAD Neck: No JVD, no thyromegaly or thyroid  nodule.  Lungs: Clear to auscultation bilaterally with normal respiratory effort. CV: Nondisplaced PMI.  Heart regular S1/S2, no S3/S4, no murmur.  No peripheral edema.  No carotid bruit.  Normal pedal pulses.  Abdomen: Soft, nontender,  no hepatosplenomegaly, no distention.  Skin: Intact without lesions or rashes.  Neurologic: Alert and oriented x 3.  Psych: Normal affect. Extremities: No clubbing or cyanosis.  HEENT: Normal.   ASSESSMENT & PLAN:  1. Chronic diastolic CHF: Echo in 11/25 showed EF 55-60%, low normal RV function with mild RV dilation, PASP 63 mmHg.  NYHA class II.  She looks euvolemic on current diuretic regimen.  - Continue torsemide  60 mg daily.  BMET/BNP today.  - No SGLT2 inhibitor due to CKD stage IV.  2. HTN: BP mildly elevated here but well-controlled on home checks.   - Continue Coreg  12.5 mg bid.  - She is off losartan with CKD stage IV.  3. Atrial fibrillation: s/p DCCV 11/24, 1/25. HR is controlled/regular today but underlying rhythm may be atrial fibrillation with RV pacing. Given her minimal symptoms and uncertain amount of time in atrial fibrillation, I will not arrange for repeat DCCV.  - Continue apixaban  2.5 mg daily (age > 80, creatinine > 1.5).  4. CKD IV: BMET today.  5. OSA: Compliant with CPAP 6. Anemia: Check Fe/TIBC/ferritin and hgb.   Followup 6 months.   I spent 32 minutes reviewing records, interviewing/ examing patient and managing plan/ orders.   Ezra Shuck, MD  01/07/2025    "

## 2025-01-08 LAB — IRON AND TIBC
Iron Saturation: 20 % (ref 15–55)
Iron: 50 ug/dL (ref 27–139)
Total Iron Binding Capacity: 250 ug/dL (ref 250–450)
UIBC: 200 ug/dL (ref 118–369)

## 2025-01-08 LAB — BASIC METABOLIC PANEL WITH GFR
BUN/Creatinine Ratio: 15 (ref 12–28)
BUN: 53 mg/dL — ABNORMAL HIGH (ref 8–27)
CO2: 23 mmol/L (ref 20–29)
Calcium: 9.8 mg/dL (ref 8.7–10.3)
Chloride: 104 mmol/L (ref 96–106)
Creatinine, Ser: 3.6 mg/dL — ABNORMAL HIGH (ref 0.57–1.00)
Glucose: 144 mg/dL — ABNORMAL HIGH (ref 70–99)
Potassium: 3.9 mmol/L (ref 3.5–5.2)
Sodium: 144 mmol/L (ref 134–144)
eGFR: 12 mL/min/{1.73_m2} — ABNORMAL LOW

## 2025-01-08 LAB — CBC
Hematocrit: 30.8 % — ABNORMAL LOW (ref 34.0–46.6)
Hemoglobin: 10 g/dL — ABNORMAL LOW (ref 11.1–15.9)
MCH: 27.2 pg (ref 26.6–33.0)
MCHC: 32.5 g/dL (ref 31.5–35.7)
MCV: 84 fL (ref 79–97)
Platelets: 163 10*3/uL (ref 150–450)
RBC: 3.68 x10E6/uL — ABNORMAL LOW (ref 3.77–5.28)
RDW: 16.9 % — ABNORMAL HIGH (ref 11.7–15.4)
WBC: 7.6 10*3/uL (ref 3.4–10.8)

## 2025-01-08 LAB — FERRITIN: Ferritin: 185 ng/mL — ABNORMAL HIGH (ref 15–150)

## 2025-01-08 LAB — BRAIN NATRIURETIC PEPTIDE: BNP: 1027.6 pg/mL — ABNORMAL HIGH (ref 0.0–100.0)

## 2025-01-09 ENCOUNTER — Ambulatory Visit (HOSPITAL_COMMUNITY): Payer: Self-pay | Admitting: Cardiology

## 2025-01-12 ENCOUNTER — Ambulatory Visit: Payer: PPO

## 2025-01-14 ENCOUNTER — Inpatient Hospital Stay

## 2025-01-14 ENCOUNTER — Inpatient Hospital Stay: Admitting: Oncology

## 2025-01-20 ENCOUNTER — Ambulatory Visit: Admitting: Cardiology

## 2025-02-23 ENCOUNTER — Ambulatory Visit: Admitting: Internal Medicine

## 2025-03-12 ENCOUNTER — Ambulatory Visit: Admitting: Podiatry
